# Patient Record
Sex: Female | Born: 1995 | Race: White | Hispanic: Yes | Marital: Married | Smoking: Former smoker
Health system: Southern US, Community
[De-identification: ages and names within clinical notes are randomized; demographics above are authoritative.]

## PROBLEM LIST (undated history)

## (undated) ENCOUNTER — Inpatient Hospital Stay (HOSPITAL_COMMUNITY): Payer: Self-pay

## (undated) ENCOUNTER — Ambulatory Visit (HOSPITAL_COMMUNITY): Disposition: A | Discharge status: Home / Self Care | Payer: Medicaid Other

## (undated) DIAGNOSIS — F913 Oppositional defiant disorder: Secondary | ICD-10-CM

## (undated) DIAGNOSIS — F4310 Post-traumatic stress disorder, unspecified: Secondary | ICD-10-CM

## (undated) DIAGNOSIS — Z8759 Personal history of other complications of pregnancy, childbirth and the puerperium: Secondary | ICD-10-CM

## (undated) DIAGNOSIS — O139 Gestational [pregnancy-induced] hypertension without significant proteinuria, unspecified trimester: Secondary | ICD-10-CM

## (undated) DIAGNOSIS — F419 Anxiety disorder, unspecified: Secondary | ICD-10-CM

## (undated) DIAGNOSIS — F509 Eating disorder, unspecified: Secondary | ICD-10-CM

## (undated) DIAGNOSIS — N200 Calculus of kidney: Secondary | ICD-10-CM

## (undated) DIAGNOSIS — T7840XA Allergy, unspecified, initial encounter: Secondary | ICD-10-CM

## (undated) DIAGNOSIS — R569 Unspecified convulsions: Secondary | ICD-10-CM

## (undated) DIAGNOSIS — J45909 Unspecified asthma, uncomplicated: Secondary | ICD-10-CM

## (undated) DIAGNOSIS — N730 Acute parametritis and pelvic cellulitis: Secondary | ICD-10-CM

## (undated) DIAGNOSIS — F909 Attention-deficit hyperactivity disorder, unspecified type: Secondary | ICD-10-CM

## (undated) DIAGNOSIS — G40909 Epilepsy, unspecified, not intractable, without status epilepticus: Secondary | ICD-10-CM

## (undated) DIAGNOSIS — F988 Other specified behavioral and emotional disorders with onset usually occurring in childhood and adolescence: Secondary | ICD-10-CM

## (undated) DIAGNOSIS — G43909 Migraine, unspecified, not intractable, without status migrainosus: Secondary | ICD-10-CM

## (undated) DIAGNOSIS — R51 Headache: Secondary | ICD-10-CM

## (undated) HISTORY — DX: Epilepsy, unspecified, not intractable, without status epilepticus: G40.909

## (undated) HISTORY — DX: Gestational (pregnancy-induced) hypertension without significant proteinuria, unspecified trimester: O13.9

## (undated) HISTORY — DX: Personal history of other complications of pregnancy, childbirth and the puerperium: Z87.59

## (undated) HISTORY — PX: DILATION AND CURETTAGE OF UTERUS: SHX78

## (undated) HISTORY — PX: OTHER SURGICAL HISTORY: SHX169

## (undated) HISTORY — PX: TONSILLECTOMY AND ADENOIDECTOMY: SUR1326

---
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
## 2016-01-05 ENCOUNTER — Emergency Department (HOSPITAL_COMMUNITY)
Admission: EM | Admit: 2016-01-05 | Discharge: 2016-01-06 | Disposition: A | Discharge status: Home / Self Care | Payer: Medicaid Other | Attending: Emergency Medicine | Admitting: Emergency Medicine

## 2016-01-05 ENCOUNTER — Encounter (HOSPITAL_COMMUNITY): Payer: Self-pay | Admitting: *Deleted

## 2016-01-05 DIAGNOSIS — Z3202 Encounter for pregnancy test, result negative: Secondary | ICD-10-CM | POA: Diagnosis not present

## 2016-01-05 DIAGNOSIS — Z87891 Personal history of nicotine dependence: Secondary | ICD-10-CM | POA: Insufficient documentation

## 2016-01-05 DIAGNOSIS — R42 Dizziness and giddiness: Secondary | ICD-10-CM | POA: Diagnosis not present

## 2016-01-05 DIAGNOSIS — F909 Attention-deficit hyperactivity disorder, unspecified type: Secondary | ICD-10-CM | POA: Insufficient documentation

## 2016-01-05 DIAGNOSIS — R200 Anesthesia of skin: Secondary | ICD-10-CM | POA: Insufficient documentation

## 2016-01-05 DIAGNOSIS — Z79899 Other long term (current) drug therapy: Secondary | ICD-10-CM | POA: Insufficient documentation

## 2016-01-05 DIAGNOSIS — R4182 Altered mental status, unspecified: Secondary | ICD-10-CM | POA: Diagnosis present

## 2016-01-05 DIAGNOSIS — F419 Anxiety disorder, unspecified: Secondary | ICD-10-CM | POA: Insufficient documentation

## 2016-01-05 DIAGNOSIS — Z91040 Latex allergy status: Secondary | ICD-10-CM | POA: Insufficient documentation

## 2016-01-05 DIAGNOSIS — R51 Headache: Secondary | ICD-10-CM | POA: Insufficient documentation

## 2016-01-05 DIAGNOSIS — Z7951 Long term (current) use of inhaled steroids: Secondary | ICD-10-CM | POA: Diagnosis not present

## 2016-01-05 DIAGNOSIS — R569 Unspecified convulsions: Secondary | ICD-10-CM | POA: Diagnosis not present

## 2016-01-05 DIAGNOSIS — J45909 Unspecified asthma, uncomplicated: Secondary | ICD-10-CM | POA: Diagnosis not present

## 2016-01-05 LAB — COMPREHENSIVE METABOLIC PANEL
ALT: 23 U/L (ref 14–54)
AST: 17 U/L (ref 15–41)
Albumin: 3.7 g/dL (ref 3.5–5.0)
Alkaline Phosphatase: 72 U/L (ref 38–126)
Anion gap: 8 (ref 5–15)
BUN: 11 mg/dL (ref 6–20)
CO2: 23 mmol/L (ref 22–32)
Calcium: 9.2 mg/dL (ref 8.9–10.3)
Chloride: 110 mmol/L (ref 101–111)
Creatinine, Ser: 0.78 mg/dL (ref 0.44–1.00)
GFR calc Af Amer: >60 mL/min (ref >60)
GFR calc non Af Amer: >60 mL/min (ref >60)
Glucose, Bld: 108 mg/dL — ABNORMAL HIGH (ref 65–99)
Potassium: 3.2 mmol/L — ABNORMAL LOW (ref 3.5–5.1)
Sodium: 141 mmol/L (ref 135–145)
Total Bilirubin: 0.3 mg/dL (ref 0.3–1.2)
Total Protein: 6.6 g/dL (ref 6.5–8.1)

## 2016-01-05 LAB — CBC WITH DIFFERENTIAL/PLATELET
Basophils Absolute: 0 10*3/uL (ref 0.0–0.1)
Basophils Relative: 0 %
Eosinophils Absolute: 0.2 10*3/uL (ref 0.0–0.7)
Eosinophils Relative: 2 %
HCT: 39.1 % (ref 36.0–46.0)
Hemoglobin: 13.1 g/dL (ref 12.0–15.0)
Lymphocytes Relative: 42 %
Lymphs Abs: 4.2 10*3/uL — ABNORMAL HIGH (ref 0.7–4.0)
MCH: 29.8 pg (ref 26.0–34.0)
MCHC: 33.5 g/dL (ref 30.0–36.0)
MCV: 89.1 fL (ref 78.0–100.0)
Monocytes Absolute: 0.8 10*3/uL (ref 0.1–1.0)
Monocytes Relative: 8 %
Neutro Abs: 4.7 10*3/uL (ref 1.7–7.7)
Neutrophils Relative %: 48 %
Platelets: 259 10*3/uL (ref 150–400)
RBC: 4.39 MIL/uL (ref 3.87–5.11)
RDW: 12.1 % (ref 11.5–15.5)
WBC: 10 10*3/uL (ref 4.0–10.5)

## 2016-01-05 MED ORDER — LEVETIRACETAM IN NACL 1000 MG/100ML IV SOLN
1000.0000 mg | Freq: Once | INTRAVENOUS | Status: AC
  Administered 2016-01-06: 1000 mg via INTRAVENOUS
  Filled 2016-01-05: qty 100

## 2016-01-05 MED ORDER — ACETAMINOPHEN 500 MG PO TABS
1000.0000 mg | ORAL_TABLET | Freq: Once | ORAL | Status: AC
  Administered 2016-01-06: 1000 mg via ORAL
  Filled 2016-01-05: qty 2

## 2016-01-05 MED ORDER — SODIUM CHLORIDE 0.9 % IV BOLUS (SEPSIS)
1000.0000 mL | Freq: Once | INTRAVENOUS | Status: AC
  Administered 2016-01-05: 1000 mL via INTRAVENOUS

## 2016-01-05 NOTE — ED Provider Notes (Signed)
By signing my name below, I, Erin Krueger, attest that this documentation has been prepared under the direction and in the presence of [employer], DO. Electronically Signed: Linus Krueger, ED Scribe. 01/05/2016. 11:06 PM.  TIME SEEN: 11:33 PM  CHIEF COMPLAINT:  Chief Complaint  Patient presents with  . Altered Mental Status   HPI: HPI Comments: Erin Krueger here via EMS is a 20 y.o. female with a PMHx of asthma and seizure who presents to the Emergency Department for an evaluation of an altered mental status. Pt c/o a headache and left facial and leg numbness. She reports her numbness is normal after having a seizure. No weakness.  C/o diffuse throbbing mild headache and lightheadedness which is also normal for her. Pt states she sees a neurologist in [place] for her seizures. She states she had a total of 3 seizures this year. Pt reports she was taking an unknown dose of Keppra until she ran out of it 2 months ago. Pt lives with her sister, Erin Krueger and her boyfriend. Pt has been off of Wellbutrin for "a long time." Pt denies any fever, vomiting, diarrhea, nausea, cough. Pt denies  pregnancy. She is sexually active and uses protection. Pt has a Nexplanon that "ran out" in January 2017. States she is not driving.   ROS: See HPI Constitutional: no fever  Eyes: no drainage  ENT: no runny nose   Cardiovascular:  no chest pain  Resp: no SOB  GI: no vomiting GU: no dysuria Integumentary: no rash  Allergy: no hives  Musculoskeletal: no leg swelling  Neurological: no slurred speech ROS otherwise negative  PAST MEDICAL HISTORY/PAST SURGICAL HISTORY:  Past Medical History  Diagnosis Date  . ADHD (attention deficit hyperactivity disorder)   . PTSD (post-traumatic stress disorder)   . ODD (oppositional defiant disorder)   . Asthma   . Anxiety   . Allergy   . Eating disorder   . Headache(784.0)     MEDICATIONS:  Prior to Admission medications   Medication Sig Start Date  End Date Taking? Authorizing Provider  acetaminophen (TYLENOL) 500 MG tablet Take 1,500 mg by mouth every 6 (six) hours as needed for mild pain.    Historical Provider, MD  albuterol (PROVENTIL HFA;VENTOLIN HFA) 108 (90 BASE) MCG/ACT inhaler Inhale 2 puffs into the lungs every 6 (six) hours as needed for wheezing. Patient may resume home supply. 10/11/14   Gwyneth Sprout, MD  beclomethasone (QVAR) 80 MCG/ACT inhaler Inhale 1 puff into the lungs 2 (two) times daily. 10/11/14   Gwyneth Sprout, MD  buPROPion (WELLBUTRIN XL) 300 MG 24 hr tablet Take 1 tablet (300 mg total) by mouth daily. Patient not taking: Reported on 03/30/2015 10/11/14   Gwyneth Sprout, MD  cetirizine (ZYRTEC) 10 MG tablet Take 1 tablet (10 mg total) by mouth at bedtime. Patient not taking: Reported on 03/30/2015 10/11/14   Gwyneth Sprout, MD  EPINEPHrine 0.3 mg/0.3 mL IJ SOAJ injection Inject 0.3 mg into the muscle as needed (Bee Stings).    Historical Provider, MD  etodolac (LODINE) 500 MG tablet Take 1 tablet (500 mg total) by mouth every evening. Patient not taking: Reported on 03/30/2015 10/11/14   Gwyneth Sprout, MD  famotidine (PEPCID) 20 MG tablet Take 1 tablet (20 mg total) by mouth 2 (two) times daily. 03/26/15   Devoria Albe, MD  hydrocortisone cream 1 % Apply 1 application topically 2 (two) times daily as needed for itching.    Historical Provider, MD  hydrOXYzine (ATARAX/VISTARIL) 50 MG  tablet Take 2 tablets (100 mg total) by mouth at bedtime. Patient taking differently: Take 50 mg by mouth at bedtime.  10/11/14   Gwyneth Sprout, MD  ibuprofen (ADVIL,MOTRIN) 600 MG tablet Take 1 tablet (600 mg total) by mouth every 8 (eight) hours as needed. 03/31/15   Azalia Bilis, MD  paliperidone (INVEGA) 6 MG 24 hr tablet Take 1 tablet (6 mg total) by mouth daily. 10/11/14   Gwyneth Sprout, MD  predniSONE (DELTASONE) 20 MG tablet Take 3 po QD x 3d , then 2 po QD x 3d then 1 po QD x 3d 03/26/15   Devoria Albe, MD  topiramate (TOPAMAX)  100 MG tablet Take 2 tablets (200 mg total) by mouth at bedtime. 10/11/14   Gwyneth Sprout, MD    ALLERGIES:  Allergies  Allergen Reactions  . Bee Venom Anaphylaxis  . Latex Rash    SOCIAL HISTORY:  Social History  Substance Use Topics  . Smoking status: Former Smoker    Quit date: 07/21/2013  . Smokeless tobacco: Not on file  . Alcohol Use: Yes     Comment: 3-4 glassed vodka/beer    FAMILY HISTORY: History reviewed. No pertinent family history.  EXAM: BP 123/84 mmHg  Pulse 85  Temp(Src) 99 F (37.2 C) (Oral)  Resp 14  Ht  (1.676 m)  Wt 205 lb (92.987 kg)  BMI 33.10 kg/m2  SpO2 98%   CONSTITUTIONAL: Alert and oriented to person but not place and time and responds appropriately to questions. Well-appearing; well-nourished, afebrile and nontoxic HEAD: Normocephalic, atraumatic EYES: Conjunctivae clear, PERRL ENT: normal nose; no rhinorrhea; moist mucous membranes; pharynx without lesions noted, no dental injury NECK: Supple, no meningismus, no LAD, ecchymosis to the right neck. Pt states is a "hickey"; no midline spinal tenderness or step-off or deformity CARD: RRR; S1 and S2 appreciated; no murmurs, no clicks, no rubs, no gallops RESP: Normal chest excursion without splinting or tachypnea; breath sounds clear and equal bilaterally; no wheezes, no rhonchi, no rales, no hypoxia or respiratory distress, no increased work of breathing ABD/GI: Normal bowel sounds; non-distended; soft, non-tender, no rebound, no guarding; no midline spinal tenderness or step-off or deformity BACK:  The back appears normal and is non-tender to palpation, there is no CVA tenderness EXT: Normal ROM in all joints; non-tender to palpation; no edema; normal capillary refill; no cyanosis    SKIN: Normal color for age and race; warm, superficial laceration to bottom lip NEURO: Moves all extremities equally, reports decreased sensation in her left lower cheek and left leg compared to the right. No  focal weakness on exam. No dysarthria or aphasia. Cranial nerves II through XII otherwise intact. PSYCH: The patient's mood and manner are appropriate. Grooming and personal hygiene are appropriate.  MEDICAL DECISION MAKING: Patient here with possible seizure. There is no family or witnesses present. She does appear post ictal. Reports a history of seizures and she thinks that she was on Keppra previously. States that she is no longer taking this medication because she ran out of it. States she is followed by neurologist in [place] but cannot remember their name. Complains of headache, dizziness, left lower face and left leg numbness. States this is typical for her after her seizures. States she is not driving. No obvious sign of trauma on exam. She does have some bruising to the right neck but states that this is a "hickey". We'll obtain labs, urine. Will give dose of IV Keppra in the emergency department. We'll try to  get in touch with patient's family. She is not currently on any medications that would lower her seizure threshold. Reports she's been off Wellbutrin for several months.  ED PROGRESS:  2:00 AM  Pt's boyfriend now at bedside. He states that tonight she complained of lip swelling and filling her throat was swelling. He thought she could've been having an allergic reaction. He did not see any seizure-like activity. He states that they called EMS for allergic reaction. Patient states that she thinks she had a seizure. States that the numbness in her left face and leg is improving. Only has a mild amount of tingling in the left foot. She is now oriented 3 without complaints. There is no wheezing or rash on exam. No urticaria. No angioedema. No stridor and normal phonation. Swallowing her own secretions without any difficulty. Labs are unremarkable other than mildly low potassium at 3.2 which we will replace. Urine shows no infection and she is not pregnant. I recommend placing patient on Keppra  500 mg twice a day and having her follow-up with her neurologist. Maryclare Labrador give her outpatient urology follow-up. Have discussed with her that she should not be driving and she understands. She has been monitored for 3 hours in the emergency department and again I see no sign of allergic reaction. She has no recent infectious symptoms. I suspect that this was a seizure likely triggered by the fact that she is noncompliant with her antiepileptics. Discussed return precautions. She verbalized understanding and is comfortable with this plan.    EKG Interpretation  Date/Time:  Monday January 06 2016 00:21:38 EST Ventricular Rate:  89 PR Interval:  204 QRS Duration: 105 QT Interval:  360 QTC Calculation: 438 R Axis:   75 Text Interpretation:  Sinus rhythm Borderline prolonged PR interval Borderline Q waves in inferior leads No significant change since last tracing Confirmed by Cora Brierley,  DO, Ariyan Brisendine ([id number]) on 01/06/2016 1:14:31 AM        I personally performed the services described in this documentation, which was scribed in my presence. The recorded information has been reviewed and is accurate.   Layla Maw Daishon Chui, DO 01/06/16 0205

## 2016-01-05 NOTE — ED Notes (Signed)
Pt brought in by [facility] for c/o altered loc; pt was found sitting on bed, pt has bite mark to lip and states she has seizures but does not take any medications

## 2016-01-06 LAB — POC URINE PREG, ED: Preg Test, Ur: NEGATIVE

## 2016-01-06 LAB — URINALYSIS, ROUTINE W REFLEX MICROSCOPIC
Bilirubin Urine: NEGATIVE
Glucose, UA: NEGATIVE mg/dL
Hgb urine dipstick: NEGATIVE
Ketones, ur: NEGATIVE mg/dL
Leukocytes, UA: NEGATIVE
Nitrite: NEGATIVE
Protein, ur: NEGATIVE mg/dL
Specific Gravity, Urine: 1.02 (ref 1.005–1.030)
pH: 6.5 (ref 5.0–8.0)

## 2016-01-06 MED ORDER — LEVETIRACETAM 500 MG PO TABS: 500.0000 mg | ORAL_TABLET | Freq: Two times a day (BID) | ORAL | Status: DC

## 2016-01-06 MED ORDER — POTASSIUM CHLORIDE CRYS ER 20 MEQ PO TBCR
40.0000 meq | EXTENDED_RELEASE_TABLET | Freq: Once | ORAL | Status: AC
  Administered 2016-01-06: 40 meq via ORAL
  Filled 2016-01-06: qty 2

## 2016-01-06 NOTE — Discharge Instructions (Signed)
° °  No Driving!  Follow up with your neurologist. Make an appointment this week.   Seizure, Adult A seizure is abnormal electrical activity in the brain. Seizures usually last from 30 seconds to 2 minutes. There are various types of seizures. Before a seizure, you may have a warning sensation (aura) that a seizure is about to occur. An aura may include the following symptoms:   Fear or anxiety.  Nausea.  Feeling like the room is spinning (vertigo).  Vision changes, such as seeing flashing lights or spots. Common symptoms during a seizure include:  A change in attention or behavior (altered mental status).  Convulsions with rhythmic jerking movements.  Drooling.  Rapid eye movements.  Grunting.  Loss of bladder and bowel control.  Bitter taste in the mouth.  Tongue biting. After a seizure, you may feel confused and sleepy. You may also have an injury resulting from convulsions during the seizure. HOME CARE INSTRUCTIONS   If you are given medicines, take them exactly as prescribed by your health care provider.  Keep all follow-up appointments as directed by your health care provider.  Do not swim or drive or engage in risky activity during which a seizure could cause further injury to you or others until your health care provider says it is OK.  Get adequate rest.  Teach friends and family what to do if you have a seizure. They should:  Lay you on the ground to prevent a fall.  Put a cushion under your head.  Loosen any tight clothing around your neck.  Turn you on your side. If vomiting occurs, this helps keep your airway clear.  Stay with you until you recover.  Know whether or not you need emergency care. SEEK IMMEDIATE MEDICAL CARE IF:  The seizure lasts longer than 5 minutes.  The seizure is severe or you do not wake up immediately after the seizure.  You have an altered mental status after the seizure.  You are having more frequent or worsening  seizures. Someone should drive you to the emergency department or call local emergency services (911 in [place] MAKE SURE YOU:  Understand these instructions.  Will watch your condition.  Will get help right away if you are not doing well or get worse.   This information is not intended to replace advice given to you by your health care provider. Make sure you discuss any questions you have with your health care provider.   Document Released: 11/06/2000 Document Revised: 11/30/2014 Document Reviewed: 06/21/2013 Elsevier Interactive Patient Education [employer]

## 2016-01-07 ENCOUNTER — Encounter (HOSPITAL_COMMUNITY): Payer: Self-pay | Admitting: Emergency Medicine

## 2016-01-07 ENCOUNTER — Emergency Department (HOSPITAL_COMMUNITY): Payer: Medicaid Other

## 2016-01-07 ENCOUNTER — Observation Stay (HOSPITAL_COMMUNITY)
Admission: EM | Admit: 2016-01-07 | Discharge: 2016-01-09 | Disposition: A | Discharge status: Home / Self Care | Payer: Medicaid Other | Attending: Family Medicine | Admitting: Family Medicine

## 2016-01-07 DIAGNOSIS — F902 Attention-deficit hyperactivity disorder, combined type: Secondary | ICD-10-CM | POA: Diagnosis not present

## 2016-01-07 DIAGNOSIS — R51 Headache: Secondary | ICD-10-CM | POA: Insufficient documentation

## 2016-01-07 DIAGNOSIS — Z79899 Other long term (current) drug therapy: Secondary | ICD-10-CM | POA: Diagnosis not present

## 2016-01-07 DIAGNOSIS — R569 Unspecified convulsions: Principal | ICD-10-CM | POA: Insufficient documentation

## 2016-01-07 DIAGNOSIS — F912 Conduct disorder, adolescent-onset type: Secondary | ICD-10-CM | POA: Diagnosis not present

## 2016-01-07 DIAGNOSIS — F509 Eating disorder, unspecified: Secondary | ICD-10-CM | POA: Insufficient documentation

## 2016-01-07 DIAGNOSIS — Z87891 Personal history of nicotine dependence: Secondary | ICD-10-CM | POA: Diagnosis not present

## 2016-01-07 DIAGNOSIS — Z91040 Latex allergy status: Secondary | ICD-10-CM | POA: Diagnosis not present

## 2016-01-07 DIAGNOSIS — J45909 Unspecified asthma, uncomplicated: Secondary | ICD-10-CM | POA: Diagnosis not present

## 2016-01-07 DIAGNOSIS — F913 Oppositional defiant disorder: Secondary | ICD-10-CM | POA: Insufficient documentation

## 2016-01-07 DIAGNOSIS — G40919 Epilepsy, unspecified, intractable, without status epilepticus: Secondary | ICD-10-CM

## 2016-01-07 DIAGNOSIS — F419 Anxiety disorder, unspecified: Secondary | ICD-10-CM | POA: Diagnosis not present

## 2016-01-07 DIAGNOSIS — F4310 Post-traumatic stress disorder, unspecified: Secondary | ICD-10-CM | POA: Diagnosis present

## 2016-01-07 DIAGNOSIS — F909 Attention-deficit hyperactivity disorder, unspecified type: Secondary | ICD-10-CM | POA: Diagnosis not present

## 2016-01-07 DIAGNOSIS — G40909 Epilepsy, unspecified, not intractable, without status epilepticus: Secondary | ICD-10-CM | POA: Diagnosis not present

## 2016-01-07 DIAGNOSIS — Z9119 Patient's noncompliance with other medical treatment and regimen: Secondary | ICD-10-CM | POA: Diagnosis not present

## 2016-01-07 LAB — CBC WITH DIFFERENTIAL/PLATELET
Basophils Absolute: 0 10*3/uL (ref 0.0–0.1)
Basophils Relative: 0 %
Eosinophils Absolute: 0.3 10*3/uL (ref 0.0–0.7)
Eosinophils Relative: 3 %
HCT: 43.7 % (ref 36.0–46.0)
Hemoglobin: 14.4 g/dL (ref 12.0–15.0)
Lymphocytes Relative: 29 %
Lymphs Abs: 2.6 10*3/uL (ref 0.7–4.0)
MCH: 29.8 pg (ref 26.0–34.0)
MCHC: 33 g/dL (ref 30.0–36.0)
MCV: 90.3 fL (ref 78.0–100.0)
Monocytes Absolute: 0.7 10*3/uL (ref 0.1–1.0)
Monocytes Relative: 8 %
Neutro Abs: 5.4 10*3/uL (ref 1.7–7.7)
Neutrophils Relative %: 60 %
Platelets: 277 10*3/uL (ref 150–400)
RBC: 4.84 MIL/uL (ref 3.87–5.11)
RDW: 12.2 % (ref 11.5–15.5)
WBC: 9 10*3/uL (ref 4.0–10.5)

## 2016-01-07 LAB — BASIC METABOLIC PANEL
Anion gap: 9 (ref 5–15)
BUN: 12 mg/dL (ref 6–20)
CO2: 26 mmol/L (ref 22–32)
Calcium: 9.5 mg/dL (ref 8.9–10.3)
Chloride: 106 mmol/L (ref 101–111)
Creatinine, Ser: 0.67 mg/dL (ref 0.44–1.00)
GFR calc Af Amer: >60 mL/min (ref >60)
GFR calc non Af Amer: >60 mL/min (ref >60)
Glucose, Bld: 89 mg/dL (ref 65–99)
Potassium: 3.8 mmol/L (ref 3.5–5.1)
Sodium: 141 mmol/L (ref 135–145)

## 2016-01-07 LAB — ETHANOL: Alcohol, Ethyl (B): <5 mg/dL (ref <5)

## 2016-01-07 LAB — RAPID URINE DRUG SCREEN, HOSP PERFORMED
Amphetamines: NOT DETECTED
Barbiturates: NOT DETECTED
Benzodiazepines: NOT DETECTED
Cocaine: NOT DETECTED
Opiates: NOT DETECTED
Tetrahydrocannabinol: NOT DETECTED

## 2016-01-07 LAB — TSH: TSH: 0.512 u[IU]/mL (ref 0.350–4.500)

## 2016-01-07 IMAGING — CT CT HEAD W/O CM
1 of 2 series · 16 of 30 positions shown, 20 images · non-contrast
Comparison: [DATE]

CLINICAL DATA: Three seizures earlier today

EXAM:
CT HEAD WITHOUT CONTRAST
TECHNIQUE: Contiguous axial images were obtained from the base of the skull
through the vertex without intravenous contrast.

[Series 2: headseq 4.8 h37s · axial · 0.43mm/px · z∈[+256,+406]mm · 16 of 35 slices shown, 20 images]
[im 2/35  brain]
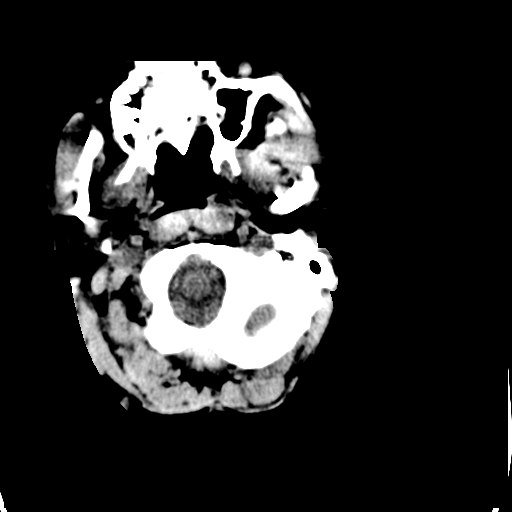
[im 2/35  bone]
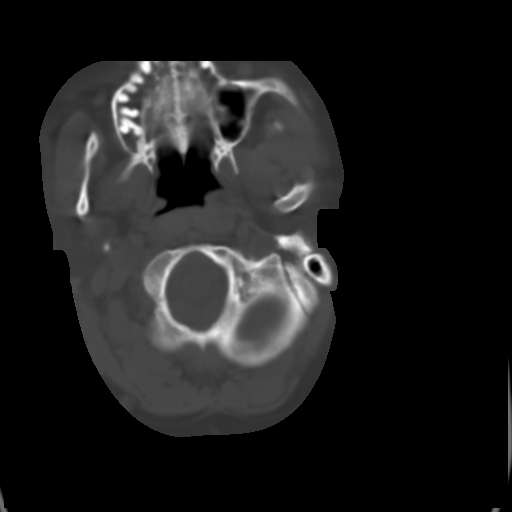
[im 4/35  brain]
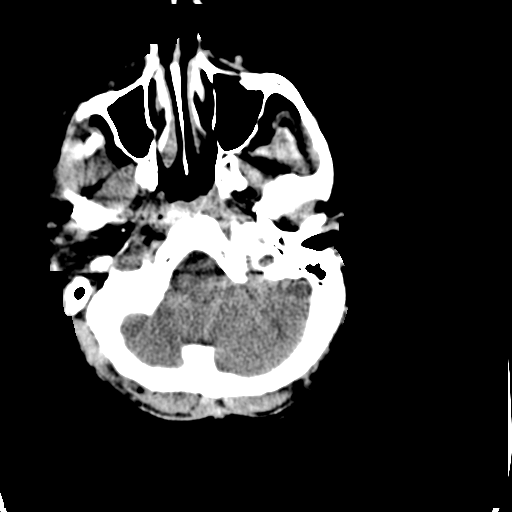
[im 6/35  brain]
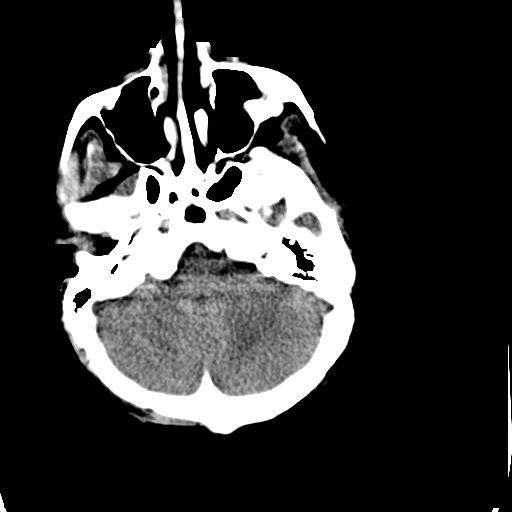
[im 8/35  brain]
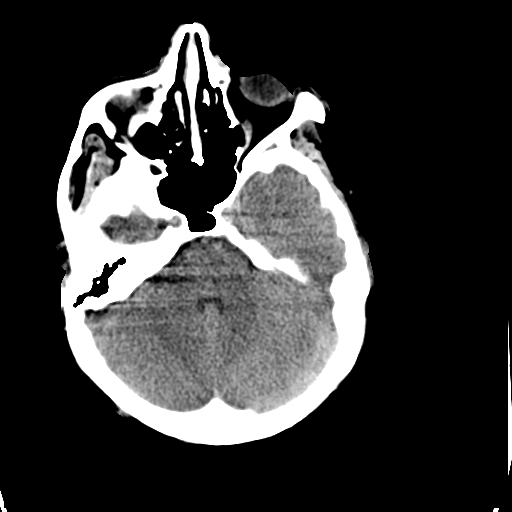
[im 11/35  brain]
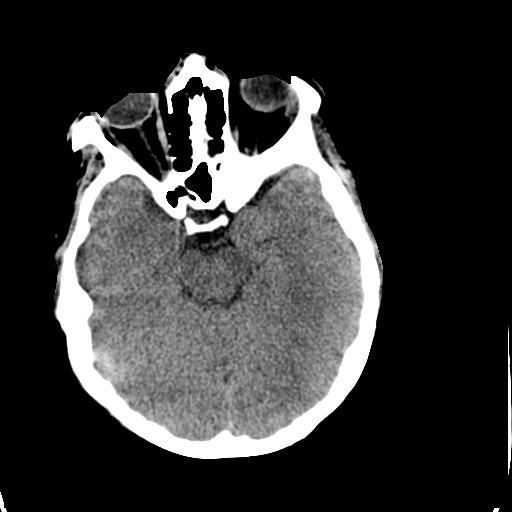
[im 11/35  bone]
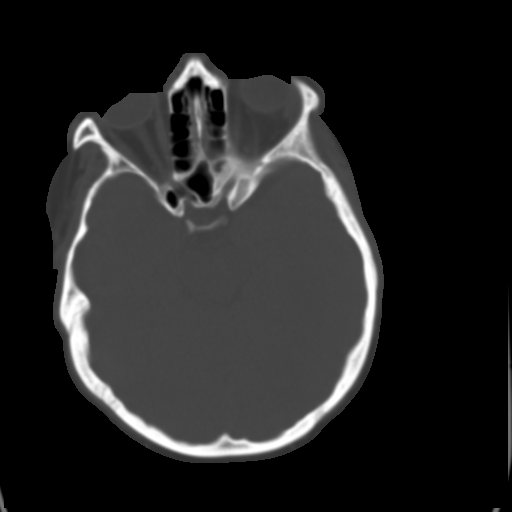
[im 13/35  brain]
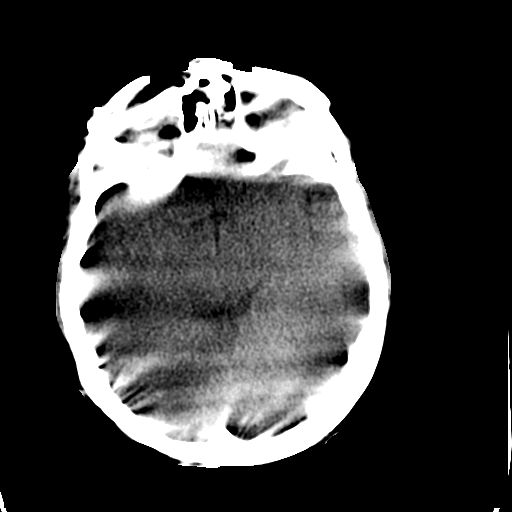
[im 15/35  brain]
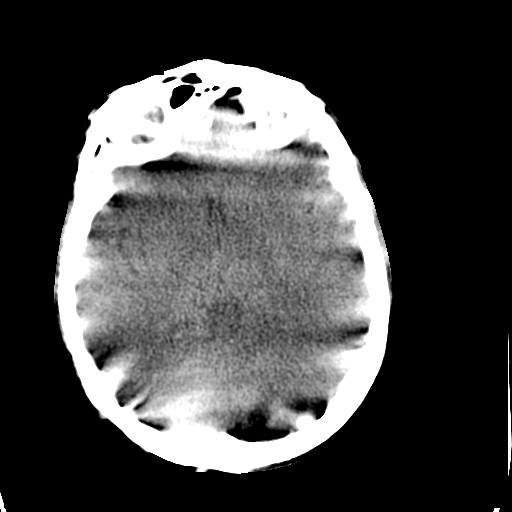
[im 17/35  brain]
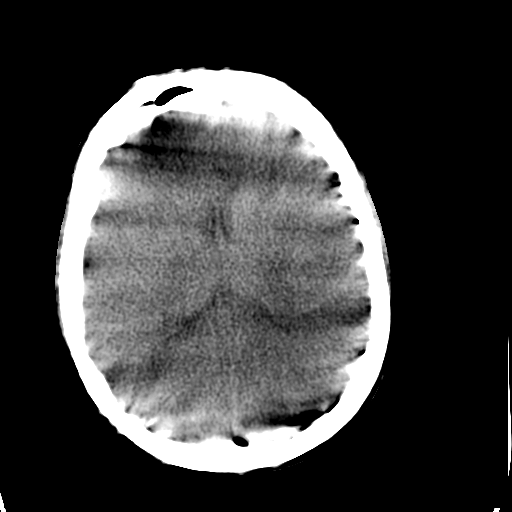
[im 18/35  brain]
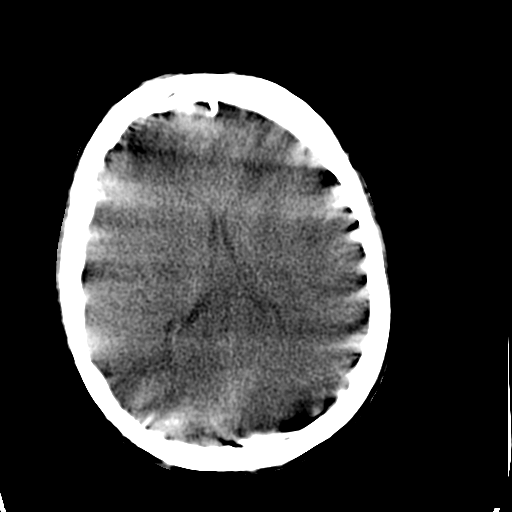
[im 18/35  bone]
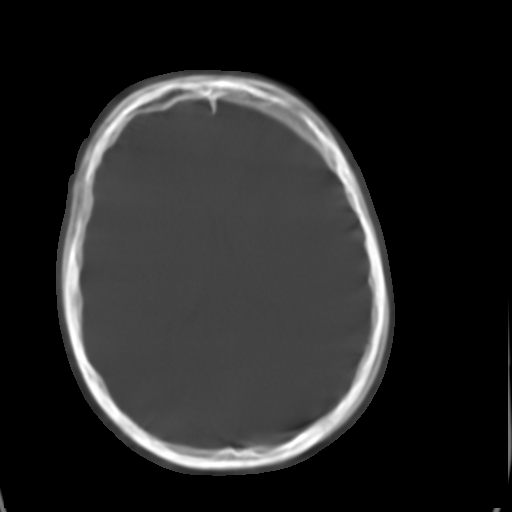
[im 20/35  brain]
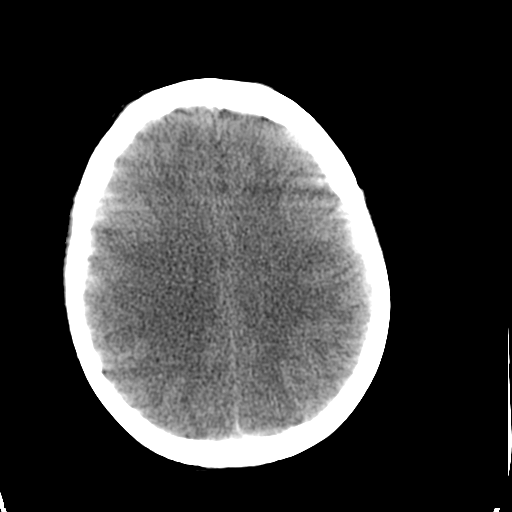
[im 22/35  brain]
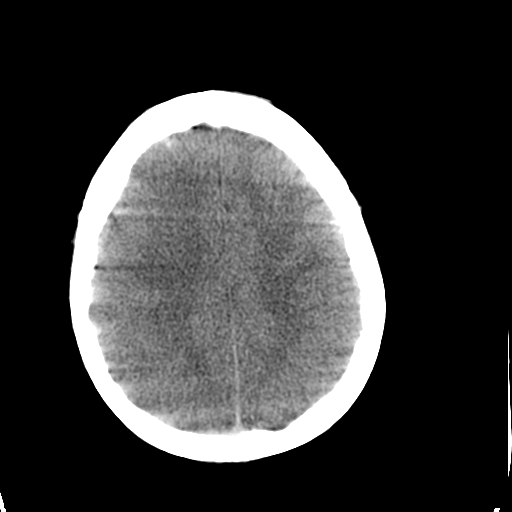
[im 24/35  brain]
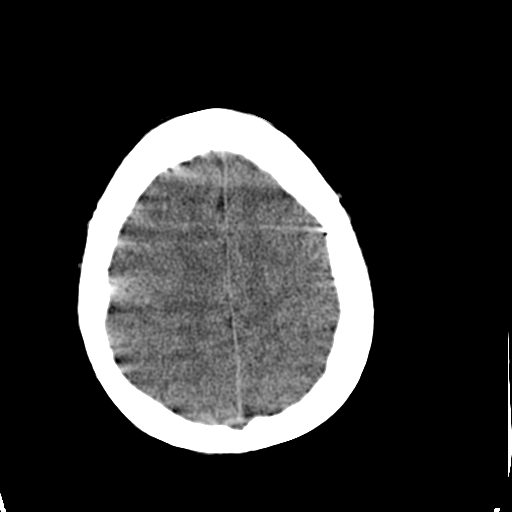
[im 27/35  brain]
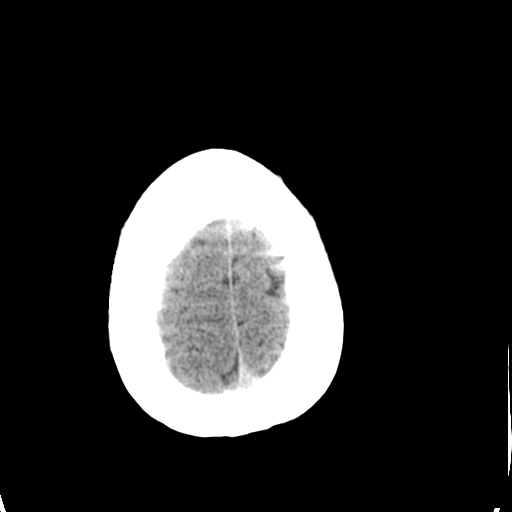
[im 27/35  bone]
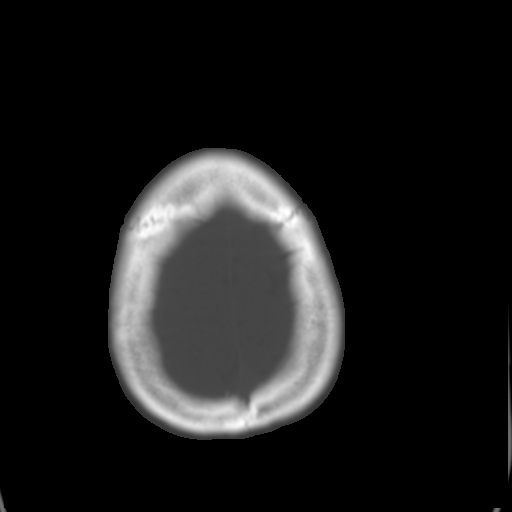
[im 29/35  brain]
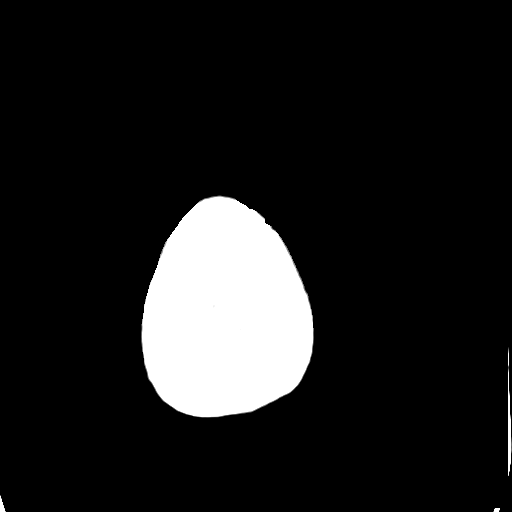
[im 31/35  brain]
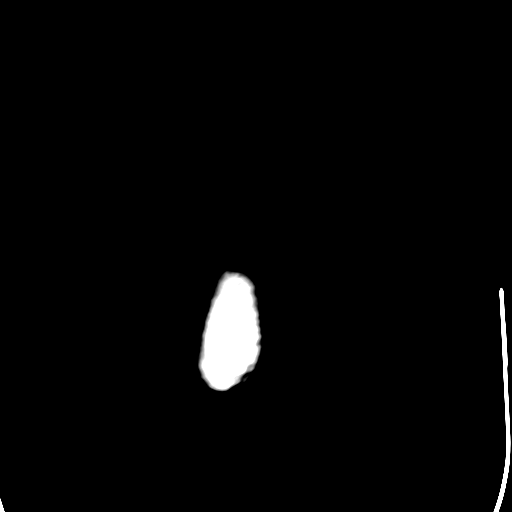
[im 33/35  brain]
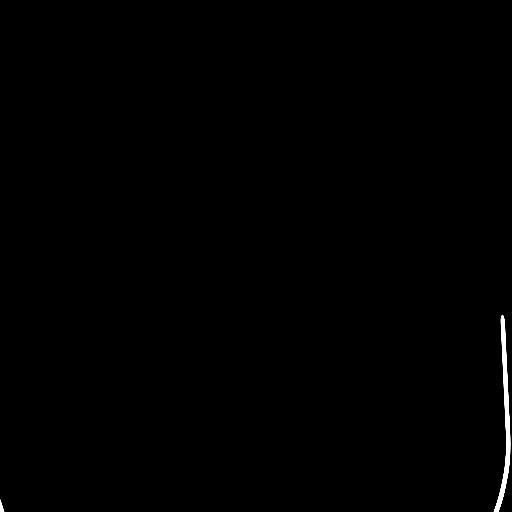

[16 of 30 positions shown; findings below may reference images not displayed]

FINDINGS: The ventricles are normal in size and configuration. There is no
intracranial mass, hemorrhage, extra-axial fluid collection, or
midline shift. Gray-white compartments appear normal. No acute
infarct evident. Bony calvarium appears intact. The mastoid air
cells are clear. There is a small retention cyst in each maxillary
antrum. No intraorbital lesions are apparent.
IMPRESSION: Mild maxillary sinus disease bilaterally. No intracranial mass,
hemorrhage, or extra-axial fluid collection. Gray-white compartments
appear normal. No evidence of acute infarct.

## 2016-01-07 MED ORDER — LORAZEPAM 2 MG/ML IJ SOLN
INTRAMUSCULAR | Status: AC
  Filled 2016-01-07: qty 1

## 2016-01-07 MED ORDER — BECLOMETHASONE DIPROPIONATE 80 MCG/ACT IN AERS: 1.0000 | INHALATION_SPRAY | Freq: Two times a day (BID) | RESPIRATORY_TRACT | Status: DC

## 2016-01-07 MED ORDER — SODIUM CHLORIDE 0.9 % IV BOLUS (SEPSIS)
1000.0000 mL | Freq: Once | INTRAVENOUS | Status: AC
  Administered 2016-01-07: 1000 mL via INTRAVENOUS

## 2016-01-07 MED ORDER — ENOXAPARIN SODIUM 40 MG/0.4ML ~~LOC~~ SOLN
40.0000 mg | SUBCUTANEOUS | Status: DC
  Filled 2016-01-07: qty 0.4

## 2016-01-07 MED ORDER — LEVETIRACETAM IN NACL 1000 MG/100ML IV SOLN
1000.0000 mg | Freq: Once | INTRAVENOUS | Status: AC
  Administered 2016-01-07: 1000 mg via INTRAVENOUS
  Filled 2016-01-07: qty 100

## 2016-01-07 MED ORDER — LEVETIRACETAM 500 MG PO TABS
500.0000 mg | ORAL_TABLET | Freq: Two times a day (BID) | ORAL | Status: DC
  Administered 2016-01-08 – 2016-01-09 (×3): 500 mg via ORAL
  Filled 2016-01-07 (×4): qty 1

## 2016-01-07 MED ORDER — SODIUM CHLORIDE 0.9 % IV SOLN
Freq: Once | INTRAVENOUS | Status: AC
  Administered 2016-01-07: 19:00:00 via INTRAVENOUS

## 2016-01-07 MED ORDER — ZIPRASIDONE MESYLATE 20 MG IM SOLR
10.0000 mg | Freq: Once | INTRAMUSCULAR | Status: AC
  Administered 2016-01-07: 10 mg via INTRAMUSCULAR
  Filled 2016-01-07: qty 20

## 2016-01-07 MED ORDER — LORAZEPAM 2 MG/ML IJ SOLN
2.0000 mg | Freq: Once | INTRAMUSCULAR | Status: AC
  Administered 2016-01-07: 2 mg via INTRAVENOUS

## 2016-01-07 MED ORDER — BUDESONIDE 0.25 MG/2ML IN SUSP
0.2500 mg | Freq: Two times a day (BID) | RESPIRATORY_TRACT | Status: DC
  Administered 2016-01-08 – 2016-01-09 (×3): 0.25 mg via RESPIRATORY_TRACT
  Filled 2016-01-07 (×4): qty 2

## 2016-01-07 MED ORDER — SODIUM CHLORIDE 0.9% FLUSH
3.0000 mL | Freq: Two times a day (BID) | INTRAVENOUS | Status: DC
  Administered 2016-01-08 (×2): 3 mL via INTRAVENOUS

## 2016-01-07 MED ORDER — STERILE WATER FOR INJECTION IJ SOLN
INTRAMUSCULAR | Status: AC
  Filled 2016-01-07: qty 10

## 2016-01-07 NOTE — ED Notes (Signed)
Asked patient for urine sample. Patient stated she did not have to urinate. Patient placed on bed pan. Patient pulled bed pan away and urinated in the bed.

## 2016-01-07 NOTE — ED Provider Notes (Addendum)
CSN: [id number]     Arrival date & time 01/07/16  1507 History   First MD Initiated Contact with Patient 01/07/16 1521     Chief Complaint  Patient presents with  . Seizures     (Consider location/radiation/quality/duration/timing/severity/associated sxs/prior Treatment) HPI..... Level V caveat for seizure disorder. Most of history obtained from sister. Second emergency department visit in the past 36 hours for "seizures". History is rather sketchy; sister reports a history of a seizure disorder and noncompliance with medications/doctor visits. She is presently on no anti-seizure meds. Patient had a tonic-clonic seizure just prior to transport via EMS. In the ED, she has had several other seizure-like movements with associated sleepiness after seizure. No reported prodromal illnesses. She has several mental health diagnoses.  Past Medical History  Diagnosis Date  . ADHD (attention deficit hyperactivity disorder)   . PTSD (post-traumatic stress disorder)   . ODD (oppositional defiant disorder)   . Asthma   . Anxiety   . Allergy   . Eating disorder   . [id number](478.2)    Past Surgical History  Procedure Laterality Date  . Tonsillectomy     History reviewed. No pertinent family history. Social History  Substance Use Topics  . Smoking status: Former Smoker    Quit date: 07/21/2013  . Smokeless tobacco: None  . Alcohol Use: Yes     Comment: 3-4 glassed vodka/beer   OB History    No data available     Review of Systems  All other systems reviewed and are negative.     Allergies  Bee venom and Latex  Home Medications   Prior to Admission medications   Medication Sig Start Date End Date Taking? Authorizing Provider  EPINEPHrine 0.3 mg/0.3 mL IJ SOAJ injection Inject 0.3 mg into the muscle as needed (Bee Stings).   Yes Historical Provider, MD  levETIRAcetam (KEPPRA) 500 MG tablet Take 1 tablet (500 mg total) by mouth 2 (two) times daily. 01/06/16  Yes Kristen N Ward, DO   acetaminophen (TYLENOL) 500 MG tablet Take 1 tablet (500 mg total) by mouth every 6 (six) hours as needed for mild pain. 01/09/16   Standley Brooking, MD  albuterol (PROVENTIL HFA;VENTOLIN HFA) 108 (90 Base) MCG/ACT inhaler Inhale 2 puffs into the lungs every 6 (six) hours as needed for wheezing. Patient may resume home supply. 01/09/16   Standley Brooking, MD  beclomethasone (QVAR) 80 MCG/ACT inhaler Inhale 1 puff into the lungs 2 (two) times daily. Patient not taking: Reported on 01/07/2016 10/11/14   Gwyneth Sprout, MD   BP 126/63 mmHg  Pulse 62  Temp(Src) 98.3 F (36.8 C) (Oral)  Resp 18  Ht  (1.676 m)  SpO2 98% Physical Exam  Constitutional:  Disheveled appearing  HENT:  Head: Normocephalic and atraumatic.  Eyes: Conjunctivae and EOM are normal. Pupils are equal, round, and reactive to light.  Neck: Normal range of motion. Neck supple.  Cardiovascular: Normal rate and regular rhythm.   Pulmonary/Chest: Effort normal and breath sounds normal.  Abdominal: Soft. Bowel sounds are normal.  Musculoskeletal: Normal range of motion.  Neurological:  No obvious seizures.  Patient is smacking her lips and talking nonsensically, but she immediately clears to normal mentation. No tonic-clonic activity noted.  Skin: Skin is warm and dry.  Psychiatric: She has a normal mood and affect. Her behavior is normal.  Nursing note and vitals reviewed.   ED Course  Procedures (including critical care time) Labs Review Labs Reviewed  BASIC METABOLIC PANEL  CBC WITH DIFFERENTIAL/PLATELET  ETHANOL  URINE RAPID DRUG SCREEN, HOSP PERFORMED  TSH    Imaging Review No results found. I have personally reviewed and evaluated these images and lab results as part of my medical decision-making.   EKG Interpretation   Date/Time:  Tuesday January 07 2016 15:23:18 EST Ventricular Rate:  86 PR Interval:  156 QRS Duration: 95 QT Interval:  352 QTC Calculation: 421 R Axis:   68 Text  Interpretation:  Sinus rhythm Confirmed by Kiyomi Pallo  MD, Damonica Chopra ([id number]) on  01/07/2016 8:06:14 PM     CRITICAL CARE Performed by: Donnetta Hutching Total critical care time: 30 minutes Critical care time was exclusive of separately billable procedures and treating other patients. Critical care was necessary to treat or prevent imminent or life-threatening deterioration. Critical care was time spent personally by me on the following activities: development of treatment plan with patient and/or surrogate as well as nursing, discussions with consultants, evaluation of patient's response to treatment, examination of patient, obtaining history from patient or surrogate, ordering and performing treatments and interventions, ordering and review of laboratory studies, ordering and reviSeizure versus pseudoseizure.ew of radiographic studies, pulse oximetry and re-evaluation of patient's condition. MDM   Final diagnoses:  Seizure (HCC)    Seizure versus pseudoseizure. Patient has several mental health diagnoses. Discussed with neurologist and hospitalist. Dr. Conley Rolls will evaluate.  2000:   Patient has attempted to leave the emergency department. I didn't think was safe for her to be discharged. I do not think she is thinking clearly. ? suicidal ideation. I filled out involuntary commitment papers. Patient will be admitted to general medicine. TSS consult pending.   Donnetta Hutching, MD 01/07/16 1844  Donnetta Hutching, MD 01/07/16 6045  Donnetta Hutching, MD 01/07/16 2009  Donnetta Hutching, MD 01/21/16 [phone]

## 2016-01-07 NOTE — ED Notes (Signed)
Patient arrives via EMS from home with c/o 3 witnessed seizures today. Patient arrives somnolent. EMS states constricted pupils and pushed Narcan. No seizure activity since narcan given.  Patient seen 01/05/16 for 1st seizure activity. Given Keppra to start. Unknown if she has been taking Keppra due to patient condition.

## 2016-01-07 NOTE — BH Assessment (Signed)
Writer spoke with Erin Krueger to request TTS cart placement for assessment. Writer was informed that pt is engaging in disruptive behaviors and unable to be assessed at this time. Writer requested consult be taken out and re-entered when pt is able to engage in assessment.

## 2016-01-07 NOTE — ED Notes (Signed)
Patient actively seizing at this time. Dr Adriana Simas at bedside. Verbal order for 2 mg Ativan IV.
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
## 2016-01-07 NOTE — ED Notes (Signed)
Patient present with child-like behavior after seizures, pulling off leads, pulling at IV saying "it hurts", I removed the IV for safety reasons.  Informed EDP Adriana Simas that patient was trying to leave, he was working on Agricultural consultant, Loss adjuster, chartered at bedside for consult.

## 2016-01-07 NOTE — ED Notes (Signed)
Pt with intermittent seizure like activity. Sister at bedside. MD aware. Patient guards her face. Opens eyes and gazes around after seizure like activity.

## 2016-01-07 NOTE — H&P (Signed)
[facility] History and Physical  Erin Krueger RUE:[id number] DOB: [birth year]    PCP:   No PCP Per Patient   Chief Complaint:  Break through seizure.   HPI: Erin Krueger is an 20 y.o. female with hx of seizure since childhood, ADHD, PTSD, ODD, asthma, anxiety, currently living with her pregnant step sister, seen on Feb 12/17 for several seizures similar to her previous seizures, given IV Keppra and discharged on oral keppra, but she was having more episodes of seizures.  Her seizures was witnessed in the ER, and those today did not result in having LOC or postictal, but her previous ones seemed to be more generalized TC seizures.  Work up in the ER included a head CT, which was negative, and negative serologies to include normal CBC, LFTs, renal Fx tests, and electrolytes.  She was again given IV Keppra load, and hospitalist was asked to admit her for further evaluation of her seizures.   She said she was taking another kind of seizure medication for a long time, but her neurologist had recently taken her off.  EDP also consulted Dr Gerilyn Pilgrim, who will see her tomorrow in consultation.  She was placed on IVC as well, given she is not capable of making decision, and she was attempting to leave.   Rewiew of Systems:  Constitutional: Negative for malaise, fever and chills. No significant weight loss or weight gain Eyes: Negative for eye pain, redness and discharge, diplopia, visual changes, or flashes of light. ENMT: Negative for ear pain, hoarseness, nasal congestion, sinus pressure and sore throat. No headaches; tinnitus, drooling, or problem swallowing. Cardiovascular: Negative for chest pain, palpitations, diaphoresis, dyspnea and peripheral edema. ; No orthopnea, PND Respiratory: Negative for cough, hemoptysis, wheezing and stridor. No pleuritic chestpain. Gastrointestinal: Negative for nausea, vomiting, diarrhea, constipation, abdominal pain, melena, blood in stool, hematemesis,  jaundice and rectal bleeding.    Genitourinary: Negative for frequency, dysuria, incontinence,flank pain and hematuria; Musculoskeletal: Negative for back pain and neck pain. Negative for swelling and trauma.;  Skin: . Negative for pruritus, rash, abrasions, bruising and skin lesion.; ulcerations Neuro: Negative for headache, lightheadedness and neck stiffness. Negative for weakness, altered level of consciousness , altered mental status, extremity weakness, burning feet, involuntary movement, seizure and syncope.  Psych: negative for anxiety, depression, insomnia, tearfulness, panic attacks, hallucinations, paranoia, suicidal or homicidal ideation    Past Medical History  Diagnosis Date  . ADHD (attention deficit hyperactivity disorder)   . PTSD (post-traumatic stress disorder)   . ODD (oppositional defiant disorder)   . Asthma   . Anxiety   . Allergy   . Eating disorder   . JYNWGNFA(213.0)     Past Surgical History  Procedure Laterality Date  . Tonsillectomy      Medications:  HOME MEDS: Prior to Admission medications   Medication Sig Start Date End Date Taking? Authorizing Provider  acetaminophen (TYLENOL) 500 MG tablet Take 1,000 mg by mouth every 6 (six) hours as needed for mild pain.    Yes Historical Provider, MD  albuterol (PROVENTIL HFA;VENTOLIN HFA) 108 (90 BASE) MCG/ACT inhaler Inhale 2 puffs into the lungs every 6 (six) hours as needed for wheezing. Patient may resume home supply. 10/11/14  Yes Gwyneth Sprout, MD  EPINEPHrine 0.3 mg/0.3 mL IJ SOAJ injection Inject 0.3 mg into the muscle as needed (Bee Stings).   Yes Historical Provider, MD  levETIRAcetam (KEPPRA) 500 MG tablet Take 1 tablet (500 mg total) by mouth 2 (two) times daily. 01/06/16  Yes Baxter Hire  N Ward, DO  beclomethasone (QVAR) 80 MCG/ACT inhaler Inhale 1 puff into the lungs 2 (two) times daily. Patient not taking: Reported on 01/07/2016 10/11/14   Gwyneth Sprout, MD  hydrOXYzine (ATARAX/VISTARIL) 50 MG  tablet Take 2 tablets (100 mg total) by mouth at bedtime. Patient not taking: Reported on 01/07/2016 10/11/14   Gwyneth Sprout, MD  paliperidone (INVEGA) 6 MG 24 hr tablet Take 1 tablet (6 mg total) by mouth daily. Patient not taking: Reported on 01/07/2016 10/11/14   Gwyneth Sprout, MD     Allergies:  Allergies  Allergen Reactions  . Bee Venom Anaphylaxis  . Latex Rash    Social History:   reports that she quit smoking about 2 years ago. She does not have any smokeless tobacco history on file. She reports that she drinks alcohol. She reports that she uses illicit drugs (Marijuana and Cocaine).  Family History: History reviewed. No pertinent family history.   Physical Exam: Filed Vitals:   01/07/16 1800 01/07/16 1830 01/07/16 1900 01/07/16 2003  BP:    112/72  Pulse:  94  94  Temp:      TempSrc:      Resp: SpO2:  99%  100%   Blood pressure 112/72, pulse 94, temperature 99.2 F (37.3 C), temperature source Oral, resp. rate 16, SpO2 100 %.  GEN:  Pleasant  patient lying in the stretcher in no acute distress; cooperative with exam. PSYCH:  alert and but confused.  does not appear anxious or depressed; affect is appropriate. HEENT: Mucous membranes pink and anicteric; PERRLA; EOM intact; no cervical lymphadenopathy nor thyromegaly or carotid bruit; no JVD; There were no stridor. Neck is very supple. Breasts:: Not examined CHEST WALL: No tenderness CHEST: Normal respiration, clear to auscultation bilaterally.  HEART: Regular rate and rhythm.  There are no murmur, rub, or gallops.   BACK: No kyphosis or scoliosis; no CVA tenderness ABDOMEN: soft and non-tender; no masses, no organomegaly, normal abdominal bowel sounds; no pannus; no intertriginous candida. There is no rebound and no distention. Rectal Exam: Not done EXTREMITIES: No bone or joint deformity; age-appropriate arthropathy of the hands and knees; no edema; no ulcerations.  There is no calf  tenderness. Genitalia: not examined PULSES: 2+ and symmetric SKIN: Normal hydration no rash or ulceration CNS: Cranial nerves 2-12 grossly intact no focal lateralizing neurologic deficit.  Speech is fluent; uvula elevated with phonation, facial symmetry and tongue midline. DTR are normal bilaterally, cerebella exam is intact, barbinski is negative and strengths are equaled bilaterally.  No sensory loss.  Speech is tangential, and behavior is childlike.    Labs on Admission:  Basic Metabolic Panel:  Recent Labs Lab 01/05/16 2300 01/07/16 1550  NA 141 141  K 3.2* 3.8  CL 110 106  CO2 23 26  GLUCOSE 108* 89  BUN 11 12  CREATININE 0.78 0.67  CALCIUM 9.2 9.5   Liver Function Tests:  Recent Labs Lab 01/05/16 2300  AST 17  ALT 23  ALKPHOS 72  BILITOT 0.3  PROT 6.6  ALBUMIN 3.7   CBC:  Recent Labs Lab 01/05/16 2300 01/07/16 1550  WBC 10.0 9.0  NEUTROABS 4.7 5.4  HGB 13.1 14.4  HCT 39.1 43.7  MCV 89.1 90.3  PLT 259 277    Radiological Exams on Admission: Ct Head Wo Contrast  01/07/2016  CLINICAL DATA:  Three seizures earlier today EXAM: CT HEAD WITHOUT CONTRAST TECHNIQUE: Contiguous axial images were obtained from the base of the skull  through the vertex without intravenous contrast. COMPARISON:  August 24, 2014 FINDINGS: The ventricles are normal in size and configuration. There is no intracranial mass, hemorrhage, extra-axial fluid collection, or midline shift. Gray-white compartments appear normal. No acute infarct evident. Bony calvarium appears intact. The mastoid air cells are clear. There is a small retention cyst in each maxillary antrum. No intraorbital lesions are apparent. IMPRESSION: Mild maxillary sinus disease bilaterally. No intracranial mass, hemorrhage, or extra-axial fluid collection. Gray-white compartments appear normal. No evidence of acute infarct. Electronically Signed   By: Bretta Bang III M.D.   On: 01/07/2016 18:13   Assessment/Plan Present  on Admission:  . PTSD (post-traumatic stress disorder) . Conduct disorder, adolescent-onset type . ADHD (attention deficit hyperactivity disorder), combined type  PLAN:  Break through seizure:  Her activity today is suspicious for pseudoseizure, however, she has had long standing history of known seizure disorder, so will resume her ACD.  Will give Keppra a trial, and if she doesn't tolerate it , or if it is ineffective, we will need to  resume her previous seizure medication.   I placed a call to [facility] pharmacy, where she said she was filling her ACD, but there is no record of them, except the the Keppra.  Will monitor her, and await telepsych consultation.  Will continue with neuro consultation as well.  She is otherwise stable, full code, and will be admitted to [facility] service under IVC.   Thank you so much and Good Day.  Other plans as per orders.  Code Status: FULL Unk Lightning, MD. FACP [facility] Pager [phone] 7pm to 7am.  01/07/2016, 8:06 PM
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
## 2016-01-08 DIAGNOSIS — G40909 Epilepsy, unspecified, not intractable, without status epilepticus: Secondary | ICD-10-CM | POA: Diagnosis not present

## 2016-01-08 MED ORDER — NICOTINE 14 MG/24HR TD PT24
14.0000 mg | MEDICATED_PATCH | Freq: Every day | TRANSDERMAL | Status: DC
  Administered 2016-01-08 – 2016-01-09 (×2): 14 mg via TRANSDERMAL
  Filled 2016-01-08 (×3): qty 1

## 2016-01-08 NOTE — Progress Notes (Addendum)
PROGRESS NOTE  Erin Krueger ZOX:[id number] DOB: 12/31/1995 DOA: 01/07/2016 PCP: No PCP Per Patient  Summary: 20 y.o. female with hx of seizure since childhood, ADHD, PTSD, ODD, asthma, anxiety presented with complaints of seizures. Patient was recently admitted 01/05/16 for multiple witness seizure, for which she was provided IV Keppra and discharged on oral Keppra. Workup in the ED included: negative CT Head, unremarkable CBC, LFTs, and CMP. She has been admitted for further evaluation and inpatient neurology evaluation. She has been placed on IVC.  Assessment/Plan: 1. Recurrent seizures. Long-standing seizure disorder, off medications for months per patient. Currently controlled on Keppra with normal mental status and no recurrent seizures. Belligerent, abusive behavior in the emergency department precipitated IVC when the patient attempted to leave and was not felt to have capacity. Currently she is calm and appears comfortable and agrees to treatment plan. 2. PMH PTSD, ADHD, ODD, anxiety, eating disorder   Currently doing well. No recurrent seizures. Continue Keppra. Discussed with mental health who has recommended continuing IVC and recommends overnight hospitalization with reevaluation in the morning.  Code Status: Full DVT prophylaxis: Lovenox Family Communication: Boyfriend at bedisde Disposition Plan: Discharge home today   Brendia Sacks, MD  [facility]  Pager [phone] If 7PM-7AM, please contact night-coverage at [email or web address], password [facility] 01/08/2016, 12:12 PM    Consultants:  none  Procedures:  none  Antibiotics:  none  HPI/Subjective: Patient feels a lot better than yesterday. Slightly tired. No pain but numbness in left leg but has since subsided. No Sz since admission. Has been on Sz meds for several years, and was recently taken off approximately 10 months ago, but Sz started back up. Had several Sz yesterday, but does not remember them. Denies any  intent to harm or kill herself.   Objective: Filed Vitals:   01/07/16 2003 01/08/16 0554 01/08/16 0600 01/08/16 0858  BP: 112/72 95/65    Pulse: 94 78    Temp:  98.2 F (36.8 C)    TempSrc:  Oral    Resp: 16 16    Height:    (1.676 m)   SpO2: 100% 99%  96%    Intake/Output Summary (Last 24 hours) at 01/08/16 1212 Last data filed at 01/08/16 0858  Gross per 24 hour  Intake      0 ml  Output      0 ml  Net      0 ml     There were no vitals filed for this visit.  Exam:  Afebrile, VSS, not hypoxic  General:  Appears calm and comfortable Eyes: PERRL, normal lids, irises & conjunctiva Cardiovascular: RRR, no m/r/g. No LE edema. Telemetry: SR, no arrhythmias  Respiratory: CTA bilaterally, no w/r/r. Normal respiratory effort. Abdomen: soft, ntnd Musculoskeletal: grossly normal tone BUE/BLE Psychiatric: grossly normal mood and affect, speech fluent and appropriate. Oriented to name, place, month, and year. Neurologic: grossly non-focal.  RN present during examination and review  New data reviewed:  No new data  Pertinent data since admission:  UDS negative  CT Head negative  Pending data:  None  Scheduled Meds: . budesonide (PULMICORT) nebulizer solution  0.25 mg Nebulization BID  . enoxaparin (LOVENOX) injection  40 mg Subcutaneous Q24H  . levETIRAcetam  500 mg Oral BID  . sodium chloride flush  3 mL Intravenous Q12H   Continuous Infusions:   Principal Problem:   Seizure disorder (HCC) Active Problems:   PTSD (post-traumatic stress disorder)   ADHD (attention deficit hyperactivity disorder), combined type  Conduct disorder, adolescent-onset type   Seizure (HCC)   Time spent 25 minutes    By signing my name below, I, Adron Bene, attest that this documentation has been prepared under the direction and in the presence of Erin Jarnigan P. Irene Limbo, MD. Electronically Signed: Adron Bene, Scribe. 01/08/2016  12:10 pm   I personally  performed the services described in this documentation. All medical record entries made by the scribe were at my direction. I have reviewed the chart and agree that the record reflects my personal performance and is accurate and complete. Brendia Sacks, MD
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
## 2016-01-08 NOTE — Progress Notes (Signed)
Pt is requesting to see her sister, [name]. I called and no pick. Pt would like to try again later. [phone]. Will continue to monitor.

## 2016-01-09 DIAGNOSIS — G40909 Epilepsy, unspecified, not intractable, without status epilepticus: Secondary | ICD-10-CM | POA: Diagnosis not present

## 2016-01-09 DIAGNOSIS — F902 Attention-deficit hyperactivity disorder, combined type: Secondary | ICD-10-CM | POA: Diagnosis not present

## 2016-01-09 DIAGNOSIS — F4310 Post-traumatic stress disorder, unspecified: Secondary | ICD-10-CM | POA: Diagnosis not present

## 2016-01-09 MED ORDER — ACETAMINOPHEN 500 MG PO TABS: 500.0000 mg | ORAL_TABLET | Freq: Four times a day (QID) | ORAL | Status: DC | PRN

## 2016-01-09 MED ORDER — ALBUTEROL SULFATE HFA 108 (90 BASE) MCG/ACT IN AERS: 2.0000 | INHALATION_SPRAY | Freq: Four times a day (QID) | RESPIRATORY_TRACT | Status: DC | PRN

## 2016-01-09 NOTE — Care Management Note (Signed)
Case Management Note  Patient Details  Name: Nastashia Gallo MRN: [id number] Date of Birth: 1996-07-21  Subjective/Objective:                  Pt admitted with seizures. Pt is from home, and is ind with ADL's. Pt has medicaid for drug coverage. Pt will f/u with [facility] and neurologist.   Action/Plan: No CM needs.   Expected Discharge Date:    01/09/2016              Expected Discharge Plan:  Home/Self Care  In-House Referral:  NA  Discharge planning Services  CM Consult  Post Acute Care Choice:  NA Choice offered to:  NA  DME Arranged:    DME Agency:     HH Arranged:    HH Agency:     Status of Service:  Completed, signed off  Medicare Important Message Given:    Date Medicare IM Given:    Medicare IM give by:    Date Additional Medicare IM Given:    Additional Medicare Important Message give by:     If discussed at Long Length of Stay Meetings, dates discussed:    Additional Comments:  Malcolm Metro, RN 01/09/2016, 11:22 AM

## 2016-01-09 NOTE — Progress Notes (Signed)
Spoke with pt's sister [name] [phone]. She states pt resides with her in [place], has Medicaid coverage, and has not seen a mental health provider x7 months (has not taken medications in that time per sister). States she has been to [facility] in the past and cannot recall pt's other providers. States pt was taking Invega oral and a "sleep medication," cannot recall name. States Hinda Glatter was effective in managing pt's symptoms and that she is open to trying again.   In pt d/c instructions, provided list of options for outpatient mental health providers in [place] area. Sister states she will assist pt in setting up and attending appointment once they choose provider.   Provided sister with contact information in case additional information is needed.  Ilean Skill, MSW, LCSW Clinical Social Work, Disposition  01/09/2016 [phone]
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
## 2016-01-09 NOTE — Progress Notes (Signed)
[facility] discharged Home per MD order.  Discharge instructions reviewed and discussed with the patient, all questions and concerns answered. Copy of instructions and scripts given to patient.    Medication List    STOP taking these medications        hydrOXYzine 50 MG tablet  Commonly known as:  ATARAX/VISTARIL     paliperidone 6 MG 24 hr tablet  Commonly known as:  INVEGA      TAKE these medications        acetaminophen 500 MG tablet  Commonly known as:  TYLENOL  Take 1 tablet (500 mg total) by mouth every 6 (six) hours as needed for mild pain.     albuterol 108 (90 Base) MCG/ACT inhaler  Commonly known as:  PROVENTIL HFA;VENTOLIN HFA  Inhale 2 puffs into the lungs every 6 (six) hours as needed for wheezing. Patient may resume home supply.     beclomethasone 80 MCG/ACT inhaler  Commonly known as:  QVAR  Inhale 1 puff into the lungs 2 (two) times daily.     EPINEPHrine 0.3 mg/0.3 mL Soaj injection  Commonly known as:  EPI-PEN  Inject 0.3 mg into the muscle as needed (Bee Stings).     levETIRAcetam 500 MG tablet  Commonly known as:  KEPPRA  Take 1 tablet (500 mg total) by mouth 2 (two) times daily.        Patients skin is clean, dry and intact, no evidence of skin break down. IV site discontinued and catheter remains intact. Site without signs and symptoms of complications. Dressing and pressure applied.  Patient escorted out in a wheelchair,  no distress noted upon discharge.  Jerilee Hoh

## 2016-01-10 ENCOUNTER — Encounter (HOSPITAL_COMMUNITY): Payer: Self-pay

## 2016-01-10 ENCOUNTER — Emergency Department (HOSPITAL_COMMUNITY)
Admission: EM | Admit: 2016-01-10 | Discharge: 2016-01-10 | Discharge status: Against Medical Advice | Payer: Medicaid Other | Attending: Emergency Medicine | Admitting: Emergency Medicine

## 2016-01-10 DIAGNOSIS — Z79899 Other long term (current) drug therapy: Secondary | ICD-10-CM | POA: Diagnosis not present

## 2016-01-10 DIAGNOSIS — Z87891 Personal history of nicotine dependence: Secondary | ICD-10-CM | POA: Insufficient documentation

## 2016-01-10 DIAGNOSIS — Z91040 Latex allergy status: Secondary | ICD-10-CM | POA: Insufficient documentation

## 2016-01-10 DIAGNOSIS — J45909 Unspecified asthma, uncomplicated: Secondary | ICD-10-CM | POA: Insufficient documentation

## 2016-01-10 DIAGNOSIS — G40909 Epilepsy, unspecified, not intractable, without status epilepticus: Secondary | ICD-10-CM | POA: Diagnosis not present

## 2016-01-10 DIAGNOSIS — Z8659 Personal history of other mental and behavioral disorders: Secondary | ICD-10-CM | POA: Diagnosis not present

## 2016-01-10 DIAGNOSIS — R569 Unspecified convulsions: Secondary | ICD-10-CM | POA: Diagnosis present

## 2016-01-10 LAB — BASIC METABOLIC PANEL
Anion gap: 7 (ref 5–15)
BUN: 12 mg/dL (ref 6–20)
CO2: 27 mmol/L (ref 22–32)
Calcium: 9.2 mg/dL (ref 8.9–10.3)
Chloride: 106 mmol/L (ref 101–111)
Creatinine, Ser: 0.68 mg/dL (ref 0.44–1.00)
GFR calc Af Amer: >60 mL/min (ref >60)
GFR calc non Af Amer: >60 mL/min (ref >60)
Glucose, Bld: 86 mg/dL (ref 65–99)
Potassium: 3.7 mmol/L (ref 3.5–5.1)
Sodium: 140 mmol/L (ref 135–145)

## 2016-01-10 LAB — CBC WITH DIFFERENTIAL/PLATELET
Basophils Absolute: 0 10*3/uL (ref 0.0–0.1)
Basophils Relative: 0 %
Eosinophils Absolute: 0.3 10*3/uL (ref 0.0–0.7)
Eosinophils Relative: 3 %
HCT: 41.6 % (ref 36.0–46.0)
Hemoglobin: 14.1 g/dL (ref 12.0–15.0)
Lymphocytes Relative: 30 %
Lymphs Abs: 3.6 10*3/uL (ref 0.7–4.0)
MCH: 30.5 pg (ref 26.0–34.0)
MCHC: 33.9 g/dL (ref 30.0–36.0)
MCV: 90 fL (ref 78.0–100.0)
Monocytes Absolute: 0.8 10*3/uL (ref 0.1–1.0)
Monocytes Relative: 6 %
Neutro Abs: 7.2 10*3/uL (ref 1.7–7.7)
Neutrophils Relative %: 61 %
Platelets: 255 10*3/uL (ref 150–400)
RBC: 4.62 MIL/uL (ref 3.87–5.11)
RDW: 12.4 % (ref 11.5–15.5)
WBC: 11.9 10*3/uL — ABNORMAL HIGH (ref 4.0–10.5)

## 2016-01-10 LAB — ETHANOL: Alcohol, Ethyl (B): <5 mg/dL (ref <5)

## 2016-01-10 LAB — RAPID URINE DRUG SCREEN, HOSP PERFORMED
Amphetamines: NOT DETECTED
Barbiturates: NOT DETECTED
Benzodiazepines: NOT DETECTED
Cocaine: NOT DETECTED
Opiates: NOT DETECTED
Tetrahydrocannabinol: NOT DETECTED

## 2016-01-10 MED ORDER — LORAZEPAM 2 MG/ML IJ SOLN
1.0000 mg | Freq: Once | INTRAMUSCULAR | Status: AC
  Administered 2016-01-10: 1 mg via INTRAVENOUS
  Filled 2016-01-10: qty 1

## 2016-01-10 MED ORDER — SODIUM CHLORIDE 0.9 % IV BOLUS (SEPSIS)
1000.0000 mL | Freq: Once | INTRAVENOUS | Status: AC
  Administered 2016-01-10: 1000 mL via INTRAVENOUS

## 2016-01-10 NOTE — ED Provider Notes (Addendum)
CSN: [id number]     Arrival date & time 01/10/16  1844 History   First MD Initiated Contact with Patient 01/10/16 1916     Chief Complaint  Patient presents with  . Seizures     (Consider location/radiation/quality/duration/timing/severity/associated sxs/prior Treatment) HPI..... Level V caveat for seizure. Patient with a suspected seizure disorder presents with a seizure earlier tonight. She was recently admitted to the hospital within the past few days and neurology was consulted. Patient was started on Keppra 500 mg twice a day. She has multiple mental health conditions which are marginally controlled. No prodromal illnesses. Sister reports that she has been taking her medicine.  Past Medical History  Diagnosis Date  . ADHD (attention deficit hyperactivity disorder)   . PTSD (post-traumatic stress disorder)   . ODD (oppositional defiant disorder)   . Asthma   . Anxiety   . Allergy   . Eating disorder   . [id number](478.2)    Past Surgical History  Procedure Laterality Date  . Tonsillectomy     History reviewed. No pertinent family history. Social History  Substance Use Topics  . Smoking status: Former Smoker    Quit date: 07/21/2013  . Smokeless tobacco: None  . Alcohol Use: Yes     Comment: 3-4 glassed vodka/beer   OB History    No data available     Review of Systems  All other systems reviewed and are negative.     Allergies  Bee venom and Latex  Home Medications   Prior to Admission medications   Medication Sig Start Date End Date Taking? Authorizing Provider  acetaminophen (TYLENOL) 500 MG tablet Take 1 tablet (500 mg total) by mouth every 6 (six) hours as needed for mild pain. 01/09/16  Yes Standley Brooking, MD  albuterol (PROVENTIL HFA;VENTOLIN HFA) 108 (90 Base) MCG/ACT inhaler Inhale 2 puffs into the lungs every 6 (six) hours as needed for wheezing. Patient may resume home supply. 01/09/16  Yes Standley Brooking, MD  EPINEPHrine 0.3 mg/0.3 mL IJ SOAJ  injection Inject 0.3 mg into the muscle as needed (Bee Stings).   Yes Historical Provider, MD  levETIRAcetam (KEPPRA) 500 MG tablet Take 1 tablet (500 mg total) by mouth 2 (two) times daily. 01/06/16  Yes Kristen N Ward, DO  beclomethasone (QVAR) 80 MCG/ACT inhaler Inhale 1 puff into the lungs 2 (two) times daily. Patient not taking: Reported on 01/07/2016 10/11/14   Gwyneth Sprout, MD   BP 108/84 mmHg  Pulse 75  Temp(Src) 98.5 F (36.9 C) (Oral)  Resp 21  Ht  (1.702 m)  Wt 205 lb (92.987 kg)  BMI 32.10 kg/m2  SpO2 100% Physical Exam  Constitutional: She is oriented to person, place, and time.  Alert, does not appear postictal.  HENT:  Head: Normocephalic and atraumatic.  Eyes: Conjunctivae and EOM are normal. Pupils are equal, round, and reactive to light.  Neck: Normal range of motion. Neck supple.  Cardiovascular: Normal rate and regular rhythm.   Pulmonary/Chest: Effort normal and breath sounds normal.  Abdominal: Soft. Bowel sounds are normal.  Musculoskeletal: Normal range of motion.  Neurological: She is alert and oriented to person, place, and time.  Skin: Skin is warm and dry.  Psychiatric: She has a normal mood and affect. Her behavior is normal.  Nursing note and vitals reviewed.   ED Course  Procedures (including critical care time) Labs Review Labs Reviewed  CBC WITH DIFFERENTIAL/PLATELET - Abnormal; Notable for the following:    WBC 11.9 (*)  All other components within normal limits  BASIC METABOLIC PANEL  ETHANOL  URINE RAPID DRUG SCREEN, HOSP PERFORMED    Imaging Review No results found. I have personally reviewed and evaluated these images and lab results as part of my medical decision-making.   EKG Interpretation   Date/Time:  Friday January 10 2016 18:54:46 EST Ventricular Rate:  72 PR Interval:  158 QRS Duration: 98 QT Interval:  362 QTC Calculation: 396 R Axis:   89 Text Interpretation:  Sinus rhythm Borderline Q waves in inferior  leads  Confirmed by Na Waldrip  MD, Saprina Chuong ([id number]) on 01/10/2016 7:52:40 PM      MDM   Final diagnoses:  Seizure disorder (HCC)    Difficult clinical scenario: Patient apparently has a seizure disorder which is complicated with multiple mental health conditions. She left AMA with her sister. At discharge she did not appear in any acute distress. She was not committable.    Donnetta Hutching, MD 01/10/16 2350  Donnetta Hutching, MD 01/21/16 [phone]

## 2016-01-10 NOTE — ED Notes (Signed)
Pt left AMA at this time, removed her own IV, refused to sign out AMA

## 2016-01-10 NOTE — ED Notes (Signed)
Per sister patient was at home and suffered three seizures within a one hour span, the first lasting 3 mins, the second lasting 2 mins and the third lasting 4 minutes. Reports patient did not fall or hit anything during seizures. Did not lose control of bladder or bowel. Patient alert when EMS arrived. Presently patient is alert and will follow commands but is disoriented times 4. At times patients speak is incomprehensive, and not appropriate to questions.

## 2016-01-29 ENCOUNTER — Inpatient Hospital Stay (HOSPITAL_COMMUNITY)
Admission: EM | Admit: 2016-01-29 | Discharge: 2016-01-31 | DRG: 759 | Disposition: A | Discharge status: Home / Self Care | Payer: Medicaid Other | Attending: Obstetrics and Gynecology | Admitting: Obstetrics and Gynecology

## 2016-01-29 ENCOUNTER — Emergency Department (HOSPITAL_COMMUNITY): Payer: Medicaid Other

## 2016-01-29 ENCOUNTER — Encounter (HOSPITAL_COMMUNITY): Payer: Self-pay | Admitting: Emergency Medicine

## 2016-01-29 DIAGNOSIS — N946 Dysmenorrhea, unspecified: Secondary | ICD-10-CM | POA: Diagnosis present

## 2016-01-29 DIAGNOSIS — F909 Attention-deficit hyperactivity disorder, unspecified type: Secondary | ICD-10-CM | POA: Diagnosis present

## 2016-01-29 DIAGNOSIS — G40909 Epilepsy, unspecified, not intractable, without status epilepticus: Secondary | ICD-10-CM | POA: Diagnosis present

## 2016-01-29 DIAGNOSIS — R1013 Epigastric pain: Secondary | ICD-10-CM

## 2016-01-29 DIAGNOSIS — R1030 Lower abdominal pain, unspecified: Secondary | ICD-10-CM | POA: Diagnosis not present

## 2016-01-29 DIAGNOSIS — F913 Oppositional defiant disorder: Secondary | ICD-10-CM | POA: Diagnosis present

## 2016-01-29 DIAGNOSIS — R102 Pelvic and perineal pain: Secondary | ICD-10-CM | POA: Diagnosis not present

## 2016-01-29 DIAGNOSIS — F4310 Post-traumatic stress disorder, unspecified: Secondary | ICD-10-CM | POA: Diagnosis present

## 2016-01-29 DIAGNOSIS — J45909 Unspecified asthma, uncomplicated: Secondary | ICD-10-CM | POA: Diagnosis present

## 2016-01-29 DIAGNOSIS — R52 Pain, unspecified: Secondary | ICD-10-CM

## 2016-01-29 DIAGNOSIS — N730 Acute parametritis and pelvic cellulitis: Secondary | ICD-10-CM | POA: Diagnosis not present

## 2016-01-29 DIAGNOSIS — N939 Abnormal uterine and vaginal bleeding, unspecified: Secondary | ICD-10-CM | POA: Diagnosis present

## 2016-01-29 DIAGNOSIS — R110 Nausea: Secondary | ICD-10-CM | POA: Diagnosis not present

## 2016-01-29 DIAGNOSIS — F17210 Nicotine dependence, cigarettes, uncomplicated: Secondary | ICD-10-CM | POA: Diagnosis present

## 2016-01-29 HISTORY — DX: Acute parametritis and pelvic cellulitis: N73.0

## 2016-01-29 LAB — CBC
HCT: 42.7 % (ref 36.0–46.0)
Hemoglobin: 14 g/dL (ref 12.0–15.0)
MCH: 29.7 pg (ref 26.0–34.0)
MCHC: 32.8 g/dL (ref 30.0–36.0)
MCV: 90.5 fL (ref 78.0–100.0)
Platelets: 247 10*3/uL (ref 150–400)
RBC: 4.72 MIL/uL (ref 3.87–5.11)
RDW: 12.8 % (ref 11.5–15.5)
WBC: 14.1 10*3/uL — ABNORMAL HIGH (ref 4.0–10.5)

## 2016-01-29 LAB — URINALYSIS, ROUTINE W REFLEX MICROSCOPIC
Glucose, UA: NEGATIVE mg/dL
Ketones, ur: 40 mg/dL — AB
Nitrite: NEGATIVE
Protein, ur: 30 mg/dL — AB
Specific Gravity, Urine: >1.03 — ABNORMAL HIGH (ref 1.005–1.030)
pH: 5.5 (ref 5.0–8.0)

## 2016-01-29 LAB — COMPREHENSIVE METABOLIC PANEL
ALT: 24 U/L (ref 14–54)
AST: 13 U/L — ABNORMAL LOW (ref 15–41)
Albumin: 4 g/dL (ref 3.5–5.0)
Alkaline Phosphatase: 75 U/L (ref 38–126)
Anion gap: 7 (ref 5–15)
BUN: 11 mg/dL (ref 6–20)
CO2: 24 mmol/L (ref 22–32)
Calcium: 9.2 mg/dL (ref 8.9–10.3)
Chloride: 110 mmol/L (ref 101–111)
Creatinine, Ser: 0.67 mg/dL (ref 0.44–1.00)
GFR calc Af Amer: >60 mL/min (ref >60)
GFR calc non Af Amer: >60 mL/min (ref >60)
Glucose, Bld: 94 mg/dL (ref 65–99)
Potassium: 3.9 mmol/L (ref 3.5–5.1)
Sodium: 141 mmol/L (ref 135–145)
Total Bilirubin: 0.7 mg/dL (ref 0.3–1.2)
Total Protein: 7 g/dL (ref 6.5–8.1)

## 2016-01-29 LAB — URINE MICROSCOPIC-ADD ON

## 2016-01-29 LAB — I-STAT BETA HCG BLOOD, ED (MC, WL, AP ONLY): I-stat hCG, quantitative: <5 m[IU]/mL (ref <5)

## 2016-01-29 LAB — WET PREP, GENITAL
Clue Cells Wet Prep HPF POC: NONE SEEN
Sperm: NONE SEEN
Trich, Wet Prep: NONE SEEN
Yeast Wet Prep HPF POC: NONE SEEN

## 2016-01-29 LAB — LIPASE, BLOOD: Lipase: 30 U/L (ref 11–51)

## 2016-01-29 LAB — PREGNANCY, URINE: Preg Test, Ur: NEGATIVE

## 2016-01-29 IMAGING — CT CT RENAL STONE PROTOCOL
2 of 5 series · 15 of 46 positions shown, 17 images · non-contrast
Comparison: [DATE]

CLINICAL DATA: Right flank pain and vomiting starting yesterday at
2 p.m.

EXAM:
CT ABDOMEN AND PELVIS WITHOUT CONTRAST
TECHNIQUE: Multidetector CT imaging of the abdomen and pelvis was performed
following the standard protocol without IV contrast.

[Series 2: standard/full over (age)lbs 5.0 · axial · 0.75mm/px · z∈[-594,-154]mm · 12 of 102 slices shown, 14 images]
[im 9/102  soft-tissue]
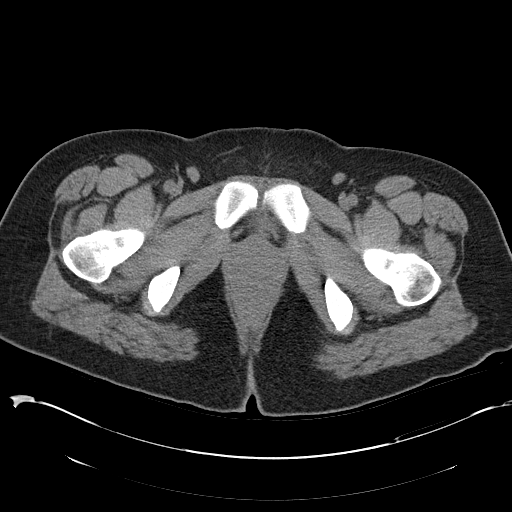
[im 9/102  bone]
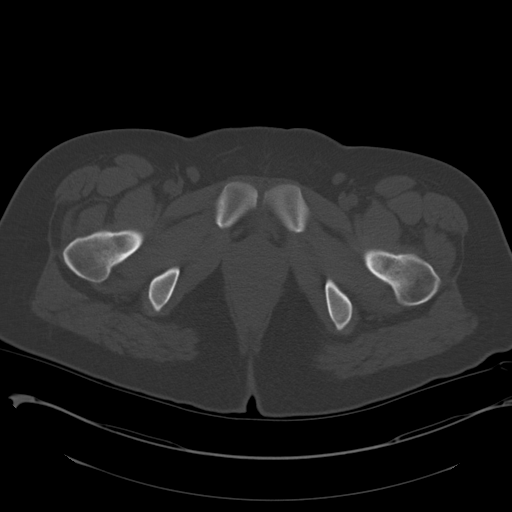
[im 17/102  soft-tissue]
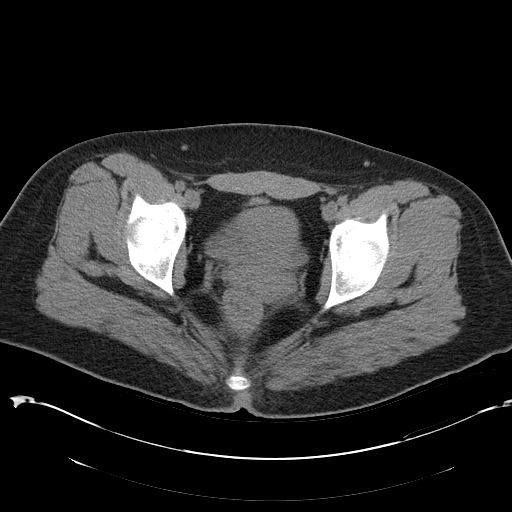
[im 25/102  soft-tissue]
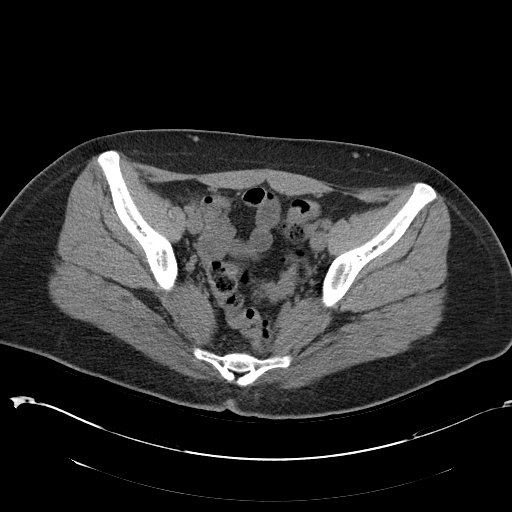
[im 33/102  soft-tissue]
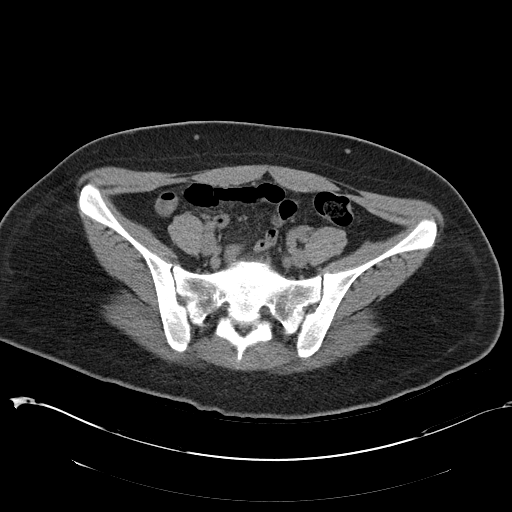
[im 41/102  soft-tissue]
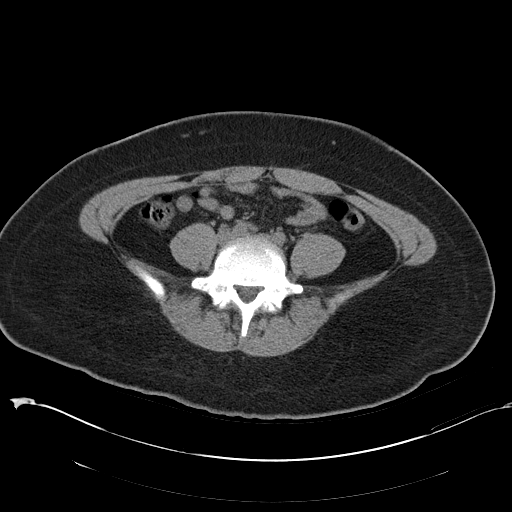
[im 49/102  soft-tissue]
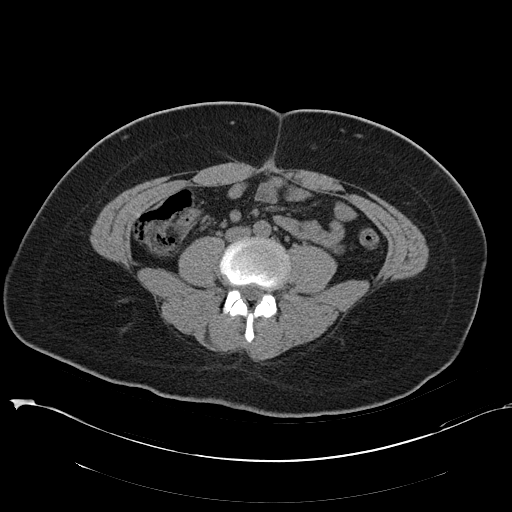
[im 57/102  soft-tissue]
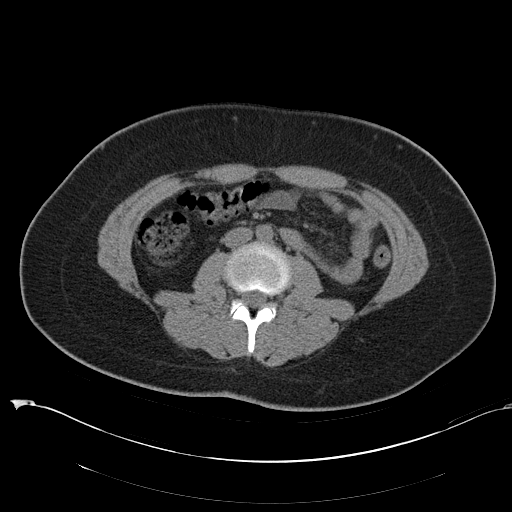
[im 65/102  soft-tissue]
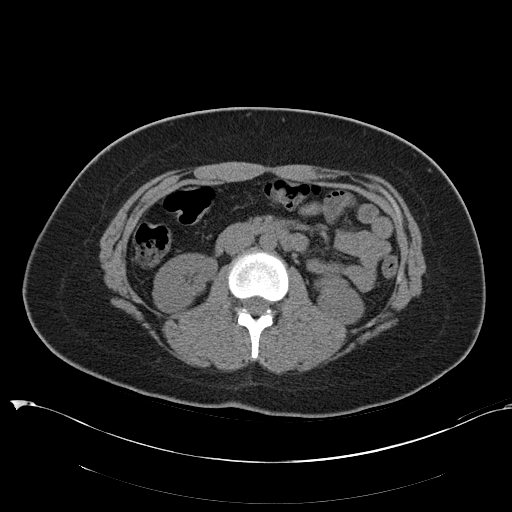
[im 73/102  soft-tissue]
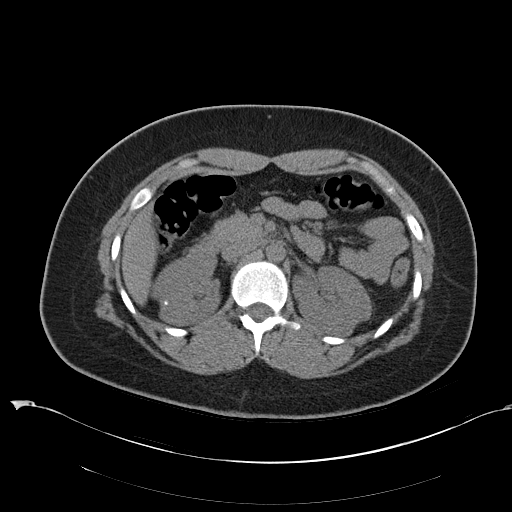
[im 73/102  bone]
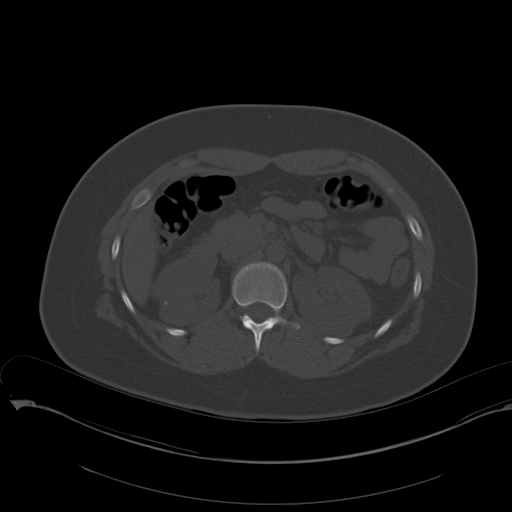
[im 81/102  soft-tissue]
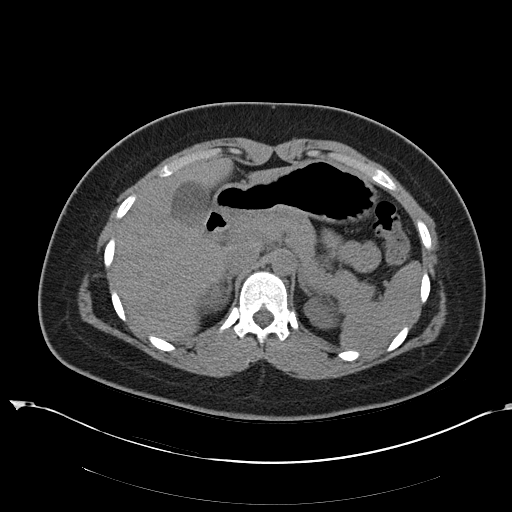
[im 89/102  soft-tissue]
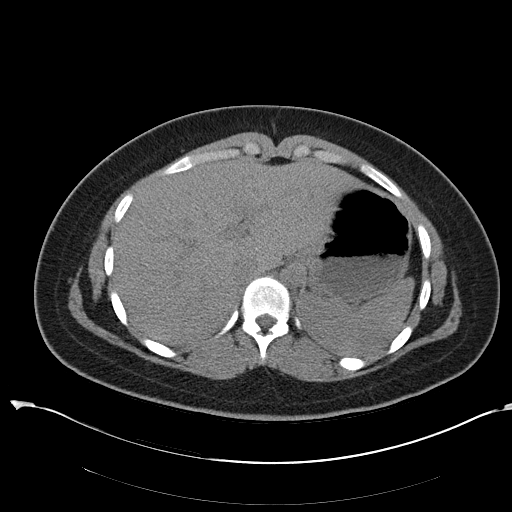
[im 97/102  soft-tissue]
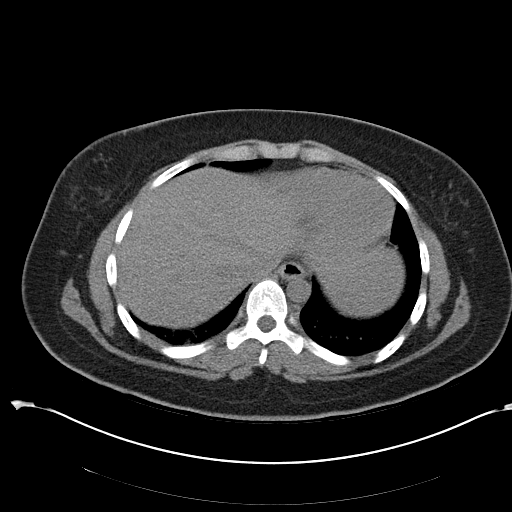

[Series 3: mpr coronal · coronal · 0.74mm/px · 3 of 84 slices shown]
[im 28/84  soft-tissue]
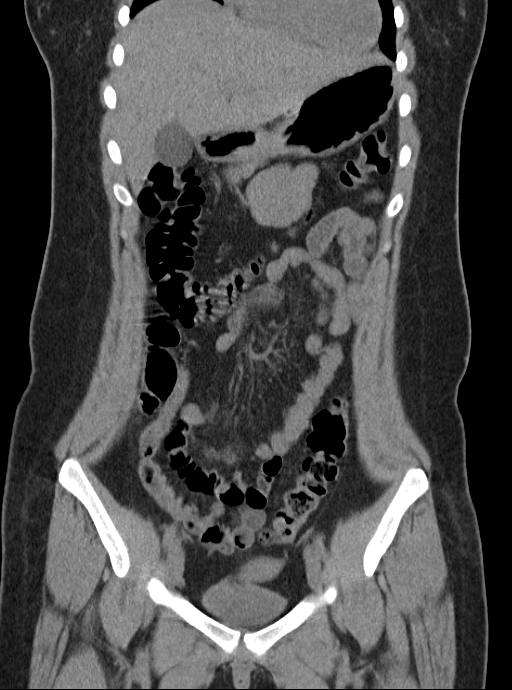
[im 37/84  soft-tissue]
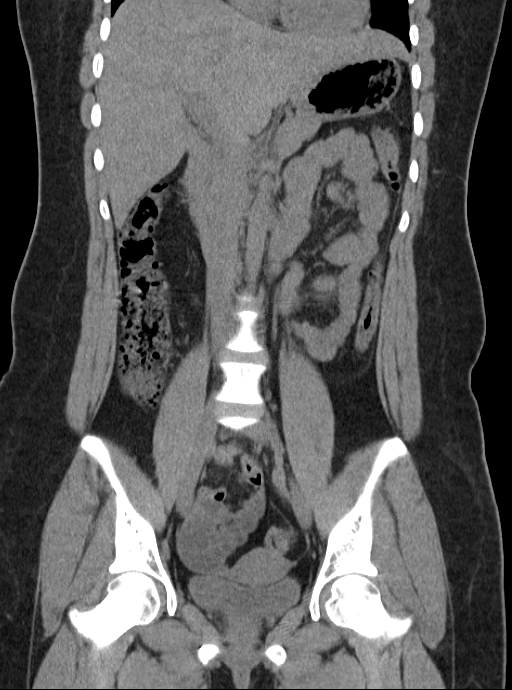
[im 47/84  soft-tissue]
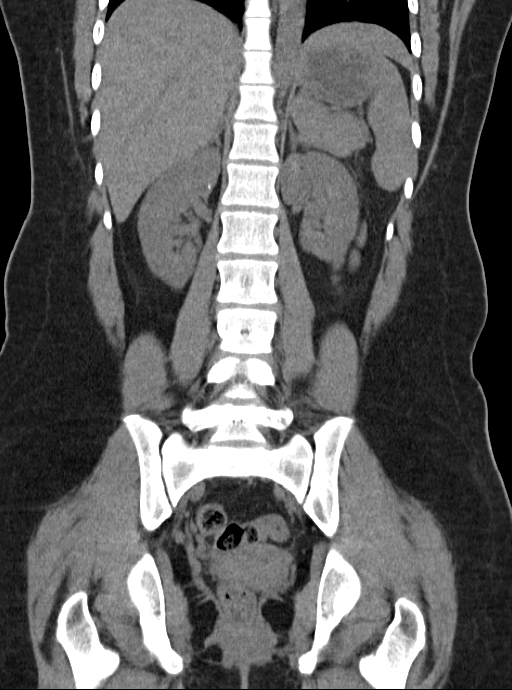

[15 of 46 positions shown; findings below may reference images not displayed]

FINDINGS: Lower chest:  Visualized lung bases are unremarkable.

Hepatobiliary: Unenhanced liver shows no biliary ductal dilatation.
No calcified gallstones are noted within gallbladder. No CBD
dilatation.

Pancreas: Unenhanced pancreas is unremarkable.

Spleen: Unenhanced spleen is unremarkable

Adrenals/Urinary Tract: No adrenal gland mass is noted.

Again noted bilateral mid pole renal cortical scarring. No
hydronephrosis or hydroureter. A cortical calcification in midpole
of the right kidney measures 4 mm stable from prior exam. Stable
punctate cortical calcification in midpole of the right kidney
medially measures about 1 mm.

No hydronephrosis or hydroureter. No calcified ureteral calculi are
noted bilaterally.

Bilateral distal ureter is unremarkable. No calcified calculi are
noted within urinary bladder.

Stomach/Bowel: No thickened or dilated small bowel loops are noted.
No small bowel obstruction. There is no evidence of gastric outlet
obstruction. Some colonic gas and stool noted in right colon. The
terminal ileum is unremarkable. Normal appendix is noted in axial
image 66. No pericecal inflammation.

There is no distal colonic obstruction.

Vascular/Lymphatic: No aortic aneurysm. No mesenteric of
retroperitoneal adenopathy. Few small nonspecific lymph nodes are
noted in right lower quadrant mesentery. Bilateral small nonspecific
inguinal lymph nodes are noted.

Reproductive: The uterus is anteflexed. There is a right ovarian
simple cyst measures 3.8 by 3.5 cm. No pelvic free fluid is noted.

Other: No ascites or free air.

Musculoskeletal: Sagittal images of the spine shows no destructive
bony lesions. Alignment, disc spaces and vertebral body heights are
preserved.
IMPRESSION: 1. Again noted bilateral renal cortical scarring. Stable cortical
calcifications in right kidney. No hydronephrosis or hydroureter.
2. No calcified ureteral calculi.
3. No small bowel obstruction. No pericecal inflammation. Normal
appendix.
4. There is a right ovarian cyst measures 3.8 x 3.5 cm. Anteflexed
uterus. No pelvic free fluid.
5. No calcified calculi are noted within urinary bladder.

## 2016-01-29 MED ORDER — LEVETIRACETAM 500 MG PO TABS
500.0000 mg | ORAL_TABLET | Freq: Two times a day (BID) | ORAL | Status: DC
  Administered 2016-01-29 – 2016-01-31 (×4): 500 mg via ORAL
  Filled 2016-01-29 (×4): qty 1

## 2016-01-29 MED ORDER — ONDANSETRON HCL 4 MG/2ML IJ SOLN: 4.0000 mg | Freq: Once | INTRAMUSCULAR | Status: DC

## 2016-01-29 MED ORDER — HYDROMORPHONE HCL 1 MG/ML IJ SOLN
1.0000 mg | Freq: Once | INTRAMUSCULAR | Status: AC
  Administered 2016-01-29: 1 mg via INTRAVENOUS
  Filled 2016-01-29: qty 1

## 2016-01-29 MED ORDER — DOXYCYCLINE HYCLATE 100 MG IV SOLR
100.0000 mg | Freq: Two times a day (BID) | INTRAVENOUS | Status: DC
  Administered 2016-01-30 (×2): 100 mg via INTRAVENOUS
  Filled 2016-01-29 (×4): qty 100

## 2016-01-29 MED ORDER — BUDESONIDE 0.25 MG/2ML IN SUSP
2.0000 mL | Freq: Two times a day (BID) | RESPIRATORY_TRACT | Status: DC
Start: 2016-01-29 — End: 2016-01-31
  Administered 2016-01-30 – 2016-01-31 (×3): 0.25 mg via RESPIRATORY_TRACT
  Filled 2016-01-29 (×9): qty 2

## 2016-01-29 MED ORDER — SODIUM CHLORIDE 0.9 % IV BOLUS (SEPSIS)
1000.0000 mL | Freq: Once | INTRAVENOUS | Status: AC
  Administered 2016-01-29: 1000 mL via INTRAVENOUS

## 2016-01-29 MED ORDER — LORAZEPAM 2 MG/ML IJ SOLN
0.5000 mg | Freq: Once | INTRAMUSCULAR | Status: AC
  Administered 2016-01-29: 0.5 mg via INTRAVENOUS
  Filled 2016-01-29: qty 1

## 2016-01-29 MED ORDER — PROMETHAZINE HCL 12.5 MG PO TABS: 25.0000 mg | ORAL_TABLET | Freq: Four times a day (QID) | ORAL | Status: DC | PRN

## 2016-01-29 MED ORDER — DEXTROSE 5 % IV SOLN
1.0000 g | Freq: Two times a day (BID) | INTRAVENOUS | Status: DC
  Administered 2016-01-30 – 2016-01-31 (×3): 1 g via INTRAVENOUS
  Filled 2016-01-29 (×4): qty 1

## 2016-01-29 MED ORDER — PROMETHAZINE HCL 25 MG/ML IJ SOLN
25.0000 mg | Freq: Four times a day (QID) | INTRAMUSCULAR | Status: DC | PRN
  Filled 2016-01-29: qty 1

## 2016-01-29 MED ORDER — ALBUTEROL SULFATE (2.5 MG/3ML) 0.083% IN NEBU
3.0000 mL | INHALATION_SOLUTION | Freq: Four times a day (QID) | RESPIRATORY_TRACT | Status: DC | PRN
  Administered 2016-01-30 – 2016-01-31 (×3): 3 mL via RESPIRATORY_TRACT
  Filled 2016-01-29 (×3): qty 3

## 2016-01-29 MED ORDER — DEXTROSE 5 % IV SOLN
1.0000 g | Freq: Once | INTRAVENOUS | Status: AC
  Administered 2016-01-29: 1 g via INTRAVENOUS
  Filled 2016-01-29: qty 10

## 2016-01-29 MED ORDER — IBUPROFEN 600 MG PO TABS: 600.0000 mg | ORAL_TABLET | Freq: Four times a day (QID) | ORAL | Status: DC | PRN

## 2016-01-29 MED ORDER — PRENATAL MULTIVITAMIN CH
1.0000 | ORAL_TABLET | Freq: Every day | ORAL | Status: DC
  Administered 2016-01-30 – 2016-01-31 (×2): 1 via ORAL
  Filled 2016-01-29 (×5): qty 1

## 2016-01-29 MED ORDER — MORPHINE SULFATE (PF) 4 MG/ML IV SOLN
6.0000 mg | Freq: Once | INTRAVENOUS | Status: AC
  Administered 2016-01-29: 4 mg via INTRAVENOUS
  Filled 2016-01-29: qty 2

## 2016-01-29 MED ORDER — DOCUSATE SODIUM 100 MG PO CAPS
100.0000 mg | ORAL_CAPSULE | Freq: Two times a day (BID) | ORAL | Status: DC
  Administered 2016-01-30 – 2016-01-31 (×3): 100 mg via ORAL
  Filled 2016-01-29 (×3): qty 1

## 2016-01-29 MED ORDER — ONDANSETRON HCL 4 MG/2ML IJ SOLN
4.0000 mg | Freq: Once | INTRAMUSCULAR | Status: AC
  Administered 2016-01-29: 4 mg via INTRAVENOUS
  Filled 2016-01-29: qty 2

## 2016-01-29 MED ORDER — HYDROMORPHONE HCL 1 MG/ML IJ SOLN
1.0000 mg | INTRAMUSCULAR | Status: DC | PRN
  Administered 2016-01-29 – 2016-01-30 (×4): 1 mg via INTRAVENOUS
  Filled 2016-01-29 (×4): qty 1

## 2016-01-29 MED ORDER — KETOROLAC TROMETHAMINE 30 MG/ML IJ SOLN
30.0000 mg | Freq: Once | INTRAMUSCULAR | Status: AC
  Administered 2016-01-29: 30 mg via INTRAVENOUS
  Filled 2016-01-29: qty 1

## 2016-01-29 MED ORDER — OXYCODONE-ACETAMINOPHEN 5-325 MG PO TABS
1.0000 | ORAL_TABLET | ORAL | Status: DC | PRN
  Administered 2016-01-30: 2 via ORAL
  Filled 2016-01-29: qty 2

## 2016-01-29 NOTE — ED Notes (Signed)
Pt states she has been having right flank pain with vomiting since yesterday at 2pm.  Reports vaginal bleeding and hematemesis as well.

## 2016-01-29 NOTE — ED Notes (Signed)
No answer in waiting area.

## 2016-01-29 NOTE — ED Notes (Signed)
MD at bedside. 

## 2016-01-29 NOTE — H&P (Signed)
Erin Krueger is an 20 y.o. female. Who is having her first period in 4 yrs use of Nexplanon, and she presents to [facility] complaining of such pelvic pain that PID is suspected. She is in a stable relationship at present, unknown length since STI testing, and has pelvic paini she rates a 8.5. Of 10. She has been nauseated but is thirsty, and CT abdomen shows a normal appendix. She began her first period in 4 yrs yesterday and is severely uncomfortable in the abdomen. She has no recorded hx of GC or CHL.  Pertinent Gynecological History: Menses: suppressed x 3 + yr with nexplanon, began her first menses yesterday. Bleeding:  Contraception: Nexplanon DES exposure: unknown Blood transfusions: none Sexually transmitted diseases: no past history Previous GYN Procedures: Vol interrupt of pregnancy x 1  Last mammogram:  Date:  Last pap:  Date:  OB History: G1 P0010 VIP, claims boyfriends mom insisted.   Menstrual History: Menarche age:  No LMP recorded.  no menses while on Nexplanon  x 3 yr  Past Medical History  Diagnosis Date  . ADHD (attention deficit hyperactivity disorder)   . PTSD (post-traumatic stress disorder)   . ODD (oppositional defiant disorder)   . Asthma   . Anxiety   . Allergy   . Eating disorder   . ZOXWRUEA(540.9)     Past Surgical History  Procedure Laterality Date  . Tonsillectomy      History reviewed. No pertinent family history.  Social History:  reports that she has been smoking Cigarettes.  She has been smoking about 1.00 pack per day. She does not have any smokeless tobacco history on file. She reports that she drinks alcohol. She reports that she uses illicit drugs (Marijuana and Cocaine).  Allergies:  Allergies  Allergen Reactions  . Bee Venom Anaphylaxis  . Latex Rash     (Not in a hospital admission)  ROS  Blood pressure 98/63, pulse 74, temperature 98.2 F (36.8 C), temperature source Oral, resp. rate 16, height  (1.676 m), weight 204 lb  (92.534 kg), SpO2 100 %. Physical Exam  Constitutional: She appears well-developed and well-nourished. She appears distressed.  see Dr Pati Gallo dx and workup  Results for orders placed or performed during the hospital encounter of 01/29/16 (from the past 24 hour(s))  Lipase, blood     Status: None   Collection Time: 01/29/16  1:11 PM  Result Value Ref Range   Lipase 30 11 - 51 U/L  Comprehensive metabolic panel     Status: Abnormal   Collection Time: 01/29/16  1:11 PM  Result Value Ref Range   Sodium 141 135 - 145 mmol/L   Potassium 3.9 3.5 - 5.1 mmol/L   Chloride 110 101 - 111 mmol/L   CO2 24 22 - 32 mmol/L   Glucose, Bld 94 65 - 99 mg/dL   BUN 11 6 - 20 mg/dL   Creatinine, Ser 8.11 0.44 - 1.00 mg/dL   Calcium 9.2 8.9 - 91.4 mg/dL   Total Protein 7.0 6.5 - 8.1 g/dL   Albumin 4.0 3.5 - 5.0 g/dL   AST 13 (L) 15 - 41 U/L   ALT 24 14 - 54 U/L   Alkaline Phosphatase 75 38 - 126 U/L   Total Bilirubin 0.7 0.3 - 1.2 mg/dL   GFR calc non Af Amer >60 >60 mL/min   GFR calc Af Amer >60 >60 mL/min   Anion gap 7 5 - 15  CBC     Status: Abnormal  Collection Time: 01/29/16  1:11 PM  Result Value Ref Range   WBC 14.1 (H) 4.0 - 10.5 K/uL   RBC 4.72 3.87 - 5.11 MIL/uL   Hemoglobin 14.0 12.0 - 15.0 g/dL   HCT 60.4 54.0 - 98.1 %   MCV 90.5 78.0 - 100.0 fL   MCH 29.7 26.0 - 34.0 pg   MCHC 32.8 30.0 - 36.0 g/dL   RDW 19.1 47.8 - 29.5 %   Platelets 247 150 - 400 K/uL  Urinalysis, Routine w reflex microscopic (not at [facility])     Status: Abnormal   Collection Time: 01/29/16  4:55 PM  Result Value Ref Range   Color, Urine YELLOW YELLOW   APPearance CLOUDY (A) CLEAR   Specific Gravity, Urine >1.030 (H) 1.005 - 1.030   pH 5.5 5.0 - 8.0   Glucose, UA NEGATIVE NEGATIVE mg/dL   Hgb urine dipstick LARGE (A) NEGATIVE   Bilirubin Urine MODERATE (A) NEGATIVE   Ketones, ur 40 (A) NEGATIVE mg/dL   Protein, ur 30 (A) NEGATIVE mg/dL   Nitrite NEGATIVE NEGATIVE   Leukocytes, UA TRACE (A) NEGATIVE   Pregnancy, urine     Status: None   Collection Time: 01/29/16  4:55 PM  Result Value Ref Range   Preg Test, Ur NEGATIVE NEGATIVE  Urine microscopic-add on     Status: Abnormal   Collection Time: 01/29/16  4:55 PM  Result Value Ref Range   Squamous Epithelial / LPF TOO NUMEROUS TO COUNT (A) NONE SEEN   WBC, UA 6-30 0 - 5 WBC/hpf   RBC / HPF TOO NUMEROUS TO COUNT 0 - 5 RBC/hpf   Bacteria, UA MANY (A) NONE SEEN  I-Stat Beta hCG blood, ED ([facility], [facility], [facility] only)     Status: None   Collection Time: 01/29/16  5:08 PM  Result Value Ref Range   I-stat hCG, quantitative <5.0 <5 mIU/mL   Comment 3          Wet prep, genital     Status: Abnormal   Collection Time: 01/29/16  8:46 PM  Result Value Ref Range   Yeast Wet Prep HPF POC NONE SEEN NONE SEEN   Trich, Wet Prep NONE SEEN NONE SEEN   Clue Cells Wet Prep HPF POC NONE SEEN NONE SEEN   WBC, Wet Prep HPF POC FEW (A) NONE SEEN   Sperm NONE SEEN    Gynecology exam  Ct Renal Stone Study  01/29/2016  CLINICAL DATA:  Right flank pain and vomiting starting yesterday at 2 p.m. EXAM: CT ABDOMEN AND PELVIS WITHOUT CONTRAST TECHNIQUE: Multidetector CT imaging of the abdomen and pelvis was performed following the standard protocol without IV contrast. COMPARISON:  03/30/2015 FINDINGS: Lower chest:  Visualized lung bases are unremarkable. Hepatobiliary: Unenhanced liver shows no biliary ductal dilatation. No calcified gallstones are noted within gallbladder. No CBD dilatation. Pancreas: Unenhanced pancreas is unremarkable. Spleen: Unenhanced spleen is unremarkable Adrenals/Urinary Tract: No adrenal gland mass is noted. Again noted bilateral mid pole renal cortical scarring. No hydronephrosis or hydroureter. A cortical calcification in midpole of the right kidney measures 4 mm stable from prior exam. Stable punctate cortical calcification in midpole of the right kidney medially measures about 1 mm. No hydronephrosis or hydroureter. No calcified ureteral calculi are  noted bilaterally. Bilateral distal ureter is unremarkable. No calcified calculi are noted within urinary bladder. Stomach/Bowel: No thickened or dilated small bowel loops are noted. No small bowel obstruction. There is no evidence of gastric outlet obstruction. Some colonic gas and  stool noted in right colon. The terminal ileum is unremarkable. Normal appendix is noted in axial image 66. No pericecal inflammation. There is no distal colonic obstruction. Vascular/Lymphatic: No aortic aneurysm. No mesenteric of retroperitoneal adenopathy. Few small nonspecific lymph nodes are noted in right lower quadrant mesentery. Bilateral small nonspecific inguinal lymph nodes are noted. Reproductive: The uterus is anteflexed. There is a right ovarian simple cyst measures 3.8 by 3.5 cm. No pelvic free fluid is noted. Other: No ascites or free air. Musculoskeletal: Sagittal images of the spine shows no destructive bony lesions. Alignment, disc spaces and vertebral body heights are preserved. IMPRESSION: 1. Again noted bilateral renal cortical scarring. Stable cortical calcifications in right kidney. No hydronephrosis or hydroureter. 2. No calcified ureteral calculi. 3. No small bowel obstruction. No pericecal inflammation. Normal appendix. 4. There is a right ovarian cyst measures 3.8 x 3.5 cm. Anteflexed uterus. No pelvic free fluid. 5. No calcified calculi are noted within urinary bladder. Electronically Signed   By: Natasha MeadLiviu  Pop M.D.   On: 01/29/2016 18:43    Assessment/Plan: Acute PID, for inpt care initially , til care as outpt is an option. Doxy + Mefoxin  Christohper Dube V 01/29/2016, 9:42 PM

## 2016-01-29 NOTE — ED Provider Notes (Signed)
CSN: [id number]     Arrival date & time 01/29/16  1235 History   First MD Initiated Contact with Patient 01/29/16 1637     Chief Complaint  Patient presents with  . Abdominal Pain  . Vaginal Bleeding     (Consider location/radiation/quality/duration/timing/severity/associated sxs/prior Treatment) Patient is a 20 y.o. female presenting with abdominal pain and vaginal bleeding. The history is provided by the patient (Patient complains of lower abdominal pain for 2 days with some nausea vomiting).  Abdominal Pain Pain location:  Periumbilical Pain quality: aching   Pain radiates to:  Does not radiate Onset quality:  Sudden Timing:  Constant Progression:  Worsening Chronicity:  New Associated symptoms: vaginal bleeding   Associated symptoms: no chest pain, no cough, no diarrhea, no fatigue and no hematuria   Vaginal Bleeding Associated symptoms: abdominal pain   Associated symptoms: no back pain and no fatigue     Past Medical History  Diagnosis Date  . ADHD (attention deficit hyperactivity disorder)   . PTSD (post-traumatic stress disorder)   . ODD (oppositional defiant disorder)   . Asthma   . Anxiety   . Allergy   . Eating disorder   . QIONGEXB(284.1)    Past Surgical History  Procedure Laterality Date  . Tonsillectomy     History reviewed. No pertinent family history. Social History  Substance Use Topics  . Smoking status: Current Every Day Smoker -- 1.00 packs/day    Types: Cigarettes    Last Attempt to Quit: 07/21/2013  . Smokeless tobacco: None  . Alcohol Use: Yes     Comment: 3-4 glassed vodka/beer   OB History    No data available     Review of Systems  Constitutional: Negative for appetite change and fatigue.  HENT: Negative for congestion, ear discharge and sinus pressure.   Eyes: Negative for discharge.  Respiratory: Negative for cough.   Cardiovascular: Negative for chest pain.  Gastrointestinal: Positive for abdominal pain. Negative for diarrhea.   Genitourinary: Positive for vaginal bleeding. Negative for frequency and hematuria.  Musculoskeletal: Negative for back pain.  Skin: Negative for rash.  Neurological: Negative for seizures and headaches.  Psychiatric/Behavioral: Negative for hallucinations.      Allergies  Bee venom and Latex  Home Medications   Prior to Admission medications   Medication Sig Start Date End Date Taking? Authorizing Provider  acetaminophen (TYLENOL) 500 MG tablet Take 1 tablet (500 mg total) by mouth every 6 (six) hours as needed for mild pain. 01/09/16  Yes Standley Brooking, MD  levETIRAcetam (KEPPRA) 500 MG tablet Take 1 tablet (500 mg total) by mouth 2 (two) times daily. 01/06/16  Yes Kristen N Ward, DO  albuterol (PROVENTIL HFA;VENTOLIN HFA) 108 (90 Base) MCG/ACT inhaler Inhale 2 puffs into the lungs every 6 (six) hours as needed for wheezing. Patient may resume home supply. 01/09/16   Standley Brooking, MD  beclomethasone (QVAR) 80 MCG/ACT inhaler Inhale 1 puff into the lungs 2 (two) times daily. Patient not taking: Reported on 01/07/2016 10/11/14   Gwyneth Sprout, MD  EPINEPHrine 0.3 mg/0.3 mL IJ SOAJ injection Inject 0.3 mg into the muscle as needed (Bee Stings).    Historical Provider, MD   BP 93/61 mmHg  Pulse 87  Temp(Src) 98.2 F (36.8 C) (Oral)  Resp 17  Ht  (1.676 m)  Wt 204 lb (92.534 kg)  BMI 32.94 kg/m2  SpO2 100%  LMP  Physical Exam  Constitutional: She is oriented to person, place, and time. She  appears well-developed.  HENT:  Head: Normocephalic.  Eyes: Conjunctivae and EOM are normal. No scleral icterus.  Neck: Neck supple. No thyromegaly present.  Cardiovascular: Normal rate and regular rhythm.  Exam reveals no gallop and no friction rub.   No murmur heard. Pulmonary/Chest: No stridor. She has no wheezes. She has no rales. She exhibits no tenderness.  Abdominal: She exhibits no distension. There is tenderness. There is no rebound.  Moderate suprapubic tenderness   Genitourinary:  Patient has a bloody discharge very tender cervix and bilateral adnexal tenderness  Musculoskeletal: Normal range of motion. She exhibits no edema.  Lymphadenopathy:    She has no cervical adenopathy.  Neurological: She is oriented to person, place, and time. She exhibits normal muscle tone. Coordination normal.  Skin: No rash noted. No erythema.  Psychiatric: She has a normal mood and affect. Her behavior is normal.    ED Course  Procedures (including critical care time) Labs Review Labs Reviewed  WET PREP, GENITAL - Abnormal; Notable for the following:    WBC, Wet Prep HPF POC FEW (*)    All other components within normal limits  COMPREHENSIVE METABOLIC PANEL - Abnormal; Notable for the following:    AST 13 (*)    All other components within normal limits  CBC - Abnormal; Notable for the following:    WBC 14.1 (*)    All other components within normal limits  URINALYSIS, ROUTINE W REFLEX MICROSCOPIC (NOT AT [facility]) - Abnormal; Notable for the following:    APPearance CLOUDY (*)    Specific Gravity, Urine >1.030 (*)    Hgb urine dipstick LARGE (*)    Bilirubin Urine MODERATE (*)    Ketones, ur 40 (*)    Protein, ur 30 (*)    Leukocytes, UA TRACE (*)    All other components within normal limits  URINE MICROSCOPIC-ADD ON - Abnormal; Notable for the following:    Squamous Epithelial / LPF TOO NUMEROUS TO COUNT (*)    Bacteria, UA MANY (*)    All other components within normal limits  URINE CULTURE  LIPASE, BLOOD  PREGNANCY, URINE  I-STAT BETA HCG BLOOD, ED ([facility], [facility], [facility] ONLY)  GC/CHLAMYDIA PROBE AMP ([place]) NOT AT [facility]    Imaging Review Ct Renal Stone Study  01/29/2016  CLINICAL DATA:  Right flank pain and vomiting starting yesterday at 2 p.m. EXAM: CT ABDOMEN AND PELVIS WITHOUT CONTRAST TECHNIQUE: Multidetector CT imaging of the abdomen and pelvis was performed following the standard protocol without IV contrast. COMPARISON:  03/30/2015 FINDINGS: Lower  chest:  Visualized lung bases are unremarkable. Hepatobiliary: Unenhanced liver shows no biliary ductal dilatation. No calcified gallstones are noted within gallbladder. No CBD dilatation. Pancreas: Unenhanced pancreas is unremarkable. Spleen: Unenhanced spleen is unremarkable Adrenals/Urinary Tract: No adrenal gland mass is noted. Again noted bilateral mid pole renal cortical scarring. No hydronephrosis or hydroureter. A cortical calcification in midpole of the right kidney measures 4 mm stable from prior exam. Stable punctate cortical calcification in midpole of the right kidney medially measures about 1 mm. No hydronephrosis or hydroureter. No calcified ureteral calculi are noted bilaterally. Bilateral distal ureter is unremarkable. No calcified calculi are noted within urinary bladder. Stomach/Bowel: No thickened or dilated small bowel loops are noted. No small bowel obstruction. There is no evidence of gastric outlet obstruction. Some colonic gas and stool noted in right colon. The terminal ileum is unremarkable. Normal appendix is noted in axial image 66. No pericecal inflammation. There is no distal colonic  obstruction. Vascular/Lymphatic: No aortic aneurysm. No mesenteric of retroperitoneal adenopathy. Few small nonspecific lymph nodes are noted in right lower quadrant mesentery. Bilateral small nonspecific inguinal lymph nodes are noted. Reproductive: The uterus is anteflexed. There is a right ovarian simple cyst measures 3.8 by 3.5 cm. No pelvic free fluid is noted. Other: No ascites or free air. Musculoskeletal: Sagittal images of the spine shows no destructive bony lesions. Alignment, disc spaces and vertebral body heights are preserved. IMPRESSION: 1. Again noted bilateral renal cortical scarring. Stable cortical calcifications in right kidney. No hydronephrosis or hydroureter. 2. No calcified ureteral calculi. 3. No small bowel obstruction. No pericecal inflammation. Normal appendix. 4. There is a  right ovarian cyst measures 3.8 x 3.5 cm. Anteflexed uterus. No pelvic free fluid. 5. No calcified calculi are noted within urinary bladder. Electronically Signed   By: Natasha MeadLiviu  Pop M.D.   On: 01/29/2016 18:43   I have personally reviewed and evaluated these images and lab results as part of my medical decision-making.   EKG Interpretation None      MDM   Final diagnoses:  Pain  PID (acute pelvic inflammatory disease)    Patient will be admitted to GYN for PID    Bethann BerkshireJoseph Dara Camargo, MD 01/29/16 2104

## 2016-01-30 DIAGNOSIS — N939 Abnormal uterine and vaginal bleeding, unspecified: Secondary | ICD-10-CM

## 2016-01-30 DIAGNOSIS — R102 Pelvic and perineal pain: Secondary | ICD-10-CM

## 2016-01-30 DIAGNOSIS — R110 Nausea: Secondary | ICD-10-CM

## 2016-01-30 LAB — CBC WITH DIFFERENTIAL/PLATELET
Basophils Absolute: 0 10*3/uL (ref 0.0–0.1)
Basophils Relative: 0 %
Eosinophils Absolute: 0.2 10*3/uL (ref 0.0–0.7)
Eosinophils Relative: 2 %
HCT: 36.5 % (ref 36.0–46.0)
Hemoglobin: 12 g/dL (ref 12.0–15.0)
Lymphocytes Relative: 29 %
Lymphs Abs: 3.1 10*3/uL (ref 0.7–4.0)
MCH: 29.9 pg (ref 26.0–34.0)
MCHC: 32.9 g/dL (ref 30.0–36.0)
MCV: 90.8 fL (ref 78.0–100.0)
Monocytes Absolute: 0.9 10*3/uL (ref 0.1–1.0)
Monocytes Relative: 8 %
Neutro Abs: 6.3 10*3/uL (ref 1.7–7.7)
Neutrophils Relative %: 61 %
Platelets: 202 10*3/uL (ref 150–400)
RBC: 4.02 MIL/uL (ref 3.87–5.11)
RDW: 12.6 % (ref 11.5–15.5)
WBC: 10.5 10*3/uL (ref 4.0–10.5)

## 2016-01-30 MED ORDER — OXYCODONE-ACETAMINOPHEN 5-325 MG PO TABS
2.0000 | ORAL_TABLET | Freq: Four times a day (QID) | ORAL | Status: DC | PRN
  Administered 2016-01-30: 2 via ORAL
  Filled 2016-01-30 (×2): qty 2

## 2016-01-30 MED ORDER — NICOTINE 21 MG/24HR TD PT24
21.0000 mg | MEDICATED_PATCH | Freq: Every day | TRANSDERMAL | Status: DC
  Administered 2016-01-30 – 2016-01-31 (×2): 21 mg via TRANSDERMAL
  Filled 2016-01-30 (×2): qty 1

## 2016-01-30 MED ORDER — KETOROLAC TROMETHAMINE 30 MG/ML IJ SOLN
30.0000 mg | Freq: Four times a day (QID) | INTRAMUSCULAR | Status: AC
  Administered 2016-01-30 – 2016-01-31 (×5): 30 mg via INTRAVENOUS
  Filled 2016-01-30 (×5): qty 1

## 2016-01-30 MED ORDER — DOXYCYCLINE HYCLATE 100 MG IV SOLR
INTRAVENOUS | Status: AC
  Filled 2016-01-30: qty 100

## 2016-01-30 MED ORDER — DOXYCYCLINE HYCLATE 100 MG PO TABS
100.0000 mg | ORAL_TABLET | Freq: Two times a day (BID) | ORAL | Status: DC
  Administered 2016-01-30 – 2016-01-31 (×3): 100 mg via ORAL
  Filled 2016-01-30 (×3): qty 1

## 2016-01-30 NOTE — Clinical Social Work Note (Signed)
Clinical Social Work Assessment  Patient Details  Name: Erin Krueger MRN: [id number] Date of Birth: 09-24-1996  Date of referral:  01/30/16               Reason for consult:   Erin Krueger(Community Resources)                Permission sought to share information with:    Permission granted to share information::     Name::        Agency::     Relationship::     Contact Information:     Housing/Transportation Living arrangements for the past 2 months:  Single Family Home Source of Information:  Patient (Boyfriend, [name] Patient Interpreter Needed:  None Criminal Activity/Legal Involvement Pertinent to Current Situation/Hospitalization:  No - Comment as needed Significant Relationships:  Significant Other Lives with:  Significant Other Do you feel safe going back to the place where you live?  Yes Need for family participation in patient care:  Yes (Comment)  Care giving concerns:  None identified.    Social Worker assessment / plan:  Patient indicated that she has housing, however she has no electricity nor water.  She stated that she and her boyfriend, Erin Krueger, who was at bedside live in the home together. Erin Krueger identified that he was unemployed but currently seeking employment. Patient stated that she receives $490.00 per month in disability and that their rent is $375.00.  She stated that she needs $150.00 deposit to have her electricity turned on and a $90.00 deposit to have her water turned on. Patient stated that she receives disability because "I have the mind of a 20 year old."  CSW provided patient with resources for housing, utilities assistance, transportation, substance abuse, behavioral health, and food.  Patient stated that she would contact these places to see if they could assist her with her utilities.  CSW signing off.   Employment status:  Disabled (Comment on whether or not currently receiving Disability) Insurance information[place]  [place]:  Not  assessed at this time Information / Referral to community resources:  Shelter, Outpatient Psychiatric Care (Comment Required), Other (Comment Required) (Utilities resources, food resources, transportation resources, SA resources, housing resources, behavioral health resources)  Patient/Family's Response to care:  Patient is accepting of resources given.   Patient/Family's Understanding of and Emotional Response to Diagnosis, Current Treatment, and Prognosis:  Patient is aware of diagnosis, treatment and prognosis.   Emotional Assessment Appearance:  Appears older than stated age Attitude/Demeanor/Rapport:   (Cooperative) Affect (typically observed):  Accepting Orientation:  Oriented to Self, Oriented to Place, Oriented to  Time, Oriented to Situation Alcohol / Substance use:  Not Applicable Psych involvement (Current and /or in the community):  No (Comment)  Discharge Needs  Concerns to be addressed:  Basic Needs Readmission within the last 30 days:  No Current discharge risk:  Inadequate Financial Supports Barriers to Discharge:  No Barriers Identified   Erin Krueger, Erin Pellegrin D, LCSW 01/30/2016, 1:53 PM
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
## 2016-01-31 ENCOUNTER — Inpatient Hospital Stay (HOSPITAL_COMMUNITY): Payer: Medicaid Other

## 2016-01-31 LAB — COMPREHENSIVE METABOLIC PANEL
ALT: 17 U/L (ref 14–54)
AST: 13 U/L — ABNORMAL LOW (ref 15–41)
Albumin: 3.5 g/dL (ref 3.5–5.0)
Alkaline Phosphatase: 62 U/L (ref 38–126)
Anion gap: 7 (ref 5–15)
BUN: 7 mg/dL (ref 6–20)
CO2: 26 mmol/L (ref 22–32)
Calcium: 9 mg/dL (ref 8.9–10.3)
Chloride: 107 mmol/L (ref 101–111)
Creatinine, Ser: 0.68 mg/dL (ref 0.44–1.00)
GFR calc Af Amer: >60 mL/min (ref >60)
GFR calc non Af Amer: >60 mL/min (ref >60)
Glucose, Bld: 89 mg/dL (ref 65–99)
Potassium: 3.8 mmol/L (ref 3.5–5.1)
Sodium: 140 mmol/L (ref 135–145)
Total Bilirubin: 0.4 mg/dL (ref 0.3–1.2)
Total Protein: 6.3 g/dL — ABNORMAL LOW (ref 6.5–8.1)

## 2016-01-31 LAB — URINE CULTURE

## 2016-01-31 LAB — AMYLASE: Amylase: 51 U/L (ref 28–100)

## 2016-01-31 LAB — GC/CHLAMYDIA PROBE AMP (~~LOC~~) NOT AT ARMC
Chlamydia: NEGATIVE
Neisseria Gonorrhea: NEGATIVE

## 2016-01-31 MED ORDER — DOXYCYCLINE HYCLATE 100 MG PO TABS: 100.0000 mg | ORAL_TABLET | Freq: Two times a day (BID) | ORAL | Status: DC

## 2016-01-31 MED ORDER — NORGESTIMATE-ETH ESTRADIOL 0.25-35 MG-MCG PO TABS: 1.0000 | ORAL_TABLET | Freq: Every day | ORAL | Status: DC

## 2016-01-31 MED ORDER — LEVETIRACETAM 500 MG PO TABS: 500.0000 mg | ORAL_TABLET | Freq: Two times a day (BID) | ORAL | Status: DC

## 2016-01-31 MED ORDER — TRAMADOL HCL 50 MG PO TABS: 50.0000 mg | ORAL_TABLET | Freq: Four times a day (QID) | ORAL | Status: DC | PRN
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
## 2016-01-31 NOTE — Discharge Summary (Signed)
Physician Discharge Summary  Patient ID: Shakeia Krus MRN: [id number] DOB/AGE: 1996-06-02 20 y.o.  Admit date: 01/29/2016 Discharge date: 01/31/2016  Admission Diagnoses: Acute PID, dysmenorrhea, seizure disorder, anxiety disorder, limited mental function  Discharge Diagnoses: Same, Active Problems:   PID (acute pelvic inflammatory disease)  PID resolved  Discharged Condition: good  Hospital Course: This 20 year old female, recently relocated to [place] from [facility], using Nexplanon contraception 4 years, 1 year then recommended length of time, began her first period in 4 years 2 days before presentation to the [facility] in acute lower abdominal pain interpreted as acute PID when admitted Wednesday night when admitted with hemoglobin 14, white count 14,100  mildly elevated, with evaluation notable for dramatic anxiety, and dysmenorrhea, patient was admitted for presumed PID treatment  Consults: gynecology hospital course was further notable for for challenging assessment as the patient would not wake up and cooperate with exam each morning. On second day she complained of upper abdominal discomfort and was worked up for biliary abnormalities with negative workup  Significant Diagnostic Studies: labs:  CBC Latest Ref Rng 01/30/2016 01/29/2016 01/10/2016  WBC 4.0 - 10.5 K/uL 10.5 14.1(H) 11.9(H)  Hemoglobin 12.0 - 15.0 g/dL 40.9 81.1 91.4  Hematocrit 36.0 - 46.0 % 36.5 42.7 41.6  Platelets 150 - 400 K/uL 202 247 255      Treatments: IV hydration and antibiotics: ceftriaxone and doxycycline the patient had vague abdominal complaints which are evaluated with her right upper quadrant ultrasound which was negative as was lipase and metabolic panel  Discharge Exam: Blood pressure 112/59, pulse 81, temperature 98.8 F (37.1 C), temperature source Oral, resp. rate 20, height  (1.676 m), weight 180 lb 1.9 oz (81.7 kg), SpO2 99 %. General appearance: alert,  appears stated age, slowed mentation and Somewhat immature personality, as expected this patient is on disability for seizure disorder with a self-described "20 year old mind" Head: Normocephalic, without obvious abnormality, atraumatic Resp: clear to auscultation bilaterally GI: soft, non-tender; bowel sounds normal; no masses,  no organomegaly Disposition:   Discharge Instructions    Call MD for:  severe uncontrolled pain    Complete by:  As directed      Call MD for:  temperature >100.4    Complete by:  As directed      Diet - low sodium heart healthy    Complete by:  As directed      Increase activity slowly    Complete by:  As directed             Medication List    TAKE these medications        acetaminophen 500 MG tablet  Commonly known as:  TYLENOL  Take 1 tablet (500 mg total) by mouth every 6 (six) hours as needed for mild pain.     albuterol 108 (90 Base) MCG/ACT inhaler  Commonly known as:  PROVENTIL HFA;VENTOLIN HFA  Inhale 2 puffs into the lungs every 6 (six) hours as needed for wheezing. Patient may resume home supply.     beclomethasone 80 MCG/ACT inhaler  Commonly known as:  QVAR  Inhale 1 puff into the lungs 2 (two) times daily.     doxycycline 100 MG tablet  Commonly known as:  VIBRA-TABS  Take 1 tablet (100 mg total) by mouth every 12 (twelve) hours.     EPINEPHrine 0.3 mg/0.3 mL Soaj injection  Commonly known as:  EPI-PEN  Inject 0.3 mg into the muscle as needed (Bee Stings).  levETIRAcetam 500 MG tablet  Commonly known as:  KEPPRA  Take 1 tablet (500 mg total) by mouth 2 (two) times daily.     levETIRAcetam 500 MG tablet  Commonly known as:  KEPPRA  Take 1 tablet (500 mg total) by mouth 2 (two) times daily.     norgestimate-ethinyl estradiol 0.25-35 MG-MCG tablet  Commonly known as:  ORTHO-CYCLEN,SPRINTEC,PREVIFEM  Take 1 tablet by mouth daily.     traMADol 50 MG tablet  Commonly known as:  ULTRAM  Take 1 tablet (50 mg total) by mouth  every 6 (six) hours as needed for moderate pain or severe pain.        Plans are for the patient to begin birth control pills at this time, presuming that the Nexplanon was no longer effective, with the patient follow-up in 2 weeks by office for consideration of removal Nexplanon. Additionally the patient will be continued on doxycycline for 10 days and was given tramadol for pain due to low pain tolerance. GC and Chlamydia cultures have returned negative so it is possible that this was dysmenorrhea and a dramatic personality. Follow-up [facility] 2 weeks Signed: Tilda BurrowFERGUSON,Brenleigh Collet V 01/31/2016, 4:19 PM

## 2016-01-31 NOTE — Progress Notes (Signed)
Subjective: Patient reports nausea, + flatus, + BM and no problems voiding.  She says the pelvic pain is improved, and she has more epigastric pain.  Objective: I have reviewed patient's vital signs, intake and output, medications and labs.  General: cooperative, distracted, slowed mentation and sleeping comfortably , snuggled with boyfriend, does not move willingly to aid in exam. Resp: clear to auscultation bilaterally GI: soft, non-tender; bowel sounds normal; no masses,  no organomegaly and abnormal findings:  mild epigastric tenderness to deep palpation, no rebound Vaginal Bleeding: minimal bleeding per pt.   Assessment/Plan: 1 Resolving dysmenorrhea, PID 2 Epigastric discomfort will check amylase LFT, abd u/s and f/u as outpt.   Anticipate d/c later today.   LOS: 2 days    Jerolene Kupfer V 01/31/2016, 8:15 AM

## 2016-02-06 ENCOUNTER — Emergency Department (HOSPITAL_COMMUNITY)
Admission: EM | Admit: 2016-02-06 | Discharge: 2016-02-07 | Disposition: A | Discharge status: Home / Self Care | Payer: Medicaid Other | Attending: Emergency Medicine | Admitting: Emergency Medicine

## 2016-02-06 ENCOUNTER — Encounter (HOSPITAL_COMMUNITY): Payer: Self-pay | Admitting: *Deleted

## 2016-02-06 DIAGNOSIS — F17210 Nicotine dependence, cigarettes, uncomplicated: Secondary | ICD-10-CM | POA: Insufficient documentation

## 2016-02-06 DIAGNOSIS — R0602 Shortness of breath: Secondary | ICD-10-CM | POA: Diagnosis present

## 2016-02-06 DIAGNOSIS — F4310 Post-traumatic stress disorder, unspecified: Secondary | ICD-10-CM | POA: Diagnosis not present

## 2016-02-06 DIAGNOSIS — Z5321 Procedure and treatment not carried out due to patient leaving prior to being seen by health care provider: Secondary | ICD-10-CM | POA: Diagnosis not present

## 2016-02-06 DIAGNOSIS — J441 Chronic obstructive pulmonary disease with (acute) exacerbation: Secondary | ICD-10-CM | POA: Insufficient documentation

## 2016-02-06 DIAGNOSIS — T59811A Toxic effect of smoke, accidental (unintentional), initial encounter: Secondary | ICD-10-CM

## 2016-02-06 DIAGNOSIS — J705 Respiratory conditions due to smoke inhalation: Secondary | ICD-10-CM

## 2016-02-06 DIAGNOSIS — J4521 Mild intermittent asthma with (acute) exacerbation: Secondary | ICD-10-CM
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
## 2016-02-07 ENCOUNTER — Emergency Department (HOSPITAL_COMMUNITY): Payer: Medicaid Other

## 2016-02-07 ENCOUNTER — Encounter (HOSPITAL_COMMUNITY): Payer: Self-pay

## 2016-02-07 DIAGNOSIS — F17210 Nicotine dependence, cigarettes, uncomplicated: Secondary | ICD-10-CM | POA: Insufficient documentation

## 2016-02-07 DIAGNOSIS — Z5321 Procedure and treatment not carried out due to patient leaving prior to being seen by health care provider: Secondary | ICD-10-CM | POA: Insufficient documentation

## 2016-02-07 DIAGNOSIS — J45909 Unspecified asthma, uncomplicated: Secondary | ICD-10-CM | POA: Diagnosis not present

## 2016-02-07 DIAGNOSIS — F4310 Post-traumatic stress disorder, unspecified: Secondary | ICD-10-CM | POA: Insufficient documentation

## 2016-02-07 DIAGNOSIS — R1110 Vomiting, unspecified: Secondary | ICD-10-CM | POA: Insufficient documentation

## 2016-02-07 DIAGNOSIS — R109 Unspecified abdominal pain: Secondary | ICD-10-CM | POA: Diagnosis present

## 2016-02-07 LAB — BASIC METABOLIC PANEL
Anion gap: 6 (ref 5–15)
BUN: 10 mg/dL (ref 6–20)
CO2: 27 mmol/L (ref 22–32)
Calcium: 9 mg/dL (ref 8.9–10.3)
Chloride: 107 mmol/L (ref 101–111)
Creatinine, Ser: 0.7 mg/dL (ref 0.44–1.00)
GFR calc Af Amer: >60 mL/min (ref >60)
GFR calc non Af Amer: >60 mL/min (ref >60)
Glucose, Bld: 144 mg/dL — ABNORMAL HIGH (ref 65–99)
Potassium: 3.1 mmol/L — ABNORMAL LOW (ref 3.5–5.1)
Sodium: 140 mmol/L (ref 135–145)

## 2016-02-07 LAB — CBC WITH DIFFERENTIAL/PLATELET
Basophils Absolute: 0 10*3/uL (ref 0.0–0.1)
Basophils Relative: 0 %
Eosinophils Absolute: 0.2 10*3/uL (ref 0.0–0.7)
Eosinophils Relative: 3 %
HCT: 41.3 % (ref 36.0–46.0)
Hemoglobin: 13.7 g/dL (ref 12.0–15.0)
Lymphocytes Relative: 43 %
Lymphs Abs: 3.6 10*3/uL (ref 0.7–4.0)
MCH: 30.1 pg (ref 26.0–34.0)
MCHC: 33.2 g/dL (ref 30.0–36.0)
MCV: 90.8 fL (ref 78.0–100.0)
Monocytes Absolute: 0.5 10*3/uL (ref 0.1–1.0)
Monocytes Relative: 7 %
Neutro Abs: 4 10*3/uL (ref 1.7–7.7)
Neutrophils Relative %: 47 %
Platelets: 244 10*3/uL (ref 150–400)
RBC: 4.55 MIL/uL (ref 3.87–5.11)
RDW: 12.7 % (ref 11.5–15.5)
WBC: 8.4 10*3/uL (ref 4.0–10.5)
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
## 2016-02-07 MED ORDER — ONDANSETRON HCL 4 MG/2ML IJ SOLN
INTRAMUSCULAR | Status: AC
  Filled 2016-02-07: qty 2

## 2016-02-07 MED ORDER — ONDANSETRON HCL 4 MG/2ML IJ SOLN: 4.0000 mg | Freq: Once | INTRAMUSCULAR | Status: DC

## 2016-02-07 MED ORDER — PREDNISONE 20 MG PO TABS: 60.0000 mg | ORAL_TABLET | Freq: Every day | ORAL | Status: DC

## 2016-02-07 MED ORDER — ETODOLAC 400 MG PO TABS: 400.0000 mg | ORAL_TABLET | Freq: Two times a day (BID) | ORAL | Status: DC | PRN

## 2016-02-07 MED ORDER — ALBUTEROL SULFATE HFA 108 (90 BASE) MCG/ACT IN AERS: 2.0000 | INHALATION_SPRAY | RESPIRATORY_TRACT | Status: DC | PRN

## 2016-02-07 MED ORDER — MORPHINE SULFATE (PF) 4 MG/ML IV SOLN
INTRAVENOUS | Status: AC
  Filled 2016-02-07: qty 1

## 2016-02-07 MED ORDER — MORPHINE SULFATE (PF) 4 MG/ML IV SOLN
4.0000 mg | Freq: Once | INTRAVENOUS | Status: AC
  Administered 2016-02-07: 4 mg via INTRAVENOUS

## 2016-02-07 MED ORDER — METHYLPREDNISOLONE SODIUM SUCC 125 MG IJ SOLR
125.0000 mg | Freq: Once | INTRAMUSCULAR | Status: AC
  Administered 2016-02-07: 125 mg via INTRAVENOUS
  Filled 2016-02-07: qty 2

## 2016-02-07 NOTE — Discharge Instructions (Signed)
Asthma, Adult Asthma is a recurring condition in which the airways tighten and narrow. Asthma can make it difficult to breathe. It can cause coughing, wheezing, and shortness of breath. Asthma episodes, also called asthma attacks, range from minor to life-threatening. Asthma cannot be cured, but medicines and lifestyle changes can help control it. CAUSES Asthma is believed to be caused by inherited (genetic) and environmental factors, but its exact cause is unknown. Asthma may be triggered by allergens, lung infections, or irritants in the air. Asthma triggers are different for each person. Common triggers include:   Animal dander.  Dust mites.  Cockroaches.  Pollen from trees or grass.  Mold.  Smoke.  Air pollutants such as dust, household cleaners, hair sprays, aerosol sprays, paint fumes, strong chemicals, or strong odors.  Cold air, weather changes, and winds (which increase molds and pollens in the air).  Strong emotional expressions such as crying or laughing hard.  Stress.  Certain medicines (such as aspirin) or types of drugs (such as beta-blockers).  Sulfites in foods and drinks. Foods and drinks that may contain sulfites include dried fruit, potato chips, and sparkling grape juice.  Infections or inflammatory conditions such as the flu, a cold, or an inflammation of the nasal membranes (rhinitis).  Gastroesophageal reflux disease (GERD).  Exercise or strenuous activity. SYMPTOMS Symptoms may occur immediately after asthma is triggered or many hours later. Symptoms include:  Wheezing.  Excessive nighttime or early morning coughing.  Frequent or severe coughing with a common cold.  Chest tightness.  Shortness of breath. DIAGNOSIS  The diagnosis of asthma is made by a review of your medical history and a physical exam. Tests may also be performed. These may include:  Lung function studies. These tests show how much air you breathe in and out.  Allergy  tests.  Imaging tests such as X-rays. TREATMENT  Asthma cannot be cured, but it can usually be controlled. Treatment involves identifying and avoiding your asthma triggers. It also involves medicines. There are 2 classes of medicine used for asthma treatment:   Controller medicines. These prevent asthma symptoms from occurring. They are usually taken every day.  Reliever or rescue medicines. These quickly relieve asthma symptoms. They are used as needed and provide short-term relief. Your health care provider will help you create an asthma action plan. An asthma action plan is a written plan for managing and treating your asthma attacks. It includes a list of your asthma triggers and how they may be avoided. It also includes information on when medicines should be taken and when their dosage should be changed. An action plan may also involve the use of a device called a peak flow meter. A peak flow meter measures how well the lungs are working. It helps you monitor your condition. HOME CARE INSTRUCTIONS   Take medicines only as directed by your health care provider. Speak with your health care provider if you have questions about how or when to take the medicines.  Use a peak flow meter as directed by your health care provider. Record and keep track of readings.  Understand and use the action plan to help minimize or stop an asthma attack without needing to seek medical care.  Control your home environment in the following ways to help prevent asthma attacks:  Do not smoke. Avoid being exposed to secondhand smoke.  Change your heating and air conditioning filter regularly.  Limit your use of fireplaces and wood stoves.  Get rid of pests (such as roaches   and mice) and their droppings.  Throw away plants if you see mold on them.  Clean your floors and dust regularly. Use unscented cleaning products.  Try to have someone else vacuum for you regularly. Stay out of rooms while they are  being vacuumed and for a short while afterward. If you vacuum, use a dust mask from a hardware store, a double-layered or microfilter vacuum cleaner bag, or a vacuum cleaner with a HEPA filter.  Replace carpet with wood, tile, or vinyl flooring. Carpet can trap dander and dust.  Use allergy-proof pillows, mattress covers, and box spring covers.  Wash bed sheets and blankets every week in hot water and dry them in a dryer.  Use blankets that are made of polyester or cotton.  Clean bathrooms and kitchens with bleach. If possible, have someone repaint the walls in these rooms with mold-resistant paint. Keep out of the rooms that are being cleaned and painted.  Wash hands frequently. SEEK MEDICAL CARE IF:   You have wheezing, shortness of breath, or a cough even if taking medicine to prevent attacks.  The colored mucus you cough up (sputum) is thicker than usual.  Your sputum changes from clear or white to yellow, green, gray, or bloody.  You have any problems that may be related to the medicines you are taking (such as a rash, itching, swelling, or trouble breathing).  You are using a reliever medicine more than 2-3 times per week.  Your peak flow is still at 50-79% of your personal best after following your action plan for 1 hour.  You have a fever. SEEK IMMEDIATE MEDICAL CARE IF:   You seem to be getting worse and are unresponsive to treatment during an asthma attack.  You are short of breath even at rest.  You get short of breath when doing very little physical activity.  You have difficulty eating, drinking, or talking due to asthma symptoms.  You develop chest pain.  You develop a fast heartbeat.  You have a bluish color to your lips or fingernails.  You are light-headed, dizzy, or faint.  Your peak flow is less than 50% of your personal best.   This information is not intended to replace advice given to you by your health care provider. Make sure you discuss any  questions you have with your health care provider.   Document Released: 11/09/2005 Document Revised: 07/31/2015 Document Reviewed: 06/08/2013 Elsevier Interactive Patient Education 2016 [employer] Inc[employer]  

## 2016-02-07 NOTE — ED Notes (Signed)
My stomach started hurting around 3 pm today. Patient states that she had diarrhea yesterday. States that she was vomiting today. States that she did not tell the doctor that she had diarrhea yesterday because that is not what she was here for yesterday.

## 2016-02-08 ENCOUNTER — Emergency Department (HOSPITAL_COMMUNITY)
Admission: EM | Admit: 2016-02-08 | Discharge: 2016-02-08 | Disposition: A | Discharge status: Home / Self Care | Payer: Medicaid Other | Attending: Emergency Medicine | Admitting: Emergency Medicine
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
## 2016-02-08 NOTE — ED Notes (Signed)
No answer in waiting areas.

## 2016-02-11 ENCOUNTER — Encounter (HOSPITAL_COMMUNITY): Payer: Self-pay | Admitting: *Deleted

## 2016-02-11 ENCOUNTER — Emergency Department (HOSPITAL_COMMUNITY)
Admission: EM | Admit: 2016-02-11 | Discharge: 2016-02-11 | Disposition: A | Discharge status: Home / Self Care | Payer: Medicaid Other | Attending: Emergency Medicine | Admitting: Emergency Medicine

## 2016-02-11 DIAGNOSIS — L299 Pruritus, unspecified: Secondary | ICD-10-CM | POA: Diagnosis present

## 2016-02-11 DIAGNOSIS — J45909 Unspecified asthma, uncomplicated: Secondary | ICD-10-CM | POA: Insufficient documentation

## 2016-02-11 DIAGNOSIS — T7840XA Allergy, unspecified, initial encounter: Secondary | ICD-10-CM

## 2016-02-11 DIAGNOSIS — F17210 Nicotine dependence, cigarettes, uncomplicated: Secondary | ICD-10-CM | POA: Insufficient documentation

## 2016-02-11 MED ORDER — FAMOTIDINE IN NACL 20-0.9 MG/50ML-% IV SOLN
20.0000 mg | Freq: Once | INTRAVENOUS | Status: AC
  Administered 2016-02-11: 20 mg via INTRAVENOUS
  Filled 2016-02-11: qty 50

## 2016-02-11 MED ORDER — HYDROXYZINE HCL 25 MG PO TABS
25.0000 mg | ORAL_TABLET | Freq: Once | ORAL | Status: AC
  Administered 2016-02-11: 25 mg via ORAL
  Filled 2016-02-11: qty 1

## 2016-02-11 MED ORDER — PREDNISONE 20 MG PO TABS: ORAL_TABLET | ORAL | Status: DC

## 2016-02-11 MED ORDER — FAMOTIDINE 20 MG PO TABS: 20.0000 mg | ORAL_TABLET | Freq: Two times a day (BID) | ORAL | Status: DC

## 2016-02-11 NOTE — Discharge Instructions (Signed)
Avoid getting hot, heat will make your rash get more prominent and make it itch more. Take the medications as prescribed until gone. You can take Benadryl 50 mg over-the-counter every 4-6 hours as needed for itching or rash. It will make you sleepy so do not drive a vehicle. Try to think of something you may been exposed to that could have caused a rash. It can take a week for the rash to totally go away. Return to the emergency department if he had trouble swallowing or breathing. Follow-up at [facility] to try to establish a local doctor. You can also follow-up with Dr. Gerilyn Pilgrimoonquah, [place] in [place]. Call his office at [phone], his [address] address is [address] [place].

## 2016-02-11 NOTE — ED Notes (Signed)
IV from left ac removed

## 2016-02-11 NOTE — ED Notes (Signed)
Pt brought in by [facility] for c/o rash and itching all over body; pt was given 50mg  benadryl and 125 mg solumedrol and albuterol en route to ED.
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
## 2016-02-11 NOTE — ED Notes (Signed)
Pt made aware to return if symptoms worsen or if any life threatening symptoms occur.   

## 2016-05-09 DIAGNOSIS — F319 Bipolar disorder, unspecified: Secondary | ICD-10-CM | POA: Insufficient documentation

## 2016-09-18 ENCOUNTER — Encounter (HOSPITAL_COMMUNITY): Payer: Self-pay | Admitting: *Deleted

## 2016-09-18 ENCOUNTER — Emergency Department (HOSPITAL_COMMUNITY): Payer: Medicaid Other

## 2016-09-18 ENCOUNTER — Emergency Department (HOSPITAL_COMMUNITY)
Admission: EM | Admit: 2016-09-18 | Discharge: 2016-09-18 | Disposition: A | Discharge status: Home / Self Care | Payer: Medicaid Other | Attending: Emergency Medicine | Admitting: Emergency Medicine

## 2016-09-18 DIAGNOSIS — J45909 Unspecified asthma, uncomplicated: Secondary | ICD-10-CM | POA: Diagnosis not present

## 2016-09-18 DIAGNOSIS — M549 Dorsalgia, unspecified: Secondary | ICD-10-CM | POA: Insufficient documentation

## 2016-09-18 DIAGNOSIS — F17210 Nicotine dependence, cigarettes, uncomplicated: Secondary | ICD-10-CM | POA: Diagnosis not present

## 2016-09-18 DIAGNOSIS — R300 Dysuria: Secondary | ICD-10-CM | POA: Insufficient documentation

## 2016-09-18 DIAGNOSIS — Z79899 Other long term (current) drug therapy: Secondary | ICD-10-CM | POA: Diagnosis not present

## 2016-09-18 DIAGNOSIS — R1032 Left lower quadrant pain: Secondary | ICD-10-CM | POA: Insufficient documentation

## 2016-09-18 DIAGNOSIS — F909 Attention-deficit hyperactivity disorder, unspecified type: Secondary | ICD-10-CM | POA: Diagnosis not present

## 2016-09-18 LAB — CBC WITH DIFFERENTIAL/PLATELET
Basophils Absolute: 0 10*3/uL (ref 0.0–0.1)
Basophils Relative: 0 %
Eosinophils Absolute: 0.2 10*3/uL (ref 0.0–0.7)
Eosinophils Relative: 2 %
HCT: 42.8 % (ref 36.0–46.0)
Hemoglobin: 14.4 g/dL (ref 12.0–15.0)
Lymphocytes Relative: 26 %
Lymphs Abs: 3 10*3/uL (ref 0.7–4.0)
MCH: 31.2 pg (ref 26.0–34.0)
MCHC: 33.6 g/dL (ref 30.0–36.0)
MCV: 92.8 fL (ref 78.0–100.0)
Monocytes Absolute: 0.9 10*3/uL (ref 0.1–1.0)
Monocytes Relative: 8 %
Neutro Abs: 7.4 10*3/uL (ref 1.7–7.7)
Neutrophils Relative %: 64 %
Platelets: 226 10*3/uL (ref 150–400)
RBC: 4.61 MIL/uL (ref 3.87–5.11)
RDW: 12.9 % (ref 11.5–15.5)
WBC: 11.6 10*3/uL — ABNORMAL HIGH (ref 4.0–10.5)

## 2016-09-18 LAB — URINALYSIS, ROUTINE W REFLEX MICROSCOPIC
Bilirubin Urine: NEGATIVE
Glucose, UA: NEGATIVE mg/dL
Hgb urine dipstick: NEGATIVE
Ketones, ur: NEGATIVE mg/dL
Leukocytes, UA: NEGATIVE
Nitrite: NEGATIVE
Protein, ur: NEGATIVE mg/dL
Specific Gravity, Urine: 1.015 (ref 1.005–1.030)
pH: 6.5 (ref 5.0–8.0)

## 2016-09-18 LAB — I-STAT BETA HCG BLOOD, ED (MC, WL, AP ONLY): I-stat hCG, quantitative: <5 m[IU]/mL (ref <5)

## 2016-09-18 LAB — BASIC METABOLIC PANEL
Anion gap: 4 — ABNORMAL LOW (ref 5–15)
BUN: 7 mg/dL (ref 6–20)
CO2: 26 mmol/L (ref 22–32)
Calcium: 9.2 mg/dL (ref 8.9–10.3)
Chloride: 107 mmol/L (ref 101–111)
Creatinine, Ser: 0.73 mg/dL (ref 0.44–1.00)
GFR calc Af Amer: >60 mL/min (ref >60)
GFR calc non Af Amer: >60 mL/min (ref >60)
Glucose, Bld: 82 mg/dL (ref 65–99)
Potassium: 3.4 mmol/L — ABNORMAL LOW (ref 3.5–5.1)
Sodium: 137 mmol/L (ref 135–145)

## 2016-09-18 LAB — POC URINE PREG, ED: Preg Test, Ur: NEGATIVE

## 2016-09-18 IMAGING — CT CT RENAL STONE PROTOCOL
2 of 4 series · 15 of 46 positions shown, 17 images · non-contrast
Comparison: One-view abdomen [DATE]. CT of the abdomen and
pelvis [DATE].

CLINICAL DATA: Left lower quadrant pain beginning today. Pain
extends into the low back as well. Personal history of kidney
stones.

EXAM:
CT ABDOMEN AND PELVIS WITHOUT CONTRAST
TECHNIQUE: Multidetector CT imaging of the abdomen and pelvis was performed
following the standard protocol without IV contrast.

[Series 2: axial st · axial · 0.75mm/px · z∈[-600,-160]mm · 12 of 106 slices shown, 14 images]
[im 9/106  soft-tissue]
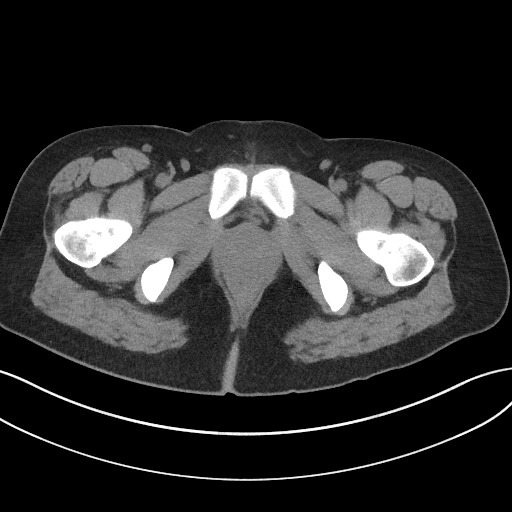
[im 9/106  bone]
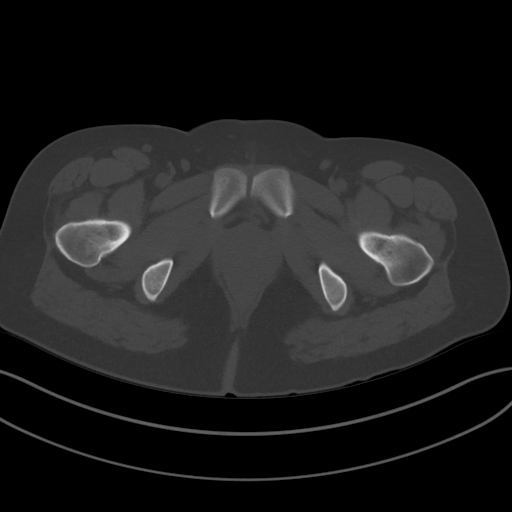
[im 17/106  soft-tissue]
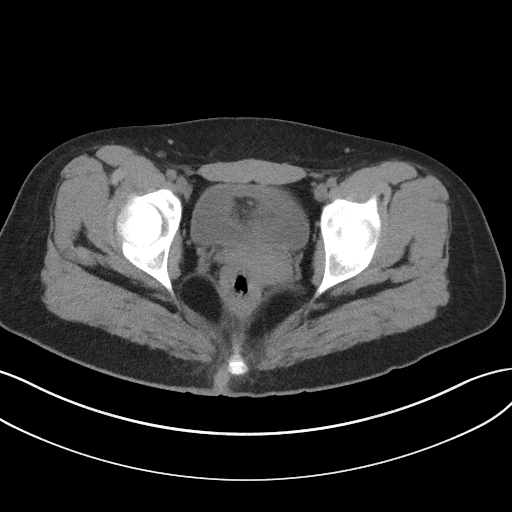
[im 25/106  soft-tissue]
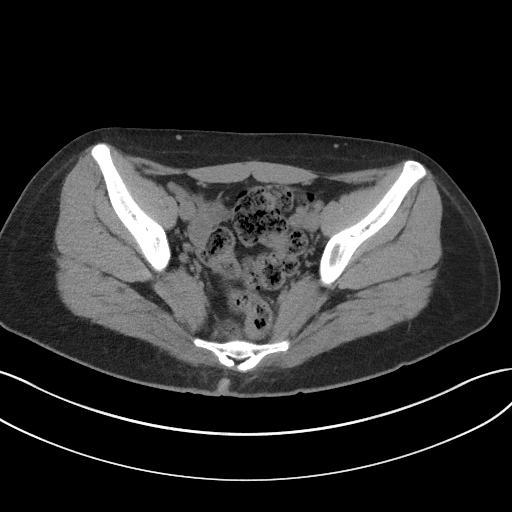
[im 33/106  soft-tissue]
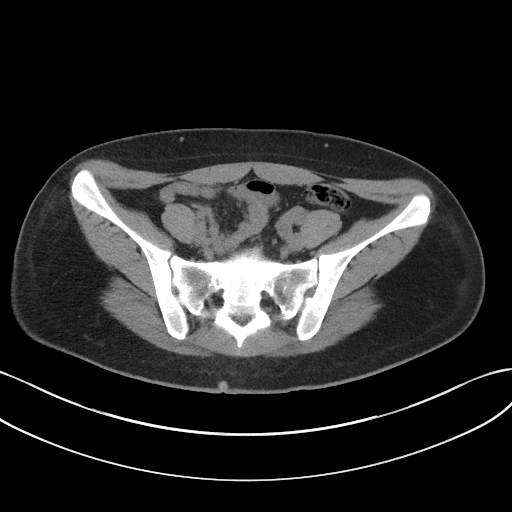
[im 41/106  soft-tissue]
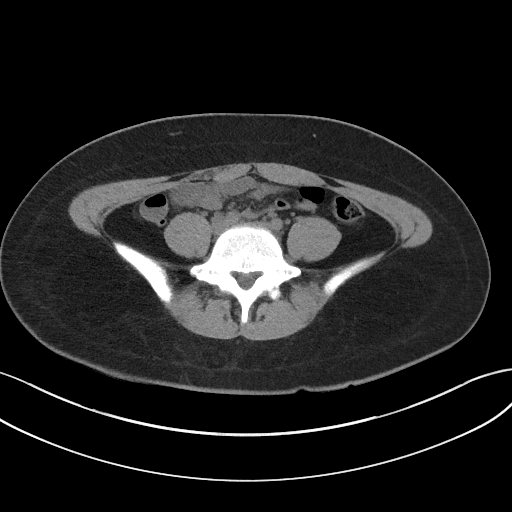
[im 49/106  soft-tissue]
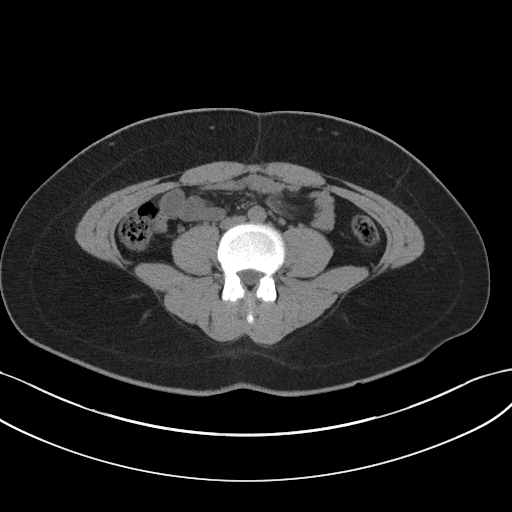
[im 57/106  soft-tissue]
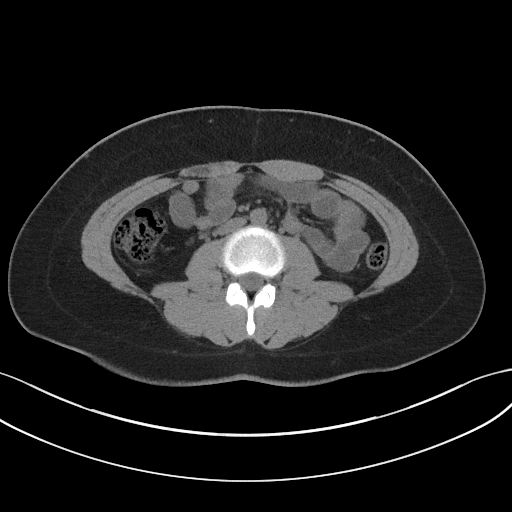
[im 65/106  soft-tissue]
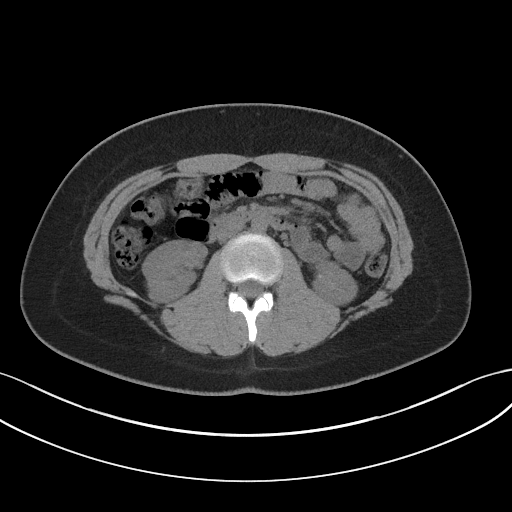
[im 73/106  soft-tissue]
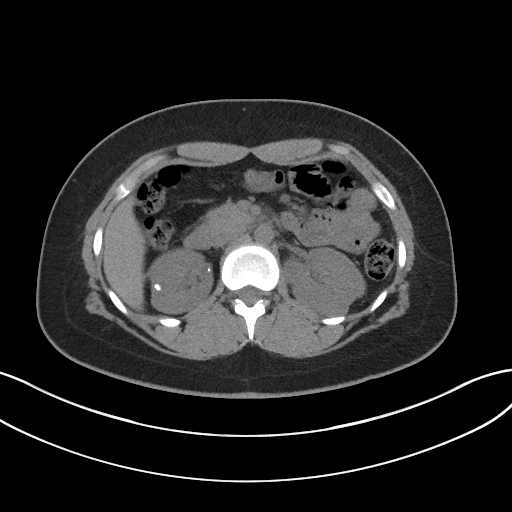
[im 73/106  bone]
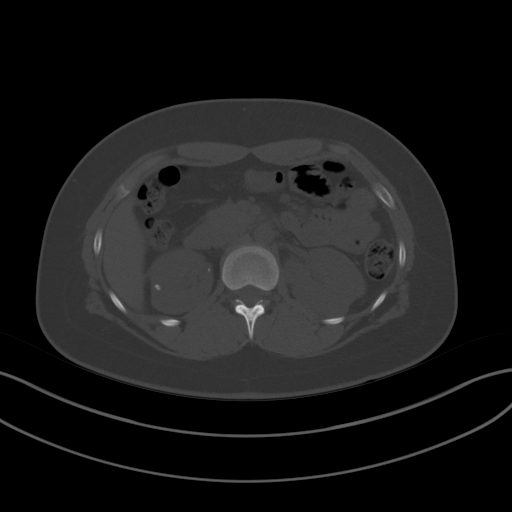
[im 81/106  soft-tissue]
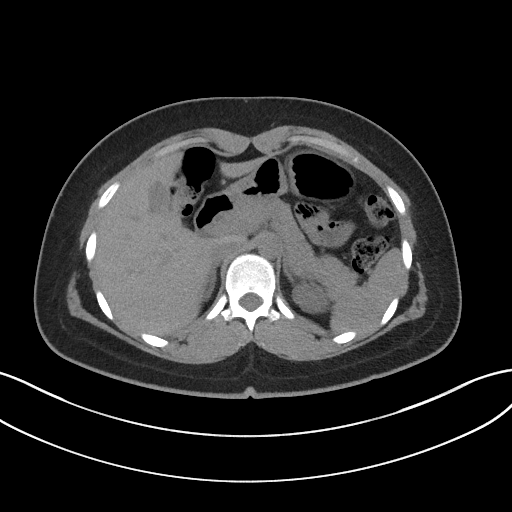
[im 89/106  soft-tissue]
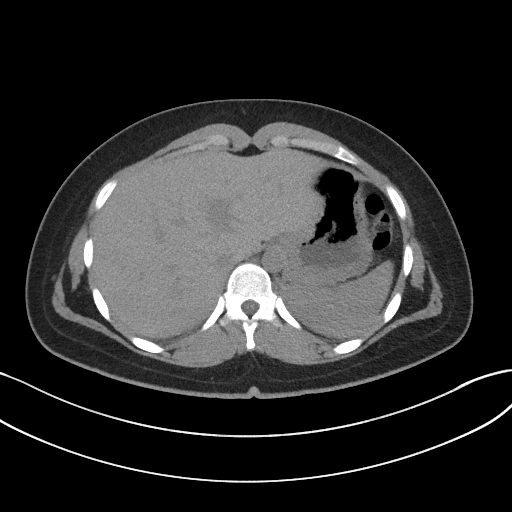
[im 97/106  soft-tissue]
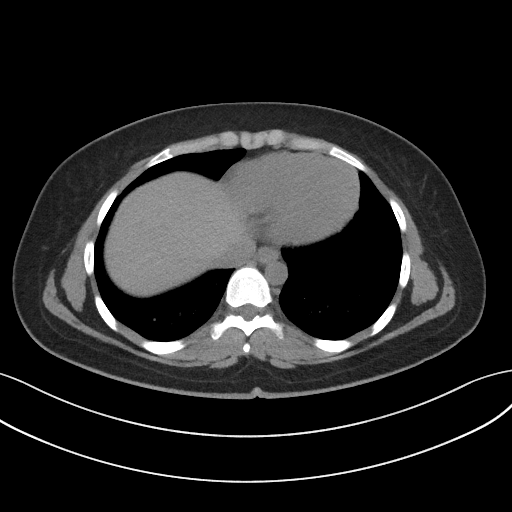

[Series 3: coronal st · coronal · 0.79mm/px · 3 of 95 slices shown]
[im 32/95  soft-tissue]
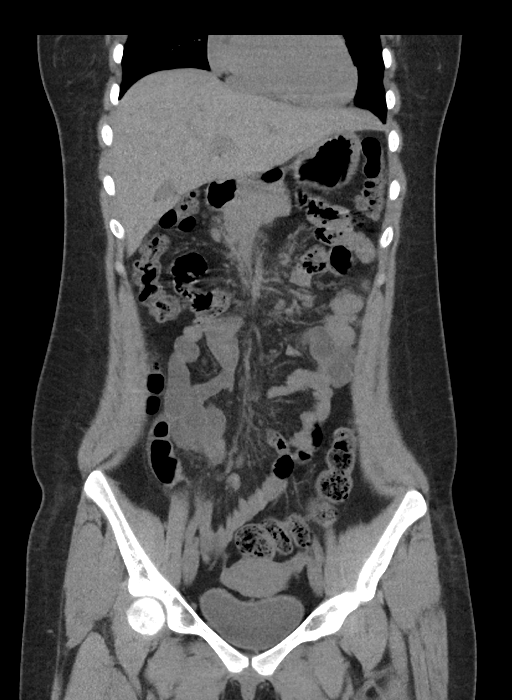
[im 42/95  soft-tissue]
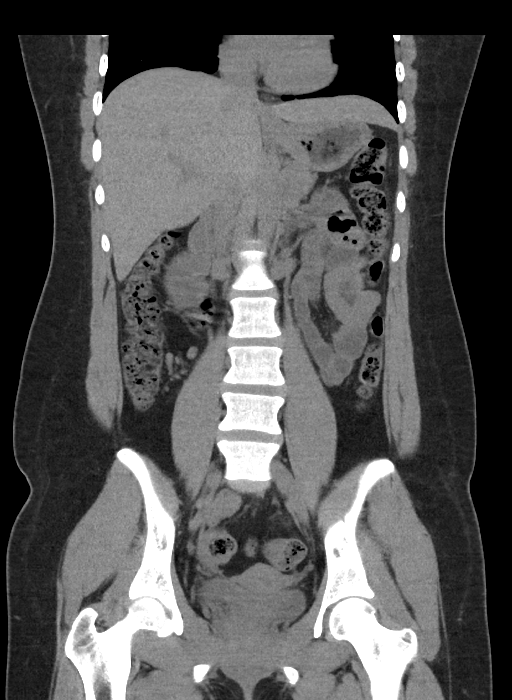
[im 53/95  soft-tissue]
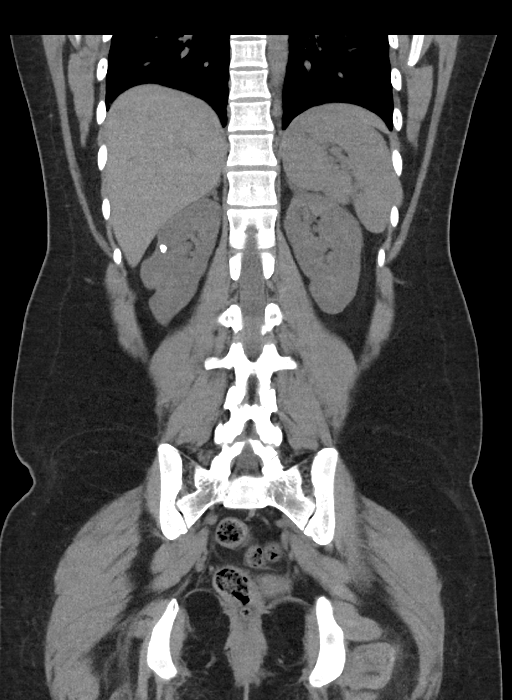

[15 of 46 positions shown; findings below may reference images not displayed]

FINDINGS: Lower chest: Minimal dependent atelectasis is present at the lung
bases bilaterally. There is no significant airspace consolidation.
No nodule or mass lesion is present. The heart size is normal. There
is no significant pleural or pericardial effusion.

Hepatobiliary: No focal liver abnormality is seen. No gallstones,
gallbladder wall thickening, or biliary dilatation.

Pancreas: No significant inflammatory changes are present. There is
no significant solid or cystic mass lesion. No significant ductal
dilation is present.

Spleen: Choose 1

Adrenals/Urinary Tract: The adrenal glands are normal bilaterally. A
punctate medial stone in the right kidney is stable. A posterior 6
mm stone with scarring has slightly increased in size in the right
kidney. A punctate nonobstructing stone is present at the lower pole
of the left kidney. There is some scarring posteriorly in the left
kidney without significant interval change. Ureters are within
normal limits. There is no hydronephrosis. The urinary bladder is
normal as well.

Stomach/Bowel: Stomach and duodenum are within normal limits. The
small bowel is unremarkable. The appendix is visualized and normal.
The ascending transverse colon are within normal limits. The
descending and sigmoid colon are unremarkable. The rectum is within
normal limits.

Vascular/Lymphatic: Choose 1

Reproductive: Choose 1

Other: No significant free fluid or free air is present.

Musculoskeletal: Bone windows are unremarkable.
IMPRESSION: 1. Slight increase in size of nonobstructing right-sided kidney
stones.
2. New punctate nonobstructing stone at the lower pole of the left
kidney.
3. No urinary tract obstruction.
4. No acute or focal lesion to explain the patient's pain.

## 2016-09-18 MED ORDER — SODIUM CHLORIDE 0.9 % IV SOLN: INTRAVENOUS | Status: DC

## 2016-09-18 MED ORDER — ONDANSETRON HCL 4 MG/2ML IJ SOLN
4.0000 mg | Freq: Once | INTRAMUSCULAR | Status: AC
  Administered 2016-09-18: 4 mg via INTRAVENOUS
  Filled 2016-09-18: qty 2

## 2016-09-18 MED ORDER — NAPROXEN 500 MG PO TABS: 500.0000 mg | ORAL_TABLET | Freq: Two times a day (BID) | ORAL | 1 refills | Status: DC

## 2016-09-18 MED ORDER — SODIUM CHLORIDE 0.9 % IV BOLUS (SEPSIS)
1000.0000 mL | Freq: Once | INTRAVENOUS | Status: AC
  Administered 2016-09-18: 1000 mL via INTRAVENOUS

## 2016-09-18 MED ORDER — ONDANSETRON HCL 4 MG/2ML IJ SOLN
INTRAMUSCULAR | Status: AC
  Filled 2016-09-18: qty 2

## 2016-09-18 MED ORDER — HYDROMORPHONE HCL 1 MG/ML IJ SOLN
1.0000 mg | Freq: Once | INTRAMUSCULAR | Status: AC
  Administered 2016-09-18: 1 mg via INTRAVENOUS
  Filled 2016-09-18: qty 1

## 2016-09-18 MED ORDER — HYDROCODONE-ACETAMINOPHEN 5-325 MG PO TABS: 1.0000 | ORAL_TABLET | Freq: Four times a day (QID) | ORAL | 0 refills | Status: DC | PRN

## 2016-09-18 NOTE — ED Triage Notes (Signed)
Pt comes in with LLQ pain starting this morning. Pain moves into lower back as well. Has hx of kidney stones. Pt has had n/v.

## 2016-09-18 NOTE — Discharge Instructions (Signed)
Take the Naprosyn on a regular basis. Supplement with the hydrocodone as needed for additional pain control. Return for any new or worse symptoms. Today's workup without specific findings. Symptoms could be musculoskeletal in nature.

## 2016-09-18 NOTE — ED Notes (Addendum)
Pt states that she is 2 months late for her period. States her last period was the middle of august. Pt states she was having sex without protection with her fiance because they were trying to get pregnant. She states "it just now occurred to me that I could be pregnant."

## 2016-09-18 NOTE — ED Provider Notes (Signed)
[facility]-EM[facility]ENCY DEPT Provider Note   CSN: [id number] Arrival date & time: 09/18/16  1357     History   Chief Complaint Chief Complaint  Patient presents with  . Abdominal Pain    HPI Erin Krueger is a 20 y.o. female.  Patient with onset of left lower quadrant abdominal pain at 9 this morning. Patient stated it feels like when she's had passed kidney stones. Associated with nausea but no vomiting no diarrhea. Some dysuria but no hematuria. Last kidney stone reported to be 3 months ago.    Patient states the pain is 8 out of 10 made worse by moving.  Past Medical History:  Diagnosis Date  . ADHD (attention deficit hyperactivity disorder)   . Allergy   . Anxiety   . Asthma   . Eating disorder   . Headache(784.0)   . ODD (oppositional defiant disorder)   . PTSD (post-traumatic stress disorder)     Patient Active Problem List   Diagnosis Date Noted  . PID (acute pelvic inflammatory disease) 01/29/2016  . Seizure disorder (HCC) 01/07/2016  . Seizure (HCC) 01/07/2016  . 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 PTSD (post-traumatic stress disorder) 05/12/2013  . 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 Polysubstance abuse 05/12/2013    Past Surgical History:  Procedure Laterality Date  . TONSILLECTOMY      OB History    No data available       Home Medications    Prior to Admission medications   Medication Sig Start Date End Date Taking? Authorizing Provider  acetaminophen (TYLENOL) 500 MG tablet Take 1 tablet (500 mg total) by mouth every 6 (six) hours as needed for mild pain. 01/09/16  Yes Standley Brooking, MD  beclomethasone (QVAR) 80 MCG/ACT inhaler Inhale 1 puff into the lungs 2 (two) times daily. Patient taking differently: Inhale 2 puffs into the lungs 2 (two) times daily.  10/11/14  Yes Gwyneth Sprout, MD  levETIRAcetam (KEPPRA) 500 MG tablet Take 1 tablet (500 mg total) by mouth 2 (two) times daily.  01/06/16  Yes Kristen N Ward, DO  albuterol (PROVENTIL HFA;VENTOLIN HFA) 108 (90 Base) MCG/ACT inhaler Inhale 2 puffs into the lungs every 6 (six) hours as needed for wheezing. Patient may resume home supply. Patient not taking: Reported on 09/18/2016 01/09/16   Standley Brooking, MD  EPINEPHrine 0.3 mg/0.3 mL IJ SOAJ injection Inject 0.3 mg into the muscle as needed (Bee Stings).    Historical Provider, MD  HYDROcodone-acetaminophen (NORCO/VICODIN) 5-325 MG tablet Take 1-2 tablets by mouth every 6 (six) hours as needed. 09/18/16   Vanetta Mulders, MD  naproxen (NAPROSYN) 500 MG tablet Take 1 tablet (500 mg total) by mouth 2 (two) times daily. 09/18/16   Vanetta Mulders, MD  norgestimate-ethinyl estradiol (ORTHO-CYCLEN,SPRINTEC,PREVIFEM) 0.25-35 MG-MCG tablet Take 1 tablet by mouth daily. Patient not taking: Reported on 09/18/2016 01/31/16   Tilda Burrow, MD    Family History No family history on file.  Social History Social History  Substance Use Topics  . Smoking status: Current Every Day Smoker    Packs/day: 1.00    Types: Cigarettes    Last attempt to quit: 07/21/2013  . Smokeless tobacco: Never Used  . Alcohol use Yes     Comment: 3-4 glassed vodka/beer     Allergies   Bee venom; Prednisone; and Latex   Review of Systems Review of Systems  Constitutional: Negative for fever.  HENT: Negative for congestion.   Eyes: Negative for  redness.  Respiratory: Negative for shortness of breath.   Cardiovascular: Negative for chest pain.  Gastrointestinal: Positive for abdominal pain.  Genitourinary: Positive for dysuria and flank pain.  Musculoskeletal: Positive for back pain.  Skin: Negative for rash.  Neurological: Negative for headaches.  Hematological: Does not bruise/bleed easily.  Psychiatric/Behavioral: Negative for confusion.     Physical Exam Updated Vital Signs BP 118/80 (BP Location: Left Arm)   Pulse 64   Temp 98.3 F (36.8 C) (Oral)   Resp 18   Ht 5\' 7"   (1.702 m)   Wt 81.6 kg   LMP 07/12/2016 Comment: neg u preg  SpO2 99%   BMI 28.19 kg/m   Physical Exam  Constitutional: She is oriented to person, place, and time. She appears well-developed and well-nourished. No distress.  HENT:  Head: Normocephalic and atraumatic.  Mouth/Throat: Oropharynx is clear and moist.  Eyes: Conjunctivae and EOM are normal. Pupils are equal, round, and reactive to light.  Neck: Normal range of motion. Neck supple.  Cardiovascular: Normal rate, regular rhythm and normal heart sounds.   Pulmonary/Chest: Effort normal and breath sounds normal.  Abdominal: Soft. Bowel sounds are normal. There is no tenderness.  Musculoskeletal: Normal range of motion.  Neurological: She is alert and oriented to person, place, and time. No cranial nerve deficit. She exhibits normal muscle tone. Coordination normal.  Skin: Skin is warm. No rash noted.  Nursing note and vitals reviewed.    ED Treatments / Results  Labs (all labs ordered are listed, but only abnormal results are displayed) Labs Reviewed  CBC WITH DIFFERENTIAL/PLATELET - Abnormal; Notable for the following:       Result Value   WBC 11.6 (*)    All other components within normal limits  BASIC METABOLIC PANEL - Abnormal; Notable for the following:    Potassium 3.4 (*)    Anion gap 4 (*)    All other components within normal limits  URINALYSIS, ROUTINE W REFLEX MICROSCOPIC (NOT AT [facility])  I-STAT BETA HCG BLOOD, ED ([facility], [facility], [facility] ONLY)  POC URINE PREG, ED   Results for orders placed or performed during the hospital encounter of 09/18/16  CBC with Differential/Platelet  Result Value Ref Range   WBC 11.6 (H) 4.0 - 10.5 K/uL   RBC 4.61 3.87 - 5.11 MIL/uL   Hemoglobin 14.4 12.0 - 15.0 g/dL   HCT 84.1 32.4 - 40.1 %   MCV 92.8 78.0 - 100.0 fL   MCH 31.2 26.0 - 34.0 pg   MCHC 33.6 30.0 - 36.0 g/dL   RDW 02.7 25.3 - 66.4 %   Platelets 226 150 - 400 K/uL   Neutrophils Relative % 64 %   Neutro Abs 7.4 1.7 - 7.7  K/uL   Lymphocytes Relative 26 %   Lymphs Abs 3.0 0.7 - 4.0 K/uL   Monocytes Relative 8 %   Monocytes Absolute 0.9 0.1 - 1.0 K/uL   Eosinophils Relative 2 %   Eosinophils Absolute 0.2 0.0 - 0.7 K/uL   Basophils Relative 0 %   Basophils Absolute 0.0 0.0 - 0.1 K/uL  Basic metabolic panel  Result Value Ref Range   Sodium 137 135 - 145 mmol/L   Potassium 3.4 (L) 3.5 - 5.1 mmol/L   Chloride 107 101 - 111 mmol/L   CO2 26 22 - 32 mmol/L   Glucose, Bld 82 65 - 99 mg/dL   BUN 7 6 - 20 mg/dL   Creatinine, Ser 4.03 0.44 - 1.00 mg/dL  Calcium 9.2 8.9 - 10.3 mg/dL   GFR calc non Af Amer >60 >60 mL/min   GFR calc Af Amer >60 >60 mL/min   Anion gap 4 (L) 5 - 15  Urinalysis, Routine w reflex microscopic (not at [facility])  Result Value Ref Range   Color, Urine YELLOW YELLOW   APPearance CLEAR CLEAR   Specific Gravity, Urine 1.015 1.005 - 1.030   pH 6.5 5.0 - 8.0   Glucose, UA NEGATIVE NEGATIVE mg/dL   Hgb urine dipstick NEGATIVE NEGATIVE   Bilirubin Urine NEGATIVE NEGATIVE   Ketones, ur NEGATIVE NEGATIVE mg/dL   Protein, ur NEGATIVE NEGATIVE mg/dL   Nitrite NEGATIVE NEGATIVE   Leukocytes, UA NEGATIVE NEGATIVE  I-Stat beta hCG blood, ED  Result Value Ref Range   I-stat hCG, quantitative <5.0 <5 mIU/mL   Comment 3          POC urine preg, ED (not at [facility])  Result Value Ref Range   Preg Test, Ur NEGATIVE NEGATIVE     EKG  EKG Interpretation None       Radiology Ct Renal Stone Study  Result Date: 09/18/2016 CLINICAL DATA:  Left lower quadrant pain beginning today. Pain extends into the low back as well. Personal history of kidney stones. EXAM: CT ABDOMEN AND PELVIS WITHOUT CONTRAST TECHNIQUE: Multidetector CT imaging of the abdomen and pelvis was performed following the standard protocol without IV contrast. COMPARISON:  One-view abdomen 07/02/2016. CT of the abdomen and pelvis 01/29/2016. FINDINGS: Lower chest: Minimal dependent atelectasis is present at the lung bases bilaterally.  There is no significant airspace consolidation. No nodule or mass lesion is present. The heart size is normal. There is no significant pleural or pericardial effusion. Hepatobiliary: No focal liver abnormality is seen. No gallstones, gallbladder wall thickening, or biliary dilatation. Pancreas: No significant inflammatory changes are present. There is no significant solid or cystic mass lesion. No significant ductal dilation is present. Spleen: Choose 1 Adrenals/Urinary Tract: The adrenal glands are normal bilaterally. A punctate medial stone in the right kidney is stable. A posterior 6 mm stone with scarring has slightly increased in size in the right kidney. A punctate nonobstructing stone is present at the lower pole of the left kidney. There is some scarring posteriorly in the left kidney without significant interval change. Ureters are within normal limits. There is no hydronephrosis. The urinary bladder is normal as well. Stomach/Bowel: Stomach and duodenum are within normal limits. The small bowel is unremarkable. The appendix is visualized and normal. The ascending transverse colon are within normal limits. The descending and sigmoid colon are unremarkable. The rectum is within normal limits. Vascular/Lymphatic: Choose 1 Reproductive: Choose 1 Other: No significant free fluid or free air is present. Musculoskeletal: Bone windows are unremarkable. IMPRESSION: 1. Slight increase in size of nonobstructing right-sided kidney stones. 2. New punctate nonobstructing stone at the lower pole of the left kidney. 3. No urinary tract obstruction. 4. No acute or focal lesion to explain the patient's pain. Electronically Signed   By: Marin Roberts M.D.   On: 09/18/2016 16:36    Procedures Procedures (including critical care time)  Medications Ordered in ED Medications  0.9 %  sodium chloride infusion (not administered)  sodium chloride 0.9 % bolus 1,000 mL (1,000 mLs Intravenous New Bag/Given 09/18/16  1612)  ondansetron (ZOFRAN) injection 4 mg (4 mg Intravenous Given 09/18/16 1615)  HYDROmorphone (DILAUDID) injection 1 mg (1 mg Intravenous Given 09/18/16 1613)  HYDROmorphone (DILAUDID) injection 1 mg (1 mg Intravenous Given  09/18/16 1715)     Initial Impression / Assessment and Plan / ED Course  I have reviewed the triage vital signs and the nursing notes.  Pertinent labs & imaging results that were available during my care of the patient were reviewed by me and considered in my medical decision making (see chart for details).  Clinical Course    Patient with pain left lower quadrant but was worse with movement suggested that it may be musculoskeletal. Patient thought it was related to a kidney stone. CT renal study without evidence of any ureteral stones. Also no evidence of any other abnormalities. Will treat symptomatically sickly musculoskeletal. Always still possible there could be an ovarian cyst but no comments made about any abnormalities there. Patient nontoxic no acute distress. Labs without significant abnormalities.  Final Clinical Impressions(s) / ED Diagnoses   Final diagnoses:  Left lower quadrant pain    New Prescriptions New Prescriptions   HYDROCODONE-ACETAMINOPHEN (NORCO/VICODIN) 5-325 MG TABLET    Take 1-2 tablets by mouth every 6 (six) hours as needed.   NAPROXEN (NAPROSYN) 500 MG TABLET    Take 1 tablet (500 mg total) by mouth 2 (two) times daily.     Vanetta MuldersScott Haleem Hanner, MD 09/18/16 [phone]

## 2016-11-17 ENCOUNTER — Encounter (HOSPITAL_COMMUNITY): Payer: Self-pay | Admitting: Emergency Medicine

## 2016-11-17 ENCOUNTER — Inpatient Hospital Stay (HOSPITAL_COMMUNITY)
Admission: EM | Admit: 2016-11-17 | Discharge: 2016-11-20 | DRG: 781 | Disposition: A | Discharge status: Home / Self Care | Payer: Medicaid Other | Attending: Internal Medicine | Admitting: Internal Medicine

## 2016-11-17 DIAGNOSIS — Z91040 Latex allergy status: Secondary | ICD-10-CM | POA: Diagnosis not present

## 2016-11-17 DIAGNOSIS — F912 Conduct disorder, adolescent-onset type: Secondary | ICD-10-CM | POA: Diagnosis present

## 2016-11-17 DIAGNOSIS — O99341 Other mental disorders complicating pregnancy, first trimester: Secondary | ICD-10-CM | POA: Diagnosis present

## 2016-11-17 DIAGNOSIS — F17210 Nicotine dependence, cigarettes, uncomplicated: Secondary | ICD-10-CM | POA: Diagnosis present

## 2016-11-17 DIAGNOSIS — F4310 Post-traumatic stress disorder, unspecified: Secondary | ICD-10-CM | POA: Diagnosis present

## 2016-11-17 DIAGNOSIS — Z3A01 Less than 8 weeks gestation of pregnancy: Secondary | ICD-10-CM

## 2016-11-17 DIAGNOSIS — Z91048 Other nonmedicinal substance allergy status: Secondary | ICD-10-CM

## 2016-11-17 DIAGNOSIS — Z79899 Other long term (current) drug therapy: Secondary | ICD-10-CM | POA: Diagnosis not present

## 2016-11-17 DIAGNOSIS — O99351 Diseases of the nervous system complicating pregnancy, first trimester: Principal | ICD-10-CM | POA: Diagnosis present

## 2016-11-17 DIAGNOSIS — Z91030 Bee allergy status: Secondary | ICD-10-CM | POA: Diagnosis not present

## 2016-11-17 DIAGNOSIS — R569 Unspecified convulsions: Secondary | ICD-10-CM | POA: Diagnosis present

## 2016-11-17 DIAGNOSIS — F1910 Other psychoactive substance abuse, uncomplicated: Secondary | ICD-10-CM | POA: Diagnosis present

## 2016-11-17 DIAGNOSIS — F902 Attention-deficit hyperactivity disorder, combined type: Secondary | ICD-10-CM | POA: Diagnosis present

## 2016-11-17 DIAGNOSIS — G40901 Epilepsy, unspecified, not intractable, with status epilepticus: Secondary | ICD-10-CM | POA: Diagnosis present

## 2016-11-17 DIAGNOSIS — Z888 Allergy status to other drugs, medicaments and biological substances status: Secondary | ICD-10-CM | POA: Diagnosis not present

## 2016-11-17 DIAGNOSIS — F419 Anxiety disorder, unspecified: Secondary | ICD-10-CM | POA: Diagnosis present

## 2016-11-17 DIAGNOSIS — F445 Conversion disorder with seizures or convulsions: Secondary | ICD-10-CM | POA: Diagnosis not present

## 2016-11-17 DIAGNOSIS — Z820 Family history of epilepsy and other diseases of the nervous system: Secondary | ICD-10-CM

## 2016-11-17 DIAGNOSIS — Z915 Personal history of self-harm: Secondary | ICD-10-CM

## 2016-11-17 DIAGNOSIS — O99331 Smoking (tobacco) complicating pregnancy, first trimester: Secondary | ICD-10-CM | POA: Diagnosis present

## 2016-11-17 DIAGNOSIS — Z3490 Encounter for supervision of normal pregnancy, unspecified, unspecified trimester: Secondary | ICD-10-CM

## 2016-11-17 HISTORY — DX: Unspecified convulsions: R56.9

## 2016-11-17 LAB — CBC WITH DIFFERENTIAL/PLATELET
Basophils Absolute: 0 10*3/uL (ref 0.0–0.1)
Basophils Relative: 0 %
Eosinophils Absolute: 0.2 10*3/uL (ref 0.0–0.7)
Eosinophils Relative: 1 %
HCT: 40.7 % (ref 36.0–46.0)
Hemoglobin: 13.9 g/dL (ref 12.0–15.0)
Lymphocytes Relative: 26 %
Lymphs Abs: 3.7 10*3/uL (ref 0.7–4.0)
MCH: 31.3 pg (ref 26.0–34.0)
MCHC: 34.2 g/dL (ref 30.0–36.0)
MCV: 91.7 fL (ref 78.0–100.0)
Monocytes Absolute: 1.2 10*3/uL — ABNORMAL HIGH (ref 0.1–1.0)
Monocytes Relative: 8 %
Neutro Abs: 9.1 10*3/uL — ABNORMAL HIGH (ref 1.7–7.7)
Neutrophils Relative %: 65 %
Platelets: 226 10*3/uL (ref 150–400)
RBC: 4.44 MIL/uL (ref 3.87–5.11)
RDW: 11.8 % (ref 11.5–15.5)
WBC: 14.2 10*3/uL — ABNORMAL HIGH (ref 4.0–10.5)

## 2016-11-17 LAB — BASIC METABOLIC PANEL
Anion gap: 7 (ref 5–15)
BUN: 10 mg/dL (ref 6–20)
CO2: 21 mmol/L — ABNORMAL LOW (ref 22–32)
Calcium: 9 mg/dL (ref 8.9–10.3)
Chloride: 107 mmol/L (ref 101–111)
Creatinine, Ser: 0.61 mg/dL (ref 0.44–1.00)
GFR calc Af Amer: >60 mL/min (ref >60)
GFR calc non Af Amer: >60 mL/min (ref >60)
Glucose, Bld: 92 mg/dL (ref 65–99)
Potassium: 3.5 mmol/L (ref 3.5–5.1)
Sodium: 135 mmol/L (ref 135–145)

## 2016-11-17 LAB — PHOSPHORUS: Phosphorus: 3.7 mg/dL (ref 2.5–4.6)

## 2016-11-17 LAB — MAGNESIUM: Magnesium: 1.9 mg/dL (ref 1.7–2.4)

## 2016-11-17 LAB — HEPATIC FUNCTION PANEL
ALT: 12 U/L — ABNORMAL LOW (ref 14–54)
AST: 13 U/L — ABNORMAL LOW (ref 15–41)
Albumin: 4 g/dL (ref 3.5–5.0)
Alkaline Phosphatase: 56 U/L (ref 38–126)
Bilirubin, Direct: <0.1 mg/dL — ABNORMAL LOW (ref 0.1–0.5)
Total Bilirubin: 0.4 mg/dL (ref 0.3–1.2)
Total Protein: 6.7 g/dL (ref 6.5–8.1)

## 2016-11-17 LAB — HCG, QUANTITATIVE, PREGNANCY: hCG, Beta Chain, Quant, S: 64157 m[IU]/mL — ABNORMAL HIGH (ref <5)

## 2016-11-17 MED ORDER — LORAZEPAM 2 MG/ML IJ SOLN
INTRAMUSCULAR | Status: AC
  Filled 2016-11-17: qty 1

## 2016-11-17 MED ORDER — POTASSIUM CHLORIDE IN NACL 20-0.9 MEQ/L-% IV SOLN
INTRAVENOUS | Status: DC
  Administered 2016-11-18 (×2): via INTRAVENOUS

## 2016-11-17 MED ORDER — LORAZEPAM 2 MG/ML IJ SOLN
2.0000 mg | INTRAMUSCULAR | Status: DC | PRN
  Administered 2016-11-18: 2 mg via INTRAVENOUS

## 2016-11-17 MED ORDER — LORAZEPAM 2 MG/ML IJ SOLN
1.0000 mg | Freq: Once | INTRAMUSCULAR | Status: AC
  Administered 2016-11-17: 1 mg via INTRAVENOUS
  Filled 2016-11-17: qty 1

## 2016-11-17 MED ORDER — LORAZEPAM 2 MG/ML IJ SOLN: 2.0000 mg | Freq: Once | INTRAMUSCULAR | Status: DC

## 2016-11-17 MED ORDER — SODIUM CHLORIDE 0.9 % IV BOLUS (SEPSIS)
1000.0000 mL | Freq: Once | INTRAVENOUS | Status: AC
  Administered 2016-11-17: 1000 mL via INTRAVENOUS

## 2016-11-17 MED ORDER — ENOXAPARIN SODIUM 40 MG/0.4ML ~~LOC~~ SOLN: 40.0000 mg | SUBCUTANEOUS | Status: DC

## 2016-11-17 MED ORDER — LORAZEPAM 2 MG/ML IJ SOLN
2.0000 mg | Freq: Once | INTRAMUSCULAR | Status: DC
  Filled 2016-11-17: qty 1

## 2016-11-17 MED ORDER — LORAZEPAM 2 MG/ML IJ SOLN
2.0000 mg | Freq: Once | INTRAMUSCULAR | Status: AC
  Administered 2016-11-17: 2 mg via INTRAVENOUS

## 2016-11-17 MED ORDER — LEVETIRACETAM IN NACL 1000 MG/100ML IV SOLN
1000.0000 mg | Freq: Once | INTRAVENOUS | Status: AC
  Administered 2016-11-17: 1000 mg via INTRAVENOUS
  Filled 2016-11-17: qty 100

## 2016-11-17 MED ORDER — MAGNESIUM SULFATE 2 GM/50ML IV SOLN
2.0000 g | Freq: Once | INTRAVENOUS | Status: AC
Start: 2016-11-17 — End: 2016-11-17
  Administered 2016-11-17: 2 g via INTRAVENOUS
  Filled 2016-11-17: qty 50

## 2016-11-17 NOTE — ED Notes (Signed)
Pt trying to stand up. RN instructed pt not to stand and explained the risks.

## 2016-11-17 NOTE — ED Notes (Signed)
Husband at bedside. Patient states that she wet herself when she had her seizure.Removed soiled clothing and linen. Patient stated that she needed to use bedpan, but was unable to void.

## 2016-11-17 NOTE — H&P (Signed)
History and Physical    Erin Krueger ZOX:[id number] DOB: 03/03/1996 DOA: 11/17/2016  PCP: No PCP Per Patient   Patient coming from: Home.  Chief Complaint: Seizures.  HPI: Erin Krueger is a 20 y.o. female with medical history significant of ADHD, allergy, anxiety, asthma, eating disorder, oppositional defined disorder, PTSD, headaches who was brought to the emergency department via EMS after having several seizures at home and during EMS transport.  Per patient, she recently became pregnant and was taken off Keppra by her OB doctor. Today at home, her fianc witnessed several seizures episodes, called EMS who gave her IV lorazepam and transported her to the emergency department. EMS reported to the nursing staff that they witnessed 7 seizures while the patient was in route to the hospital. Once in the ED, patient received more lorazepam for a total of 6 mg and she was loaded with 1000 mg of Keppra by Dr. Sela Hua after he discussed the patient with neurology.   However, the patient has apparently continued having seizures. Most recently, I witnessed the last few seizures, there is no desaturation, there is no postictal period and the patient has become very agitated after being informed, first that she was going to be transferred to [facility], because she did not want to go to [place]. Second, the patient was informed that her activity is more consistent with pseudoseizures, then generalized seizure episodes, which seems to have upset her more. I discussed the case with Dr. Noel Christmas and he feels there is no need for transfer at this time.  ED Course: As above mentioned.  Review of Systems: As per HPI otherwise 10 point review of systems negative.    Past Medical History:  Diagnosis Date  . ADHD (attention deficit hyperactivity disorder)   . Allergy   . Anxiety   . Asthma   . Eating disorder   . Headache(784.0)   . ODD (oppositional defiant disorder)   . PTSD  (post-traumatic stress disorder)     Past Surgical History:  Procedure Laterality Date  . TONSILLECTOMY       reports that she has been smoking Cigarettes.  She has been smoking about 1.00 pack per day. She has never used smokeless tobacco. She reports that she drinks alcohol. She reports that she uses drugs, including Marijuana and Cocaine.  Allergies  Allergen Reactions  . Bee Venom Anaphylaxis  . Cocoa Butter   . Prednisone Other (See Comments)    Bleeding nose bleeding and internal bleeding  . Latex Rash    Family History  Problem Relation Age of Onset  . Diabetes Mother   . HIV Mother   . Diabetes Father   . HIV Father   . Diabetes Sister   . Diabetes Brother   . Breast cancer Maternal Grandmother     Prior to Admission medications   Medication Sig Start Date End Date Taking? Authorizing Provider  EPINEPHrine 0.3 mg/0.3 mL IJ SOAJ injection Inject 0.3 mg into the muscle as needed (Bee Stings).   Yes Historical Provider, MD  albuterol (PROVENTIL HFA;VENTOLIN HFA) 108 (90 Base) MCG/ACT inhaler Inhale 2 puffs into the lungs every 6 (six) hours as needed for wheezing. Patient may resume home supply. Patient not taking: Reported on 09/18/2016 01/09/16   Standley Brooking, MD  beclomethasone (QVAR) 80 MCG/ACT inhaler Inhale 1 puff into the lungs 2 (two) times daily. Patient not taking: Reported on 11/17/2016 10/11/14   Gwyneth Sprout, MD  HYDROcodone-acetaminophen (NORCO/VICODIN) 5-325 MG tablet Take 1-2  tablets by mouth every 6 (six) hours as needed. Patient not taking: Reported on 11/17/2016 09/18/16   Vanetta MuldersScott Zackowski, MD  levETIRAcetam (KEPPRA) 500 MG tablet Take 1 tablet (500 mg total) by mouth 2 (two) times daily. Patient not taking: Reported on 11/17/2016 01/06/16   Kristen N Ward, DO  naproxen (NAPROSYN) 500 MG tablet Take 1 tablet (500 mg total) by mouth 2 (two) times daily. Patient not taking: Reported on 11/17/2016 09/18/16   Vanetta MuldersScott Zackowski, MD    norgestimate-ethinyl estradiol (ORTHO-CYCLEN,SPRINTEC,PREVIFEM) 0.25-35 MG-MCG tablet Take 1 tablet by mouth daily. Patient not taking: Reported on 09/18/2016 01/31/16   Tilda BurrowJohn V Ferguson, MD    Physical Exam:  Constitutional: NAD, calm, comfortable Vitals:   11/17/16 2009 11/17/16 2014 11/17/16 2100 11/17/16 2300  BP:  109/92 121/80 115/77  Pulse:  89 102 92  Resp:  21 17 14   SpO2:  100% 99% 100%  Weight: 81.6 kg (180 lb)      Eyes: PERRL, lids and conjunctivae normal ENMT: Mucous membranes are moist. Posterior pharynx clear of any exudate or lesions. Neck: normal, supple, no masses, no thyromegaly Respiratory: clear to auscultation bilaterally, no wheezing, no crackles. Normal respiratory effort. No accessory muscle use.  Cardiovascular: Regular rate and rhythm, no murmurs / rubs / gallops. No extremity edema. 2+ pedal pulses. No carotid bruits.  Abdomen: Soft, no tenderness, no masses palpated. No hepatosplenomegaly. Bowel sounds positive.  Musculoskeletal: no clubbing / cyanosis. No joint deformity upper and lower extremities. Good ROM, no contractures. Normal muscle tone.  Skin: no rashes, lesions, ulcers. No induration Neurologic: CN 2-12 grossly intact. Sensation intact, DTR normal. Strength 5/5 in all 4.  Psychiatric: Alert and oriented 4, anxious mood.   Labs on Admission: I have personally reviewed following labs and imaging studies  CBC:  Recent Labs Lab 11/17/16 2124  WBC 14.2*  NEUTROABS 9.1*  HGB 13.9  HCT 40.7  MCV 91.7  PLT 226   Basic Metabolic Panel:  Recent Labs Lab 11/17/16 2124 11/17/16 2155  NA 135  --   K 3.5  --   CL 107  --   CO2 21*  --   GLUCOSE 92  --   BUN 10  --   CREATININE 0.61  --   CALCIUM 9.0  --   MG  --  1.9  PHOS  --  3.7   GFR: Estimated Creatinine Clearance: 124.3 mL/min (by C-G formula based on SCr of 0.61 mg/dL). Liver Function Tests:  Recent Labs Lab 11/17/16 2155  AST 13*  ALT 12*  ALKPHOS 56  BILITOT 0.4   PROT 6.7  ALBUMIN 4.0   No results for input(s): LIPASE, AMYLASE in the last 168 hours. No results for input(s): AMMONIA in the last 168 hours. Coagulation Profile: No results for input(s): INR, PROTIME in the last 168 hours. Cardiac Enzymes: No results for input(s): CKTOTAL, CKMB, CKMBINDEX, TROPONINI in the last 168 hours. BNP (last 3 results) No results for input(s): PROBNP in the last 8760 hours. HbA1C: No results for input(s): HGBA1C in the last 72 hours. CBG: No results for input(s): GLUCAP in the last 168 hours. Lipid Profile: No results for input(s): CHOL, HDL, LDLCALC, TRIG, CHOLHDL, LDLDIRECT in the last 72 hours. Thyroid Function Tests: No results for input(s): TSH, T4TOTAL, FREET4, T3FREE, THYROIDAB in the last 72 hours. Anemia Panel: No results for input(s): VITAMINB12, FOLATE, FERRITIN, TIBC, IRON, RETICCTPCT in the last 72 hours. Urine analysis:    Component Value Date/Time   COLORURINE YELLOW  09/18/2016 1450   APPEARANCEUR CLEAR 09/18/2016 1450   LABSPEC 1.015 09/18/2016 1450   PHURINE 6.5 09/18/2016 1450   GLUCOSEU NEGATIVE 09/18/2016 1450   HGBUR NEGATIVE 09/18/2016 1450   BILIRUBINUR NEGATIVE 09/18/2016 1450   KETONESUR NEGATIVE 09/18/2016 1450   PROTEINUR NEGATIVE 09/18/2016 1450   UROBILINOGEN 0.2 03/30/2015 2201   NITRITE NEGATIVE 09/18/2016 1450   LEUKOCYTESUR NEGATIVE 09/18/2016 1450    Radiological Exams on Admission: No results found.   Assessment/Plan Principal Problem:   Pseudoseizures Initially admitted with status epilepticus, but obviously that is not the case. Admitted to telemetry earlier/inpatient. Discontinue Ativan. Per patient, she has had seizures since she was 20 years old. Needs to follow-up with neurology for treatment plan.  Active Problems:   PTSD (post-traumatic stress disorder)   ADHD (attention deficit hyperactivity disorder), combined type   Polysubstance abuse   Conduct disorder, adolescent onset type Needs to  follow up with behavioral health.      Pregnancy Will need to follow with OB/GYN.     DVT prophylaxis: Lovenox. Code Status: Full code. Family Communication: Her fianc, Charna ElizabethLuke Smith, was present in the room. Disposition Plan: Neurology and behavioral health evaluation. Consults called:  Admission status: Inpatient/telemetry.   Bobette Moavid Manuel Ortiz MD [facility] Pager [phone].  If 7PM-7AM, please contact night-coverage [email or web address] Password [email or web address]  11/17/2016, 11:15 PM

## 2016-11-17 NOTE — ED Provider Notes (Signed)
[facility] DEPT Provider Note   CSN: [id number] Arrival date & time: 11/17/16  2007     History   Chief Complaint Chief Complaint  Patient presents with  . Seizures    HPI Erin Krueger is a 20 y.o. female.  HPI  20 year old female who is [redacted] weeks pregnant. With a history of seizures on Keppra who was recently taken off her antiepileptic by her OB, presents with seizures. EMS reports that the patient had 7 seizures during their care, all lasting 30 seconds. Patient received 2 mg of Ativan in route. Per the patient's fiance prior to calling EMS the patient had 5 seizures also lasting 30 seconds.   Remainder of history, ROS, and physical exam limited due to patient's condition (post-ictal). Additional information was obtained from either EMS or family.   Level V Caveat.    Past Medical History:  Diagnosis Date  . ADHD (attention deficit hyperactivity disorder)   . Allergy   . Anxiety   . Asthma   . Eating disorder   . Headache(784.0)   . ODD (oppositional defiant disorder)   . PTSD (post-traumatic stress disorder)     Patient Active Problem List   Diagnosis Date Noted  . Status epilepticus (HCC) 11/17/2016  . PID (acute pelvic inflammatory disease) 01/29/2016  . Seizure disorder (HCC) 01/07/2016  . Seizure (HCC) 01/07/2016  . 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 PTSD (post-traumatic stress disorder) 05/12/2013  . 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 Polysubstance abuse 05/12/2013    Past Surgical History:  Procedure Laterality Date  . TONSILLECTOMY      OB History    Gravida Para Term Preterm AB Living   1             SAB TAB Ectopic Multiple Live Births                   Home Medications    Prior to Admission medications   Medication Sig Start Date End Date Taking? Authorizing Provider  EPINEPHrine 0.3 mg/0.3 mL IJ SOAJ injection Inject 0.3 mg into the muscle as needed (Bee Stings).    Yes Historical Provider, MD  albuterol (PROVENTIL HFA;VENTOLIN HFA) 108 (90 Base) MCG/ACT inhaler Inhale 2 puffs into the lungs every 6 (six) hours as needed for wheezing. Patient may resume home supply. Patient not taking: Reported on 09/18/2016 01/09/16   Standley Brookinganiel P Goodrich, MD  beclomethasone (QVAR) 80 MCG/ACT inhaler Inhale 1 puff into the lungs 2 (two) times daily. Patient not taking: Reported on 11/17/2016 10/11/14   Gwyneth SproutWhitney Plunkett, MD  HYDROcodone-acetaminophen (NORCO/VICODIN) 5-325 MG tablet Take 1-2 tablets by mouth every 6 (six) hours as needed. Patient not taking: Reported on 11/17/2016 09/18/16   Vanetta MuldersScott Zackowski, MD  levETIRAcetam (KEPPRA) 500 MG tablet Take 1 tablet (500 mg total) by mouth 2 (two) times daily. Patient not taking: Reported on 11/17/2016 01/06/16   Kristen N Ward, DO  naproxen (NAPROSYN) 500 MG tablet Take 1 tablet (500 mg total) by mouth 2 (two) times daily. Patient not taking: Reported on 11/17/2016 09/18/16   Vanetta MuldersScott Zackowski, MD  norgestimate-ethinyl estradiol (ORTHO-CYCLEN,SPRINTEC,PREVIFEM) 0.25-35 MG-MCG tablet Take 1 tablet by mouth daily. Patient not taking: Reported on 09/18/2016 01/31/16   Tilda BurrowJohn V Ferguson, MD    Family History History reviewed. No pertinent family history.  Social History Social History  Substance Use Topics  . Smoking status: Current Every Day Smoker    Packs/day: 1.00    Types: Cigarettes  Last attempt to quit: 07/21/2013  . Smokeless tobacco: Never Used  . Alcohol use Yes     Comment: 3-4 glassed vodka/beer     Allergies   Bee venom; Cocoa butter; Prednisone; and Latex   Review of Systems Review of Systems  Unable to perform ROS: Mental status change     Physical Exam Updated Vital Signs BP 121/80   Pulse 102   Resp 17   Wt 180 lb (81.6 kg)   LMP 07/12/2016 Comment: neg u preg  SpO2 99%   BMI 28.19 kg/m   Physical Exam  Constitutional: She appears well-developed and well-nourished. She appears lethargic. No  distress.  HENT:  Head: Normocephalic and atraumatic.  Nose: Nose normal.  Eyes: Conjunctivae and EOM are normal. Pupils are equal, round, and reactive to light. Right eye exhibits no discharge. Left eye exhibits no discharge. No scleral icterus.  Neck: Normal range of motion. Neck supple.  Cardiovascular: Normal rate and regular rhythm.  Exam reveals no gallop and no friction rub.   No murmur heard. Pulmonary/Chest: Effort normal and breath sounds normal. No stridor. No respiratory distress. She has no rales.  Abdominal: Soft. She exhibits no distension. There is no tenderness.  Musculoskeletal: She exhibits no edema or tenderness.  Neurological: She appears lethargic. She is disoriented.  Moves all extremities   Skin: Skin is warm and dry. No rash noted. She is not diaphoretic. No erythema.  Psychiatric: She has a normal mood and affect.  Vitals reviewed.    ED Treatments / Results  Labs (all labs ordered are listed, but only abnormal results are displayed) Labs Reviewed  CBC WITH DIFFERENTIAL/PLATELET - Abnormal; Notable for the following:       Result Value   WBC 14.2 (*)    Neutro Abs 9.1 (*)    Monocytes Absolute 1.2 (*)    All other components within normal limits  BASIC METABOLIC PANEL - Abnormal; Notable for the following:    CO2 21 (*)    All other components within normal limits  HCG, QUANTITATIVE, PREGNANCY  MAGNESIUM  PHOSPHORUS  HEPATIC FUNCTION PANEL    EKG  EKG Interpretation None       Radiology No results found.  Procedures Procedures (including critical care time)  Medications Ordered in ED Medications  levETIRAcetam (KEPPRA) IVPB 1000 mg/100 mL premix (1,000 mg Intravenous Given 11/17/16 2205)  LORazepam (ATIVAN) injection 2 mg (2 mg Intravenous Given 11/17/16 2042)  sodium chloride 0.9 % bolus 1,000 mL (1,000 mLs Intravenous New Bag/Given 11/17/16 2205)     Initial Impression / Assessment and Plan / ED Course  I have reviewed the  triage vital signs and the nursing notes.  Pertinent labs & imaging results that were available during my care of the patient were reviewed by me and considered in my medical decision making (see chart for details).  Clinical Course as of Nov 17 2209  Tue Nov 17, 2016  2100 Arrival patient is actively seizing. Additional 2 mg of Ativan were given.  [PC]  2150 Discussed case with Dr. Gerilyn Pilgrimoonquah, Neurolgy, who recommended loading patient on Keppra. He agreed with admission to the hospitalist team and contacting her obstetrician to coordinate appropriate antiepileptic treatment for the patient.  [PC]  2203 Patient initially had mentioned that she was taken off her Keppra by her OB doctor however now that she is back to baseline and more alert she reports that she was taken off her Keppra by her primary care provider [place]. [place]. She  saw Dr. Erskine Squibb [facility] of [facility] in [place] recently for follow up.  [PC]  2208 Screening labs obtained. We'll also obtain HCG to confirm pregnancy.  [PC]  2210 Patient admitted to stepdown unit under hospitalist service.  [PC]    Clinical Course User Index [PC] Nira Conn, MD      Final Clinical Impressions(s) / ED Diagnoses   Final diagnoses:  Seizure [facility])      Nira Conn, MD 11/17/16 2210

## 2016-11-17 NOTE — ED Notes (Signed)
Witnessed seizure that lasted about 30 seconds.

## 2016-11-17 NOTE — ED Triage Notes (Signed)
Pt had total of seven witnessed seizures by first responders and ems. Pt is pregnant and was taken off keppra. Pt was given 2mg  of ativan enroute.

## 2016-11-18 ENCOUNTER — Encounter (HOSPITAL_COMMUNITY): Payer: Self-pay | Admitting: Internal Medicine

## 2016-11-18 ENCOUNTER — Inpatient Hospital Stay (HOSPITAL_COMMUNITY)
Admit: 2016-11-18 | Discharge: 2016-11-18 | Disposition: A | Discharge status: Home / Self Care | Payer: Medicaid Other | Attending: Internal Medicine | Admitting: Internal Medicine

## 2016-11-18 DIAGNOSIS — Z3A01 Less than 8 weeks gestation of pregnancy: Secondary | ICD-10-CM

## 2016-11-18 DIAGNOSIS — F445 Conversion disorder with seizures or convulsions: Secondary | ICD-10-CM | POA: Diagnosis present

## 2016-11-18 DIAGNOSIS — F902 Attention-deficit hyperactivity disorder, combined type: Secondary | ICD-10-CM

## 2016-11-18 DIAGNOSIS — F912 Conduct disorder, adolescent-onset type: Secondary | ICD-10-CM

## 2016-11-18 DIAGNOSIS — R569 Unspecified convulsions: Secondary | ICD-10-CM | POA: Diagnosis present

## 2016-11-18 DIAGNOSIS — F1910 Other psychoactive substance abuse, uncomplicated: Secondary | ICD-10-CM

## 2016-11-18 LAB — COMPREHENSIVE METABOLIC PANEL
ALT: 12 U/L — ABNORMAL LOW (ref 14–54)
AST: 11 U/L — ABNORMAL LOW (ref 15–41)
Albumin: 3.5 g/dL (ref 3.5–5.0)
Alkaline Phosphatase: 50 U/L (ref 38–126)
Anion gap: 6 (ref 5–15)
BUN: 7 mg/dL (ref 6–20)
CO2: 22 mmol/L (ref 22–32)
Calcium: 8.6 mg/dL — ABNORMAL LOW (ref 8.9–10.3)
Chloride: 107 mmol/L (ref 101–111)
Creatinine, Ser: 0.5 mg/dL (ref 0.44–1.00)
GFR calc Af Amer: >60 mL/min (ref >60)
GFR calc non Af Amer: >60 mL/min (ref >60)
Glucose, Bld: 91 mg/dL (ref 65–99)
Potassium: 3.5 mmol/L (ref 3.5–5.1)
Sodium: 135 mmol/L (ref 135–145)
Total Bilirubin: 0.5 mg/dL (ref 0.3–1.2)
Total Protein: 6 g/dL — ABNORMAL LOW (ref 6.5–8.1)

## 2016-11-18 LAB — URINALYSIS, ROUTINE W REFLEX MICROSCOPIC
Bilirubin Urine: NEGATIVE
Glucose, UA: NEGATIVE mg/dL
Hgb urine dipstick: NEGATIVE
Ketones, ur: NEGATIVE mg/dL
Nitrite: NEGATIVE
Protein, ur: NEGATIVE mg/dL
Specific Gravity, Urine: 1.017 (ref 1.005–1.030)
pH: 6 (ref 5.0–8.0)

## 2016-11-18 LAB — CBC WITH DIFFERENTIAL/PLATELET
Basophils Absolute: 0 10*3/uL (ref 0.0–0.1)
Basophils Relative: 0 %
Eosinophils Absolute: 0.2 10*3/uL (ref 0.0–0.7)
Eosinophils Relative: 1 %
HCT: 37.5 % (ref 36.0–46.0)
Hemoglobin: 13.1 g/dL (ref 12.0–15.0)
Lymphocytes Relative: 27 %
Lymphs Abs: 3.4 10*3/uL (ref 0.7–4.0)
MCH: 32.3 pg (ref 26.0–34.0)
MCHC: 34.9 g/dL (ref 30.0–36.0)
MCV: 92.6 fL (ref 78.0–100.0)
Monocytes Absolute: 0.9 10*3/uL (ref 0.1–1.0)
Monocytes Relative: 7 %
Neutro Abs: 8 10*3/uL — ABNORMAL HIGH (ref 1.7–7.7)
Neutrophils Relative %: 65 %
Platelets: 215 10*3/uL (ref 150–400)
RBC: 4.05 MIL/uL (ref 3.87–5.11)
RDW: 11.9 % (ref 11.5–15.5)
WBC: 12.6 10*3/uL — ABNORMAL HIGH (ref 4.0–10.5)

## 2016-11-18 LAB — RAPID URINE DRUG SCREEN, HOSP PERFORMED
Amphetamines: NOT DETECTED
Barbiturates: NOT DETECTED
Benzodiazepines: POSITIVE — AB
Cocaine: NOT DETECTED
Opiates: NOT DETECTED
Tetrahydrocannabinol: POSITIVE — AB

## 2016-11-18 MED ORDER — PRENATAL MULTIVITAMIN CH: 1.0000 | ORAL_TABLET | Freq: Every day | ORAL | Status: DC

## 2016-11-18 MED ORDER — OXYCODONE-ACETAMINOPHEN 7.5-325 MG PO TABS
1.0000 | ORAL_TABLET | Freq: Three times a day (TID) | ORAL | Status: DC | PRN
  Administered 2016-11-19 – 2016-11-20 (×3): 1 via ORAL
  Filled 2016-11-18 (×3): qty 1

## 2016-11-18 MED ORDER — DIPHENHYDRAMINE HCL 50 MG/ML IJ SOLN
25.0000 mg | Freq: Once | INTRAMUSCULAR | Status: AC
  Administered 2016-11-18: 25 mg via INTRAVENOUS

## 2016-11-18 MED ORDER — PRENATAL MULTIVITAMIN CH
1.0000 | ORAL_TABLET | Freq: Every day | ORAL | Status: DC
  Administered 2016-11-19 – 2016-11-20 (×2): 1 via ORAL
  Filled 2016-11-18 (×6): qty 1

## 2016-11-18 MED ORDER — ACETAMINOPHEN 325 MG PO TABS
650.0000 mg | ORAL_TABLET | Freq: Four times a day (QID) | ORAL | Status: DC | PRN
  Administered 2016-11-18: 650 mg via ORAL
  Filled 2016-11-18: qty 2

## 2016-11-18 MED ORDER — DIPHENHYDRAMINE HCL 50 MG/ML IJ SOLN
INTRAMUSCULAR | Status: AC
  Administered 2016-11-18: 25 mg via INTRAVENOUS
  Filled 2016-11-18: qty 1

## 2016-11-18 MED ORDER — TRAMADOL HCL 50 MG PO TABS: 50.0000 mg | ORAL_TABLET | Freq: Three times a day (TID) | ORAL | Status: DC | PRN

## 2016-11-18 MED ORDER — LEVETIRACETAM 500 MG PO TABS
1000.0000 mg | ORAL_TABLET | Freq: Two times a day (BID) | ORAL | Status: DC
  Administered 2016-11-18 – 2016-11-20 (×4): 1000 mg via ORAL
  Filled 2016-11-18 (×4): qty 2

## 2016-11-18 MED ORDER — FOLIC ACID 1 MG PO TABS
2.0000 mg | ORAL_TABLET | Freq: Every day | ORAL | Status: DC
  Administered 2016-11-19 – 2016-11-20 (×2): 2 mg via ORAL
  Filled 2016-11-18 (×2): qty 2

## 2016-11-18 NOTE — [facility] Notes (Signed)
EEG completed, patient had seizure like episodes during EEG and tech aborted the seizure like episodes with an alcohol patch each time. Results pending

## 2016-11-18 NOTE — [facility] Notes (Signed)
Dr. Robb Matarrtiz in room with pt at this time.

## 2016-11-18 NOTE — [facility] Notes (Signed)
Patient ID: Erin Krueger, female   DOB: [birth year], 20 y.o.   MRN: [id number]  PROGRESS NOTE    Erin Krueger  [id number] DOB: [birth year] DOA: 11/17/2016  PCP: No PCP Per Patient   Brief Narrative:  20 y.o. female with medical history significant for ADHD, allergy, anxiety, asthma, eating disorder, oppositional defined disorder, PTSD, headaches. Patient presented to ED after having several seizures at home and during EMS transportation. Apparently, she is [redacted] weeks pregnant and was recently taken off of Keppra by her OB doctor. She was given IV Ativan by EMS and per EMS she has had several witnessed seizures in route to hospital. She was given total of 6 mg of Ativan and loaded with 1000 mg of Keppra TRH has discussed the case with Dr. Noel Christmasharles Stewart of neurology who did not feel there is a need [facility]. Order is placed for EEG while awaiting neurology consultation.   Assessment & Plan:   Principal Problem:   Pseudoseizures - Not entirely clear whether patient has true seizures versus pseudoseizures - She had one episode this morning where she was completely out of it but after few minutes she was completely fine, oriented to time, place and person - She was loaded with Keppra thousand milligrams on the admission but currently not on antiepileptics because she is pregnant - Appreciate neurology consultation and recommendation  Active Problems:   PTSD (post-traumatic stress disorder) / ADHD (attention deficit hyperactivity disorder), combined type / Polysubstance abuse / Conduct disorder, adolescent-onset type - Counseled on substance abuse - UDS positive for THC and benzos     Pregnancy - Beta hCG is in 64 K range    DVT prophylaxis:  use SCDs instead of Lovenox for DVT prophylaxis  Code Status: full code  Family Communication:  fianc at the bedside  Disposition Plan: Plan for EEG today   Consultants:   Neurology   Procedures:   None    Antimicrobials:   None    Subjective: Patient initially not responding to painful stimuli, her fianc at the bedside she had a seizure episode just a moment before me, into the room. There was no other witnesses. In about 5 minutes patient completely fine, alert, talking, asking for food.  Objective: Vitals:   11/17/16 2357 11/18/16 0000 11/18/16 0539 11/18/16 0600  BP: (!) 77/49 (!) 90/57  114/67  Pulse: 98 (!) 192  87  Resp: 18   18  Temp: 98.2 F (36.8 C)   98.5 F (36.9 C)  TempSrc: Oral   Oral  SpO2: 100%   99%  Weight: 75.8 kg (167 lb 1.6 oz)     Height:   5\' 8"  (1.727 m)     Intake/Output Summary (Last 24 hours) at 11/18/16 [id number] Last data filed at 11/18/16 0600  Gross per 24 hour  Intake           297.92 ml  Output              120 ml  Net           177.92 ml   Filed Weights   11/17/16 2009 11/17/16 2357  Weight: 81.6 kg (180 lb) 75.8 kg (167 lb 1.6 oz)    Examination:  General exam: Appears calm and comfortable  Respiratory system: Clear to auscultation. Respiratory effort normal. Cardiovascular system: S1 & S2 heard, RRR. No JVD, murmurs, rubs, gallops or clicks. No pedal edema. Gastrointestinal system: Abdomen is nondistended, soft and nontender.  No organomegaly or masses felt. Normal bowel sounds heard. Central nervous system: Alert and oriented. No focal neurological deficits. Extremities: Symmetric 5 x 5 power. Skin: No rashes, lesions or ulcers Psychiatry: Judgement and insight appear normal. Mood & affect appropriate.   Data Reviewed: I have personally reviewed following labs and imaging studies  CBC:  Recent Labs Lab 11/17/16 2124 11/18/16 0609  WBC 14.2* 12.6*  NEUTROABS 9.1* 8.0*  HGB 13.9 13.1  HCT 40.7 37.5  MCV 91.7 92.6  PLT 226 215   Basic Metabolic Panel:  Recent Labs Lab 11/17/16 2124 11/17/16 2155 11/18/16 0609  NA 135  --  135  K 3.5  --  3.5  CL 107  --  107  CO2 21*  --  22  GLUCOSE 92  --  91  BUN 10  --  7   CREATININE 0.61  --  0.50  CALCIUM 9.0  --  8.6*  MG  --  1.9  --   PHOS  --  3.7  --    GFR: Estimated Creatinine Clearance: 114.1 mL/min (by C-G formula based on SCr of 0.5 mg/dL). Liver Function Tests:  Recent Labs Lab 11/17/16 2155 11/18/16 0609  AST 13* 11*  ALT 12* 12*  ALKPHOS 56 50  BILITOT 0.4 0.5  PROT 6.7 6.0*  ALBUMIN 4.0 3.5   No results for input(s): LIPASE, AMYLASE in the last 168 hours. No results for input(s): AMMONIA in the last 168 hours. Coagulation Profile: No results for input(s): INR, PROTIME in the last 168 hours. Cardiac Enzymes: No results for input(s): CKTOTAL, CKMB, CKMBINDEX, TROPONINI in the last 168 hours. BNP (last 3 results) No results for input(s): PROBNP in the last 8760 hours. HbA1C: No results for input(s): HGBA1C in the last 72 hours. CBG: No results for input(s): GLUCAP in the last 168 hours. Lipid Profile: No results for input(s): CHOL, HDL, LDLCALC, TRIG, CHOLHDL, LDLDIRECT in the last 72 hours. Thyroid Function Tests: No results for input(s): TSH, T4TOTAL, FREET4, T3FREE, THYROIDAB in the last 72 hours. Anemia Panel: No results for input(s): VITAMINB12, FOLATE, FERRITIN, TIBC, IRON, RETICCTPCT in the last 72 hours. Urine analysis:    Component Value Date/Time   COLORURINE YELLOW 11/18/2016 0049   APPEARANCEUR CLEAR 11/18/2016 0049   LABSPEC 1.017 11/18/2016 0049   PHURINE 6.0 11/18/2016 0049   GLUCOSEU NEGATIVE 11/18/2016 0049   HGBUR NEGATIVE 11/18/2016 0049   BILIRUBINUR NEGATIVE 11/18/2016 0049   KETONESUR NEGATIVE 11/18/2016 0049   PROTEINUR NEGATIVE 11/18/2016 0049   UROBILINOGEN 0.2 03/30/2015 2201   NITRITE NEGATIVE 11/18/2016 0049   LEUKOCYTESUR TRACE (A) 11/18/2016 0049   Sepsis Labs: @LABRCNTIP (procalcitonin:4,lacticidven:4)   )No results found for this or any previous visit (from the past 240 hour(s)).    Radiology Studies: No results found.   Scheduled Meds: . enoxaparin (LOVENOX) injection  40  mg Subcutaneous Q24H   Continuous Infusions: . 0.9 % NaCl with KCl 20 mEq / L 125 mL/hr at 11/18/16 0037     LOS: 1 day    Time spent: 25 minutes  Greater than 50% of the time spent on counseling and coordinating the care.   Manson PasseyEVINE, [place], MD [facility] Pager [phone]  If 7PM-7AM, please contact night-coverage [email or web address] Password [email or web address] 11/18/2016, 9:22 AM

## 2016-11-18 NOTE — [facility] Notes (Signed)
During interview questions pt noted to stare off and was not answering questions.  Vitals were taken and pt's BP was hypotensive.  Within 5 second pt's eyes rolled back and pt began shaking.  This lasted for around 30-45 seconds.  Pt was still for about 20 seconds and began shaking again.  This happened about 5 times.  Pt was turned on her side.  Suction was set up.  Seizure precautions in place.

## 2016-11-18 NOTE — [facility] Notes (Signed)
Pt making comments such as "i'll just leave and have a seizure and die" "no one loves me and my baby" "the baby is already dead" "I don't care if I have a seizure and lose the baby"

## 2016-11-18 NOTE — Procedures (Signed)
  [facility] Atiya Yera A. Maral Lampe, MD     [email or web address]           HISTORY: 20 Y/O with frequent SZ  MEDICATIONS: SchedGerilyn Pilgrimuled Meds: Continuous Infusions: . 0.9 % NaCl with KCl 20 mEq / L 125 mL/hr at 11/18/16 1229   PRN Meds:.acetaminophen, traMADol  Prior to Admission medications   Medication Sig Start Date End Date Taking? Authorizing Provider  EPINEPHrine 0.3 mg/0.3 mL IJ SOAJ injection Inject 0.3 mg into the muscle as needed (Bee Stings).   Yes Historical Provider, MD  albuterol (PROVENTIL HFA;VENTOLIN HFA) 108 (90 Base) MCG/ACT inhaler Inhale 2 puffs into the lungs every 6 (six) hours as needed for wheezing. Patient may resume home supply. Patient not taking: Reported on 09/18/2016 01/09/16   Standley Brookinganiel P Goodrich, MD  beclomethasone (QVAR) 80 MCG/ACT inhaler Inhale 1 puff into the lungs 2 (two) times daily. Patient not taking: Reported on 11/17/2016 10/11/14   Gwyneth SproutWhitney Plunkett, MD  HYDROcodone-acetaminophen (NORCO/VICODIN) 5-325 MG tablet Take 1-2 tablets by mouth every 6 (six) hours as needed. Patient not taking: Reported on 11/17/2016 09/18/16   Vanetta MuldersScott Zackowski, MD  levETIRAcetam (KEPPRA) 500 MG tablet Take 1 tablet (500 mg total) by mouth 2 (two) times daily. Patient not taking: Reported on 11/17/2016 01/06/16   Kristen N Ward, DO  naproxen (NAPROSYN) 500 MG tablet Take 1 tablet (500 mg total) by mouth 2 (two) times daily. Patient not taking: Reported on 11/17/2016 09/18/16   Vanetta MuldersScott Zackowski, MD  norgestimate-ethinyl estradiol (ORTHO-CYCLEN,SPRINTEC,PREVIFEM) 0.25-35 MG-MCG tablet Take 1 tablet by mouth daily. Patient not taking: Reported on 09/18/2016 01/31/16   Tilda BurrowJohn V Ferguson, MD      ANALYSIS: A 16 channel recording using standard 10 20 measurements is conducted for 21 minutes. There is a dominant low voltage posterior rhythm of 16 Hz which attenuates with eye opening. There is beta activity seen over the frontal fields. Awake and drowsy activities are seen.  Photic stimulation did not elicit any abnormal responses. There is no focal slowing or lateralized slowing. Head movements during the recording was not associated with electrographic correlates. However, there is a single sharp wave activity which phase reverses at T5.     IMPRESSION: This recording is slightly abnormal showing a single sharp wave activity involving the left temporal region which can be associated clinically with partial onset seizures.        Jaiyana Canale A. Gerilyn Pilgrimoonquah, M.D.  Diplomate, Biomedical engineerAmerican Board of Psychiatry and Neurology ( Neurology).

## 2016-11-18 NOTE — Consult Note (Addendum)
[place] A. Merlene Laughter, MD     [email or web address]          Erin Krueger is an 20 y.o. female.   ASSESSMENT/PLAN: 1. 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 Consequently, the patient should be on antiepileptic medications. I will restart the patient on Keppra but at a higher -dose of thousand milligrams twice a day. She should also be on prenatal vitamins and high-dose folic acid 2 mg daily. Seizure precaution including no driving or operating machinery. Consultation with the obstetrician will be obtained. 2. Multiple psychiatric issues. 3. Low back pain related to injuries sustained from multiple seizures. I would discontinue the tramadol as this lowers the seizure threshold. She'll be given Percocet short-term. Acetaminophen is preferred but not effective and the patient insisting on additional medications. 4. High risk first trimester pregnancy     The patient is a 20 year old white female who presents with a history of seizures. The patient on presented to the floor was threatening to leave [place]. Her boyfriend previously this had related to the staff and myself that she may have been suicidal but the patient categorically and adamantly denies this. She however want to leave against medical advise until we had extensive discussions with the patient. Patient tells me that she has a long-standing history of seizures since she was 20 years old. The patient cannot give me a description of the semiology. Her mother apparently witnesses the episodes but she is not here. The boyfriend relates that she has been having these spells about every other day but since she has been off the [place] for the last 5 weeks her spells have become more frequent. She was told to stop the Keppra by an emergency room physician at [facility] and not by her obstetrician. She currently does not have an obstetrician for her current  pregnancy. The patient cannot provide a history as far as which neurologist she is seen in the past. From what I can ascertain from the significant other and from the patient, it appears that she becomes unresponsive with eye rolling activity and then she has convulsions. She has had oral trauma (biting her tongue )and urinary incontinence with some of these events. She apparently had multiple events on yesterday when she presented and hence she was admitted. She does have risk factors for seizures including being born 2 months premature, head injury in the past and possibly CNS infection although she is not quite certain about this. There is a family history of seizures initially. The patient has not completed high school. She has had some delay progression through high school. She probably has only a couple credits left complete high school however.  The patient complains of having numbness of the right buttock region today after she fell with her her initial seizure. She reports having quite a lot of back pain and pelvic pain since she fell and had her accidents. She has been given Tylenol. Did relay to her that this is a safest medication but she relates that it is not effective and thinks she needs additional treatment.     [[[[[[[[[[[[[[[[[ER NOTE 2014 17yf presenting after intentional overdose. Took 22 tabs of ibuprofen. Not sure of strength. Took around 1620 today. Unhappy with staff at group home and intention was to kill herself. Denies co-ingestion aside from prescribed meds which she reports taking appropriately. Denies HI. No hallucinations. Past hx of ADHD, PTSD, ODD. Reports previous suicide attempts by hanging and  cutting. No somatic complaints at this time.]]]]]]]]]]]]]]]]]]]]]]]]]]]]]       GENERAL: NAD  HEENT:  normal   ABDOMEN: soft  EXTREMITIES: No edema   BACK: No obvious injuries but significant pain on palpation is noted low back region.   SKIN: Normal by  inspection.    MENTAL STATUS: Alert and oriented. Affect is normal. Speech, language and cognition are generally intact. Judgment and insight normal.   CRANIAL NERVES: Pupils are equal, round and reactive to light and accomodation; extra ocular movements are full, there is no significant nystagmus; visual fields are full; upper and lower facial muscles are normal in strength and symmetric, there is no flattening of the nasolabial folds; tongue is midline; uvula is midline; shoulder elevation is normal.  MOTOR: Normal tone, bulk and strength; no pronator drift.  COORDINATION: Left finger to nose is normal, right finger to nose is normal, No rest tremor; no intention tremor; no postural tremor; no bradykinesia.  REFLEXES: Deep tendon reflexes are symmetrical and normal. .   SENSATION: Normal to light touch.  GAIT: Normal.         Blood pressure (!) 92/54, pulse 76, temperature 98.2 F (36.8 C), temperature source Oral, resp. rate 18, height '5\' 8"'$  (1.727 m), weight 167 lb 1.6 oz (75.8 kg), last menstrual period 07/12/2016, SpO2 100 %.  Past Medical History:  Diagnosis Date  . ADHD (attention deficit hyperactivity disorder)   . Allergy   . Anxiety   . Asthma   . Eating disorder   . Headache(784.0)   . ODD (oppositional defiant disorder)   . PTSD (post-traumatic stress disorder)   . Seizures ([place])     Past Surgical History:  Procedure Laterality Date  . TONSILLECTOMY      Family History  Problem Relation Age of Onset  . Diabetes Mother   . HIV Mother   . Diabetes Father   . HIV Father   . Diabetes Sister   . Diabetes Brother   . Breast cancer Maternal Grandmother     Social History:  reports that she has been smoking Cigarettes.  She has been smoking about 1.00 pack per day. She has never used smokeless tobacco. She reports that she drinks alcohol. She reports that she uses drugs, including Marijuana and Cocaine.  Allergies:  Allergies  Allergen Reactions  . Bee  Venom Anaphylaxis  . Cocoa Butter   . Prednisone Other (See Comments)    Bleeding nose bleeding and internal bleeding  . Latex Rash    Medications: Prior to Admission medications   Medication Sig Start Date End Date Taking? Authorizing Provider  EPINEPHrine 0.3 mg/0.3 mL IJ SOAJ injection Inject 0.3 mg into the muscle as needed (Bee Stings).   Yes Historical Provider, MD  albuterol (PROVENTIL HFA;VENTOLIN HFA) 108 (90 Base) MCG/ACT inhaler Inhale 2 puffs into the lungs every 6 (six) hours as needed for wheezing. Patient may resume home supply. Patient not taking: Reported on 09/18/2016 01/09/16   Samuella Cota, MD  beclomethasone (QVAR) 80 MCG/ACT inhaler Inhale 1 puff into the lungs 2 (two) times daily. Patient not taking: Reported on 11/17/2016 10/11/14   Blanchie Dessert, MD  HYDROcodone-acetaminophen (NORCO/VICODIN) 5-325 MG tablet Take 1-2 tablets by mouth every 6 (six) hours as needed. Patient not taking: Reported on 11/17/2016 09/18/16   Fredia Sorrow, MD  levETIRAcetam (KEPPRA) 500 MG tablet Take 1 tablet (500 mg total) by mouth 2 (two) times daily. Patient not taking: Reported on 11/17/2016 01/06/16   [place],  DO  naproxen (NAPROSYN) 500 MG tablet Take 1 tablet (500 mg total) by mouth 2 (two) times daily. Patient not taking: Reported on 11/17/2016 09/18/16   Fredia Sorrow, MD  norgestimate-ethinyl estradiol (ORTHO-CYCLEN,SPRINTEC,PREVIFEM) 0.25-35 MG-MCG tablet Take 1 tablet by mouth daily. Patient not taking: Reported on 09/18/2016 01/31/16   Jonnie Kind, MD    Scheduled Meds: Continuous Infusions: . 0.9 % NaCl with KCl 20 mEq / L 125 mL/hr at 11/18/16 1229   PRN Meds:.acetaminophen, traMADol     Results for orders placed or performed during the hospital encounter of 11/17/16 (from the past 48 hour(s))  CBC with Differential/Platelet     Status: Abnormal   Collection Time: 11/17/16  9:24 PM  Result Value Ref Range   WBC 14.2 (H) 4.0 - 10.5 K/uL   RBC  4.44 3.87 - 5.11 MIL/uL   Hemoglobin 13.9 12.0 - 15.0 g/dL   HCT 40.7 36.0 - 46.0 %   MCV 91.7 78.0 - 100.0 fL   MCH 31.3 26.0 - 34.0 pg   MCHC 34.2 30.0 - 36.0 g/dL   RDW 11.8 11.5 - 15.5 %   Platelets 226 150 - 400 K/uL   Neutrophils Relative % 65 %   Neutro Abs 9.1 (H) 1.7 - 7.7 K/uL   Lymphocytes Relative 26 %   Lymphs Abs 3.7 0.7 - 4.0 K/uL   Monocytes Relative 8 %   Monocytes Absolute 1.2 (H) 0.1 - 1.0 K/uL   Eosinophils Relative 1 %   Eosinophils Absolute 0.2 0.0 - 0.7 K/uL   Basophils Relative 0 %   Basophils Absolute 0.0 0.0 - 0.1 K/uL  Basic metabolic panel     Status: Abnormal   Collection Time: 11/17/16  9:24 PM  Result Value Ref Range   Sodium 135 135 - 145 mmol/L   Potassium 3.5 3.5 - 5.1 mmol/L   Chloride 107 101 - 111 mmol/L   CO2 21 (L) 22 - 32 mmol/L   Glucose, Bld 92 65 - 99 mg/dL   BUN 10 6 - 20 mg/dL   Creatinine, Ser 0.61 0.44 - 1.00 mg/dL   Calcium 9.0 8.9 - 10.3 mg/dL   GFR calc non Af Amer >60 >60 mL/min   GFR calc Af Amer >60 >60 mL/min    Comment: (NOTE) The eGFR has been calculated using the CKD EPI equation. This calculation has not been validated in all clinical situations. eGFR's persistently <60 mL/min signify possible Chronic Kidney Disease.    Anion gap 7 5 - 15  hCG, quantitative, pregnancy     Status: Abnormal   Collection Time: 11/17/16  9:24 PM  Result Value Ref Range   hCG, Beta Chain, Quant, S 64,157 (H) <5 mIU/mL    Comment:          GEST. AGE      CONC.  (mIU/mL)   <=1 WEEK        5 - 50     2 WEEKS       50 - 500     3 WEEKS       100 - 10,000     4 WEEKS     1,000 - 30,000     5 WEEKS     3,500 - 115,000   6-8 WEEKS     12,000 - 270,000    12 WEEKS     15,000 - 220,000        FEMALE AND NON-PREGNANT FEMALE:     LESS THAN 5  mIU/mL   Magnesium     Status: None   Collection Time: 11/17/16  9:55 PM  Result Value Ref Range   Magnesium 1.9 1.7 - 2.4 mg/dL  Phosphorus     Status: None   Collection Time: 11/17/16  9:55 PM   Result Value Ref Range   Phosphorus 3.7 2.5 - 4.6 mg/dL  Hepatic function panel     Status: Abnormal   Collection Time: 11/17/16  9:55 PM  Result Value Ref Range   Total Protein 6.7 6.5 - 8.1 g/dL   Albumin 4.0 3.5 - 5.0 g/dL   AST 13 (L) 15 - 41 U/L   ALT 12 (L) 14 - 54 U/L   Alkaline Phosphatase 56 38 - 126 U/L   Total Bilirubin 0.4 0.3 - 1.2 mg/dL   Bilirubin, Direct <0.1 (L) 0.1 - 0.5 mg/dL   Indirect Bilirubin NOT CALCULATED 0.3 - 0.9 mg/dL  Urine rapid drug screen (hosp performed)     Status: Abnormal   Collection Time: 11/18/16 12:49 AM  Result Value Ref Range   Opiates NONE DETECTED NONE DETECTED   Cocaine NONE DETECTED NONE DETECTED   Benzodiazepines POSITIVE (A) NONE DETECTED   Amphetamines NONE DETECTED NONE DETECTED   Tetrahydrocannabinol POSITIVE (A) NONE DETECTED   Barbiturates NONE DETECTED NONE DETECTED    Comment:        DRUG SCREEN FOR MEDICAL PURPOSES ONLY.  IF CONFIRMATION IS NEEDED FOR ANY PURPOSE, NOTIFY LAB WITHIN 5 DAYS.        LOWEST DETECTABLE LIMITS FOR URINE DRUG SCREEN Drug Class       Cutoff (ng/mL) Amphetamine      1000 Barbiturate      200 Benzodiazepine   161 Tricyclics       096 Opiates          300 Cocaine          300 THC              50   Urinalysis, Routine w reflex microscopic     Status: Abnormal   Collection Time: 11/18/16 12:49 AM  Result Value Ref Range   Color, Urine YELLOW YELLOW   APPearance CLEAR CLEAR   Specific Gravity, Urine 1.017 1.005 - 1.030   pH 6.0 5.0 - 8.0   Glucose, UA NEGATIVE NEGATIVE mg/dL   Hgb urine dipstick NEGATIVE NEGATIVE   Bilirubin Urine NEGATIVE NEGATIVE   Ketones, ur NEGATIVE NEGATIVE mg/dL   Protein, ur NEGATIVE NEGATIVE mg/dL   Nitrite NEGATIVE NEGATIVE   Leukocytes, UA TRACE (A) NEGATIVE   RBC / HPF 0-5 0 - 5 RBC/hpf   WBC, UA 6-30 0 - 5 WBC/hpf   Bacteria, UA MANY (A) NONE SEEN   Mucous PRESENT   CBC WITH DIFFERENTIAL     Status: Abnormal   Collection Time: 11/18/16  6:09 AM  Result  Value Ref Range   WBC 12.6 (H) 4.0 - 10.5 K/uL   RBC 4.05 3.87 - 5.11 MIL/uL   Hemoglobin 13.1 12.0 - 15.0 g/dL   HCT 37.5 36.0 - 46.0 %   MCV 92.6 78.0 - 100.0 fL   MCH 32.3 26.0 - 34.0 pg   MCHC 34.9 30.0 - 36.0 g/dL   RDW 11.9 11.5 - 15.5 %   Platelets 215 150 - 400 K/uL   Neutrophils Relative % 65 %   Neutro Abs 8.0 (H) 1.7 - 7.7 K/uL   Lymphocytes Relative 27 %   Lymphs Abs 3.4 0.7 - 4.0 K/uL  Monocytes Relative 7 %   Monocytes Absolute 0.9 0.1 - 1.0 K/uL   Eosinophils Relative 1 %   Eosinophils Absolute 0.2 0.0 - 0.7 K/uL   Basophils Relative 0 %   Basophils Absolute 0.0 0.0 - 0.1 K/uL  Comprehensive metabolic panel     Status: Abnormal   Collection Time: 11/18/16  6:09 AM  Result Value Ref Range   Sodium 135 135 - 145 mmol/L   Potassium 3.5 3.5 - 5.1 mmol/L   Chloride 107 101 - 111 mmol/L   CO2 22 22 - 32 mmol/L   Glucose, Bld 91 65 - 99 mg/dL   BUN 7 6 - 20 mg/dL   Creatinine, Ser 0.50 0.44 - 1.00 mg/dL   Calcium 8.6 (L) 8.9 - 10.3 mg/dL   Total Protein 6.0 (L) 6.5 - 8.1 g/dL   Albumin 3.5 3.5 - 5.0 g/dL   AST 11 (L) 15 - 41 U/L   ALT 12 (L) 14 - 54 U/L   Alkaline Phosphatase 50 38 - 126 U/L   Total Bilirubin 0.5 0.3 - 1.2 mg/dL   GFR calc non Af Amer >60 >60 mL/min   GFR calc Af Amer >60 >60 mL/min    Comment: (NOTE) The eGFR has been calculated using the CKD EPI equation. This calculation has not been validated in all clinical situations. eGFR's persistently <60 mL/min signify possible Chronic Kidney Disease.    Anion gap 6 5 - 15    Studies/Results:  HEAD CT FINDINGS: The ventricles are normal in size and configuration. There is no intracranial mass, hemorrhage, extra-axial fluid collection, or midline shift. Gray-white compartments appear normal. No acute infarct evident. Bony calvarium appears intact. The mastoid air cells are clear. There is a small retention cyst in each maxillary antrum. No intraorbital lesions are  apparent.  IMPRESSION: Mild maxillary sinus disease bilaterally. No intracranial mass, hemorrhage, or extra-axial fluid collection. Gray-white compartments appear normal. No evidence of acute infarct.   EEG This recording is slightly abnormal showing a single sharp wave activity involving the left temporal region which can be associated clinically with partial onset seizures.   Erez Mccallum A. Merlene Laughter, M.D.  Diplomate, Tax adviser of Psychiatry and Neurology ( Neurology). 11/18/2016, 4:43 PM

## 2016-11-18 NOTE — ACP (Advance Care Planning) (Signed)
I gave and discussed the Advance Directives with Erin Krueger. Plan to complete tomorrow.

## 2016-11-19 LAB — GLUCOSE, CAPILLARY: Glucose-Capillary: 89 mg/dL (ref 65–99)

## 2016-11-19 MED ORDER — LORAZEPAM 2 MG/ML IJ SOLN
INTRAMUSCULAR | Status: AC
  Administered 2016-11-19: 2 mg via INTRAVENOUS
  Filled 2016-11-19: qty 1

## 2016-11-19 MED ORDER — LORAZEPAM 2 MG/ML IJ SOLN
2.0000 mg | Freq: Once | INTRAMUSCULAR | Status: AC
  Administered 2016-11-19: 2 mg via INTRAVENOUS

## 2016-11-19 NOTE — Progress Notes (Signed)
Called to room, patient on the toilet and it was reported to me that she had a seizure on the toilet.  Other staff RN and NT was in the room with the patient.  She is on the toilet at this time and small jerking was observed that lasted 5-10 seconds, wheelchair brought to assist the patient back to the bed, when the patient's name was called to help stand, she stood, pivoted and sat down in the wheelchair.  She was assisted back to bed and she got out of the wheelchair and laid across the bed.  She was then instructed to straighten herself up in the bed and the patient did all with her eyes closed, seizure precautions in place-pads on bed, suction set up, oxygen set up.

## 2016-11-19 NOTE — Progress Notes (Signed)
Patient ID: Erin BertholdWillow Krueger, female   DOB: 1996/06/17, 20 y.o.   MRN: [id number]  PROGRESS NOTE    Erin BertholdWillow Krueger  YQM:[id number] DOB: 1996/06/17 DOA: 11/17/2016  PCP: No PCP Per Patient   Brief Narrative:  20 y.o. female with medical history significant for ADHD, allergy, anxiety, asthma, eating disorder, oppositional defined disorder, PTSD, headaches. Patient presented to ED after having several seizures at home and during EMS transportation. Apparently, she is [redacted] weeks pregnant and was recently taken off of Keppra by her OB doctor. She was given IV Ativan by EMS and per EMS she has had several witnessed seizures in route to hospital. She was given total of 6 mg of Ativan and loaded with 1000 mg of Keppra TRH has discussed the case with Dr. Noel Christmasharles Stewart of neurology who did not feel there is a need for transfer to [facility] at this time. She has been seen by neurology consultation who recommended resuming Keppra along with high-dose folic acid considering patient's pregnancy. EEG was done and showed possibly partial seizures.   Assessment & Plan:   Principal Problem:   Seizure disorder - Patient seen by neurology, recommended resuming Keppra along with high-dose folic acid considering patient's pregnancy - EEG concerning for partial seizures - Patient is currently on Keppra 1000 milligrams twice daily - We'll follow-up on neurology recommendations  Active Problems:   PTSD (post-traumatic stress disorder) / ADHD (attention deficit hyperactivity disorder), combined type / Polysubstance abuse / Conduct disorder, adolescent-onset type - Counseled on substance abuse - UDS positive for THC and benzos     Pregnancy - Beta hCG is in 64 K range  - Folic acid 2 mg daily along with prenatal vitamin   DVT prophylaxis:  SCDs bilaterally Code Status: full code  Family Communication:  fianc at the bedside  Disposition Plan: Home once cleared by neurology   Consultants:   Neurology    Procedures:   EEG 11/18/2016 - partial onset seizures   Antimicrobials:   None    Subjective: Patient has recurrent seizures as reported by her fianc at bedside.  Objective: Vitals:   11/18/16 0600 11/18/16 1315 11/18/16 2056 11/19/16 0500  BP: 114/67 (!) 92/54 117/68 129/68  Pulse: 87 76 79 78  Resp: 18 18 18 19   Temp: 98.5 F (36.9 C) 98.2 F (36.8 C) 98.7 F (37.1 C) 97.4 F (36.3 C)  TempSrc: Oral Oral Oral Oral  SpO2: 99% 100% 98% 100%  Weight:      Height:        Intake/Output Summary (Last 24 hours) at 11/19/16 1006 Last data filed at 11/18/16 1800  Gross per 24 hour  Intake             1615 ml  Output                0 ml  Net             1615 ml   Filed Weights   11/17/16 2009 11/17/16 2357  Weight: 81.6 kg (180 lb) 75.8 kg (167 lb 1.6 oz)    Examination:  General exam: Appears calm and comfortable, No distress Respiratory system: No wheezing, no rhonchi Cardiovascular system: S1 & S2 heard, rate controlled Gastrointestinal system: Appreciate bowel sounds, nontender abdomen Central nervous system: No focal deficits Extremities: Symmetric 5 x 5 power. No edema Skin: No rashes, lesions or ulcers, skin is warm and dry Psychiatry: Normal mood and behavior  Data Reviewed: I have personally reviewed following labs  and imaging studies  CBC:  Recent Labs Lab 11/17/16 2124 11/18/16 0609  WBC 14.2* 12.6*  NEUTROABS 9.1* 8.0*  HGB 13.9 13.1  HCT 40.7 37.5  MCV 91.7 92.6  PLT 226 215   Basic Metabolic Panel:  Recent Labs Lab 11/17/16 2124 11/17/16 2155 11/18/16 0609  NA 135  --  135  K 3.5  --  3.5  CL 107  --  107  CO2 21*  --  22  GLUCOSE 92  --  91  BUN 10  --  7  CREATININE 0.61  --  0.50  CALCIUM 9.0  --  8.6*  MG  --  1.9  --   PHOS  --  3.7  --    GFR: Estimated Creatinine Clearance: 114.1 mL/min (by C-G formula based on SCr of 0.5 mg/dL). Liver Function Tests:  Recent Labs Lab 11/17/16 2155 11/18/16 0609  AST 13*  11*  ALT 12* 12*  ALKPHOS 56 50  BILITOT 0.4 0.5  PROT 6.7 6.0*  ALBUMIN 4.0 3.5   No results for input(s): LIPASE, AMYLASE in the last 168 hours. No results for input(s): AMMONIA in the last 168 hours. Coagulation Profile: No results for input(s): INR, PROTIME in the last 168 hours. Cardiac Enzymes: No results for input(s): CKTOTAL, CKMB, CKMBINDEX, TROPONINI in the last 168 hours. BNP (last 3 results) No results for input(s): PROBNP in the last 8760 hours. HbA1C: No results for input(s): HGBA1C in the last 72 hours. CBG: No results for input(s): GLUCAP in the last 168 hours. Lipid Profile: No results for input(s): CHOL, HDL, LDLCALC, TRIG, CHOLHDL, LDLDIRECT in the last 72 hours. Thyroid Function Tests: No results for input(s): TSH, T4TOTAL, FREET4, T3FREE, THYROIDAB in the last 72 hours. Anemia Panel: No results for input(s): VITAMINB12, FOLATE, FERRITIN, TIBC, IRON, RETICCTPCT in the last 72 hours. Urine analysis:    Component Value Date/Time   COLORURINE YELLOW 11/18/2016 0049   APPEARANCEUR CLEAR 11/18/2016 0049   LABSPEC 1.017 11/18/2016 0049   PHURINE 6.0 11/18/2016 0049   GLUCOSEU NEGATIVE 11/18/2016 0049   HGBUR NEGATIVE 11/18/2016 0049   BILIRUBINUR NEGATIVE 11/18/2016 0049   KETONESUR NEGATIVE 11/18/2016 0049   PROTEINUR NEGATIVE 11/18/2016 0049   UROBILINOGEN 0.2 03/30/2015 2201   NITRITE NEGATIVE 11/18/2016 0049   LEUKOCYTESUR TRACE (A) 11/18/2016 0049   Sepsis Labs: @LABRCNTIP (procalcitonin:4,lacticidven:4)   )No results found for this or any previous visit (from the past 240 hour(s)).    Radiology Studies: No results found.   Scheduled Meds: . folic acid  2 mg Oral Daily  . levETIRAcetam  1,000 mg Oral BID  . prenatal multivitamin  1 tablet Oral Q1200   Continuous Infusions: . 0.9 % NaCl with KCl 20 mEq / L 125 mL/hr at 11/18/16 1229     LOS: 2 days    Time spent: 15 minutes  Greater than 50% of the time spent on counseling and  coordinating the care.   Manson PasseyEVINE, Sandford Diop, MD [facility] Pager [phone]  If 7PM-7AM, please contact night-coverage [email or web address] Password [email or web address] 11/19/2016, 10:06 AM

## 2016-11-19 NOTE — Progress Notes (Signed)
Patient's boyfriend came to the desk and said "she is saying she's going to kill herself."  I entered the room and the patient was in the bathroom with the door closed.  I opened the door and she was sitting on the floor against the wall.  I asked her what was she doing, she stated "Just sitting here, doing nothing." I asked did she say she was going to kill herself, she stated " No, I did not.  My boyfriend has mental issues and just says things."  She got up off the floor and sat on her bed, called her boyfriend to come back to the room, Dr. Gerilyn Pilgrimoonquah in the room the entire time and is now talking to the patient.

## 2016-11-19 NOTE — Progress Notes (Signed)
Called to room, patient is requesting to be discharged.  I told her the doctor hasn't discharged her and that the neurologist needs to see her.  I noted her IV out, she stated "I took it out; my mom is a Engineer, civil (consulting)nurse."  I told her she would have to sign out against medical advice if she wanted to leave, she said "that's what I will do then."  I told her I would page the doctor to make her aware.

## 2016-11-19 NOTE — Progress Notes (Signed)
Patient ID: [name], female   DOB: May 31, 1996, 20 y.o.   MRN: [id number] The staff is reporting recurrent seizures. She has had about 3 episodes in the last 15 minutes. Ativan 2 mg IVPx1 dose ordered.   Sanda Kleinavid Ortiz, MD

## 2016-11-19 NOTE — Progress Notes (Signed)
Called to room by boyfriend who says patient is having a seizure.  Patient noted to have generalized small jerking that lasted less then 30 seconds, Dr. Elisabeth Pigeonevine in room.

## 2016-11-19 NOTE — Progress Notes (Signed)
Patient still wants to leave AMA.  [facility] and explained the risks of leaving.  She stated "I don't want to have a seizure and possibly die, so I guess I will stay.  Boyfriend at the bedside.  Dr. Selena BattenKim was notified that patient wanted to be discharged and he informed me that she would have to sign the [facility] papers.

## 2016-11-19 NOTE — Progress Notes (Signed)
Called to room by boyfriend due to patient having a seizure.  Patient noted to be having small amount of generalized jerking that lasted about 30 seconds.  This occurred 2 more times in the span of 15 minutes.  Dr. Robb Matarrtiz notified via phone and new orders were given and carried out.  Patient in bed with boyfriend at this time and is alert and oriented x4, seizure pads remain on bed and oxygen and suction remain set up at bedside for seizure precautions.  Nursing staff to continue to monitor.

## 2016-11-19 NOTE — Progress Notes (Signed)
Pt complained of an allergic reaction to nutmeg in donuts her boyfriend got from the vending machine.  No obvious rash or hives noted and patient denies itching.  She reports having trouble breathing.  Dr. Selena BattenKim notified via phone and new orders were given and carried out.  Pt in bed now and reports symptoms are resolved.

## 2016-11-20 LAB — BASIC METABOLIC PANEL
Anion gap: 6 (ref 5–15)
BUN: 8 mg/dL (ref 6–20)
CO2: 25 mmol/L (ref 22–32)
Calcium: 9.1 mg/dL (ref 8.9–10.3)
Chloride: 104 mmol/L (ref 101–111)
Creatinine, Ser: 0.61 mg/dL (ref 0.44–1.00)
GFR calc Af Amer: >60 mL/min (ref >60)
GFR calc non Af Amer: >60 mL/min (ref >60)
Glucose, Bld: 90 mg/dL (ref 65–99)
Potassium: 3.6 mmol/L (ref 3.5–5.1)
Sodium: 135 mmol/L (ref 135–145)

## 2016-11-20 LAB — CBC
HCT: 41.4 % (ref 36.0–46.0)
Hemoglobin: 13.5 g/dL (ref 12.0–15.0)
MCH: 30.3 pg (ref 26.0–34.0)
MCHC: 32.6 g/dL (ref 30.0–36.0)
MCV: 93 fL (ref 78.0–100.0)
Platelets: 238 10*3/uL (ref 150–400)
RBC: 4.45 MIL/uL (ref 3.87–5.11)
RDW: 11.8 % (ref 11.5–15.5)
WBC: 13.1 10*3/uL — ABNORMAL HIGH (ref 4.0–10.5)

## 2016-11-20 MED ORDER — PRENATAL MULTIVITAMIN CH: 1.0000 | ORAL_TABLET | Freq: Every day | ORAL | 3 refills | Status: DC

## 2016-11-20 MED ORDER — DIPHENHYDRAMINE HCL 25 MG PO CAPS
25.0000 mg | ORAL_CAPSULE | Freq: Four times a day (QID) | ORAL | Status: DC | PRN
  Filled 2016-11-20: qty 1

## 2016-11-20 MED ORDER — FOLIC ACID 1 MG PO TABS: 2.0000 mg | ORAL_TABLET | Freq: Every day | ORAL | 3 refills | Status: DC

## 2016-11-20 MED ORDER — LEVETIRACETAM 1000 MG PO TABS: 1000.0000 mg | ORAL_TABLET | Freq: Two times a day (BID) | ORAL | 0 refills | Status: DC

## 2016-11-20 NOTE — Progress Notes (Signed)
Pt rested well throughout the night with no seizure activity. Pt has allergy to latex and her IV site had tape containing latex, which caused an allergic reaction. The area on her arm where the tape was turned red and became very itchy.Soon she was saying that her whole body was itching. Administered Benadryl PO to pt which helped relieve the itching. I also removed the tape and cleansed the site and re-taped the site with paper tape. She is now resting comfortably in her room with her fiance at the bedside. Report given to day shift nurse.  Erin Krueger, BSN, RN 11/20/2016 8:00 AM

## 2016-11-20 NOTE — Discharge Summary (Signed)
Physician Discharge Summary  BremondWillow Johnson ZOX:[id number] DOB: [birth year] DOA: 11/17/2016  PCP: No PCP Per Patient  Admit date: 11/17/2016 Discharge date: 11/20/2016  Recommendations for Outpatient Follow-up:  Continue Keppra 1000 milligrams twice daily, folic acid 2 mg once a day and prenatal vitamins. Patient instructed by neurology about no driving.   Discharge Diagnoses:  Principal Problem:   Pseudoseizures Active Problems:   PTSD (post-traumatic stress disorder)   ADHD (attention deficit hyperactivity disorder), combined type   Polysubstance abuse   Conduct disorder, adolescent-onset type   Pregnancy    Discharge Condition: stable   Diet recommendation: as tolerated   History of present illness:  20 y.o.femalewith medical history significant for ADHD, allergy, anxiety, asthma, eating disorder, oppositional defined disorder, PTSD, headaches. Patient presented to ED after having several seizures at home and during EMS transportation. Apparently, she is [redacted] weeks pregnant and was recently taken off of Keppra by her OB doctor. She was given IV Ativan by EMS and per EMS she has had several witnessed seizures in route to hospital. She was given total of 6 mg of Ativan and loaded with 1000 mg of Keppra TRH has discussed the case with Dr. Noel Christmasharles Stewart of neurology who did not feel there is a need for [facility] at this time. She has been seen by neurology consultation who recommended resuming Keppra along with high-dose folic acid considering patient's pregnancy. EEG was done and showed possibly partial seizures.  Hospital Course:    Assessment & Plan:   Principal Problem:   Seizure disorder - Patient seen by neurology, recommended resuming Keppra along with high-dose folic acid considering patient's pregnancy - EEG concerning for partial seizures - Patient is currently on Keppra 1000 milligrams twice daily  Active Problems:   PTSD (post-traumatic stress  disorder) / ADHD (attention deficit hyperactivity disorder), combined type / Polysubstance abuse / Conduct disorder, adolescent-onset type - Counseled on substance abuse - UDS positive for THC and benzos     Pregnancy - Beta hCG is in 64 K range  - Folic acid 2 mg daily along with prenatal vitamin   DVT prophylaxis:  SCDs bilaterally Code Status: full code  Family Communication:  fianc at the bedside     Consultants:   Neurology   Procedures:   EEG 11/18/2016 - partial onset seizures   Antimicrobials:   None    Signed:  Manson PasseyEVINE, Petrea Fredenburg, MD  [facility] 11/20/2016, 11:24 AM  Pager #: [phone]  Time spent in minutes: less than 30 minutes    Discharge Exam: Vitals:   11/19/16 2117 11/20/16 0550  BP: (!) 105/51 (!) 90/53  Pulse: 72 65  Resp: 18 20  Temp: 98.9 F (37.2 C) 97.4 F (36.3 C)   Vitals:   11/19/16 0500 11/19/16 1323 11/19/16 2117 11/20/16 0550  BP: 129/68 (!) 88/42 (!) 105/51 (!) 90/53  Pulse: 78 74 72 65  Resp: 19 20 18 20   Temp: 97.4 F (36.3 C) 98.4 F (36.9 C) 98.9 F (37.2 C) 97.4 F (36.3 C)  TempSrc: Oral Oral Oral Oral  SpO2: 100% 100% 100% 99%  Weight:      Height:        General: Pt is alert, follows commands appropriately, not in acute distress Cardiovascular: Regular rate and rhythm, S1/S2 + Respiratory: Clear to auscultation bilaterally, no wheezing, no crackles, no rhonchi Abdominal: Soft, non tender, non distended, bowel sounds +, no guarding Extremities: no edema, no cyanosis, pulses palpable bilaterally DP and PT Neuro: Grossly  nonfocal  Discharge Instructions  Discharge Instructions    Call MD for:  persistant nausea and vomiting    Complete by:  As directed    Call MD for:  redness, tenderness, or signs of infection (pain, swelling, redness, odor or green/yellow discharge around incision site)    Complete by:  As directed    Call MD for:  severe uncontrolled pain    Complete by:  As directed     Diet - low sodium heart healthy    Complete by:  As directed    Discharge instructions    Complete by:  As directed    Continue Keppra 1000 milligrams twice daily, folic acid 2 mg once a day and prenatal vitamins.   Increase activity slowly    Complete by:  As directed      Allergies as of 11/20/2016      Reactions   Bee Venom Anaphylaxis   Cocoa Butter    Prednisone Other (See Comments)   Bleeding nose bleeding and internal bleeding   Tramadol Other (See Comments)   Latex Rash      Medication List    STOP taking these medications   albuterol 108 (90 Base) MCG/ACT inhaler Commonly known as:  PROVENTIL HFA;VENTOLIN HFA   beclomethasone 80 MCG/ACT inhaler Commonly known as:  QVAR   HYDROcodone-acetaminophen 5-325 MG tablet Commonly known as:  NORCO/VICODIN   naproxen 500 MG tablet Commonly known as:  NAPROSYN   norgestimate-ethinyl estradiol 0.25-35 MG-MCG tablet Commonly known as:  ORTHO-CYCLEN,SPRINTEC,PREVIFEM     TAKE these medications   EPINEPHrine 0.3 mg/0.3 mL Soaj injection Commonly known as:  EPI-PEN Inject 0.3 mg into the muscle as needed (Bee Stings).   folic acid 1 MG tablet Commonly known as:  FOLVITE Take 2 tablets (2 mg total) by mouth daily. Start taking on:  11/21/2016   levETIRAcetam 1000 MG tablet Commonly known as:  KEPPRA Take 1 tablet (1,000 mg total) by mouth 2 (two) times daily. What changed:  medication strength  how much to take   prenatal multivitamin Tabs tablet Take 1 tablet by mouth daily at 12 noon.         The results of significant diagnostics from this hospitalization (including imaging, microbiology, ancillary and laboratory) are listed below for reference.    Significant Diagnostic Studies: No results found.  Microbiology: No results found for this or any previous visit (from the past 240 hour(s)).   Labs: Basic Metabolic Panel:  Recent Labs Lab 11/17/16 2124 11/17/16 2155 11/18/16 0609 11/20/16 0543   NA 135  --  135 135  K 3.5  --  3.5 3.6  CL 107  --  107 104  CO2 21*  --  22 25  GLUCOSE 92  --  91 90  BUN 10  --  7 8  CREATININE 0.61  --  0.50 0.61  CALCIUM 9.0  --  8.6* 9.1  MG  --  1.9  --   --   PHOS  --  3.7  --   --    Liver Function Tests:  Recent Labs Lab 11/17/16 2155 11/18/16 0609  AST 13* 11*  ALT 12* 12*  ALKPHOS 56 50  BILITOT 0.4 0.5  PROT 6.7 6.0*  ALBUMIN 4.0 3.5   No results for input(s): LIPASE, AMYLASE in the last 168 hours. No results for input(s): AMMONIA in the last 168 hours. CBC:  Recent Labs Lab 11/17/16 2124 11/18/16 0609 11/20/16 0543  WBC 14.2* 12.6* 13.1*  NEUTROABS 9.1* 8.0*  --   HGB 13.9 13.1 13.5  HCT 40.7 37.5 41.4  MCV 91.7 92.6 93.0  PLT 226 215 238   Cardiac Enzymes: No results for input(s): CKTOTAL, CKMB, CKMBINDEX, TROPONINI in the last 168 hours. BNP: BNP (last 3 results) No results for input(s): BNP in the last 8760 hours.  ProBNP (last 3 results) No results for input(s): PROBNP in the last 8760 hours.  CBG:  Recent Labs Lab 11/19/16 1409  GLUCAP 89

## 2016-11-20 NOTE — Progress Notes (Signed)
Pt is concerned about the well-being of her baby and would like the doctor to check on the baby this morning during rounds.

## 2016-11-20 NOTE — Discharge Instructions (Signed)
    Epilepsy Epilepsy is when a person keeps having seizures. A seizure is unusual activity in the brain. A seizure can change how you think or behave, and it can make it hard to be aware of what is happening. This condition can cause problems, such as:  Falls, accidents, and injury.  Depression.  Poor memory.  Sudden unexplained death in epilepsy (SUDEP). This is rare. Its cause is not known. Most people with epilepsy lead normal lives. Follow these instructions at home: Medicines  Take medicines only as told by your doctor.  Avoid anything that may keep your medicine from working, such as alcohol. Activity  Get enough rest. Lack of sleep can make seizures more likely to occur.  Follow your doctor's advice about driving, swimming, and doing anything else that would be dangerous if you had a seizure. Teaching others  Teach friends and family what to do if you have a seizure. They should:  Lay you on the ground to prevent a fall.  Cushion your head and body.  Loosen any tight clothing around your neck.  Turn you on your side.  Stay with you until you are better.  Not hold you down.  Not put anything in your mouth.  Know whether or not you need emergency care. General instructions  Avoid anything that causes you to have seizures.  Keep a seizure diary. Write down what you remember about each seizure, and especially what might have caused it.  Keep all follow-up visits as told by your doctor. This is important. Contact a doctor if:  You have a change in your seizure pattern.  You get an infection or start to feel sick. You may have more seizures when you are sick. Get help right away if:  A seizure does not stop after 5 minutes.  You have more than one seizure in a row, and you do not have enough time between the seizures to feel better.  A seizure makes it harder to breathe.  A seizure is different from other seizures you have had.  A seizure makes you  unable to speak or use a part of your body.  You did not wake up right after a seizure. This information is not intended to replace advice given to you by your health care provider. Make sure you discuss any questions you have with your health care provider. Document Released: 09/06/2009 Document Revised: 06/15/2016 Document Reviewed: 05/19/2016 Elsevier Interactive Patient Education  2017 [employer]vier Inc.  

## 2016-11-20 NOTE — ACP (Advance Care Planning) (Signed)
Checked with Ms [name] for the second day. She has been asleep both days. Her fiance stated she had decided she did not want to complete the forms at this time. Shared contact information if that changed.

## 2016-11-20 NOTE — Progress Notes (Signed)
Patient with orders to be discharge home. Discharge instructions given, patient verbalized understanding. Patient stable. Prescriptions given. Patient left in private vehicle with boyfriend and mom.

## 2017-01-26 DIAGNOSIS — F603 Borderline personality disorder: Secondary | ICD-10-CM | POA: Insufficient documentation

## 2017-05-22 ENCOUNTER — Emergency Department (HOSPITAL_COMMUNITY)
Admission: EM | Admit: 2017-05-22 | Discharge: 2017-05-22 | Disposition: A | Discharge status: Home / Self Care | Payer: Medicaid Other | Attending: Emergency Medicine | Admitting: Emergency Medicine

## 2017-05-22 ENCOUNTER — Encounter (HOSPITAL_COMMUNITY): Payer: Self-pay

## 2017-05-22 DIAGNOSIS — F17210 Nicotine dependence, cigarettes, uncomplicated: Secondary | ICD-10-CM | POA: Insufficient documentation

## 2017-05-22 DIAGNOSIS — R569 Unspecified convulsions: Secondary | ICD-10-CM

## 2017-05-22 DIAGNOSIS — J45909 Unspecified asthma, uncomplicated: Secondary | ICD-10-CM | POA: Diagnosis not present

## 2017-05-22 DIAGNOSIS — F909 Attention-deficit hyperactivity disorder, unspecified type: Secondary | ICD-10-CM | POA: Insufficient documentation

## 2017-05-22 DIAGNOSIS — F419 Anxiety disorder, unspecified: Secondary | ICD-10-CM | POA: Diagnosis not present

## 2017-05-22 DIAGNOSIS — Z79899 Other long term (current) drug therapy: Secondary | ICD-10-CM | POA: Insufficient documentation

## 2017-05-22 DIAGNOSIS — R300 Dysuria: Secondary | ICD-10-CM | POA: Diagnosis not present

## 2017-05-22 LAB — COMPREHENSIVE METABOLIC PANEL
ALT: 11 U/L — ABNORMAL LOW (ref 14–54)
AST: 17 U/L (ref 15–41)
Albumin: 2.9 g/dL — ABNORMAL LOW (ref 3.5–5.0)
Alkaline Phosphatase: 97 U/L (ref 38–126)
Anion gap: 9 (ref 5–15)
BUN: 6 mg/dL (ref 6–20)
CO2: 19 mmol/L — ABNORMAL LOW (ref 22–32)
Calcium: 8.5 mg/dL — ABNORMAL LOW (ref 8.9–10.3)
Chloride: 108 mmol/L (ref 101–111)
Creatinine, Ser: 0.69 mg/dL (ref 0.44–1.00)
GFR calc Af Amer: >60 mL/min (ref >60)
GFR calc non Af Amer: >60 mL/min (ref >60)
Glucose, Bld: 82 mg/dL (ref 65–99)
Potassium: 3.6 mmol/L (ref 3.5–5.1)
Sodium: 136 mmol/L (ref 135–145)
Total Bilirubin: 0.2 mg/dL — ABNORMAL LOW (ref 0.3–1.2)
Total Protein: 5.7 g/dL — ABNORMAL LOW (ref 6.5–8.1)

## 2017-05-22 LAB — CBC WITH DIFFERENTIAL/PLATELET
Basophils Absolute: 0 10*3/uL (ref 0.0–0.1)
Basophils Relative: 0 %
Eosinophils Absolute: 0.2 10*3/uL (ref 0.0–0.7)
Eosinophils Relative: 1 %
HCT: 33.9 % — ABNORMAL LOW (ref 36.0–46.0)
Hemoglobin: 11.4 g/dL — ABNORMAL LOW (ref 12.0–15.0)
Lymphocytes Relative: 17 %
Lymphs Abs: 2.8 10*3/uL (ref 0.7–4.0)
MCH: 31.1 pg (ref 26.0–34.0)
MCHC: 33.6 g/dL (ref 30.0–36.0)
MCV: 92.6 fL (ref 78.0–100.0)
Monocytes Absolute: 1 10*3/uL (ref 0.1–1.0)
Monocytes Relative: 6 %
Neutro Abs: 12.5 10*3/uL — ABNORMAL HIGH (ref 1.7–7.7)
Neutrophils Relative %: 76 %
Platelets: 185 10*3/uL (ref 150–400)
RBC: 3.66 MIL/uL — ABNORMAL LOW (ref 3.87–5.11)
RDW: 12.1 % (ref 11.5–15.5)
WBC: 16.5 10*3/uL — ABNORMAL HIGH (ref 4.0–10.5)

## 2017-05-22 LAB — MAGNESIUM: Magnesium: 1.6 mg/dL — ABNORMAL LOW (ref 1.7–2.4)

## 2017-05-22 LAB — CBG MONITORING, ED: Glucose-Capillary: 106 mg/dL — ABNORMAL HIGH (ref 65–99)

## 2017-05-22 LAB — LIPASE, BLOOD: Lipase: 29 U/L (ref 11–51)

## 2017-05-22 NOTE — Progress Notes (Signed)
Spoke with Dr. Shawnie PonsPratt. Reveiwed  Pt's hx with her. FHR tracing is a category 1 tracing with 1 uc. Cervix is closed, thk, posterior. Presenting part high. No vaginal bleeding or leaking of fluid. Blood pressures are nl. ED MD can consult neurology and the pt is OB cleared.

## 2017-05-22 NOTE — ED Notes (Signed)
Rapid OB nurse at bedside 

## 2017-05-22 NOTE — ED Notes (Signed)
Pt states that she wants to leave. EDP aware.

## 2017-05-22 NOTE — Progress Notes (Signed)
Pt is a G1P0 at [redacted] weeks gestation here because she had a seizure in the pool today. Family was with pt and said the lifeguard pulled the pt out of the pool. Her head did not go under water. Family member says that pt had 2 more seizures after she was removed from the pool. Pt was in and out of consiousness. Pt says she was diagnosed with epilepsy when she was 44six years old. Pt says she has been getting [place] in [place]. [place] [facility] [place] [facility] [place] [facility] [place] because she says her baby has a heart condition and may have to have surgery after delivery. Pt says that she was taking 1000mg  of kepra x2 daily that was decreased to 750mg  x2 daily. Says seizures have increased  Since the decrease in her Kepra. Says on Tues 05/18/2017 she was [facility] because she thought she felt a gush of fluid. Says they did not give her any information and sent her home.No vaginal bleeding or leaking of fluid noted at this time. Family member says pt has had a lot of UTI's this pregnancy. Pt is complaining of abd pain now.

## 2017-05-22 NOTE — ED Triage Notes (Signed)
Per EMS, Pt from [place] where she had 3 seizures back to back. Postical on scene but responsive to voice. Pt is [redacted] wks pregnant. Never been told she has pre-eclampsia. Pt states that she has not been receiving prenatal care recently due to a mix up in her paper work. Pt takes keppra and has been compliant. Pt states that her seizures have doubled since becoming pregnant. Pt also states that she has 8/10 stabbing abd pain and hasn't felt the baby move all day. Pt given 500 bolus of NS by EMS in route. Pt has no urge to push and no vaginal d/c. VSS. ALert and oriented x 4 upon arrival.

## 2017-05-22 NOTE — ED Provider Notes (Signed)
[facility]-EMERGENCY DEPT Provider Note   CSN: [id number] Arrival date & time: 05/22/17  1756     History   Chief Complaint Chief Complaint  Patient presents with  . Seizures    HPI Erin Krueger is a 21 y.o. female.  HPI  Patient is a 21 year old female, G2P0 at [redacted] weeks GA, with past medical history significant for seizure disorder, pseudoseizures, who presents to the emergency Department following a seizure. Patient was going down a water slide at a pool, then in the pool patient was had seizure-like activity. Per her fianc her head and never went underwater. Patient was pulled to the shore and then had another 2 seizure-like activities with eye deviation, generalized shaking. No urinary incontinence or tongue biting. Postictal following episodes. States it was similar to prior seizures. Recently decreased her Keppra dose from 1000 mg twice a day to 750 twice a day due to pregnancy. Endorses compliance with seizure medications.  Last seizure 2 weeks ago, significant increase in number of seizures since becoming pregnant.    Pt also states she felt feverish yesterday. Currently on treatment for a urinary tract infection with Mobic and Keflex. Continues to endorse dysuria with urinary urgency and frequency. Ongoing diffuse abdominal pain since Tuesday, crampy, intermittent. Denies nausea, vomiting, hematuria, back pain. No vaginal discharge, leakage of fluids, vaginal bleeding.  Past Medical History:  Diagnosis Date  . ADHD (attention deficit hyperactivity disorder)   . Allergy   . Anxiety   . Asthma   . Eating disorder   . Headache(784.0)   . ODD (oppositional defiant disorder)   . PTSD (post-traumatic stress disorder)   . Seizures [facility])     Patient Active Problem List   Diagnosis Date Noted  . 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 Pregnancy 11/17/2016  . PID (acute pelvic inflammatory disease) 01/29/2016  . Seizure disorder (HCC) 01/07/2016  . Seizure (HCC) 01/07/2016  . Conduct  disorder, adolescent-onset type 08/09/2013  . PTSD (post-traumatic stress disorder) 05/12/2013  . 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 Polysubstance abuse 05/12/2013    Past Surgical History:  Procedure Laterality Date  . TONSILLECTOMY      OB History    Gravida Para Term Preterm AB Living   1             SAB TAB Ectopic Multiple Live Births                   Home Medications    Prior to Admission medications   Medication Sig Start Date End Date Taking? Authorizing Provider  Albuterol Sulfate 108 (90 Base) MCG/ACT AEPB Inhale 2 puffs into the lungs every 6 (six) hours as needed. Or before activity   Yes [provider]  anti-nausea (EMETROL) solution Take 5 mLs by mouth every 6 (six) hours as needed for nausea or vomiting.   Yes [provider]  beclomethasone (QVAR) 40 MCG/ACT inhaler Inhale 2 puffs into the lungs daily.   Yes [provider]  folic acid (FOLVITE) 1 MG tablet Take 2 tablets (2 mg total) by mouth daily. 11/21/16  Yes Alison Murray, MD  levETIRAcetam (KEPPRA) 1000 MG tablet Take 1 tablet (1,000 mg total) by mouth 2 (two) times daily. Patient taking differently: Take 750 mg by mouth 2 (two) times daily.  11/20/16  Yes Alison Murray, MD  Prenatal Vit-Fe Fumarate-FA (PRENATAL MULTIVITAMIN) TABS tablet Take 1 tablet by mouth daily at 12 noon. 11/20/16  Yes Alison Murray, MD  EPINEPHrine 0.3 mg/0.3 mL IJ SOAJ injection Inject 0.3 mg into the muscle as needed (Bee Stings).    [provider]    Family History Family History  Problem Relation Age of Onset  . Diabetes Mother   . HIV Mother   . Diabetes Father   . HIV Father   . Diabetes Sister   . Diabetes Brother   . Breast cancer Maternal Grandmother     Social History Social History  Substance Use Topics  . Smoking status: Current Every Day Smoker    Packs/day: 0.50    Types: Cigarettes  . Smokeless tobacco: Never Used  .  Alcohol use Yes     Comment: 3-4 glassed vodka/beer(not while pregnant)     Allergies   Bee venom; Cocoa butter; Prednisone; Tramadol; and Latex   Review of Systems Review of Systems  Constitutional: Positive for chills. Negative for appetite change and fever.  HENT: Negative for congestion and trouble swallowing.   Eyes: Negative for visual disturbance.  Respiratory: Negative for cough, chest tightness and shortness of breath.   Cardiovascular: Negative for chest pain.  Gastrointestinal: Positive for abdominal pain. Negative for diarrhea, nausea and vomiting.  Genitourinary: Positive for dysuria, frequency and urgency. Negative for flank pain, hematuria, vaginal bleeding and vaginal discharge.  Musculoskeletal: Negative for back pain.  Skin: Negative for rash.  Neurological: Positive for seizures. Negative for dizziness, weakness and light-headedness.  Psychiatric/Behavioral: Negative for behavioral problems.     Physical Exam Updated Vital Signs BP 111/73   Pulse 91   Temp 97.6 F (36.4 C) (Oral)   Resp (!) 25   Ht 5\' 6"  (1.676 m)   Wt 81.6 kg (180 lb)   LMP 07/12/2016 (Exact Date) Comment: neg u preg  SpO2 100%   BMI 29.05 kg/m   Physical Exam  Constitutional: She is oriented to person, place, and time. She appears well-developed and well-nourished.  HENT:  Head: Atraumatic.  Mouth/Throat: Oropharynx is clear and moist.  Eyes: Conjunctivae and EOM are normal. Pupils are equal, round, and reactive to light.  Neck: Normal range of motion. Neck supple.  Cardiovascular: Normal rate and intact distal pulses.   Pulmonary/Chest: Effort normal.  Abdominal: She exhibits no distension and no mass. There is tenderness (mild diffuse TTP). There is no rebound and no guarding.  Negative McBurney's, negative Murphy's sign, no CVA tenderness.  Musculoskeletal: Normal range of motion. She exhibits no edema.  Neurological: She is alert and oriented to person, place, and time.    Skin: Skin is warm. Capillary refill takes less than 2 seconds. No pallor.  Psychiatric: She has a normal mood and affect.     ED Treatments / Results  Labs (all labs ordered are listed, but only abnormal results are displayed) Labs Reviewed  CBC WITH DIFFERENTIAL/PLATELET - Abnormal; Notable for the following:       Result Value   WBC 16.5 (*)    RBC 3.66 (*)    Hemoglobin 11.4 (*)    HCT 33.9 (*)    Neutro Abs 12.5 (*)    All other components within normal limits  COMPREHENSIVE METABOLIC PANEL - Abnormal; Notable for the following:    CO2 19 (*)    Calcium 8.5 (*)    Total Protein 5.7 (*)    Albumin 2.9 (*)    ALT 11 (*)    Total Bilirubin 0.2 (*)    All other components within normal limits  MAGNESIUM - Abnormal; Notable for the following:  Magnesium 1.6 (*)    All other components within normal limits  CBG MONITORING, ED - Abnormal; Notable for the following:    Glucose-Capillary 106 (*)    All other components within normal limits  LIPASE, BLOOD    EKG  EKG Interpretation None       Radiology No results found.  Procedures Procedures (including critical care time)  Medications Ordered in ED Medications - No data to display   Initial Impression / Assessment and Plan / ED Course  I have reviewed the triage vital signs and the nursing notes.  Pertinent labs & imaging results that were available during my care of the patient were reviewed by me and considered in my medical decision making (see chart for details).     Patient is a 21 year old female, G2P0 at [redacted] weeks GA, with past medical history significant for seizure disorder Who presents to the emergency Department following a witnessed seizure. Resolved with no treatment prior to arrival. Patient is back to her mental status baseline. GCS 15. Normal neurologic exam. Afebrile hemodynamically stable. Hypertension.  Patient most likely had a breakthrough seizure given that she has had a decreased dose  of her Keppra for being pregnant. Possibly breakthrough seizure from urinary tract infection. Patient is currently on antibiotics for UTI. Patient continues to have symptoms but refuses to wait in the emergency department to obtain a urine.  Lab work remarkable for leukocytosis of 16. Mag 1.6. K 3.6. Lipase 29. Leukocytosis could be secondary to having a seizure. Patient was monitored on TOCO, F RT with category 1 tracing. Cervix closed. No vaginal bleeding or leakage of fluid. Blood pressures have been normal in the emergency department, does not appear to be secondary to eclampsia. Obstetrics cleared the patient.  Had a prolonged discussion with the patient about the importance of staying in the emergency department for monitoring and to obtain a urine to make sure that she did not have ongoing urinary tract infection and treatment failure. Patient could have pyelonephritis in the setting of abdominal pain and dysuria. Discussed that an untreated infection could lead to injury to her baby, preterm labor, possibly death. Discussed untreated infection could also lead to worsening seizures, sepsis.  Pt refused to stay in the ED for a UA.  Discussed that she should follow up with her primary care physician on Monday. Discussed increasing keppra to normal dose of 1000 mg BID and following up with Neurology. Pt expressed understanding. Repeated the risk of leaving AMA.  Pt had full capacity.     Final Clinical Impressions(s) / ED Diagnoses   Final diagnoses:  Seizure [facility])  Dysuria  Hypomagnesemia    New Prescriptions Discharge Medication List as of 05/22/2017  7:56 PM       Corena Herter, MD 05/22/17 2356    Blane Ohara, MD 05/25/17 [id number]

## 2017-05-22 NOTE — ED Notes (Signed)
ED Provider at bedside. 

## 2017-09-12 DIAGNOSIS — J45909 Unspecified asthma, uncomplicated: Secondary | ICD-10-CM | POA: Diagnosis present

## 2017-10-25 IMAGING — US US OB COMP LESS 14 WK
1 series · 14 of 28 positions shown · non-contrast
Comparison: None.

CLINICAL DATA: Pregnant patient in first-trimester pregnancy with
left lower quadrant pain.

EXAM:
OBSTETRIC <14 WK US AND TRANSVAGINAL OB US
TECHNIQUE: Both transabdominal and transvaginal ultrasound examinations were
performed for complete evaluation of the gestation as well as the
maternal uterus, adnexal regions, and pelvic cul-de-sac.
Transvaginal technique was performed to assess early pregnancy.

[Series 1: us ob comp less 14 wk · 0.22mm/px · 14 of 74 slices shown]
[im 3/74]
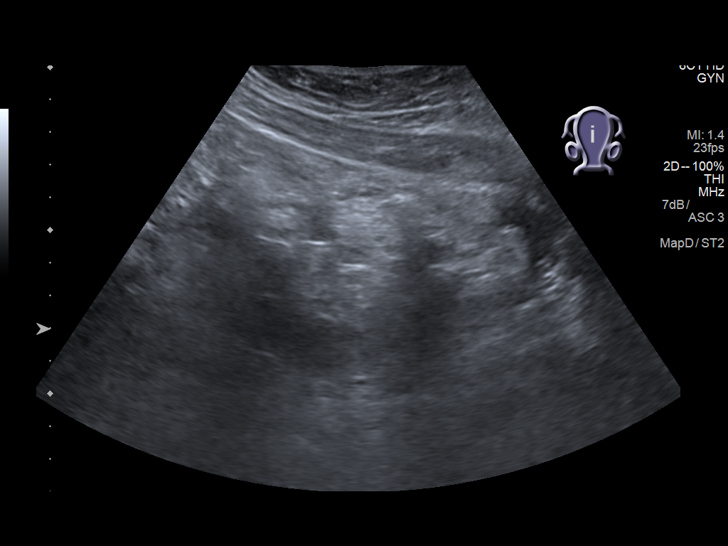
[im 9/74]
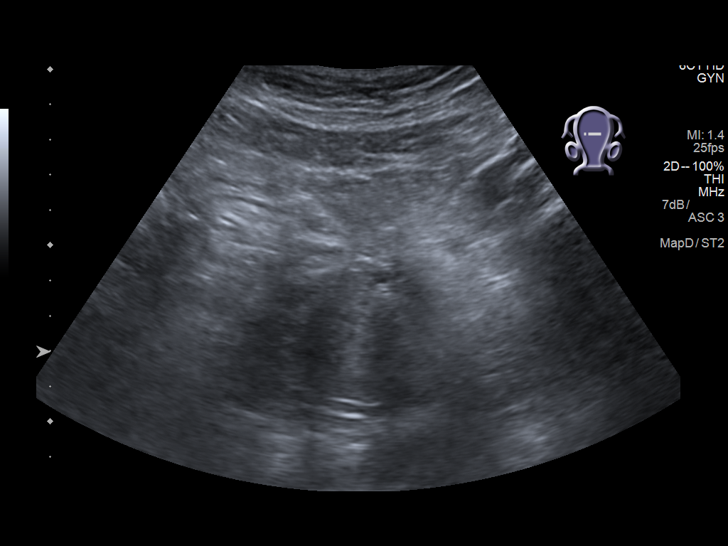
[im 14/74]
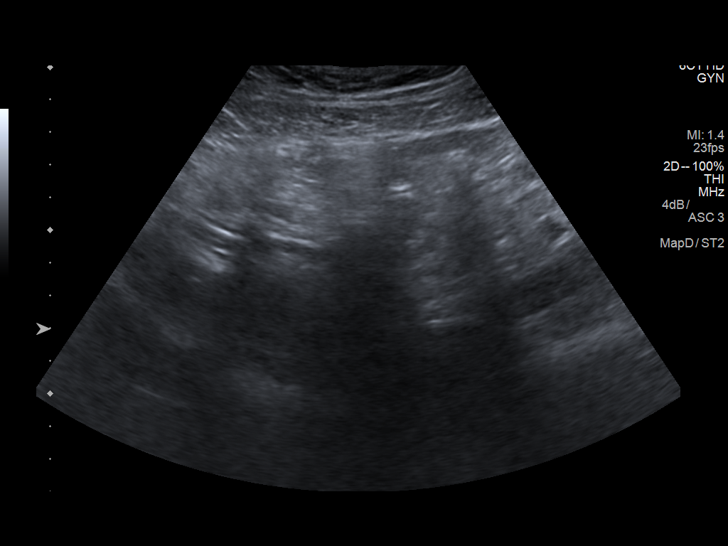
[im 19/74]
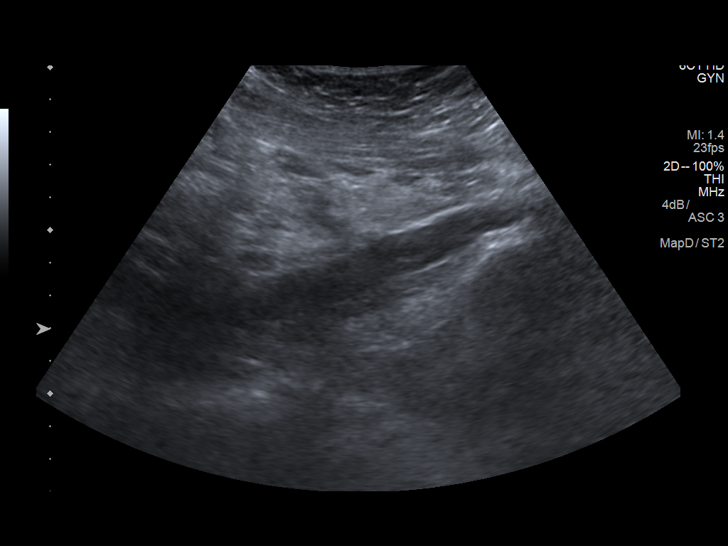
[im 25/74]
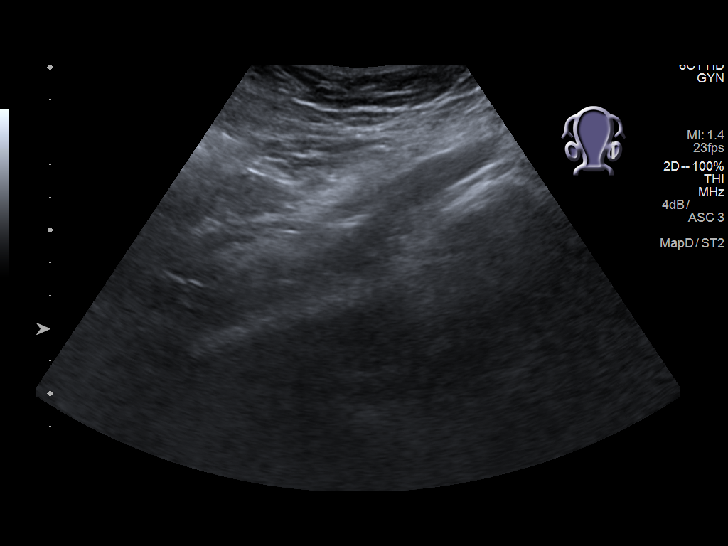
[im 30/74]
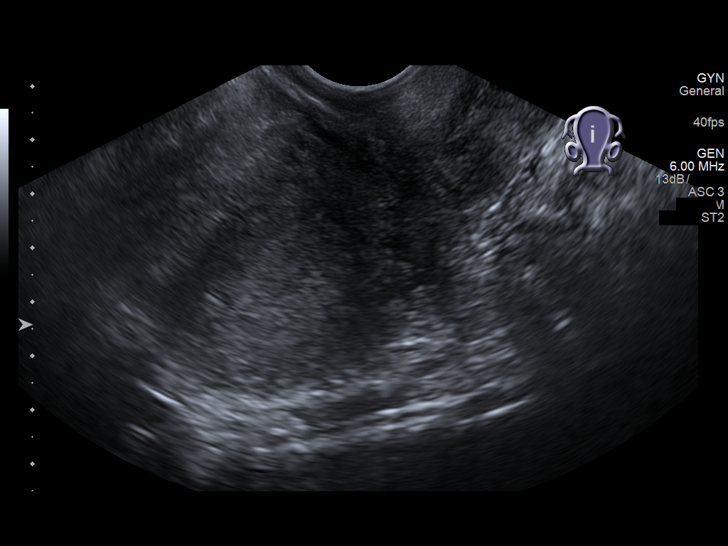
[im 36/74]
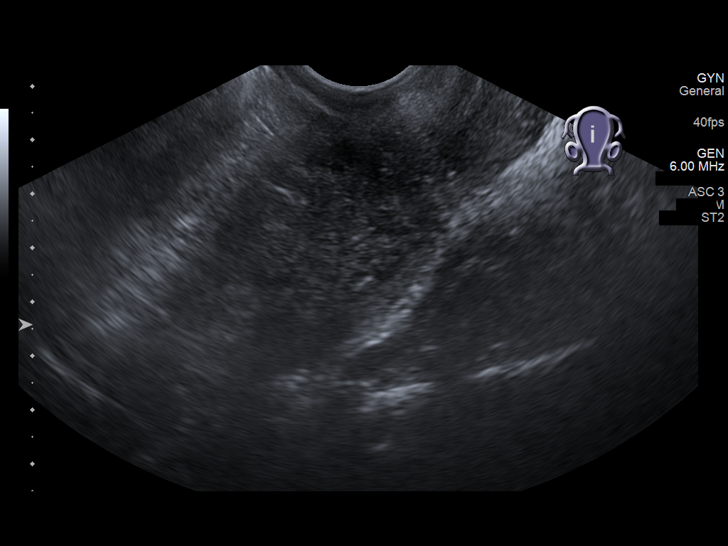
[im 41/74]
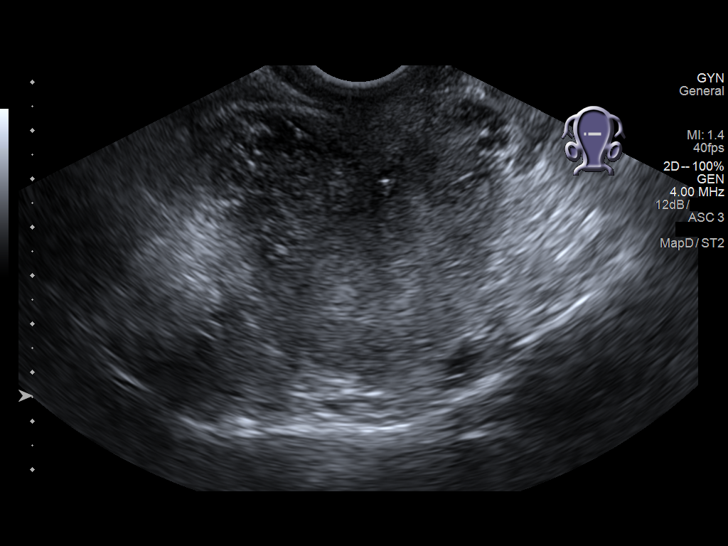
[im 46/74]
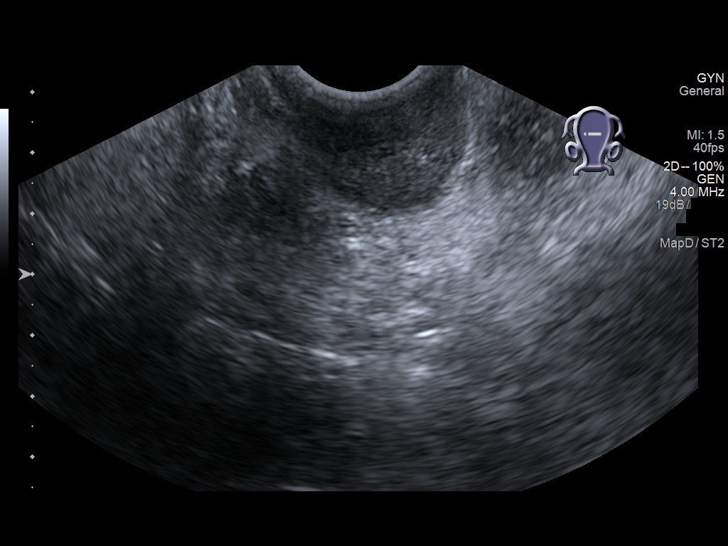
[im 52/74]
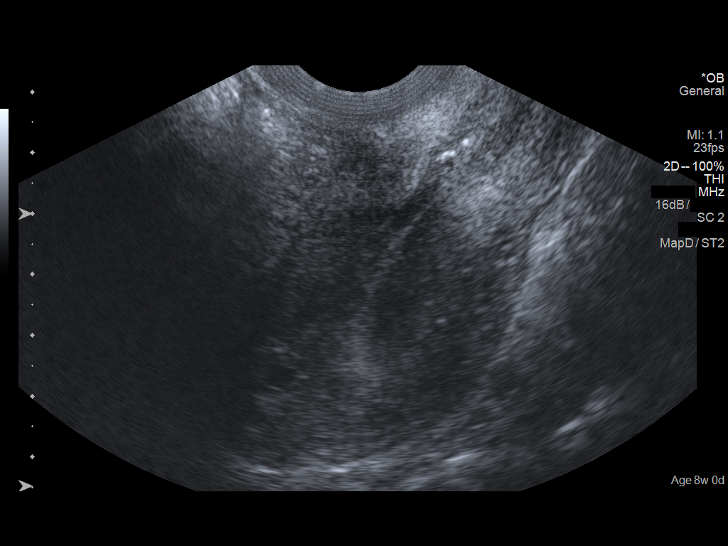
[im 57/74]
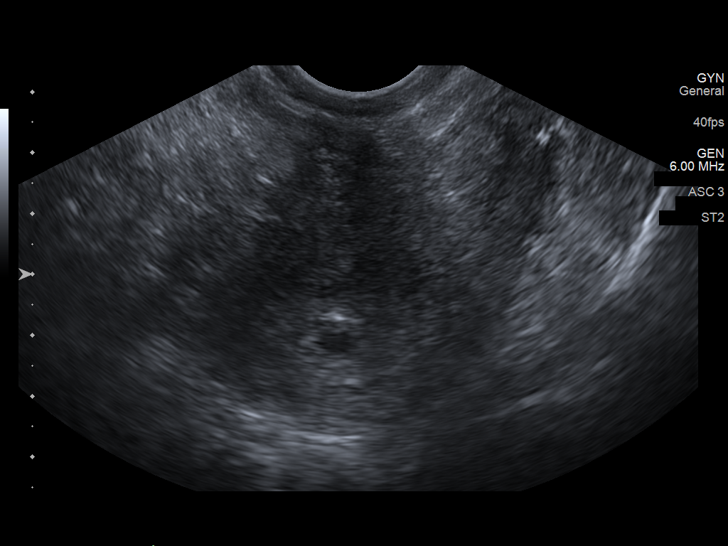
[im 63/74]
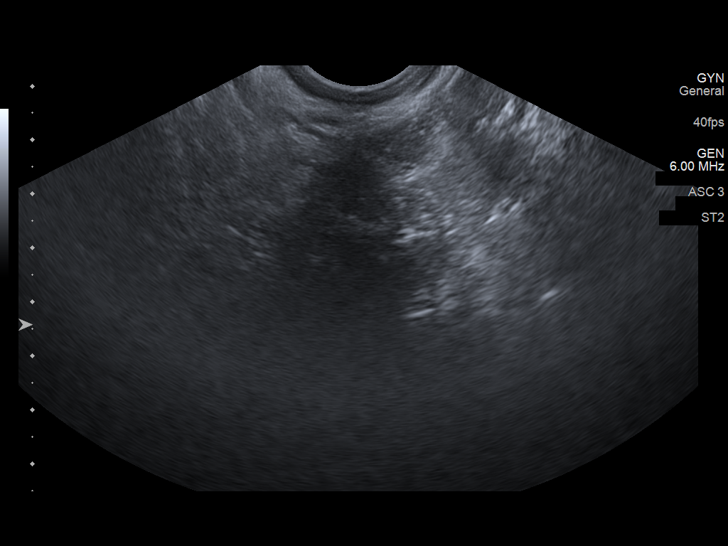
[im 68/74]
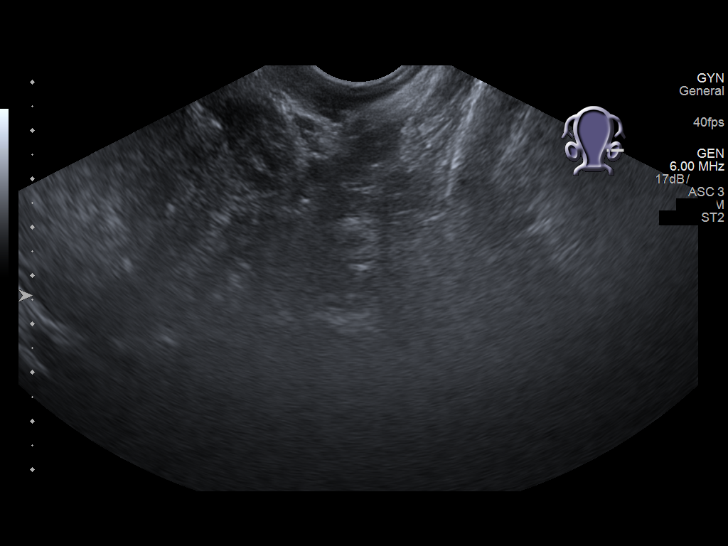
[im 74/74]
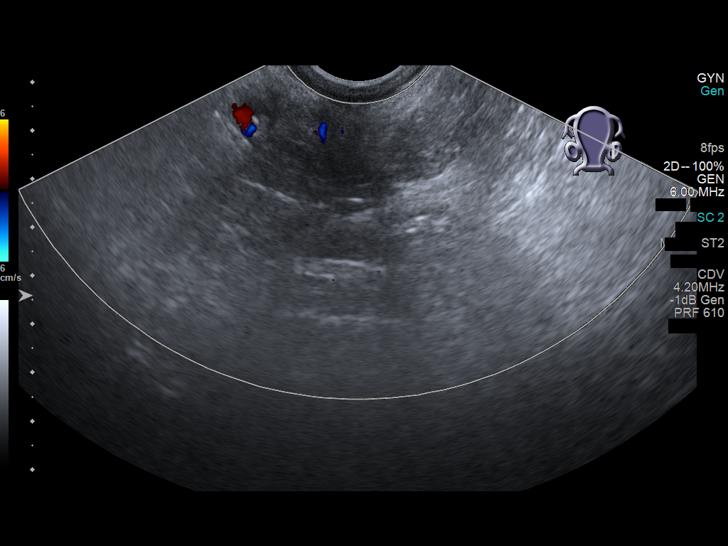

[14 of 28 positions shown; findings below may reference images not displayed]

FINDINGS: Intrauterine gestational sac: Single

Yolk sac:  Not Visualized.

Embryo:  Not Visualized.

Cardiac Activity: Not Visualized.

MSD: 5.4 mm   5 w   2 d

Subchorionic hemorrhage:  None visualized.

Maternal uterus/adnexae: Neither ovary is visualized due to bowel
gas. No adnexal mass. No pelvic free fluid.
IMPRESSION: Probable early intrauterine gestational sac, but no yolk sac, fetal
pole, or cardiac activity yet visualized. Recommend follow-up
quantitative B-HCG levels and follow-up US in 14 days to assess
viability. This recommendation follows SRU consensus guidelines:
Diagnostic Criteria for Nonviable Pregnancy Early in the First
Trimester. N Engl J Med [XO]; [DATE].

## 2017-11-23 NOTE — L&D Delivery Note (Signed)
Patient: Erin BertholdWillow Johnson MRN: [id number]  GBS status: Negative, IAP given: None   Patient is a 22 y.o. now G3P2 s/p NSVD at 2146w6d, who was admitted for IOL for seizure disorder. Found to have severe Pre-E antepartum and placed on Mg. Cervix dilated quickly from 4.5 to complete. BBOW noted on bed upon entrance to delivery room.  AROM 0h 2440m prior to delivery with clear fluid.    Delivery Note At 6:58 AM a viable female was delivered via Vaginal, Spontaneous (Presentation: LOA).  APGAR: 8, 9; weight pending.   Placenta status: spontaneous, intact.  Cord: 3 vessel with the following complications: none.    Anesthesia:  Epidural  Episiotomy: None Lacerations: None Suture Repair: None Est. Blood Loss (mL): 195  Head delivered LOA. No nuchal cord present. Shoulder and body delivered in usual fashion. Infant with spontaneous cry, placed on mother's abdomen, dried and bulb suctioned. Cord clamped x 2 after 1-minute delay, and cut by family member. Cord blood drawn. Placenta delivered spontaneously with gentle cord traction. Fundus firm with massage and Pitocin. Perineum inspected and found to have no lacerations.   Mom to postpartum.  Baby to Couplet care / Skin to Skin.  Delivery was performed by Trenda MootsElizabeth Barefoot, MS-3. I was present and gloved for the entire delivery.   De HollingsheadCatherine L Tanijah Morais 11/08/2018, 7:23 AM

## 2018-02-04 ENCOUNTER — Encounter (HOSPITAL_COMMUNITY): Payer: Self-pay | Admitting: Emergency Medicine

## 2018-02-04 ENCOUNTER — Other Ambulatory Visit: Payer: Self-pay

## 2018-02-04 ENCOUNTER — Emergency Department (HOSPITAL_COMMUNITY)
Admission: EM | Admit: 2018-02-04 | Discharge: 2018-02-05 | Disposition: A | Discharge status: Home / Self Care | Payer: Medicaid Other | Attending: Emergency Medicine | Admitting: Emergency Medicine

## 2018-02-04 DIAGNOSIS — F17210 Nicotine dependence, cigarettes, uncomplicated: Secondary | ICD-10-CM | POA: Diagnosis not present

## 2018-02-04 DIAGNOSIS — Z79899 Other long term (current) drug therapy: Secondary | ICD-10-CM | POA: Insufficient documentation

## 2018-02-04 DIAGNOSIS — J45909 Unspecified asthma, uncomplicated: Secondary | ICD-10-CM | POA: Diagnosis not present

## 2018-02-04 DIAGNOSIS — R569 Unspecified convulsions: Secondary | ICD-10-CM

## 2018-02-04 NOTE — ED Notes (Signed)
Pt given 2.5mg  Versed given IV in route by EMS

## 2018-02-04 NOTE — ED Triage Notes (Signed)
Pt has history of seizures. Has been off of seizure medications per PCP order. 3 witnessed seizures today lasting approximately 30-60 seconds each. Pt incontinent of urine and post ictal on arrival.

## 2018-02-05 LAB — CBG MONITORING, ED: Glucose-Capillary: 97 mg/dL (ref 65–99)

## 2018-02-05 MED ORDER — LEVETIRACETAM IN NACL 1000 MG/100ML IV SOLN
1000.0000 mg | Freq: Once | INTRAVENOUS | Status: AC
  Administered 2018-02-05: 1000 mg via INTRAVENOUS
  Filled 2018-02-05: qty 100

## 2018-02-05 MED ORDER — LEVETIRACETAM 750 MG PO TABS: 750.0000 mg | ORAL_TABLET | Freq: Two times a day (BID) | ORAL | 0 refills | Status: DC

## 2018-02-05 NOTE — ED Notes (Signed)
Patient verbalized understanding of discharge orders and follow up care instructions.

## 2018-02-05 NOTE — Discharge Instructions (Signed)
Restart your Keppra. DO NOT DRIVE UNTIL YOUR NEUROLOGIST STATES YOU CAN AGAIN!!!  Please call your neurologists office on Monday, March 18 to let them know you had another seizure and we restarted your Keppra.
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
## 2018-02-05 NOTE — ED Notes (Signed)
Pt complaining of a chest tightness & head hurting. EDP notified.

## 2018-03-04 ENCOUNTER — Emergency Department (HOSPITAL_COMMUNITY): Payer: Medicaid Other

## 2018-03-04 ENCOUNTER — Encounter (HOSPITAL_COMMUNITY): Payer: Self-pay | Admitting: *Deleted

## 2018-03-04 ENCOUNTER — Other Ambulatory Visit: Payer: Self-pay

## 2018-03-04 ENCOUNTER — Emergency Department (HOSPITAL_COMMUNITY)
Admission: EM | Admit: 2018-03-04 | Discharge: 2018-03-04 | Disposition: A | Discharge status: Home / Self Care | Payer: Medicaid Other | Attending: Emergency Medicine | Admitting: Emergency Medicine

## 2018-03-04 DIAGNOSIS — O99331 Smoking (tobacco) complicating pregnancy, first trimester: Secondary | ICD-10-CM | POA: Diagnosis not present

## 2018-03-04 DIAGNOSIS — Z3A01 Less than 8 weeks gestation of pregnancy: Secondary | ICD-10-CM | POA: Diagnosis not present

## 2018-03-04 DIAGNOSIS — Z91040 Latex allergy status: Secondary | ICD-10-CM | POA: Insufficient documentation

## 2018-03-04 DIAGNOSIS — Z79899 Other long term (current) drug therapy: Secondary | ICD-10-CM | POA: Insufficient documentation

## 2018-03-04 DIAGNOSIS — F17210 Nicotine dependence, cigarettes, uncomplicated: Secondary | ICD-10-CM | POA: Insufficient documentation

## 2018-03-04 DIAGNOSIS — O99519 Diseases of the respiratory system complicating pregnancy, unspecified trimester: Secondary | ICD-10-CM | POA: Insufficient documentation

## 2018-03-04 DIAGNOSIS — R102 Pelvic and perineal pain: Secondary | ICD-10-CM | POA: Diagnosis not present

## 2018-03-04 DIAGNOSIS — O2391 Unspecified genitourinary tract infection in pregnancy, first trimester: Secondary | ICD-10-CM | POA: Diagnosis not present

## 2018-03-04 DIAGNOSIS — O9989 Other specified diseases and conditions complicating pregnancy, childbirth and the puerperium: Secondary | ICD-10-CM | POA: Diagnosis present

## 2018-03-04 DIAGNOSIS — N3000 Acute cystitis without hematuria: Secondary | ICD-10-CM

## 2018-03-04 LAB — COMPREHENSIVE METABOLIC PANEL
ALT: 11 U/L — ABNORMAL LOW (ref 14–54)
AST: 12 U/L — ABNORMAL LOW (ref 15–41)
Albumin: 4 g/dL (ref 3.5–5.0)
Alkaline Phosphatase: 62 U/L (ref 38–126)
Anion gap: 11 (ref 5–15)
BUN: 11 mg/dL (ref 6–20)
CO2: 21 mmol/L — ABNORMAL LOW (ref 22–32)
Calcium: 9.1 mg/dL (ref 8.9–10.3)
Chloride: 105 mmol/L (ref 101–111)
Creatinine, Ser: 0.63 mg/dL (ref 0.44–1.00)
GFR calc Af Amer: >60 mL/min (ref >60)
GFR calc non Af Amer: >60 mL/min (ref >60)
Glucose, Bld: 91 mg/dL (ref 65–99)
Potassium: 3.5 mmol/L (ref 3.5–5.1)
Sodium: 137 mmol/L (ref 135–145)
Total Bilirubin: 0.3 mg/dL (ref 0.3–1.2)
Total Protein: 7.1 g/dL (ref 6.5–8.1)

## 2018-03-04 LAB — CBC WITH DIFFERENTIAL/PLATELET
Basophils Absolute: 0 10*3/uL (ref 0.0–0.1)
Basophils Relative: 0 %
Eosinophils Absolute: 0.2 10*3/uL (ref 0.0–0.7)
Eosinophils Relative: 2 %
HCT: 43 % (ref 36.0–46.0)
Hemoglobin: 14 g/dL (ref 12.0–15.0)
Lymphocytes Relative: 29 %
Lymphs Abs: 2.7 10*3/uL (ref 0.7–4.0)
MCH: 29.4 pg (ref 26.0–34.0)
MCHC: 32.6 g/dL (ref 30.0–36.0)
MCV: 90.3 fL (ref 78.0–100.0)
Monocytes Absolute: 0.6 10*3/uL (ref 0.1–1.0)
Monocytes Relative: 6 %
Neutro Abs: 5.8 10*3/uL (ref 1.7–7.7)
Neutrophils Relative %: 63 %
Platelets: 260 10*3/uL (ref 150–400)
RBC: 4.76 MIL/uL (ref 3.87–5.11)
RDW: 12.7 % (ref 11.5–15.5)
WBC: 9.3 10*3/uL (ref 4.0–10.5)

## 2018-03-04 LAB — URINALYSIS, ROUTINE W REFLEX MICROSCOPIC
Bilirubin Urine: NEGATIVE
Glucose, UA: NEGATIVE mg/dL
Hgb urine dipstick: NEGATIVE
Ketones, ur: 5 mg/dL — AB
Nitrite: NEGATIVE
Protein, ur: NEGATIVE mg/dL
Specific Gravity, Urine: 1.029 (ref 1.005–1.030)
pH: 5 (ref 5.0–8.0)

## 2018-03-04 LAB — HCG, QUANTITATIVE, PREGNANCY: hCG, Beta Chain, Quant, S: 4073 m[IU]/mL — ABNORMAL HIGH (ref <5)

## 2018-03-04 LAB — WET PREP, GENITAL
Clue Cells Wet Prep HPF POC: NONE SEEN
Sperm: NONE SEEN
Trich, Wet Prep: NONE SEEN
Yeast Wet Prep HPF POC: NONE SEEN

## 2018-03-04 LAB — LIPASE, BLOOD: Lipase: 34 U/L (ref 11–51)

## 2018-03-04 MED ORDER — PRENATAL MULTIVITAMIN CH: 1.0000 | ORAL_TABLET | Freq: Every day | ORAL | 3 refills | Status: DC

## 2018-03-04 MED ORDER — ONDANSETRON HCL 4 MG/2ML IJ SOLN
4.0000 mg | Freq: Once | INTRAMUSCULAR | Status: AC
  Administered 2018-03-04: 4 mg via INTRAVENOUS
  Filled 2018-03-04: qty 2

## 2018-03-04 MED ORDER — CEPHALEXIN 500 MG PO CAPS
500.0000 mg | ORAL_CAPSULE | Freq: Once | ORAL | Status: AC
Start: 2018-03-04 — End: 2018-03-04
  Administered 2018-03-04: 500 mg via ORAL
  Filled 2018-03-04: qty 1

## 2018-03-04 MED ORDER — CEPHALEXIN 500 MG PO CAPS: 500.0000 mg | ORAL_CAPSULE | Freq: Four times a day (QID) | ORAL | 0 refills | Status: DC

## 2018-03-04 MED ORDER — SODIUM CHLORIDE 0.9 % IV BOLUS
1000.0000 mL | Freq: Once | INTRAVENOUS | Status: AC
  Administered 2018-03-04: 1000 mL via INTRAVENOUS

## 2018-03-04 NOTE — ED Notes (Signed)
Pt to request to walk in hallway, thinks her pain in her abdomen is gas.  After 2 laps around ED nurses station she states she's feeling better.  Significant other walking with pt.

## 2018-03-04 NOTE — ED Notes (Signed)
Radiology notified at 4657 of order for transvaginal ultrasound, staff state they are in the process of calling in staff for exam.  Pt informed of order.

## 2018-03-04 NOTE — ED Triage Notes (Signed)
Pt reports lower abdominal pain, nausea, vomiting and low grade fever x 2 weeks. Pt reports she needs a pregnancy test. Pt took a home pregnancy test yesterday and it was positive.

## 2018-03-04 NOTE — ED Provider Notes (Signed)
[facility] EMERGENCY DEPARTMENT Provider Note   CSN: [id number] Arrival date & time: 03/04/18  1820     History   Chief Complaint Chief Complaint  Patient presents with  . Abdominal Pain    HPI Erin Krueger is a 22 y.o. female.  Pt presents to the ED today with llq abd pain.  Pt has had n/v and fever also.  Her last period was in February.  She took a home pregnancy test yesterday which was positive.  She is on birth control pills, but misses doses occasionally.  The pt denies bleeding.     Past Medical History:  Diagnosis Date  . ADHD (attention deficit hyperactivity disorder)   . Allergy   . Anxiety   . Asthma   . Eating disorder   . Headache(784.0)   . ODD (oppositional defiant disorder)   . PTSD (post-traumatic stress disorder)   . Seizures [facility])     Patient Active Problem List   Diagnosis Date Noted  . 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 Pregnancy 11/17/2016  . PID (acute pelvic inflammatory disease) 01/29/2016  . Seizure disorder (HCC) 01/07/2016  . Seizure (HCC) 01/07/2016  . 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 PTSD (post-traumatic stress disorder) 05/12/2013  . 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 Polysubstance abuse (HCC) 05/12/2013    Past Surgical History:  Procedure Laterality Date  . TONSILLECTOMY       OB History    Gravida  2   Para      Term      Preterm      AB      Living        SAB      TAB      Ectopic      Multiple      Live Births               Home Medications    Prior to Admission medications   Medication Sig Start Date End Date Taking? Authorizing Provider  beclomethasone (QVAR) 40 MCG/ACT inhaler Inhale 2 puffs into the lungs daily.   Yes [provider]  EPINEPHrine 0.3 mg/0.3 mL IJ SOAJ injection Inject 0.3 mg into the muscle as needed (Bee Stings).   Yes [provider]  folic acid (FOLVITE) 1 MG tablet Take 2 tablets (2 mg  total) by mouth daily. 11/21/16  Yes Alison Murray, MD  ibuprofen (ADVIL,MOTRIN) 800 MG tablet Take 800 mg by mouth every 6 (six) hours.   Yes [provider]  levETIRAcetam (KEPPRA) 750 MG tablet Take 1 tablet (750 mg total) by mouth 2 (two) times daily. 02/05/18  Yes Devoria Albe, MD  cephALEXin (KEFLEX) 500 MG capsule Take 1 capsule (500 mg total) by mouth 4 (four) times daily. 03/04/18   Jacalyn Lefevre, MD  Prenatal Vit-Fe Fumarate-FA (PRENATAL MULTIVITAMIN) TABS tablet Take 1 tablet by mouth daily at 12 noon. 03/04/18   Jacalyn Lefevre, MD    Family History Family History  Problem Relation Age of Onset  . Diabetes Mother   . HIV Mother   . Diabetes Father   . HIV Father   . Diabetes Sister   . Diabetes Brother   . Breast cancer Maternal Grandmother     Social History Social History   Tobacco Use  . Smoking status: Current Every Day Smoker    Packs/day: 0.50    Types: Cigarettes  . Smokeless tobacco: Never Used  Substance Use Topics  . Alcohol use:  Yes    Comment: 3-4 glassed vodka/beer(not while pregnant)  . Drug use: Yes    Types: Marijuana    Comment: K2(none while pregnant) cocaine last use 2018     Allergies   Bee venom; Cashew nut oil; Cocoa butter; Prednisone; Shellfish allergy; and Latex   Review of Systems Review of Systems  Gastrointestinal: Positive for abdominal pain.  All other systems reviewed and are negative.    Physical Exam Updated Vital Signs BP 135/84 (BP Location: Right Arm)   Pulse 78   Temp 97.8 F (36.6 C) (Oral)   Resp 16   Ht 5\' 7"  (1.702 m)   Wt 84.1 kg (185 lb 4.8 oz)   LMP 12/30/2017 (Within Days)   SpO2 95%   BMI 29.02 kg/m   Physical Exam  Constitutional: She is oriented to person, place, and time. She appears well-developed and well-nourished.  HENT:  Head: Normocephalic and atraumatic.  Mouth/Throat: Oropharynx is clear and moist.  Eyes: Pupils are equal, round, and reactive to light. EOM are normal.    Cardiovascular: Normal rate, regular rhythm, normal heart sounds and intact distal pulses.  Pulmonary/Chest: Effort normal and breath sounds normal.  Abdominal: Soft. Normal appearance and bowel sounds are normal. There is tenderness in the left lower quadrant.  Genitourinary: Vagina normal and uterus normal. Cervix exhibits discharge. Left adnexum displays tenderness.  Neurological: She is alert and oriented to person, place, and time.  Skin: Skin is warm and dry. Capillary refill takes less than 2 seconds.  Psychiatric: She has a normal mood and affect. Her behavior is normal.  Nursing note and vitals reviewed.    ED Treatments / Results  Labs (all labs ordered are listed, but only abnormal results are displayed) Labs Reviewed  WET PREP, GENITAL - Abnormal; Notable for the following components:      Result Value   WBC, Wet Prep HPF POC MANY (*)    All other components within normal limits  COMPREHENSIVE METABOLIC PANEL - Abnormal; Notable for the following components:   CO2 21 (*)    AST 12 (*)    ALT 11 (*)    All other components within normal limits  URINALYSIS, ROUTINE W REFLEX MICROSCOPIC - Abnormal; Notable for the following components:   APPearance HAZY (*)    Ketones, ur 5 (*)    Leukocytes, UA LARGE (*)    Bacteria, UA MANY (*)    Squamous Epithelial / LPF 6-30 (*)    All other components within normal limits  HCG, QUANTITATIVE, PREGNANCY - Abnormal; Notable for the following components:   hCG, Beta Chain, Quant, S 4,073 (*)    All other components within normal limits  URINE CULTURE  CBC WITH DIFFERENTIAL/PLATELET  LIPASE, BLOOD  GC/CHLAMYDIA PROBE AMP ([place]) NOT AT [facility]    EKG None  Radiology [place] Ob Comp < 14 Wks  Result Date: 03/04/2018 CLINICAL DATA:  Pregnant patient in first-trimester pregnancy with left lower quadrant pain. EXAM: OBSTETRIC <14 WK US AND TRANSVAGINAL OB US TECHNIQUE: Both transabdominal and transvaginal ultrasound examinations  were performed for complete evaluation of the gestation as well as the maternal uterus, adnexal regions, and pelvic cul-de-sac. Transvaginal technique was performed to assess early pregnancy. COMPARISON:  None. FINDINGS: Intrauterine gestational sac: Single Yolk sac:  Not Visualized. Embryo:  Not Visualized. Cardiac Activity: Not Visualized. MSD: 5.4 mm   5 w   2 d Subchorionic hemorrhage:  None visualized. Maternal uterus/adnexae: Neither ovary is visualized due to bowel gas.  No adnexal mass. No pelvic free fluid. IMPRESSION: Probable early intrauterine gestational sac, but no yolk sac, fetal pole, or cardiac activity yet visualized. Recommend follow-up quantitative B-HCG levels and follow-up US in 14 days to assess viability. This recommendation follows SRU consensus guidelines: Diagnostic Criteria for Nonviable Pregnancy Early in the First Trimester. Malva Limes Med 2013; 045:4098-11. Electronically Signed   By: Rubye Oaks M.D.   On: 03/04/2018 23:00   US Ob Transvaginal  Result Date: 03/04/2018 CLINICAL DATA:  Pregnant patient in first-trimester pregnancy with left lower quadrant pain. EXAM: OBSTETRIC <14 WK [place] AND TRANSVAGINAL OB US TECHNIQUE: Both transabdominal and transvaginal ultrasound examinations were performed for complete evaluation of the gestation as well as the maternal uterus, adnexal regions, and pelvic cul-de-sac. Transvaginal technique was performed to assess early pregnancy. COMPARISON:  None. FINDINGS: Intrauterine gestational sac: Single Yolk sac:  Not Visualized. Embryo:  Not Visualized. Cardiac Activity: Not Visualized. MSD: 5.4 mm   5 w   2 d Subchorionic hemorrhage:  None visualized. Maternal uterus/adnexae: Neither ovary is visualized due to bowel gas. No adnexal mass. No pelvic free fluid. IMPRESSION: Probable early intrauterine gestational sac, but no yolk sac, fetal pole, or cardiac activity yet visualized. Recommend follow-up quantitative B-HCG levels and follow-up US in 14  days to assess viability. This recommendation follows SRU consensus guidelines: Diagnostic Criteria for Nonviable Pregnancy Early in the First Trimester. Malva Limes Med 2013; 914:7829-56. Electronically Signed   By: Rubye Oaks M.D.   On: 03/04/2018 23:00    Procedures Procedures (including critical care time)  Medications Ordered in ED Medications  ondansetron (ZOFRAN) injection 4 mg (4 mg Intravenous Given 03/04/18 1936)  sodium chloride 0.9 % bolus 1,000 mL (0 mLs Intravenous Stopped 03/04/18 2041)  cephALEXin (KEFLEX) capsule 500 mg (500 mg Oral Given 03/04/18 2134)     Initial Impression / Assessment and Plan / ED Course  I have reviewed the triage vital signs and the nursing notes.  Pertinent labs & imaging results that were available during my care of the patient were reviewed by me and considered in my medical decision making (see chart for details).     US showed early gestational sac, no yolk sac or HR.  The pt is encouraged to get repeat quant and [place] in 2 weeks.  Pt instructed to return for increased pain or for vaginal bleeding.  F/u with ob.  Final Clinical Impressions(s) / ED Diagnoses   Final diagnoses:  Acute cystitis without hematuria  Less than [redacted] weeks gestation of pregnancy    ED Discharge Orders        Ordered    Prenatal Vit-Fe Fumarate-FA (PRENATAL MULTIVITAMIN) TABS tablet  Daily     03/04/18 2311    cephALEXin (KEFLEX) 500 MG capsule  4 times daily     03/04/18 2311       Jacalyn Lefevre, MD 03/04/18 2323

## 2018-03-04 NOTE — Discharge Instructions (Signed)
You will need to get hormone level and repeat ultrasound in 2 weeks to reassess pregnancy.  Return for increased pain or vaginal bleeding.

## 2018-03-04 NOTE — ED Notes (Signed)
Pt with complaints of nausea.  IV Zofran given, per order.

## 2018-03-07 LAB — GC/CHLAMYDIA PROBE AMP (~~LOC~~) NOT AT ARMC
Chlamydia: NEGATIVE
Neisseria Gonorrhea: NEGATIVE

## 2018-03-07 LAB — URINE CULTURE: Culture: >=100000 — AB

## 2018-03-08 ENCOUNTER — Telehealth: Payer: Self-pay | Admitting: *Deleted

## 2018-03-08 NOTE — Telephone Encounter (Signed)
Post ED Visit - Positive Culture Follow-up  Culture report reviewed by antimicrobial stewardship pharmacist:  []  Erin Krueger, Pharm.D. []  Erin Krueger, Pharm.D., BCPS AQ-ID []  Garvin FilaMike Maccia, Pharm.D., BCPS []  Georgina PillionElizabeth Martin, Pharm.D., BCPS []  [place] Prairie[place]h Pham, VermontPharm.D., BCPS, AAHIVP [x]  Estella HuskMichelle Turner, Pharm.D., BCPS, AAHIVP []  Lysle Pearlachel Rumbarger, PharmD, BCPS []  Blake DivineShannon Parkey, PharmD []  Pollyann SamplesAndy Johnston, PharmD, BCPS  Positive urine culture Treated with Cephalexin, organism sensitive to the same and no further patient follow-up is required at this time.  Virl AxeRobertson, Kellen [facility] 03/08/2018, 10:03 AM

## 2018-03-15 ENCOUNTER — Other Ambulatory Visit: Payer: Self-pay

## 2018-03-15 ENCOUNTER — Encounter (HOSPITAL_COMMUNITY): Payer: Self-pay | Admitting: Emergency Medicine

## 2018-03-15 ENCOUNTER — Emergency Department (HOSPITAL_COMMUNITY)
Admission: EM | Admit: 2018-03-15 | Discharge: 2018-03-15 | Disposition: A | Discharge status: Home / Self Care | Payer: Medicaid Other | Attending: Emergency Medicine | Admitting: Emergency Medicine

## 2018-03-15 DIAGNOSIS — Z91040 Latex allergy status: Secondary | ICD-10-CM | POA: Insufficient documentation

## 2018-03-15 DIAGNOSIS — Z3A01 Less than 8 weeks gestation of pregnancy: Secondary | ICD-10-CM | POA: Insufficient documentation

## 2018-03-15 DIAGNOSIS — G40909 Epilepsy, unspecified, not intractable, without status epilepticus: Secondary | ICD-10-CM | POA: Insufficient documentation

## 2018-03-15 DIAGNOSIS — O9989 Other specified diseases and conditions complicating pregnancy, childbirth and the puerperium: Secondary | ICD-10-CM | POA: Insufficient documentation

## 2018-03-15 DIAGNOSIS — Z79899 Other long term (current) drug therapy: Secondary | ICD-10-CM | POA: Diagnosis not present

## 2018-03-15 DIAGNOSIS — F17210 Nicotine dependence, cigarettes, uncomplicated: Secondary | ICD-10-CM | POA: Diagnosis not present

## 2018-03-15 DIAGNOSIS — R51 Headache: Secondary | ICD-10-CM | POA: Diagnosis not present

## 2018-03-15 DIAGNOSIS — J45909 Unspecified asthma, uncomplicated: Secondary | ICD-10-CM | POA: Diagnosis not present

## 2018-03-15 DIAGNOSIS — R569 Unspecified convulsions: Secondary | ICD-10-CM | POA: Diagnosis not present

## 2018-03-15 LAB — COMPREHENSIVE METABOLIC PANEL
ALT: 13 U/L — ABNORMAL LOW (ref 14–54)
AST: 12 U/L — ABNORMAL LOW (ref 15–41)
Albumin: 3.9 g/dL (ref 3.5–5.0)
Alkaline Phosphatase: 62 U/L (ref 38–126)
Anion gap: 8 (ref 5–15)
BUN: 8 mg/dL (ref 6–20)
CO2: 22 mmol/L (ref 22–32)
Calcium: 8.8 mg/dL — ABNORMAL LOW (ref 8.9–10.3)
Chloride: 105 mmol/L (ref 101–111)
Creatinine, Ser: 0.56 mg/dL (ref 0.44–1.00)
GFR calc Af Amer: >60 mL/min (ref >60)
GFR calc non Af Amer: >60 mL/min (ref >60)
Glucose, Bld: 97 mg/dL (ref 65–99)
Potassium: 3.5 mmol/L (ref 3.5–5.1)
Sodium: 135 mmol/L (ref 135–145)
Total Bilirubin: 0.3 mg/dL (ref 0.3–1.2)
Total Protein: 6.9 g/dL (ref 6.5–8.1)

## 2018-03-15 LAB — CBC WITH DIFFERENTIAL/PLATELET
Basophils Absolute: 0 10*3/uL (ref 0.0–0.1)
Basophils Relative: 0 %
Eosinophils Absolute: 0.2 10*3/uL (ref 0.0–0.7)
Eosinophils Relative: 1 %
HCT: 40.6 % (ref 36.0–46.0)
Hemoglobin: 13.4 g/dL (ref 12.0–15.0)
Lymphocytes Relative: 26 %
Lymphs Abs: 3 10*3/uL (ref 0.7–4.0)
MCH: 29.6 pg (ref 26.0–34.0)
MCHC: 33 g/dL (ref 30.0–36.0)
MCV: 89.8 fL (ref 78.0–100.0)
Monocytes Absolute: 1 10*3/uL (ref 0.1–1.0)
Monocytes Relative: 8 %
Neutro Abs: 7.4 10*3/uL (ref 1.7–7.7)
Neutrophils Relative %: 65 %
Platelets: 233 10*3/uL (ref 150–400)
RBC: 4.52 MIL/uL (ref 3.87–5.11)
RDW: 12.5 % (ref 11.5–15.5)
WBC: 11.6 10*3/uL — ABNORMAL HIGH (ref 4.0–10.5)

## 2018-03-15 LAB — CBG MONITORING, ED: Glucose-Capillary: 92 mg/dL (ref 65–99)

## 2018-03-15 MED ORDER — LEVETIRACETAM 500 MG PO TABS: 1000.0000 mg | ORAL_TABLET | Freq: Two times a day (BID) | ORAL | 0 refills | Status: DC

## 2018-03-15 MED ORDER — LEVETIRACETAM IN NACL 1000 MG/100ML IV SOLN
1000.0000 mg | Freq: Once | INTRAVENOUS | Status: AC
  Administered 2018-03-15: 1000 mg via INTRAVENOUS
  Filled 2018-03-15: qty 100

## 2018-03-15 MED ORDER — EPINEPHRINE 0.3 MG/0.3ML IJ SOAJ: 0.3000 mg | Freq: Once | INTRAMUSCULAR | 0 refills | Status: AC

## 2018-03-15 NOTE — Discharge Instructions (Addendum)
Call Dr.Doonqauh's office tomorrow manage your seizures.  Keep your scheduled appointment with Dr. Emelda FearFerguson on 03/21/2018.  Ask Dr. Emelda FearFerguson to help you to stop smoking

## 2018-03-15 NOTE — Progress Notes (Signed)
Pt placed on 2L [place]

## 2018-03-15 NOTE — ED Triage Notes (Signed)
Pt had witnessed seizure at home lasting x one minute. Per ems pt had additional seizure lasting around 45 seconds, no meds were given. Pt was taken off keppra due to pregnancy.

## 2018-03-15 NOTE — ED Notes (Signed)
Pt pants wet of urine. Shorts removed and placed in belonging's bag

## 2018-03-15 NOTE — ED Provider Notes (Addendum)
[facility] EMERGENCY DEPARTMENT Provider Note   CSN: [id number] Arrival date & time: 03/15/18  2041     History   Chief Complaint Chief Complaint  Patient presents with  . Seizures    HPI Erin Krueger is a 22 y.o. female.  Patient had 2 seizures tonight.  Second seizure witnessed by EMS which lasted 45 seconds, was generalized.  She presently complains of diffuse headache which she gets typically after a seizure.  No treatment prior to coming here.  She was taken off of Keppra recently by her gynecologist.  She is presently [redacted] weeks pregnant.  HPI  Past Medical History:  Diagnosis Date  . ADHD (attention deficit hyperactivity disorder)   . Allergy   . Anxiety   . Asthma   . Eating disorder   . Headache(784.0)   . ODD (oppositional defiant disorder)   . PTSD (post-traumatic stress disorder)   . Seizures [facility])     Patient Active Problem List   Diagnosis Date Noted  . 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 Pregnancy 11/17/2016  . PID (acute pelvic inflammatory disease) 01/29/2016  . Seizure disorder (HCC) 01/07/2016  . Seizure (HCC) 01/07/2016  . 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 PTSD (post-traumatic stress disorder) 05/12/2013  . 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 Polysubstance abuse (HCC) 05/12/2013    Past Surgical History:  Procedure Laterality Date  . TONSILLECTOMY       OB History    Gravida  2   Para      Term      Preterm      AB      Living        SAB      TAB      Ectopic      Multiple      Live Births               Home Medications    Prior to Admission medications   Medication Sig Start Date End Date Taking? Authorizing Provider  beclomethasone (QVAR) 40 MCG/ACT inhaler Inhale 2 puffs into the lungs daily.    [provider]  cephALEXin (KEFLEX) 500 MG capsule Take 1 capsule (500 mg total) by mouth 4 (four) times daily. 03/04/18   Jacalyn Lefevre, MD    EPINEPHrine 0.3 mg/0.3 mL IJ SOAJ injection Inject 0.3 mg into the muscle as needed (Bee Stings).    [provider]  folic acid (FOLVITE) 1 MG tablet Take 2 tablets (2 mg total) by mouth daily. 11/21/16   Alison Murray, MD  ibuprofen (ADVIL,MOTRIN) 800 MG tablet Take 800 mg by mouth every 6 (six) hours.    [provider]  levETIRAcetam (KEPPRA) 750 MG tablet Take 1 tablet (750 mg total) by mouth 2 (two) times daily. 02/05/18   Devoria Albe, MD  Prenatal Vit-Fe Fumarate-FA (PRENATAL MULTIVITAMIN) TABS tablet Take 1 tablet by mouth daily at 12 noon. 03/04/18   Jacalyn Lefevre, MD    Family History Family History  Problem Relation Age of Onset  . Diabetes Mother   . HIV Mother   . Diabetes Father   . HIV Father   . Diabetes Sister   . Diabetes Brother   . Breast cancer Maternal Grandmother     Social History Social History   Tobacco Use  . Smoking status: Current Every Day Smoker    Packs/day: 0.50    Types: Cigarettes  . Smokeless tobacco: Never Used  Substance Use Topics  .  Alcohol use: Not Currently    Comment: 3-4 glassed vodka/beer(not while pregnant)  . Drug use: Not Currently    Types: Marijuana    Comment: K2(none while pregnant) cocaine last use 2018     Allergies   Bee venom; Cashew nut oil; Cocoa butter; Prednisone; Shellfish allergy; and Latex   Review of Systems Review of Systems  Constitutional: Negative.   HENT: Negative.   Respiratory: Negative.   Cardiovascular: Negative.   Gastrointestinal: Negative.   Genitourinary:       Pregnant  Musculoskeletal: Negative.   Skin: Negative.   Neurological: Positive for seizures and headaches.  Psychiatric/Behavioral: Negative.   All other systems reviewed and are negative.    Physical Exam Updated Vital Signs BP 119/84 (BP Location: Right Arm)   Pulse 88   Temp 99.4 F (37.4 C) (Oral)   Resp 15   Ht 5\' 7"  (1.702 m)   Wt 83.9 kg (185 lb)   LMP 12/30/2017 (Within Days)   SpO2 100%    BMI 28.98 kg/m   Physical Exam  Constitutional: She is oriented to person, place, and time. She appears well-developed and well-nourished. She appears distressed.  Peers mildly uncomfortable coma score 15  HENT:  Head: Normocephalic and atraumatic.  Eyes: Pupils are equal, round, and reactive to light. Conjunctivae are normal.  Neck: Neck supple. No tracheal deviation present. No thyromegaly present.  Cardiovascular: Normal rate and regular rhythm.  No murmur heard. Pulmonary/Chest: Effort normal and breath sounds normal.  Abdominal: Soft. Bowel sounds are normal. She exhibits no distension. There is no tenderness.  Musculoskeletal: Normal range of motion. She exhibits no edema or tenderness.  Neurological: She is alert and oriented to person, place, and time. She displays normal reflexes. No cranial nerve deficit. She exhibits normal muscle tone. Coordination normal.  Skin: Skin is warm and dry. Capillary refill takes less than 2 seconds. No rash noted.  Psychiatric: She has a normal mood and affect.  Nursing note and vitals reviewed.    ED Treatments / Results  Labs (all labs ordered are listed, but only abnormal results are displayed) Labs Reviewed  CBC WITH DIFFERENTIAL/PLATELET - Abnormal; Notable for the following components:      Result Value   WBC 11.6 (*)    All other components within normal limits  COMPREHENSIVE METABOLIC PANEL  CBG MONITORING, ED   Results for orders placed or performed during the hospital encounter of 03/15/18  Comprehensive metabolic panel  Result Value Ref Range   Sodium 135 135 - 145 mmol/L   Potassium 3.5 3.5 - 5.1 mmol/L   Chloride 105 101 - 111 mmol/L   CO2 22 22 - 32 mmol/L   Glucose, Bld 97 65 - 99 mg/dL   BUN 8 6 - 20 mg/dL   Creatinine, Ser 9.140.56 0.44 - 1.00 mg/dL   Calcium 8.8 (L) 8.9 - 10.3 mg/dL   Total Protein 6.9 6.5 - 8.1 g/dL   Albumin 3.9 3.5 - 5.0 g/dL   AST 12 (L) 15 - 41 U/L   ALT 13 (L) 14 - 54 U/L   Alkaline  Phosphatase 62 38 - 126 U/L   Total Bilirubin 0.3 0.3 - 1.2 mg/dL   GFR calc non Af Amer >60 >60 mL/min   GFR calc Af Amer >60 >60 mL/min   Anion gap 8 5 - 15  CBC with Differential/Platelet  Result Value Ref Range   WBC 11.6 (H) 4.0 - 10.5 K/uL   RBC 4.52 3.87 - 5.11  MIL/uL   Hemoglobin 13.4 12.0 - 15.0 g/dL   HCT 16.1 09.6 - 04.5 %   MCV 89.8 78.0 - 100.0 fL   MCH 29.6 26.0 - 34.0 pg   MCHC 33.0 30.0 - 36.0 g/dL   RDW 40.9 81.1 - 91.4 %   Platelets 233 150 - 400 K/uL   Neutrophils Relative % 65 %   Neutro Abs 7.4 1.7 - 7.7 K/uL   Lymphocytes Relative 26 %   Lymphs Abs 3.0 0.7 - 4.0 K/uL   Monocytes Relative 8 %   Monocytes Absolute 1.0 0.1 - 1.0 K/uL   Eosinophils Relative 1 %   Eosinophils Absolute 0.2 0.0 - 0.7 K/uL   Basophils Relative 0 %   Basophils Absolute 0.0 0.0 - 0.1 K/uL  CBG monitoring, ED  Result Value Ref Range   Glucose-Capillary 92 65 - 99 mg/dL   US Ob Comp < 14 Wks  Result Date: 03/04/2018 CLINICAL DATA:  Pregnant patient in first-trimester pregnancy with left lower quadrant pain. EXAM: OBSTETRIC <14 WK [place] AND TRANSVAGINAL OB US TECHNIQUE: Both transabdominal and transvaginal ultrasound examinations were performed for complete evaluation of the gestation as well as the maternal uterus, adnexal regions, and pelvic cul-de-sac. Transvaginal technique was performed to assess early pregnancy. COMPARISON:  None. FINDINGS: Intrauterine gestational sac: Single Yolk sac:  Not Visualized. Embryo:  Not Visualized. Cardiac Activity: Not Visualized. MSD: 5.4 mm   5 w   2 d Subchorionic hemorrhage:  None visualized. Maternal uterus/adnexae: Neither ovary is visualized due to bowel gas. No adnexal mass. No pelvic free fluid. IMPRESSION: Probable early intrauterine gestational sac, but no yolk sac, fetal pole, or cardiac activity yet visualized. Recommend follow-up quantitative B-HCG levels and follow-up US in 14 days to assess viability. This recommendation follows SRU consensus  guidelines: Diagnostic Criteria for Nonviable Pregnancy Early in the First Trimester. Malva Limes Med 2013; 782:9562-13. Electronically Signed   By: Rubye Oaks M.D.   On: 03/04/2018 23:00   US Ob Transvaginal  Result Date: 03/04/2018 CLINICAL DATA:  Pregnant patient in first-trimester pregnancy with left lower quadrant pain. EXAM: OBSTETRIC <14 WK [place] AND TRANSVAGINAL OB US TECHNIQUE: Both transabdominal and transvaginal ultrasound examinations were performed for complete evaluation of the gestation as well as the maternal uterus, adnexal regions, and pelvic cul-de-sac. Transvaginal technique was performed to assess early pregnancy. COMPARISON:  None. FINDINGS: Intrauterine gestational sac: Single Yolk sac:  Not Visualized. Embryo:  Not Visualized. Cardiac Activity: Not Visualized. MSD: 5.4 mm   5 w   2 d Subchorionic hemorrhage:  None visualized. Maternal uterus/adnexae: Neither ovary is visualized due to bowel gas. No adnexal mass. No pelvic free fluid. IMPRESSION: Probable early intrauterine gestational sac, but no yolk sac, fetal pole, or cardiac activity yet visualized. Recommend follow-up quantitative B-HCG levels and follow-up US in 14 days to assess viability. This recommendation follows SRU consensus guidelines: Diagnostic Criteria for Nonviable Pregnancy Early in the First Trimester. Malva Limes Med 2013; 086:5784-69. Electronically Signed   By: Rubye Oaks M.D.   On: 03/04/2018 23:00    EKG None  Radiology No results found.  Procedures Procedures (including critical care time)  Medications Ordered in ED Medications  levETIRAcetam (KEPPRA) IVPB 1000 mg/100 mL premix (0 mg Intravenous Stopped 03/15/18 2133)     Initial Impression / Assessment and Plan / ED Course  I have reviewed the triage vital signs and the nursing notes.  Pertinent labs & imaging results that were available during my care  of the patient were reviewed by me and considered in my medical decision making (see  chart for details).     I consulted hospital pharmacist who suggested restarting Keppra as Keppra is likely the safest of the anticonvulsants to take during pregnancy.  IV Keppra 1 g ordered as loading dose per recommendation of hospital pharmacist 10:40 PM patient is alert ambulates without difficulty feels very for discharge.  She reports to me that she was on Keppra 1000 mg twice daily which I will write prescription for.  I counseled patient for 5 minutes on smoking cessation.  I will also refer patient Dr.[place] neurologist.  She is instructed to keep her scheduled appoint with her gynecologist Dr. Emelda Fear on 03/21/2018.  I will also write prescription for EpiPen's patient states she is allergic to bees and wasps and does not currently have a primary care physician I explained to her that Dr. Emelda Fear can act as her with her primary care physician Final Clinical Impressions(s) / ED Diagnoses  Dx #1 seizure disorder Final diagnoses:  None  #2 tobacco abuse  ED Discharge Orders    None       Doug Sou, MD 03/15/18 2249 CRITICAL CARE Performed by: Doug Sou Total critical care time: 30 minutes Critical care time was exclusive of separately billable procedures and treating other patients. Critical care was necessary to treat or prevent imminent or life-threatening deterioration. Critical care was time spent personally by me on the following activities: development of treatment plan with patient and/or surrogate as well as nursing, discussions with consultants, evaluation of patient's response to treatment, examination of patient, obtaining history from patient or surrogate, ordering and performing treatments and interventions, ordering and review of laboratory studies, ordering and review of radiographic studies, pulse oximetry and re-evaluation of patient's condition.   Doug Sou, MD 05/24/18 1436

## 2018-03-16 ENCOUNTER — Telehealth: Payer: Self-pay | Admitting: Obstetrics and Gynecology

## 2018-03-16 NOTE — Telephone Encounter (Signed)
Patient has restarted her Keppra after ED visit last night for seizures. I explained that we support use of Keppra during pregnancy. Pt reports she was told to stop Keppra "by another doctor". That was not our office's advice.

## 2018-03-18 ENCOUNTER — Other Ambulatory Visit: Payer: Self-pay | Admitting: Obstetrics & Gynecology

## 2018-03-18 DIAGNOSIS — O3680X0 Pregnancy with inconclusive fetal viability, not applicable or unspecified: Secondary | ICD-10-CM

## 2018-03-21 ENCOUNTER — Ambulatory Visit (INDEPENDENT_AMBULATORY_CARE_PROVIDER_SITE_OTHER): Payer: Medicaid Other

## 2018-03-21 DIAGNOSIS — Z3A01 Less than 8 weeks gestation of pregnancy: Secondary | ICD-10-CM

## 2018-03-21 DIAGNOSIS — O3680X0 Pregnancy with inconclusive fetal viability, not applicable or unspecified: Secondary | ICD-10-CM

## 2018-03-21 NOTE — Progress Notes (Signed)
[place] 7+5 wks,single IUP w/ys,positive fht 150 bpm,crl 12.32 mm,normal ovaries bilat

## 2018-04-05 ENCOUNTER — Ambulatory Visit: Payer: Medicaid Other | Admitting: *Deleted

## 2018-04-05 ENCOUNTER — Encounter: Payer: Self-pay | Admitting: Women's Health

## 2018-04-05 ENCOUNTER — Other Ambulatory Visit (HOSPITAL_COMMUNITY)
Admission: RE | Admit: 2018-04-05 | Discharge: 2018-04-05 | Disposition: A | Discharge status: Home / Self Care | Payer: Medicaid Other | Source: Ambulatory Visit | Attending: Obstetrics & Gynecology | Admitting: Obstetrics & Gynecology

## 2018-04-05 ENCOUNTER — Ambulatory Visit (INDEPENDENT_AMBULATORY_CARE_PROVIDER_SITE_OTHER): Payer: Medicaid Other | Admitting: Women's Health

## 2018-04-05 VITALS — BP 110/70 | HR 70 | Wt 187.0 lb

## 2018-04-05 DIAGNOSIS — O99341 Other mental disorders complicating pregnancy, first trimester: Secondary | ICD-10-CM

## 2018-04-05 DIAGNOSIS — O09291 Supervision of pregnancy with other poor reproductive or obstetric history, first trimester: Secondary | ICD-10-CM | POA: Diagnosis not present

## 2018-04-05 DIAGNOSIS — Z8279 Family history of other congenital malformations, deformations and chromosomal abnormalities: Secondary | ICD-10-CM | POA: Insufficient documentation

## 2018-04-05 DIAGNOSIS — Z124 Encounter for screening for malignant neoplasm of cervix: Secondary | ICD-10-CM

## 2018-04-05 DIAGNOSIS — F17200 Nicotine dependence, unspecified, uncomplicated: Secondary | ICD-10-CM | POA: Insufficient documentation

## 2018-04-05 DIAGNOSIS — Z3A09 9 weeks gestation of pregnancy: Secondary | ICD-10-CM | POA: Insufficient documentation

## 2018-04-05 DIAGNOSIS — Z3481 Encounter for supervision of other normal pregnancy, first trimester: Secondary | ICD-10-CM

## 2018-04-05 DIAGNOSIS — O99321 Drug use complicating pregnancy, first trimester: Secondary | ICD-10-CM

## 2018-04-05 DIAGNOSIS — Z3490 Encounter for supervision of normal pregnancy, unspecified, unspecified trimester: Secondary | ICD-10-CM | POA: Insufficient documentation

## 2018-04-05 DIAGNOSIS — Z1389 Encounter for screening for other disorder: Secondary | ICD-10-CM

## 2018-04-05 DIAGNOSIS — Z634 Disappearance and death of family member: Secondary | ICD-10-CM

## 2018-04-05 DIAGNOSIS — F17210 Nicotine dependence, cigarettes, uncomplicated: Secondary | ICD-10-CM

## 2018-04-05 DIAGNOSIS — Z3682 Encounter for antenatal screening for nuchal translucency: Secondary | ICD-10-CM

## 2018-04-05 DIAGNOSIS — Z331 Pregnant state, incidental: Secondary | ICD-10-CM

## 2018-04-05 LAB — POCT URINALYSIS DIPSTICK
Blood, UA: NEGATIVE
Glucose, UA: NEGATIVE
Ketones, UA: NEGATIVE
Nitrite, UA: NEGATIVE

## 2018-04-05 MED ORDER — ALBUTEROL SULFATE HFA 108 (90 BASE) MCG/ACT IN AERS: 2.0000 | INHALATION_SPRAY | Freq: Four times a day (QID) | RESPIRATORY_TRACT | 2 refills | Status: DC | PRN

## 2018-04-05 NOTE — Progress Notes (Signed)
INITIAL OBSTETRICAL VISIT Patient name: Erin Krueger MRN [id number]  Date of birth: 07/24/1996 Chief Complaint:   Initial Prenatal Visit (cramping, spotting last week)  History of Present Illness:   Erin Krueger is a 22 y.o. G61P1000 Caucasian female at [redacted]w[redacted]d by LMP c/w 7wk u/s, with an Estimated Date of Delivery: 11/02/18 being seen today for her initial obstetrical visit.   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  H/O seizures, last one about 3wks ago b/c she was told by someone to come off of Keppra d/t pregnancy. She has since restarted Keppra, getting in w/ Dr. Gerilyn Pilgrim here, recently moved to the area.  Dep/anx> no meds currently, doing well Smoker: 1ppd down to 1pack q 3d, wants to quit Today she reports some intermittent cramping/spotting. Denies abnormal discharge, itching/odor/irritation.   Patient's last menstrual period was 01/26/2018. Last pap never. Results were: n/a Review of Systems:   Pertinent items are noted in HPI Denies cramping/contractions, leakage of fluid, vaginal bleeding, abnormal vaginal discharge w/ itching/odor/irritation, headaches, visual changes, shortness of breath, chest pain, abdominal pain, severe nausea/vomiting, or problems with urination or bowel movements unless otherwise stated above.  Pertinent History Reviewed:  Reviewed past medical,surgical, social, obstetrical and family history.  Reviewed problem list, medications and allergies. OB History  Gravida Para Term Preterm AB Living  0  SAB TAB Ectopic Multiple Live Births          1    # Outcome Date GA Lbr Len/2nd Weight Sex Delivery Anes PTL Lv  2 Current           1 Term 06/23/17 [redacted]w[redacted]d  6 lb 8 oz (2.948 kg) F Vag-Spont EPI N DEC     Birth Comments: cardiac defect   Physical Assessment:   Vitals:   04/05/18 1407  BP: 110/70  Pulse:  70  Weight: 187 lb (84.8 kg)  Body mass index is 29.29 kg/m.       Physical Examination:  General appearance - well appearing, and in no distress  Mental status - alert, oriented to person, place, and time  Psych:  She has a normal mood and affect  Skin - warm and dry, normal color, no suspicious lesions noted  Chest - effort normal, all lung fields clear to auscultation bilaterally  Heart - normal rate and regular rhythm  Abdomen - soft, nontender  Extremities:  No swelling or varicosities noted  Pelvic - VULVA: normal appearing vulva with no masses, tenderness or lesions  VAGINA: normal appearing vagina with normal color and discharge, no lesions  CERVIX: normal appearing cervix without discharge or lesions, no CMT  Thin prep pap is done w/ reflex HR HPV cotesting  Fetal Heart Rate (bpm): +u/s via informal transabdominal u/s  Results for orders placed or performed in visit on 04/05/18 (from the past 24 hour(s))  POCT urinalysis dipstick   Collection Time: 04/05/18  2:38 PM  Result Value Ref Range   Color, UA     Clarity, UA     Glucose, UA neg    Bilirubin, UA     Ketones, UA neg    Spec Grav, UA  1.010 - 1.025   Blood, UA neg    pH, UA  5.0 - 8.0   Protein, UA trace    Urobilinogen, UA  0.2 or 1.0 E.U./dL  Nitrite, UA neg    Leukocytes, UA Small (1+) (A) Negative   Appearance     Odor      Assessment & Plan:  1) Low-Risk Pregnancy G2P1000 at [redacted]w[redacted]d with an Estimated Date of Delivery: 11/02/18   2) Initial OB visit  3) H/O infant death @ old d/t cardiac anomaly> will get fetal echo @ 24wks  4) Seizures> continue Elizabeth 1,000mg  BID, make appt w/ Dr. Gerilyn Pilgrim to establish care here  5) Dep/anx> no meds currently, doing well  6) Smoker> Smokes 1pk q 3/day, counseled x 3-59mins, advised cessation, discussed risks to fetus while pregnant, to infant pp, and to herself. Offered [employer]uit[employer], accepted, referral sent.     Meds:  Meds ordered this encounter  Medications   . albuterol (PROVENTIL HFA;VENTOLIN HFA) 108 (90 Base) MCG/ACT inhaler    Sig: Inhale 2 puffs into the lungs every 6 (six) hours as needed for wheezing or shortness of breath.    Dispense:  1 Inhaler    Refill:  2    Order Specific Question:   Supervising Provider    Answer:   Lazaro Arms [[id number]]    Initial labs obtained Continue prenatal vitamins Reviewed n/v relief measures and warning s/s to report Reviewed recommended weight gain based on pre-gravid BMI Encouraged well-balanced diet Genetic Screening discussed Integrated Screen: requested Cystic fibrosis screening discussed requested Ultrasound discussed; fetal survey: requested CCNC completed>spoke w/ Jasmine  Follow-up: Return in about 3 weeks (around 04/27/2018) for LROB, US:NT+1stIT.   Orders Placed This Encounter  Procedures  . Urine Culture  . US Fetal Nuchal Translucency Measurement  . Obstetric Panel, Including HIV  . Urinalysis, Routine w reflex microscopic  . Pain Management Screening Profile (10S)  . Cystic Fibrosis Mutation 97  . POCT urinalysis dipstick    Cheral Marker CNM, [facility] 04/05/2018 5:04 PM

## 2018-04-05 NOTE — Patient Instructions (Signed)
[facility], I greatly value your feedback.  If you receive a survey following your visit with [place] today, we appreciate you taking the time to fill it out.  Thanks, Joellyn Haff, CNM, WHNP-BC   Nausea & Vomiting  Have saltine crackers or pretzels by your bed and eat a few bites before you raise your head out of bed in the morning  Eat small frequent meals throughout the day instead of large meals  Drink plenty of fluids throughout the day to stay hydrated, just don't drink a lot of fluids with your meals.  This can make your stomach fill up faster making you feel sick  Do not brush your teeth right after you eat  Products with real ginger are good for nausea, like ginger ale and ginger hard candy Make sure it says made with real ginger!  Sucking on sour candy like lemon heads is also good for nausea  If your prenatal vitamins make you nauseated, take them at night so you will sleep through the nausea  Sea Bands  If you feel like you need medicine for the nausea & vomiting please let us know  If you are unable to keep any fluids or food down please let us know   Constipation  Drink plenty of fluid, preferably water, throughout the day  Eat foods high in fiber such as fruits, vegetables, and grains  Exercise, such as walking, is a good way to keep your bowels regular  Drink warm fluids, especially warm prune juice, or decaf coffee  Eat a 1/2 cup of real oatmeal (not instant), 1/2 cup applesauce, and 1/2-1 cup warm prune juice every day  If needed, you may take Colace (docusate sodium) stool softener once or twice a day to help keep the stool soft. If you are pregnant, wait until you are out of your first trimester (12-14 weeks of pregnancy)  If you still are having problems with constipation, you may take Miralax once daily as needed to help keep your bowels regular.  If you are pregnant, wait until you are out of your first trimester (12-14 weeks of pregnancy)   First  Trimester of Pregnancy The first trimester of pregnancy is from week 1 until the end of week 12 (months 1 through 3). A week after a sperm fertilizes an egg, the egg will implant on the wall of the uterus. This embryo will begin to develop into a baby. Genes from you and your partner are forming the baby. The female genes determine whether the baby is a boy or a girl. At 6-8 weeks, the eyes and face are formed, and the heartbeat can be seen on ultrasound. At the end of 12 weeks, all the baby's organs are formed.  Now that you are pregnant, you will want to do everything you can to have a healthy baby. Two of the most important things are to get good prenatal care and to follow your health care provider's instructions. Prenatal care is all the medical care you receive before the baby's birth. This care will help prevent, find, and treat any problems during the pregnancy and childbirth. BODY CHANGES Your body goes through many changes during pregnancy. The changes vary from woman to woman.   You may gain or lose a couple of pounds at first.  You may feel sick to your stomach (nauseous) and throw up (vomit). If the vomiting is uncontrollable, call your health care provider.  You may tire easily.  You may develop headaches that  can be relieved by medicines approved by your health care provider.  You may urinate more often. Painful urination may mean you have a bladder infection.  You may develop heartburn as a result of your pregnancy.  You may develop constipation because certain hormones are causing the muscles that push waste through your intestines to slow down.  You may develop hemorrhoids or swollen, bulging veins (varicose veins).  Your breasts may begin to grow larger and become tender. Your nipples may stick out more, and the tissue that surrounds them (areola) may become darker.  Your gums may bleed and may be sensitive to brushing and flossing.  Dark spots or blotches (chloasma, mask  of pregnancy) may develop on your face. This will likely fade after the baby is born.  Your menstrual periods will stop.  You may have a loss of appetite.  You may develop cravings for certain kinds of food.  You may have changes in your emotions from day to day, such as being excited to be pregnant or being concerned that something may go wrong with the pregnancy and baby.  You may have more vivid and strange dreams.  You may have changes in your hair. These can include thickening of your hair, rapid growth, and changes in texture. Some women also have hair loss during or after pregnancy, or hair that feels dry or thin. Your hair will most likely return to normal after your baby is born. WHAT TO EXPECT AT YOUR PRENATAL VISITS During a routine prenatal visit:  You will be weighed to make sure you and the baby are growing normally.  Your blood pressure will be taken.  Your abdomen will be measured to track your baby's growth.  The fetal heartbeat will be listened to starting around week 10 or 12 of your pregnancy.  Test results from any previous visits will be discussed. Your health care provider may ask you:  How you are feeling.  If you are feeling the baby move.  If you have had any abnormal symptoms, such as leaking fluid, bleeding, severe headaches, or abdominal cramping.  If you have any questions. Other tests that may be performed during your first trimester include:  Blood tests to find your blood type and to check for the presence of any previous infections. They will also be used to check for low iron levels (anemia) and Rh antibodies. Later in the pregnancy, blood tests for diabetes will be done along with other tests if problems develop.  Urine tests to check for infections, diabetes, or protein in the urine.  An ultrasound to confirm the proper growth and development of the baby.  An amniocentesis to check for possible genetic problems.  Fetal screens for spina  bifida and Down syndrome.  You may need other tests to make sure you and the baby are doing well. HOME CARE INSTRUCTIONS  Medicines  Follow your health care provider's instructions regarding medicine use. Specific medicines may be either safe or unsafe to take during pregnancy.  Take your prenatal vitamins as directed.  If you develop constipation, try taking a stool softener if your health care provider approves. Diet  Eat regular, well-balanced meals. Choose a variety of foods, such as meat or vegetable-based protein, fish, milk and low-fat dairy products, vegetables, fruits, and whole grain breads and cereals. Your health care provider will help you determine the amount of weight gain that is right for you.  Avoid raw meat and uncooked cheese. These carry germs that can cause  birth defects in the baby.  Eating four or five small meals rather than three large meals a day may help relieve nausea and vomiting. If you start to feel nauseous, eating a few soda crackers can be helpful. Drinking liquids between meals instead of during meals also seems to help nausea and vomiting.  If you develop constipation, eat more high-fiber foods, such as fresh vegetables or fruit and whole grains. Drink enough fluids to keep your urine clear or pale yellow. Activity and Exercise  Exercise only as directed by your health care provider. Exercising will help you:  Control your weight.  Stay in shape.  Be prepared for labor and delivery.  Experiencing pain or cramping in the lower abdomen or low back is a good sign that you should stop exercising. Check with your health care provider before continuing normal exercises.  Try to avoid standing for long periods of time. Move your legs often if you must stand in one place for a long time.  Avoid heavy lifting.  Wear low-heeled shoes, and practice good posture.  You may continue to have sex unless your health care provider directs you  otherwise. Relief of Pain or Discomfort  Wear a good support bra for breast tenderness.   Take warm sitz baths to soothe any pain or discomfort caused by hemorrhoids. Use hemorrhoid cream if your health care provider approves.   Rest with your legs elevated if you have leg cramps or low back pain.  If you develop varicose veins in your legs, wear support hose. Elevate your feet for 15 minutes, 3-4 times a day. Limit salt in your diet. Prenatal Care  Schedule your prenatal visits by the twelfth week of pregnancy. They are usually scheduled monthly at first, then more often in the last 2 months before delivery.  Write down your questions. Take them to your prenatal visits.  Keep all your prenatal visits as directed by your health care provider. Safety  Wear your seat belt at all times when driving.  Make a list of emergency phone numbers, including numbers for family, friends, the hospital, and police and fire departments. General Tips  Ask your health care provider for a referral to a local prenatal education class. Begin classes no later than at the beginning of month 6 of your pregnancy.  Ask for help if you have counseling or nutritional needs during pregnancy. Your health care provider can offer advice or refer you to specialists for help with various needs.  Do not use hot tubs, steam rooms, or saunas.  Do not douche or use tampons or scented sanitary pads.  Do not cross your legs for long periods of time.  Avoid cat litter boxes and soil used by cats. These carry germs that can cause birth defects in the baby and possibly loss of the fetus by miscarriage or stillbirth.  Avoid all smoking, herbs, alcohol, and medicines not prescribed by your health care provider. Chemicals in these affect the formation and growth of the baby.  Schedule a dentist appointment. At home, brush your teeth with a soft toothbrush and be gentle when you floss. SEEK MEDICAL CARE IF:   You have  dizziness.  You have mild pelvic cramps, pelvic pressure, or nagging pain in the abdominal area.  You have persistent nausea, vomiting, or diarrhea.  You have a bad smelling vaginal discharge.  You have pain with urination.  You notice increased swelling in your face, hands, legs, or ankles. SEEK IMMEDIATE MEDICAL CARE IF:  You have a fever.  You are leaking fluid from your vagina.  You have spotting or bleeding from your vagina.  You have severe abdominal cramping or pain.  You have rapid weight gain or loss.  You vomit blood or material that looks like coffee grounds.  You are exposed to [place] measles and have never had them.  You are exposed to fifth disease or chickenpox.  You develop a severe headache.  You have shortness of breath.  You have any kind of trauma, such as from a fall or a car accident. Document Released: 11/03/2001 Document Revised: 03/26/2014 Document Reviewed: 09/19/2013 [facility] Patient Information 2015 [place], [place]. This information is not intended to replace advice given to you by your health care provider. Make sure you discuss any questions you have with your health care provider.

## 2018-04-06 LAB — OBSTETRIC PANEL, INCLUDING HIV
Antibody Screen: NEGATIVE
Basophils Absolute: 0 10*3/uL (ref 0.0–0.2)
Basos: 0 %
EOS (ABSOLUTE): 0.2 10*3/uL (ref 0.0–0.4)
Eos: 2 %
HIV Screen 4th Generation wRfx: NONREACTIVE
Hematocrit: 40.2 % (ref 34.0–46.6)
Hemoglobin: 13.5 g/dL (ref 11.1–15.9)
Hepatitis B Surface Ag: NEGATIVE
Immature Grans (Abs): 0 10*3/uL (ref 0.0–0.1)
Immature Granulocytes: 0 %
Lymphocytes Absolute: 2.7 10*3/uL (ref 0.7–3.1)
Lymphs: 24 %
MCH: 30.6 pg (ref 26.6–33.0)
MCHC: 33.6 g/dL (ref 31.5–35.7)
MCV: 91 fL (ref 79–97)
Monocytes Absolute: 0.7 10*3/uL (ref 0.1–0.9)
Monocytes: 7 %
Neutrophils Absolute: 7.5 10*3/uL — ABNORMAL HIGH (ref 1.4–7.0)
Neutrophils: 67 %
Platelets: 241 10*3/uL (ref 150–379)
RBC: 4.41 x10E6/uL (ref 3.77–5.28)
RDW: 13.4 % (ref 12.3–15.4)
RPR Ser Ql: NONREACTIVE
Rh Factor: POSITIVE
Rubella Antibodies, IGG: 8.93 index (ref >0.99)
WBC: 11.1 10*3/uL — ABNORMAL HIGH (ref 3.4–10.8)

## 2018-04-06 LAB — URINALYSIS, ROUTINE W REFLEX MICROSCOPIC
Bilirubin, UA: NEGATIVE
Glucose, UA: NEGATIVE
Ketones, UA: NEGATIVE
Nitrite, UA: POSITIVE — AB
Protein, UA: NEGATIVE
RBC, UA: NEGATIVE
Specific Gravity, UA: 1.021 (ref 1.005–1.030)
Urobilinogen, Ur: 0.2 mg/dL (ref 0.2–1.0)
pH, UA: 7.5 (ref 5.0–7.5)

## 2018-04-06 LAB — MICROSCOPIC EXAMINATION
Casts: NONE SEEN /lpf
WBC, UA: >30 /hpf — AB (ref 0–5)

## 2018-04-06 LAB — PMP SCREEN PROFILE (10S), URINE
Amphetamine Scrn, Ur: NEGATIVE ng/mL
BARBITURATE SCREEN URINE: NEGATIVE ng/mL
BENZODIAZEPINE SCREEN, URINE: NEGATIVE ng/mL
CANNABINOIDS UR QL SCN: POSITIVE ng/mL — AB
Cocaine (Metab) Scrn, Ur: NEGATIVE ng/mL
Creatinine(Crt), U: 212.7 mg/dL (ref 20.0–300.0)
Methadone Screen, Urine: NEGATIVE ng/mL
OXYCODONE+OXYMORPHONE UR QL SCN: NEGATIVE ng/mL
Opiate Scrn, Ur: NEGATIVE ng/mL
Ph of Urine: 8 (ref 4.5–8.9)
Phencyclidine Qn, Ur: NEGATIVE ng/mL
Propoxyphene Scrn, Ur: NEGATIVE ng/mL

## 2018-04-07 ENCOUNTER — Encounter: Payer: Self-pay | Admitting: Women's Health

## 2018-04-07 ENCOUNTER — Other Ambulatory Visit: Payer: Self-pay | Admitting: Women's Health

## 2018-04-07 DIAGNOSIS — F1290 Cannabis use, unspecified, uncomplicated: Secondary | ICD-10-CM | POA: Insufficient documentation

## 2018-04-07 LAB — URINE CULTURE

## 2018-04-08 ENCOUNTER — Telehealth: Payer: Self-pay | Admitting: *Deleted

## 2018-04-08 ENCOUNTER — Emergency Department (HOSPITAL_COMMUNITY)
Admission: EM | Admit: 2018-04-08 | Discharge: 2018-04-09 | Disposition: A | Discharge status: Home / Self Care | Payer: Medicaid Other | Attending: Emergency Medicine | Admitting: Emergency Medicine

## 2018-04-08 ENCOUNTER — Other Ambulatory Visit: Payer: Self-pay

## 2018-04-08 ENCOUNTER — Other Ambulatory Visit: Payer: Self-pay | Admitting: Women's Health

## 2018-04-08 ENCOUNTER — Encounter: Payer: Self-pay | Admitting: Women's Health

## 2018-04-08 ENCOUNTER — Encounter (HOSPITAL_COMMUNITY): Payer: Self-pay

## 2018-04-08 DIAGNOSIS — O2340 Unspecified infection of urinary tract in pregnancy, unspecified trimester: Secondary | ICD-10-CM | POA: Insufficient documentation

## 2018-04-08 DIAGNOSIS — Z87891 Personal history of nicotine dependence: Secondary | ICD-10-CM | POA: Diagnosis not present

## 2018-04-08 DIAGNOSIS — J45909 Unspecified asthma, uncomplicated: Secondary | ICD-10-CM | POA: Insufficient documentation

## 2018-04-08 DIAGNOSIS — Z79899 Other long term (current) drug therapy: Secondary | ICD-10-CM | POA: Diagnosis not present

## 2018-04-08 DIAGNOSIS — R569 Unspecified convulsions: Secondary | ICD-10-CM

## 2018-04-08 DIAGNOSIS — Z91040 Latex allergy status: Secondary | ICD-10-CM | POA: Insufficient documentation

## 2018-04-08 DIAGNOSIS — Z3A10 10 weeks gestation of pregnancy: Secondary | ICD-10-CM | POA: Insufficient documentation

## 2018-04-08 DIAGNOSIS — O99351 Diseases of the nervous system complicating pregnancy, first trimester: Secondary | ICD-10-CM | POA: Diagnosis present

## 2018-04-08 LAB — CYTOLOGY - PAP
Chlamydia: NEGATIVE
Diagnosis: NEGATIVE
Diagnosis: REACTIVE
Neisseria Gonorrhea: NEGATIVE

## 2018-04-08 MED ORDER — SODIUM CHLORIDE 0.9 % IV BOLUS
500.0000 mL | Freq: Once | INTRAVENOUS | Status: AC
  Administered 2018-04-08: 500 mL via INTRAVENOUS

## 2018-04-08 MED ORDER — SULFAMETHOXAZOLE-TRIMETHOPRIM 800-160 MG PO TABS: 1.0000 | ORAL_TABLET | Freq: Two times a day (BID) | ORAL | 0 refills | Status: DC

## 2018-04-08 MED ORDER — LEVETIRACETAM IN NACL 1000 MG/100ML IV SOLN
1000.0000 mg | Freq: Once | INTRAVENOUS | Status: AC
  Administered 2018-04-08: 1000 mg via INTRAVENOUS
  Filled 2018-04-08: qty 100

## 2018-04-08 NOTE — ED Provider Notes (Signed)
[facility] EMERGENCY DEPARTMENT Provider Note   CSN: [id number] Arrival date & time: 04/08/18  2301     History   Chief Complaint Chief Complaint  Patient presents with  . Seizures    HPI Erin Krueger is a 22 y.o. female.  Patient presents to the ER for evaluation of seizure.  She has a history of seizure disorder since she was a child.  Patient reports that she is [redacted] weeks pregnant.  When she found out she was pregnant, her Keppra was stopped.  She started having increased seizures, Keppra was restarted 1 week ago.  She reports that she has been taking her Keppra twice daily without missing any doses for the last week.  Tonight she had back to back seizures.  Both were self-limited, did have a period of responsiveness between.  Patient is currently being treated for urinary tract infection.  She was initially placed on Keflex for abnormal urinalysis, culture ultimately showed that infection was E. coli that was resistant to Keflex.  Prescription was switched to Bactrim according to culture and sensitivities.  Patient has not had any fever.  She denies nausea, vomiting, diarrhea.  She has not been having any abdominal pain, flank pain.     Past Medical History:  Diagnosis Date  . ADHD (attention deficit hyperactivity disorder)   . Allergy   . Anxiety   . Asthma   . Eating disorder   . Headache(784.0)   . ODD (oppositional defiant disorder)   . PTSD (post-traumatic stress disorder)   . Seizures [facility])    diagnosed age 22    Patient Active Problem List   Diagnosis Date Noted  . Asymptomatic bacteriuria during pregnancy in first trimester 04/08/2018  . Marijuana use 04/07/2018  . Supervision of normal pregnancy 2018-04-11  . Smoker Apr 11, 2018  . Death of child 11-Apr-2018  . 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 PID (acute pelvic inflammatory disease) 01/29/2016  . Seizure disorder (HCC) 01/07/2016  . Seizure (HCC) 01/07/2016  . Conduct disorder, adolescent-onset type  08/09/2013  . PTSD (post-traumatic stress disorder) 05/12/2013  . 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 Polysubstance abuse (HCC) 05/12/2013    Past Surgical History:  Procedure Laterality Date  . TONSILLECTOMY       OB History    Gravida  2   Para  1   Term  1   Preterm      AB      Living  0     SAB      TAB      Ectopic      Multiple      Live Births  1            Home Medications    Prior to Admission medications   Medication Sig Start Date End Date Taking? Authorizing Provider  albuterol (PROVENTIL HFA;VENTOLIN HFA) 108 (90 Base) MCG/ACT inhaler Inhale 2 puffs into the lungs every 6 (six) hours as needed for wheezing or shortness of breath. 04-11-2018   Cheral Marker, CNM  anti-nausea (EMETROL) solution Take 10 mLs by mouth every 15 (fifteen) minutes as needed for nausea or vomiting.    [provider]  EPINEPHrine 0.3 mg/0.3 mL IJ SOAJ injection as needed.  03/16/18   [provider]  levETIRAcetam (KEPPRA) 500 MG tablet Take 2 tablets (1,000 mg total) by mouth 2 (two) times daily. 03/15/18   Doug Sou, MD  Prenatal Vit-Fe Fumarate-FA (PRENATAL MULTIVITAMIN) TABS tablet Take 1 tablet by  mouth daily at 12 noon. 03/04/18   Jacalyn Lefevre, MD  sulfamethoxazole-trimethoprim (BACTRIM DS,SEPTRA DS) 800-160 MG tablet Take 1 tablet by mouth 2 (two) times daily. X 7 days 04/08/18   Cheral Marker, CNM    Family History Family History  Problem Relation Age of Onset  . Diabetes Mother   . Diabetes Sister   . Breast cancer Maternal Grandmother   . Diabetes Maternal Grandmother   . Glaucoma Paternal Grandmother   . Cancer Paternal Grandmother        breast    Social History Social History   Tobacco Use  . Smoking status: Former Smoker    Types: Cigarettes  . Smokeless tobacco: Never Used  Substance Use Topics  . Alcohol use: Not Currently    Comment: 3-4 glassed vodka/beer(not while  pregnant)  . Drug use: Not Currently    Types: Marijuana    Comment: no drug use since pregnant- cocaine last use 2018     Allergies   Bee venom; Penicillins; Cashew nut oil; Cocoa butter; Prednisone; Shellfish allergy; and Latex   Review of Systems Review of Systems  Neurological: Positive for seizures.  All other systems reviewed and are negative.    Physical Exam Updated Vital Signs BP 99/64   Pulse 81   Resp (!) 21   Ht  (1.702 m)   Wt 84.8 kg (187 lb)   LMP 01/26/2018   SpO2 97%   BMI 29.29 kg/m   Physical Exam  Constitutional: She is oriented to person, place, and time. She appears well-developed and well-nourished. No distress.  HENT:  Head: Normocephalic and atraumatic.  Right Ear: Hearing normal.  Left Ear: Hearing normal.  Nose: Nose normal.  Mouth/Throat: Oropharynx is clear and moist and mucous membranes are normal.  Eyes: Pupils are equal, round, and reactive to light. Conjunctivae and EOM are normal.  Neck: Normal range of motion. Neck supple.  Cardiovascular: Regular rhythm, S1 normal and S2 normal. Exam reveals no gallop and no friction rub.  No murmur heard. Pulmonary/Chest: Effort normal and breath sounds normal. No respiratory distress. She exhibits no tenderness.  Abdominal: Soft. Normal appearance and bowel sounds are normal. There is no hepatosplenomegaly. There is no tenderness. There is no rebound, no guarding, no tenderness at McBurney's point and negative Murphy's sign. No hernia.  Musculoskeletal: Normal range of motion.  Neurological: She is alert and oriented to person, place, and time. She has normal strength. No cranial nerve deficit or sensory deficit. Coordination normal. GCS eye subscore is 4. GCS verbal subscore is 5. GCS motor subscore is 6.  Skin: Skin is warm, dry and intact. No rash noted. No cyanosis.  Psychiatric: She has a normal mood and affect. Her speech is normal and behavior is normal. Thought content normal.  Nursing  note and vitals reviewed.    ED Treatments / Results  Labs (all labs ordered are listed, but only abnormal results are displayed) Labs Reviewed  CBC WITH DIFFERENTIAL/PLATELET - Abnormal; Notable for the following components:      Result Value   WBC 13.0 (*)    Neutro Abs 9.1 (*)    All other components within normal limits  BASIC METABOLIC PANEL - Abnormal; Notable for the following components:   Potassium 3.4 (*)    All other components within normal limits  URINALYSIS, ROUTINE W REFLEX MICROSCOPIC  CBG MONITORING, ED    EKG None  Radiology No results found.  Procedures Procedures (including critical care time)  Medications  Ordered in ED Medications  sodium chloride 0.9 % bolus 500 mL (0 mLs Intravenous Stopped 04/09/18 0132)  levETIRAcetam (KEPPRA) IVPB 1000 mg/100 mL premix (0 mg Intravenous Stopped 04/09/18 0026)     Initial Impression / Assessment and Plan / ED Course  I have reviewed the triage vital signs and the nursing notes.  Pertinent labs & imaging results that were available during my care of the patient were reviewed by me and considered in my medical decision making (see chart for details).     Patient presents to the emergency department for evaluation of 2 seizures that occurred tonight.  She has a history of seizure disorder.  Patient reports that she is [redacted] weeks pregnant, had her Keppra stopped when she was diagnosed with pregnancy.  She has had multiple seizures, therefore Keppra was restarted 1 week ago.  She is currently being treated for urinary tract infection.  She was initiated on Keflex, but culture and sensitivity showed resistance, was switched to Bactrim.  Patient has multiple reasons for breakthrough seizure.  She is at her normal neurologic baseline at arrival.  Was given IV Keppra.  Basic labs unremarkable.  I was attempting to recheck her urinalysis to ensure that she is not having worsening infection, but she was unable to give a urine  sample.  At some point her significant other became angry about the way and she decided to leave before giving a urinalysis.  Based on the culture and sensitivity, I suspect that her treatment will be adequate and will not require any further treatment at this time.  Final Clinical Impressions(s) / ED Diagnoses   Final diagnoses:  Seizure [facility])    ED Discharge Orders    None       Lorell Thibodaux, Canary Brim, MD 04/09/18 [id number]

## 2018-04-08 NOTE — Telephone Encounter (Signed)
Pt notified but FOB stated patient was sleeping.

## 2018-04-08 NOTE — Telephone Encounter (Signed)
Informed patient urine cx showed UTI resistant to several antibiotics so septra was sent.  Advised to take all of medications.

## 2018-04-08 NOTE — ED Triage Notes (Signed)
EMS called out for seizures. Pt has history of epilepsy, pt is [redacted] weeks pregnant and was taken off Keppra by Dr in ED here, pt started having seizures back to back, so she came back to ED and Dr told her to start taking Keppra again. Pt has been taking Keppra now for about one week. EMS reports pt boyfriend reported pt had 2-3 seizures back to back with conciseness in between. Pt alert and oriented x 4 upon arrival. Pt has also been taking Bactrim this am for a UTI.

## 2018-04-09 LAB — BASIC METABOLIC PANEL
Anion gap: 7 (ref 5–15)
BUN: 10 mg/dL (ref 6–20)
CO2: 24 mmol/L (ref 22–32)
Calcium: 9 mg/dL (ref 8.9–10.3)
Chloride: 107 mmol/L (ref 101–111)
Creatinine, Ser: 0.62 mg/dL (ref 0.44–1.00)
GFR calc Af Amer: >60 mL/min (ref >60)
GFR calc non Af Amer: >60 mL/min (ref >60)
Glucose, Bld: 92 mg/dL (ref 65–99)
Potassium: 3.4 mmol/L — ABNORMAL LOW (ref 3.5–5.1)
Sodium: 138 mmol/L (ref 135–145)

## 2018-04-09 LAB — CBC WITH DIFFERENTIAL/PLATELET
Basophils Absolute: 0 10*3/uL (ref 0.0–0.1)
Basophils Relative: 0 %
Eosinophils Absolute: 0.1 10*3/uL (ref 0.0–0.7)
Eosinophils Relative: 1 %
HCT: 37.4 % (ref 36.0–46.0)
Hemoglobin: 12.6 g/dL (ref 12.0–15.0)
Lymphocytes Relative: 23 %
Lymphs Abs: 2.9 10*3/uL (ref 0.7–4.0)
MCH: 30.2 pg (ref 26.0–34.0)
MCHC: 33.7 g/dL (ref 30.0–36.0)
MCV: 89.7 fL (ref 78.0–100.0)
Monocytes Absolute: 0.9 10*3/uL (ref 0.1–1.0)
Monocytes Relative: 7 %
Neutro Abs: 9.1 10*3/uL — ABNORMAL HIGH (ref 1.7–7.7)
Neutrophils Relative %: 69 %
Platelets: 227 10*3/uL (ref 150–400)
RBC: 4.17 MIL/uL (ref 3.87–5.11)
RDW: 12.7 % (ref 11.5–15.5)
WBC: 13 10*3/uL — ABNORMAL HIGH (ref 4.0–10.5)

## 2018-04-09 LAB — CBG MONITORING, ED: Glucose-Capillary: 88 mg/dL (ref 65–99)

## 2018-04-09 NOTE — ED Notes (Signed)
Woke pt to request urine sample again, pt states she does not need to urinate at this time

## 2018-04-09 NOTE — ED Notes (Signed)
Pt states she wants to leave AMA- she is "ready to go home and go to bed"

## 2018-04-09 NOTE — ED Notes (Signed)
Notified MD that CBG was 88

## 2018-04-12 LAB — CYSTIC FIBROSIS MUTATION 97: Interpretation: NOT DETECTED

## 2018-04-13 ENCOUNTER — Encounter (HOSPITAL_COMMUNITY): Payer: Self-pay | Admitting: *Deleted

## 2018-04-13 ENCOUNTER — Other Ambulatory Visit: Payer: Self-pay

## 2018-04-13 ENCOUNTER — Emergency Department (HOSPITAL_COMMUNITY)
Admission: EM | Admit: 2018-04-13 | Discharge: 2018-04-13 | Disposition: A | Discharge status: Home / Self Care | Payer: Medicaid Other | Attending: Emergency Medicine | Admitting: Emergency Medicine

## 2018-04-13 DIAGNOSIS — Z87891 Personal history of nicotine dependence: Secondary | ICD-10-CM | POA: Insufficient documentation

## 2018-04-13 DIAGNOSIS — J45909 Unspecified asthma, uncomplicated: Secondary | ICD-10-CM | POA: Diagnosis not present

## 2018-04-13 DIAGNOSIS — O200 Threatened abortion: Secondary | ICD-10-CM | POA: Insufficient documentation

## 2018-04-13 DIAGNOSIS — O99511 Diseases of the respiratory system complicating pregnancy, first trimester: Secondary | ICD-10-CM | POA: Insufficient documentation

## 2018-04-13 DIAGNOSIS — R0789 Other chest pain: Secondary | ICD-10-CM | POA: Diagnosis not present

## 2018-04-13 DIAGNOSIS — Z79899 Other long term (current) drug therapy: Secondary | ICD-10-CM | POA: Diagnosis not present

## 2018-04-13 DIAGNOSIS — O9989 Other specified diseases and conditions complicating pregnancy, childbirth and the puerperium: Secondary | ICD-10-CM | POA: Insufficient documentation

## 2018-04-13 DIAGNOSIS — Z3A11 11 weeks gestation of pregnancy: Secondary | ICD-10-CM | POA: Diagnosis not present

## 2018-04-13 DIAGNOSIS — Z91040 Latex allergy status: Secondary | ICD-10-CM | POA: Diagnosis not present

## 2018-04-13 DIAGNOSIS — O209 Hemorrhage in early pregnancy, unspecified: Secondary | ICD-10-CM | POA: Diagnosis present

## 2018-04-13 MED ORDER — RANITIDINE HCL 150 MG PO TABS: 150.0000 mg | ORAL_TABLET | Freq: Two times a day (BID) | ORAL | 0 refills | Status: DC

## 2018-04-13 NOTE — ED Provider Notes (Signed)
[facility] EMERGENCY DEPARTMENT Provider Note   CSN: [id number] Arrival date & time: 04/13/18  2040     History   Chief Complaint Chief Complaint  Patient presents with  . Chest Pain    HPI Erin Krueger is a 22 y.o. female.  HPI  22 year old female, she presents at approximately [redacted] weeks gestation, presents with a c/o chest pain - she has been having chest discomfort for the last several weeks.  She describes this as a burning sensation in her chest, it was initially much similar to her prior acid reflux disease but over the recent week she has had some associated sharp chest pain with it as well.  She states that it comes and goes, it is not associated with anything specific except sometimes when she takes of breath, sometimes when she lays on her right side but it is better when she lays on her left side.  At this time she is chest pain-free, she also complains of having some abdominal injury today when she was in the river, there was some [place] and she was trying to get out of the [place] slipped on a rock and struck the right side of her abdomen.  She noted that she had some bright red vaginal bleeding immediately afterwards which has slowed down and is now just a small amount of spotting.  Her abdomen is feeling much better.  She has had her prenatal care with Dr. Christin Bach, she is aware that she has an intrauterine pregnancy, she is on a prenatal vitamin.  She is also taking Keppra for seizure disorder and is well controlled.  This is her second pregnancy, the first was complicated by postdelivery seizures which was thought to either be related to her seizure disorder or eclampsia but either way she has been well controlled since that time.  Past Medical History:  Diagnosis Date  . ADHD (attention deficit hyperactivity disorder)   . Allergy   . Anxiety   . Asthma   . Eating disorder   . Headache(784.0)   . ODD (oppositional defiant disorder)   . PTSD (post-traumatic  stress disorder)   . Seizures [facility])    diagnosed age 19    Patient Active Problem List   Diagnosis Date Noted  . Asymptomatic bacteriuria during pregnancy in first trimester 04/08/2018  . Marijuana use 04/07/2018  . Supervision of normal pregnancy 04/23/2018  . Smoker 04/23/18  . Death of child 04-23-2018  . 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 PID (acute pelvic inflammatory disease) 01/29/2016  . Seizure disorder (HCC) 01/07/2016  . Seizure (HCC) 01/07/2016  . 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 PTSD (post-traumatic stress disorder) 05/12/2013  . 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 Polysubstance abuse (HCC) 05/12/2013    Past Surgical History:  Procedure Laterality Date  . TONSILLECTOMY       OB History    Gravida  2   Para  1   Term  1   Preterm      AB      Living  0     SAB      TAB      Ectopic      Multiple      Live Births  1            Home Medications    Prior to Admission medications   Medication Sig Start Date End Date Taking? Authorizing Provider  albuterol (PROVENTIL HFA;VENTOLIN HFA) 108 (90 Base) MCG/ACT inhaler  Inhale 2 puffs into the lungs every 6 (six) hours as needed for wheezing or shortness of breath. 04/05/18   Cheral Marker, CNM  anti-nausea (EMETROL) solution Take 10 mLs by mouth every 15 (fifteen) minutes as needed for nausea or vomiting.    [provider]  EPINEPHrine 0.3 mg/0.3 mL IJ SOAJ injection as needed.  03/16/18   [provider]  levETIRAcetam (KEPPRA) 500 MG tablet Take 2 tablets (1,000 mg total) by mouth 2 (two) times daily. 03/15/18   Doug Sou, MD  Prenatal Vit-Fe Fumarate-FA (PRENATAL MULTIVITAMIN) TABS tablet Take 1 tablet by mouth daily at 12 noon. 03/04/18   Jacalyn Lefevre, MD  ranitidine (ZANTAC) 150 MG tablet Take 1 tablet (150 mg total) by mouth 2 (two) times daily. 04/13/18   Eber Hong, MD  sulfamethoxazole-trimethoprim  (BACTRIM DS,SEPTRA DS) 800-160 MG tablet Take 1 tablet by mouth 2 (two) times daily. X 7 days 04/08/18   Cheral Marker, CNM    Family History Family History  Problem Relation Age of Onset  . Diabetes Mother   . Diabetes Sister   . Breast cancer Maternal Grandmother   . Diabetes Maternal Grandmother   . Glaucoma Paternal Grandmother   . Cancer Paternal Grandmother        breast    Social History Social History   Tobacco Use  . Smoking status: Former Smoker    Types: Cigarettes  . Smokeless tobacco: Never Used  Substance Use Topics  . Alcohol use: Not Currently    Comment: 3-4 glassed vodka/beer(not while pregnant)  . Drug use: Not Currently    Types: Marijuana    Comment: no drug use since pregnant- cocaine last use 2018     Allergies   Bee venom; Penicillins; Cashew nut oil; Cocoa butter; Prednisone; Shellfish allergy; and Latex   Review of Systems Review of Systems  All other systems reviewed and are negative.    Physical Exam Updated Vital Signs BP 110/77 (BP Location: Right Arm)   Pulse 100   Temp 98.2 F (36.8 C) (Oral)   Resp 20   Ht  (1.702 m)   Wt 84.8 kg (187 lb)   LMP 01/26/2018   SpO2 99%   BMI 29.29 kg/m   Physical Exam  Constitutional: She appears well-developed and well-nourished. No distress.  HENT:  Head: Normocephalic and atraumatic.  Mouth/Throat: Oropharynx is clear and moist. No oropharyngeal exudate.  Eyes: Pupils are equal, round, and reactive to light. Conjunctivae and EOM are normal. Right eye exhibits no discharge. Left eye exhibits no discharge. No scleral icterus.  Neck: Normal range of motion. Neck supple. No JVD present. No thyromegaly present.  Cardiovascular: Normal rate, regular rhythm, normal heart sounds and intact distal pulses. Exam reveals no gallop and no friction rub.  No murmur heard. Pulmonary/Chest: Effort normal and breath sounds normal. No respiratory distress. She has no wheezes. She has no rales.    Abdominal: Soft. Bowel sounds are normal. She exhibits no distension and no mass. There is no tenderness.  Musculoskeletal: Normal range of motion. She exhibits no edema or tenderness.  Lymphadenopathy:    She has no cervical adenopathy.  Neurological: She is alert. Coordination normal.  Skin: Skin is warm and dry. No rash noted. No erythema.  Psychiatric: She has a normal mood and affect. Her behavior is normal.  Nursing note and vitals reviewed.    ED Treatments / Results  Labs (all labs ordered are listed, but only abnormal results are displayed)  Labs Reviewed - No data to display  EKG EKG Interpretation  Date/Time:  Wednesday Apr 13 2018 21:26:22 EDT Ventricular Rate:  84 PR Interval:    QRS Duration: 94 QT Interval:  355 QTC Calculation: 420 R Axis:   84 Text Interpretation:  Sinus rhythm Borderline Q waves in inferior leads Borderline T abnormalities, anterior leads since last tracing no significant change Confirmed by Eber Hong ([id number]) on 04/13/2018 9:53:00 PM   Radiology No results found.  Procedures Procedures (including critical care time)  Medications Ordered in ED Medications - No data to display   Initial Impression / Assessment and Plan / ED Course  I have reviewed the triage vital signs and the nursing notes.  Pertinent labs & imaging results that were available during my care of the patient were reviewed by me and considered in my medical decision making (see chart for details).     Patient's abdomen is rather benign, she has no reducible tenderness, her chest and back exams are normal, normal lungs, normal heart.  EKG to rule out any cardiac causes of pain though her exam being normal I suggest this is probably just acid reflux.  She is low risk for pulmonary embolism.  Her vaginal bleeding is all but stopped and at this point I done a bedside ultrasound showing a normal intrauterine fetus with normal heart tones, this appears consistent with her  gestational age that she reports.  I do not think that doing a pelvic exam would add any value to the exam as I do not think she has a vaginal injury but rather probably had some threatened miscarriage.  She is previable.  She will follow-up with your family doctor, I think this is appropriate at this time.  EKG unremarkable and unchanged Pt give reassurance, ] Ranitidine is preg category B, Pt agreeable.  Final Clinical Impressions(s) / ED Diagnoses   Final diagnoses:  Burning chest pain  Threatened miscarriage    ED Discharge Orders        Ordered    ranitidine (ZANTAC) 150 MG tablet  2 times daily     04/13/18 2152       Eber Hong, MD 04/13/18 2153

## 2018-04-13 NOTE — Discharge Instructions (Signed)
Testing shows no signs of heart attack.  You should start taking ranitidine twice a day for the next 2 weeks then once a day after that  Please follow-up with your family doctor or your OB/GYN within 2 weeks for recheck.  If you have increased bleeding or pain in your abdomen you may have a miscarriage.  Please call your OB/GYN for close follow-up regarding this however your baby looks good on the ultrasound here in the emergency department.  Continue taking Tylenol for pain as needed and your prenatal vitamin

## 2018-04-13 NOTE — ED Triage Notes (Addendum)
Pt reports chest pain intermittently x 1 week. Pt states she is [redacted] weeks pregnant and went swimming in the river today and hit her stomach. Pt states when she got out of the water, she noticed bright red blood running down her leg. Pt states the bleeding has stopped but is reporting abdominal pain as well.

## 2018-04-18 ENCOUNTER — Encounter (HOSPITAL_COMMUNITY): Payer: Self-pay

## 2018-04-18 ENCOUNTER — Other Ambulatory Visit: Payer: Self-pay

## 2018-04-18 ENCOUNTER — Emergency Department (HOSPITAL_COMMUNITY)
Admission: EM | Admit: 2018-04-18 | Discharge: 2018-04-18 | Disposition: A | Discharge status: Home / Self Care | Payer: Medicaid Other | Attending: Emergency Medicine | Admitting: Emergency Medicine

## 2018-04-18 DIAGNOSIS — Z91040 Latex allergy status: Secondary | ICD-10-CM | POA: Diagnosis not present

## 2018-04-18 DIAGNOSIS — O9A211 Injury, poisoning and certain other consequences of external causes complicating pregnancy, first trimester: Secondary | ICD-10-CM | POA: Insufficient documentation

## 2018-04-18 DIAGNOSIS — W57XXXA Bitten or stung by nonvenomous insect and other nonvenomous arthropods, initial encounter: Secondary | ICD-10-CM | POA: Insufficient documentation

## 2018-04-18 DIAGNOSIS — O9952 Diseases of the respiratory system complicating childbirth: Secondary | ICD-10-CM | POA: Insufficient documentation

## 2018-04-18 DIAGNOSIS — O99712 Diseases of the skin and subcutaneous tissue complicating pregnancy, second trimester: Secondary | ICD-10-CM | POA: Insufficient documentation

## 2018-04-18 DIAGNOSIS — Y998 Other external cause status: Secondary | ICD-10-CM | POA: Diagnosis not present

## 2018-04-18 DIAGNOSIS — F902 Attention-deficit hyperactivity disorder, combined type: Secondary | ICD-10-CM | POA: Insufficient documentation

## 2018-04-18 DIAGNOSIS — Z79899 Other long term (current) drug therapy: Secondary | ICD-10-CM | POA: Insufficient documentation

## 2018-04-18 DIAGNOSIS — S30860A Insect bite (nonvenomous) of lower back and pelvis, initial encounter: Secondary | ICD-10-CM | POA: Insufficient documentation

## 2018-04-18 DIAGNOSIS — Y9389 Activity, other specified: Secondary | ICD-10-CM | POA: Diagnosis not present

## 2018-04-18 DIAGNOSIS — F419 Anxiety disorder, unspecified: Secondary | ICD-10-CM | POA: Diagnosis not present

## 2018-04-18 DIAGNOSIS — F1590 Other stimulant use, unspecified, uncomplicated: Secondary | ICD-10-CM | POA: Insufficient documentation

## 2018-04-18 DIAGNOSIS — O99321 Drug use complicating pregnancy, first trimester: Secondary | ICD-10-CM | POA: Insufficient documentation

## 2018-04-18 DIAGNOSIS — Y929 Unspecified place or not applicable: Secondary | ICD-10-CM | POA: Insufficient documentation

## 2018-04-18 DIAGNOSIS — T7840XA Allergy, unspecified, initial encounter: Secondary | ICD-10-CM | POA: Insufficient documentation

## 2018-04-18 DIAGNOSIS — Z3A12 12 weeks gestation of pregnancy: Secondary | ICD-10-CM | POA: Insufficient documentation

## 2018-04-18 DIAGNOSIS — Z87891 Personal history of nicotine dependence: Secondary | ICD-10-CM | POA: Insufficient documentation

## 2018-04-18 DIAGNOSIS — T63441A Toxic effect of venom of bees, accidental (unintentional), initial encounter: Secondary | ICD-10-CM

## 2018-04-18 DIAGNOSIS — J45909 Unspecified asthma, uncomplicated: Secondary | ICD-10-CM | POA: Diagnosis not present

## 2018-04-18 DIAGNOSIS — O99341 Other mental disorders complicating pregnancy, first trimester: Secondary | ICD-10-CM | POA: Diagnosis not present

## 2018-04-18 DIAGNOSIS — F913 Oppositional defiant disorder: Secondary | ICD-10-CM | POA: Insufficient documentation

## 2018-04-18 LAB — I-STAT BETA HCG BLOOD, ED (MC, WL, AP ONLY): I-stat hCG, quantitative: >2000 m[IU]/mL — ABNORMAL HIGH (ref <5)

## 2018-04-18 MED ORDER — PREDNISONE 10 MG PO TABS: 40.0000 mg | ORAL_TABLET | Freq: Every day | ORAL | 0 refills | Status: DC

## 2018-04-18 MED ORDER — EPINEPHRINE 0.3 MG/0.3ML IJ SOAJ: 0.3000 mg | Freq: Once | INTRAMUSCULAR | 2 refills | Status: AC

## 2018-04-18 NOTE — ED Notes (Signed)
FHR is 161

## 2018-04-18 NOTE — ED Provider Notes (Signed)
[facility] EMERGENCY DEPARTMENT Provider Note   CSN: [id number] Arrival date & time: 04/18/18  1608     History   Chief Complaint Chief Complaint  Patient presents with  . Insect Bite    HPI Erin Krueger is a 22 y.o. female.  Patient [redacted] weeks pregnant followed by [facility].  With stung by a bee left thigh buttocks area patient has a known bee allergy.  Start to get tightness in the throat was getting lightheaded patient friend gave her EpiPen.  Patient denies any abdominal pain or vaginal bleeding.  Was seen recently after a potential injury to the abdomen from [place].  That has resolved.  Patient fetal heart tones here in the 160s.  Patient claims to get Benadryl at home and that she is on prednisone already started by OB/GYN but not clear why that would be the case.  Patient's has no symptoms now.  Just sore where the be stung her.     Past Medical History:  Diagnosis Date  . ADHD (attention deficit hyperactivity disorder)   . Allergy   . Anxiety   . Asthma   . Eating disorder   . Headache(784.0)   . ODD (oppositional defiant disorder)   . PTSD (post-traumatic stress disorder)   . Seizures [facility])    diagnosed age 45    Patient Active Problem List   Diagnosis Date Noted  . Asymptomatic bacteriuria during pregnancy in first trimester 04/08/2018  . Marijuana use 04/07/2018  . Supervision of normal pregnancy 05-02-2018  . Smoker 2018/05/02  . Death of child May 02, 2018  . 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 PID (acute pelvic inflammatory disease) 01/29/2016  . Seizure disorder (HCC) 01/07/2016  . Seizure (HCC) 01/07/2016  . 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 PTSD (post-traumatic stress disorder) 05/12/2013  . 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 Polysubstance abuse (HCC) 05/12/2013    Past Surgical History:  Procedure Laterality Date  . TONSILLECTOMY       OB History    Gravida  2   Para  1   Term  1   Preterm      AB      Living  0     SAB      TAB      Ectopic      Multiple      Live Births  1            Home Medications    Prior to Admission medications   Medication Sig Start Date End Date Taking? Authorizing Provider  albuterol (PROVENTIL HFA;VENTOLIN HFA) 108 (90 Base) MCG/ACT inhaler Inhale 2 puffs into the lungs every 6 (six) hours as needed for wheezing or shortness of breath. 05/02/2018  Yes Cheral Marker, CNM  EPINEPHrine 0.3 mg/0.3 mL IJ SOAJ injection 0.3 mg as needed.  03/16/18  Yes [provider]  levETIRAcetam (KEPPRA) 500 MG tablet Take 2 tablets (1,000 mg total) by mouth 2 (two) times daily. 03/15/18  Yes Doug Sou, MD  Prenatal Vit-Fe Fumarate-FA (PRENATAL MULTIVITAMIN) TABS tablet Take 1 tablet by mouth daily at 12 noon. 03/04/18  Yes Jacalyn Lefevre, MD  ranitidine (ZANTAC) 150 MG tablet Take 1 tablet (150 mg total) by mouth 2 (two) times daily. Patient taking differently: Take 150 mg by mouth 2 (two) times daily as needed for heartburn.  04/13/18  Yes Eber Hong, MD  sulfamethoxazole-trimethoprim (BACTRIM DS,SEPTRA DS) 800-160 MG tablet Take 1 tablet  by mouth 2 (two) times daily. X 7 days 04/08/18  Yes Cheral Marker, CNM  EPINEPHrine (EPIPEN 2-PAK) 0.3 mg/0.3 mL IJ SOAJ injection Inject 0.3 mLs (0.3 mg total) into the muscle once for 1 dose. 04/18/18 04/18/18  Vanetta Mulders, MD  predniSONE (DELTASONE) 10 MG tablet Take 4 tablets (40 mg total) by mouth daily. 04/18/18   Vanetta Mulders, MD    Family History Family History  Problem Relation Age of Onset  . Diabetes Mother   . Diabetes Sister   . Breast cancer Maternal Grandmother   . Diabetes Maternal Grandmother   . Glaucoma Paternal Grandmother   . Cancer Paternal Grandmother        breast    Social History Social History   Tobacco Use  . Smoking status: Former Smoker    Types: Cigarettes  . Smokeless tobacco: Never Used    Substance Use Topics  . Alcohol use: Not Currently    Comment: 3-4 glassed vodka/beer(not while pregnant)  . Drug use: Not Currently    Types: Marijuana    Comment: no drug use since pregnant- cocaine last use 2018     Allergies   Bee venom; Penicillins; Cashew nut oil; Prednisone; Cocoa butter; Latex; and Shellfish allergy   Review of Systems Review of Systems  Constitutional: Negative for fever.  HENT: Positive for trouble swallowing. Negative for congestion and voice change.   Eyes: Negative for visual disturbance.  Respiratory: Negative for shortness of breath and wheezing.   Cardiovascular: Negative for chest pain.  Gastrointestinal: Negative for abdominal pain, nausea and vomiting.  Genitourinary: Negative for vaginal bleeding.  Musculoskeletal: Negative for neck stiffness.  Skin: Negative for rash.  Neurological: Positive for light-headedness.  Hematological: Does not bruise/bleed easily.  Psychiatric/Behavioral: Negative for confusion.     Physical Exam Updated Vital Signs BP (!) 108/92   Pulse 77   Temp 98.3 F (36.8 C) (Oral)   Resp 16   Wt 84.8 kg (187 lb)   LMP 01/26/2018   SpO2 99%   BMI 29.29 kg/m   Physical Exam  Constitutional: She is oriented to person, place, and time. She appears well-developed and well-nourished. No distress.  HENT:  Head: Normocephalic and atraumatic.  Mouth/Throat: Oropharynx is clear and moist.  Eyes: Pupils are equal, round, and reactive to light. Conjunctivae and EOM are normal.  Neck: Normal range of motion. Neck supple.  Cardiovascular: Normal rate, regular rhythm and normal heart sounds.  Pulmonary/Chest: Effort normal and breath sounds normal. No respiratory distress. She has no wheezes.  Abdominal: Soft. Bowel sounds are normal. There is no tenderness.  Genitourinary: Vagina normal and uterus normal.  Musculoskeletal: Normal range of motion.  Left buttocks area with a 3 mm area of redness no retained stinger no  significant swelling.  Neurological: She is alert and oriented to person, place, and time. No cranial nerve deficit or sensory deficit. She exhibits normal muscle tone. Coordination normal.  Skin: Skin is warm. Capillary refill takes less than 2 seconds. No rash noted. No erythema.  Nursing note and vitals reviewed.    ED Treatments / Results  Labs (all labs ordered are listed, but only abnormal results are displayed) Labs Reviewed  I-STAT BETA HCG BLOOD, ED ([facility], [facility], [facility] ONLY) - Abnormal; Notable for the following components:      Result Value   I-stat hCG, quantitative >2,000.0 (*)    All other components within normal limits    EKG None  Radiology No results found.  Procedures  Procedures (including critical care time)  Medications Ordered in ED Medications - No data to display   Initial Impression / Assessment and Plan / ED Course  I have reviewed the triage vital signs and the nursing notes.  Pertinent labs & imaging results that were available during my care of the patient were reviewed by me and considered in my medical decision making (see chart for details).     Fetal heart tones about 160.  Patient approximately [redacted] weeks pregnant.  Has had altered IUP done by [facility].  Following the bee sting patient did get tight in the throat.  She used her EpiPen.  Patient has Benadryl at home.   Patient has prednisone listed as an allergy claims it caused a nosebleed.  The patient pretty sure she is taking it for allergic reactions in the past.  Patient given 5-day course of prednisone she thinks she has prednisone that she is already taking prescribed by OB/GYN but I am not clear why.  Either way she will continue prednisone for the next 5 days.  And she will take Benadryl for the next 24 hours.  Patient nontoxic no acute distress.  Asymptomatic currently other than soreness where the bee stung her.  Her EpiPen was also renewed.  Final Clinical Impressions(s) / ED  Diagnoses   Final diagnoses:  Bee sting, accidental or unintentional, initial encounter  Allergic reaction, initial encounter  [redacted] weeks gestation of pregnancy    ED Discharge Orders        Ordered    predniSONE (DELTASONE) 10 MG tablet  Daily     04/18/18 1721    EPINEPHrine (EPIPEN 2-PAK) 0.3 mg/0.3 mL IJ SOAJ injection   Once     04/18/18 1721       Vanetta Mulders, MD 04/18/18 1730

## 2018-04-18 NOTE — ED Triage Notes (Signed)
Pt was stung by a bee on left buttocks today. Is allergic and used her Epi Pen in left thigh. Upon EMS arrival pt was hyperventilating due to nervousness. All VSS. NAD. Pt is [redacted] weeks pregnant.

## 2018-04-18 NOTE — Discharge Instructions (Signed)
Your EpiPen is been renewed.  Recommend taking Benadryl for the next 24 hours.  And continuing prednisone for 5 days.  Make an appointment to follow-up with your OB/GYN.  Return for any new or worse symptoms.

## 2018-04-21 ENCOUNTER — Telehealth: Payer: Self-pay | Admitting: *Deleted

## 2018-04-21 NOTE — Telephone Encounter (Signed)
LMOVM f/u with patient from after hours call to nurse last night.

## 2018-04-26 ENCOUNTER — Ambulatory Visit (INDEPENDENT_AMBULATORY_CARE_PROVIDER_SITE_OTHER): Payer: Medicaid Other

## 2018-04-26 ENCOUNTER — Ambulatory Visit (INDEPENDENT_AMBULATORY_CARE_PROVIDER_SITE_OTHER): Payer: Medicaid Other | Admitting: Advanced Practice Midwife

## 2018-04-26 ENCOUNTER — Other Ambulatory Visit: Payer: Self-pay

## 2018-04-26 ENCOUNTER — Encounter: Payer: Self-pay | Admitting: Advanced Practice Midwife

## 2018-04-26 VITALS — BP 112/71 | HR 87 | Wt 181.0 lb

## 2018-04-26 DIAGNOSIS — O99331 Smoking (tobacco) complicating pregnancy, first trimester: Secondary | ICD-10-CM

## 2018-04-26 DIAGNOSIS — Z3481 Encounter for supervision of other normal pregnancy, first trimester: Secondary | ICD-10-CM

## 2018-04-26 DIAGNOSIS — O99321 Drug use complicating pregnancy, first trimester: Secondary | ICD-10-CM | POA: Diagnosis not present

## 2018-04-26 DIAGNOSIS — F17210 Nicotine dependence, cigarettes, uncomplicated: Secondary | ICD-10-CM

## 2018-04-26 DIAGNOSIS — O99341 Other mental disorders complicating pregnancy, first trimester: Secondary | ICD-10-CM | POA: Diagnosis not present

## 2018-04-26 DIAGNOSIS — Z3A12 12 weeks gestation of pregnancy: Secondary | ICD-10-CM | POA: Diagnosis not present

## 2018-04-26 DIAGNOSIS — O9989 Other specified diseases and conditions complicating pregnancy, childbirth and the puerperium: Secondary | ICD-10-CM

## 2018-04-26 DIAGNOSIS — Z1389 Encounter for screening for other disorder: Secondary | ICD-10-CM

## 2018-04-26 DIAGNOSIS — F4310 Post-traumatic stress disorder, unspecified: Secondary | ICD-10-CM | POA: Diagnosis not present

## 2018-04-26 DIAGNOSIS — R110 Nausea: Secondary | ICD-10-CM

## 2018-04-26 DIAGNOSIS — F1210 Cannabis abuse, uncomplicated: Secondary | ICD-10-CM

## 2018-04-26 DIAGNOSIS — Z87440 Personal history of urinary (tract) infections: Secondary | ICD-10-CM

## 2018-04-26 DIAGNOSIS — Z3682 Encounter for antenatal screening for nuchal translucency: Secondary | ICD-10-CM | POA: Diagnosis not present

## 2018-04-26 DIAGNOSIS — Z331 Pregnant state, incidental: Secondary | ICD-10-CM

## 2018-04-26 DIAGNOSIS — F1190 Opioid use, unspecified, uncomplicated: Secondary | ICD-10-CM | POA: Diagnosis not present

## 2018-04-26 DIAGNOSIS — F909 Attention-deficit hyperactivity disorder, unspecified type: Secondary | ICD-10-CM | POA: Diagnosis not present

## 2018-04-26 LAB — POCT URINALYSIS DIPSTICK
Blood, UA: NEGATIVE
Glucose, UA: NEGATIVE
Ketones, UA: NEGATIVE
Nitrite, UA: NEGATIVE
Protein, UA: POSITIVE — AB

## 2018-04-26 MED ORDER — PRENATAL MULTIVITAMIN CH: 1.0000 | ORAL_TABLET | Freq: Every day | ORAL | 11 refills | Status: DC

## 2018-04-26 MED ORDER — PNV PRENATAL PLUS MULTIVITAMIN 27-1 MG PO TABS: 1.0000 | ORAL_TABLET | Freq: Every day | ORAL | 11 refills | Status: DC

## 2018-04-26 NOTE — Progress Notes (Signed)
US 12+6 wks,measurements c/w dates,crl 66.16 mm,normal ovaries bilat,NB present,NT 1.5 mm,anterior pl gr 0

## 2018-04-26 NOTE — Progress Notes (Signed)
  G2P1000 [id number] Estimated Date of Delivery: 11/02/18  Blood pressure 112/71, pulse 87, weight 181 lb (82.1 kg), last menstrual period 01/26/2018, unknown if currently breastfeeding.   BP weight and urine results all reviewed and noted.  Please refer to the obstetrical flow sheet for the fundal height and fetal heart rate documentation:  Patient denies any bleeding and no rupture of membranes symptoms or regular contractions. Patient still having nausea, OK for zofran.  UTI sx are gone.  All questions were answered.   Physical Assessment:   Vitals:   04/26/18 1434  BP: 112/71  Pulse: 87  Weight: 181 lb (82.1 kg)  Body mass index is 28.35 kg/m.        Physical Examination:   General appearance: Well appearing, and in no distress  Mental status: Alert, oriented to person, place, and time  Skin: Warm & dry  Cardiovascular: Normal heart rate noted  Respiratory: Normal respiratory effort, no distress  Abdomen: Soft, gravid, nontender  Pelvic:          Extremities: Edema: Trace  Fetal Status: Fetal Heart Rate (bpm): +us       US 12+6 wks,measurements c/w dates,crl 66.16 mm,normal ovaries bilat,NB present,NT 1.5 mm,anterior pl gr 0    Results for orders placed or performed in visit on 04/26/18 (from the past 24 hour(s))  POCT urinalysis dipstick   Collection Time: 04/26/18  2:36 PM  Result Value Ref Range   Color, UA     Clarity, UA     Glucose, UA Negative Negative   Bilirubin, UA     Ketones, UA neg    Spec Grav, UA  1.010 - 1.025   Blood, UA neg    pH, UA  5.0 - 8.0   Protein, UA Positive (A) Negative   Urobilinogen, UA  0.2 or 1.0 E.U./dL   Nitrite, UA neg    Leukocytes, UA Small (1+) (A) Negative   Appearance     Odor       Orders Placed This Encounter  Procedures  . Urine Culture  . US OB Comp + 14 Wk  . Integrated 1  . POCT urinalysis dipstick    Plan:  Continued routine obstetrical care,   Return for 4 weeks for LROB;/2nd IT; 5 weeks for anatomy scan  only.

## 2018-04-28 ENCOUNTER — Other Ambulatory Visit: Payer: Self-pay | Admitting: Advanced Practice Midwife

## 2018-04-28 ENCOUNTER — Encounter (INDEPENDENT_AMBULATORY_CARE_PROVIDER_SITE_OTHER): Payer: Self-pay

## 2018-04-28 LAB — INTEGRATED 1
Crown Rump Length: 66.2 mm
Gest. Age on Collection Date: 12.7 weeks
Maternal Age at EDD: 21.6 yr
Nuchal Translucency (NT): 1.5 mm
Number of Fetuses: 1
PAPP-A Value: 687.8 ng/mL
Weight: 181 [lb_av]

## 2018-04-28 LAB — URINE CULTURE

## 2018-04-28 MED ORDER — NITROFURANTOIN MONOHYD MACRO 100 MG PO CAPS: 100.0000 mg | ORAL_CAPSULE | Freq: Two times a day (BID) | ORAL | 0 refills | Status: DC

## 2018-04-28 NOTE — Progress Notes (Signed)
macrobid for UTI (different organism than before)

## 2018-05-05 ENCOUNTER — Other Ambulatory Visit: Payer: Self-pay

## 2018-05-05 ENCOUNTER — Encounter (HOSPITAL_COMMUNITY): Payer: Self-pay | Admitting: Emergency Medicine

## 2018-05-05 ENCOUNTER — Emergency Department (HOSPITAL_COMMUNITY)
Admission: EM | Admit: 2018-05-05 | Discharge: 2018-05-05 | Disposition: A | Discharge status: Home / Self Care | Payer: Medicaid Other | Attending: Emergency Medicine | Admitting: Emergency Medicine

## 2018-05-05 DIAGNOSIS — R51 Headache: Secondary | ICD-10-CM | POA: Diagnosis present

## 2018-05-05 DIAGNOSIS — Z79899 Other long term (current) drug therapy: Secondary | ICD-10-CM | POA: Diagnosis not present

## 2018-05-05 DIAGNOSIS — J45909 Unspecified asthma, uncomplicated: Secondary | ICD-10-CM | POA: Insufficient documentation

## 2018-05-05 DIAGNOSIS — G43009 Migraine without aura, not intractable, without status migrainosus: Secondary | ICD-10-CM | POA: Insufficient documentation

## 2018-05-05 DIAGNOSIS — Z91040 Latex allergy status: Secondary | ICD-10-CM | POA: Diagnosis not present

## 2018-05-05 DIAGNOSIS — Z87891 Personal history of nicotine dependence: Secondary | ICD-10-CM | POA: Diagnosis not present

## 2018-05-05 HISTORY — DX: Migraine, unspecified, not intractable, without status migrainosus: G43.909

## 2018-05-05 LAB — URINALYSIS, ROUTINE W REFLEX MICROSCOPIC
Bilirubin Urine: NEGATIVE
Glucose, UA: NEGATIVE mg/dL
Hgb urine dipstick: NEGATIVE
Ketones, ur: 5 mg/dL — AB
Nitrite: NEGATIVE
Protein, ur: NEGATIVE mg/dL
Specific Gravity, Urine: 1.03 (ref 1.005–1.030)
pH: 6 (ref 5.0–8.0)

## 2018-05-05 MED ORDER — CEPHALEXIN 500 MG PO CAPS
500.0000 mg | ORAL_CAPSULE | Freq: Once | ORAL | Status: AC
  Administered 2018-05-05: 500 mg via ORAL
  Filled 2018-05-05: qty 1

## 2018-05-05 MED ORDER — CEPHALEXIN 500 MG PO CAPS: 500.0000 mg | ORAL_CAPSULE | Freq: Four times a day (QID) | ORAL | 0 refills | Status: AC

## 2018-05-05 MED ORDER — SODIUM CHLORIDE 0.9 % IV BOLUS
1000.0000 mL | Freq: Once | INTRAVENOUS | Status: AC
  Administered 2018-05-05: 1000 mL via INTRAVENOUS

## 2018-05-05 MED ORDER — METOCLOPRAMIDE HCL 5 MG/ML IJ SOLN
10.0000 mg | Freq: Once | INTRAMUSCULAR | Status: AC
  Administered 2018-05-05: 10 mg via INTRAVENOUS
  Filled 2018-05-05: qty 2

## 2018-05-05 NOTE — ED Provider Notes (Signed)
[facility] EMERGENCY DEPARTMENT Provider Note   CSN: [id number] Arrival date & time: 05/05/18  [id number]     History   Chief Complaint Chief Complaint  Patient presents with  . Headache    HPI Erin BertholdWillow Krueger is a 22 y.o. female with a past medical history of migraines, asthma, anxiety, who presents to ED for evaluation of headache that began yesterday.  She states that it feels similar to her prior migraines but is not alleviated with Tylenol.  She took 3 doses yesterday.  She also reports intermittent nausea and one episode of nonbloody, nonbilious emesis.  She also reports lower abdominal cramping feeling like "my UTIs back."  She recently finished her course of antibiotics for a UTI.  She is unsure of the name of the antibiotic.  However, she states that "my infection was immune so I had to use a different antibiotic."  She denies any injuries or falls, vision changes, swelling of hands or feet, prior current history of high blood pressure associated with pregnancy, numbness in arms or legs, fever, neck pain.  HPI  Past Medical History:  Diagnosis Date  . ADHD (attention deficit hyperactivity disorder)   . Allergy   . Anxiety   . Asthma   . Eating disorder   . Headache(784.0)   . Migraines   . ODD (oppositional defiant disorder)   . PTSD (post-traumatic stress disorder)   . Seizures [facility](HCC)    diagnosed age 89    Patient Active Problem List   Diagnosis Date Noted  . Asymptomatic bacteriuria during pregnancy in first trimester 04/08/2018  . Marijuana use 04/07/2018  . Supervision of normal pregnancy 04/05/2018  . Smoker 04/05/2018  . Death of child 04/05/2018  . 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 PID (acute pelvic inflammatory disease) 01/29/2016  . Seizure disorder (HCC) 01/07/2016  . Seizure (HCC) 01/07/2016  . 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 PTSD (post-traumatic stress disorder) 05/12/2013  . ADHD (attention deficit hyperactivity disorder), combined type  05/12/2013  . Polysubstance abuse (HCC) 05/12/2013    Past Surgical History:  Procedure Laterality Date  . TONSILLECTOMY       OB History    Gravida  2   Para  1   Term  1   Preterm      AB      Living  0     SAB      TAB      Ectopic      Multiple      Live Births  1            Home Medications    Prior to Admission medications   Medication Sig Start Date End Date Taking? Authorizing Provider  albuterol (PROVENTIL HFA;VENTOLIN HFA) 108 (90 Base) MCG/ACT inhaler Inhale 2 puffs into the lungs every 6 (six) hours as needed for wheezing or shortness of breath. 04/05/18  Yes Cheral MarkerBooker, Kimberly R, CNM  EPINEPHrine 0.3 mg/0.3 mL IJ SOAJ injection 0.3 mg as needed.  03/16/18  Yes [provider]  levETIRAcetam (KEPPRA) 500 MG tablet Take 2 tablets (1,000 mg total) by mouth 2 (two) times daily. 03/15/18  Yes Doug SouJacubowitz, Sam, MD  Prenatal Vit-Fe Fumarate-FA (PRENATAL MULTIVITAMIN) TABS tablet Take 1 tablet by mouth daily at 12 noon. 04/26/18  Yes Cresenzo-Dishmon, Scarlette CalicoFrances, CNM  cephALEXin (KEFLEX) 500 MG capsule Take 1 capsule (500 mg total) by mouth 4 (four) times daily for 7 days. 05/05/18 05/12/18  Alper Guilmette, PA-C  nitrofurantoin, macrocrystal-monohydrate, (MACROBID) 100 MG capsule  Take 1 capsule (100 mg total) by mouth 2 (two) times daily. Patient not taking: Reported on 05/05/2018 04/28/18   Cresenzo-Dishmon, Scarlette Calico, CNM  ranitidine (ZANTAC) 150 MG tablet Take 1 tablet (150 mg total) by mouth 2 (two) times daily. Patient not taking: Reported on 05/05/2018 04/13/18   Eber Hong, MD    Family History Family History  Problem Relation Age of Onset  . Diabetes Mother   . Diabetes Sister   . Breast cancer Maternal Grandmother   . Diabetes Maternal Grandmother   . Glaucoma Paternal Grandmother   . Cancer Paternal Grandmother        breast    Social History Social History   Tobacco Use  . Smoking status: Former Smoker    Types: Cigarettes  . Smokeless  tobacco: Never Used  Substance Use Topics  . Alcohol use: Not Currently    Comment: 3-4 glassed vodka/beer(not while pregnant)  . Drug use: Not Currently    Types: Marijuana    Comment: no drug use since pregnant- cocaine last use 2018     Allergies   Bee venom; Penicillins; Cashew nut oil; Prednisone; Cocoa butter; Latex; and Shellfish allergy   Review of Systems Review of Systems  Constitutional: Negative for appetite change, chills and fever.  HENT: Negative for ear pain, rhinorrhea, sneezing and sore throat.   Eyes: Positive for photophobia. Negative for visual disturbance.  Respiratory: Negative for cough, chest tightness, shortness of breath and wheezing.   Cardiovascular: Negative for chest pain and palpitations.  Gastrointestinal: Positive for nausea and vomiting. Negative for abdominal pain, blood in stool, constipation and diarrhea.  Genitourinary: Positive for dysuria. Negative for hematuria and urgency.  Musculoskeletal: Negative for myalgias.  Skin: Negative for rash.  Neurological: Positive for headaches. Negative for dizziness, weakness and light-headedness.     Physical Exam Updated Vital Signs BP 102/61   Pulse 75   Temp 98 F (36.7 C) (Oral)   Resp 17   LMP 01/26/2018   SpO2 100%   Physical Exam  Constitutional: She is oriented to person, place, and time. She appears well-developed and well-nourished. No distress.  HENT:  Head: Normocephalic and atraumatic.  Nose: Nose normal.  Eyes: Pupils are equal, round, and reactive to light. Conjunctivae and EOM are normal. Right eye exhibits no discharge. Left eye exhibits no discharge. No scleral icterus.  Neck: Normal range of motion. Neck supple.  No meningismus.  Cardiovascular: Normal rate, regular rhythm, normal heart sounds and intact distal pulses. Exam reveals no gallop and no friction rub.  No murmur heard. Pulmonary/Chest: Effort normal and breath sounds normal. No respiratory distress.  Abdominal:  Soft. Bowel sounds are normal. She exhibits no distension. There is no tenderness. There is no guarding.  Musculoskeletal: Normal range of motion. She exhibits no edema.  Neurological: She is alert and oriented to person, place, and time. No cranial nerve deficit or sensory deficit. She exhibits normal muscle tone. Coordination normal.  Pupils reactive. No facial asymmetry noted. Cranial nerves appear grossly intact. Sensation intact to light touch on face, BUE and BLE. Strength 5/5 in BUE and BLE.  Skin: Skin is warm and dry. No rash noted.  Psychiatric: She has a normal mood and affect.  Nursing note and vitals reviewed.    ED Treatments / Results  Labs (all labs ordered are listed, but only abnormal results are displayed) Labs Reviewed  URINALYSIS, ROUTINE W REFLEX MICROSCOPIC - Abnormal; Notable for the following components:      Result Value  Color, Urine AMBER (*)    APPearance HAZY (*)    Ketones, ur 5 (*)    Leukocytes, UA MODERATE (*)    Bacteria, UA MANY (*)    All other components within normal limits  URINE CULTURE    EKG None  Radiology No results found.  Procedures Procedures (including critical care time)  Medications Ordered in ED Medications  metoCLOPramide (REGLAN) injection 10 mg (10 mg Intravenous Given 05/05/18 1055)  sodium chloride 0.9 % bolus 1,000 mL (0 mLs Intravenous Stopped 05/05/18 1140)  cephALEXin (KEFLEX) capsule 500 mg (500 mg Oral Given 05/05/18 1048)     Initial Impression / Assessment and Plan / ED Course  I have reviewed the triage vital signs and the nursing notes.  Pertinent labs & imaging results that were available during my care of the patient were reviewed by me and considered in my medical decision making (see chart for details).     Patient presents to ED for evaluation of migraine headaches since yesterday.  She had a history of migraines and states that this feels similar but is not improved with Tylenol.  She is currently  [redacted] weeks pregnant.  She last had an OB appointment last week.  She recently finished a course of Macrobid for UTI but feels like her UTIs back.  On physical exam she is overall well-appearing.  No deficits on neurological exam noted.  No abdominal tenderness to palpation.  Urinalysis with evidence of UTI.  Will send for culture.  Will begin patient on Keflex.  Headache improved with Reglan and fluids.  Patient states that she needs to leave because her boyfriend is her only ride home and he needs to leave for an appointment.  Will give rx for remainder of Keflex course.  Portions of this note were generated with Scientist, clinical (histocompatibility and immunogenetics). Dictation errors may occur despite best attempts at proofreading.   Final Clinical Impressions(s) / ED Diagnoses   Final diagnoses:  Migraine without aura and without status migrainosus, not intractable    ED Discharge Orders        Ordered    cephALEXin (KEFLEX) 500 MG capsule  4 times daily     05/05/18 1145       Dietrich Pates, PA-C 05/05/18 1147    Raeford Razor, MD 05/06/18 1356

## 2018-05-05 NOTE — ED Notes (Signed)
Pt has called nurse bell x 2 wanted note to be sent to court house. Note sent to Tally Joearin Allen at [phone]

## 2018-05-05 NOTE — ED Notes (Signed)
Pt's boyfriend coming out of room saying pt wants to leave AMA. RN went into room and pt agreed that yes she wanted to leave AMA due to her boyfriend having a doctor's appt that he needed to get to. As RN was getting pt ready to leave AMA, PA walked in and is aware that pt wants to leave [facility]

## 2018-05-05 NOTE — ED Notes (Signed)
Pt was given dc paperwork and rx as walking out. nad

## 2018-05-05 NOTE — ED Notes (Addendum)
IV removed from right hand. IV catheter tip in place. DD&I.

## 2018-05-05 NOTE — ED Notes (Signed)
Patient requesting something for headache. EDPa made aware, going to room to assess patient.

## 2018-05-05 NOTE — ED Triage Notes (Signed)
Pt c/o "migraine" yesterday to left side of head. Woke up this am with pain to right side. Had some dried blood in underwear this am and states she is [redacted] weeks pregnant. Pt missed court date today

## 2018-05-07 LAB — URINE CULTURE: Culture: >=100000 — AB

## 2018-05-08 ENCOUNTER — Telehealth: Payer: Self-pay

## 2018-05-08 NOTE — Progress Notes (Signed)
ED Antimicrobial Stewardship Positive Culture Follow Up   Erin BertholdWillow Krueger is an 22 y.o. female who [facility]05[facility]2018 with a chief complaint of  Chief Complaint  Patient presents with  . Headache    Recent Results (from the past 720 hour(s))  Urine Culture     Status: Abnormal   Collection Time: 04/26/18  4:00 PM  Result Value Ref Range Status   Urine Culture, Routine Final report (A)  Final   Organism ID, Bacteria Enterococcus faecalis (A)  Final    Comment: Greater than 100,000 colony forming units per mL Note: this isolate is vancomycin-susceptible. This information is provided for epidemiologic purposes only: vancomycin is not among the antibiotics recommended for therapy of urinary tract infections caused by Enterococcus.    Antimicrobial Susceptibility Comment  Final    Comment:       ** S = Susceptible; I = Intermediate; R = Resistant **                    P = Positive; N = Negative             MICS are expressed in micrograms per mL    Antibiotic                 RSLT#1    RSLT#2    RSLT#3    RSLT#4 Ciprofloxacin                  S Levofloxacin                   S Nitrofurantoin                 S Penicillin                     S Tetracycline                   S Vancomycin                     S   Urine culture     Status: Abnormal   Collection Time: 05/05/18  9:50 AM  Result Value Ref Range Status   Specimen Description   Final    URINE, CLEAN CATCH Performed at [facility], [address] [place], [place] [address]    Special Requests   Final    NONE Performed at [facility], [address] [place], [place] [address]    Culture (A)  Final    >=100,000 COLONIES/mL ESCHERICHIA COLI Confirmed Extended Spectrum Beta-Lactamase Producer (ESBL).  In bloodstream infections from ESBL organisms, carbapenems are preferred over piperacillin/tazobactam. They are shown to have a lower risk of mortality. Performed at [facility], [address]  [place], [place] [address]    Report Status 05/07/2018 FINAL  Final   Organism ID, Bacteria ESCHERICHIA COLI (A)  Final      Susceptibility   Escherichia coli - MIC*    AMPICILLIN >=32 RESISTANT Resistant     CEFAZOLIN >=64 RESISTANT Resistant     CEFTRIAXONE RESISTANT Resistant     CIPROFLOXACIN >=4 RESISTANT Resistant     GENTAMICIN <=1 SENSITIVE Sensitive     IMIPENEM <=0.25 SENSITIVE Sensitive     NITROFURANTOIN <=16 SENSITIVE Sensitive     TRIMETH/SULFA <=20 SENSITIVE Sensitive     AMPICILLIN/SULBACTAM >=32 RESISTANT Resistant     PIP/TAZO <=4 SENSITIVE Sensitive     Extended  ESBL POSITIVE Resistant     * >=100,000 COLONIES/mL ESCHERICHIA COLI    [x]  Treated with cephalexin, organism resistant to prescribed antimicrobial []  Patient discharged originally without antimicrobial agent and treatment is now indicated  New antibiotic prescription: DC cephalexin, start bactrim DS 1 tablet PO BID x 7 days  ED Provider: Sharen Heck, PA   [name], Drake Leach 05/08/2018, 10:40 AM Clinical Pharmacist Phone# [phone]

## 2018-05-08 NOTE — Telephone Encounter (Signed)
Post ED Visit - Positive Culture Follow-up: Unsuccessful Patient Follow-up  Culture assessed and recommendations reviewed by:  []  Enzo BiNathan Batchelder, Pharm.D. []  Celedonio MiyamotoJeremy Frens, [address] BCPS AQ-ID []  Garvin FilaMike Maccia, Pharm.D., BCPS []  Georgina PillionElizabeth Martin, [address]Ph[address] BCPS []  [place] Pham, [address]Ph[address]D., BCPS, AAHIVP []  Estella HuskMichelle Turner, Pharm.D., BCPS, AAHIVP []  Sherlynn CarbonAustin Lucas, PharmD []  Pollyann SamplesAndy Johnston, PharmD, BCPS Fleet Contrasachel Rumbarger Pharm D Positive urine culture  []  Patient discharged without antimicrobial prescription and treatment is now indicated [x]  Organism is resistant to prescribed ED discharge antimicrobial []  Patient with positive blood cultures   Unable to contact patient after 3 attempts, letter will be sent to address on file  Jerry CarasCullom, Tandra Rosado Burnett 05/08/2018, 11:25 AM

## 2018-05-13 NOTE — Telephone Encounter (Signed)
LMOVM to make sure she picked up and took antibiotic for UTI.

## 2018-05-19 ENCOUNTER — Emergency Department (HOSPITAL_COMMUNITY)
Admission: EM | Admit: 2018-05-19 | Discharge: 2018-05-19 | Disposition: A | Discharge status: Home / Self Care | Payer: Medicaid Other | Attending: Emergency Medicine | Admitting: Emergency Medicine

## 2018-05-19 ENCOUNTER — Other Ambulatory Visit: Payer: Self-pay

## 2018-05-19 ENCOUNTER — Encounter (HOSPITAL_COMMUNITY): Payer: Self-pay | Admitting: *Deleted

## 2018-05-19 DIAGNOSIS — N3000 Acute cystitis without hematuria: Secondary | ICD-10-CM | POA: Diagnosis not present

## 2018-05-19 DIAGNOSIS — Z79899 Other long term (current) drug therapy: Secondary | ICD-10-CM | POA: Diagnosis not present

## 2018-05-19 DIAGNOSIS — R21 Rash and other nonspecific skin eruption: Secondary | ICD-10-CM | POA: Insufficient documentation

## 2018-05-19 MED ORDER — NITROFURANTOIN MONOHYD MACRO 100 MG PO CAPS: 100.0000 mg | ORAL_CAPSULE | Freq: Two times a day (BID) | ORAL | 0 refills | Status: DC

## 2018-05-19 NOTE — ED Triage Notes (Signed)
Pt c/o rash to her lower back and right elbow x 2 days; pt states she is still having abdominal pain and cramping after finishing her antibiotic today for a uti

## 2018-05-19 NOTE — Discharge Instructions (Addendum)
Use hydrocortisone 1% as needed on rash. You can get this over the counter.

## 2018-05-23 NOTE — ED Provider Notes (Signed)
[facility] EMERGENCY DEPARTMENT Provider Note   CSN: [id number] Arrival date & time: 05/19/18  2033     History   Chief Complaint Chief Complaint  Patient presents with  . Rash    HPI Erin Krueger is a 22 y.o. female.  HPI   22 year old female with rash.  Noticed on bilateral forearms and small area on her leg as well.  Very itchy.  No pain.  No fevers.  No new contacts that she is aware of.  She is still complaining of some intermittent crampy lower abdominal pain.  Recent diagnoses of urinary tract infections.  She reports that she did not receive recent phone calls or letter to notify her of need for new antibiotic.  Past Medical History:  Diagnosis Date  . ADHD (attention deficit hyperactivity disorder)   . Allergy   . Anxiety   . Asthma   . Eating disorder   . Headache(784.0)   . Migraines   . ODD (oppositional defiant disorder)   . PTSD (post-traumatic stress disorder)   . Seizures [facility])    diagnosed age 59    Patient Active Problem List   Diagnosis Date Noted  . Asymptomatic bacteriuria during pregnancy in first trimester 04/08/2018  . Marijuana use 04/07/2018  . Supervision of normal pregnancy 04/24/18  . Smoker 2018/04/24  . Death of child April 24, 2018  . 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 PID (acute pelvic inflammatory disease) 01/29/2016  . Seizure disorder (HCC) 01/07/2016  . Seizure (HCC) 01/07/2016  . 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 PTSD (post-traumatic stress disorder) 05/12/2013  . 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 Polysubstance abuse (HCC) 05/12/2013    Past Surgical History:  Procedure Laterality Date  . TONSILLECTOMY       OB History    Gravida  2   Para  1   Term  1   Preterm      AB      Living  0     SAB      TAB      Ectopic      Multiple      Live Births  1            Home Medications    Prior to Admission medications   Medication Sig Start  Date End Date Taking? Authorizing Provider  albuterol (PROVENTIL HFA;VENTOLIN HFA) 108 (90 Base) MCG/ACT inhaler Inhale 2 puffs into the lungs every 6 (six) hours as needed for wheezing or shortness of breath. 04-24-2018  Yes Cheral Marker, CNM  Aspirin-Salicylamide-Caffeine (BC HEADACHE) 325-95-16 MG TABS Take 0.25 packets by mouth once as needed (for migraine relief).   Yes [provider]  EPINEPHrine 0.3 mg/0.3 mL IJ SOAJ injection Inject 0.3 mg into the muscle as needed.  03/16/18  Yes [provider]  ibuprofen (ADVIL,MOTRIN) 200 MG tablet Take 400 mg by mouth once as needed.   Yes [provider]  levETIRAcetam (KEPPRA) 500 MG tablet Take 2 tablets (1,000 mg total) by mouth 2 (two) times daily. 03/15/18  Yes Doug Sou, MD  Prenatal Vit-Fe Fumarate-FA (PRENATAL MULTIVITAMIN) TABS tablet Take 1 tablet by mouth daily at 12 noon. 04/26/18  Yes Cresenzo-Dishmon, Scarlette Calico, CNM  ranitidine (ZANTAC) 150 MG tablet Take 1 tablet (150 mg total) by mouth 2 (two) times daily. Patient taking differently: Take 150 mg by mouth daily as needed for heartburn.  04/13/18  Yes Eber Hong, MD  nitrofurantoin, macrocrystal-monohydrate, (MACROBID) 100 MG capsule Take 1  capsule (100 mg total) by mouth 2 (two) times daily. Patient not taking: Reported on 05/05/2018 04/28/18   Cresenzo-Dishmon, Scarlette Calico, CNM  nitrofurantoin, macrocrystal-monohydrate, (MACROBID) 100 MG capsule Take 1 capsule (100 mg total) by mouth 2 (two) times daily. 05/19/18   Raeford Razor, MD    Family History Family History  Problem Relation Age of Onset  . Diabetes Mother   . Diabetes Sister   . Breast cancer Maternal Grandmother   . Diabetes Maternal Grandmother   . Glaucoma Paternal Grandmother   . Cancer Paternal Grandmother        breast    Social History Social History   Tobacco Use  . Smoking status: Former Smoker    Types: Cigarettes  . Smokeless tobacco: Never Used  Substance Use Topics  .  Alcohol use: Not Currently    Comment: 3-4 glassed vodka/beer(not while pregnant)  . Drug use: Not Currently    Types: Marijuana    Comment: no drug use since pregnant- cocaine last use 2018     Allergies   Bee venom; Cashew nut oil; Penicillins; Prednisone; Cocoa butter; Latex; and Shellfish allergy   Review of Systems Review of Systems All systems reviewed and negative, other than as noted in HPI.   Physical Exam Updated Vital Signs BP 130/85 (BP Location: Left Arm)   Pulse 70   Temp 98.7 F (37.1 C) (Oral)   Resp 18   Ht 5\' 7"  (1.702 m)   Wt 86.2 kg (190 lb)   LMP 01/26/2018   SpO2 95%   BMI 29.76 kg/m   Physical Exam  Constitutional: She appears well-developed and well-nourished. No distress.  HENT:  Head: Normocephalic and atraumatic.  Eyes: Conjunctivae are normal. Right eye exhibits no discharge. Left eye exhibits no discharge.  Neck: Neck supple.  Cardiovascular: Normal rate, regular rhythm and normal heart sounds. Exam reveals no gallop and no friction rub.  No murmur heard. Pulmonary/Chest: Effort normal and breath sounds normal. No respiratory distress.  Abdominal: Soft. She exhibits no distension. There is no tenderness.  Musculoskeletal: She exhibits no edema or tenderness.  Neurological: She is alert.  Skin: Skin is warm and dry. Rash noted.  Sparse erythematous rash.  Tiny papules.  Seem to be associated with follicles.  No pustules.  No cellulitis.  Slightly raised.  Nontender.  Psychiatric: She has a normal mood and affect. Her behavior is normal. Thought content normal.  Nursing note and vitals reviewed.    ED Treatments / Results  Labs (all labs ordered are listed, but only abnormal results are displayed) Labs Reviewed  URINE CULTURE    EKG None  Radiology No results found.  Procedures Procedures (including critical care time)  Medications Ordered in ED Medications - No data to display   Initial Impression / Assessment and Plan  / ED Course  I have reviewed the triage vital signs and the nursing notes.  Pertinent labs & imaging results that were available during my care of the patient were reviewed by me and considered in my medical decision making (see chart for details).    22 year old female with rash.  Nonspecific.  Possibly mild folliculitis.  No cellulitic component.  She is also continued suprapubic pain.  Recent urinary tract infections.  She reports that she did not receive the recent phone calls for letter concerning the need for new antibiotic.  She was prescribed macrobid today.   Final Clinical Impressions(s) / ED Diagnoses   Final diagnoses:  Acute cystitis without hematuria  Rash    ED Discharge Orders        Ordered    nitrofurantoin, macrocrystal-monohydrate, (MACROBID) 100 MG capsule  2 times daily     05/19/18 2210       Raeford RazorKohut, Acen Craun, MD 05/23/18 1745

## 2018-05-24 ENCOUNTER — Ambulatory Visit (INDEPENDENT_AMBULATORY_CARE_PROVIDER_SITE_OTHER): Payer: Medicaid Other | Admitting: Advanced Practice Midwife

## 2018-05-24 ENCOUNTER — Encounter: Payer: Self-pay | Admitting: Advanced Practice Midwife

## 2018-05-24 VITALS — BP 117/82 | Temp 98.4°F | Wt 182.0 lb

## 2018-05-24 DIAGNOSIS — Z3482 Encounter for supervision of other normal pregnancy, second trimester: Secondary | ICD-10-CM

## 2018-05-24 DIAGNOSIS — Z331 Pregnant state, incidental: Secondary | ICD-10-CM

## 2018-05-24 DIAGNOSIS — Z3A16 16 weeks gestation of pregnancy: Secondary | ICD-10-CM

## 2018-05-24 DIAGNOSIS — Z1389 Encounter for screening for other disorder: Secondary | ICD-10-CM

## 2018-05-24 DIAGNOSIS — Z1379 Encounter for other screening for genetic and chromosomal anomalies: Secondary | ICD-10-CM

## 2018-05-24 LAB — POCT URINALYSIS DIPSTICK
Blood, UA: NEGATIVE
Glucose, UA: NEGATIVE
Ketones, UA: NEGATIVE
Leukocytes, UA: NEGATIVE
Protein, UA: POSITIVE — AB

## 2018-05-24 NOTE — Progress Notes (Signed)
LOW-RISK PREGNANCY VISIT Patient name: Erin BertholdWillow Krueger MRN [id number]  Date of birth: December 28, 1995 Chief Complaint:   low risk ob (2nd IT)  History of Present Illness:   Erin MerlWillow Laural Krueger is a 22 y.o. G2P1000 female at 4248w6d with an Estimated Date of Delivery: 11/02/18 being seen today for ongoing management of a low-risk pregnancy. She has had 2 urine cx + ESBL, last treated on 6/13 w/septra, but pt didn't get that med and was rx'd macrobid on 6/27 Today she reports no complaints.  .  .  Movement: Absent. denies leaking of fluid. Review of Systems:   Pertinent items are noted in HPI Denies abnormal vaginal discharge w/ itching/odor/irritation, headaches, visual changes, shortness of breath, chest pain, abdominal pain, severe nausea/vomiting, or problems with urination or bowel movements unless otherwise stated above.  Pertinent History Reviewed:  Medical & Surgical Hx:   Past Medical History:  Diagnosis Date  . ADHD (attention deficit hyperactivity disorder)   . Allergy   . Anxiety   . Asthma   . Eating disorder   . Headache(784.0)   . Migraines   . ODD (oppositional defiant disorder)   . PTSD (post-traumatic stress disorder)   . Seizures (HCC)    diagnosed age 17   Past Surgical History:  Procedure Laterality Date  . TONSILLECTOMY    . TONSILLECTOMY AND ADENOIDECTOMY     Family History  Problem Relation Age of Onset  . Diabetes Mother   . Diabetes Sister   . Breast cancer Maternal Grandmother   . Diabetes Maternal Grandmother   . Glaucoma Maternal Grandmother     Current Outpatient Medications:  .  albuterol (PROVENTIL HFA;VENTOLIN HFA) 108 (90 Base) MCG/ACT inhaler, Inhale 2 puffs into the lungs every 6 (six) hours as needed for wheezing or shortness of breath., Disp: 1 Inhaler, Rfl: 2 .  levETIRAcetam (KEPPRA) 500 MG tablet, Take 2 tablets (1,000 mg total) by mouth 2 (two) times daily., Disp: 120 tablet, Rfl: 0 .  nitrofurantoin, macrocrystal-monohydrate, (MACROBID) 100 MG  capsule, Take 1 capsule (100 mg total) by mouth 2 (two) times daily., Disp: 14 capsule, Rfl: 0 .  Prenatal Vit-Fe Fumarate-FA (PRENATAL MULTIVITAMIN) TABS tablet, Take 1 tablet by mouth daily at 12 noon., Disp: 30 tablet, Rfl: 11 .  Aspirin-Salicylamide-Caffeine (BC HEADACHE) 325-95-16 MG TABS, Take 0.25 packets by mouth once as needed (for migraine relief)., Disp: , Rfl:  .  EPINEPHrine 0.3 mg/0.3 mL IJ SOAJ injection, Inject 0.3 mg into the muscle as needed. , Disp: , Rfl: 0 .  ibuprofen (ADVIL,MOTRIN) 200 MG tablet, Take 400 mg by mouth once as needed., Disp: , Rfl:  .  nitrofurantoin, macrocrystal-monohydrate, (MACROBID) 100 MG capsule, Take 1 capsule (100 mg total) by mouth 2 (two) times daily. (Patient not taking: Reported on 05/24/2018), Disp: 14 capsule, Rfl: 0 .  ranitidine (ZANTAC) 150 MG tablet, Take 1 tablet (150 mg total) by mouth 2 (two) times daily. (Patient not taking: Reported on 05/24/2018), Disp: 60 tablet, Rfl: 0 Social History: Reviewed -  reports that she has quit smoking. Her smoking use included cigarettes. She has never used smokeless tobacco.  Physical Assessment:   Vitals:   05/24/18 1451  BP: 117/82  Temp: 98.4 F (36.9 C)  Weight: 182 lb (82.6 kg)  Body mass index is 28.51 kg/m.        Physical Examination:   General appearance: Well appearing, and in no distress  Mental status: Alert, oriented to person, place, and time  Skin: Warm & dry  Cardiovascular: Normal heart rate noted  Respiratory: Normal respiratory effort, no distress  Abdomen: Soft, gravid, nontender  Pelvic: Cervical exam deferred         Extremities: Edema: None  Fetal Status: Fetal Heart Rate (bpm): 156   Movement: Absent    Results for orders placed or performed in visit on 05/24/18 (from the past 24 hour(s))  POCT urinalysis dipstick   Collection Time: 05/24/18  2:55 PM  Result Value Ref Range   Color, UA     Clarity, UA     Glucose, UA Negative Negative   Bilirubin, UA     Ketones,  UA neg    Spec Grav, UA  1.010 - 1.025   Blood, UA neg    pH, UA  5.0 - 8.0   Protein, UA Positive (A) Negative   Urobilinogen, UA  0.2 or 1.0 E.U./dL   Nitrite, UA postive    Leukocytes, UA Negative Negative   Appearance     Odor      Assessment & Plan:  1) Low-risk pregnancy G2P1000 at [redacted]w[redacted]d with an Estimated Date of Delivery: 11/02/18   2) UTI, ESBL, just got macrobid yesterday; and TOC next visit   Labs/procedures/US today: 2nd IT  Plan:  Continue routine obstetrical care    Follow-up: Return for 7/9 as scheduled for anatomy scan; 4 weeks LROB.  Orders Placed This Encounter  Procedures  . INTEGRATED 2  . POCT urinalysis dipstick   Jacklyn Shell CNM 05/24/2018 3:31 PM

## 2018-05-26 LAB — INTEGRATED 2
AFP MoM: 1.35
Alpha-Fetoprotein: 39.6 ng/mL
Crown Rump Length: 66.2 mm
DIA MoM: 1.98
DIA Value: 301.9 pg/mL
Estriol, Unconjugated: 0.77 ng/mL
Gest. Age on Collection Date: 12.7 weeks
Gestational Age: 16.7 weeks
Maternal Age at EDD: 21.6 yr
Nuchal Translucency (NT): 1.5 mm
Nuchal Translucency MoM: 1.01
Number of Fetuses: 1
PAPP-A MoM: 0.86
PAPP-A Value: 687.8 ng/mL
Test Results:: NEGATIVE
Weight: 181 [lb_av]
Weight: 181 [lb_av]
hCG MoM: 0.81
hCG Value: 22.9 IU/mL
uE3 MoM: 0.85

## 2018-05-30 ENCOUNTER — Other Ambulatory Visit: Payer: Self-pay | Admitting: Advanced Practice Midwife

## 2018-05-30 DIAGNOSIS — Z363 Encounter for antenatal screening for malformations: Secondary | ICD-10-CM

## 2018-05-30 DIAGNOSIS — Z3481 Encounter for supervision of other normal pregnancy, first trimester: Secondary | ICD-10-CM

## 2018-05-31 ENCOUNTER — Ambulatory Visit (INDEPENDENT_AMBULATORY_CARE_PROVIDER_SITE_OTHER): Payer: Medicaid Other

## 2018-05-31 ENCOUNTER — Other Ambulatory Visit: Payer: Self-pay | Admitting: Women's Health

## 2018-05-31 DIAGNOSIS — Z3402 Encounter for supervision of normal first pregnancy, second trimester: Secondary | ICD-10-CM

## 2018-05-31 DIAGNOSIS — Z363 Encounter for antenatal screening for malformations: Secondary | ICD-10-CM | POA: Diagnosis not present

## 2018-05-31 DIAGNOSIS — Z3481 Encounter for supervision of other normal pregnancy, first trimester: Secondary | ICD-10-CM

## 2018-05-31 MED ORDER — EPINEPHRINE 0.3 MG/0.3ML IJ SOAJ: 0.3000 mg | INTRAMUSCULAR | 0 refills | Status: DC | PRN

## 2018-05-31 MED ORDER — LEVETIRACETAM 500 MG PO TABS: 1000.0000 mg | ORAL_TABLET | Freq: Two times a day (BID) | ORAL | 2 refills | Status: DC

## 2018-05-31 NOTE — Progress Notes (Signed)
US 17+6 wks,cephalic,anterior pl gr 0,normal ovaries bilat,svp of fluid 5.5 cm,fhr 166 bpm,limited view of heart,LVEICF 2.9 mm,cx 4.6 cm,EFW 222 g 56%,pt will come back for additional images w/next doctors visit,per Selena BattenKim

## 2018-06-14 ENCOUNTER — Other Ambulatory Visit: Payer: Self-pay | Admitting: Women's Health

## 2018-06-20 ENCOUNTER — Telehealth: Payer: Self-pay | Admitting: Obstetrics & Gynecology

## 2018-06-20 ENCOUNTER — Other Ambulatory Visit (HOSPITAL_COMMUNITY): Payer: Self-pay | Admitting: Advanced Practice Midwife

## 2018-06-20 DIAGNOSIS — IMO0002 Reserved for concepts with insufficient information to code with codable children: Secondary | ICD-10-CM

## 2018-06-20 DIAGNOSIS — Z0489 Encounter for examination and observation for other specified reasons: Secondary | ICD-10-CM

## 2018-06-20 NOTE — Telephone Encounter (Signed)
Patient states she experienced a seizure las night.  She has been taking Keppra daily but took at later than normal last night.  Informed patient to keep f/u appt tomorrow.

## 2018-06-20 NOTE — Telephone Encounter (Signed)
Patient called stating that she is Preg and she has an appointment with us tomorrow but pt had a seizure last night and EMS told her to give us a call letting us know. Please contact pt

## 2018-06-21 ENCOUNTER — Encounter: Payer: Self-pay | Admitting: *Deleted

## 2018-06-21 ENCOUNTER — Ambulatory Visit (INDEPENDENT_AMBULATORY_CARE_PROVIDER_SITE_OTHER): Payer: Medicaid Other | Admitting: Obstetrics & Gynecology

## 2018-06-21 ENCOUNTER — Other Ambulatory Visit: Payer: Self-pay

## 2018-06-21 ENCOUNTER — Ambulatory Visit (INDEPENDENT_AMBULATORY_CARE_PROVIDER_SITE_OTHER): Payer: Medicaid Other

## 2018-06-21 ENCOUNTER — Encounter: Payer: Self-pay | Admitting: Obstetrics & Gynecology

## 2018-06-21 VITALS — BP 115/72 | HR 74 | Wt 176.0 lb

## 2018-06-21 DIAGNOSIS — Z3402 Encounter for supervision of normal first pregnancy, second trimester: Secondary | ICD-10-CM

## 2018-06-21 DIAGNOSIS — Z3482 Encounter for supervision of other normal pregnancy, second trimester: Secondary | ICD-10-CM

## 2018-06-21 DIAGNOSIS — Z1389 Encounter for screening for other disorder: Secondary | ICD-10-CM

## 2018-06-21 DIAGNOSIS — IMO0002 Reserved for concepts with insufficient information to code with codable children: Secondary | ICD-10-CM

## 2018-06-21 DIAGNOSIS — Z0489 Encounter for examination and observation for other specified reasons: Secondary | ICD-10-CM | POA: Diagnosis not present

## 2018-06-21 DIAGNOSIS — Z331 Pregnant state, incidental: Secondary | ICD-10-CM

## 2018-06-21 DIAGNOSIS — Z3A20 20 weeks gestation of pregnancy: Secondary | ICD-10-CM

## 2018-06-21 NOTE — Progress Notes (Signed)
US 20+6 wks,cephalic,cx 3.6 cm,anterior pl gr 0,normal ovaries bilat,LVEICF 2.6 mm,fhr 159 bpm,svp of fluid 5.4 cm,EFW 380 g 43%,anatomy complete

## 2018-06-21 NOTE — Progress Notes (Signed)
G2P1000 4858w6d Estimated Date of Delivery: 11/02/18  Blood pressure 115/72, pulse 74, weight 176 lb (79.8 kg), last menstrual period 01/26/2018, unknown if currently breastfeeding.   BP weight and urine results all reviewed and noted.  Please refer to the obstetrical flow sheet for the fundal height and fetal heart rate documentation:  Patient reports good fetal movement, denies any bleeding and no rupture of membranes symptoms or regular contractions. Patient is without complaints. All questions were answered.  No orders of the defined types were placed in this encounter.   Plan:  Continued routine obstetrical care, sonogram is reviewed report is done, isolated EICF is noted, no other anomalies, with history of heart anomaly, ECHO at 26-28 weeks  Return in about 1 month (around 07/19/2018) for LROB, with Dr Despina HiddenEure.

## 2018-06-27 ENCOUNTER — Other Ambulatory Visit: Payer: Self-pay | Admitting: *Deleted

## 2018-06-27 MED ORDER — EPINEPHRINE 0.3 MG/0.3ML IJ SOAJ: 0.3000 mg | INTRAMUSCULAR | 0 refills | Status: DC | PRN

## 2018-07-14 ENCOUNTER — Encounter: Payer: Medicaid Other | Admitting: Advanced Practice Midwife

## 2018-07-15 ENCOUNTER — Encounter: Payer: Medicaid Other | Admitting: Obstetrics & Gynecology

## 2018-07-19 ENCOUNTER — Encounter: Payer: Medicaid Other | Admitting: Obstetrics and Gynecology

## 2018-07-28 ENCOUNTER — Ambulatory Visit (INDEPENDENT_AMBULATORY_CARE_PROVIDER_SITE_OTHER): Payer: Medicaid Other | Admitting: Obstetrics and Gynecology

## 2018-07-28 ENCOUNTER — Encounter: Payer: Self-pay | Admitting: Obstetrics and Gynecology

## 2018-07-28 VITALS — BP 126/84 | HR 78 | Wt 175.4 lb

## 2018-07-28 DIAGNOSIS — Z3A26 26 weeks gestation of pregnancy: Secondary | ICD-10-CM

## 2018-07-28 DIAGNOSIS — Z331 Pregnant state, incidental: Secondary | ICD-10-CM

## 2018-07-28 DIAGNOSIS — R569 Unspecified convulsions: Secondary | ICD-10-CM

## 2018-07-28 DIAGNOSIS — Z3482 Encounter for supervision of other normal pregnancy, second trimester: Secondary | ICD-10-CM

## 2018-07-28 DIAGNOSIS — Z1389 Encounter for screening for other disorder: Secondary | ICD-10-CM

## 2018-07-28 DIAGNOSIS — F445 Conversion disorder with seizures or convulsions: Secondary | ICD-10-CM

## 2018-07-28 LAB — POCT URINALYSIS DIPSTICK OB
Blood, UA: NEGATIVE
Glucose, UA: NEGATIVE
Ketones, UA: NEGATIVE
Nitrite, UA: POSITIVE

## 2018-07-28 NOTE — Progress Notes (Signed)
Patient ID: Erin Krueger, female   DOB: 06/30/1996, 22 y.o.   MRN: [id number]   LOW-RISK PREGNANCY VISIT Patient name: Erin Krueger MRN [id number]  Date of birth: 11/08/1996 Chief Complaint:   Routine Prenatal Visit  History of Present Illness:   Erin Krueger is a 22 y.o. 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 [facility] is here to see her today. Her last child had a heart defect and passed away at ~51mo. She does not know a lot about her family hx. She is concerned because she has been losing weight throughout this pregnancy.    . Vag. Bleeding: None.  Movement: Present. denies leaking of fluid. Review of Systems:   Pertinent items are noted in HPI Denies abnormal vaginal discharge w/ itching/odor/irritation, headaches, visual changes, shortness of breath, chest pain, abdominal pain, severe nausea/vomiting, or problems with urination or bowel movements unless otherwise stated above. Pertinent History Reviewed:  Reviewed past medical,surgical, social, obstetrical and family history.  Reviewed problem list, medications and allergies. Physical Assessment:   Vitals:   07/28/18 1338  BP: 126/84  Pulse: 78  Weight: 175 lb 6.4 oz (79.6 kg)  Body mass index is 27.47 kg/m.        Physical Examination:   General appearance: Well appearing, and in no distress  Mental status: Alert, oriented to person, place, and time  Skin: Warm & dry  Cardiovascular: Normal heart rate noted  Respiratory: Normal respiratory effort, no distress  Abdomen: Soft, gravid, nontender  Pelvic: Cervical exam deferred         Extremities: Edema: Trace  Fetal Status:     Movement: Present    Results for orders placed or performed in visit on 07/28/18 (from the  past 24 hour(s))  POC Urinalysis Dipstick OB   Collection Time: 07/28/18  2:10 PM  Result Value Ref Range   Color, UA     Clarity, UA     Glucose, UA Negative Negative   Bilirubin, UA     Ketones, UA neg    Spec Grav, UA     Blood, UA neg    pH, UA     POC Protein UA Small (1+) (A) Negative, Trace   Urobilinogen, UA     Nitrite, UA pos    Leukocytes, UA Small (1+) (A) Negative   Appearance     Odor      Assessment & Plan:  1) Low-risk pregnancy G2P1000 at [redacted]w[redacted]d with an Estimated Date of Delivery: 11/02/18  2.  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  Labs/procedures today: Doppler  Follow-up: Return in about 2 weeks (around 08/11/2018) for PN-2 Labs.   Orders Placed This Encounter  Procedures  . Urine Culture  . POC Urinalysis Dipstick OB   By signing my name below, I, Pietro Cassis, attest that this documentation has been prepared under the direction and in the presence of Tilda Burrow, MD Electronically Signed: Pietro Cassis, Medical Scribe. 07/28/18. 2:32 PM.  I personally performed the services  described in this documentation, which was SCRIBED in my presence. The recorded information has been reviewed and considered accurate. It has been edited as necessary during review. Tilda Burrow, MD

## 2018-07-30 LAB — URINE CULTURE

## 2018-07-31 ENCOUNTER — Other Ambulatory Visit: Payer: Self-pay | Admitting: Obstetrics and Gynecology

## 2018-07-31 MED ORDER — NITROFURANTOIN MONOHYD MACRO 100 MG PO CAPS: 100.0000 mg | ORAL_CAPSULE | Freq: Two times a day (BID) | ORAL | 0 refills | Status: DC

## 2018-07-31 NOTE — Progress Notes (Signed)
K

## 2018-08-01 ENCOUNTER — Telehealth: Payer: Self-pay | Admitting: *Deleted

## 2018-08-01 NOTE — Telephone Encounter (Signed)
LMOVM antibiotic was sent to pharmacy for UTI

## 2018-08-06 ENCOUNTER — Encounter (HOSPITAL_COMMUNITY): Payer: Self-pay | Admitting: *Deleted

## 2018-08-06 ENCOUNTER — Inpatient Hospital Stay (HOSPITAL_COMMUNITY)
Admission: AD | Admit: 2018-08-06 | Discharge: 2018-08-07 | Disposition: A | Discharge status: Home / Self Care | Payer: Medicaid Other | Source: Ambulatory Visit | Attending: Obstetrics and Gynecology | Admitting: Obstetrics and Gynecology

## 2018-08-06 DIAGNOSIS — Z87891 Personal history of nicotine dependence: Secondary | ICD-10-CM | POA: Insufficient documentation

## 2018-08-06 DIAGNOSIS — Z3A27 27 weeks gestation of pregnancy: Secondary | ICD-10-CM

## 2018-08-06 DIAGNOSIS — R109 Unspecified abdominal pain: Secondary | ICD-10-CM | POA: Insufficient documentation

## 2018-08-06 DIAGNOSIS — O26892 Other specified pregnancy related conditions, second trimester: Secondary | ICD-10-CM | POA: Diagnosis not present

## 2018-08-06 DIAGNOSIS — F419 Anxiety disorder, unspecified: Secondary | ICD-10-CM | POA: Insufficient documentation

## 2018-08-06 DIAGNOSIS — F4310 Post-traumatic stress disorder, unspecified: Secondary | ICD-10-CM | POA: Insufficient documentation

## 2018-08-06 DIAGNOSIS — O99342 Other mental disorders complicating pregnancy, second trimester: Secondary | ICD-10-CM | POA: Insufficient documentation

## 2018-08-06 DIAGNOSIS — F913 Oppositional defiant disorder: Secondary | ICD-10-CM | POA: Diagnosis not present

## 2018-08-06 DIAGNOSIS — Z888 Allergy status to other drugs, medicaments and biological substances status: Secondary | ICD-10-CM | POA: Diagnosis not present

## 2018-08-06 DIAGNOSIS — Z79899 Other long term (current) drug therapy: Secondary | ICD-10-CM | POA: Insufficient documentation

## 2018-08-06 DIAGNOSIS — Z91018 Allergy to other foods: Secondary | ICD-10-CM | POA: Insufficient documentation

## 2018-08-06 DIAGNOSIS — Z833 Family history of diabetes mellitus: Secondary | ICD-10-CM | POA: Insufficient documentation

## 2018-08-06 DIAGNOSIS — Z880 Allergy status to penicillin: Secondary | ICD-10-CM | POA: Diagnosis not present

## 2018-08-06 DIAGNOSIS — F909 Attention-deficit hyperactivity disorder, unspecified type: Secondary | ICD-10-CM | POA: Diagnosis not present

## 2018-08-06 DIAGNOSIS — Z91040 Latex allergy status: Secondary | ICD-10-CM | POA: Diagnosis not present

## 2018-08-06 DIAGNOSIS — Z91013 Allergy to seafood: Secondary | ICD-10-CM | POA: Insufficient documentation

## 2018-08-06 DIAGNOSIS — O26899 Other specified pregnancy related conditions, unspecified trimester: Secondary | ICD-10-CM

## 2018-08-06 DIAGNOSIS — O2342 Unspecified infection of urinary tract in pregnancy, second trimester: Secondary | ICD-10-CM

## 2018-08-06 DIAGNOSIS — Z91030 Bee allergy status: Secondary | ICD-10-CM | POA: Insufficient documentation

## 2018-08-06 DIAGNOSIS — Z87442 Personal history of urinary calculi: Secondary | ICD-10-CM | POA: Diagnosis not present

## 2018-08-06 HISTORY — DX: Calculus of kidney: N20.0

## 2018-08-06 LAB — URINALYSIS, ROUTINE W REFLEX MICROSCOPIC
Bilirubin Urine: NEGATIVE
Glucose, UA: NEGATIVE mg/dL
Hgb urine dipstick: NEGATIVE
Ketones, ur: NEGATIVE mg/dL
Nitrite: POSITIVE — AB
Protein, ur: 30 mg/dL — AB
Specific Gravity, Urine: 1.019 (ref 1.005–1.030)
WBC, UA: >50 WBC/hpf — ABNORMAL HIGH (ref 0–5)
pH: 6 (ref 5.0–8.0)

## 2018-08-06 MED ORDER — CYCLOBENZAPRINE HCL 10 MG PO TABS
10.0000 mg | ORAL_TABLET | Freq: Once | ORAL | Status: AC
  Administered 2018-08-07: 10 mg via ORAL
  Filled 2018-08-06: qty 1

## 2018-08-06 MED ORDER — IBUPROFEN 600 MG PO TABS
600.0000 mg | ORAL_TABLET | Freq: Once | ORAL | Status: AC
  Administered 2018-08-07: 600 mg via ORAL
  Filled 2018-08-06: qty 1

## 2018-08-06 NOTE — MAU Provider Note (Signed)
History     CSN: [id number]  Arrival date and time: 08/06/18 2200   First Provider Initiated Contact with Patient 08/06/18 2334      Chief Complaint  Patient presents with  . Abdominal Pain   HPI   [name] is a 22 y.o. female 283P1010 @ 6719w3d here in MAU with contractions that started 1 week ago. Says over the last week the contractions have become stronger. Today she has felt the contractions every 15-20 minutes. She does not have a history of preterm labor. No bleeding. No recent sex.  Currently rates her pain 4/10.  Frequent UTI's this pregnancy. Just finished antibiotics.   OB History    Gravida  3   Para  1   Term  1   Preterm      AB  1   Living  0     SAB  1   TAB      Ectopic      Multiple      Live Births  1           Past Medical History:  Diagnosis Date  . ADHD (attention deficit hyperactivity disorder)   . Allergy   . Anxiety   . Asthma   . Complication of anesthesia   . Eating disorder   . Headache(784.0)   . Kidney stone   . Migraines   . ODD (oppositional defiant disorder)   . PTSD (post-traumatic stress disorder)   . Seizures (HCC)    diagnosed age 89    Past Surgical History:  Procedure Laterality Date  . right knee surgery    . TONSILLECTOMY    . TONSILLECTOMY AND ADENOIDECTOMY      Family History  Problem Relation Age of Onset  . Diabetes Mother   . Diabetes Sister   . Breast cancer Maternal Grandmother   . Diabetes Maternal Grandmother   . Glaucoma Maternal Grandmother     Social History   Tobacco Use  . Smoking status: Former Smoker    Types: Cigarettes  . Smokeless tobacco: Never Used  Substance Use Topics  . Alcohol use: Not Currently    Comment: 3-4 glassed vodka/beer(not while pregnant)  . 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 Bee Venom Anaphylaxis  . Cashew Nut Oil  Anaphylaxis  . Penicillins Rash    Has patient had a PCN reaction causing immediate rash, facial/tongue/throat swelling, SOB or lightheadedness with hypotension: Unknown Has patient had a PCN reaction causing severe rash involving mucus membranes or skin necrosis: Unknown Has patient had a PCN reaction that required hospitalization: Unknown Has patient had a PCN reaction occurring within the last 10 years: Unknown If all of the above answers are "NO", then may proceed with Cephalosporin use.   . Prednisone Other (See Comments)    Bleeding nose bleeding and internal bleeding  . Cocoa Butter Rash  . Latex Rash  . Shellfish Allergy Hives and Rash    Medications Prior to Admission  Medication Sig Dispense Refill Last Dose  . acetaminophen (TYLENOL) 325 MG tablet Take 200 mg by mouth every 6 (six) hours as needed.     Marland Kitchen. albuterol (PROVENTIL HFA;VENTOLIN HFA) 108 (90 Base) MCG/ACT inhaler Inhale 2 puffs into the lungs every 6 (six) hours as needed for wheezing or shortness of breath. 1 Inhaler 2 Past Month at  Unknown time  . levETIRAcetam (KEPPRA) 500 MG tablet Take 2 tablets (1,000 mg total) by mouth 2 (two) times daily. 120 tablet 2 08/06/2018 at Unknown time  . Prenatal Vit-Fe Fumarate-FA (PRENATAL MULTIVITAMIN) TABS tablet Take 1 tablet by mouth daily at 12 noon. 30 tablet 11 08/06/2018 at Unknown time  . EPINEPHrine 0.3 mg/0.3 mL IJ SOAJ injection Inject 0.3 mLs (0.3 mg total) into the muscle as needed. 1 Device 0 More than a month at Unknown time  . nitrofurantoin, macrocrystal-monohydrate, (MACROBID) 100 MG capsule Take 1 capsule (100 mg total) by mouth 2 (two) times daily. For uti 14 capsule 0 More than a month at Unknown time  . ranitidine (ZANTAC) 150 MG tablet Take 1 tablet (150 mg total) by mouth 2 (two) times daily. (Patient not taking: Reported on 05/24/2018) 60 tablet 0 Not Taking   Results for orders placed or performed during the hospital encounter of 08/06/18 (from the past 48  hour(s))  Urinalysis, Routine w reflex microscopic     Status: Abnormal   Collection Time: 08/06/18 10:43 PM  Result Value Ref Range   Color, Urine AMBER (A) YELLOW    Comment: BIOCHEMICALS MAY BE AFFECTED BY COLOR   APPearance CLOUDY (A) CLEAR   Specific Gravity, Urine 1.019 1.005 - 1.030   pH 6.0 5.0 - 8.0   Glucose, UA NEGATIVE NEGATIVE mg/dL   Hgb urine dipstick NEGATIVE NEGATIVE   Bilirubin Urine NEGATIVE NEGATIVE   Ketones, ur NEGATIVE NEGATIVE mg/dL   Protein, ur 30 (A) NEGATIVE mg/dL   Nitrite POSITIVE (A) NEGATIVE   Leukocytes, UA LARGE (A) NEGATIVE   RBC / HPF 0-5 0 - 5 RBC/hpf   WBC, UA >50 (H) 0 - 5 WBC/hpf   Bacteria, UA MANY (A) NONE SEEN   Squamous Epithelial / LPF 0-5 0 - 5   Mucus PRESENT     Comment: Performed at [facility], [address] [place], [place] [address]   Review of Systems  Constitutional: Negative for fever.  Gastrointestinal: Positive for abdominal pain.  Genitourinary: Positive for urgency. Negative for dysuria and flank pain.  Musculoskeletal: Positive for back pain.   Physical Exam   Blood pressure (!) 111/55, pulse 90, temperature 97.9 F (36.6 C), resp. rate 18, height 5\' 7"  (1.702 m), weight 80.3 kg, last menstrual period 01/26/2018, unknown if currently breastfeeding.  Physical Exam  Constitutional: She is oriented to person, place, and time. She appears well-developed and well-nourished. No distress.  GI: Soft. Normal appearance. There is no CVA tenderness.  Genitourinary:  Genitourinary Comments: Dilation: Closed Exam by:: Venia Carbon NP   Musculoskeletal: Normal range of motion.  Neurological: She is alert and oriented to person, place, and time.  Skin: Skin is warm. She is not diaphoretic.  Psychiatric: Her behavior is normal.    Fetal Tracing: Baseline: 140 bpm  Variability: Moderate  Accelerations: 10x10 Decelerations: None Toco: None  MAU Course  Procedures  None  MDM  Ibuprofen given 600 mg  PO Flexeril 10 mg PO Bactrim given in MAU Discussed antibiotic use with Dr. Emelda Fear. Will start Bactrim DS X 7 days then start Macrobid for suppression daily.  Urine culture sent   Assessment and Plan   A:  1. Abdominal pain during pregnancy, antepartum   2. [redacted] weeks gestation of pregnancy   3. UTI in pregnancy, antepartum, second trimester     P:  Discharge home in stable condition Rx: Bactrim, Macrobid for suppression F/U with OB next week Return to MAU  if symptoms worsen  Rasch, Harolyn Rutherford, NP 08/07/2018 2:04 AM

## 2018-08-06 NOTE — MAU Note (Addendum)
For last wk have had ctxs. Coming more frequent and intense. Now coming more frequent. Denies LOF or bleeding. Baby feels lower than normal

## 2018-08-07 MED ORDER — NITROFURANTOIN MONOHYD MACRO 100 MG PO CAPS: 100.0000 mg | ORAL_CAPSULE | Freq: Every day | ORAL | 2 refills | Status: DC

## 2018-08-07 MED ORDER — SULFAMETHOXAZOLE-TRIMETHOPRIM 800-160 MG PO TABS: 1.0000 | ORAL_TABLET | Freq: Two times a day (BID) | ORAL | Status: DC

## 2018-08-07 MED ORDER — SULFAMETHOXAZOLE-TRIMETHOPRIM 800-160 MG PO TABS
1.0000 | ORAL_TABLET | Freq: Once | ORAL | Status: AC
  Administered 2018-08-07: 1 via ORAL
  Filled 2018-08-07: qty 1

## 2018-08-07 MED ORDER — SULFAMETHOXAZOLE-TRIMETHOPRIM 800-160 MG PO TABS: 1.0000 | ORAL_TABLET | Freq: Two times a day (BID) | ORAL | 0 refills | Status: AC

## 2018-08-09 LAB — CULTURE, OB URINE
Culture: >=100000 — AB
Special Requests: NORMAL

## 2018-08-15 ENCOUNTER — Encounter: Payer: Self-pay | Admitting: Women's Health

## 2018-08-15 ENCOUNTER — Other Ambulatory Visit: Payer: Medicaid Other

## 2018-08-15 ENCOUNTER — Ambulatory Visit (INDEPENDENT_AMBULATORY_CARE_PROVIDER_SITE_OTHER): Payer: Medicaid Other | Admitting: Women's Health

## 2018-08-15 VITALS — BP 117/75 | HR 72 | Wt 178.5 lb

## 2018-08-15 DIAGNOSIS — O2343 Unspecified infection of urinary tract in pregnancy, third trimester: Secondary | ICD-10-CM

## 2018-08-15 DIAGNOSIS — Z634 Disappearance and death of family member: Secondary | ICD-10-CM

## 2018-08-15 DIAGNOSIS — F1290 Cannabis use, unspecified, uncomplicated: Secondary | ICD-10-CM

## 2018-08-15 DIAGNOSIS — O26843 Uterine size-date discrepancy, third trimester: Secondary | ICD-10-CM

## 2018-08-15 DIAGNOSIS — Z3A28 28 weeks gestation of pregnancy: Secondary | ICD-10-CM

## 2018-08-15 DIAGNOSIS — Z23 Encounter for immunization: Secondary | ICD-10-CM | POA: Diagnosis not present

## 2018-08-15 DIAGNOSIS — Z131 Encounter for screening for diabetes mellitus: Secondary | ICD-10-CM

## 2018-08-15 DIAGNOSIS — Z331 Pregnant state, incidental: Secondary | ICD-10-CM

## 2018-08-15 DIAGNOSIS — Z8279 Family history of other congenital malformations, deformations and chromosomal abnormalities: Secondary | ICD-10-CM

## 2018-08-15 DIAGNOSIS — Z3483 Encounter for supervision of other normal pregnancy, third trimester: Secondary | ICD-10-CM

## 2018-08-15 DIAGNOSIS — Z1389 Encounter for screening for other disorder: Secondary | ICD-10-CM

## 2018-08-15 LAB — POCT URINALYSIS DIPSTICK OB
Glucose, UA: NEGATIVE
Ketones, UA: NEGATIVE
Nitrite, UA: NEGATIVE
POC,PROTEIN,UA: NEGATIVE

## 2018-08-15 NOTE — Patient Instructions (Addendum)
[facility], I greatly value your feedback.  If you receive a survey following your visit with [place] today, we appreciate you taking the time to fill it out.  Thanks, Knute Neu, CNM, WHNP-BC   Call the office [phone]) or go to [facility] if:  You begin to have strong, frequent contractions  Your water breaks.  Sometimes it is a big gush of fluid, sometimes it is just a trickle that keeps getting your panties wet or running down your legs  You have vaginal bleeding.  It is normal to have a small amount of spotting if your cervix was checked.   You don't feel your baby moving like normal.  If you don't, get you something to eat and drink and lay down and focus on feeling your baby move.  You should feel at least 10 movements in 2 hours.  If you don't, you should call the office or go to [facility].    Tdap Vaccine  It is recommended that you get the Tdap vaccine during the third trimester of EACH pregnancy to help protect your baby from getting pertussis (whooping cough)  27-36 weeks is the BEST time to do this so that you can pass the protection on to your baby. During pregnancy is better than after pregnancy, but if you are unable to get it during pregnancy it will be offered at the hospital.   You can get this vaccine with [place], at the health department, your family doctor, or some local pharmacies  Everyone who will be around your baby should also be up-to-date on their vaccines before the baby comes. Adults (who are not pregnant) only need 1 dose of Tdap during adulthood.   Third Trimester of Pregnancy The third trimester is from week 29 through week 42, months 7 through 9. The third trimester is a time when the fetus is growing rapidly. At the end of the ninth month, the fetus is about 20 inches in length and weighs 6-10 pounds.  BODY CHANGES Your body goes through many changes during pregnancy. The changes vary from woman to woman.   Your weight will continue to  increase. You can expect to gain 25-35 pounds (11-16 kg) by the end of the pregnancy.  You may begin to get stretch marks on your hips, abdomen, and breasts.  You may urinate more often because the fetus is moving lower into your pelvis and pressing on your bladder.  You may develop or continue to have heartburn as a result of your pregnancy.  You may develop constipation because certain hormones are causing the muscles that push waste through your intestines to slow down.  You may develop hemorrhoids or swollen, bulging veins (varicose veins).  You may have pelvic pain because of the weight gain and pregnancy hormones relaxing your joints between the bones in your pelvis. Backaches may result from overexertion of the muscles supporting your posture.  You may have changes in your hair. These can include thickening of your hair, rapid growth, and changes in texture. Some women also have hair loss during or after pregnancy, or hair that feels dry or thin. Your hair will most likely return to normal after your baby is born.  Your breasts will continue to grow and be tender. A yellow discharge may leak from your breasts called colostrum.  Your belly button may stick out.  You may feel short of breath because of your expanding uterus.  You may notice the fetus "dropping," or moving lower in  your abdomen.  You may have a bloody mucus discharge. This usually occurs a few days to a week before labor begins.  Your cervix becomes thin and soft (effaced) near your due date. WHAT TO EXPECT AT YOUR PRENATAL EXAMS  You will have prenatal exams every 2 weeks until week 36. Then, you will have weekly prenatal exams. During a routine prenatal visit:  You will be weighed to make sure you and the fetus are growing normally.  Your blood pressure is taken.  Your abdomen will be measured to track your baby's growth.  The fetal heartbeat will be listened to.  Any test results from the previous visit  will be discussed.  You may have a cervical check near your due date to see if you have effaced. At around 36 weeks, your caregiver will check your cervix. At the same time, your caregiver will also perform a test on the secretions of the vaginal tissue. This test is to determine if a type of bacteria, Group B streptococcus, is present. Your caregiver will explain this further. Your caregiver may ask you:  What your birth plan is.  How you are feeling.  If you are feeling the baby move.  If you have had any abnormal symptoms, such as leaking fluid, bleeding, severe headaches, or abdominal cramping.  If you have any questions. Other tests or screenings that may be performed during your third trimester include:  Blood tests that check for low iron levels (anemia).  Fetal testing to check the health, activity level, and growth of the fetus. Testing is done if you have certain medical conditions or if there are problems during the pregnancy. FALSE LABOR You may feel small, irregular contractions that eventually go away. These are called Braxton Hicks contractions, or false labor. Contractions may last for hours, days, or even weeks before true labor sets in. If contractions come at regular intervals, intensify, or become painful, it is best to be seen by your caregiver.  SIGNS OF LABOR   Menstrual-like cramps.  Contractions that are 5 minutes apart or less.  Contractions that start on the top of the uterus and spread down to the lower abdomen and back.  A sense of increased pelvic pressure or back pain.  A watery or bloody mucus discharge that comes from the vagina. If you have any of these signs before the 37th week of pregnancy, call your caregiver right away. You need to go to the hospital to get checked immediately. HOME CARE INSTRUCTIONS   Avoid all smoking, herbs, alcohol, and unprescribed drugs. These chemicals affect the formation and growth of the baby.  Follow your  caregiver's instructions regarding medicine use. There are medicines that are either safe or unsafe to take during pregnancy.  Exercise only as directed by your caregiver. Experiencing uterine cramps is a good sign to stop exercising.  Continue to eat regular, healthy meals.  Wear a good support bra for breast tenderness.  Do not use hot tubs, steam rooms, or saunas.  Wear your seat belt at all times when driving.  Avoid raw meat, uncooked cheese, cat litter boxes, and soil used by cats. These carry germs that can cause birth defects in the baby.  Take your prenatal vitamins.  Try taking a stool softener (if your caregiver approves) if you develop constipation. Eat more high-fiber foods, such as fresh vegetables or fruit and whole grains. Drink plenty of fluids to keep your urine clear or pale yellow.  Take warm sitz baths to  soothe any pain or discomfort caused by hemorrhoids. Use hemorrhoid cream if your caregiver approves.  If you develop varicose veins, wear support hose. Elevate your feet for 15 minutes, 3-4 times a day. Limit salt in your diet.  Avoid heavy lifting, wear low heal shoes, and practice good posture.  Rest a lot with your legs elevated if you have leg cramps or low back pain.  Visit your dentist if you have not gone during your pregnancy. Use a soft toothbrush to brush your teeth and be gentle when you floss.  A sexual relationship may be continued unless your caregiver directs you otherwise.  Do not travel far distances unless it is absolutely necessary and only with the approval of your caregiver.  Take prenatal classes to understand, practice, and ask questions about the labor and delivery.  Make a trial run to the hospital.  Pack your hospital bag.  Prepare the baby's nursery.  Continue to go to all your prenatal visits as directed by your caregiver. SEEK MEDICAL CARE IF:  You are unsure if you are in labor or if your water has broken.  You have  dizziness.  You have mild pelvic cramps, pelvic pressure, or nagging pain in your abdominal area.  You have persistent nausea, vomiting, or diarrhea.  You have a bad smelling vaginal discharge.  You have pain with urination. SEEK IMMEDIATE MEDICAL CARE IF:   You have a fever.  You are leaking fluid from your vagina.  You have spotting or bleeding from your vagina.  You have severe abdominal cramping or pain.  You have rapid weight loss or gain.  You have shortness of breath with chest pain.  You notice sudden or extreme swelling of your face, hands, ankles, feet, or legs.  You have not felt your baby move in over an hour.  You have severe headaches that do not go away with medicine.  You have vision changes. Document Released: 11/03/2001 Document Revised: 11/14/2013 Document Reviewed: 01/10/2013 [facility] Patient Information 2015 [place], [place]. This information is not intended to replace advice given to you by your health care provider. Make sure you discuss any questions you have with your health care provider.   Thinking About Doren Custard???  Why consider waterbirth? . Gentle birth for babies . Less pain medicine used in labor . May allow for passive descent/less pushing . May reduce perineal tears  . More mobility and instinctive maternal position changes . Increased maternal relaxation . Reduced blood pressure in labor  Is waterbirth safe? What are the risks of infection, drowning or other complications? . Infection o Very low risk (3.7 % for tub vs 4.8% for bed) o 7 in 8000 waterbirths with documented infection o Poorly cleaned equipment most common cause o Slightly lower group B strep transmission rate  . Drowning o Maternal:  - Very low risk   - Related to seizures or fainting o Newborn:  - Very low risk. No evidence of increased risk of respiratory problems in multiple large studies - Physiological protection from breathing under water - Avoid  underwater birth if there are any fetal complications - Once baby's head is out of the water, keep it out.  . Birth complication o Some reports of cord trauma, but risk decreased by bringing baby to surface gradually o No evidence of increased risk of shoulder dystocia. Mothers can usually change positions faster in water than in a bed, possibly aiding the maneuvers to free the shoulder.  You must attend a Waterbirth class  at [facility]  3rd Wednesday of every month from 7-9pm  Free  Register by calling [phone] or online at [email or web address]  Bring [place] the certificate from the class  Waterbirth supplies needed for [facility] patients:  Our practice has a [employer] manager in a Box tub (Regular size) at the hospital that you can borrow  You will need to purchase an accessory kit that has all needed supplies through [employer] [phone] for kit, $65 for liner=$179+tax) or online through [employer]is  Or you can purchase the supplies separately: o Single-use disposable tub liner for [employer] in a Box (REGULAR size) o New garden hose labeled "lead-free", "suitable for drinking water", "non-toxic" OR "water potable" o Garden hose to remove the dirty water o Electric drain pump to remove water (We recommend 792 gallon per hour or greater pump.)  o Fish net o Bathing suit top (optional) o Long-handled mirror (optional)  [employer]- sells EVERYTHING waterbirth related, accessory kits, tubs, etc  The [employer] (www.thelaborladies.com) this is a great service if you don't want to be responsible for the set up/take down of tub! Just call the Labor Ladies and they will come do it for you for $200! This includes the rental fee for their tub, the accessory kit, set-up and take down   Things that would prevent you from having a waterbirth:  Premature, <37wks  Previous cesarean birth  Presence of thick meconium-stained fluid  Multiple gestation  (Twins, triplets, etc.)  Uncontrolled diabetes  Hypertension  Heavy vaginal bleeding  Non-reassuring fetal heart rate  Active infection (MRSA, etc.)  If your labor has to be induced  Other risk issues identified by your obstetrical provider

## 2018-08-15 NOTE — Progress Notes (Signed)
LOW-RISK PREGNANCY VISIT Patient name: Erin Krueger MRN [id number]  Date of birth: 12/26/1995 Chief Complaint:   Routine Prenatal Visit (PN2 today)  History of Present Illness:   Erin Krueger is a 22 y.o. G69P1010 female at [redacted]w[redacted]d with an Estimated Date of Delivery: 11/02/18 being seen today for ongoing management of a low-risk pregnancy.  Today she reports went to [facility] 9/[facility], rx'd bactrim x7d then macrobid qhs for UTI, has not picked up meds yet d/t transportation issues, just got car back yesterday. Denies fever/chills, flank pain. Has had persitent ecoli/ESBL urine cx entire pregnancy despite tx. 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 On keppra for seizures. May be interested in waterbirth.  Contractions: Not present. Vag. Bleeding: None.  Movement: Present. denies leaking of fluid. Review of Systems:   Pertinent items are noted in HPI Denies abnormal vaginal discharge w/ itching/odor/irritation, headaches, visual changes, shortness of breath, chest pain, abdominal pain, severe nausea/vomiting, or problems with urination or bowel movements unless otherwise stated above. Pertinent History Reviewed:  Reviewed past medical,surgical, social, obstetrical and family history.  Reviewed problem list, medications and allergies. Physical Assessment:   Vitals:   08/15/18 0940  BP: 117/75  Pulse: 72  Weight: 178 lb 8 oz (81 kg)  Body mass index is 27.96 kg/m.        Physical Examination:   General appearance: Well appearing, and in no distress  Mental status: Alert, oriented to person, place, and time  Skin: Warm & dry  Cardiovascular: Normal heart rate noted  Respiratory: Normal respiratory effort, no distress  Abdomen: Soft, gravid, nontender  Pelvic: Cervical exam deferred         Extremities: Edema: Trace  Fetal Status: Fetal Heart Rate (bpm): 140 Fundal Height: 25 cm Movement: Present    Results for orders placed or performed in visit on  08/15/18 (from the past 24 hour(s))  POC Urinalysis Dipstick OB   Collection Time: 08/15/18  9:41 AM  Result Value Ref Range   Color, UA     Clarity, UA     Glucose, UA Negative Negative   Bilirubin, UA     Ketones, UA neg    Spec Grav, UA     Blood, UA trace    pH, UA     POC Protein UA Negative Negative, Trace   Urobilinogen, UA     Nitrite, UA neg    Leukocytes, UA Moderate (2+) (A) Negative   Appearance     Odor      Assessment & Plan:  1) Low-risk pregnancy G3P1010 at [redacted]w[redacted]d with an Estimated Date of Delivery: 11/02/18   2) Persistent ecoli/ESBL uti, hasn't picked up most recent meds yet. To pick up today, take bactrim bid x7d as directed, then macrobid qhs for suppression thereafter. Reviewed s/s pyelo, reasons to seek care  3) H/O old passing d/t cardiac defects> referral for fetal echo sen today, pt to let us know if she doesn't hear from them w/in 1wk  4) Interested in waterbirth> gave printed info, to take class  5) Uterine size <dates- will get efw/afi u/s   Meds: No orders of the defined types were placed in this encounter.  Labs/procedures today: pn2, flu & tdap shots  Plan:  Continue routine obstetrical care   Reviewed: Preterm labor symptoms and general obstetric precautions including but not limited to vaginal bleeding, contractions, leaking of fluid and fetal movement were reviewed in detail with the patient.  All questions were answered  Follow-up: Return for asap efw/afi u/s (no visit), then 4wks for LROB and efw/afi u/s.  Orders Placed This Encounter  Procedures  . US OB Follow Up  . Flu Vaccine QUAD 36+ mos IM (Fluarix, Quad PF)  . Tdap vaccine greater than or equal to 21yo IM  . POC Urinalysis Dipstick OB   Cheral MarkerKimberly R Booker CNM, [facility] MayWHNP-BC 08/15/2018 12:28 PM

## 2018-08-16 LAB — CBC
Hematocrit: 36 % (ref 34.0–46.6)
Hemoglobin: 12 g/dL (ref 11.1–15.9)
MCH: 29.4 pg (ref 26.6–33.0)
MCHC: 33.3 g/dL (ref 31.5–35.7)
MCV: 88 fL (ref 79–97)
Platelets: 227 10*3/uL (ref 150–450)
RBC: 4.08 x10E6/uL (ref 3.77–5.28)
RDW: 12.1 % — ABNORMAL LOW (ref 12.3–15.4)
WBC: 11.6 10*3/uL — ABNORMAL HIGH (ref 3.4–10.8)

## 2018-08-16 LAB — ANTIBODY SCREEN: Antibody Screen: NEGATIVE

## 2018-08-16 LAB — GLUCOSE TOLERANCE, 2 HOURS W/ 1HR
Glucose, 1 hour: 119 mg/dL (ref 65–179)
Glucose, 2 hour: 74 mg/dL (ref 65–152)
Glucose, Fasting: 73 mg/dL (ref 65–91)

## 2018-08-16 LAB — RPR: RPR Ser Ql: NONREACTIVE

## 2018-08-16 LAB — HIV ANTIBODY (ROUTINE TESTING W REFLEX): HIV Screen 4th Generation wRfx: NONREACTIVE

## 2018-08-23 ENCOUNTER — Encounter (HOSPITAL_COMMUNITY): Payer: Self-pay

## 2018-08-23 ENCOUNTER — Other Ambulatory Visit: Payer: Self-pay

## 2018-08-23 ENCOUNTER — Inpatient Hospital Stay (HOSPITAL_COMMUNITY)
Admission: EM | Admit: 2018-08-23 | Discharge: 2018-08-24 | Disposition: A | Discharge status: Home / Self Care | Attending: Family Medicine | Admitting: Family Medicine

## 2018-08-23 DIAGNOSIS — O98313 Other infections with a predominantly sexual mode of transmission complicating pregnancy, third trimester: Secondary | ICD-10-CM | POA: Insufficient documentation

## 2018-08-23 DIAGNOSIS — Z880 Allergy status to penicillin: Secondary | ICD-10-CM | POA: Diagnosis not present

## 2018-08-23 DIAGNOSIS — A5901 Trichomonal vulvovaginitis: Secondary | ICD-10-CM | POA: Diagnosis not present

## 2018-08-23 DIAGNOSIS — O4693 Antepartum hemorrhage, unspecified, third trimester: Secondary | ICD-10-CM | POA: Insufficient documentation

## 2018-08-23 DIAGNOSIS — O23593 Infection of other part of genital tract in pregnancy, third trimester: Secondary | ICD-10-CM | POA: Diagnosis not present

## 2018-08-23 DIAGNOSIS — O4690 Antepartum hemorrhage, unspecified, unspecified trimester: Secondary | ICD-10-CM

## 2018-08-23 DIAGNOSIS — Z3A30 30 weeks gestation of pregnancy: Secondary | ICD-10-CM | POA: Diagnosis not present

## 2018-08-23 LAB — CBC WITH DIFFERENTIAL/PLATELET
Basophils Absolute: 0 10*3/uL (ref 0.0–0.1)
Basophils Relative: 0 %
Eosinophils Absolute: 0.1 10*3/uL (ref 0.0–0.7)
Eosinophils Relative: 1 %
HCT: 34 % — ABNORMAL LOW (ref 36.0–46.0)
Hemoglobin: 11.4 g/dL — ABNORMAL LOW (ref 12.0–15.0)
Lymphocytes Relative: 26 %
Lymphs Abs: 3.4 10*3/uL (ref 0.7–4.0)
MCH: 31 pg (ref 26.0–34.0)
MCHC: 33.5 g/dL (ref 30.0–36.0)
MCV: 92.4 fL (ref 78.0–100.0)
Monocytes Absolute: 1.1 10*3/uL — ABNORMAL HIGH (ref 0.1–1.0)
Monocytes Relative: 8 %
Neutro Abs: 8.6 10*3/uL — ABNORMAL HIGH (ref 1.7–7.7)
Neutrophils Relative %: 65 %
Platelets: 188 10*3/uL (ref 150–400)
RBC: 3.68 MIL/uL — ABNORMAL LOW (ref 3.87–5.11)
RDW: 12.2 % (ref 11.5–15.5)
WBC: 13.1 10*3/uL — ABNORMAL HIGH (ref 4.0–10.5)

## 2018-08-23 LAB — BASIC METABOLIC PANEL
Anion gap: 8 (ref 5–15)
BUN: 9 mg/dL (ref 6–20)
CO2: 22 mmol/L (ref 22–32)
Calcium: 9 mg/dL (ref 8.9–10.3)
Chloride: 107 mmol/L (ref 98–111)
Creatinine, Ser: 0.44 mg/dL (ref 0.44–1.00)
GFR calc Af Amer: >60 mL/min (ref >60)
GFR calc non Af Amer: >60 mL/min (ref >60)
Glucose, Bld: 90 mg/dL (ref 70–99)
Potassium: 3.7 mmol/L (ref 3.5–5.1)
Sodium: 137 mmol/L (ref 135–145)

## 2018-08-23 LAB — ABO/RH: ABO/RH(D): O POS

## 2018-08-23 MED ORDER — SODIUM CHLORIDE 0.9 % IV BOLUS
1000.0000 mL | Freq: Once | INTRAVENOUS | Status: AC
  Administered 2018-08-24: 1000 mL via INTRAVENOUS

## 2018-08-23 NOTE — ED Triage Notes (Signed)
Pt brought to ED via [facility] for bright red vaginal bleeding. Pt is currently [redacted] weeks pregnant. Pt states she has soaked one pad in the last hour. Pt denies any problems with this pregnancy. Pt denies leaking fluid, or contractions. Pt states decreased in fetal movement and the last time she felt the baby move was a couple hours ago.

## 2018-08-23 NOTE — ED Provider Notes (Signed)
[facility] EMERGENCY DEPARTMENT Provider Note   CSN: [id number] Arrival date & time: 08/23/18  2154     History   Chief Complaint Chief Complaint  Patient presents with  . Vaginal Bleeding    HPI Erin Krueger is a 22 y.o. female.  Patient brought in by [facility] department.  She is currently incarcerated.  She is [redacted] weeks pregnant G3, P1 at [redacted] weeks gestation.  States she noticed vaginal bleeding onset around 11:30 AM today.  She is had about 3-4 pads worth of bright red bleeding since then.  She is had crampy abdominal pain for the past 3 days that feels like "severe menstrual cramps" but she denies any contractions or leaking of fluid.  She is had decreased fetal movement for the past several hours.  States she is had nausea and vomiting but this is been constant throughout this pregnancy.  Denies any diarrhea.  Denies any pain with urination.  She is supposed to be on antibiotics for UTI was not had her medications since she has been incarcerated for the past 6 days. Denies any trauma or recent sexual activity.  The history is provided by the patient and the EMS personnel.  Vaginal Bleeding  Primary symptoms include vaginal bleeding.  Primary symptoms include no dysuria. Associated symptoms include nausea and vomiting. Pertinent negatives include no abdominal pain.    Past Medical History:  Diagnosis Date  . ADHD (attention deficit hyperactivity disorder)   . Allergy   . Anxiety   . Asthma   . Complication of anesthesia   . Eating disorder   . Headache(784.0)   . Kidney stone   . Migraines   . ODD (oppositional defiant disorder)   . PTSD (post-traumatic stress disorder)   . Seizures [facility])    diagnosed age 61    Patient Active Problem List   Diagnosis Date Noted  . Persistent UTI (urinary tract infection) during pregnancy 04/08/2018  . Marijuana use 04/07/2018  . Supervision of normal pregnancy May 02, 2018  . Smoker 02-May-2018  . Death of child 2018-05-02  .  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 PID (acute pelvic inflammatory disease) 01/29/2016  . Seizure disorder (HCC) 01/07/2016  . Seizure (HCC) 01/07/2016  . 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 PTSD (post-traumatic stress disorder) 05/12/2013  . 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 Polysubstance abuse (HCC) 05/12/2013    Past Surgical History:  Procedure Laterality Date  . right knee surgery    . TONSILLECTOMY    . TONSILLECTOMY AND ADENOIDECTOMY       OB History    Gravida  3   Para  1   Term  1   Preterm      AB  1   Living  0     SAB  1   TAB      Ectopic      Multiple      Live Births  1            Home Medications    Prior to Admission medications   Medication Sig Start Date End Date Taking? Authorizing Provider  acetaminophen (TYLENOL) 500 MG tablet Take 500 mg by mouth as needed.    [provider]  albuterol (PROVENTIL HFA;VENTOLIN HFA) 108 (90 Base) MCG/ACT inhaler Inhale 2 puffs into the lungs every 6 (six) hours as needed for wheezing or shortness of breath. 2018-05-02   Cheral Marker, CNM  EPINEPHrine 0.3 mg/0.3 mL IJ SOAJ injection Inject 0.3  mLs (0.3 mg total) into the muscle as needed. 06/27/18   Cheral Marker, CNM  Fructose-Dextrose-Phosphor Acd (EMETROL PO) as needed.     [provider]  levETIRAcetam (KEPPRA) 500 MG tablet Take 2 tablets (1,000 mg total) by mouth 2 (two) times daily. 05/31/18   Cheral Marker, CNM  Prenatal Vit-Fe Fumarate-FA (PRENATAL MULTIVITAMIN) TABS tablet Take 1 tablet by mouth daily at 12 noon. 04/26/18   Cresenzo-Dishmon, Scarlette Calico, CNM  ranitidine (ZANTAC) 150 MG tablet Take 1 tablet (150 mg total) by mouth 2 (two) times daily. Patient taking differently: Take 150 mg by mouth as needed.  04/13/18   Eber Hong, MD    Family History Family History  Problem Relation Age of Onset  . Diabetes Mother   . Diabetes Sister   . Breast cancer  Maternal Grandmother   . Diabetes Maternal Grandmother   . Glaucoma Maternal Grandmother     Social History Social History   Tobacco Use  . Smoking status: Former Smoker    Types: Cigarettes  . Smokeless tobacco: Never Used  Substance Use Topics  . Alcohol use: Not Currently    Comment: 3-4 glassed vodka/beer(not while pregnant)  . Drug use: Not Currently    Types: Marijuana    Comment: no drug use since pregnant- cocaine last use 2018     Allergies   Bee venom; Cashew nut oil; Penicillins; Prednisone; Cocoa butter; Latex; and Shellfish allergy   Review of Systems Review of Systems  Constitutional: Negative for activity change, appetite change and fever.  Respiratory: Negative for cough, chest tightness and shortness of breath.   Cardiovascular: Negative for chest pain.  Gastrointestinal: Positive for nausea and vomiting. Negative for abdominal pain.  Genitourinary: Positive for vaginal bleeding. Negative for dysuria, hematuria and vaginal discharge.  Musculoskeletal: Negative for arthralgias and myalgias.   all other systems are negative except as noted in the HPI and PMH.     Physical Exam Updated Vital Signs BP 119/84 (BP Location: Right Arm)   Pulse 93   Temp 97.9 F (36.6 C) (Oral)   Resp 16   Ht 5\' 7"  (1.702 m)   Wt 80.7 kg   LMP 01/26/2018   SpO2 94%   BMI 27.88 kg/m   Physical Exam  Constitutional: She is oriented to person, place, and time. She appears well-developed and well-nourished. No distress.  HENT:  Head: Normocephalic and atraumatic.  Mouth/Throat: Oropharynx is clear and moist. No oropharyngeal exudate.  Eyes: Pupils are equal, round, and reactive to light. Conjunctivae and EOM are normal.  Neck: Normal range of motion. Neck supple.  No meningismus.  Cardiovascular: Normal rate, regular rhythm, normal heart sounds and intact distal pulses.  No murmur heard. Pulmonary/Chest: Effort normal and breath sounds normal. No respiratory distress.   Abdominal: Soft. There is tenderness. There is no rebound and no guarding.  Gravid. Suprapubic tenderness. No guarding or rebound.  Musculoskeletal: Normal range of motion. She exhibits no edema or tenderness.  No CVAT  Neurological: She is alert and oriented to person, place, and time. No cranial nerve deficit. She exhibits normal muscle tone. Coordination normal.  No ataxia on finger to nose bilaterally. No pronator drift. 5/5 strength throughout. CN 2-12 intact.Equal grip strength. Sensation intact.   Skin: Skin is warm. Capillary refill takes less than 2 seconds. No rash noted.  Psychiatric: She has a normal mood and affect. Her behavior is normal.  Nursing note and vitals reviewed.    ED Treatments / Results  Labs (all labs ordered are listed, but only abnormal results are displayed) Labs Reviewed  CBC WITH DIFFERENTIAL/PLATELET - Abnormal; Notable for the following components:      Result Value   WBC 13.1 (*)    RBC 3.68 (*)    Hemoglobin 11.4 (*)    HCT 34.0 (*)    Neutro Abs 8.6 (*)    Monocytes Absolute 1.1 (*)    All other components within normal limits  URINALYSIS, ROUTINE W REFLEX MICROSCOPIC - Abnormal; Notable for the following components:   APPearance HAZY (*)    Hgb urine dipstick LARGE (*)    All other components within normal limits  WET PREP, GENITAL  URINE CULTURE  BASIC METABOLIC PANEL  ABO/RH    EKG None  Radiology No results found.  Procedures Procedures (including critical care time)  Medications Ordered in ED Medications - No data to display   Initial Impression / Assessment and Plan / ED Course  I have reviewed the triage vital signs and the nursing notes.  Pertinent labs & imaging results that were available during my care of the patient were reviewed by me and considered in my medical decision making (see chart for details).    Patient [redacted] weeks pregnant with vaginal bleeding.  Decreased fetal movements.   Ultrasound shows fetal  movement present with heart rate 158.  Vitals remain stable. Hemoglobin stable at 11.4 Rh positive. Not rhogam candidate.  Urinalysis shows both red and white cells.  She denies UTI symptoms.  We will send culture.  Previous culture grew ESBL E coli and patient has been noncompliant with antibiotics. Will restart macrobid.  Transfer to [facility] discussed with Dr. Adrian Blackwater who accepts to the MAU.  EMERGENCY DEPARTMENT [place] PREGNANCY "Study: Limited Ultrasound of the Pelvis for Pregnancy"  INDICATIONS:Pregnancy(required), Vaginal bleeding and Abdominal or pelvic pain Multiple views of the uterus and pelvic cavity were obtained in real-time with a multi-frequency probe.  APPROACH:Transabdominal  PERFORMED BY: Myself IMAGES ARCHIVED?: Yes LIMITATIONS: Body habitus and Emergent procedure PREGNANCY FREE FLUID: Present ADNEXAL FINDINGS:Left ovary not seen and Right ovary not seen GESTATIONAL AGE, ESTIMATE: 29 weeks   FETAL HEART RATE: 158 INTERPRETATION: Yolk sac noted, Fetal pole present and Fetal heart activity seen     Final Clinical Impressions(s) / ED Diagnoses   Final diagnoses:  Vaginal bleeding in pregnancy    ED Discharge Orders    None       Akash Winski, Jeannett Senior, MD 08/24/18 0120

## 2018-08-23 NOTE — Progress Notes (Signed)
[facility] called about patient who presents to [facility] ED with complaints of vaginal bleeding that has soaked through 3-4 pads since noon; decreased fetal movement and cramping; patient is a G3P1 who is 30 and 6/[redacted] weeks along in her pregnancy at this time; EFM applied by ED staff but due to equipment problem unable to display via obix; audible heart tones noted in the 150s; Dr Adrian Blackwater notified of patient and c/o; orders given to transfer to MAU at this time

## 2018-08-23 NOTE — ED Notes (Signed)
[facility] Christy stated pt will need to go to [facility] hospital for observation. MD [facility] accepting provider

## 2018-08-24 ENCOUNTER — Inpatient Hospital Stay (HOSPITAL_BASED_OUTPATIENT_CLINIC_OR_DEPARTMENT_OTHER)

## 2018-08-24 ENCOUNTER — Encounter (HOSPITAL_COMMUNITY): Payer: Self-pay | Admitting: Student

## 2018-08-24 DIAGNOSIS — Z3A30 30 weeks gestation of pregnancy: Secondary | ICD-10-CM

## 2018-08-24 DIAGNOSIS — O4693 Antepartum hemorrhage, unspecified, third trimester: Secondary | ICD-10-CM | POA: Diagnosis present

## 2018-08-24 DIAGNOSIS — O99333 Smoking (tobacco) complicating pregnancy, third trimester: Secondary | ICD-10-CM | POA: Diagnosis not present

## 2018-08-24 DIAGNOSIS — A5901 Trichomonal vulvovaginitis: Secondary | ICD-10-CM | POA: Diagnosis not present

## 2018-08-24 DIAGNOSIS — O98313 Other infections with a predominantly sexual mode of transmission complicating pregnancy, third trimester: Secondary | ICD-10-CM | POA: Diagnosis not present

## 2018-08-24 DIAGNOSIS — Z880 Allergy status to penicillin: Secondary | ICD-10-CM | POA: Diagnosis not present

## 2018-08-24 DIAGNOSIS — O23593 Infection of other part of genital tract in pregnancy, third trimester: Secondary | ICD-10-CM

## 2018-08-24 LAB — GC/CHLAMYDIA PROBE AMP (~~LOC~~) NOT AT ARMC
Chlamydia: NEGATIVE
Neisseria Gonorrhea: NEGATIVE

## 2018-08-24 LAB — URINALYSIS, ROUTINE W REFLEX MICROSCOPIC
Bacteria, UA: NONE SEEN
Bilirubin Urine: NEGATIVE
Glucose, UA: NEGATIVE mg/dL
Ketones, ur: NEGATIVE mg/dL
Leukocytes, UA: NEGATIVE
Nitrite: NEGATIVE
Protein, ur: NEGATIVE mg/dL
Specific Gravity, Urine: 1.017 (ref 1.005–1.030)
pH: 5 (ref 5.0–8.0)

## 2018-08-24 LAB — WET PREP, GENITAL
Sperm: NONE SEEN
Yeast Wet Prep HPF POC: NONE SEEN

## 2018-08-24 LAB — OB RESULTS CONSOLE GC/CHLAMYDIA: Gonorrhea: NEGATIVE

## 2018-08-24 IMAGING — US US MFM OB LIMITED
1 series · 15 of 25 positions shown · non-contrast
Comparison: none

[Series 1: us mfm ob limited · 15 of 25 slices shown]
[im 1/25]
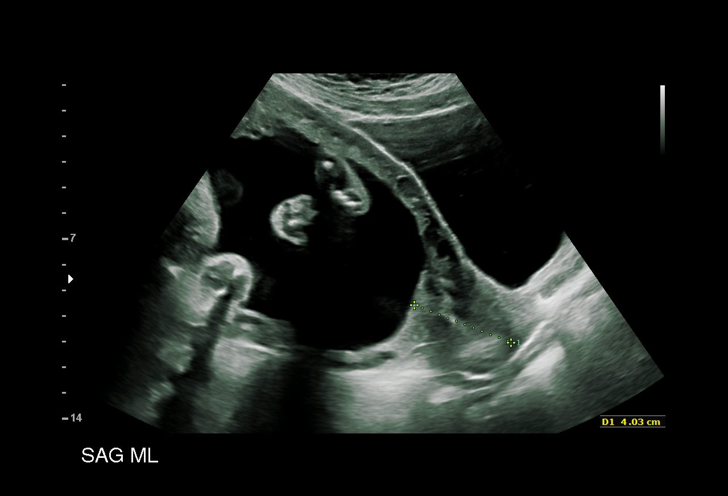
[im 3/25]
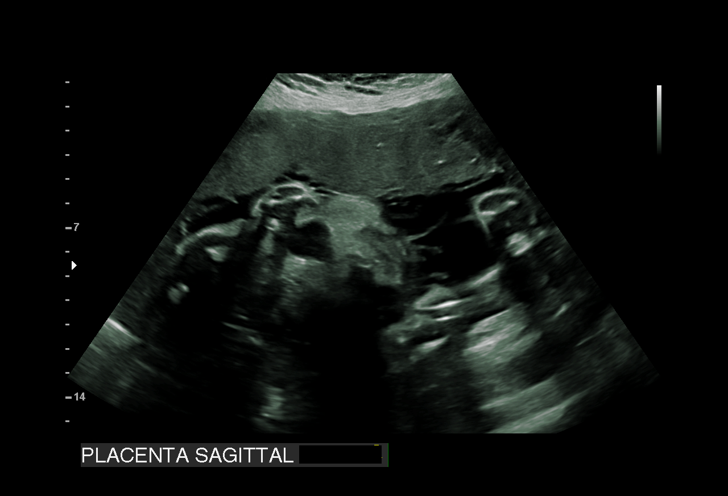
[im 5/25]
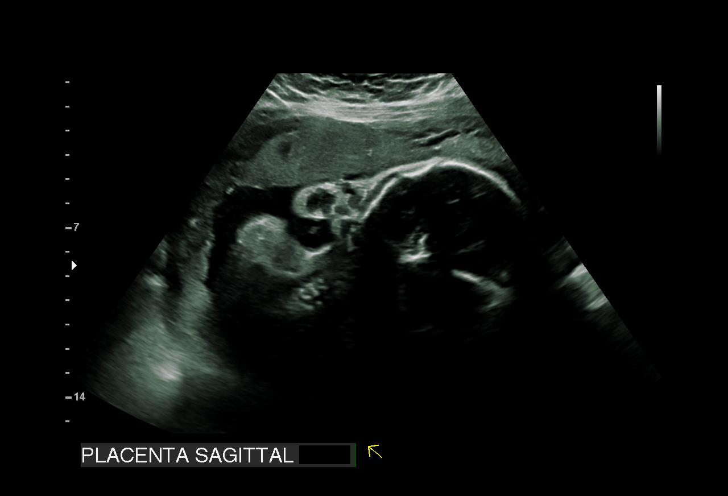
[im 6/25]
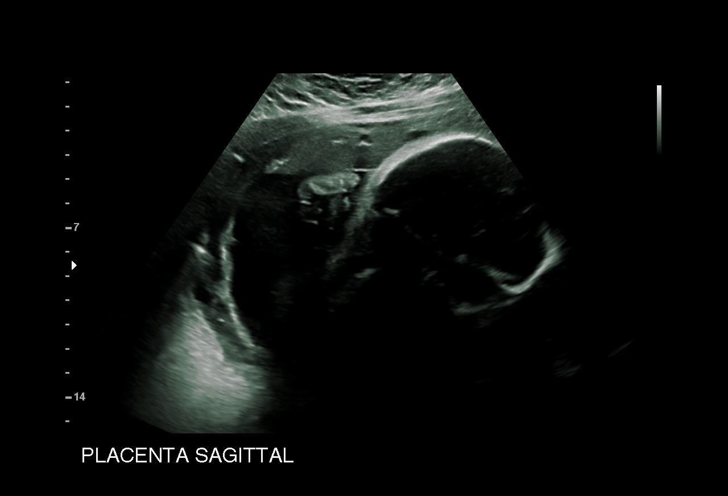
[im 8/25]
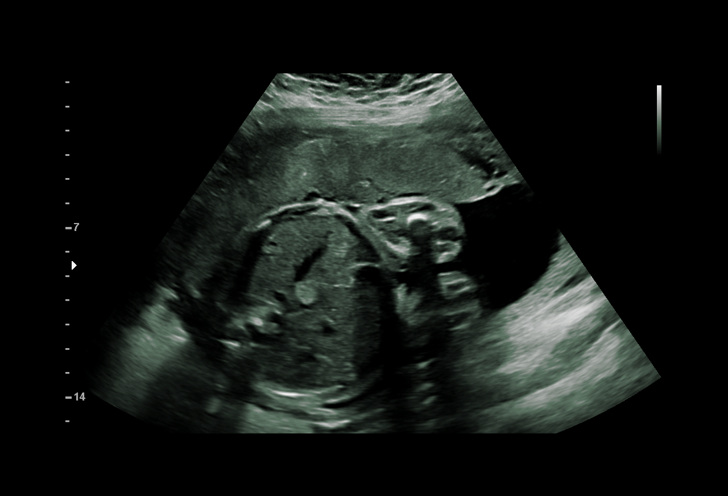
[im 10/25]
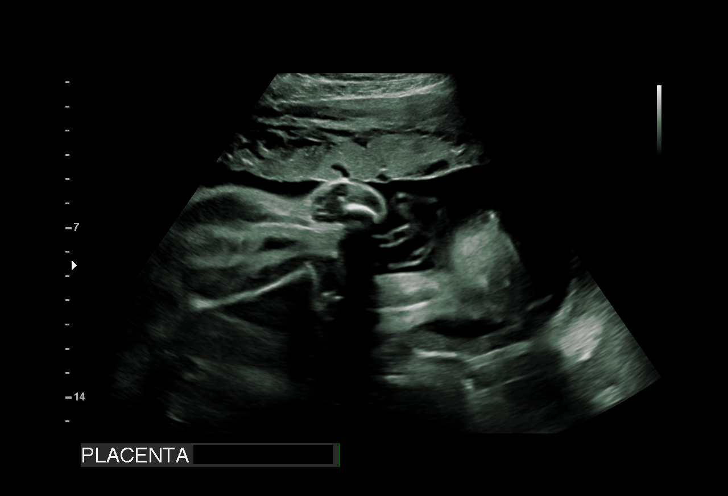
[im 11/25]
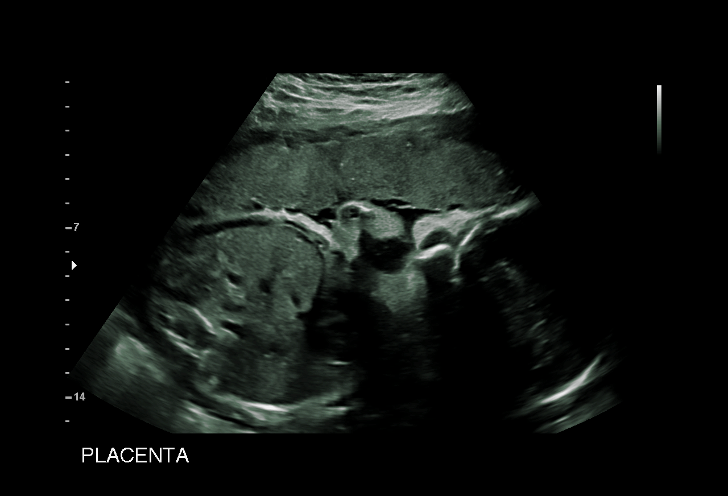
[im 13/25]
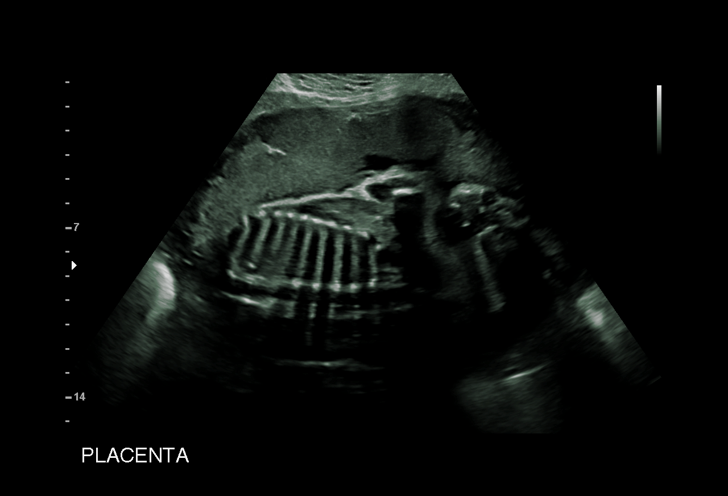
[im 15/25]
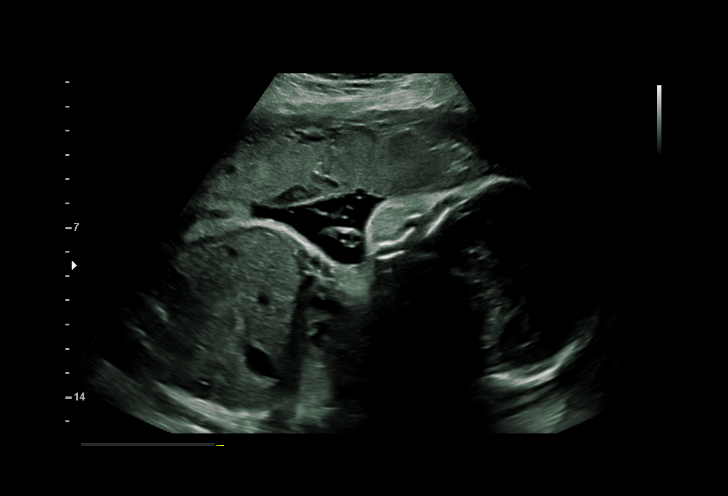
[im 16/25]
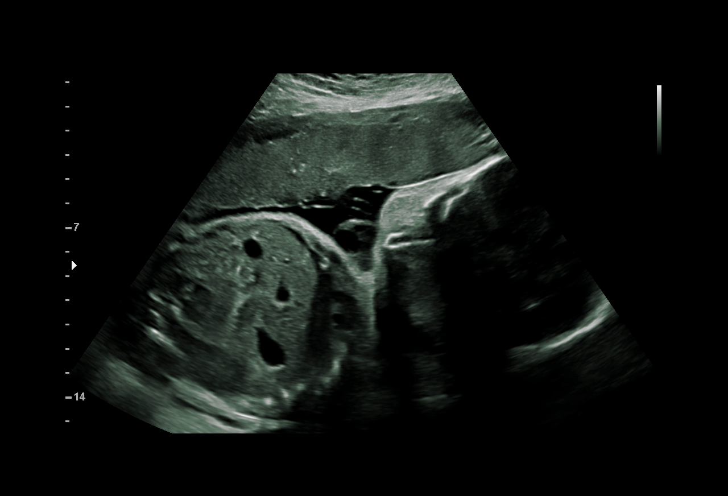
[im 18/25]
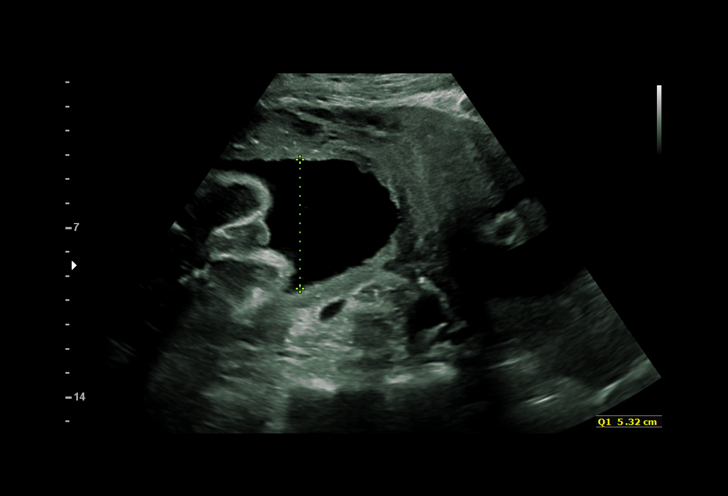
[im 20/25]
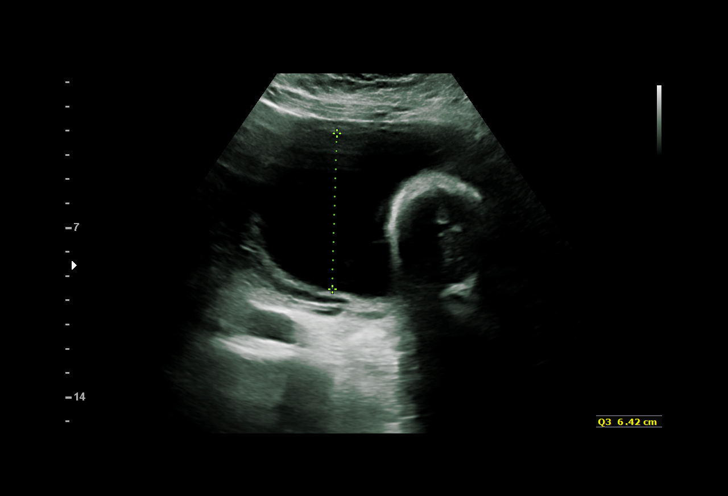
[im 21/25]
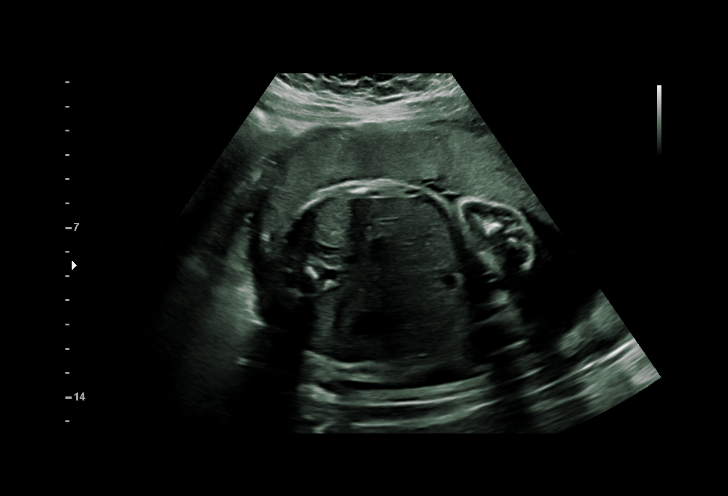
[im 23/25]
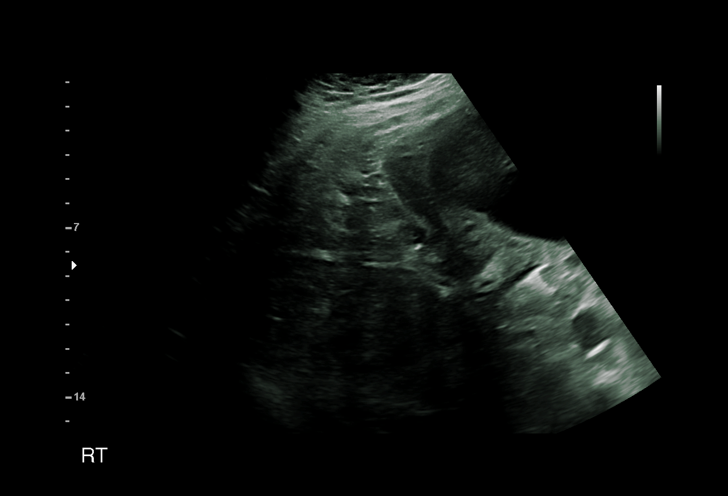
[im 25/25]
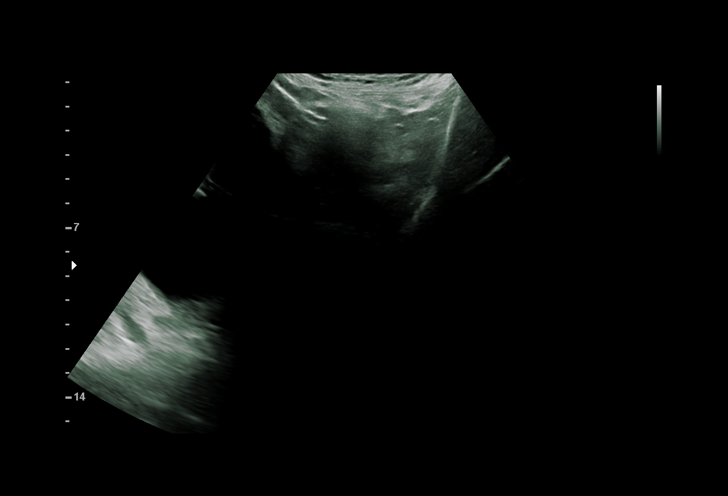

[15 of 25 positions shown; findings below may reference images not displayed]

Indications

Vaginal bleeding in pregnancy, third trimester [K0]
30 weeks gestation of pregnancy
Seizure disorder - Keppra                      [K0] [K0]
Marijuana Abuse
Other mental disorder complicating             [K0]
pregnancy, third trimester PTSD, ADHD,
Anxiety, Depression, ODD
History of fetal abnormality in previous       [K0]
pregnancy, currently pregnant - CHD w/
demise at 7 mo
Asthma                                         [K0] [K0]
Smoking complicating pregnancy, third          [K0]
trimester
30 weeks gestation of pregnancy
Fetal Evaluation

Num Of Fetuses:         1
Fetal Heart Rate(bpm):  146
Cardiac Activity:       Observed
Presentation:           Transverse, head to maternal left
Placenta:               Anterior Fundal

Amniotic Fluid
AFI FV:      Within normal limits

AFI Sum(cm)     %Tile       Largest Pocket(cm)
18.23           69

RUQ(cm)                     LUQ(cm)        LLQ(cm)
5.32

Comment:    No placental abruption or previa identified.
OB History
Gravidity:    3         Term:   1         SAB:   1
Living:       1
Gestational Age

LMP:           30w 0d        Date:  [DATE]                 EDD:   [DATE]
Best:          30w 0d     Det. By:  LMP  ([DATE])          EDD:   [DATE]
Cervix Uterus Adnexa

Cervix
Length:              4  cm.
Normal appearance by transabdominal scan.

Left Ovary
Not visualized.

Right Ovary
Not visualized.
Impression

Patient was evaluated for c/o vaginal bleeding and abdominal
pain.
A limited ultrasound study was performed. Amniotic fluid is
normal and good fetal activity is seen.
Placenta looks normal with no evidence of abruption.
Ultrasound has limitations in diagnosing placental abruption.

## 2018-08-24 MED ORDER — NITROFURANTOIN MONOHYD MACRO 100 MG PO CAPS
100.0000 mg | ORAL_CAPSULE | Freq: Once | ORAL | Status: AC
  Administered 2018-08-24: 100 mg via ORAL
  Filled 2018-08-24: qty 1

## 2018-08-24 MED ORDER — METRONIDAZOLE 500 MG PO TABS
2000.0000 mg | ORAL_TABLET | Freq: Once | ORAL | Status: AC
  Administered 2018-08-24: 2000 mg via ORAL
  Filled 2018-08-24: qty 4

## 2018-08-24 NOTE — MAU Note (Signed)
Pt here with c/o abdominal pain and bleeding. Reports some fetal movement. Denies any leaking of fluid. Some vaginal discharge.

## 2018-08-24 NOTE — MAU Provider Note (Signed)
Chief Complaint:  Vaginal Bleeding   First Provider Initiated Contact with Patient 08/24/18 0227     HPI: Erin Krueger is a 22 y.o. G3P1010 at [redacted]w[redacted]d who presents to maternity admissions reporting vaginal bleeding and abdominal cramping. She is currently incarcerated at the [facility] Department. She initially presented to [facility] ED & was transferred here for further evaluation.  Reports bright red bleeding that started at 11 am. States she has nearly filled up 3-4 pads today. Bleeding improved once she got to the hospital initially. Also reports lower abdominal cramping and low back pain.  Had heavy yellow discharge prior to onset of bleeding.  No recent fall or abdominal trauma.  Positive fetal movement.  Location: lower abdomen Quality: cramping Severity: 3/10 in pain scale Duration: 1 day Timing: intermittent Modifying factors: none Associated signs and symptoms: vaginal discharge and bleeding  Past Medical History:  Diagnosis Date  . ADHD (attention deficit hyperactivity disorder)   . Allergy   . Anxiety   . Asthma   . Complication of anesthesia   . Eating disorder   . Headache(784.0)   . Kidney stone   . Migraines   . ODD (oppositional defiant disorder)   . PTSD (post-traumatic stress disorder)   . Seizures (HCC)    diagnosed age 75   OB History  Gravida Para Term Preterm AB Living  3 1 1   1  0  SAB TAB Ectopic Multiple Live Births  1       1    # Outcome Date GA Lbr Len/2nd Weight Sex Delivery Anes PTL Lv  3 Current           2 Term 06/23/17 [redacted]w[redacted]d  2948 g F Vag-Spont EPI N DEC     Birth Comments: cardiac defect  1 SAB            Past Surgical History:  Procedure Laterality Date  . right knee surgery    . TONSILLECTOMY AND ADENOIDECTOMY     Family History  Problem Relation Age of Onset  . Diabetes Mother   . Diabetes Sister   . Breast cancer Maternal Grandmother   . Diabetes Maternal Grandmother   . Glaucoma Maternal Grandmother     Social History   Tobacco Use  . Smoking status: Former Smoker    Types: Cigarettes  . Smokeless tobacco: Never Used  Substance Use Topics  . Alcohol use: Not Currently    Comment: 3-4 glassed vodka/beer(not while pregnant)  . Drug use: Not Currently    Types: Marijuana    Comment: no drug use since pregnant- cocaine last use 2018   Allergies  Allergen Reactions  . Bee Venom Anaphylaxis  . Cashew Nut Oil Anaphylaxis  . Penicillins Anaphylaxis    Has patient had a PCN reaction causing immediate rash, facial/tongue/throat swelling, SOB or lightheadedness with hypotension: Unknown Has patient had a PCN reaction causing severe rash involving mucus membranes or skin necrosis: Unknown Has patient had a PCN reaction that required hospitalization: Unknown Has patient had a PCN reaction occurring within the last 10 years: Unknown If all of the above answers are "NO", then may proceed with Cephalosporin use.   . Prednisone Other (See Comments)    Bleeding nose bleeding and internal bleeding  . Cocoa Butter Rash  . Latex Rash  . Shellfish Allergy Hives and Rash   Medications Prior to Admission  Medication Sig Dispense Refill Last Dose  . albuterol (PROVENTIL HFA;VENTOLIN HFA) 108 (90 Base) MCG/ACT inhaler  Inhale 2 puffs into the lungs every 6 (six) hours as needed for wheezing or shortness of breath. 1 Inhaler 2 Past Month at Unknown time  . levETIRAcetam (KEPPRA) 500 MG tablet Take 2 tablets (1,000 mg total) by mouth 2 (two) times daily. 120 tablet 2 Past Week at Unknown time  . Prenatal Vit-Fe Fumarate-FA (PRENATAL MULTIVITAMIN) TABS tablet Take 1 tablet by mouth daily at 12 noon. 30 tablet 11 08/23/2018 at Unknown time  . ranitidine (ZANTAC) 150 MG tablet Take 1 tablet (150 mg total) by mouth 2 (two) times daily. (Patient taking differently: Take 150 mg by mouth as needed. ) 60 tablet 0 Past Week at Unknown time  . acetaminophen (TYLENOL) 500 MG tablet Take 500 mg by mouth as needed.    Taking  . EPINEPHrine 0.3 mg/0.3 mL IJ SOAJ injection Inject 0.3 mLs (0.3 mg total) into the muscle as needed. 1 Device 0 Taking  . Fructose-Dextrose-Phosphor Acd (EMETROL PO) as needed.    Taking    I have reviewed patient's Past Medical Hx, Surgical Hx, Family Hx, Social Hx, medications and allergies.   ROS:  Review of Systems  Constitutional: Negative.   Gastrointestinal: Positive for abdominal pain.  Genitourinary: Positive for vaginal bleeding and vaginal discharge. Negative for dysuria and hematuria.  Musculoskeletal: Positive for back pain.    Physical Exam   Patient Vitals for the past 24 hrs:  BP Temp Temp src Pulse Resp SpO2 Height Weight  08/24/18 0219 113/64 98.2 F (36.8 C) Oral 76 18 - - -  08/24/18 0030 111/74 - - 65 - 100 % - -  08/23/18 2202 - - - - - - 5\' 7"  (1.702 m) 80.7 kg  08/23/18 2201 119/84 97.9 F (36.6 C) Oral 93 16 94 % - -    Constitutional: Well-developed, well-nourished female in no acute distress.  Cardiovascular: normal rate & rhythm, no murmur Respiratory: normal effort, lung sounds clear throughout GI: Abd soft, non-tender, gravid appropriate for gestational age. Pos BS x 4 MS: Extremities nontender, no edema, normal ROM Neurologic: Alert and oriented x 4.  GU:   Vulva erythematous. Moderate amount of thin frothy yellow discharge. No blood. Cervix closed/thick.   NST:  Baseline: 130 bpm, Variability: Good {> 6 bpm), Accelerations: Reactive and Decelerations: Absent   Labs: Results for orders placed or performed during the hospital encounter of 08/23/18 (from the past 24 hour(s))  Urinalysis, Routine w reflex microscopic     Status: Abnormal   Collection Time: 08/23/18 10:13 PM  Result Value Ref Range   Color, Urine YELLOW YELLOW   APPearance HAZY (A) CLEAR   Specific Gravity, Urine 1.017 1.005 - 1.030   pH 5.0 5.0 - 8.0   Glucose, UA NEGATIVE NEGATIVE mg/dL   Hgb urine dipstick LARGE (A) NEGATIVE   Bilirubin Urine NEGATIVE NEGATIVE    Ketones, ur NEGATIVE NEGATIVE mg/dL   Protein, ur NEGATIVE NEGATIVE mg/dL   Nitrite NEGATIVE NEGATIVE   Leukocytes, UA NEGATIVE NEGATIVE   RBC / HPF 11-20 0 - 5 RBC/hpf   WBC, UA 6-10 0 - 5 WBC/hpf   Bacteria, UA NONE SEEN NONE SEEN   Squamous Epithelial / LPF 0-5 0 - 5   Mucus PRESENT    Hyaline Casts, UA PRESENT   CBC with Differential/Platelet     Status: Abnormal   Collection Time: 08/23/18 11:04 PM  Result Value Ref Range   WBC 13.1 (H) 4.0 - 10.5 K/uL   RBC 3.68 (L) 3.87 - 5.11 MIL/uL  Hemoglobin 11.4 (L) 12.0 - 15.0 g/dL   HCT 52.8 (L) 41.3 - 24.4 %   MCV 92.4 78.0 - 100.0 fL   MCH 31.0 26.0 - 34.0 pg   MCHC 33.5 30.0 - 36.0 g/dL   RDW 01.0 27.2 - 53.6 %   Platelets 188 150 - 400 K/uL   Neutrophils Relative % 65 %   Neutro Abs 8.6 (H) 1.7 - 7.7 K/uL   Lymphocytes Relative 26 %   Lymphs Abs 3.4 0.7 - 4.0 K/uL   Monocytes Relative 8 %   Monocytes Absolute 1.1 (H) 0.1 - 1.0 K/uL   Eosinophils Relative 1 %   Eosinophils Absolute 0.1 0.0 - 0.7 K/uL   Basophils Relative 0 %   Basophils Absolute 0.0 0.0 - 0.1 K/uL  Basic metabolic panel     Status: None   Collection Time: 08/23/18 11:04 PM  Result Value Ref Range   Sodium 137 135 - 145 mmol/L   Potassium 3.7 3.5 - 5.1 mmol/L   Chloride 107 98 - 111 mmol/L   CO2 22 22 - 32 mmol/L   Glucose, Bld 90 70 - 99 mg/dL   BUN 9 6 - 20 mg/dL   Creatinine, Ser 6.44 0.44 - 1.00 mg/dL   Calcium 9.0 8.9 - 03.4 mg/dL   GFR calc non Af Amer >60 >60 mL/min   GFR calc Af Amer >60 >60 mL/min   Anion gap 8 5 - 15  ABO/Rh     Status: None   Collection Time: 08/23/18 11:04 PM  Result Value Ref Range   ABO/RH(D)      O POS Performed at [facility], [address] [place], [place] [address]   Wet prep, genital     Status: Abnormal   Collection Time: 08/24/18  2:46 AM  Result Value Ref Range   Yeast Wet Prep HPF POC NONE SEEN NONE SEEN   Trich, Wet Prep PRESENT (A) NONE SEEN   Clue Cells Wet Prep HPF POC PRESENT (A) NONE SEEN    WBC, Wet Prep HPF POC MANY (A) NONE SEEN   Sperm NONE SEEN     Imaging:  No results found.  MAU Course: Orders Placed This Encounter  Procedures  . Urine Culture  . Wet prep, genital  . [place] MFM OB LIMITED  . CBC with Differential/Platelet  . Basic metabolic panel  . Urinalysis, Routine w reflex microscopic  . Pelvic cart  . Fetal monitoring  . ABO/Rh  . Insert peripheral IV  . Discharge patient   Meds ordered this encounter  Medications  . sodium chloride 0.9 % bolus 1,000 mL  . nitrofurantoin (macrocrystal-monohydrate) (MACROBID) capsule 100 mg  . metroNIDAZOLE (FLAGYL) tablet 2,000 mg    MDM: Reactive NST No blood on exam Cervix closed/thick. Abdomen soft & non tender.  Treated for trich while in MAU C/w Dr. Adrian Blackwater regarding exam & pt's complaint of vaginal bleeding. Will get ultrasound; if normal can discharge patient.  Ultrasound shows no evidence of abruption. Normal CL. Normal AFI.   Assessment: 1. Vaginal bleeding in pregnancy   2. [redacted] weeks gestation of pregnancy   3. Vaginal bleeding in pregnancy, third trimester   4. Trichomonal vaginitis during pregnancy in third trimester     Plan: Discharge home in stable condition.  No intercourse x 2 weeks Partner needs tx Pt discharged in sheriff's custody   Allergies as of 08/24/2018      Reactions   Bee Venom Anaphylaxis   Cashew Nut Oil  Anaphylaxis   Penicillins Anaphylaxis   Has patient had a PCN reaction causing immediate rash, facial/tongue/throat swelling, SOB or lightheadedness with hypotension: Unknown Has patient had a PCN reaction causing severe rash involving mucus membranes or skin necrosis: Unknown Has patient had a PCN reaction that required hospitalization: Unknown Has patient had a PCN reaction occurring within the last 10 years: Unknown If all of the above answers are "NO", then may proceed with Cephalosporin use.   Prednisone Other (See Comments)   Bleeding nose bleeding and internal  bleeding   Cocoa Butter Rash   Latex Rash   Shellfish Allergy Hives, Rash      Medication List    TAKE these medications   acetaminophen 500 MG tablet Commonly known as:  TYLENOL Take 500 mg by mouth as needed.   albuterol 108 (90 Base) MCG/ACT inhaler Commonly known as:  PROVENTIL HFA;VENTOLIN HFA Inhale 2 puffs into the lungs every 6 (six) hours as needed for wheezing or shortness of breath.   EMETROL PO as needed.   EPINEPHrine 0.3 mg/0.3 mL Soaj injection Commonly known as:  EPI-PEN Inject 0.3 mLs (0.3 mg total) into the muscle as needed.   levETIRAcetam 500 MG tablet Commonly known as:  KEPPRA Take 2 tablets (1,000 mg total) by mouth 2 (two) times daily.   prenatal multivitamin Tabs tablet Take 1 tablet by mouth daily at 12 noon.   ranitidine 150 MG tablet Commonly known as:  ZANTAC Take 1 tablet (150 mg total) by mouth 2 (two) times daily. What changed:    when to take this  reasons to take this       Judeth Horn, NP 08/24/2018 3:55 AM

## 2018-08-24 NOTE — Discharge Instructions (Signed)

## 2018-08-27 ENCOUNTER — Inpatient Hospital Stay (HOSPITAL_COMMUNITY)
Admission: EM | Admit: 2018-08-27 | Discharge: 2018-08-29 | DRG: 833 | Discharge status: Against Medical Advice | Payer: Medicaid Other | Attending: Family Medicine | Admitting: Family Medicine

## 2018-08-27 ENCOUNTER — Encounter (HOSPITAL_COMMUNITY): Payer: Self-pay | Admitting: Emergency Medicine

## 2018-08-27 DIAGNOSIS — Z3493 Encounter for supervision of normal pregnancy, unspecified, third trimester: Secondary | ICD-10-CM

## 2018-08-27 DIAGNOSIS — R32 Unspecified urinary incontinence: Secondary | ICD-10-CM | POA: Diagnosis present

## 2018-08-27 DIAGNOSIS — O2513 Malnutrition in pregnancy, third trimester: Secondary | ICD-10-CM | POA: Diagnosis present

## 2018-08-27 DIAGNOSIS — J45909 Unspecified asthma, uncomplicated: Secondary | ICD-10-CM | POA: Diagnosis present

## 2018-08-27 DIAGNOSIS — F4310 Post-traumatic stress disorder, unspecified: Secondary | ICD-10-CM | POA: Diagnosis present

## 2018-08-27 DIAGNOSIS — O2343 Unspecified infection of urinary tract in pregnancy, third trimester: Secondary | ICD-10-CM | POA: Diagnosis present

## 2018-08-27 DIAGNOSIS — E876 Hypokalemia: Secondary | ICD-10-CM | POA: Diagnosis present

## 2018-08-27 DIAGNOSIS — Z3A30 30 weeks gestation of pregnancy: Secondary | ICD-10-CM

## 2018-08-27 DIAGNOSIS — O99343 Other mental disorders complicating pregnancy, third trimester: Secondary | ICD-10-CM | POA: Diagnosis present

## 2018-08-27 DIAGNOSIS — O99513 Diseases of the respiratory system complicating pregnancy, third trimester: Secondary | ICD-10-CM | POA: Diagnosis present

## 2018-08-27 DIAGNOSIS — O99283 Endocrine, nutritional and metabolic diseases complicating pregnancy, third trimester: Secondary | ICD-10-CM | POA: Diagnosis present

## 2018-08-27 DIAGNOSIS — R569 Unspecified convulsions: Secondary | ICD-10-CM

## 2018-08-27 DIAGNOSIS — G40409 Other generalized epilepsy and epileptic syndromes, not intractable, without status epilepticus: Secondary | ICD-10-CM | POA: Diagnosis present

## 2018-08-27 DIAGNOSIS — Z833 Family history of diabetes mellitus: Secondary | ICD-10-CM

## 2018-08-27 DIAGNOSIS — O99353 Diseases of the nervous system complicating pregnancy, third trimester: Principal | ICD-10-CM | POA: Diagnosis present

## 2018-08-27 DIAGNOSIS — Z87891 Personal history of nicotine dependence: Secondary | ICD-10-CM

## 2018-08-27 DIAGNOSIS — Z9114 Patient's other noncompliance with medication regimen: Secondary | ICD-10-CM

## 2018-08-27 LAB — CBC WITH DIFFERENTIAL/PLATELET
Abs Immature Granulocytes: 0.1 10*3/uL (ref 0.0–0.1)
Basophils Absolute: 0 10*3/uL (ref 0.0–0.1)
Basophils Relative: 0 %
Eosinophils Absolute: 0.1 10*3/uL (ref 0.0–0.7)
Eosinophils Relative: 1 %
HCT: 33.5 % — ABNORMAL LOW (ref 36.0–46.0)
Hemoglobin: 10.9 g/dL — ABNORMAL LOW (ref 12.0–15.0)
Immature Granulocytes: 1 %
Lymphocytes Relative: 22 %
Lymphs Abs: 3.1 10*3/uL (ref 0.7–4.0)
MCH: 30.6 pg (ref 26.0–34.0)
MCHC: 32.5 g/dL (ref 30.0–36.0)
MCV: 94.1 fL (ref 78.0–100.0)
Monocytes Absolute: 1.1 10*3/uL — ABNORMAL HIGH (ref 0.1–1.0)
Monocytes Relative: 8 %
Neutro Abs: 9.6 10*3/uL — ABNORMAL HIGH (ref 1.7–7.7)
Neutrophils Relative %: 68 %
Platelets: 181 10*3/uL (ref 150–400)
RBC: 3.56 MIL/uL — ABNORMAL LOW (ref 3.87–5.11)
RDW: 12 % (ref 11.5–15.5)
WBC: 14.1 10*3/uL — ABNORMAL HIGH (ref 4.0–10.5)

## 2018-08-27 LAB — URINALYSIS, ROUTINE W REFLEX MICROSCOPIC
Bilirubin Urine: NEGATIVE
Glucose, UA: NEGATIVE mg/dL
Ketones, ur: NEGATIVE mg/dL
Nitrite: NEGATIVE
Protein, ur: NEGATIVE mg/dL
Specific Gravity, Urine: 1.018 (ref 1.005–1.030)
WBC, UA: >50 WBC/hpf — ABNORMAL HIGH (ref 0–5)
pH: 6 (ref 5.0–8.0)

## 2018-08-27 LAB — I-STAT BETA HCG BLOOD, ED (MC, WL, AP ONLY): I-stat hCG, quantitative: >2000 m[IU]/mL — ABNORMAL HIGH (ref <5)

## 2018-08-27 LAB — COMPREHENSIVE METABOLIC PANEL
ALT: 17 U/L (ref 0–44)
AST: 14 U/L — ABNORMAL LOW (ref 15–41)
Albumin: 2.7 g/dL — ABNORMAL LOW (ref 3.5–5.0)
Alkaline Phosphatase: 56 U/L (ref 38–126)
Anion gap: 8 (ref 5–15)
BUN: 5 mg/dL — ABNORMAL LOW (ref 6–20)
CO2: 20 mmol/L — ABNORMAL LOW (ref 22–32)
Calcium: 8.8 mg/dL — ABNORMAL LOW (ref 8.9–10.3)
Chloride: 107 mmol/L (ref 98–111)
Creatinine, Ser: 0.49 mg/dL (ref 0.44–1.00)
GFR calc Af Amer: >60 mL/min (ref >60)
GFR calc non Af Amer: >60 mL/min (ref >60)
Glucose, Bld: 80 mg/dL (ref 70–99)
Potassium: 3.5 mmol/L (ref 3.5–5.1)
Sodium: 135 mmol/L (ref 135–145)
Total Bilirubin: 0.4 mg/dL (ref 0.3–1.2)
Total Protein: 5.5 g/dL — ABNORMAL LOW (ref 6.5–8.1)

## 2018-08-27 LAB — URINE CULTURE: Culture: >=100000 — AB

## 2018-08-27 LAB — PROTEIN / CREATININE RATIO, URINE
Creatinine, Urine: 133.39 mg/dL
Protein Creatinine Ratio: 0.1 mg/mg{Cre} (ref 0.00–0.15)
Total Protein, Urine: 13 mg/dL

## 2018-08-27 LAB — CBG MONITORING, ED: Glucose-Capillary: 81 mg/dL (ref 70–99)

## 2018-08-27 MED ORDER — LEVETIRACETAM IN NACL 1500 MG/100ML IV SOLN
1500.0000 mg | Freq: Once | INTRAVENOUS | Status: AC
  Administered 2018-08-27: 1500 mg via INTRAVENOUS
  Filled 2018-08-27: qty 100

## 2018-08-27 NOTE — ED Notes (Signed)
Changed pt gown / pads under pt.

## 2018-08-27 NOTE — Progress Notes (Signed)
Call to Dr Debroah Loop and updated on patient admission and status. Given EFM tracing and that patient denies contractions. No vaginal bleeding noted. Patient OB cleared.

## 2018-08-27 NOTE — Progress Notes (Signed)
Spoke with ED provider , patient OB cleared. Plan to give patient Keppra tonight, urine obtained for possible UTI.

## 2018-08-27 NOTE — ED Notes (Signed)
Pt c/o blurred vision

## 2018-08-27 NOTE — ED Triage Notes (Signed)
Patient from [facility], having seizures.  Patient has a known seizure disorder.  Patient has not had her Keppra in 10 days.  Patient had 2 seizures with EMS and was given 2.5mg  Midazolam IV.  CBG 105.  Patient states she has been soaking two pads an hour and has not felt baby move since yesterday.

## 2018-08-27 NOTE — ED Notes (Signed)
Pt urinated after having a seizure  Panties  Pants and sheet wet. removed

## 2018-08-27 NOTE — ED Notes (Signed)
Ob rapid response here

## 2018-08-27 NOTE — Progress Notes (Signed)
Called from [facility] ED tosee patient G3P0, [redacted]w[redacted]d, s/p seizure. 2 seizures noted in EMS. Patient stes she hasn't felt baby move in 24 hours and noted bloody vaginal discarge soaking 2 pads per hour. EFM applied, FHR 158, reactive. Patient denies contractions. Patient has history of seizure disorder and takes Keppra.  Patient currently incarcerated and hasn't had Keppra in 10 days. Labs drawn. Will continue to monitor EFM.

## 2018-08-27 NOTE — ED Provider Notes (Signed)
[facility] EMERGENCY DEPARTMENT Provider Note   CSN: [id number] Arrival date & time: 08/27/18  2132     History   Chief Complaint Chief Complaint  Patient presents with  . Seizures    HPI Haeli Gerlich is a 22 y.o. female.  HPI   Patient is a 22 year old G81P0 female with PMHx of DHD, asthma, seizure on Keppra, and currently pregnant at [redacted] weeks who presents with seizure.  Patient is currently incarcerated and has not received Keppra in 10 days.  She was noted to have a total of 5 seizures today described as generalized tonic-clonic.  Of those 5 seizures 2 were witnessed by EMS and she was given IV 2.5 mg Versed with resolution.  Noted to be incontinent of urine.  No tongue lacerations.  No falls as patient was assisted to the ground.  VS and poc glc WNL.  Patient denies any ongoing pregnancy complications specifically no preeclampsia or eclampsia.  She states she has not the baby move in over 24 hours and reports bloody vaginal discharge for the last couple of days soaking 2 pads per hour.  No contractions or loss of fluid reported.  Patient currently complains of blurred vision and mild gradual onset HA.  Of note patient seen for vaginal bleeding and abdominal cramping on 08/24/2018 and was evaluated at [facility].  At that time had reactive NST, no blood on pelvic exam, cervix closed/thick/high, abdomen benign, and had a normal ultrasound (no evidence of abruption, normal CL, normal AFI).  She was previously treated for trichomoniasis denies and [facility] back to jail.  Past Medical History:  Diagnosis Date  . ADHD (attention deficit hyperactivity disorder)   . Allergy   . Anxiety   . Asthma   . Complication of anesthesia   . Eating disorder   . Headache(784.0)   . Kidney stone   . Migraines   . ODD (oppositional defiant disorder)   . PTSD (post-traumatic stress disorder)   . Seizures [facility])    diagnosed age 89    Patient Active Problem  List   Diagnosis Date Noted  . Persistent UTI (urinary tract infection) during pregnancy 04/08/2018  . Marijuana use 04/07/2018  . Supervision of normal pregnancy 04/10/18  . Smoker 04-10-18  . Death of child 04-10-18  . 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 PID (acute pelvic inflammatory disease) 01/29/2016  . Seizure disorder (HCC) 01/07/2016  . Seizure (HCC) 01/07/2016  . 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 PTSD (post-traumatic stress disorder) 05/12/2013  . 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 Polysubstance abuse (HCC) 05/12/2013    Past Surgical History:  Procedure Laterality Date  . right knee surgery    . TONSILLECTOMY AND ADENOIDECTOMY       OB History    Gravida  3   Para  1   Term  1   Preterm      AB  1   Living  0     SAB  1   TAB      Ectopic      Multiple      Live Births  1            Home Medications    Prior to Admission medications   Medication Sig Start Date End Date Taking? Authorizing Provider  acetaminophen (TYLENOL) 500 MG tablet Take 500 mg by mouth as needed.    [provider]  albuterol (PROVENTIL HFA;VENTOLIN HFA) 108 (90 Base) MCG/ACT  inhaler Inhale 2 puffs into the lungs every 6 (six) hours as needed for wheezing or shortness of breath. 04/05/18   Cheral Marker, CNM  EPINEPHrine 0.3 mg/0.3 mL IJ SOAJ injection Inject 0.3 mLs (0.3 mg total) into the muscle as needed. 06/27/18   Cheral Marker, CNM  Fructose-Dextrose-Phosphor Acd (EMETROL PO) as needed.     [provider]  levETIRAcetam (KEPPRA) 500 MG tablet Take 2 tablets (1,000 mg total) by mouth 2 (two) times daily. 05/31/18   Cheral Marker, CNM  Prenatal Vit-Fe Fumarate-FA (PRENATAL MULTIVITAMIN) TABS tablet Take 1 tablet by mouth daily at 12 noon. 04/26/18   Cresenzo-Dishmon, Scarlette Calico, CNM  ranitidine (ZANTAC) 150 MG tablet Take 1 tablet (150 mg total) by mouth 2 (two) times daily. Patient  taking differently: Take 150 mg by mouth as needed.  04/13/18   Eber Hong, MD    Family History Family History  Problem Relation Age of Onset  . Diabetes Mother   . Diabetes Sister   . Breast cancer Maternal Grandmother   . Diabetes Maternal Grandmother   . Glaucoma Maternal Grandmother     Social History Social History   Tobacco Use  . Smoking status: Former Smoker    Types: Cigarettes  . Smokeless tobacco: Never Used  Substance Use Topics  . Alcohol use: Not Currently    Comment: 3-4 glassed vodka/beer(not while pregnant)  . Drug use: Not Currently    Types: Marijuana    Comment: no drug use since pregnant- cocaine last use 2018     Allergies   Bee venom; Cashew nut oil; Penicillins; Prednisone; Cocoa butter; Latex; and Shellfish allergy   Review of Systems Review of Systems  Constitutional: Negative for chills and fever.  HENT: Negative for rhinorrhea and sore throat.   Eyes: Positive for visual disturbance (blurred). Negative for pain.  Respiratory: Negative for cough and shortness of breath.   Cardiovascular: Negative for chest pain and palpitations.  Gastrointestinal: Negative for abdominal pain, diarrhea and vomiting.  Genitourinary: Positive for vaginal discharge. Negative for dysuria and hematuria.  Musculoskeletal: Negative for arthralgias and back pain.  Skin: Negative for color change and rash.  Neurological: Positive for seizures.  All other systems reviewed and are negative.    Physical Exam Updated Vital Signs BP 105/64   Pulse 75   Temp 98.3 F (36.8 C) (Oral)   Resp 14   LMP 01/26/2018   SpO2 98%   Physical Exam  Constitutional: She is oriented to person, place, and time. She appears well-developed and well-nourished. No distress.  HENT:  Head: Normocephalic and atraumatic.  Mouth/Throat: Oropharynx is clear and moist.  Eyes: Pupils are equal, round, and reactive to light. Conjunctivae and EOM are normal.  Neck: Normal range of  motion. Neck supple.  Cardiovascular: Normal rate, regular rhythm and intact distal pulses.  Pulmonary/Chest: Effort normal and breath sounds normal. No respiratory distress.  Abdominal: Soft. She exhibits no distension. There is no tenderness. There is no guarding.  Gravid abdomen.  Genitourinary:  Genitourinary Comments: Pelvic performed by OB and noted to have no vaginal bleeding.  Musculoskeletal: Normal range of motion. She exhibits no edema.  Neurological: She is alert and oriented to person, place, and time. She has normal strength. No cranial nerve deficit or sensory deficit. Coordination normal. GCS eye subscore is 4. GCS verbal subscore is 5. GCS motor subscore is 6.  Mildly postictal.  Skin: Skin is warm and dry. Capillary refill takes less than 2 seconds. No  rash noted.  Psychiatric: She has a normal mood and affect.  Nursing note and vitals reviewed.    ED Treatments / Results  Labs (all labs ordered are listed, but only abnormal results are displayed) Labs Reviewed  CBC WITH DIFFERENTIAL/PLATELET - Abnormal; Notable for the following components:      Result Value   WBC 14.1 (*)    RBC 3.56 (*)    Hemoglobin 10.9 (*)    HCT 33.5 (*)    Neutro Abs 9.6 (*)    Monocytes Absolute 1.1 (*)    All other components within normal limits  COMPREHENSIVE METABOLIC PANEL - Abnormal; Notable for the following components:   CO2 20 (*)    BUN 5 (*)    Calcium 8.8 (*)    Total Protein 5.5 (*)    Albumin 2.7 (*)    AST 14 (*)    All other components within normal limits  URINALYSIS, ROUTINE W REFLEX MICROSCOPIC - Abnormal; Notable for the following components:   APPearance CLOUDY (*)    Hgb urine dipstick MODERATE (*)    Leukocytes, UA LARGE (*)    WBC, UA >50 (*)    Bacteria, UA RARE (*)    All other components within normal limits  I-STAT BETA HCG BLOOD, ED ([facility], [facility], [facility] ONLY) - Abnormal; Notable for the following components:   I-stat hCG, quantitative >2,000.0 (*)    All  other components within normal limits  URINE CULTURE  PROTEIN / CREATININE RATIO, URINE  CBG MONITORING, ED  CBG MONITORING, ED    EKG EKG Interpretation  Date/Time:  Saturday August 27 2018 21:42:13 EDT Ventricular Rate:  87 PR Interval:    QRS Duration: 95 QT Interval:  332 QTC Calculation: 400 R Axis:   71 Text Interpretation:  Sinus rhythm Borderline T wave abnormalities Baseline wander in lead(s) II III aVR aVL aVF Confirmed by Blane Ohara [phone]) on 08/27/2018 9:56:52 PM   Radiology No results found.  Procedures Procedures (including critical care time)  Medications Ordered in ED Medications  levETIRAcetam (KEPPRA) IVPB 1500 mg/ 100 mL premix (0 mg Intravenous Stopped 08/27/18 2341)     Initial Impression / Assessment and Plan / ED Course  I have reviewed the triage vital signs and the nursing notes.  Pertinent labs & imaging results that were available during my care of the patient were reviewed by me and considered in my medical decision making (see chart for details).      Patient is a 22 year old G77P0 female with PMHx of DHD, asthma, seizure on Keppra, and currently pregnant at [redacted] weeks who presents with seizures x 5, typical of prior and occurring in the setting of medication nonadherence as she is incarcerated.  No keppra in 10 days.  On arrival she is HDS and normotensive.  Exam unremarkable as above. No focal deficits appreciated.  Patient covered in urine.    Will load with keppra in ED and observe for 3-4 hours.  Basic labs obtained and grossly unremarkable.  No obvious electrolyte disturbances.  No evidence of HELLP or preclampsia/eclampsia.  Patient BP stable and UA without protein.  UA with dirty specimen given epithelial cells.  No evidence for infection as nitrite negative.  Hgb, RBC, and leukocytes present.  UC pending.  OB evaluated patient in ED.  Fetal heart tracing obtained and wnl.  Exam unremarkable without vaginal bleeding.  Cleared by OB.  Of  note patient recently evaluated by [facility] on 08/24/18 for vaginal bleeding.  Unremarkable work-up at that time.  2300 - Patient had another witnessed generalized tonic clonic seizure lasting < 1 minute and resolved spontaneously.  Will admit to hospitalist with neurology consulting given continued breakthrough seizure after keppra load.  Awaiting inpatient bed.  Final Clinical Impressions(s) / ED Diagnoses   Final diagnoses:  Seizure [facility])  Third trimester pregnancy    ED Discharge Orders    None       Abelardo Diesel, MD 08/28/18 1610    Blane Ohara, MD 08/29/18 Glena [place]    Blane Ohara, MD 09/05/18 [phone]

## 2018-08-27 NOTE — ED Notes (Signed)
Notified OB Rapid Response 

## 2018-08-28 ENCOUNTER — Encounter (HOSPITAL_COMMUNITY): Payer: Self-pay | Admitting: *Deleted

## 2018-08-28 ENCOUNTER — Other Ambulatory Visit: Payer: Self-pay

## 2018-08-28 DIAGNOSIS — R569 Unspecified convulsions: Secondary | ICD-10-CM

## 2018-08-28 DIAGNOSIS — Z87891 Personal history of nicotine dependence: Secondary | ICD-10-CM | POA: Diagnosis not present

## 2018-08-28 DIAGNOSIS — J45909 Unspecified asthma, uncomplicated: Secondary | ICD-10-CM | POA: Diagnosis present

## 2018-08-28 DIAGNOSIS — F4310 Post-traumatic stress disorder, unspecified: Secondary | ICD-10-CM | POA: Diagnosis present

## 2018-08-28 DIAGNOSIS — O2513 Malnutrition in pregnancy, third trimester: Secondary | ICD-10-CM | POA: Diagnosis present

## 2018-08-28 DIAGNOSIS — R32 Unspecified urinary incontinence: Secondary | ICD-10-CM | POA: Diagnosis present

## 2018-08-28 DIAGNOSIS — N3000 Acute cystitis without hematuria: Secondary | ICD-10-CM | POA: Diagnosis not present

## 2018-08-28 DIAGNOSIS — Z833 Family history of diabetes mellitus: Secondary | ICD-10-CM | POA: Diagnosis not present

## 2018-08-28 DIAGNOSIS — O2343 Unspecified infection of urinary tract in pregnancy, third trimester: Secondary | ICD-10-CM | POA: Diagnosis present

## 2018-08-28 DIAGNOSIS — Z3493 Encounter for supervision of normal pregnancy, unspecified, third trimester: Secondary | ICD-10-CM | POA: Diagnosis not present

## 2018-08-28 DIAGNOSIS — Z9114 Patient's other noncompliance with medication regimen: Secondary | ICD-10-CM | POA: Diagnosis not present

## 2018-08-28 DIAGNOSIS — O99283 Endocrine, nutritional and metabolic diseases complicating pregnancy, third trimester: Secondary | ICD-10-CM | POA: Diagnosis present

## 2018-08-28 DIAGNOSIS — Z3A30 30 weeks gestation of pregnancy: Secondary | ICD-10-CM | POA: Diagnosis not present

## 2018-08-28 DIAGNOSIS — O99353 Diseases of the nervous system complicating pregnancy, third trimester: Secondary | ICD-10-CM | POA: Diagnosis not present

## 2018-08-28 DIAGNOSIS — O99513 Diseases of the respiratory system complicating pregnancy, third trimester: Secondary | ICD-10-CM | POA: Diagnosis present

## 2018-08-28 DIAGNOSIS — G40409 Other generalized epilepsy and epileptic syndromes, not intractable, without status epilepticus: Secondary | ICD-10-CM | POA: Diagnosis present

## 2018-08-28 DIAGNOSIS — E876 Hypokalemia: Secondary | ICD-10-CM | POA: Diagnosis present

## 2018-08-28 DIAGNOSIS — O99343 Other mental disorders complicating pregnancy, third trimester: Secondary | ICD-10-CM | POA: Diagnosis present

## 2018-08-28 LAB — CBC
HCT: 33.7 % — ABNORMAL LOW (ref 36.0–46.0)
Hemoglobin: 11 g/dL — ABNORMAL LOW (ref 12.0–15.0)
MCH: 30.9 pg (ref 26.0–34.0)
MCHC: 32.6 g/dL (ref 30.0–36.0)
MCV: 94.7 fL (ref 78.0–100.0)
Platelets: 169 10*3/uL (ref 150–400)
RBC: 3.56 MIL/uL — ABNORMAL LOW (ref 3.87–5.11)
RDW: 11.9 % (ref 11.5–15.5)
WBC: 13.5 10*3/uL — ABNORMAL HIGH (ref 4.0–10.5)

## 2018-08-28 LAB — BASIC METABOLIC PANEL
Anion gap: 9 (ref 5–15)
BUN: <5 mg/dL — ABNORMAL LOW (ref 6–20)
CO2: 20 mmol/L — ABNORMAL LOW (ref 22–32)
Calcium: 8.6 mg/dL — ABNORMAL LOW (ref 8.9–10.3)
Chloride: 107 mmol/L (ref 98–111)
Creatinine, Ser: 0.46 mg/dL (ref 0.44–1.00)
GFR calc Af Amer: >60 mL/min (ref >60)
GFR calc non Af Amer: >60 mL/min (ref >60)
Glucose, Bld: 86 mg/dL (ref 70–99)
Potassium: 3.4 mmol/L — ABNORMAL LOW (ref 3.5–5.1)
Sodium: 136 mmol/L (ref 135–145)

## 2018-08-28 LAB — MRSA PCR SCREENING: MRSA by PCR: POSITIVE — AB

## 2018-08-28 MED ORDER — CHLORHEXIDINE GLUCONATE CLOTH 2 % EX PADS
6.0000 | MEDICATED_PAD | Freq: Every day | CUTANEOUS | Status: DC
  Administered 2018-08-28 – 2018-08-29 (×2): 6 via TOPICAL

## 2018-08-28 MED ORDER — SODIUM CHLORIDE 0.9 % IV SOLN
INTRAVENOUS | Status: DC
  Administered 2018-08-28 – 2018-08-29 (×3): via INTRAVENOUS

## 2018-08-28 MED ORDER — LEVETIRACETAM 500 MG PO TABS
1000.0000 mg | ORAL_TABLET | Freq: Two times a day (BID) | ORAL | Status: DC
  Filled 2018-08-28: qty 2

## 2018-08-28 MED ORDER — METOCLOPRAMIDE HCL 5 MG/ML IJ SOLN
10.0000 mg | Freq: Once | INTRAMUSCULAR | Status: AC
  Administered 2018-08-28: 10 mg via INTRAVENOUS
  Filled 2018-08-28: qty 2

## 2018-08-28 MED ORDER — POTASSIUM CHLORIDE CRYS ER 20 MEQ PO TBCR
20.0000 meq | EXTENDED_RELEASE_TABLET | Freq: Two times a day (BID) | ORAL | Status: DC
  Administered 2018-08-28 (×2): 20 meq via ORAL
  Filled 2018-08-28 (×3): qty 1

## 2018-08-28 MED ORDER — PRENATAL MULTIVITAMIN CH
1.0000 | ORAL_TABLET | Freq: Every day | ORAL | Status: DC
  Administered 2018-08-28 – 2018-08-29 (×2): 1 via ORAL
  Filled 2018-08-28 (×2): qty 1

## 2018-08-28 MED ORDER — LEVETIRACETAM 500 MG PO TABS: 1000.0000 mg | ORAL_TABLET | Freq: Two times a day (BID) | ORAL | Status: DC

## 2018-08-28 MED ORDER — NITROFURANTOIN MONOHYD MACRO 100 MG PO CAPS: 100.0000 mg | ORAL_CAPSULE | Freq: Two times a day (BID) | ORAL | Status: DC

## 2018-08-28 MED ORDER — ACETAMINOPHEN 325 MG PO TABS
650.0000 mg | ORAL_TABLET | Freq: Four times a day (QID) | ORAL | Status: DC | PRN
  Administered 2018-08-28 – 2018-08-29 (×3): 650 mg via ORAL
  Filled 2018-08-28 (×3): qty 2

## 2018-08-28 MED ORDER — LEVETIRACETAM 500 MG PO TABS
1000.0000 mg | ORAL_TABLET | Freq: Two times a day (BID) | ORAL | Status: DC
  Administered 2018-08-28 – 2018-08-29 (×4): 1000 mg via ORAL
  Filled 2018-08-28 (×3): qty 2

## 2018-08-28 MED ORDER — MUPIROCIN 2 % EX OINT
1.0000 "application " | TOPICAL_OINTMENT | Freq: Two times a day (BID) | CUTANEOUS | Status: DC
  Administered 2018-08-28 – 2018-08-29 (×3): 1 via NASAL
  Filled 2018-08-28 (×2): qty 22

## 2018-08-28 MED ORDER — CLINDAMYCIN HCL 150 MG PO CAPS
150.0000 mg | ORAL_CAPSULE | Freq: Four times a day (QID) | ORAL | Status: DC
  Administered 2018-08-28 – 2018-08-29 (×6): 150 mg via ORAL
  Filled 2018-08-28 (×9): qty 1

## 2018-08-28 NOTE — ED Notes (Signed)
Report called  To rn on  5w 

## 2018-08-28 NOTE — Consult Note (Signed)
Requesting Physician: Dr. Deirdre Priest    Chief Complaint:  Seizure  History obtained from: Patient and Chart     HPI:                                                                                                                                       Erin Krueger is an 22 y.o. 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  Patient is incarcerated and has been receiving Keppra since last week.  Per ED note she has had multiple seizures approximately 5 today.  2 of those seizures were witnessed by EMS and she received 2.5 mg of IV Versed.  On arrival to emergency room patient was back to her baseline and received 1.5 g of Keppra IV load.  Approximately 30 minutes after receiving Keppra she had another spell -  characterized by generalized shaking, not rhythmic tonic-clonic jerking per prison guard who witnessed the event.  Neurology was consulted.  Seizure history According to the patient she was diagnosed with seizures at 22 years of age and previously on Dilantin.  Switched to Keppra after becoming pregnant.  She has never been well controlled and has seizure-like spells every 1 to 2 months.  She was told at [facility] that some of these spells were nonepileptic.  She denies ever having a long-term EEG monitoring.  She was seen by Dr. Gerilyn Pilgrim in 2017.  Routine EEG at that time showed single sharp wave in the left temporal region.  She has been seen in multiple hospitals including [facility] and to [facility].  States her current neurologist is in [place] and was seen about a month ago however I cannot see find any records on Epic "Care  Everywhere".    Past Medical History:  Diagnosis Date  . ADHD (attention deficit hyperactivity disorder)   . Allergy   . Anxiety   . Asthma   . Complication of anesthesia   . Eating  disorder   . Headache(784.0)   . Kidney stone   . Migraines   . ODD (oppositional defiant disorder)   . PTSD (post-traumatic stress disorder)   . Seizures (HCC)    diagnosed age 33    Past Surgical History:  Procedure Laterality Date  . right knee surgery    . TONSILLECTOMY AND ADENOIDECTOMY      Family History  Problem Relation Age of Onset  . Diabetes Mother   . Diabetes Sister   . Breast cancer Maternal Grandmother   . Diabetes Maternal Grandmother   . Glaucoma Maternal Grandmother    Social History:  reports that she has quit smoking. Her smoking use included cigarettes. She has never used smokeless tobacco. She reports that she drank alcohol. She reports that she has current or past drug history.  Drug: Marijuana.  Allergies:  Allergies  Allergen Reactions  . Bee Venom Anaphylaxis  . Cashew Nut Oil Anaphylaxis  . Penicillins Anaphylaxis    Has patient had a PCN reaction causing immediate rash, facial/tongue/throat swelling, SOB or lightheadedness with hypotension: Unknown Has patient had a PCN reaction causing severe rash involving mucus membranes or skin necrosis: Unknown Has patient had a PCN reaction that required hospitalization: Unknown Has patient had a PCN reaction occurring within the last 10 years: Unknown If all of the above answers are "NO", then may proceed with Cephalosporin use.   . Prednisone Other (See Comments)    Bleeding nose bleeding and internal bleeding  . Cocoa Butter Rash  . Latex Rash  . Shellfish Allergy Hives and Rash    Medications:                                                                                                                        I reviewed home medications   ROS:                                                                                                                                     14 systems reviewed and negative except above   Examination:                                                                                                       General: Appears well-developed and well-nourished.  Psych: Affect appropriate to situation Eyes: No scleral injection HENT: No OP obstrucion Head: Normocephalic.  Cardiovascular: Normal rate and regular rhythm.  Respiratory: Effort normal and breath sounds normal to anterior ascultation GI: Soft.  Distended due to pregnancy.   Skin: WDI    Neurological Examination Mental Status: Alert, oriented, thought content appropriate.  Speech fluent without evidence of aphasia. Able to follow 3 step commands without difficulty. Cranial Nerves: II: Visual fields grossly normal,  III,IV, VI: ptosis not present, extra-ocular motions intact bilaterally, pupils equal, round,  reactive to light and accommodation V,VII: smile symmetric, facial light touch sensation normal bilaterally VIII: hearing normal bilaterally IX,X: uvula rises symmetrically XI: bilateral shoulder shrug XII: midline tongue extension Motor: Right : Upper extremity   5/5    Left:     Upper extremity   5/5  Lower extremity   5/5     Lower extremity   5/5 Tone and bulk:normal tone throughout; no atrophy noted Sensory:  Deep Tendon Reflexes: 2+ and symmetric throughout Plantars: Right: downgoing   Left: downgoing Cerebellar: normal finger-to-nose, normal rapid alternating movements and normal heel-to-shin test Gait: normal gait and station     Lab Results: Basic Metabolic Panel: Recent Labs  Lab 08/23/18 2304 08/27/18 2223  NA 137 135  K 3.7 3.5  CL 107 107  CO2 22 20*  GLUCOSE 90 80  BUN 9 5*  CREATININE 0.44 0.49  CALCIUM 9.0 8.8*    CBC: Recent Labs  Lab 08/23/18 2304 08/27/18 2223  WBC 13.1* 14.1*  NEUTROABS 8.6* 9.6*  HGB 11.4* 10.9*  HCT 34.0* 33.5*  MCV 92.4 94.1  PLT 188 181    Coagulation Studies: No results for input(s): LABPROT, INR in the last 72 hours.  Imaging: No results found.   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

## 2018-08-28 NOTE — Progress Notes (Signed)
FPTS Interim Progress Note  S:Paged by nurse for patient having seizures.  Patient seen and examined at bedside.  Nurse notes three episodes of patient shaking, but not full body, that lasted about 90 seconds each with one minute between. Patient notes that she wet herself.  O: BP 106/68 (BP Location: Left Arm)   Pulse 84   Temp 98.3 F (36.8 C) (Oral)   Resp 15   Ht 5\' 7"  (1.702 m)   Wt 80 kg   LMP 01/26/2018   SpO2 96%   BMI 27.62 kg/m   Physical Exam: General: 22 y.o. female in NAD, lynig in bed Cardio: RRR no m/r/g Skin: warm and dry Extremities: No edema, was moving legs under blanket at beginning of exam, but when prompted to move legs she stated "I can't", 4/5 strength BUE Neuro: A&Ox2, states it is September 2018, able to converse and answer questions appropriately      A/P: Breakthrough seizures vs non-epileptic event Per neuro recs, order long term EEG Will give PRN ativan if status epilepticus Continue to monitor    Unknown Jim, DO 08/28/2018, 9:44 PM PGY-1, [facility] pager [phone]

## 2018-08-28 NOTE — H&P (Signed)
Family Medicine Teaching [facility] Admission History and Physical Service Pager: [phone]  Patient name: Erin Krueger Medical record number: [id number] Date of birth: December 10, 1995 Age: 22 y.o. Gender: female  Primary Care Provider: Tilda Burrow, MD Consultants: Neurology, OBGYN Code Status: Full   Chief Complaint: Seizures  Assessment and Plan: Erin Krueger is a 22 y.o. female G3P1010 at [redacted]w[redacted]d with as past medical history significant seizures, PTSD, ODD, ADHD, asthma who presented to [facility] from [facility] jail with multiple seizure episodes in the setting of medication non adherence.   #Seizures, chronic, uncontrolled Patient presented with 5 generalized tonic clonic seizures episodes two of them while in route to the ED. Patient has had urinary incontinence with these episodes.  But has no recollection of these events.  Patient received versed 2.5 mg with resolution.  Patient had one episode of generalized tonic clonic seizures while in the ED lasting less than 1 minute with complete resolution without any medical management.  Patient reports since her incarceration about 10 days ago she has not been unable to take her Keppra (1000 mg bid).  Patient was seen by neurology who recommended continuation of home regimen.  Patient received Keppra load in the ED.  Upon chart review patient has had multiple EEG in the past which have shown seizures and pseudoseizures.  Patient was previously on Dilantin but was switched to Keppra due to her pregnancy.  Patient reports that she is followed by a Neurologist here in [place] but unable to recall his name.  Neurologist is not affiliated with the [facility] --Admit to FMTS, admitting physician Dr. Deirdre Priest --Admit to stepdown unit --Start patient on Keppra 1000 mg twice daily first dose 5 AM --Follow-up on neurology consult appreciate recommendations --If patient continue to have seizure episodes will likely require 24-hour EEG  monitoring per neurology --Continue seizure precautions --Normal saline 75 cc/h --Monitor vitals  #Third trimester pregnancy Patient was evaluated by OB in the ED.  Fetal heart tracing obtained and within normal limits.  Vaginal exam with any bleeding.  Patient was cleared.  Basic labs have been unremarkable with her normal protein creatinine ratio.  Is within normal limits and does not have signs of proteinuria concerning for eclampsia/preeclampsia.  Patient was recently seen in the MAU for vaginal bleeding.  She had an STD which was reactive and pelvic exam was normal.  Ultrasound did not show any evidence of abruption.  Patient was treated was treated for trichomonas on 10/2.  Patient has evidence of UTI with large leuks on UA.  Patient was supposed to be on Macrobid for suppressive therapy. --Continue prenatal vitamins --Continue Macrobid as below --Zantac 150 mg twice daily as needed  #Asthma, chronic, well controlled Patient reports that she has a history of asthma for which she takes Qvar on a daily basis.  Patient has been unable to use Qvar since her incarceration.  On my exam, she has no wheezing or increased work of breathing.  Oxygen saturation 98%. --We will resume Qvar 2 puff twice daily --Follow-up for respiratory status --Oxygen therapy as needed  #Asymptomatic bacteriuria UA on admission showed large leukocytes with negative nitrites.  Patient was recently treated with Macrobid given a history of ESBL bacteria.  Patient was supposed to be on Macrobid suppressive therapy has been nonadherent due to her incarceration.  Patient has leukocytosis WBC 14.1.  Vitals are within normal limits. --Start patient on Macrobid 100 mg twice daily --Follow-up on urine cultures --Patient will likely need suppressive therapy going  forward  #Protein calorie malnutrition Patient Albumin on admission is 2.7.  Suspect poor calorie intake given current incarcerated status.  Consider nutrition  consult prior to discharge.   FEN/GI: Regular diet Prophylaxis: SCDs, no anticoagulation given recent episodes of GI bleeding  Disposition: Admit to Stepdown  History of Present Illness:  Erin Krueger is a 22 y.o. female G3P1010 at [redacted]w[redacted]d with as past medical history significant seizures, PTSD, ODD, ADHD, asthma who presented to [facility] from [facility] jail with multiple generalized tonic-clonic seizure episodes.  Patient has had five episodes this evening of generalized tonic-clonic seizure episodes with two of them occurring during transport with EMS.  Patient received Versed 2.5 mg with complete resolution.  On arrival to the ED patient was mildly confused was unable to remember having any seizures.  Seizures were accompanied with urinary incontinence.  In the ED patient experience another episodes lasting less than a minute which did not require any abortive measures.  Patient reports that she has been nonadherent to her Keppra since she has been incarcerated for the past 10 days.  Patient has had seizures since age of 45 and has been on Dilantin prior to being switched to Keppra when she became pregnant.  Patient has had EEGs in the past which showed epileptic and nonepileptic activities.  She is currently followed by her neurologist in [place].  She has not has any recent EEGs. In the ED, patient was loaded with Keppra.  Neurology was consulted for recommendation to start patient on Keppra 1000 mg 2 times a day.  UA showed large leukocytes.  Patient was recently treated for ESBL bacteria with Macrobid.  She has been adherent to her medication.  She was also recently treated for trichomonas and BV.  Patient was also evaluated by OB/GYN with normal fetal heart tracing, normal pelvic exam without any signs of leakage or vaginal bleeding.  Patient was cleared from an OB standpoint.  Family medicine teaching service was consulted for admission.  Review Of Systems: Per HPI with the following  additions:   Review of Systems  Constitutional: Negative for chills and fever.  Eyes: Positive for blurred vision.  Respiratory: Negative.   Gastrointestinal: Negative for abdominal pain.  Genitourinary: Negative for dysuria, frequency, hematuria and urgency.  Skin: Negative.   Neurological: Positive for headaches.    Patient Active Problem List   Diagnosis Date Noted  . Seizures (HCC) 08/28/2018  . Persistent UTI (urinary tract infection) during pregnancy 04/08/2018  . Marijuana use 04/07/2018  . Supervision of normal pregnancy 2018/05/04  . Smoker May 04, 2018  . Death of child 05-04-18  . 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 PID (acute pelvic inflammatory disease) 01/29/2016  . Seizure disorder (HCC) 01/07/2016  . Seizure (HCC) 01/07/2016  . 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 PTSD (post-traumatic stress disorder) 05/12/2013  . 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 Polysubstance abuse (HCC) 05/12/2013    Past Medical History: Past Medical History:  Diagnosis Date  . ADHD (attention deficit hyperactivity disorder)   . Allergy   . Anxiety   . Asthma   . Complication of anesthesia   . Eating disorder   . Headache(784.0)   . Kidney stone   . Migraines   . ODD (oppositional defiant disorder)   . PTSD (post-traumatic stress disorder)   . Seizures (HCC)    diagnosed age 84    Past Surgical History: Past Surgical History:  Procedure Laterality Date  . right knee surgery    .  TONSILLECTOMY AND ADENOIDECTOMY      Social History: Social History   Tobacco Use  . Smoking status: Former Smoker    Types: Cigarettes  . Smokeless tobacco: Never Used  Substance Use Topics  . Alcohol use: Not Currently    Comment: 3-4 glassed vodka/beer(not while pregnant)  . Drug use: Not Currently    Types: Marijuana    Comment: no drug use since pregnant- cocaine last use 2018   Additional social history:   Please also refer to  relevant sections of EMR.  Family History: Family History  Problem Relation Age of Onset  . Diabetes Mother   . Diabetes Sister   . Breast cancer Maternal Grandmother   . Diabetes Maternal Grandmother   . Glaucoma Maternal Grandmother    (If not completed, MUST add something in)  Allergies and Medications: Allergies  Allergen Reactions  . Bee Venom Anaphylaxis  . Cashew Nut Oil Anaphylaxis  . Penicillins Anaphylaxis    Has patient had a PCN reaction causing immediate rash, facial/tongue/throat swelling, SOB or lightheadedness with hypotension: Unknown Has patient had a PCN reaction causing severe rash involving mucus membranes or skin necrosis: Unknown Has patient had a PCN reaction that required hospitalization: Unknown Has patient had a PCN reaction occurring within the last 10 years: Unknown If all of the above answers are "NO", then may proceed with Cephalosporin use.   . Prednisone Other (See Comments)    Bleeding nose bleeding and internal bleeding  . Cocoa Butter Rash  . Latex Rash  . Shellfish Allergy Hives and Rash   No current facility-administered medications on file prior to encounter.    Current Outpatient Medications on File Prior to Encounter  Medication Sig Dispense Refill  . acetaminophen (TYLENOL) 500 MG tablet Take 500 mg by mouth as needed.    Marland Kitchen albuterol (PROVENTIL HFA;VENTOLIN HFA) 108 (90 Base) MCG/ACT inhaler Inhale 2 puffs into the lungs every 6 (six) hours as needed for wheezing or shortness of breath. 1 Inhaler 2  . EPINEPHrine 0.3 mg/0.3 mL IJ SOAJ injection Inject 0.3 mLs (0.3 mg total) into the muscle as needed. 1 Device 0  . Fructose-Dextrose-Phosphor Acd (EMETROL PO) as needed.     . levETIRAcetam (KEPPRA) 500 MG tablet Take 2 tablets (1,000 mg total) by mouth 2 (two) times daily. 120 tablet 2  . Prenatal Vit-Fe Fumarate-FA (PRENATAL MULTIVITAMIN) TABS tablet Take 1 tablet by mouth daily at 12 noon. 30 tablet 11  . ranitidine (ZANTAC) 150 MG  tablet Take 1 tablet (150 mg total) by mouth 2 (two) times daily. (Patient taking differently: Take 150 mg by mouth as needed. ) 60 tablet 0    Objective: BP 115/74   Pulse 75   Temp 98.3 F (36.8 C) (Oral)   Resp (!) 24   LMP 01/26/2018   SpO2 98%  General: NAD, pleasant, able to participate in exam Cardiac: RRR, normal heart sounds, no murmurs. 2+ radial and PT pulses bilaterally Respiratory: CTAB, normal effort, mild wheezing, rales or rhonchi Abdomen: Gravid abdomen, soft, nontender, nondistended, no hepatic or splenomegaly, +BS Extremities: no edema or cyanosis. WWP. Skin: warm and dry, no rashes noted Neuro: alert and oriented x4, no focal deficits.  Strength and sensation intact upper and lower extremity.  Reflexes 2+. Psych: Normal affect and mood   Labs and Imaging: CBC BMET  Recent Labs  Lab 08/27/18 2223  WBC 14.1*  HGB 10.9*  HCT 33.5*  PLT 181   Recent Labs  Lab 08/27/18 2223  NA 135  K 3.5  CL 107  CO2 20*  BUN 5*  CREATININE 0.49  GLUCOSE 80  CALCIUM 8.8*       Erin Krueger, Lilia Argue, MD 08/28/2018, 1:43 AM PGY-3, [facility] FPTS Intern pager: [phone], text pages welcome

## 2018-08-28 NOTE — ED Notes (Signed)
I was called  Telling me that the pt was having a seizure.  Minutes later I was in the room pt had both eyes closed no jerlking noted  And when I asked her if she had a  Headache and she sl opened her eyes

## 2018-08-28 NOTE — Progress Notes (Signed)
RN paged MD to make them aware that patient had 3 seizures lasting about 90 seconds each with approximately 1 minute break in between. These started around 2110 and finished around 2118.  VS remained stable during this event.  MD states she will come see patient and has ordered an EEG.  RN will continue to monitor patient and make MD aware of any changes.  P.J. Henderson Newcomer, RN

## 2018-08-28 NOTE — Progress Notes (Addendum)
                      NEURO HOSPITALIST PROGRESS NOTE   Subjective: Patient awake, alert, NAD. Guard at bedside. No seizures overnight. Patient complaining of HA that feels like pressure behind her eyes and the front part of her head. Endorses some photophobia and blurred vision of both eyes. Does have a history of chronic migraines.  Exam: Vitals:   08/28/18 0704 08/28/18 0735  BP:  105/67  Pulse:  75  Resp: 16 16  Temp:  98.1 F (36.7 C)  SpO2:  100%    Physical Exam   HEENT-  Normocephalic, no lesions, without obvious abnormality.  Normal external eye and conjunctiva.   Cardiovascular- S1-S2 audible, pulses palpable throughout   Lungs-no rhonchi or wheezing noted, no excessive working breathing.  Saturations within normal limits on RA Abdomen- All 4 quadrants palpated and nontender Extremities- Warm, dry and intact Musculoskeletal-no joint tenderness, deformity or swelling Skin-warm and dry, no hyperpigmentation, vitiligo, or suspicious lesions   Neuro:  Mental Status: Alert, oriented,to person/place/month/year/city/state. Naming intact. thought content appropriate.  Speech fluent without evidence of aphasia.  Able to follow  commands without difficulty. Cranial Nerves: II:  Visual fields grossly normal,  III,IV, VI: ptosis not present, no nystagmus, extra-ocular motions intact bilaterally pupils equal, round, reactive to light and accommodation V,VII: smile symmetric, facial light touch sensation normal bilaterally VIII: hearing normal bilaterally IX,X: uvula rises symmetrically XI: bilateral shoulder shrug XII: midline tongue extension Motor: Right : Upper extremity   5/5  Left:     Upper extremity   5/5  Lower extremity   5/5   Lower extremity   5/5 Tone and bulk:normal tone throughout; no atrophy noted Sensory: light touch intact throughout, bilaterally Deep Tendon Reflexes: 2+ and symmetric biceps, patella Plantars: Right: downgoing   Left:  downgoing Cerebellar: normal finger-to-nose,HKS not tested d/t shackles Gait: deferred    Medications:  Scheduled: . clindamycin  150 mg Oral Q6H  . levETIRAcetam  1,000 mg Oral BID  . potassium chloride  20 mEq Oral BID  . prenatal multivitamin  1 tablet Oral Q1200   Continuous: . sodium chloride 75 mL/hr at 08/28/18 0656   [id number]  Pertinent Labs/Diagnostics:   Assessment:  22 year-old pregnant female with history of seizures and possible nonepileptic events presents to the emergency room in the setting of multiple seizures after being off Keppra for about 1 week.  Impression: Seizures, breakthrough in the setting of missed medication   Recommendations:  --Continue keppra 1g BID --Seizure precautions -- consider LTM EEG if patient has more events while in the hospital   Valentina Lucks, MSN, NP-C [facility] [phone] 08/28/2018, 8:47 AM  Attending neurologist's note to follow  I have seen the patient and reviewed the above note.  She is back to baseline following her episodes.  She has a headache which is typical of her migraines.  It is not relieved with acetaminophen and therefore I will try Reglan.  Metoclopramide 10 mg IV x1  No further recommendations at this time, please call with further questions or concerns.  Ritta Slot, MD [facility] [phone]  If 7pm- 7am, please page neurology on call as listed in AMION.

## 2018-08-28 NOTE — ED Notes (Signed)
Deputy sheriff still at the bedside

## 2018-08-28 NOTE — Progress Notes (Signed)
Patient receive from ed via streacher accompany by sheriff. Patient report being in jail for 10 days and is scheduled to do 30. Patient Alert and oriented and 7 months pregnant. She follow [facility] as outpatient OB GYN. Skin intact and patient placed on SD monitor which has been verified. seizure precaution initiated.

## 2018-08-29 ENCOUNTER — Inpatient Hospital Stay (HOSPITAL_COMMUNITY): Payer: Medicaid Other

## 2018-08-29 DIAGNOSIS — R569 Unspecified convulsions: Secondary | ICD-10-CM

## 2018-08-29 DIAGNOSIS — Z3493 Encounter for supervision of normal pregnancy, unspecified, third trimester: Secondary | ICD-10-CM

## 2018-08-29 DIAGNOSIS — N3000 Acute cystitis without hematuria: Secondary | ICD-10-CM

## 2018-08-29 LAB — URINE CULTURE

## 2018-08-29 LAB — POTASSIUM: Potassium: 3.8 mmol/L (ref 3.5–5.1)

## 2018-08-29 MED ORDER — LEVETIRACETAM 500 MG PO TABS: 1000.0000 mg | ORAL_TABLET | Freq: Two times a day (BID) | ORAL | 0 refills | Status: DC

## 2018-08-29 MED ORDER — CLINDAMYCIN HCL 150 MG PO CAPS: 150.0000 mg | ORAL_CAPSULE | Freq: Four times a day (QID) | ORAL | 0 refills | Status: AC

## 2018-08-29 MED ORDER — MUPIROCIN 2 % EX OINT: 1.0000 "application " | TOPICAL_OINTMENT | Freq: Two times a day (BID) | CUTANEOUS | 0 refills | Status: AC

## 2018-08-29 MED ORDER — METOCLOPRAMIDE HCL 10 MG PO TABS: 10.0000 mg | ORAL_TABLET | ORAL | Status: DC | PRN

## 2018-08-29 NOTE — Discharge Summary (Signed)
Family Medicine Teaching [facility] AMA Summary  Patient name: Erin Krueger Medical record number: [id number] Date of birth: 11-29-1995 Age: 22 y.o. Gender: female Date of Admission: 08/27/2018  Date of AMA Leave: 08/29/2018 Admitting Physician: Carney Living, MD  Primary Care Provider: Tilda Burrow, MD Consultants: Neurology  Indication for Hospitalization: Seizures  AMA Diagnoses/Problem List:  Seizure disorder Third Trimester pregnancy Asymptomatic bacteriuria   Disposition: home, AMA  AMA Condition: Stable  AMA Exam:  Physical Exam:  Gen: NAD, alert, non-toxic, well-appearing Skin: Warm and dry. No obvious rashes, lesions, or trauma. HEENT: NCAT, PERRLA, EOMI, No conjunctival pallor or injection. No scleral icterus or injection.  MMM.  CV: RRR. <2s capillary refill bilaterally.  RP & DPs 2+ bilaterally. No bilateral LE edema Resp: CTAB.  No wheezing, rales, abnormal lung sounds.  No increased WOB Abd: NTND on palpation to all 4 quadrants.  Positive bowel sounds. Psych: Cooperative with exam. Pleasant. Makes eye contact. Speech normal. Extremities: Moves all extremities spontaneously  Neuro: CN II-XII grossly intact. No FNDs.    Brief Hospital Course:  Erin Krueger is a 22 yo female G3P1010 at [redacted]w[redacted]d with a past medical history for seizures, PTSD, ODD, ADHD, asthma who presented from [facility] jail with multiple seizure episodes in the setting of medication non adherence.   Initial work up was unremarkable. Patient was loaded with Keppra and observed in the ED. Patient had another witnessed generalized tonic clonic seizure that self resolved and patient was admitted. Neurology was consulted and followed the patient throughout. Patient was started on her home Keppra regimen and monitored closely. She was evaluated by OB/GYN from [facility] and was medically cleared by them. She remained stable until the early morning of 10/7 when she experienced  several episodes of seizure like activity that self resolved. Due to this, patient had a 24-hour EEG performed which was normal with no epileptiform discharged or lateralizing signs.  During her admission, patient was also noted to have asymptomatic bacteriuria. Patient was started on Clindamycin 150mg  and was instructed to complete a full 5 day course.   On 10/7 patient was released from [facility] jail while inpatient and requested to leave AMA and stated that her husband was only able to pick her up that night.  At the time she was deemed to have mental capacity to make this decision.  She was made aware of the risks of leaving AMA, including continued seizures and death, and still accepted those risks and wanted to leave.  Prior to discharge, fetal heart tones were measured by doppler and noted to be 132 bpm.  She was given return precautions for her pregnancy that were listed in her AMA discharge instructions.  Issues for Follow Up:  1. Recommend hospital follow-up with neurology or PCP concerning seizure treatment and medication adherence  Significant Procedures: 24-hour EEG  Significant Labs and Imaging:  Recent Labs  Lab 08/23/18 2304 08/27/18 2223 08/28/18 0601  WBC 13.1* 14.1* 13.5*  HGB 11.4* 10.9* 11.0*  HCT 34.0* 33.5* 33.7*  PLT 188 181 169   Recent Labs  Lab 08/23/18 2304 08/27/18 2223 08/28/18 0601 08/29/18 0548  NA 137 135 136  --   K 3.7 3.5 3.4* 3.8  CL 107 107 107  --   CO2 22 20* 20*  --   GLUCOSE 90 80 86  --   BUN 9 5* <5*  --   CREATININE 0.44 0.49 0.46  --   CALCIUM 9.0 8.8* 8.6*  --  ALKPHOS  --  56  --   --   AST  --  14*  --   --   ALT  --  17  --   --   ALBUMIN  --  2.7*  --   --    Results/Tests Pending at Time of Discharge: None  AMA Medications:  Allergies as of 08/29/2018      Reactions   Bee Venom Anaphylaxis   Cashew Nut Oil Anaphylaxis   Penicillins Anaphylaxis   Has patient had a PCN reaction causing immediate rash,  facial/tongue/throat swelling, SOB or lightheadedness with hypotension: Unknown Has patient had a PCN reaction causing severe rash involving mucus membranes or skin necrosis: Unknown Has patient had a PCN reaction that required hospitalization: Unknown Has patient had a PCN reaction occurring within the last 10 years: Unknown If all of the above answers are "NO", then may proceed with Cephalosporin use.   Prednisone Other (See Comments)   Bleeding nose bleeding and internal bleeding   Cocoa Butter Rash   Latex Rash   Shellfish Allergy Hives, Rash      Medication List    TAKE these medications   acetaminophen 500 MG tablet Commonly known as:  TYLENOL Take 500 mg by mouth every 6 (six) hours as needed for mild pain, moderate pain, fever or headache.   albuterol 108 (90 Base) MCG/ACT inhaler Commonly known as:  PROVENTIL HFA;VENTOLIN HFA Inhale 2 puffs into the lungs every 6 (six) hours as needed for wheezing or shortness of breath.   clindamycin 150 MG capsule Commonly known as:  CLEOCIN Take 1 capsule (150 mg total) by mouth every 6 (six) hours for 3 days.   EPINEPHrine 0.3 mg/0.3 mL Soaj injection Commonly known as:  EPI-PEN Inject 0.3 mLs (0.3 mg total) into the muscle as needed.   levETIRAcetam 500 MG tablet Commonly known as:  KEPPRA Take 2 tablets (1,000 mg total) by mouth 2 (two) times daily.   mupirocin ointment 2 % Commonly known as:  BACTROBAN Place 1 application into the nose 2 (two) times daily for 4 days.   prenatal multivitamin Tabs tablet Take 1 tablet by mouth daily at 12 noon.   ranitidine 150 MG tablet Commonly known as:  ZANTAC Take 1 tablet (150 mg total) by mouth 2 (two) times daily.       AMA Instructions: Please refer to Patient Instructions section of EMR for full details.  Patient was counseled important signs and symptoms that should prompt return to medical care, changes in medications, dietary instructions, activity restrictions, and follow  up appointments.   Follow-Up Appointments: Follow-up Information    Tilda Burrow, MD Follow up.   Specialties:  Obstetrics and Gynecology, Radiology Contact information: [address] [place] [address] [phone]          Joana Reamer, DO 08/30/2018, 3:11 PM PGY-1, [facility]

## 2018-08-29 NOTE — Progress Notes (Addendum)
Subjective: Recall for possible multiple seizure activity overnight. No clear description of the seizure activity available. Patient is just received Ativan and is extremely lethargic and difficult to wake up at this point in time.  She is unable to provide clear history.  Exam: Vitals:   08/29/18 0020 08/29/18 0417  BP: 105/67   Pulse: 75   Resp: (!) 22   Temp: 98.3 F (36.8 C) 98.6 F (37 C)  SpO2: 99%    Physical Exam  HEENT-  Normocephalic, no lesions, without obvious abnormality.  Normal external eye and conjunctiva.   Cardiovascular- S1-S2 audible, pulses palpable throughout   Lungs-no rhonchi or wheezing noted, no excessive working breathing.  Saturations within normal limits Abdomen- All 4 quadrants palpated and nontender Extremities- Warm, dry and intact Musculoskeletal-no joint tenderness, deformity or swelling Skin-warm and dry, no hyperpigmentation, vitiligo, or suspicious lesions Neuro:  Mental Status: Patient is lethargic will not wake up long enough to do an exam and keeps falling asleep. Speech is mildly dysarthric.  Poor attention concentration. Cranial Nerves: II: Blinks to threat VII: Face symmetric VIII: hearing normal bilaterally XI: bilateral shoulder shrug normal XII: midline tongue extension Motor: Moving all extremities antigravity Sensory: Pinprick and light touch intact throughout, bilaterally Plantars: Right: downgoing   Left: downgoing  Medications:  Scheduled: . Chlorhexidine Gluconate Cloth  6 each Topical Q0600  . clindamycin  150 mg Oral Q6H  . levETIRAcetam  1,000 mg Oral BID  . mupirocin ointment  1 application Nasal BID  . prenatal multivitamin  1 tablet Oral Q1200   Continuous: . sodium chloride 75 mL/hr at 08/28/18 2329   ZOX:[id number]  Pertinent Labs/Diagnostics: -Urinalysis with large leukocyte esterase, negative nitrite, greater than 50 WBCs and rare bacteria. 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 She has a history of epileptic and nonepileptic seizures.  Prior records are scanty at this time.  She is also an inmate and has considerable history of migraines, headaches, anxiety, PTSD which complicates the clinical picture.  At this time, long-term EEG might help characterize her episodes better.   Recommendations: -Continue Keppra at 1 g twice daily - Seizure precautions - LTM EEG to be hooked up today-we will continue to follow with you  -Per [facility] statutes, patients with seizures are not allowed to drive until  they have been seizure-free for six months. Use caution when using heavy equipment or power tools. Avoid working on ladders or at heights. Take showers instead of baths. Ensure the water temperature is not too high on the home water heater. Do not go swimming alone. When caring for infants or small children, sit down when holding, feeding, or changing them to minimize risk of injury to the child in the event you have a seizure.   Also, Maintain good sleep hygiene. Avoid alcohol.  08/29/2018, 10:21 AM   Attending Neurohospitalist Addendum Patient seen and examined with APP/Resident. Agree with the history and physical as documented above. Agree with the plan as documented, which I helped formulate. I have independently reviewed the chart, obtained history, review of systems and examined the patient.I have personally reviewed pertinent head/neck/spine imaging (CT/MRI). Please feel free to call with any questions. --- Milon Dikes, MD [facility] Pager: [phone]  If 7pm to 7am, please call on  call as listed on AMION.

## 2018-08-29 NOTE — Discharge Instructions (Signed)
You are leaving against medical advice.  We do not recommend that you leave at this time, as your workup has not been completed.  The risks of leaving have been discussed with you and you have been shown to have the mental capacity to make the decision to leave.  Please continue to take your keppra twice a day and follow up with neurology.  Go to ED if not feeling baby move, big gush of fluid, lots of bleeding, severe belly pain, contractions 7-8 minutes apart.

## 2018-08-29 NOTE — Progress Notes (Signed)
LTM EEG set up, running, push button tested, no initial skin breakdown.

## 2018-08-29 NOTE — Progress Notes (Signed)
Family Medicine Teaching Service Daily Progress Note Intern Pager: [phone]  Patient name: Erin Krueger Medical record number: [id number] Date of birth: September 06, 1996 Age: 22 y.o. Gender: female  Primary Care Provider: Tilda Burrow, MD Consultants: Neurology Code Status: Full code  Pt Overview and Major Events to Date:  08/27/2018 Admitted 08/29/2018 EEG Hospital Day: 3   Assessment and Plan: Erin Krueger is a 22 y.o. female G3P1010 at [redacted]w[redacted]d with as past medical history significant seizures, PTSD, ODD, ADHD, asthma who presented to [facility] jail with multiple seizure episodes in the setting of medication non adherence.   #Seizures, chronic, uncontrolled Migraine like headache yesterday, relieved with 1 dose of Reglan. Patient complains of similar headache today. She notes a history of migraines that were relieved with Excedrin prior to pregnancy. Overnight patient had three episodes of seizure like activity involving partial body shaking that lasted about 90 seconds each with one minute between. Patient noted urinary incontinence during episode, but no tongue lacerations. Exam after the episodes was significant for impaired orientation to time and some reported LE weakness. During episode and thereafter, patient's vitals have been stable and she has been afebrile. Due to repeat seizure events, 24-hour EEG ordered per neurology recs.  - Continue Keppra 1g BID - PRN ativan if status epilepticus - Follow-up 24-hour EEG  - Continue Seizure precautions - Monitor vitals - Reglan 10mg  PO PRN headache  #Third trimester pregnancy Patient was evaluated by OB in the ED, workup and fetal monitoring returned normal, patient was cleared. This morning patient reports fetal movement and denies any contraction or leaking of fluid or blood. Will consult OB for evaluation if patient voices any concerns. - Continue prenatal vitamins - Zantac 150mg  BID PRN   Hypokalemia:  Patient  noted to have mild hypokalemia of K+ 3.4. Repleted 40 mEq of K+ on 10/6. Repeat K+ this morning 3.8.  - Will continue to monitor - Replace as needed  #Asymptomatic bacteriuria  Patient currently has UTI with large leuks on UA, culture pending. WBC 14.1 > 13.5 (10/6). Patient has history of ESBL positive UTI treated with Macrobid. She was supposed to be on macrobid suppressive therapy, however nonadherent due to incarceration. Macrobid initiated but transitioned by pharmacy to Clindamycin due to pregnancy.  - Continue Clindamycin 150 mg q6hr x 5 days (10/6-) - Follow-up urine cultures - Multiple species present/suggest recollection - Consider suppressive therapy going forward   #Asthma, chronic, well controlled Patient reports that she has a history of asthma for which she takes Qvar on a daily basis.  Patient has been unable to use Qvar since her incarceration.  Patient has remained stable on RA without signs of wheezing or increased WOB on exam. - Continue Qvar 2 puff BID - Monitor respiratory status - Oxygen therapy as needed  #Protein calorie malnutrition Patient Albumin on admission is 2.7.  Suspect poor calorie intake given current incarcerated status.  Consider nutrition consult prior to discharge.  Fluids:  . sodium chloride 75 mL/hr at 08/29/18 1328  Electrolytes: Replace as needed Nutrition: Regular diet GI ppx: None DVT ppx: SCDs - no anticoagulation given recent episodes of GI bleeding  Future labs: None Disposition: [facility]   Medications: Scheduled Meds: . Chlorhexidine Gluconate Cloth  6 each Topical Q0600  . clindamycin  150 mg Oral Q6H  . levETIRAcetam  1,000 mg Oral BID  . mupirocin ointment  1 application Nasal BID  . prenatal multivitamin  1 tablet Oral Q1200   Continuous  Infusions: . sodium chloride 75 mL/hr at 08/29/18 1328   PRN Meds: acetaminophen,  metoCLOPramide  ================================================= ================================================= Subjective:  Patient reports a rough night. She feels very tired today. Complains of a left sided headache located behind her left eye that is worse with light. She notes the Reglan yesterday really helped. She notes she has a history of migraines and usually takes Excedrin. Pt has had good appetite and normal voids and bowel movements. She notes good fetal movement, no leaking of blood or fluid, and no contractions.   Objective: Vital Signs Temp:  [98.1 F (36.7 C)-98.7 F (37.1 C)] 98.6 F (37 C) (10/07 1717) Pulse Rate:  [75-84] 76 (10/07 1101) Resp:  [14-22] 16 (10/07 1717) BP: (96-113)/(62-70) 108/65 (10/07 1717) SpO2:  [96 %-99 %] 98 % (10/07 1101)  Intake/Output 10/06 0701 - 10/07 0700 In: 677.6 [I.V.:677.6] Out: -   Physical Exam:  Gen: NAD, alert, non-toxic, well-appearing, sleepy, lying comfortably in bed Skin: Warm and dry. No obvious rashes, lesions, or trauma. HEENT: NCAT, PERRLA, EOMI, No conjunctival pallor or injection. No scleral icterus or injection.  MMM.  CV: RRR. <2s capillary refill bilaterally.  RP & DPs 2+ bilaterally. No bilateral LE edema Resp: CTAB.  No wheezing, rales, abnormal lung sounds.  No increased WOB Abd: NTND on palpation to all 4 quadrants.  Positive bowel sounds. Psych: Cooperative with exam. Pleasant. Makes eye contact. Speech normal. Extremities: Moves all extremities spontaneously  Neuro: CN II-XII grossly intact. No FNDs.    Laboratory: Recent Labs  Lab 08/23/18 2304 08/27/18 2223 08/28/18 0601  WBC 13.1* 14.1* 13.5*  HGB 11.4* 10.9* 11.0*  HCT 34.0* 33.5* 33.7*  PLT 188 181 169   Recent Labs  Lab 08/23/18 2304 08/27/18 2223 08/28/18 0601 08/29/18 0548  NA 137 135 136  --   K 3.7 3.5 3.4* 3.8  CL 107 107 107  --   CO2 22 20* 20*  --   BUN 9 5* <5*  --   CREATININE 0.44 0.49 0.46  --   CALCIUM 9.0 8.8*  8.6*  --   PROT  --  5.5*  --   --   BILITOT  --  0.4  --   --   ALKPHOS  --  56  --   --   ALT  --  17  --   --   AST  --  14*  --   --   GLUCOSE 90 80 86  --    Imaging/Diagnostic Tests: No results found.  Joana Reamer, DO 08/29/2018, 5:37 PM PGY-1, [facility] Family Medicine FPTS Intern pager: [phone], text pages welcome

## 2018-08-29 NOTE — Progress Notes (Signed)
Patient left AMA, MD aware and to the bedside. Patient educated on the risk of signing out AMA. Form provided for patient to sign. Pt left  with husband.

## 2018-08-30 NOTE — Procedures (Signed)
LTM-EEG Report  HISTORY: Continuous video-EEG monitoring performed for 22 year old with possible seizures.  ACQUISITION: International 10-20 system for electrode placement; 18 channels with additional eyes linked to ipsilateral ears and EKG. Additional T1-T2 electrodes were used. Continuous video recording obtained.   EEG NUMBER:  MEDICATIONS:  Day 1: see EMR  DAY #1: from 1056 08/29/18 to 2207 08/29/18 BACKGROUND: An overall medium voltage continuous recording with good spontaneous variability and reactivity. Waking background consisted of a medium voltage 9 Hz posterior dominant rhythm bilaterally with low voltage beta activity in the bilateral frontocentral regions and some medium voltage theta activity diffusely. Sleep was captured with normal stage II sleep architecture.   EPILEPTIFORM/PERIODIC ACTIVITY: none  SEIZURES: none EVENTS: none  EKG: no significant arrhythmia  SUMMARY: This was a normal continuous video EEG with no epileptiform discharges or lateralizing signs.

## 2018-09-12 ENCOUNTER — Encounter: Payer: Medicaid Other | Admitting: Women's Health

## 2018-09-12 ENCOUNTER — Other Ambulatory Visit: Payer: Medicaid Other

## 2018-09-26 ENCOUNTER — Telehealth: Payer: Self-pay | Admitting: Emergency Medicine

## 2018-09-26 NOTE — Telephone Encounter (Signed)
Lost to followup 

## 2018-09-28 ENCOUNTER — Ambulatory Visit (INDEPENDENT_AMBULATORY_CARE_PROVIDER_SITE_OTHER): Payer: Medicaid Other

## 2018-09-28 ENCOUNTER — Ambulatory Visit (INDEPENDENT_AMBULATORY_CARE_PROVIDER_SITE_OTHER): Payer: Medicaid Other | Admitting: Advanced Practice Midwife

## 2018-09-28 VITALS — BP 119/78 | HR 72 | Wt 180.0 lb

## 2018-09-28 DIAGNOSIS — Z8279 Family history of other congenital malformations, deformations and chromosomal abnormalities: Secondary | ICD-10-CM | POA: Diagnosis not present

## 2018-09-28 DIAGNOSIS — Z3483 Encounter for supervision of other normal pregnancy, third trimester: Secondary | ICD-10-CM | POA: Diagnosis not present

## 2018-09-28 DIAGNOSIS — R569 Unspecified convulsions: Secondary | ICD-10-CM

## 2018-09-28 DIAGNOSIS — Z3A35 35 weeks gestation of pregnancy: Secondary | ICD-10-CM | POA: Diagnosis not present

## 2018-09-28 DIAGNOSIS — O26843 Uterine size-date discrepancy, third trimester: Secondary | ICD-10-CM

## 2018-09-28 DIAGNOSIS — Z331 Pregnant state, incidental: Secondary | ICD-10-CM

## 2018-09-28 DIAGNOSIS — Z1389 Encounter for screening for other disorder: Secondary | ICD-10-CM

## 2018-09-28 LAB — POCT URINALYSIS DIPSTICK OB
Blood, UA: NEGATIVE
Glucose, UA: NEGATIVE
Ketones, UA: NEGATIVE
Leukocytes, UA: NEGATIVE
Nitrite, UA: NEGATIVE
POC,PROTEIN,UA: NEGATIVE

## 2018-09-28 NOTE — Progress Notes (Signed)
  G3P1010 [redacted]w[redacted]d Estimated Date of Delivery: 11/02/18  Blood pressure 119/78, pulse 72, weight 180 lb (81.6 kg), last menstrual period 01/26/2018, unknown if currently breastfeeding.   BP weight and urine results all reviewed and noted.  Please refer to the obstetrical flow sheet for the fundal height and fetal heart rate documentation:  Patient reports good fetal movement, denies any bleeding and no rupture of membranes symptoms or regular contractions.  Since last visit, pt went to jail and was off Keppra for 10 days. She had several witnessed tonic/clonic seizures and was admitted.  Now back on Keppra, out of jail, and hasnt had any more seizures.   Also went to [facility] twice while in jail for c/o "soaking 2 pads/hour" of blood with no blood in vagina upon exam, [place] normal   Never got fetal echo  All questions were answered.   Physical Assessment:   Vitals:   09/28/18 1552  BP: 119/78  Pulse: 72  Weight: 180 lb (81.6 kg)  Body mass index is 28.19 kg/m.        Physical Examination:   General appearance: Well appearing, and in no distress  Mental status: Alert, oriented to person, place, and time  Skin: Warm & dry  Cardiovascular: Normal heart rate noted  Respiratory: Normal respiratory effort, no distress  Abdomen: Soft, gravid, nontender  Pelvic: Cervical exam deferred         Extremities: Edema: Traceneg [place] 35 wks,cephalic,anterior pl gr 3,normal ovaries bilat,afi 14 cm,LVEICF,EFW 2456 g 34%,fhr 137 bpm Fetal Status:     Movement: Present    Results for orders placed or performed in visit on 09/28/18 (from the past 24 hour(s))  POC Urinalysis Dipstick OB   Collection Time: 09/28/18  3:55 PM  Result Value Ref Range   Color, UA     Clarity, UA     Glucose, UA Negative Negative   Bilirubin, UA     Ketones, UA neg    Spec Grav, UA     Blood, UA neg    pH, UA     POC,PROTEIN,UA Negative Negative, Trace   Urobilinogen, UA     Nitrite, UA neg    Leukocytes, UA Negative  Negative   Appearance     Odor       Orders Placed This Encounter  Procedures  . POC Urinalysis Dipstick OB    Plan:  Continued routine obstetrical care,   Return in about 1 week (around 10/05/2018) for LROB.

## 2018-09-28 NOTE — Patient Instructions (Addendum)
[phone] for fetal echo    AM I IN LABOR? What is labor? Labor is the work that your body does to birth your baby. Your uterus (the womb) contracts. Your cervix (the mouth of the uterus) opens. You will push your baby out into the world.  What do contractions (labor pains) feel like? When they first start, contractions usually feel like cramps during your period. Sometimes you feel pain in your back. Most often, contractions feel like muscles pulling painfully in your lower belly. At first, the contractions will probably be 15 to 20 minutes apart. They will not feel too painful. As labor goes on, the contractions get stronger, closer together, and more painful.  How do I time the contractions? Time your contractions by counting the number of minutes from the start of one contraction to the start of the next contraction.  What should I do when the contractions start? If it is night and you can sleep, sleep. If it happens during the day, here are some things you can do to take care of yourself at home: ? Walk. If the pains you are having are real labor, walking will make the contractions come faster and harder. If the contractions are not going to continue and be real labor, walking will make the contractions slow down. ? Take a shower or bath. This will help you relax. ? Eat. Labor is a big event. It takes a lot of energy. ? Drink water. Not drinking enough water can cause false labor (contractions that hurt but do not open your cervix). If this is true labor, drinking water will help you have strength to get through your labor. ? Take a nap. Get all the rest you can. ? Get a massage. If your labor is in your back, a strong massage on your lower back may feel very good. Getting a foot massage is always good. ? Don't panic. You can do this. Your body was made for this. You are strong!  When should I go to the hospital or call my health care provider? ? Your contractions have been 5 minutes  apart or less for at least 1 hour. ? If several contractions are so painful you cannot walk or talk during one. ? Your bag of waters breaks. (You may have a big gush of water or just water that runs down your legs when you walk.)  Are there other reasons to call my health care provider? Yes, you should call your health care provider or go to the hospital if you start to bleed like you are having a period- blood that soaks your underwear or runs down your legs, if you have sudden severe pain, if your baby has not moved for several hours, or if you are leaking green fluid. The rule is as follows: If you are very concerned about something, call.

## 2018-09-28 NOTE — Progress Notes (Signed)
[place] 35 wks,cephalic,anterior pl gr 3,normal ovaries bilat,afi 14 cm,LVEICF,EFW 2456 g 34%,fhr 137 bpm

## 2018-10-05 ENCOUNTER — Encounter: Payer: Medicaid Other | Admitting: Obstetrics and Gynecology

## 2018-10-05 ENCOUNTER — Telehealth: Payer: Self-pay | Admitting: *Deleted

## 2018-10-05 NOTE — Telephone Encounter (Signed)
Patient states she is having sharp shooting pain in her back.  Can only get relief with a warm bath.  No other problems.  Missed her appt today as she thought it was tomorrow. Moved patient's appt up to tomorrow but [facility] became worse[facility]  Verbalized understanding.

## 2018-10-06 ENCOUNTER — Encounter: Payer: Medicaid Other | Admitting: Family Medicine

## 2018-10-12 ENCOUNTER — Encounter: Payer: Medicaid Other | Admitting: Obstetrics and Gynecology

## 2018-10-13 ENCOUNTER — Encounter: Payer: Medicaid Other | Admitting: Obstetrics and Gynecology

## 2018-11-01 ENCOUNTER — Encounter (HOSPITAL_COMMUNITY): Payer: Self-pay | Admitting: Emergency Medicine

## 2018-11-01 ENCOUNTER — Emergency Department (HOSPITAL_COMMUNITY)
Admission: EM | Admit: 2018-11-01 | Discharge: 2018-11-01 | Disposition: A | Discharge status: Home / Self Care | Payer: Medicaid Other | Attending: Emergency Medicine | Admitting: Emergency Medicine

## 2018-11-01 DIAGNOSIS — F1290 Cannabis use, unspecified, uncomplicated: Secondary | ICD-10-CM | POA: Diagnosis not present

## 2018-11-01 DIAGNOSIS — R21 Rash and other nonspecific skin eruption: Secondary | ICD-10-CM | POA: Diagnosis present

## 2018-11-01 DIAGNOSIS — F419 Anxiety disorder, unspecified: Secondary | ICD-10-CM | POA: Diagnosis not present

## 2018-11-01 DIAGNOSIS — J45909 Unspecified asthma, uncomplicated: Secondary | ICD-10-CM | POA: Diagnosis not present

## 2018-11-01 DIAGNOSIS — Z79899 Other long term (current) drug therapy: Secondary | ICD-10-CM | POA: Diagnosis not present

## 2018-11-01 DIAGNOSIS — Z91040 Latex allergy status: Secondary | ICD-10-CM | POA: Insufficient documentation

## 2018-11-01 DIAGNOSIS — Z87891 Personal history of nicotine dependence: Secondary | ICD-10-CM | POA: Insufficient documentation

## 2018-11-01 DIAGNOSIS — F902 Attention-deficit hyperactivity disorder, combined type: Secondary | ICD-10-CM | POA: Diagnosis not present

## 2018-11-01 DIAGNOSIS — F913 Oppositional defiant disorder: Secondary | ICD-10-CM | POA: Diagnosis not present

## 2018-11-01 MED ORDER — HYDROCORTISONE 1 % EX CREA: TOPICAL_CREAM | CUTANEOUS | 0 refills | Status: DC

## 2018-11-01 NOTE — ED Triage Notes (Signed)
Pt c/o rash to bilateral arms, legs that started a few days ago, denies any new exposures,

## 2018-11-01 NOTE — Discharge Instructions (Addendum)
You may continue the Benadryl as directed for itching.  Apply a small amount of hydrocortisone cream to the affected areas only if needed.  Follow-up with your OB on Thursday.

## 2018-11-02 ENCOUNTER — Inpatient Hospital Stay (HOSPITAL_COMMUNITY)
Admission: EM | Admit: 2018-11-02 | Discharge: 2018-11-03 | Disposition: A | Discharge status: Home / Self Care | Payer: Medicaid Other | Attending: Obstetrics and Gynecology | Admitting: Obstetrics and Gynecology

## 2018-11-02 ENCOUNTER — Other Ambulatory Visit: Payer: Self-pay

## 2018-11-02 ENCOUNTER — Encounter (HOSPITAL_COMMUNITY): Payer: Self-pay | Admitting: Emergency Medicine

## 2018-11-02 DIAGNOSIS — R109 Unspecified abdominal pain: Secondary | ICD-10-CM | POA: Insufficient documentation

## 2018-11-02 DIAGNOSIS — N390 Urinary tract infection, site not specified: Secondary | ICD-10-CM

## 2018-11-02 DIAGNOSIS — O26893 Other specified pregnancy related conditions, third trimester: Secondary | ICD-10-CM | POA: Diagnosis not present

## 2018-11-02 DIAGNOSIS — W010XXA Fall on same level from slipping, tripping and stumbling without subsequent striking against object, initial encounter: Secondary | ICD-10-CM | POA: Insufficient documentation

## 2018-11-02 DIAGNOSIS — Z880 Allergy status to penicillin: Secondary | ICD-10-CM | POA: Diagnosis not present

## 2018-11-02 DIAGNOSIS — Y92009 Unspecified place in unspecified non-institutional (private) residence as the place of occurrence of the external cause: Secondary | ICD-10-CM | POA: Insufficient documentation

## 2018-11-02 DIAGNOSIS — G40909 Epilepsy, unspecified, not intractable, without status epilepticus: Secondary | ICD-10-CM

## 2018-11-02 DIAGNOSIS — O133 Gestational [pregnancy-induced] hypertension without significant proteinuria, third trimester: Secondary | ICD-10-CM

## 2018-11-02 DIAGNOSIS — W19XXXA Unspecified fall, initial encounter: Secondary | ICD-10-CM

## 2018-11-02 DIAGNOSIS — Z87891 Personal history of nicotine dependence: Secondary | ICD-10-CM | POA: Insufficient documentation

## 2018-11-02 DIAGNOSIS — O99353 Diseases of the nervous system complicating pregnancy, third trimester: Secondary | ICD-10-CM | POA: Diagnosis not present

## 2018-11-02 DIAGNOSIS — Z3A40 40 weeks gestation of pregnancy: Secondary | ICD-10-CM | POA: Diagnosis not present

## 2018-11-02 DIAGNOSIS — Z3483 Encounter for supervision of other normal pregnancy, third trimester: Secondary | ICD-10-CM

## 2018-11-02 MED ORDER — ACETAMINOPHEN 325 MG PO TABS
650.0000 mg | ORAL_TABLET | Freq: Once | ORAL | Status: AC
  Administered 2018-11-02: 650 mg via ORAL

## 2018-11-02 MED ORDER — ACETAMINOPHEN 325 MG PO TABS
ORAL_TABLET | ORAL | Status: AC
  Filled 2018-11-02: qty 2

## 2018-11-02 MED ORDER — SODIUM CHLORIDE 0.9 % IV BOLUS
1000.0000 mL | Freq: Once | INTRAVENOUS | Status: AC
  Administered 2018-11-02: 1000 mL via INTRAVENOUS

## 2018-11-02 NOTE — ED Provider Notes (Signed)
[facility] EMERGENCY DEPARTMENT Provider Note   CSN: [id number] Arrival date & time: 11/01/18  1153     History   Chief Complaint Chief Complaint  Patient presents with  . Rash    HPI Erin Krueger is a 22 y.o. female.  HPI   Erin Krueger is a 22 y.o. female [id number] who is [redacted] weeks pregnant with EDD of 11/02/18, presents to the Emergency Department complaining of itching and rash to bilateral arms, back and both legs.  Symptoms present for several days.  Has been taking benadryl with improvement of the itching, but rash not improving.  She denies fever, chills, pain, swelling, new medications, soaps or detergents.  Has an appt with her OB on Thursday.  Also denies complications with the pregnancy, abdominal pain, vaginal bleeding.  Endorses regular fetal movement.    Past Medical History:  Diagnosis Date  . ADHD (attention deficit hyperactivity disorder)   . Allergy   . Anxiety   . Asthma   . Complication of anesthesia   . Eating disorder   . Headache(784.0)   . Kidney stone   . Migraines   . ODD (oppositional defiant disorder)   . PTSD (post-traumatic stress disorder)   . Seizures [facility](HCC)    diagnosed age 89    Patient Active Problem List   Diagnosis Date Noted  . Seizures (HCC) 08/28/2018  . Third trimester pregnancy   . Persistent UTI (urinary tract infection) during pregnancy 04/08/2018  . Marijuana use 04/07/2018  . Supervision of normal pregnancy 04/05/2018  . Smoker 04/05/2018  . Death of child 04/05/2018  . 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 PID (acute pelvic inflammatory disease) 01/29/2016  . Seizure disorder (HCC) 01/07/2016  . Seizure (HCC) 01/07/2016  . 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 PTSD (post-traumatic stress disorder) 05/12/2013  . 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 Polysubstance abuse (HCC) 05/12/2013    Past Surgical History:  Procedure Laterality Date  . right knee surgery      . TONSILLECTOMY AND ADENOIDECTOMY       OB History    Gravida  3   Para  1   Term  1   Preterm      AB  1   Living  0     SAB  1   TAB      Ectopic      Multiple      Live Births  1            Home Medications    Prior to Admission medications   Medication Sig Start Date End Date Taking? Authorizing Provider  acetaminophen (TYLENOL) 500 MG tablet Take 500 mg by mouth every 6 (six) hours as needed for mild pain, moderate pain, fever or headache.     [provider]  albuterol (PROVENTIL HFA;VENTOLIN HFA) 108 (90 Base) MCG/ACT inhaler Inhale 2 puffs into the lungs every 6 (six) hours as needed for wheezing or shortness of breath. 04/05/18   Cheral MarkerBooker, Kimberly R, CNM  EPINEPHrine 0.3 mg/0.3 mL IJ SOAJ injection Inject 0.3 mLs (0.3 mg total) into the muscle as needed. 06/27/18   Cheral MarkerBooker, Kimberly R, CNM  folic acid (FOLVITE) 1 MG tablet Take 1 mg by mouth daily.    [provider]  hydrocortisone cream 1 % Apply to affected area 2 times daily 11/01/18   Prathik Aman, PA-C  levETIRAcetam (KEPPRA) 500 MG tablet Take 2 tablets (1,000 mg total) by mouth 2 (two) times daily.  08/29/18   Meccariello, Solmon Ice, DO  Prenatal Vit-Fe Fumarate-FA (PRENATAL MULTIVITAMIN) TABS tablet Take 1 tablet by mouth daily at 12 noon. 04/26/18   Cresenzo-Dishmon, Scarlette Calico, CNM  ranitidine (ZANTAC) 150 MG tablet Take 1 tablet (150 mg total) by mouth 2 (two) times daily. 04/13/18   Eber Hong, MD    Family History Family History  Problem Relation Age of Onset  . Diabetes Mother   . Diabetes Sister   . Breast cancer Maternal Grandmother   . Diabetes Maternal Grandmother   . Glaucoma Maternal Grandmother     Social History Social History   Tobacco Use  . Smoking status: Former Smoker    Types: Cigarettes  . Smokeless tobacco: Never Used  Substance Use Topics  . Alcohol use: Not Currently    Comment: 3-4 glassed vodka/beer(not while pregnant)  . Drug use: Not Currently     Types: Marijuana    Comment: no drug use since pregnant- cocaine last use 2018     Allergies   Bee venom; Cashew nut oil; Penicillins; Prednisone; Cocoa butter; Latex; and Shellfish allergy   Review of Systems Review of Systems  Constitutional: Negative for activity change, appetite change, chills and fever.  HENT: Negative for facial swelling, sore throat and trouble swallowing.   Respiratory: Negative for chest tightness, shortness of breath and wheezing.   Gastrointestinal: Negative for abdominal pain, nausea and vomiting.  Musculoskeletal: Negative for neck pain and neck stiffness.  Skin: Positive for rash. Negative for wound.  Neurological: Negative for dizziness, weakness, numbness and headaches.     Physical Exam Updated Vital Signs BP 112/78 (BP Location: Right Arm)   Pulse 90   Temp 98.2 F (36.8 C)   Resp 18   Ht 5\' 7"  (1.702 m)   Wt 83.9 kg   LMP 01/26/2018   SpO2 99%   BMI 28.98 kg/m   Physical Exam  Constitutional: She is oriented to person, place, and time. She appears well-developed and well-nourished. No distress.  HENT:  Head: Normocephalic and atraumatic.  Mouth/Throat: Oropharynx is clear and moist.  Neck: Normal range of motion. Neck supple.  Cardiovascular: Normal rate, regular rhythm and intact distal pulses.  No murmur heard. Pulmonary/Chest: Effort normal and breath sounds normal. No respiratory distress.  Abdominal: There is no tenderness.  Abdomen is gravid, non-tender  Musculoskeletal: She exhibits no edema or tenderness.  Lymphadenopathy:    She has no cervical adenopathy.  Neurological: She is alert and oriented to person, place, and time. She exhibits normal muscle tone. Coordination normal.  Skin: Skin is warm. Capillary refill takes less than 2 seconds. Rash noted. There is erythema.  Few scattered erythematous papules to the upper back. No other rash seen,  No edema or vesicles  Nursing note and vitals reviewed.    ED  Treatments / Results  Labs (all labs ordered are listed, but only abnormal results are displayed) Labs Reviewed - No data to display  EKG None  Radiology No results found.  Procedures Procedures (including critical care time)  Medications Ordered in ED Medications - No data to display   Initial Impression / Assessment and Plan / ED Course  I have reviewed the triage vital signs and the nursing notes.  Pertinent labs & imaging results that were available during my care of the patient were reviewed by me and considered in my medical decision making (see chart for details).      FHT's 143 bpm  Pt well appearing.  Non-specific rash.  Vitals reassuring.  Appears appropriate for d/c home.  Will continue benadryl, agrees to 1% hydrocortisone cream if needed, has appt with OB on Thursday.    Final Clinical Impressions(s) / ED Diagnoses   Final diagnoses:  Rash and nonspecific skin eruption    ED Discharge Orders         Ordered    hydrocortisone cream 1 %     11/01/18 1321           Pauline Aus, PA-C 11/02/18 2147    Benjiman Core, MD 11/08/18 0004

## 2018-11-02 NOTE — ED Provider Notes (Signed)
[facility] EMERGENCY DEPARTMENT Provider Note   CSN: [id number] Arrival date & time: 11/02/18  2209     History   Chief Complaint Chief Complaint  Patient presents with  . Abdominal Pain    HPI Erin Krueger is a 22 y.o. female.  Pt presents to the ED today with abdominal pain.  She is [redacted] weeks pregnant and fell down a flight of stairs.  She is feeling the baby move.  She denies any vaginal bleeding.  Pt said her upper abdomen feels tight.  She denies any other injuries.     Past Medical History:  Diagnosis Date  . ADHD (attention deficit hyperactivity disorder)   . Allergy   . Anxiety   . Asthma   . Complication of anesthesia   . Eating disorder   . Headache(784.0)   . Kidney stone   . Migraines   . ODD (oppositional defiant disorder)   . PTSD (post-traumatic stress disorder)   . Seizures [facility])    diagnosed age 71    Patient Active Problem List   Diagnosis Date Noted  . Seizures (HCC) 08/28/2018  . Third trimester pregnancy   . Persistent UTI (urinary tract infection) during pregnancy 04/08/2018  . Marijuana use 04/07/2018  . Supervision of normal pregnancy Apr 08, 2018  . Smoker 04-08-2018  . Death of child 04-08-18  . 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 PID (acute pelvic inflammatory disease) 01/29/2016  . Seizure disorder (HCC) 01/07/2016  . Seizure (HCC) 01/07/2016  . 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 PTSD (post-traumatic stress disorder) 05/12/2013  . 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 Polysubstance abuse (HCC) 05/12/2013    Past Surgical History:  Procedure Laterality Date  . right knee surgery    . TONSILLECTOMY AND ADENOIDECTOMY       OB History    Gravida  3   Para  1   Term  1   Preterm      AB  1   Living  0     SAB  1   TAB      Ectopic      Multiple      Live Births  1            Home Medications    Prior to Admission medications   Medication Sig  Start Date End Date Taking? Authorizing Provider  acetaminophen (TYLENOL) 500 MG tablet Take 500 mg by mouth every 6 (six) hours as needed for mild pain, moderate pain, fever or headache.     [provider]  albuterol (PROVENTIL HFA;VENTOLIN HFA) 108 (90 Base) MCG/ACT inhaler Inhale 2 puffs into the lungs every 6 (six) hours as needed for wheezing or shortness of breath. 2018/04/08   Cheral Marker, CNM  EPINEPHrine 0.3 mg/0.3 mL IJ SOAJ injection Inject 0.3 mLs (0.3 mg total) into the muscle as needed. 06/27/18   Cheral Marker, CNM  folic acid (FOLVITE) 1 MG tablet Take 1 mg by mouth daily.    [provider]  hydrocortisone cream 1 % Apply to affected area 2 times daily 11/01/18   Triplett, Tammy, PA-C  levETIRAcetam (KEPPRA) 500 MG tablet Take 2 tablets (1,000 mg total) by mouth 2 (two) times daily. 08/29/18   Meccariello, Solmon Ice, DO  Prenatal Vit-Fe Fumarate-FA (PRENATAL MULTIVITAMIN) TABS tablet Take 1 tablet by mouth daily at 12 noon. 04/26/18   Cresenzo-Dishmon, Scarlette Calico, CNM  ranitidine (ZANTAC) 150 MG tablet Take 1 tablet (150 mg total) by mouth  2 (two) times daily. 04/13/18   Eber HongMiller, Brian, MD    Family History Family History  Problem Relation Age of Onset  . Diabetes Mother   . Diabetes Sister   . Breast cancer Maternal Grandmother   . Diabetes Maternal Grandmother   . Glaucoma Maternal Grandmother     Social History Social History   Tobacco Use  . Smoking status: Former Smoker    Types: Cigarettes  . Smokeless tobacco: Never Used  Substance Use Topics  . Alcohol use: Not Currently    Comment: 3-4 glassed vodka/beer(not while pregnant)  . Drug use: Not Currently    Types: Marijuana    Comment: no drug use since pregnant- cocaine last use 2018     Allergies   Bee venom; Cashew nut oil; Penicillins; Prednisone; Cocoa butter; Latex; and Shellfish allergy   Review of Systems Review of Systems  Gastrointestinal: Positive for abdominal pain.  All  other systems reviewed and are negative.    Physical Exam Updated Vital Signs BP (!) 130/98   Pulse 70   Temp 98.2 F (36.8 C)   Resp 18   Ht 5\' 7"  (1.702 m)   Wt 84 kg   LMP 01/26/2018   SpO2 100%   BMI 29.00 kg/m   Physical Exam  Constitutional: She is oriented to person, place, and time. She appears well-developed and well-nourished.  HENT:  Head: Normocephalic and atraumatic.  Mouth/Throat: Oropharynx is clear and moist.  Eyes: Pupils are equal, round, and reactive to light. EOM are normal.  Cardiovascular: Normal rate, regular rhythm, normal heart sounds and intact distal pulses.  Pulmonary/Chest: Effort normal and breath sounds normal.  Abdominal: Bowel sounds are normal. There is tenderness in the epigastric area.  Gravid abdomen  Genitourinary:  Genitourinary Comments: Dilated 2 cm, soft cervix  Neurological: She is alert and oriented to person, place, and time.  Skin: Skin is warm. Capillary refill takes less than 2 seconds.  Psychiatric: She has a normal mood and affect. Her behavior is normal.  Nursing note and vitals reviewed.    ED Treatments / Results  Labs (all labs ordered are listed, but only abnormal results are displayed) Labs Reviewed - No data to display  EKG None  Radiology No results found.  Procedures Procedures (including critical care time)  Medications Ordered in ED Medications  sodium chloride 0.9 % bolus 1,000 mL (1,000 mLs Intravenous New Bag/Given 11/02/18 2234)  acetaminophen (TYLENOL) tablet 650 mg (650 mg Oral Given 11/02/18 2233)     Initial Impression / Assessment and Plan / ED Course  I have reviewed the triage vital signs and the nursing notes.  Pertinent labs & imaging results that were available during my care of the patient were reviewed by me and considered in my medical decision making (see chart for details).    Rapid OB response called.  Pt d/w Dr. Jolayne Pantheronstant (OBGyn).  FHTs are good.  She said pt is  contracting about every 4-5 times per hour.  Good accelerations/no decels.  [facility]  Pt remains stable for transfer.  Final Clinical Impressions(s) / ED Diagnoses   Final diagnoses:  [redacted] weeks gestation of pregnancy  Fall, initial encounter    ED Discharge Orders    None       Jacalyn LefevreHaviland, Takyra Cantrall, MD 11/02/18 2315

## 2018-11-02 NOTE — ED Notes (Signed)
OB rapid response  

## 2018-11-02 NOTE — ED Triage Notes (Signed)
Pt is [redacted] weeks pregnant. Due date today. Pt c/o upper abdominal pain since falling head first down a flight of stairs x 10 minutes ago. Pt denies any vaginal bleeding or discharge.

## 2018-11-03 ENCOUNTER — Encounter: Payer: Medicaid Other | Admitting: Women's Health

## 2018-11-03 ENCOUNTER — Encounter (HOSPITAL_COMMUNITY): Payer: Self-pay

## 2018-11-03 DIAGNOSIS — W19XXXA Unspecified fall, initial encounter: Secondary | ICD-10-CM

## 2018-11-03 DIAGNOSIS — O99353 Diseases of the nervous system complicating pregnancy, third trimester: Secondary | ICD-10-CM

## 2018-11-03 DIAGNOSIS — O133 Gestational [pregnancy-induced] hypertension without significant proteinuria, third trimester: Secondary | ICD-10-CM

## 2018-11-03 DIAGNOSIS — N390 Urinary tract infection, site not specified: Secondary | ICD-10-CM

## 2018-11-03 DIAGNOSIS — G40909 Epilepsy, unspecified, not intractable, without status epilepticus: Secondary | ICD-10-CM

## 2018-11-03 DIAGNOSIS — O26893 Other specified pregnancy related conditions, third trimester: Secondary | ICD-10-CM

## 2018-11-03 LAB — COMPREHENSIVE METABOLIC PANEL
ALT: 8 U/L (ref 0–44)
AST: 12 U/L — ABNORMAL LOW (ref 15–41)
Albumin: 2.6 g/dL — ABNORMAL LOW (ref 3.5–5.0)
Alkaline Phosphatase: 107 U/L (ref 38–126)
Anion gap: 8 (ref 5–15)
BUN: 9 mg/dL (ref 6–20)
CO2: 22 mmol/L (ref 22–32)
Calcium: 8.6 mg/dL — ABNORMAL LOW (ref 8.9–10.3)
Chloride: 108 mmol/L (ref 98–111)
Creatinine, Ser: 0.56 mg/dL (ref 0.44–1.00)
GFR calc Af Amer: >60 mL/min (ref >60)
GFR calc non Af Amer: >60 mL/min (ref >60)
Glucose, Bld: 85 mg/dL (ref 70–99)
Potassium: 4 mmol/L (ref 3.5–5.1)
Sodium: 138 mmol/L (ref 135–145)
Total Bilirubin: 0.5 mg/dL (ref 0.3–1.2)
Total Protein: 5.9 g/dL — ABNORMAL LOW (ref 6.5–8.1)

## 2018-11-03 LAB — PROTEIN / CREATININE RATIO, URINE
Creatinine, Urine: 51 mg/dL
Total Protein, Urine: <6 mg/dL

## 2018-11-03 LAB — URINALYSIS, ROUTINE W REFLEX MICROSCOPIC
Bilirubin Urine: NEGATIVE
Glucose, UA: NEGATIVE mg/dL
Hgb urine dipstick: NEGATIVE
Ketones, ur: NEGATIVE mg/dL
Nitrite: POSITIVE — AB
Protein, ur: NEGATIVE mg/dL
Specific Gravity, Urine: 1.01 (ref 1.005–1.030)
pH: 6 (ref 5.0–8.0)

## 2018-11-03 LAB — CBC
HCT: 34.6 % — ABNORMAL LOW (ref 36.0–46.0)
Hemoglobin: 11.2 g/dL — ABNORMAL LOW (ref 12.0–15.0)
MCH: 29.2 pg (ref 26.0–34.0)
MCHC: 32.4 g/dL (ref 30.0–36.0)
MCV: 90.3 fL (ref 80.0–100.0)
Platelets: 212 10*3/uL (ref 150–400)
RBC: 3.83 MIL/uL — ABNORMAL LOW (ref 3.87–5.11)
RDW: 12.9 % (ref 11.5–15.5)
WBC: 13.8 10*3/uL — ABNORMAL HIGH (ref 4.0–10.5)
nRBC: 0 % (ref 0.0–0.2)

## 2018-11-03 NOTE — MAU Provider Note (Signed)
Chief Complaint:  Abdominal Pain   None     HPI: Erin Krueger is a 22 y.o. G3P1010 at 58w1dwho presents to maternity admissions as a transfer from [facility] ED after a fall occurred at home. She reports she tripped on her stairs at home, fell backwards, and hit her head. She did not hit her abdomen.  She denies any pain in her abdomen or her head. She denies contractions. There are no symptoms. She has not tried any treatments.  She has appt at [facility] today, 11/03/18 at noon. She reports good fetal movement.  HPI  Past Medical History: Past Medical History:  Diagnosis Date  . ADHD (attention deficit hyperactivity disorder)   . Allergy   . Anxiety   . Asthma   . Complication of anesthesia   . Eating disorder   . Headache(784.0)   . Kidney stone   . Migraines   . ODD (oppositional defiant disorder)   . PTSD (post-traumatic stress disorder)   . Seizures (HCC)    diagnosed age 11    Past obstetric history: OB History  Gravida Para Term Preterm AB Living  3 1 1   1  0  SAB TAB Ectopic Multiple Live Births  1       1    # Outcome Date GA Lbr Len/2nd Weight Sex Delivery Anes PTL Lv  3 Current           2 Term 06/23/17 [redacted]w[redacted]d  2948 g F Vag-Spont EPI N DEC     Birth Comments: cardiac defect  1 SAB             Past Surgical History: Past Surgical History:  Procedure Laterality Date  . right knee surgery    . TONSILLECTOMY AND ADENOIDECTOMY      Family History: Family History  Problem Relation Age of Onset  . Diabetes Mother   . Diabetes Sister   . Breast cancer Maternal Grandmother   . Diabetes Maternal Grandmother   . Glaucoma Maternal Grandmother     Social History: Social History   Tobacco Use  . Smoking status: Former Smoker    Types: Cigarettes  . Smokeless tobacco: Never Used  Substance Use Topics  . Alcohol use: Not Currently    Comment: 3-4 glassed vodka/beer(not while pregnant)  . Drug use: Not Currently    Types: Marijuana    Comment: no drug  use since pregnant- cocaine last use 2018    Allergies:  Allergies  Allergen Reactions  . Bee Venom Anaphylaxis  . Cashew Nut Oil Anaphylaxis  . Penicillins Anaphylaxis    Has patient had a PCN reaction causing immediate rash, facial/tongue/throat swelling, SOB or lightheadedness with hypotension: Unknown Has patient had a PCN reaction causing severe rash involving mucus membranes or skin necrosis: Unknown Has patient had a PCN reaction that required hospitalization: Unknown Has patient had a PCN reaction occurring within the last 10 years: Unknown If all of the above answers are "NO", then may proceed with Cephalosporin use.   . Prednisone Other (See Comments)    Bleeding nose bleeding and internal bleeding  . Cocoa Butter Rash  . Latex Rash  . Shellfish Allergy Hives and Rash    Meds:  Medications Prior to Admission  Medication Sig Dispense Refill Last Dose  . levETIRAcetam (KEPPRA) 500 MG tablet Take 2 tablets (1,000 mg total) by mouth 2 (two) times daily. 120 tablet 0 11/02/2018 at Unknown time  . Prenatal Vit-Fe Fumarate-FA (PRENATAL MULTIVITAMIN) TABS tablet  Take 1 tablet by mouth daily at 12 noon. 30 tablet 11 11/02/2018 at Unknown time  . acetaminophen (TYLENOL) 500 MG tablet Take 500 mg by mouth every 6 (six) hours as needed for mild pain, moderate pain, fever or headache.    Taking  . albuterol (PROVENTIL HFA;VENTOLIN HFA) 108 (90 Base) MCG/ACT inhaler Inhale 2 puffs into the lungs every 6 (six) hours as needed for wheezing or shortness of breath. 1 Inhaler 2 Unknown at Unknown time  . EPINEPHrine 0.3 mg/0.3 mL IJ SOAJ injection Inject 0.3 mLs (0.3 mg total) into the muscle as needed. 1 Device 0 Unknown at Unknown time  . folic acid (FOLVITE) 1 MG tablet Take 1 mg by mouth daily.   Taking  . hydrocortisone cream 1 % Apply to affected area 2 times daily 30 g 0 Unknown at Unknown time  . ranitidine (ZANTAC) 150 MG tablet Take 1 tablet (150 mg total) by mouth 2 (two) times  daily. 60 tablet 0 Taking    ROS:  Review of Systems  Constitutional: Negative for chills, fatigue and fever.  HENT: Negative for sinus pressure.   Eyes: Negative for photophobia and visual disturbance.  Respiratory: Negative for shortness of breath.   Cardiovascular: Negative for chest pain.  Gastrointestinal: Negative for abdominal pain, constipation, diarrhea, nausea and vomiting.  Genitourinary: Negative for difficulty urinating, dysuria, flank pain, frequency, pelvic pain, vaginal bleeding, vaginal discharge and vaginal pain.  Musculoskeletal: Negative for neck pain.  Neurological: Negative for dizziness, weakness and headaches.  Psychiatric/Behavioral: Negative.      I have reviewed patient's Past Medical Hx, Surgical Hx, Family Hx, Social Hx, medications and allergies.   Physical Exam   Patient Vitals for the past 24 hrs:  BP Temp Temp src Pulse Resp SpO2 Height Weight  11/03/18 0527 129/87 - - - - - - -  11/03/18 0508 129/87 - - 84 - - - -  11/03/18 0445 - - - - - 100 % - -  11/03/18 0440 - - - - - 98 % - -  11/03/18 0150 - - - - - 99 % - -  11/03/18 0146 111/66 - - 72 - - - -  11/03/18 0145 - - - - - 99 % - -  11/03/18 0140 - - - - - 99 % - -  11/03/18 0135 - - - - - 99 % - -  11/03/18 0131 105/65 - - 85 - - - -  11/03/18 0130 - - - - - 98 % - -  11/03/18 0128 131/80 - - 79 - - - -  11/03/18 0125 - - - - - 100 % - -  11/03/18 0120 - - - - - 98 % - -  11/03/18 0115 - - - - - 99 % - -  11/03/18 0110 - - - - - 98 % - -  11/03/18 0105 - 97.7 F (36.5 C) Oral - 18 97 % - -  11/03/18 0017 - - - - - 99 % - -  11/03/18 0016 - - - - - 99 % - -  11/03/18 0015 - - - - - 100 % - -  11/03/18 0014 - - - - - 100 % - -  11/03/18 0013 - - - - - 99 % - -  11/03/18 0012 - - - - - 100 % - -  11/03/18 0011 - - - - - 98 % - -  11/03/18 0010 - - - - -  100 % - -  11/03/18 0009 - - - - - 99 % - -  11/03/18 0008 - - - - - 99 % - -  11/03/18 0007 - - - - - 100 % - -  11/02/18 2359  - - - - - 99 % - -  11/02/18 2358 - - - - - 96 % - -  11/02/18 2357 - - - - - 94 % - -  11/02/18 2356 - - - - - 97 % - -  11/02/18 2355 - - - - - 98 % - -  11/02/18 2354 - - - - - 98 % - -  11/02/18 2345 125/85 - - 77 18 99 % - -  11/02/18 2330 (!) 132/91 - - 72 19 100 % - -  11/02/18 2315 (!) 129/96 - - 67 18 100 % - -  11/02/18 2307 (!) 132/96 - - 78 19 100 % - -  11/02/18 2245 (!) 130/98 - - 70 18 100 % - -  11/02/18 2230 (!) 137/109 - - 78 18 99 % - -  11/02/18 2220 (!) 145/105 98.2 F (36.8 C) - 81 18 100 % - -  11/02/18 2218 - - - - - - 5\' 7"  (1.702 m) 84 kg   Constitutional: Well-developed, well-nourished female in no acute distress.  Cardiovascular: normal rate Respiratory: normal effort GI: Abd soft, non-tender, gravid appropriate for gestational age.  MS: Extremities nontender, no edema, normal ROM Neurologic: Alert and oriented x 4.  GU: Neg CVAT.  PELVIC EXAM: Cervix pink, visually closed, without lesion, scant white creamy discharge, vaginal walls and external genitalia normal Bimanual exam: Cervix 0/long/high, firm, anterior, neg CMT, uterus nontender, nonenlarged, adnexa without tenderness, enlargement, or mass  Dilation: Fingertip Exam by:: Fanny Bien, MD  FHT:  Baseline 125, moderate variability, accelerations present, no decelerations Contractions: None on toco or to palpation   Labs: Results for orders placed or performed during the hospital encounter of 11/02/18 (from the past 24 hour(s))  Urinalysis, Routine w reflex microscopic     Status: Abnormal   Collection Time: 11/03/18  2:45 AM  Result Value Ref Range   Color, Urine YELLOW YELLOW   APPearance CLEAR CLEAR   Specific Gravity, Urine 1.010 1.005 - 1.030   pH 6.0 5.0 - 8.0   Glucose, UA NEGATIVE NEGATIVE mg/dL   Hgb urine dipstick NEGATIVE NEGATIVE   Bilirubin Urine NEGATIVE NEGATIVE   Ketones, ur NEGATIVE NEGATIVE mg/dL   Protein, ur NEGATIVE NEGATIVE mg/dL   Nitrite POSITIVE (A) NEGATIVE    Leukocytes, UA SMALL (A) NEGATIVE   RBC / HPF 0-5 0 - 5 RBC/hpf   WBC, UA 11-20 0 - 5 WBC/hpf   Bacteria, UA MANY (A) NONE SEEN   Squamous Epithelial / LPF 0-5 0 - 5   Mucus PRESENT   Protein / creatinine ratio, urine     Status: None   Collection Time: 11/03/18  2:45 AM  Result Value Ref Range   Creatinine, Urine 51.00 mg/dL   Total Protein, Urine <6.00 mg/dL   Protein Creatinine Ratio        0.00 - 0.15 mg/mg[Cre]  CBC     Status: Abnormal   Collection Time: 11/03/18  3:48 AM  Result Value Ref Range   WBC 13.8 (H) 4.0 - 10.5 K/uL   RBC 3.83 (L) 3.87 - 5.11 MIL/uL   Hemoglobin 11.2 (L) 12.0 - 15.0 g/dL   HCT 16.1 (L) 09.6 - 04.5 %  MCV 90.3 80.0 - 100.0 fL   MCH 29.2 26.0 - 34.0 pg   MCHC 32.4 30.0 - 36.0 g/dL   RDW 86.5 78.4 - 69.6 %   Platelets 212 150 - 400 K/uL   nRBC 0.0 0.0 - 0.2 %  Comprehensive metabolic panel     Status: Abnormal   Collection Time: 11/03/18  3:48 AM  Result Value Ref Range   Sodium 138 135 - 145 mmol/L   Potassium 4.0 3.5 - 5.1 mmol/L   Chloride 108 98 - 111 mmol/L   CO2 22 22 - 32 mmol/L   Glucose, Bld 85 70 - 99 mg/dL   BUN 9 6 - 20 mg/dL   Creatinine, Ser 2.95 0.44 - 1.00 mg/dL   Calcium 8.6 (L) 8.9 - 10.3 mg/dL   Total Protein 5.9 (L) 6.5 - 8.1 g/dL   Albumin 2.6 (L) 3.5 - 5.0 g/dL   AST 12 (L) 15 - 41 U/L   ALT 8 0 - 44 U/L   Alkaline Phosphatase 107 38 - 126 U/L   Total Bilirubin 0.5 0.3 - 1.2 mg/dL   GFR calc non Af Amer >60 >60 mL/min   GFR calc Af Amer >60 >60 mL/min   Anion gap 8 5 - 15   --/--/O POS Performed at [facility], [address], [place], [place] [address]  (10/01 2304)  Imaging:  No results found.  MAU Course/MDM: I have ordered labs and reviewed results.  NST reviewed and reactive x 5 hours Pt with HTN at [facility], but no HTN entire time in MAU Preeclampsia labs wnl Without HTN 4 hours apart, does not meet criteria for gestational HTN diagnosis No s/sx of preeclampsia Pt to keep appt in office today for  BP check, schedule IOL at 41 weeks Continue Macrobid daily for UTI suppression Preeclampsia precautions/reasons to return reviewed Pt discharge with strict preeclampsia and labor precautions.    Assessment: 1. Fall, initial encounter   2. [redacted] weeks gestation of pregnancy   3. Encounter for supervision of other normal pregnancy in third trimester   4. Frequent UTI   5. Seizure disorder during pregnancy in third trimester (HCC)   6. Transient hypertension of pregnancy in third trimester     Plan: Discharge home Labor precautions and fetal kick counts  Follow-up Information    [facility] Follow up.   Why:  Keep scheduled appt on Thursday, 11/03/18, at noon or call for blood pressure check with RN Thursday or Friday.  Return to MAU as needed for emergencies. Contact information: [address] [place] [address] [phone]         Allergies as of 11/03/2018      Reactions   Bee Venom Anaphylaxis   Cashew Nut Oil Anaphylaxis   Penicillins Anaphylaxis   Has patient had a PCN reaction causing immediate rash, facial/tongue/throat swelling, SOB or lightheadedness with hypotension: Unknown Has patient had a PCN reaction causing severe rash involving mucus membranes or skin necrosis: Unknown Has patient had a PCN reaction that required hospitalization: Unknown Has patient had a PCN reaction occurring within the last 10 years: Unknown If all of the above answers are "NO", then may proceed with Cephalosporin use.   Prednisone Other (See Comments)   Bleeding nose bleeding and internal bleeding   Cocoa Butter Rash   Latex Rash   Shellfish Allergy Hives, Rash      Medication List    TAKE these medications   acetaminophen 500 MG  tablet Commonly known as:  TYLENOL Take 500 mg by mouth every 6 (six) hours as needed for mild pain, moderate pain, fever or headache.   albuterol 108 (90 Base) MCG/ACT inhaler Commonly known as:  PROVENTIL HFA;VENTOLIN  HFA Inhale 2 puffs into the lungs every 6 (six) hours as needed for wheezing or shortness of breath.   EPINEPHrine 0.3 mg/0.3 mL Soaj injection Commonly known as:  EPI-PEN Inject 0.3 mLs (0.3 mg total) into the muscle as needed.   folic acid 1 MG tablet Commonly known as:  FOLVITE Take 1 mg by mouth daily.   hydrocortisone cream 1 % Apply to affected area 2 times daily   levETIRAcetam 500 MG tablet Commonly known as:  KEPPRA Take 2 tablets (1,000 mg total) by mouth 2 (two) times daily.   prenatal multivitamin Tabs tablet Take 1 tablet by mouth daily at 12 noon.   ranitidine 150 MG tablet Commonly known as:  ZANTAC Take 1 tablet (150 mg total) by mouth 2 (two) times daily.       Sharen CounterLisa Leftwich-Kirby Certified Nurse-Midwife 11/03/2018 5:29 AM

## 2018-11-03 NOTE — Discharge Instructions (Signed)

## 2018-11-07 ENCOUNTER — Encounter (HOSPITAL_COMMUNITY): Payer: Self-pay | Admitting: *Deleted

## 2018-11-07 ENCOUNTER — Inpatient Hospital Stay (HOSPITAL_COMMUNITY)
Admission: AD | Admit: 2018-11-07 | Discharge: 2018-11-10 | DRG: 806 | Disposition: A | Discharge status: Home / Self Care | Payer: Medicaid Other | Attending: Obstetrics and Gynecology | Admitting: Obstetrics and Gynecology

## 2018-11-07 DIAGNOSIS — O99354 Diseases of the nervous system complicating childbirth: Principal | ICD-10-CM | POA: Diagnosis present

## 2018-11-07 DIAGNOSIS — O99324 Drug use complicating childbirth: Secondary | ICD-10-CM | POA: Diagnosis present

## 2018-11-07 DIAGNOSIS — J45909 Unspecified asthma, uncomplicated: Secondary | ICD-10-CM | POA: Diagnosis present

## 2018-11-07 DIAGNOSIS — G40919 Epilepsy, unspecified, intractable, without status epilepticus: Secondary | ICD-10-CM

## 2018-11-07 DIAGNOSIS — O9952 Diseases of the respiratory system complicating childbirth: Secondary | ICD-10-CM | POA: Diagnosis present

## 2018-11-07 DIAGNOSIS — F17210 Nicotine dependence, cigarettes, uncomplicated: Secondary | ICD-10-CM | POA: Diagnosis present

## 2018-11-07 DIAGNOSIS — O1414 Severe pre-eclampsia complicating childbirth: Secondary | ICD-10-CM | POA: Diagnosis present

## 2018-11-07 DIAGNOSIS — Z3A40 40 weeks gestation of pregnancy: Secondary | ICD-10-CM

## 2018-11-07 DIAGNOSIS — Z880 Allergy status to penicillin: Secondary | ICD-10-CM

## 2018-11-07 DIAGNOSIS — O99334 Smoking (tobacco) complicating childbirth: Secondary | ICD-10-CM | POA: Diagnosis present

## 2018-11-07 DIAGNOSIS — F1210 Cannabis abuse, uncomplicated: Secondary | ICD-10-CM | POA: Diagnosis present

## 2018-11-07 DIAGNOSIS — R569 Unspecified convulsions: Secondary | ICD-10-CM

## 2018-11-07 DIAGNOSIS — Z3493 Encounter for supervision of normal pregnancy, unspecified, third trimester: Secondary | ICD-10-CM

## 2018-11-07 DIAGNOSIS — O1413 Severe pre-eclampsia, third trimester: Secondary | ICD-10-CM | POA: Diagnosis not present

## 2018-11-07 HISTORY — DX: Acute parametritis and pelvic cellulitis: N73.0

## 2018-11-07 LAB — COMPREHENSIVE METABOLIC PANEL WITH GFR
ALT: 9 U/L (ref 0–44)
AST: 11 U/L — ABNORMAL LOW (ref 15–41)
Albumin: 2.5 g/dL — ABNORMAL LOW (ref 3.5–5.0)
Alkaline Phosphatase: 118 U/L (ref 38–126)
Anion gap: 7 (ref 5–15)
BUN: 7 mg/dL (ref 6–20)
CO2: 19 mmol/L — ABNORMAL LOW (ref 22–32)
Calcium: 8.3 mg/dL — ABNORMAL LOW (ref 8.9–10.3)
Chloride: 110 mmol/L (ref 98–111)
Creatinine, Ser: 0.46 mg/dL (ref 0.44–1.00)
GFR calc Af Amer: >60 mL/min (ref >60)
GFR calc non Af Amer: >60 mL/min (ref >60)
Glucose, Bld: 88 mg/dL (ref 70–99)
Potassium: 3.8 mmol/L (ref 3.5–5.1)
Sodium: 136 mmol/L (ref 135–145)
Total Bilirubin: 0.5 mg/dL (ref 0.3–1.2)
Total Protein: 5.7 g/dL — ABNORMAL LOW (ref 6.5–8.1)

## 2018-11-07 LAB — CBC
HCT: 36.3 % (ref 36.0–46.0)
Hemoglobin: 11.8 g/dL — ABNORMAL LOW (ref 12.0–15.0)
MCH: 29.3 pg (ref 26.0–34.0)
MCHC: 32.5 g/dL (ref 30.0–36.0)
MCV: 90.1 fL (ref 80.0–100.0)
Platelets: 250 10*3/uL (ref 150–400)
RBC: 4.03 MIL/uL (ref 3.87–5.11)
RDW: 12.9 % (ref 11.5–15.5)
WBC: 13.9 10*3/uL — ABNORMAL HIGH (ref 4.0–10.5)
nRBC: 0 % (ref 0.0–0.2)

## 2018-11-07 LAB — RAPID URINE DRUG SCREEN, HOSP PERFORMED
Amphetamines: NOT DETECTED
Barbiturates: NOT DETECTED
Benzodiazepines: NOT DETECTED
Cocaine: NOT DETECTED
Opiates: NOT DETECTED
Tetrahydrocannabinol: NOT DETECTED

## 2018-11-07 LAB — PROTEIN / CREATININE RATIO, URINE
Creatinine, Urine: 34 mg/dL
Total Protein, Urine: <6 mg/dL

## 2018-11-07 LAB — GROUP B STREP BY PCR: Group B strep by PCR: NEGATIVE

## 2018-11-07 LAB — TYPE AND SCREEN
ABO/RH(D): O POS
Antibody Screen: NEGATIVE

## 2018-11-07 LAB — MRSA PCR SCREENING: MRSA by PCR: POSITIVE — AB

## 2018-11-07 MED ORDER — SOD CITRATE-CITRIC ACID 500-334 MG/5ML PO SOLN: 30.0000 mL | ORAL | Status: DC | PRN

## 2018-11-07 MED ORDER — BUDESONIDE 0.25 MG/2ML IN SUSP
0.2500 mg | Freq: Two times a day (BID) | RESPIRATORY_TRACT | Status: DC
  Administered 2018-11-08 – 2018-11-09 (×3): 0.25 mg via RESPIRATORY_TRACT
  Filled 2018-11-07 (×8): qty 2

## 2018-11-07 MED ORDER — NITROFURANTOIN MONOHYD MACRO 100 MG PO CAPS
100.0000 mg | ORAL_CAPSULE | Freq: Every day | ORAL | Status: DC
  Administered 2018-11-07: 100 mg via ORAL
  Filled 2018-11-07: qty 1

## 2018-11-07 MED ORDER — ACETAMINOPHEN 325 MG PO TABS
650.0000 mg | ORAL_TABLET | ORAL | Status: DC | PRN
  Administered 2018-11-07 – 2018-11-08 (×2): 650 mg via ORAL
  Filled 2018-11-07 (×2): qty 2

## 2018-11-07 MED ORDER — OXYTOCIN 40 UNITS IN LACTATED RINGERS INFUSION - SIMPLE MED
1.0000 m[IU]/min | INTRAVENOUS | Status: DC
  Administered 2018-11-07: 2 m[IU]/min via INTRAVENOUS
  Filled 2018-11-07: qty 1000

## 2018-11-07 MED ORDER — OXYTOCIN 40 UNITS IN LACTATED RINGERS INFUSION - SIMPLE MED: 2.5000 [IU]/h | INTRAVENOUS | Status: DC

## 2018-11-07 MED ORDER — LACTATED RINGERS IV SOLN: 500.0000 mL | INTRAVENOUS | Status: DC | PRN

## 2018-11-07 MED ORDER — LACTATED RINGERS IV SOLN
INTRAVENOUS | Status: DC
  Administered 2018-11-07 – 2018-11-08 (×2): via INTRAVENOUS

## 2018-11-07 MED ORDER — TERBUTALINE SULFATE 1 MG/ML IJ SOLN: 0.2500 mg | Freq: Once | INTRAMUSCULAR | Status: DC | PRN

## 2018-11-07 MED ORDER — ONDANSETRON HCL 4 MG/2ML IJ SOLN
4.0000 mg | Freq: Four times a day (QID) | INTRAMUSCULAR | Status: DC | PRN
  Administered 2018-11-08: 4 mg via INTRAVENOUS
  Filled 2018-11-07: qty 2

## 2018-11-07 MED ORDER — OXYCODONE-ACETAMINOPHEN 5-325 MG PO TABS: 2.0000 | ORAL_TABLET | ORAL | Status: DC | PRN

## 2018-11-07 MED ORDER — LEVETIRACETAM 500 MG PO TABS
1000.0000 mg | ORAL_TABLET | Freq: Two times a day (BID) | ORAL | Status: DC
  Administered 2018-11-07 – 2018-11-08 (×2): 1000 mg via ORAL
  Filled 2018-11-07 (×2): qty 2

## 2018-11-07 MED ORDER — OXYTOCIN BOLUS FROM INFUSION
500.0000 mL | Freq: Once | INTRAVENOUS | Status: AC
  Administered 2018-11-08: 500 mL/h via INTRAVENOUS

## 2018-11-07 MED ORDER — LIDOCAINE HCL (PF) 1 % IJ SOLN
30.0000 mL | INTRAMUSCULAR | Status: DC | PRN
  Filled 2018-11-07: qty 30

## 2018-11-07 MED ORDER — OXYCODONE-ACETAMINOPHEN 5-325 MG PO TABS: 1.0000 | ORAL_TABLET | ORAL | Status: DC | PRN

## 2018-11-07 NOTE — Progress Notes (Signed)
Side rails paded for seizure precaution.

## 2018-11-07 NOTE — H&P (Addendum)
LABOR AND DELIVERY ADMISSION HISTORY AND PHYSICAL NOTE  Erin Krueger is a 22 y.o. female G3P1010 with IUP at 3460w5d by presenting for IOL for seizures. She reports a seizure 1 week ago (Wednesday), Friday, and this afternoon (lasted 3 minutes). Reports HA for 3 days that is now improved, denies dizziness or blurry vision.  She reports positive fetal movement. She denies leakage of fluid or vaginal bleeding.  Prenatal History/Complications: [facility], limited prenatal care Pregnancy complications:  - Hx of baby with cardiac defect, living. Previous records indicated intraoperative death at 7 months but she says that was her sister's baby and her child is living. 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 ADHD (attention deficit hyperactivity disorder)   . Allergy   . Anxiety   . Asthma   . Eating disorder   . Headache(784.0)   . Kidney stone    kidney stones  . Migraines   . ODD (oppositional defiant disorder)   . PTSD (post-traumatic stress disorder)   . Seizures (HCC)    diagnosed age 89    Past Surgical History: Past Surgical History:  Procedure Laterality Date  . right knee surgery    . TONSILLECTOMY AND ADENOIDECTOMY      Obstetrical History: OB History    Gravida  3   Para  1   Term  1   Preterm      AB  1   Living  0     SAB  1   TAB      Ectopic      Multiple      Live Births  1           Social History: Social History   Socioeconomic History  . Marital status: Single    Spouse name: Not on file  . Number of children: Not on file  . Years of education: Not on file  .  Highest education level: Not on file  Occupational History  . Not on file  Social Needs  . Financial resource strain: Not on file  . Food insecurity:    Worry: Not on file    Inability: Not on file  . Transportation needs:    Medical: Not on file    Non-medical: Not on file  Tobacco Use  . Smoking status: Current Every Day Smoker    Types: Cigarettes, E-cigarettes  . Smokeless tobacco: Never Used  . Tobacco comment: uses e-cig daily   Substance and Sexual Activity  . Alcohol use: Not Currently    Comment: 3-4 glassed vodka/beer(not while pregnant)  . Drug use: Not Currently    Types: Marijuana    Comment: no drug use since pregnant- cocaine last use 2018  . Sexual activity: Yes    Birth control/protection: None  Lifestyle  . Physical activity:    Days per week: Not on file    Minutes per session: Not on file  . Stress: Not on file  Relationships  . Social connections:    Talks on phone: Not on file    Gets together: Not on file    Attends religious service:  Not on file    Active member of club or organization: Not on file    Attends meetings of clubs or organizations: Not on file    Relationship status: Not on file  Other Topics Concern  . Not on file  Social History Narrative  . Not on file    Family History: Family History  Problem Relation Age of Onset  . Diabetes Mother   . Diabetes Sister   . Breast cancer Maternal Grandmother   . Diabetes Maternal Grandmother   . Glaucoma Maternal Grandmother     Allergies: Allergies  Allergen Reactions  . Bee Venom Anaphylaxis  . Cashew Nut Oil Anaphylaxis  . Penicillins Anaphylaxis    Has patient had a PCN reaction causing immediate rash, facial/tongue/throat swelling, SOB or lightheadedness with hypotension: Unknown Has patient had a PCN reaction causing severe rash involving mucus membranes or skin necrosis: Unknown Has patient had a PCN reaction that required hospitalization: Unknown Has patient had a PCN  reaction occurring within the last 10 years: Unknown If all of the above answers are "NO", then may proceed with Cephalosporin use.   . Prednisone Other (See Comments)    Bleeding nose bleeding and internal bleeding  . Cocoa Butter Rash  . Latex Rash  . Shellfish Allergy Hives and Rash    Medications Prior to Admission  Medication Sig Dispense Refill Last Dose  . acetaminophen (TYLENOL) 500 MG tablet Take 500 mg by mouth every 6 (six) hours as needed for mild pain, moderate pain, fever or headache.    Taking  . albuterol (PROVENTIL HFA;VENTOLIN HFA) 108 (90 Base) MCG/ACT inhaler Inhale 2 puffs into the lungs every 6 (six) hours as needed for wheezing or shortness of breath. 1 Inhaler 2 Unknown at Unknown time  . EPINEPHrine 0.3 mg/0.3 mL IJ SOAJ injection Inject 0.3 mLs (0.3 mg total) into the muscle as needed. 1 Device 0 Unknown at Unknown time  . folic acid (FOLVITE) 1 MG tablet Take 1 mg by mouth daily.   Taking  . hydrocortisone cream 1 % Apply to affected area 2 times daily 30 g 0 Unknown at Unknown time  . levETIRAcetam (KEPPRA) 500 MG tablet Take 2 tablets (1,000 mg total) by mouth 2 (two) times daily. 120 tablet 0 11/02/2018 at Unknown time  . Prenatal Vit-Fe Fumarate-FA (PRENATAL MULTIVITAMIN) TABS tablet Take 1 tablet by mouth daily at 12 noon. 30 tablet 11 11/02/2018 at Unknown time  . ranitidine (ZANTAC) 150 MG tablet Take 1 tablet (150 mg total) by mouth 2 (two) times daily. 60 tablet 0 Taking     Review of Systems  All systems reviewed and negative except as stated in HPI  Physical Exam Blood pressure 127/84, pulse 75, temperature 97.9 F (36.6 C), temperature source Oral, resp. rate 18, height 5\' 6"  (1.676 m), weight 83.9 kg, last menstrual period 01/26/2018, unknown if currently breastfeeding. General appearance: alert, oriented, NAD Lungs: normal respiratory effort Heart: regular rate Abdomen: soft, non-tender; gravid, FH appropriate for GA Extremities: No calf  swelling or tenderness Presentation: Unk Fetal monitoring: Baseline FHR 130, 15x15 Uterine activity: Membranes intact    Prenatal labs: ABO, Rh: --/--/O POS (12/16 2030) Antibody: NEG (12/16 2030) Rubella: 8.93 (05/14 1533) RPR: Non Reactive (09/23 0912)  HBsAg: Negative (05/14 1533)  HIV: Non Reactive (09/23 0912)  GC/Chlamydia: Neg 08/24/09 GBS:  Unk, PCR pending 2-hr GTT: 74 Genetic screening: None Anatomy US: 09/29/18: fetal growth at 34th percentile, 2456g  Prenatal Transfer Tool  Maternal Diabetes:  No Genetic Screening: Declined Maternal Ultrasounds/Referrals: Normal Fetal Ultrasounds or other Referrals:  None (echo recommended) Maternal Substance Abuse:  No Significant Maternal Medications:  Meds include: Other:  Levetiracetam 500 mg BID, albuterol, folic acid, multivitamin. Significant Maternal Lab Results: None  Results for orders placed or performed during the hospital encounter of 11/07/18 (from the past 24 hour(s))  Protein / creatinine ratio, urine   Collection Time: 11/07/18  8:05 PM  Result Value Ref Range   Creatinine, Urine 34.00 mg/dL   Total Protein, Urine <6.00 mg/dL   Protein Creatinine Ratio        0.00 - 0.15 mg/mg[Cre]  Comprehensive metabolic panel   Collection Time: 11/07/18  8:30 PM  Result Value Ref Range   Sodium 136 135 - 145 mmol/L   Potassium 3.8 3.5 - 5.1 mmol/L   Chloride 110 98 - 111 mmol/L   CO2 19 (L) 22 - 32 mmol/L   Glucose, Bld 88 70 - 99 mg/dL   BUN 7 6 - 20 mg/dL   Creatinine, Ser 1.61 0.44 - 1.00 mg/dL   Calcium 8.3 (L) 8.9 - 10.3 mg/dL   Total Protein 5.7 (L) 6.5 - 8.1 g/dL   Albumin 2.5 (L) 3.5 - 5.0 g/dL   AST 11 (L) 15 - 41 U/L   ALT 9 0 - 44 U/L   Alkaline Phosphatase 118 38 - 126 U/L   Total Bilirubin 0.5 0.3 - 1.2 mg/dL   GFR calc non Af Amer >60 >60 mL/min   GFR calc Af Amer >60 >60 mL/min   Anion gap 7 5 - 15  CBC   Collection Time: 11/07/18  8:30 PM  Result Value Ref Range   WBC 13.9 (H) 4.0 - 10.5 K/uL    RBC 4.03 3.87 - 5.11 MIL/uL   Hemoglobin 11.8 (L) 12.0 - 15.0 g/dL   HCT 09.6 04.5 - 40.9 %   MCV 90.1 80.0 - 100.0 fL   MCH 29.3 26.0 - 34.0 pg   MCHC 32.5 30.0 - 36.0 g/dL   RDW 81.1 91.4 - 78.2 %   Platelets 250 150 - 400 K/uL   nRBC 0.0 0.0 - 0.2 %  Type and screen [facility]   Collection Time: 11/07/18  8:30 PM  Result Value Ref Range   ABO/RH(D) O POS    Antibody Screen NEG    Sample Expiration      11/10/2018 Performed at [facility], [address], [place], [place] [address]     Patient Active Problem List   Diagnosis Date Noted  . Seizures (HCC) 08/28/2018  . Third trimester pregnancy   . Persistent UTI (urinary tract infection) during pregnancy 04/08/2018  . Marijuana use 04/07/2018  . Supervision of normal pregnancy 24-Apr-2018  . Smoker 04-24-2018  . Death of child 24-Apr-2018  . 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 PID (acute pelvic inflammatory disease) 01/29/2016  . Seizure disorder (HCC) 01/07/2016  . Seizure (HCC) 01/07/2016  . 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 PTSD (post-traumatic stress disorder) 05/12/2013  . 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 Polysubstance abuse (HCC) 05/12/2013    Assessment: Erin Krueger is a 22 y.o. G3P1010 at [redacted]w[redacted]d here for IOL due to seizures. She is on Keppra 1000mg  BID. Pre-eclampsia labs pending. Mild improved HA, no visual changes or dizziness. UDS pending given positive cannabanoids 5/19.   #Labor: IOL, membranes intact. Plan for Pitocin. #Pain: Epidural #FWB: Category 1 #ID: GBS unk, PCR pending. Trich +, treated 10/2.  Numerous UTI's, started Macrobid for suppression (1 month). #MOF: Breast and bottle #MOC:Depo or oral contraception? #Circ: Yes, outpatient  Trenda Moots 11/07/2018, 9:39 PM   OB FELLOW HISTORY AND PHYSICAL ATTESTATION  I have seen and examined this patient; I agree with above documentation in the medical student's  note.   Marcy Siren, D.O. OB Fellow  11/07/2018, 11:00 PM

## 2018-11-07 NOTE — MAU Provider Note (Addendum)
History     CSN: [id number]  Arrival date and time: 11/07/18 8295   First Provider Initiated Contact with Patient 11/07/18 1930      Chief Complaint  Patient presents with  . Seizures   HPI  Ms.  Erin Krueger is a 22 y.o. year old G34P1010 female at [redacted]w[redacted]d weeks gestation who brought to MAU by EMS from [facility] after having a seizure at 1815 this evening. She reports she and her boyfriend were play fighting on the bed when she began seizing. Her boyfriend reports that he thought she was asleep at first, but then knew she was seizing when she started shaking. He turned on her LT side, called her OB office and EMS. He reports that her seizure lasted 3 mins. She denies any trauma to head or abdomen. She reports a "bad H/A at the back of her head." She takes Keppra 1000 mg BID. She had her last dose at 0900 this morning. She has an OB visit at [facility] on 11/11/18. She reports good (+) FM.  Past Medical History:  Diagnosis Date  . ADHD (attention deficit hyperactivity disorder)   . Allergy   . Anxiety   . Asthma   . Complication of anesthesia   . Eating disorder   . Headache(784.0)   . Kidney stone   . Migraines   . ODD (oppositional defiant disorder)   . PTSD (post-traumatic stress disorder)   . Seizures (HCC)    diagnosed age 21    Past Surgical History:  Procedure Laterality Date  . right knee surgery    . TONSILLECTOMY AND ADENOIDECTOMY      Family History  Problem Relation Age of Onset  . Diabetes Mother   . Diabetes Sister   . Breast cancer Maternal Grandmother   . Diabetes Maternal Grandmother   . Glaucoma Maternal Grandmother     Social History   Tobacco Use  . Smoking status: Former Smoker    Types: Cigarettes  . Smokeless tobacco: Never Used  Substance Use Topics  . Alcohol use: Not Currently    Comment: 3-4 glassed vodka/beer(not while pregnant)  . Drug use: Not Currently    Types: Marijuana    Comment: no drug use since pregnant-  cocaine last use 2018    Allergies:  Allergies  Allergen Reactions  . Bee Venom Anaphylaxis  . Cashew Nut Oil Anaphylaxis  . Penicillins Anaphylaxis    Has patient had a PCN reaction causing immediate rash, facial/tongue/throat swelling, SOB or lightheadedness with hypotension: Unknown Has patient had a PCN reaction causing severe rash involving mucus membranes or skin necrosis: Unknown Has patient had a PCN reaction that required hospitalization: Unknown Has patient had a PCN reaction occurring within the last 10 years: Unknown If all of the above answers are "NO", then may proceed with Cephalosporin use.   . Prednisone Other (See Comments)    Bleeding nose bleeding and internal bleeding  . Cocoa Butter Rash  . Latex Rash  . Shellfish Allergy Hives and Rash    Medications Prior to Admission  Medication Sig Dispense Refill Last Dose  . acetaminophen (TYLENOL) 500 MG tablet Take 500 mg by mouth every 6 (six) hours as needed for mild pain, moderate pain, fever or headache.    Taking  . albuterol (PROVENTIL HFA;VENTOLIN HFA) 108 (90 Base) MCG/ACT inhaler Inhale 2 puffs into the lungs every 6 (six) hours as needed for wheezing or shortness of breath. 1 Inhaler 2 Unknown at Unknown time  .  EPINEPHrine 0.3 mg/0.3 mL IJ SOAJ injection Inject 0.3 mLs (0.3 mg total) into the muscle as needed. 1 Device 0 Unknown at Unknown time  . folic acid (FOLVITE) 1 MG tablet Take 1 mg by mouth daily.   Taking  . hydrocortisone cream 1 % Apply to affected area 2 times daily 30 g 0 Unknown at Unknown time  . levETIRAcetam (KEPPRA) 500 MG tablet Take 2 tablets (1,000 mg total) by mouth 2 (two) times daily. 120 tablet 0 11/02/2018 at Unknown time  . Prenatal Vit-Fe Fumarate-FA (PRENATAL MULTIVITAMIN) TABS tablet Take 1 tablet by mouth daily at 12 noon. 30 tablet 11 11/02/2018 at Unknown time  . ranitidine (ZANTAC) 150 MG tablet Take 1 tablet (150 mg total) by mouth 2 (two) times daily. 60 tablet 0 Taking     Review of Systems  Constitutional: Negative.   HENT: Negative.   Eyes: Positive for photophobia.  Respiratory: Negative.   Cardiovascular: Negative.   Gastrointestinal: Negative.   Endocrine: Negative.   Genitourinary: Negative.   Musculoskeletal: Negative.   Skin: Negative.   Allergic/Immunologic: Negative.   Neurological: Positive for headaches.  Hematological: Negative.   Psychiatric/Behavioral: Negative.    Physical Exam   Last menstrual period 01/26/2018, unknown if currently breastfeeding.  Physical Exam  Nursing note and vitals reviewed. Constitutional: She is oriented to person, place, and time. She appears well-developed and well-nourished.  HENT:  Head: Normocephalic and atraumatic.  Eyes: Pupils are equal, round, and reactive to light.  Neck: Normal range of motion.  Cardiovascular: Normal rate, regular rhythm and normal heart sounds.  Respiratory: Effort normal and breath sounds normal.  GI: Soft. Bowel sounds are normal.  Musculoskeletal: Normal range of motion.  Neurological: She is alert and oriented to person, place, and time.  Skin: Skin is warm and dry.  Psychiatric:  Patient still appears mildly postictal with some confusion and slow to answer questions    MAU Course  Procedures  MDM NST - FHR: 135 bpm / moderate variability / accels present / decels absent / TOCO: none   *Consult with Dr. Alysia PennaErvin @ 1945 - notified of patient's arrival by EMS and requests for him to come evaluate patient. Orders received to admit for IOL. -- Dr. Elita Quickat Wallace notified - will do H&P and enter orders. Assessment and Plan  Seizures (HCC) - Admit to L&D for IOL d/t seizure d/o and post-term pregnancy  - Drs. Ervin & Cat Earlene PlaterWallace assumes care of pt upon admission   Erin Moraolitta Dontavis Tschantz, MSN, CNM 11/07/2018, 7:55 PM

## 2018-11-07 NOTE — MAU Note (Signed)
Arrived via EMS, had seizure around 1815. Hx of seizure. Pt states she has a couple of seizures a month, on keppra  C/o of HA X 3days, denies dizziness or blurry vision. States Positive FM, denies bleeding or leaking of fluid.

## 2018-11-08 ENCOUNTER — Telehealth: Payer: Self-pay | Admitting: *Deleted

## 2018-11-08 ENCOUNTER — Inpatient Hospital Stay (HOSPITAL_COMMUNITY): Payer: Medicaid Other | Admitting: Anesthesiology

## 2018-11-08 ENCOUNTER — Encounter (HOSPITAL_COMMUNITY): Payer: Self-pay | Admitting: *Deleted

## 2018-11-08 DIAGNOSIS — Z3A40 40 weeks gestation of pregnancy: Secondary | ICD-10-CM

## 2018-11-08 DIAGNOSIS — O1413 Severe pre-eclampsia, third trimester: Secondary | ICD-10-CM

## 2018-11-08 DIAGNOSIS — O99354 Diseases of the nervous system complicating childbirth: Secondary | ICD-10-CM

## 2018-11-08 LAB — CBC
HCT: 35.3 % — ABNORMAL LOW (ref 36.0–46.0)
Hemoglobin: 11.5 g/dL — ABNORMAL LOW (ref 12.0–15.0)
MCH: 29.1 pg (ref 26.0–34.0)
MCHC: 32.6 g/dL (ref 30.0–36.0)
MCV: 89.4 fL (ref 80.0–100.0)
Platelets: 240 10*3/uL (ref 150–400)
RBC: 3.95 MIL/uL (ref 3.87–5.11)
RDW: 12.8 % (ref 11.5–15.5)
WBC: 14.5 10*3/uL — ABNORMAL HIGH (ref 4.0–10.5)
nRBC: 0 % (ref 0.0–0.2)

## 2018-11-08 LAB — RPR: RPR Ser Ql: NONREACTIVE

## 2018-11-08 LAB — ABO/RH: ABO/RH(D): O POS

## 2018-11-08 MED ORDER — LACTATED RINGERS IV SOLN: 500.0000 mL | Freq: Once | INTRAVENOUS | Status: DC

## 2018-11-08 MED ORDER — CHLORHEXIDINE GLUCONATE CLOTH 2 % EX PADS
6.0000 | MEDICATED_PAD | Freq: Every day | CUTANEOUS | Status: DC
  Administered 2018-11-08 – 2018-11-10 (×3): 6 via TOPICAL

## 2018-11-08 MED ORDER — PRENATAL MULTIVITAMIN CH
1.0000 | ORAL_TABLET | Freq: Every day | ORAL | Status: DC
  Administered 2018-11-08 – 2018-11-10 (×3): 1 via ORAL
  Filled 2018-11-08 (×3): qty 1

## 2018-11-08 MED ORDER — ONDANSETRON HCL 4 MG/2ML IJ SOLN: 4.0000 mg | INTRAMUSCULAR | Status: DC | PRN

## 2018-11-08 MED ORDER — MEASLES, MUMPS & RUBELLA VAC IJ SOLR
0.5000 mL | Freq: Once | INTRAMUSCULAR | Status: DC
  Filled 2018-11-08: qty 0.5

## 2018-11-08 MED ORDER — DIPHENHYDRAMINE HCL 25 MG PO CAPS: 25.0000 mg | ORAL_CAPSULE | Freq: Four times a day (QID) | ORAL | Status: DC | PRN

## 2018-11-08 MED ORDER — SIMETHICONE 80 MG PO CHEW: 80.0000 mg | CHEWABLE_TABLET | ORAL | Status: DC | PRN

## 2018-11-08 MED ORDER — LIDOCAINE HCL (PF) 1 % IJ SOLN
INTRAMUSCULAR | Status: DC | PRN
  Administered 2018-11-08: 5 mL via EPIDURAL

## 2018-11-08 MED ORDER — PHENYLEPHRINE 40 MCG/ML (10ML) SYRINGE FOR IV PUSH (FOR BLOOD PRESSURE SUPPORT)
80.0000 ug | PREFILLED_SYRINGE | INTRAVENOUS | Status: DC | PRN
  Filled 2018-11-08 (×2): qty 10

## 2018-11-08 MED ORDER — COCONUT OIL OIL: 1.0000 "application " | TOPICAL_OIL | Status: DC | PRN

## 2018-11-08 MED ORDER — MAGNESIUM SULFATE 40 G IN LACTATED RINGERS - SIMPLE
2.0000 g/h | INTRAVENOUS | Status: AC
  Administered 2018-11-09: 2 g/h via INTRAVENOUS
  Filled 2018-11-08: qty 500

## 2018-11-08 MED ORDER — PHENYLEPHRINE 40 MCG/ML (10ML) SYRINGE FOR IV PUSH (FOR BLOOD PRESSURE SUPPORT): 80.0000 ug | PREFILLED_SYRINGE | INTRAVENOUS | Status: DC | PRN

## 2018-11-08 MED ORDER — TETANUS-DIPHTH-ACELL PERTUSSIS 5-2.5-18.5 LF-MCG/0.5 IM SUSP: 0.5000 mL | Freq: Once | INTRAMUSCULAR | Status: DC

## 2018-11-08 MED ORDER — ONDANSETRON HCL 4 MG PO TABS: 4.0000 mg | ORAL_TABLET | ORAL | Status: DC | PRN

## 2018-11-08 MED ORDER — FENTANYL CITRATE (PF) 100 MCG/2ML IJ SOLN
100.0000 ug | INTRAMUSCULAR | Status: DC | PRN
  Administered 2018-11-08 (×2): 100 ug via INTRAVENOUS
  Filled 2018-11-08 (×2): qty 2

## 2018-11-08 MED ORDER — IBUPROFEN 600 MG PO TABS
600.0000 mg | ORAL_TABLET | Freq: Four times a day (QID) | ORAL | Status: DC
  Administered 2018-11-08 – 2018-11-10 (×9): 600 mg via ORAL
  Filled 2018-11-08 (×9): qty 1

## 2018-11-08 MED ORDER — EPHEDRINE 5 MG/ML INJ: 10.0000 mg | INTRAVENOUS | Status: DC | PRN

## 2018-11-08 MED ORDER — EPHEDRINE 5 MG/ML INJ
10.0000 mg | INTRAVENOUS | Status: DC | PRN
  Filled 2018-11-08: qty 2

## 2018-11-08 MED ORDER — MAGNESIUM SULFATE 40 G IN LACTATED RINGERS - SIMPLE
2.0000 g/h | INTRAVENOUS | Status: DC
  Filled 2018-11-08: qty 500

## 2018-11-08 MED ORDER — DIBUCAINE 1 % RE OINT: 1.0000 "application " | TOPICAL_OINTMENT | RECTAL | Status: DC | PRN

## 2018-11-08 MED ORDER — HYDRALAZINE HCL 20 MG/ML IJ SOLN: 5.0000 mg | INTRAMUSCULAR | Status: DC | PRN

## 2018-11-08 MED ORDER — HYDRALAZINE HCL 20 MG/ML IJ SOLN: 10.0000 mg | INTRAMUSCULAR | Status: DC | PRN

## 2018-11-08 MED ORDER — BENZOCAINE-MENTHOL 20-0.5 % EX AERO: 1.0000 "application " | INHALATION_SPRAY | CUTANEOUS | Status: DC | PRN

## 2018-11-08 MED ORDER — MAGNESIUM SULFATE 40 G IN LACTATED RINGERS - SIMPLE: 2.0000 g/h | INTRAVENOUS | Status: DC

## 2018-11-08 MED ORDER — NITROFURANTOIN MONOHYD MACRO 100 MG PO CAPS
100.0000 mg | ORAL_CAPSULE | Freq: Every day | ORAL | Status: DC
  Administered 2018-11-08 – 2018-11-09 (×2): 100 mg via ORAL
  Filled 2018-11-08 (×3): qty 1

## 2018-11-08 MED ORDER — FENTANYL 2.5 MCG/ML BUPIVACAINE 1/10 % EPIDURAL INFUSION (WH - ANES)
14.0000 mL/h | INTRAMUSCULAR | Status: DC | PRN
  Administered 2018-11-08: 14 mL/h via EPIDURAL
  Filled 2018-11-08: qty 100

## 2018-11-08 MED ORDER — SENNOSIDES-DOCUSATE SODIUM 8.6-50 MG PO TABS
2.0000 | ORAL_TABLET | ORAL | Status: DC
  Administered 2018-11-08 – 2018-11-10 (×2): 2 via ORAL
  Filled 2018-11-08 (×2): qty 2

## 2018-11-08 MED ORDER — LABETALOL HCL 5 MG/ML IV SOLN: 20.0000 mg | INTRAVENOUS | Status: DC | PRN

## 2018-11-08 MED ORDER — ACETAMINOPHEN 325 MG PO TABS
650.0000 mg | ORAL_TABLET | ORAL | Status: DC | PRN
  Administered 2018-11-08: 650 mg via ORAL
  Filled 2018-11-08: qty 2

## 2018-11-08 MED ORDER — PHENYLEPHRINE 40 MCG/ML (10ML) SYRINGE FOR IV PUSH (FOR BLOOD PRESSURE SUPPORT)
80.0000 ug | PREFILLED_SYRINGE | INTRAVENOUS | Status: DC | PRN
  Filled 2018-11-08: qty 10

## 2018-11-08 MED ORDER — WITCH HAZEL-GLYCERIN EX PADS: 1.0000 "application " | MEDICATED_PAD | CUTANEOUS | Status: DC | PRN

## 2018-11-08 MED ORDER — DIPHENHYDRAMINE HCL 50 MG/ML IJ SOLN: 12.5000 mg | INTRAMUSCULAR | Status: DC | PRN

## 2018-11-08 MED ORDER — ZOLPIDEM TARTRATE 5 MG PO TABS: 5.0000 mg | ORAL_TABLET | Freq: Every evening | ORAL | Status: DC | PRN

## 2018-11-08 MED ORDER — LACTATED RINGERS IV SOLN
500.0000 mL | Freq: Once | INTRAVENOUS | Status: AC
  Administered 2018-11-08: 500 mL via INTRAVENOUS

## 2018-11-08 MED ORDER — LABETALOL HCL 5 MG/ML IV SOLN: 40.0000 mg | INTRAVENOUS | Status: DC | PRN

## 2018-11-08 MED ORDER — MUPIROCIN 2 % EX OINT
1.0000 "application " | TOPICAL_OINTMENT | Freq: Two times a day (BID) | CUTANEOUS | Status: DC
  Administered 2018-11-08 – 2018-11-10 (×5): 1 via NASAL
  Filled 2018-11-08 (×2): qty 22

## 2018-11-08 MED ORDER — LACTATED RINGERS IV SOLN
INTRAVENOUS | Status: DC
  Administered 2018-11-08 (×2): via INTRAVENOUS

## 2018-11-08 MED ORDER — LEVETIRACETAM 500 MG PO TABS
1000.0000 mg | ORAL_TABLET | Freq: Two times a day (BID) | ORAL | Status: DC
  Administered 2018-11-08 – 2018-11-10 (×4): 1000 mg via ORAL
  Filled 2018-11-08 (×6): qty 2

## 2018-11-08 MED ORDER — MAGNESIUM SULFATE BOLUS VIA INFUSION
4.0000 g | Freq: Once | INTRAVENOUS | Status: AC
  Administered 2018-11-08: 4 g via INTRAVENOUS
  Filled 2018-11-08: qty 500

## 2018-11-08 NOTE — Anesthesia Procedure Notes (Signed)
Epidural Patient location during procedure: OB Start time: 11/08/2018 5:41 AM End time: 11/08/2018 5:47 AM  Staffing Anesthesiologist: Shelton SilvasHollis, Jafar Poffenberger D, MD Performed: anesthesiologist   Preanesthetic Checklist Completed: patient identified, site marked, surgical consent, pre-op evaluation, timeout performed, IV checked, risks and benefits discussed and monitors and equipment checked  Epidural Patient position: sitting Prep: ChloraPrep Patient monitoring: heart rate, continuous pulse ox and blood pressure Approach: midline Location: L3-L4 Injection technique: LOR saline  Needle:  Needle type: Tuohy  Needle gauge: 17 G Needle length: 9 cm Catheter type: closed end flexible Catheter size: 20 Guage Test dose: negative and 1.5% lidocaine  Assessment Events: blood not aspirated, injection not painful, no injection resistance and no paresthesia  Additional Notes LOR @ 5  Patient identified. Risks/Benefits/Options discussed with patient including but not limited to bleeding, infection, nerve damage, paralysis, failed block, incomplete pain control, headache, blood pressure changes, nausea, vomiting, reactions to medications, itching and postpartum back pain. Confirmed with bedside nurse the patient's most recent platelet count. Confirmed with patient that they are not currently taking any anticoagulation, have any bleeding history or any family history of bleeding disorders. Patient expressed understanding and wished to proceed. All questions were answered. Sterile technique was used throughout the entire procedure. Please see nursing notes for vital signs. Test dose was given through epidural catheter and negative prior to continuing to dose epidural or start infusion. Warning signs of high block given to the patient including shortness of breath, tingling/numbness in hands, complete motor block, or any concerning symptoms with instructions to call for help. Patient was given instructions  on fall risk and not to get out of bed. All questions and concerns addressed with instructions to call with any issues or inadequate analgesia.    Reason for block:procedure for pain

## 2018-11-08 NOTE — Telephone Encounter (Signed)
Left message on voice mail for pt to call us back to set up bp check and pp appointment.  11-08-18  AS

## 2018-11-08 NOTE — Lactation Note (Signed)
This note was copied from a baby's chart. Lactation Consultation Note  Patient Name: Erin [name] [id number]'s Date: 11/08/2018   Mom hx drug use, depression, smoker, vape user, first baby passed away, PTSD.  Grandparents holding infant.  FOB holding moms hand , and mom sleeping. Gave and reviewed handouts with FOB.  Urged him to call if when mom woke up she wanted assistance with breastfeeding.   Maternal Data    Feeding    LATCH Score                   Interventions    Lactation Tools Discussed/Used     Consult Status      Zonie Crutcher Michaelle CopasS Anamari Galeas 11/08/2018, 4:02 PM

## 2018-11-08 NOTE — Anesthesia Preprocedure Evaluation (Addendum)
Anesthesia Evaluation  Patient identified by MRN, date of birth, ID band Patient awake    Reviewed: Allergy & Precautions, Patient's Chart, lab work & pertinent test results  Airway Mallampati: II       Dental no notable dental hx.    Pulmonary asthma , Current Smoker,    Pulmonary exam normal        Cardiovascular negative cardio ROS Normal cardiovascular exam     Neuro/Psych  Headaches, Seizures -, Poorly Controlled,  Anxiety    GI/Hepatic negative GI ROS, Neg liver ROS,   Endo/Other  negative endocrine ROS  Renal/GU      Musculoskeletal negative musculoskeletal ROS (+)   Abdominal   Peds  Hematology negative hematology ROS (+)   Anesthesia Other Findings   Reproductive/Obstetrics negative OB ROS                           Anesthesia Physical Anesthesia Plan  ASA: II  Anesthesia Plan: Epidural   Post-op Pain Management:    Induction:   PONV Risk Score and Plan:   Airway Management Planned: Natural Airway  Additional Equipment: None  Intra-op Plan:   Post-operative Plan:   Informed Consent: I have reviewed the patients History and Physical, chart, labs and discussed the procedure including the risks, benefits and alternatives for the proposed anesthesia with the patient or authorized representative who has indicated his/her understanding and acceptance.     Plan Discussed with:   Anesthesia Plan Comments: (Lab Results      Component                Value               Date                      WBC                      14.5 (H)            11/08/2018                HGB                      11.5 (L)            11/08/2018                HCT                      35.3 (L)            11/08/2018                MCV                      89.4                11/08/2018                PLT                      240                 11/08/2018           )        Anesthesia Quick  Evaluation

## 2018-11-08 NOTE — Progress Notes (Signed)
LABOR PROGRESS NOTE  Erin BertholdWillow Krueger is a 22 y.o. G3P1010 at 390w6d  admitted for IOL for seizure disorder.   Subjective: Strip note. Reviewed plan of care with RN. Patient currently sleeping comfortably in room per RN.   Objective: BP 116/81   Pulse 78   Temp 98.2 F (36.8 C) (Oral)   Resp 16   Ht 5\' 6"  (1.676 m)   Wt 83.9 kg   LMP 01/26/2018   BMI 29.86 kg/m  or  Vitals:   11/08/18 0101 11/08/18 0130 11/08/18 0201 11/08/18 0230  BP: 136/82 121/79 128/79 116/81  Pulse: 74 78 86 78  Resp: 18 18 16 16   Temp:      TempSrc:      Weight:      Height:        Dilation: 2 Effacement (%): 50 Presentation: Vertex Exam by:: Dr. Aggie HackerWallace  FHT: baseline rate 120, moderate varibility, acel, no decel Toco: q2-4 min   Labs: Lab Results  Component Value Date   WBC 13.9 (H) 11/07/2018   HGB 11.8 (L) 11/07/2018   HCT 36.3 11/07/2018   MCV 90.1 11/07/2018   PLT 250 11/07/2018    Patient Active Problem List   Diagnosis Date Noted  . Seizures (HCC) 08/28/2018  . Third trimester pregnancy   . Persistent UTI (urinary tract infection) during pregnancy 04/08/2018  . Marijuana use 04/07/2018  . Supervision of normal pregnancy 04/05/2018  . Smoker 04/05/2018  . Death of child 04/05/2018  . 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 PID (acute pelvic inflammatory disease) 01/29/2016  . Seizure disorder (HCC) 01/07/2016  . Seizure (HCC) 01/07/2016  . 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 PTSD (post-traumatic stress disorder) 05/12/2013  . 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 Polysubstance abuse (HCC) 05/12/2013    Assessment / Plan: 22 y.o. G3P1010 at 10490w6d here for IOL for seizure disorder.   Labor: Induction. Latent labor. On pitocin 12 mu/min, continue to titrate as appropriate.  Fetal Wellbeing:  Cat I  Pain Control:  Plans for epidural  Anticipated MOD:  NSVD   Marcy Sirenatherine Wallace, D.O. OB Fellow  11/08/2018, 2:57 AM

## 2018-11-09 DIAGNOSIS — Z3A40 40 weeks gestation of pregnancy: Secondary | ICD-10-CM

## 2018-11-09 DIAGNOSIS — O99354 Diseases of the nervous system complicating childbirth: Secondary | ICD-10-CM

## 2018-11-09 DIAGNOSIS — O1413 Severe pre-eclampsia, third trimester: Secondary | ICD-10-CM

## 2018-11-09 MED ORDER — NICOTINE 7 MG/24HR TD PT24
7.0000 mg | MEDICATED_PATCH | Freq: Every day | TRANSDERMAL | Status: DC
  Administered 2018-11-09: 7 mg via TRANSDERMAL
  Filled 2018-11-09 (×3): qty 1

## 2018-11-09 NOTE — Anesthesia Postprocedure Evaluation (Signed)
Anesthesia Post Note  Patient: Erin BertholdWillow Johnson  Procedure(s) Performed: AN AD HOC LABOR EPIDURAL     Patient location during evaluation: Mother Baby Anesthesia Type: Epidural Level of consciousness: awake Pain management: satisfactory to patient Vital Signs Assessment: post-procedure vital signs reviewed and stable Respiratory status: spontaneous breathing Cardiovascular status: stable Anesthetic complications: no    Last Vitals:  Vitals:   11/09/18 0600 11/09/18 0743  BP:  104/70  Pulse:  69  Resp: 18 16  Temp:  36.9 C  SpO2:  98%    Last Pain:  Vitals:   11/09/18 0745  TempSrc:   PainSc: 0-No pain   Pain Goal: Patients Stated Pain Goal: 2 (11/09/18 0745)               Cephus ShellingBURGER,Sarah Zerby

## 2018-11-09 NOTE — Progress Notes (Signed)
Daily Postpartum Note  Admission Date: 11/07/2018 Current Date: 11/09/2018 12:00 PM  Erin Krueger is a 22 y.o. [id number] PPD#1 SVD/intact perineum, admitted for seizure disorder. Pt dx with severe pre-eclampsia on admission  Pregnancy complicated by: Patient Active Problem List   Diagnosis Date Noted  . Seizures ([place]) 08/28/2018  . Third trimester pregnancy   . Persistent UTI (urinary tract infection) during pregnancy 04/08/2018  . Marijuana use 04/07/2018  . Supervision of normal pregnancy 04/30/18  . Smoker 04/30/2018  . Death of child 2018-04-30  . 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 PID (acute pelvic inflammatory disease) 01/29/2016  . Seizure disorder ([place]) 01/07/2016  . Seizure ([place]) 01/07/2016  . 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 PTSD (post-traumatic stress disorder) 05/12/2013  . 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 Polysubstance abuse ([place]) 05/12/2013    Overnight/24hr events:  Patient came off PP Mg earlier this morning  Subjective:  No s/s of pre-eclampsia. Meeting all pp goals. Pt with some upper back discomfort above the epidural site.   Objective:    Current Vital Signs 24h Vital Sign Ranges  T 98.6 F (37 C) Temp  Avg: 98.5 F (36.9 C)  Min: 98 F (36.7 C)  Max: 98.8 F (37.1 C)  BP (!) 137/98 BP  Min: 103/67  Max: 138/90  HR 90 Pulse  Avg: 78.1  Min: 67  Max: 91  RR 18 Resp  Avg: 18.2  Min: 16  Max: 21  SaO2 97 % Room Air SpO2  Avg: 98.3 %  Min: 97 %  Max: 100 %       24 Hour I/O Current Shift I/O  Time Ins Outs 12/17 0701 - 12/18 0700 In: 3046.9 [P.O.:2362; I.V.:684.9] Out: 4342 [Urine:4200] 12/18 0701 - 12/18 1900 In: -  Out: 1000 [Urine:1000]   Patient Vitals for the past 24 hrs:  BP Temp Temp src Pulse Resp SpO2  11/09/18 1141 (!) 137/98 98.6 F (37 C) Oral 90 18 97 %  11/09/18 0743 104/70 98.5 F (36.9 C) Oral 69 16 98 %  11/09/18 0600 - - - - 18 -  11/09/18 0500 - - - - 16 -   11/09/18 0344 138/90 98.6 F (37 C) Oral 74 19 100 %  11/09/18 0300 - - - - 18 -  11/09/18 0157 - - - - 18 -  11/09/18 0100 - - - - 18 -  11/08/18 2307 132/83 98.7 F (37.1 C) Oral 78 19 98 %  11/08/18 2200 - - - - 18 -  11/08/18 2100 - - - - 20 -  11/08/18 2047 (!) 123/92 98.4 F (36.9 C) Oral 78 (!) 21 99 %  11/08/18 1619 115/74 98.8 F (37.1 C) Oral 91 18 99 %  11/08/18 1237 103/67 98 F (36.7 C) Oral 67 18 97 %    Physical exam: General: Well nourished, well developed female in no acute distress. Abdomen: firm fundus below the umbilicus Back: normal, nttp, epidural site w/o e/o infection.  Cardiovascular: S1, S2 normal, no murmur, rub or gallop, regular rate and rhythm Respiratory: CTAB Extremities: no clubbing, cyanosis or edema Skin: Warm and dry.   Medications: Current Facility-Administered Medications  Medication Dose Route Frequency Provider Last Rate Last Dose  . acetaminophen (TYLENOL) tablet 650 mg  650 mg Oral Q4H PRN Nicolette Bang, DO   650 mg at 11/08/18 1319  . benzocaine-Menthol (DERMOPLAST) 20-0.5 % topical spray 1 application  1 application Topical PRN Nicolette Bang, DO      .  budesonide (PULMICORT) nebulizer solution 0.25 mg  0.25 mg Nebulization BID Nicolette Bang, DO   0.25 mg at 11/09/18 3734  . Chlorhexidine Gluconate Cloth 2 % PADS 6 each  6 each Topical Q0600 Nicolette Bang, DO   6 each at 11/09/18 940 028 3851  . coconut oil  1 application Topical PRN Nicolette Bang, DO      . witch hazel-glycerin (TUCKS) pad 1 application  1 application Topical PRN Nicolette Bang, DO       And  . dibucaine (NUPERCAINAL) 1 % rectal ointment 1 application  1 application Rectal PRN Nicolette Bang, DO      . diphenhydrAMINE (BENADRYL) capsule 25 mg  25 mg Oral Q6H PRN Nicolette Bang, DO      . ibuprofen (ADVIL,MOTRIN) tablet 600 mg  600 mg Oral Q6H Nicolette Bang, DO   600 mg at  11/09/18 0610  . lactated ringers infusion   Intravenous Continuous Aletha Halim, MD 100 mL/hr at 11/09/18 0400    . levETIRAcetam (KEPPRA) tablet 1,000 mg  1,000 mg Oral BID Juleen China, Laurel S, DO   1,000 mg at 11/09/18 1038  . measles, mumps & rubella vaccine (MMR) injection 0.5 mL  0.5 mL Subcutaneous Once Nicolette Bang, DO      . mupirocin ointment (BACTROBAN) 2 % 1 application  1 application Nasal BID Nicolette Bang, DO   1 application at 81/15/72 1039  . nitrofurantoin (macrocrystal-monohydrate) (MACROBID) capsule 100 mg  100 mg Oral QHS Aletha Halim, MD   100 mg at 11/08/18 2220  . ondansetron (ZOFRAN) tablet 4 mg  4 mg Oral Q4H PRN Nicolette Bang, DO       Or  . ondansetron [facility]) injection 4 mg  4 mg Intravenous Q4H PRN Nicolette Bang, DO      . prenatal multivitamin tablet 1 tablet  1 tablet Oral Q1200 Nicolette Bang, DO   1 tablet at 11/08/18 1220  . senna-docusate (Senokot-S) tablet 2 tablet  2 tablet Oral Q24H Nicolette Bang, DO   2 tablet at 11/08/18 2325  . simethicone (MYLICON) chewable tablet 80 mg  80 mg Oral PRN Nicolette Bang, DO      . Tdap (BOOSTRIX) injection 0.5 mL  0.5 mL Intramuscular Once Nicolette Bang, DO        Labs:  Recent Labs  Lab 11/03/18 0348 11/07/18 2030 11/08/18 0508  WBC 13.8* 13.9* 14.5*  HGB 11.2* 11.8* 11.5*  HCT 34.6* 36.3 35.3*  PLT 212 250 240    Recent Labs  Lab 11/03/18 0348 11/07/18 2030  NA 138 136  K 4.0 3.8  CL 108 110  CO2 22 19*  BUN 9 7  CREATININE 0.56 0.46  CALCIUM 8.6* 8.3*  PROT 5.9* 5.7*  BILITOT 0.5 0.5  ALKPHOS 107 118  ALT 8 9  AST 12* 11*  GLUCOSE 85 88     Radiology: no new imaging  Assessment & Plan:  Pt doing well *PP: routine care. Depo provera *Neuro: pt has f/u with her neurologist in [place] on Friday. Pt told to keep that appointment. She states she'd like to transfer to closer neurologist. I told  her to keep her current one and appt and d/w [place] at her post partum visit *Severe pre-eclampsia: doing well on no meds *social: SW consulted.  *Dispo: likely tomorrow  Durene Romans. MD Attending Center for [facility])

## 2018-11-10 ENCOUNTER — Other Ambulatory Visit: Payer: Self-pay

## 2018-11-10 DIAGNOSIS — O1413 Severe pre-eclampsia, third trimester: Secondary | ICD-10-CM

## 2018-11-10 DIAGNOSIS — O99354 Diseases of the nervous system complicating childbirth: Secondary | ICD-10-CM

## 2018-11-10 DIAGNOSIS — Z3A40 40 weeks gestation of pregnancy: Secondary | ICD-10-CM

## 2018-11-10 MED ORDER — MUPIROCIN 2 % EX OINT: 1.0000 "application " | TOPICAL_OINTMENT | Freq: Two times a day (BID) | CUTANEOUS | 0 refills | Status: AC

## 2018-11-10 MED ORDER — NITROFURANTOIN MONOHYD MACRO 100 MG PO CAPS: 100.0000 mg | ORAL_CAPSULE | Freq: Every day | ORAL | 0 refills | Status: DC

## 2018-11-10 MED ORDER — IBUPROFEN 600 MG PO TABS: 600.0000 mg | ORAL_TABLET | Freq: Four times a day (QID) | ORAL | 0 refills | Status: DC | PRN

## 2018-11-10 NOTE — Progress Notes (Signed)
Patient stepping off unit.

## 2018-11-10 NOTE — Discharge Summary (Signed)
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 Intact: yes. To pathology: no.  Laceration: none Episiotomy: none EBL: 195mL Baby: Liveborn female, APGARs 8/9, weight 3275 g.    Discharge Diagnosis: Delivered.   Postpartum course: uncomplicated. Patient had 24 hours of PP Mg and had no seizures PP or BP issues.   Discharge Vital Signs:  Current Vital Signs 24h Vital Sign Ranges  T 98.4 F (36.9 C) Temp  Avg: 98.6 F (37 C)  Min: 98.4 F (36.9 C)  Max: 98.8 F (37.1 C)  BP 109/66 BP  Min: 109/66  Max: 139/91  HR (!) 58 Pulse  Avg: 70.3  Min: 58  Max: 90  RR 16 Resp  Avg: 18.2  Min: 16  Max: 20  SaO2 98 % Room Air SpO2  Avg: 98 %  Min: 97 %  Max: 99 %       24 Hour I/O Current Shift I/O  Time Ins Outs 12/18 0701 - 12/19 0700 In: 360 [P.O.:360] Out: 1700 [Urine:1700] No intake/output data recorded.    Discharge Exam:  NAD Perineum: defered Abdomen: firm fundus below the umbilicus, NTTP, non distended, +bowel sounds.  RRR no MRGs CTAB Ext: no c/c/e  Recent Labs  Lab 11/07/18 2030 11/08/18 0508  WBC 13.9* 14.5*  HGB 11.8* 11.5*  HCT 36.3 35.3*  PLT 250 240    Disposition: Home  Rh Immune globulin given: not applicable Rubella vaccine given: not applicable Tdap vaccine given in AP or PP setting: yes Flu vaccine given in AP or PP setting: yes  Contraception: Depo-Provera  Prenatal/Postnatal Panel: O pos//Rubella Immune//Varicella Unknown//RPR  negative//HIV negative/HepB Surface Ag negative//pap no abnormalities (date: 2019)//plans to bottle feed  Plan:  Erin Krueger was discharged to home in good condition.  Follow-up appointment with FT in 1 week for a BP check visit. Will continue on qhs macrobid for a month given her history of recurrent UTIs.  Patient still MRSA so Rx for bactroban nasally given  Keppra level pending. Patient has appointment with her neurologist in [place] on 12/20 and pt told to keep that appointment. She desires one closer to home and I told her to d/w her neurologist and FT providers about referring her to a closer one  Future Appointments  Date Time Provider Department Center  11/14/2018  4:00 PM [facility] NURSE TECH [facility]  12/13/2018  4:00 PM Cheral MarkerBooker, Kimberly R, CNM [facility]    Discharge Medications: Allergies as of 11/10/2018      Reactions   Bee Venom Anaphylaxis   Cashew Nut Oil Anaphylaxis   Penicillins Anaphylaxis   Has patient had a PCN reaction causing immediate rash, facial/tongue/throat swelling, SOB or lightheadedness with hypotension: Unknown Has patient had a PCN reaction causing severe rash involving mucus membranes or skin necrosis: Unknown Has patient had a PCN reaction that required hospitalization: Unknown Has patient had a PCN reaction occurring within the last 10 years: Unknown If all of the above answers are "NO", then may proceed with Cephalosporin use.   Prednisone Other (See Comments)   Bleeding nose bleeding and internal bleeding   Cocoa  Butter Rash   Latex Rash   Shellfish Allergy Hives, Rash      Medication List    TAKE these medications   acetaminophen 500 MG tablet Commonly known as:  TYLENOL Take 500 mg by mouth every 6 (six) hours as needed for mild pain, moderate pain, fever or headache.   albuterol 108 (90 Base) MCG/ACT inhaler Commonly known as:  PROVENTIL HFA;VENTOLIN HFA Inhale 2 puffs into the lungs every 6 (six) hours as  needed for wheezing or shortness of breath.   EPINEPHrine 0.3 mg/0.3 mL Soaj injection Commonly known as:  EPI-PEN Inject 0.3 mLs (0.3 mg total) into the muscle as needed.   folic acid 1 MG tablet Commonly known as:  FOLVITE Take 1 mg by mouth daily.   ibuprofen 600 MG tablet Commonly known as:  ADVIL,MOTRIN Take 1 tablet (600 mg total) by mouth every 6 (six) hours as needed.   levETIRAcetam 500 MG tablet Commonly known as:  KEPPRA Take 2 tablets (1,000 mg total) by mouth 2 (two) times daily.   mupirocin ointment 2 % Commonly known as:  BACTROBAN Place 1 application into the nose 2 (two) times daily for 3 days.   nitrofurantoin (macrocrystal-monohydrate) 100 MG capsule Commonly known as:  MACROBID Take 1 capsule (100 mg total) by mouth at bedtime.   prenatal multivitamin Tabs tablet Take 1 tablet by mouth daily at 12 noon.       [name], [name] MD Attending Center for [facility](Faculty Practice)

## 2018-11-10 NOTE — Progress Notes (Signed)
CSW received a telephone call from [facility] C[facility] worker, Y. Kathreen Cosierinsley [phone]. [phone]).  CPS informed CSW that CPS is in the process of filing a petition for custody of infant. CPS agreed to fax over the non secure custody order and a letter on letterhead with foster parent's demographic. CSW made CPS aware the foster parents will need to bring a car seat and a government issued ID. CPS agreed to provide CSW an estimated time when foster parents will arrive.  CSW updated medical team and infant was transferred to the nursery.   A full clinical assessment was completed and documentation will enter at a later time.   Blaine HamperAngel Boyd-Gilyard, MSW, LCSW Clinical Social Work [phone]

## 2018-11-10 NOTE — Clinical Social Work Maternal (Addendum)
CLINICAL SOCIAL WORK MATERNAL/CHILD NOTE  Patient Details  Name: Erin Krueger MRN: [id number] Date of Birth: 12/13/1995  Date:  2018-01-02  Clinical Social Worker Initiating Note:  Laurey Arrow Date/Time: Initiated:  11/10/18/0915     Child's Name:  Erin Krueger   Biological Parents:  Mother(FOB is is Pincus Badder 01/10/1999)   Need for Interpreter:  None   Reason for Referral:  Behavioral Health Concerns, Current Substance Use/Substance Use During Pregnancy , Current CPS Involvement   Address:  [address] [place] [address]    Phone number:  [phone] (home)     Additional phone number:   Household Members/Support Persons (HM/SP):   (MOB reported that MOB lives with 2 roommates )   HM/SP Name Relationship DOB or Age  HM/SP -1        HM/SP -2        HM/SP -3        HM/SP -4        HM/SP -5        HM/SP -6        HM/SP -7        HM/SP -8          Natural Supports (not living in the home):  Parent, Friends, Immediate Family, Chief Executive Officer Supports: Therapist(MOB reported that MOB is an established patient with [facility])   Employment: Unemployed   Type of Work:     Education:  Programmer, systems   Homebound arranged:    Museum/gallery curator Resources:  Medicaid   Other Resources:  Theatre stage manager Considerations Which May Impact Care:    Strengths:  Ability to meet basic needs , Understanding of illness   Psychotropic Medications:         Pediatrician:       Pediatrician List:   [facility]      Pediatrician Fax Number:    Risk Factors/Current Problems:  Mental Health Concerns , Substance Use , Transportation , DHHS Involvement    Cognitive State:  Able to Concentrate , Alert , Linear Thinking    Mood/Affect:  Calm , Comfortable , Interested , Relaxed    CSW Assessment: CSW met with MOB in  room 320 to complete an assessment for MH hx, SA hx, CPS hx. When CSW arrived MOB and MOB's boyfriend Erin Krueger 02/09/2000) was laying in be bed and infant was asleep in bassinet.  CSW introduced herself and explained CSW's role. MOB gave CSW permission to complete the assessment while MOB's boyfriend was present.   CSW informed MOB that CSW was advised by [facility] CPS to take infant to nursery.  CSW explained to MOB that CPS will contact MOB to provide an explanation.   MOB was polite, forthcoming, and was receptive to meeting to [place].  MOB reported having all essential items for infant and feeling prepared to parent.  MOB also shared that MOB has a great support team that consists of MOB's boyfriend, boyfriends family, MOB's dad, and MOB's roommates.   CSW asked about MOB's CPS hx and MOB shared that MOB's oldest child is currently if foster cared.  MOB admitted to not working her plan with CPS and attributed her inconsistencies to transportation barriers and her intense seizures. CSW encouraged MOB to connect with Medicaid Transportation and also to apply for SCAT services; application  information was provided.   CSW asked about MOB's MH and MOB denied all dx with the exception of anxiety/depression.  Per MOB, MOB is ian established patient with [facility] and has been without symtoms for about 2 years.  MOB did not present with any acute MH symptoms and denied SI, HI, and DV when CSW assessed for safety.   CSW provided education regarding the baby blues period vs. perinatal mood disorders, discussed treatment and gave resources for mental health follow up if concerns arise.  CSW recommends self-evaluation during the postpartum time period using the New Mom Checklist from Postpartum Progress and encouraged MOB to contact a medical professional if symptoms are noted at any time.    CSW also inquired about MOB's SA hx.  MOB acknowledged the use of marijuana prior to MOB's positive UPT.   MOB stated, " Once I found out I was pregnant I immediately stopped smoking weed because I didn't want my baby to be born addicted like I was."  MOB shared her story of MOB's mother's addictions and how MOB's mother used crack/cocaine while pregnant with.  CSW offered MOB resources for substance abuse counseling and MOB declined. CSW made MOB aware of hospitals SA policy and informed MOB of infant's negative UDS.  CSW will continue to monitor infant's CDS and will provide CPS the results.    CSW Plan/Description:  Perinatal Mood and Anxiety Disorder (PMADs) Education, Other Patient/Family Education, [place], Other Information/Referral to [employer], Child Protective Service Report , CSW Will Continue to Monitor Umbilical Cord Tissue Drug Screen Results and Make Report if Warranted   Laurey Arrow, MSW, LCSW Clinical Social Work [phone]  Dimple Nanas, LCSW 11/10/2018, 10:14 AM

## 2018-11-10 NOTE — Progress Notes (Signed)
Patient left the unit and after several hours I called her on her cell phone.  She stated she left.  I discussed her discharge instructions with her.  I told her to refer to my-chart for her appointments.  Her speech seemed to be slurred, and she was unable to repeat back to me her appointment time. I instructed her to refer to her my-chart.

## 2018-11-10 NOTE — Lactation Note (Signed)
This note was copied from a baby's chart. Lactation Consultation Note  Patient Name: Erin [name] ZOXWR'UToday's Date: 11/10/2018 Reason for consult: Follow-up assessment;Term  P2 mother whose infant is now 40+6 weeks.  Mother is breast/bottle feeding.  Mother with a significant medical/social  history (see chart for details)  Mother was holding baby when I arrived.  She stated that the baby will not latch and she had to give formula.  While speaking with her she was not able to maintain a conversation without being distracted.  She could not elaborate on details and had a hard time focusing.  She did not answer appropriately at times.  Mother stated that she had to pump and bottle feed her first baby who is one year old.  Per mom, she did not get any lactation help and she stated [facility]  (This is interesting because [facility])  She stated she asked for help last night but "no one came."  I encouraged her to call me for latch assistance prior to discharge if she would like help.  She has a DEBP for home use and has no questions/concerns related to breast feeding.  Mother did not seem very interested in continuing to breast feed after discharge.  Baby also had a pacifier and I informed her about pacifier use and breast feeding.  She has our OP phone number for questions after discharge. Father present.  After speaking with her I came out to the desk to chart my visit (as written above) and to talk with her RN.  As I was doing this, her baby was being removed from the room and I discovered that she will not be taking her baby home with her.     Maternal Data Formula Feeding for Exclusion: No Has patient been taught Hand Expression?: Yes Does the patient have breastfeeding experience prior to this delivery?: No  Feeding Feeding Type: Formula  LATCH Score                   Interventions    Lactation Tools Discussed/Used      Consult Status Consult Status: Complete Date: 11/10/18 Follow-up type: Call as needed    Jerret Mcbane R Catilyn Boggus 11/10/2018, 9:12 AM

## 2018-11-10 NOTE — Discharge Instructions (Signed)
Preeclampsia and Eclampsia  Preeclampsia is a serious condition that may develop during pregnancy. It is also called toxemia of pregnancy. This condition causes high blood pressure along with other symptoms, such as swelling and headaches. These symptoms may develop as the condition gets worse. Preeclampsia may occur at 20 weeks of pregnancy or later. Diagnosing and treating preeclampsia early is very important. If not treated early, it can cause serious problems for you and your baby. One problem it can lead to is eclampsia. Eclampsia is a condition that causes muscle jerking or shaking (convulsions or seizures) and other serious problems for the mother. During pregnancy, delivering your baby may be the best treatment for preeclampsia or eclampsia. For most women, preeclampsia and eclampsia symptoms go away after giving birth. In rare cases, a woman may develop preeclampsia after giving birth (postpartum preeclampsia). This usually occurs within 48 hours after childbirth but may occur up to 6 weeks after giving birth. What are the causes? The cause of preeclampsia is not known. What are the signs or symptoms? The earliest signs of preeclampsia are:  High blood pressure.  Increased protein in your urine. Your health care provider will check for this at every visit before you give birth (prenatal visit). Other symptoms that may develop as the condition gets worse include:  Severe headaches.  Sudden weight gain.  Swelling of the hands, face, legs, and feet.  Nausea and vomiting.  Vision problems, such as blurred or double vision.  Numbness in the face, arms, legs, and feet.  Urinating less than usual.  Dizziness.  Slurred speech.  Abdominal pain, especially upper abdominal pain.  Convulsions or seizures. How is this diagnosed? There are no screening tests for preeclampsia. Your health care provider will ask you about symptoms and check for signs of preeclampsia during your prenatal  visits. You may also have tests that include:  Urine tests.  Blood tests.  Checking your blood pressure.  Monitoring your babys heart rate.  Ultrasound. How is this treated? You and your health care provider will determine the treatment approach that is best for you. Treatment may include:  Having more frequent prenatal exams to check for signs of preeclampsia, if you have an increased risk for preeclampsia.  Medicine to lower your blood pressure.  Staying in the hospital, if your condition is severe. There, treatment will focus on controlling your blood pressure and the amount of fluids in your body (fluid retention).  Taking medicine (magnesium sulfate) to prevent seizures. This may be given as an injection or through an IV.  Taking a low-dose aspirin during your pregnancy.  Delivering your baby early, if your condition gets worse. You may have your labor started with medicine (induced), or you may have a cesarean delivery. Follow these instructions at home: Eating and drinking   Drink enough fluid to keep your urine pale yellow.  Avoid caffeine. Lifestyle  Do not use any products that contain nicotine or tobacco, such as cigarettes and e-cigarettes. If you need help quitting, ask your health care provider.  Do not use alcohol or drugs.  Avoid stress as much as possible. Rest and get plenty of sleep. General instructions  Take over-the-counter and prescription medicines only as told by your health care provider.  When lying down, lie on your left side. This keeps pressure off your major blood vessels.  When sitting or lying down, raise (elevate) your feet. Try putting some pillows underneath your lower legs.  Exercise regularly. Ask your health care provider what  kinds of exercise are best for you.  Keep all follow-up and prenatal visits as told by your health care provider. This is important. How is this prevented? There is no known way of preventing  preeclampsia or eclampsia from developing. However, to lower your risk of complications and detect problems early:  Get regular prenatal care. Your health care provider may be able to diagnose and treat the condition early.  Maintain a healthy weight. Ask your health care provider for help managing weight gain during pregnancy.  Work with your health care provider to manage any long-term (chronic) health conditions you have, such as diabetes or kidney problems.  You may have tests of your blood pressure and kidney function after giving birth.  Your health care provider may have you take low-dose aspirin during your next pregnancy. Contact a health care provider if:  You have symptoms that your health care provider told you may require more treatment or monitoring, such as: ? Headaches. ? Nausea or vomiting. ? Abdominal pain. ? Dizziness. ? Light-headedness. Get help right away if:  You have severe: ? Abdominal pain. ? Headaches that do not get better. ? Dizziness. ? Vision problems. ? Confusion. ? Nausea or vomiting.  You have any of the following: ? A seizure. ? Sudden, rapid weight gain. ? Sudden swelling in your hands, ankles, or face. ? Trouble moving any part of your body. ? Numbness in any part of your body. ? Trouble speaking. ? Abnormal bleeding.  You faint. Summary  Preeclampsia is a serious condition that may develop during pregnancy. It is also called toxemia of pregnancy.  This condition causes high blood pressure along with other symptoms, such as swelling and headaches.  Diagnosing and treating preeclampsia early is very important. If not treated early, it can cause serious problems for you and your baby.  Get help right away if you have symptoms that your health care provider told you to watch for. This information is not intended to replace advice given to you by your health care provider. Make sure you discuss any questions you have with your health  care provider. Document Released: 11/06/2000 Document Revised: 10/26/2017 Document Reviewed: 06/15/2016 Elsevier Interactive Patient Education  2019 [employer]

## 2018-11-11 ENCOUNTER — Telehealth: Payer: Self-pay | Admitting: *Deleted

## 2018-11-11 ENCOUNTER — Encounter: Payer: Medicaid Other | Admitting: Obstetrics & Gynecology

## 2018-11-11 ENCOUNTER — Encounter (HOSPITAL_COMMUNITY): Payer: Self-pay | Admitting: Obstetrics and Gynecology

## 2018-11-11 LAB — LEVETIRACETAM LEVEL: Levetiracetam Lvl: <1 ug/mL — ABNORMAL LOW (ref 10.0–40.0)

## 2018-11-11 NOTE — Telephone Encounter (Signed)
Left message for pt to return call @ 11:54 am. [facility]

## 2018-11-11 NOTE — Telephone Encounter (Signed)
-----   Message from Cheral MarkerKimberly R Booker, [place] sent at 11/11/2018 11:25 AM EST ----- Vivianne SpenceHey Lexus Shampine, please call pt and find out who her neurologist is in [place] so results can be faxed. Thanks ----- Message ----- From: Colen DarlingYoung, Digby Groeneveld S, LPN Sent: [id number]/20/2019  11:06 AM EST To: Cheral MarkerKimberly R Booker, CNM   ----- Message ----- From: [place] BingPickens, Charlie, MD Sent: 11/11/2018  10:38 AM EST To: [facility]  Can you let her know and find out who her neurologist is in [place] so the results can be faxed? thanks

## 2018-11-18 NOTE — Telephone Encounter (Signed)
Left message @ 11:01 am. Pt has also had BP check appts that she is missing. JSY

## 2018-11-21 NOTE — Telephone Encounter (Signed)
Left message @ 10:29 am. Multiple attempts to reach pt. JSY

## 2018-11-29 ENCOUNTER — Encounter (HOSPITAL_COMMUNITY): Payer: Self-pay | Admitting: Emergency Medicine

## 2018-11-29 ENCOUNTER — Other Ambulatory Visit: Payer: Self-pay

## 2018-11-29 ENCOUNTER — Observation Stay (HOSPITAL_COMMUNITY)
Admission: EM | Admit: 2018-11-29 | Discharge: 2018-12-01 | Disposition: A | Discharge status: Home / Self Care | Payer: Medicaid Other | Attending: Family Medicine | Admitting: Family Medicine

## 2018-11-29 ENCOUNTER — Emergency Department (HOSPITAL_COMMUNITY): Payer: Medicaid Other

## 2018-11-29 DIAGNOSIS — I959 Hypotension, unspecified: Secondary | ICD-10-CM | POA: Diagnosis not present

## 2018-11-29 DIAGNOSIS — Z91148 Patient's other noncompliance with medication regimen for other reason: Secondary | ICD-10-CM

## 2018-11-29 DIAGNOSIS — G40901 Epilepsy, unspecified, not intractable, with status epilepticus: Principal | ICD-10-CM | POA: Diagnosis present

## 2018-11-29 DIAGNOSIS — Z9119 Patient's noncompliance with other medical treatment and regimen: Secondary | ICD-10-CM | POA: Insufficient documentation

## 2018-11-29 DIAGNOSIS — R109 Unspecified abdominal pain: Secondary | ICD-10-CM

## 2018-11-29 DIAGNOSIS — R569 Unspecified convulsions: Secondary | ICD-10-CM | POA: Diagnosis present

## 2018-11-29 DIAGNOSIS — Z9114 Patient's other noncompliance with medication regimen: Secondary | ICD-10-CM

## 2018-11-29 DIAGNOSIS — F17290 Nicotine dependence, other tobacco product, uncomplicated: Secondary | ICD-10-CM | POA: Insufficient documentation

## 2018-11-29 DIAGNOSIS — J45909 Unspecified asthma, uncomplicated: Secondary | ICD-10-CM | POA: Diagnosis not present

## 2018-11-29 DIAGNOSIS — G40919 Epilepsy, unspecified, intractable, without status epilepticus: Secondary | ICD-10-CM

## 2018-11-29 LAB — URINALYSIS, ROUTINE W REFLEX MICROSCOPIC
Bilirubin Urine: NEGATIVE
Glucose, UA: NEGATIVE mg/dL
Ketones, ur: 5 mg/dL — AB
Nitrite: NEGATIVE
Protein, ur: NEGATIVE mg/dL
Specific Gravity, Urine: 1.015 (ref 1.005–1.030)
pH: 6 (ref 5.0–8.0)

## 2018-11-29 LAB — MAGNESIUM: Magnesium: 2.4 mg/dL (ref 1.7–2.4)

## 2018-11-29 LAB — CBC
HCT: 40.6 % (ref 36.0–46.0)
Hemoglobin: 12.8 g/dL (ref 12.0–15.0)
MCH: 28.9 pg (ref 26.0–34.0)
MCHC: 31.5 g/dL (ref 30.0–36.0)
MCV: 91.6 fL (ref 80.0–100.0)
Platelets: 223 10*3/uL (ref 150–400)
RBC: 4.43 MIL/uL (ref 3.87–5.11)
RDW: 12.5 % (ref 11.5–15.5)
WBC: 6.3 10*3/uL (ref 4.0–10.5)
nRBC: 0 % (ref 0.0–0.2)

## 2018-11-29 LAB — COMPREHENSIVE METABOLIC PANEL
ALT: 15 U/L (ref 0–44)
AST: 13 U/L — ABNORMAL LOW (ref 15–41)
Albumin: 3.3 g/dL — ABNORMAL LOW (ref 3.5–5.0)
Alkaline Phosphatase: 75 U/L (ref 38–126)
Anion gap: 10 (ref 5–15)
BUN: 9 mg/dL (ref 6–20)
CO2: 20 mmol/L — ABNORMAL LOW (ref 22–32)
Calcium: 8.4 mg/dL — ABNORMAL LOW (ref 8.9–10.3)
Chloride: 111 mmol/L (ref 98–111)
Creatinine, Ser: 0.6 mg/dL (ref 0.44–1.00)
GFR calc Af Amer: >60 mL/min (ref >60)
GFR calc non Af Amer: >60 mL/min (ref >60)
Glucose, Bld: 87 mg/dL (ref 70–99)
Potassium: 3.6 mmol/L (ref 3.5–5.1)
Sodium: 141 mmol/L (ref 135–145)
Total Bilirubin: 0.4 mg/dL (ref 0.3–1.2)
Total Protein: 6.2 g/dL — ABNORMAL LOW (ref 6.5–8.1)

## 2018-11-29 LAB — I-STAT TROPONIN, ED: Troponin i, poc: 0 ng/mL (ref 0.00–0.08)

## 2018-11-29 LAB — ACETAMINOPHEN LEVEL: Acetaminophen (Tylenol), Serum: <10 ug/mL — ABNORMAL LOW (ref 10–30)

## 2018-11-29 LAB — ETHANOL: Alcohol, Ethyl (B): <10 mg/dL (ref <10)

## 2018-11-29 LAB — I-STAT BETA HCG BLOOD, ED (MC, WL, AP ONLY)
I-stat hCG, quantitative: <5 m[IU]/mL (ref <5)
I-stat hCG, quantitative: <5 m[IU]/mL (ref <5)

## 2018-11-29 LAB — LIPASE, BLOOD: Lipase: 31 U/L (ref 11–51)

## 2018-11-29 LAB — PHOSPHORUS: Phosphorus: 2.7 mg/dL (ref 2.5–4.6)

## 2018-11-29 LAB — SALICYLATE LEVEL: Salicylate Lvl: <7 mg/dL (ref 2.8–30.0)

## 2018-11-29 LAB — I-STAT CG4 LACTIC ACID, ED: Lactic Acid, Venous: 1.59 mmol/L (ref 0.5–1.9)

## 2018-11-29 IMAGING — CR DG CHEST 1V PORT
1 series · 1 of 1 positions shown · non-contrast
Comparison: [DATE]

CLINICAL DATA: Seizure and hypotension

EXAM:
PORTABLE CHEST 1 VIEW

[portable]
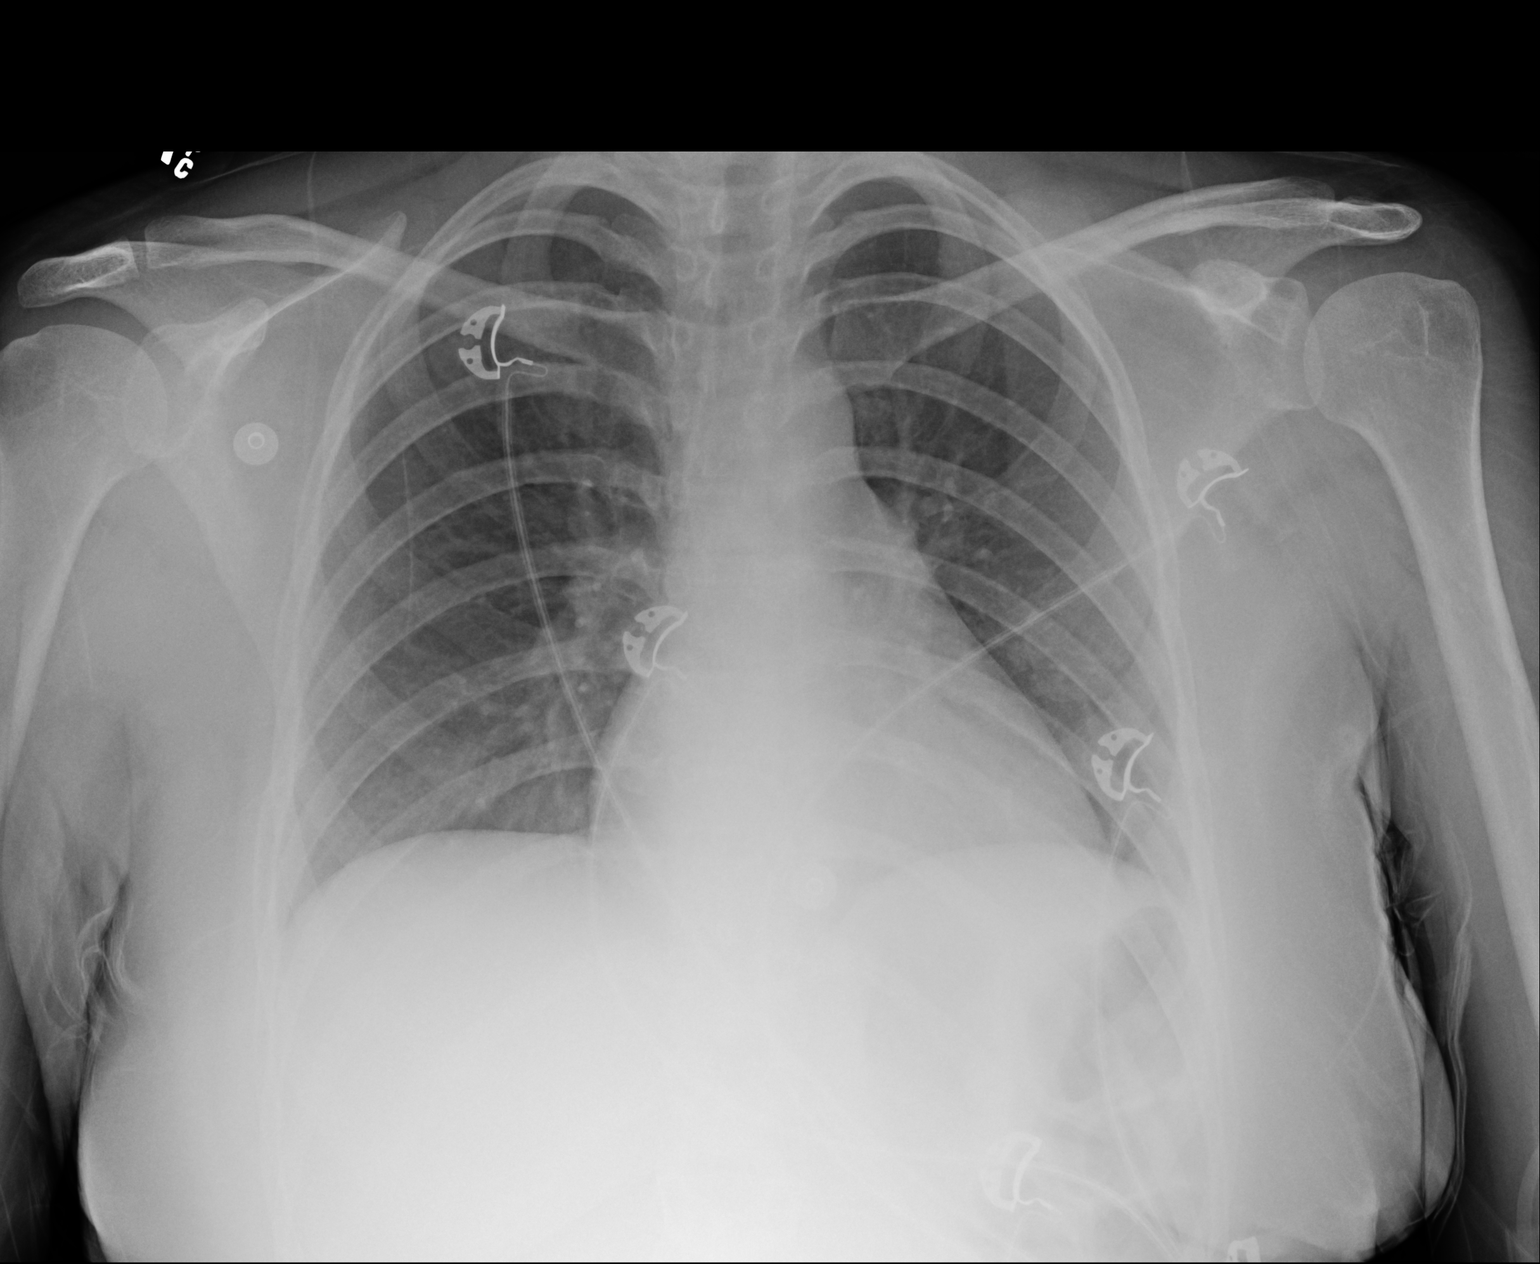

[1 of 1 positions shown; findings below may reference images not displayed]

FINDINGS: The heart size and mediastinal contours are within normal limits.
Both lungs are clear. The visualized skeletal structures are
unremarkable.
IMPRESSION: No active disease.

## 2018-11-29 MED ORDER — ENOXAPARIN SODIUM 40 MG/0.4ML ~~LOC~~ SOLN
40.0000 mg | Freq: Every day | SUBCUTANEOUS | Status: DC
  Administered 2018-11-30 – 2018-12-01 (×2): 40 mg via SUBCUTANEOUS
  Filled 2018-11-29 (×2): qty 0.4

## 2018-11-29 MED ORDER — MAGNESIUM SULFATE 2 GM/50ML IV SOLN: 2.0000 g | INTRAVENOUS | Status: DC

## 2018-11-29 MED ORDER — LEVETIRACETAM IN NACL 500 MG/100ML IV SOLN
500.0000 mg | Freq: Once | INTRAVENOUS | Status: AC
  Administered 2018-11-29: 500 mg via INTRAVENOUS
  Filled 2018-11-29 (×2): qty 100

## 2018-11-29 MED ORDER — LORAZEPAM 2 MG/ML IJ SOLN
2.0000 mg | INTRAMUSCULAR | Status: AC
  Filled 2018-11-29: qty 1

## 2018-11-29 MED ORDER — MAGNESIUM SULFATE 2 GM/50ML IV SOLN
INTRAVENOUS | Status: AC
  Administered 2018-11-29: 4 g via INTRAVENOUS
  Filled 2018-11-29: qty 50

## 2018-11-29 MED ORDER — LORAZEPAM 2 MG/ML IJ SOLN
1.0000 mg | Freq: Once | INTRAMUSCULAR | Status: AC
  Administered 2018-11-29: 1 mg via INTRAVENOUS

## 2018-11-29 MED ORDER — LEVETIRACETAM IN NACL 1000 MG/100ML IV SOLN
1000.0000 mg | Freq: Two times a day (BID) | INTRAVENOUS | Status: DC
  Administered 2018-11-30: 1000 mg via INTRAVENOUS
  Filled 2018-11-29: qty 100

## 2018-11-29 MED ORDER — ONDANSETRON HCL 4 MG PO TABS: 4.0000 mg | ORAL_TABLET | Freq: Four times a day (QID) | ORAL | Status: DC | PRN

## 2018-11-29 MED ORDER — LORAZEPAM 2 MG/ML IJ SOLN
INTRAMUSCULAR | Status: AC
  Administered 2018-11-29: 1 mg via INTRAVENOUS
  Filled 2018-11-29: qty 1

## 2018-11-29 MED ORDER — MAGNESIUM SULFATE 40 G IN LACTATED RINGERS - SIMPLE
2.0000 g/h | INTRAVENOUS | Status: DC
  Administered 2018-11-29: 2 g/h via INTRAVENOUS
  Filled 2018-11-29: qty 500

## 2018-11-29 MED ORDER — ACETAMINOPHEN 325 MG PO TABS
650.0000 mg | ORAL_TABLET | Freq: Four times a day (QID) | ORAL | Status: DC | PRN
  Filled 2018-11-29: qty 2

## 2018-11-29 MED ORDER — SODIUM CHLORIDE 0.9 % IV BOLUS
1000.0000 mL | Freq: Once | INTRAVENOUS | Status: AC
  Administered 2018-11-29: 1000 mL via INTRAVENOUS

## 2018-11-29 MED ORDER — ONDANSETRON HCL 4 MG/2ML IJ SOLN: 4.0000 mg | Freq: Four times a day (QID) | INTRAMUSCULAR | Status: DC | PRN

## 2018-11-29 MED ORDER — POTASSIUM CHLORIDE IN NACL 20-0.9 MEQ/L-% IV SOLN
INTRAVENOUS | Status: DC
  Administered 2018-11-29 – 2018-12-01 (×3): via INTRAVENOUS
  Filled 2018-11-29 (×5): qty 1000

## 2018-11-29 MED ORDER — ACETAMINOPHEN 650 MG RE SUPP: 650.0000 mg | Freq: Four times a day (QID) | RECTAL | Status: DC | PRN

## 2018-11-29 MED ORDER — MAGNESIUM SULFATE 2 GM/50ML IV SOLN
INTRAVENOUS | Status: AC
  Filled 2018-11-29: qty 50

## 2018-11-29 MED ORDER — LEVETIRACETAM IN NACL 1500 MG/100ML IV SOLN
1500.0000 mg | Freq: Once | INTRAVENOUS | Status: AC
  Administered 2018-11-29: 1500 mg via INTRAVENOUS
  Filled 2018-11-29 (×2): qty 100

## 2018-11-29 MED ORDER — SODIUM CHLORIDE 0.9 % IV SOLN
75.0000 mL/h | INTRAVENOUS | Status: DC
  Administered 2018-11-30: 75 mL/h via INTRAVENOUS

## 2018-11-29 MED ORDER — MAGNESIUM SULFATE 4 GM/100ML IV SOLN: 4.0000 g | Freq: Once | INTRAVENOUS | Status: DC

## 2018-11-29 MED ORDER — MAGNESIUM SULFATE 4 GM/100ML IV SOLN
4.0000 g | Freq: Once | INTRAVENOUS | Status: AC
  Administered 2018-11-29: 4 g via INTRAVENOUS

## 2018-11-29 NOTE — Telephone Encounter (Signed)
Multiple attempts to reach pt to find out who her neurologist is so results can be faxed. Unable to reach pt.  Encounter closed. JSY

## 2018-11-29 NOTE — ED Notes (Signed)
GTC x1 lasting appx 1 min. EDP in room. Pt did bite tongue. Ativan 1mg  given.  Per edp

## 2018-11-29 NOTE — ED Notes (Signed)
GTC x1 witnessed by RN  Around 2100. Lasted appx 45 seconds. Pt c/o of typical aura of eye flutter which then progressed into a GTC. EDP to room. Orders placed

## 2018-11-29 NOTE — ED Provider Notes (Signed)
[facility] Emergency Department Provider Note MRN:  [id number]  Arrival date & time: 11/29/18     Chief Complaint   Seizures   History of Present Illness   Erin Krueger is a 23 y.o. year-old female with a history of seizure disorder, recent labs from delivery presenting to the ED with chief complaint of seizures.  [facility] jail with report of multiple seizures.  Report of hypoxia and hypotension.  EMS explains that she had her 10th and 11th seizure in their car in route, was given 2 mg midazolam.  Patient is 3 weeks postpartum.  Noncompliant with home Keppra for 3 days.  I was unable to obtain an accurate HPI, PMH, or ROS due to the patient's altered mental status.  Review of Systems  Positive for seizures, recent vaginal delivery.  Patient's Health History    Past Medical History:  Diagnosis Date  . ADHD (attention deficit hyperactivity disorder)   . Allergy   . Anxiety   . Asthma   . Eating disorder   . Headache(784.0)   . Kidney stone    kidney stones  . Migraines   . ODD (oppositional defiant disorder)   . PID (acute pelvic inflammatory disease) 01/29/2016  . PTSD (post-traumatic stress disorder)   . Seizures (HCC)    diagnosed age 23    Past Surgical History:  Procedure Laterality Date  . right knee surgery    . TONSILLECTOMY AND ADENOIDECTOMY      Family History  Problem Relation Age of Onset  . Diabetes Mother   . Diabetes Sister   . Breast cancer Maternal Grandmother   . Diabetes Maternal Grandmother   . Glaucoma Maternal Grandmother     Social History   Socioeconomic History  . Marital status: Single    Spouse name: Not on file  . Number of children: Not on file  . Years of education: Not on file  . Highest education level: Not on file  Occupational History  . Not on file  Social Needs  . Financial resource strain: Not on file  . Food insecurity:    Worry: Not on file    Inability: Not on file  . Transportation  needs:    Medical: Not on file    Non-medical: Not on file  Tobacco Use  . Smoking status: Current Every Day Smoker    Types: Cigarettes, E-cigarettes  . Smokeless tobacco: Never Used  . Tobacco comment: uses e-cig daily   Substance and Sexual Activity  . Alcohol use: Not Currently    Comment: 3-4 glassed vodka/beer(not while pregnant)  . Drug use: Not Currently    Types: Marijuana    Comment: no drug use since pregnant- cocaine last use 2018  . Sexual activity: Yes    Birth control/protection: None  Lifestyle  . Physical activity:    Days per week: Not on file    Minutes per session: Not on file  . Stress: Not on file  Relationships  . Social connections:    Talks on phone: Not on file    Gets together: Not on file    Attends religious service: Not on file    Active member of club or organization: Not on file    Attends meetings of clubs or organizations: Not on file    Relationship status: Not on file  . Intimate partner violence:    Fear of current or ex partner: Not on file    Emotionally abused: Not on file  Physically abused: Not on file    Forced sexual activity: Not on file  Other Topics Concern  . Not on file  Social History Narrative  . Not on file     Physical Exam  Vital Signs and Nursing Notes reviewed Vitals:   11/29/18 2215 11/29/18 2230  BP:  (!) 120/98  Pulse: 64 (!) 146  Resp: 15 15  SpO2: 99% 100%    CONSTITUTIONAL: Ill-appearing, NAD NEURO: Somnolent but awake, nonverbal but moving all extremities, following commands. EYES:  eyes equal and reactive, normal extraocular movements ENT/NECK:  no LAD, no JVD CARDIO: Regular rate, well-perfused, normal S1 and S2 PULM:  CTAB no wheezing or rhonchi GI/GU:  normal bowel sounds, non-distended, non-tender MSK/SPINE:  No gross deformities, no edema SKIN:  no rash, atraumatic PSYCH:  Appropriate speech and behavior  Diagnostic and Interventional Summary    EKG  Interpretation  Date/Time:  Tuesday November 29 2018 20:49:19 EST Ventricular Rate:  64 PR Interval:    QRS Duration: 104 QT Interval:  389 QTC Calculation: 402 R Axis:   88 Text Interpretation:  Sinus rhythm since last tracing no significant change Confirmed by Mancel BaleWentz, Elliott [phone]) on 11/29/2018 9:08:59 PM      Labs Reviewed  COMPREHENSIVE METABOLIC PANEL - Abnormal; Notable for the following components:      Result Value   CO2 20 (*)    Calcium 8.4 (*)    Total Protein 6.2 (*)    Albumin 3.3 (*)    AST 13 (*)    All other components within normal limits  ACETAMINOPHEN LEVEL - Abnormal; Notable for the following components:   Acetaminophen (Tylenol), Serum <10 (*)    All other components within normal limits  CBC  LIPASE, BLOOD  ETHANOL  SALICYLATE LEVEL  MAGNESIUM  PHOSPHORUS  URINALYSIS, ROUTINE W REFLEX MICROSCOPIC  I-STAT BETA HCG BLOOD, ED ([facility], [facility], [facility] ONLY)  I-STAT TROPONIN, ED  I-STAT CG4 LACTIC ACID, ED  I-STAT BETA HCG BLOOD, ED ([facility], [facility], [facility] ONLY)    DG Chest Port 1 View  Final Result      Medications  0.9 % NaCl with KCl 20 mEq/ L  infusion (has no administration in time range)  magnesium sulfate 40 grams in LR 500 mL OB infusion (has no administration in time range)  sodium chloride 0.9 % bolus 1,000 mL (0 mLs Intravenous Stopped 11/29/18 2143)  magnesium sulfate IVPB 4 g 100 mL (0 g Intravenous Stopped 11/29/18 2144)  levETIRAcetam (KEPPRA) IVPB 1500 mg/ 100 mL premix (0 mg Intravenous Stopped 11/29/18 2143)  LORazepam (ATIVAN) injection 1 mg (1 mg Intravenous Given 11/29/18 2102)  LORazepam (ATIVAN) injection 1 mg (1 mg Intravenous Given 11/29/18 2211)  levETIRAcetam (KEPPRA) IVPB 500 mg/100 mL premix (500 mg Intravenous New Bag/Given 11/29/18 2225)     Procedures Critical Care Critical Care Documentation Critical care time provided by me (excluding procedures): 36 minutes  Condition necessitating critical care: Status epilepticus  Components of critical care  management: reviewing of prior records, laboratory and imaging interpretation, frequent re-examination and reassessment of vital signs, administration of IV magnesium, IV Ativan, discussion with consulting services.    ED Course and Medical Decision Making  I have reviewed the triage vital signs and the nursing notes.  Pertinent labs & imaging results that were available during my care of the patient were reviewed by me and considered in my medical decision making (see below for details).  Most likely status epilepticus related to medication noncompliance in this  23 year old female with known seizure disorder.  Complicating factors is her recent vaginal delivery 3 weeks ago and the possibility of eclampsia.  Given the number of repeated seizures, given magnesium empirically.  Report of hypoxia and hypotension prior to arrival, vital signs stable here, protecting airway, satting 100% on room air, following commands.  Will load with Keppra, await lab and urine evaluation to further assess the possibility of eclampsia.  Clinical Course as of Nov 29 2250  Tue Nov 29, 2018  2111 Nursing reports 30 to 45 seconds of tonic-clonic seizure activity that resolved on its own.  Was unresponsive to sternal rub during the event.  On my evaluation is postictal, but looking around the room, moving all extremities.  Will discuss other management options with neurology.   [MB]    Clinical Course User Index [MB] Sabas Sous, MD    Discussed with neurology, who agrees with loading dose of Keppra, they recommend 2 g so we will provide additional 500 mg on top of the original 1500 mg.  There was no clear indication for transport to [facility] when it appeared that patient had broken from her status.  After discussing with neurology, patient had been additional seizure lasting about 1 minute, now back to a postictal state.  Discussed with Dr. Robb Matar of the hospitalist service, given the lack of consistent availability  of neurology here, patient would be better helped at [facility].  Will be transported to stepdown unit there.  Dr. Amada Jupiter had mentioned that he would be happy to evaluate the patient at [facility] if she were to be transported.  Patient has demonstrated consistent airway protection during her time in the ED, no indication for further airway management or intubation at this time.  Given the patient has had moments of clarity being alert and oriented x4, moving all extremities with no focal neurological deficits, no evidence of trauma, no report of trauma, known seizure disorder, CNS imaging held for now, need for her to be reevaluated by neurology.  Labs with no evidence of elevated LFTs to suggest eclampsia, urinalysis pending, should follow-up protein.  Elmer Sow. Pilar Plate, MD [facility] Emergency Medicine [facility] [email or web address]  Final Clinical Impressions(s) / ED Diagnoses     ICD-10-CM   1. Status epilepticus (HCC) G40.901   2. Hypotension I95.9 DG Chest [facility] 1 View    DG Chest [place] 1 View    ED Discharge Orders    None         Sabas Sous, MD 11/29/18 2253

## 2018-11-29 NOTE — ED Notes (Signed)
XR in room 

## 2018-11-29 NOTE — H&P (Signed)
History and Physical    Erin Krueger ZMO:[id number] DOB: Aug 26, 1996 DOA: 11/29/2018  PCP: [facility]   Patient coming from: [facility].  I have personally briefly reviewed patient's old medical records in [facility]  Chief Complaint: Seizures.  HPI: Erin Krueger is a 23 y.o. female with medical history significant of ADHD, allergies, anxiety, asthma, unspecified eating disorder, oppositional defiant disorder, PTSD migraine headaches, urolithiasis, history of PID, seizures disorder since age 80 who is 3 weeks postpartum and was brought to the emergency department due to having a seizure while being detained out of the [facility] detention center.  Apparently she has not been taking her medications recently like she should.  No further history is available at this time.  ED Course: Initial vital signs temperature 97.7 F, pulse 58, respirations 22, blood pressure 127/73 mmHg and O2 sat 100% on supplemental oxygen.  She was given an NS 1 L bolus, Ativan 1 mg IVP x1, Keppra 1500 mg IVP x1 and 4 g of magnesium sulfate IVPB.  About an hour after this, she had more seizure activity, was given another milligram of Ativan IVP and 500 mg of Keppra IVPB.  Dr. Pilar Plate discussed the case with neurology on-call (Dr. Amada Jupiter) who recommended admission to our facility, but stated that if the patient continued to have seizures may have to be transferred to [facility].  Her urinalysis showed large hemoglobinuria, trace leukocyte esterase, 5 mg/dL of ketones.  There was rare bacteria on microscopic exam.  CBC was normal.  Troponin, lactic acid, i-STAT hCG, lipase, salicylate, acetaminophen, alcohol level, magnesium and phosphorus were within normal limits.  Her CMP shows a CO2 of 20 mmol/L, all other electrolytes are within normal limits when calcium of 8.4 is corrected to an albumin of 3.3 g/dL.  Total protein was 6.2 g/dL and AST was 13 units/L.  All other CMP values, including  renal function, are normal.  Her chest radiograph did not show any acute cardiopulmonary pathology.  Review of Systems: UINABLE TO OBTAIN.  Past Medical History:  Diagnosis Date  . ADHD (attention deficit hyperactivity disorder)   . Allergy   . Anxiety   . Asthma   . Eating disorder   . Headache(784.0)   . Kidney stone    kidney stones  . Migraines   . ODD (oppositional defiant disorder)   . PID (acute pelvic inflammatory disease) 01/29/2016  . PTSD (post-traumatic stress disorder)   . Seizures (HCC)    diagnosed age 52    Past Surgical History:  Procedure Laterality Date  . right knee surgery    . TONSILLECTOMY AND ADENOIDECTOMY       reports that she has been smoking cigarettes and e-cigarettes. She has never used smokeless tobacco. She reports previous alcohol use. She reports previous drug use. Drug: Marijuana.  Allergies  Allergen Reactions  . Bee Venom Anaphylaxis  . Cashew Nut Oil Anaphylaxis  . Penicillins Anaphylaxis    Has patient had a PCN reaction causing immediate rash, facial/tongue/throat swelling, SOB or lightheadedness with hypotension: Unknown Has patient had a PCN reaction causing severe rash involving mucus membranes or skin necrosis: Unknown Has patient had a PCN reaction that required hospitalization: Unknown Has patient had a PCN reaction occurring within the last 10 years: Unknown If all of the above answers are "NO", then may proceed with Cephalosporin use.   . Prednisone Other (See Comments)    Bleeding nose bleeding and internal bleeding  . Cocoa Butter Rash  .  Latex Rash  . Shellfish Allergy Hives and Rash    Family History  Problem Relation Age of Onset  . Diabetes Mother   . Diabetes Sister   . Breast cancer Maternal Grandmother   . Diabetes Maternal Grandmother   . Glaucoma Maternal Grandmother     Prior to Admission medications   Medication Sig Start Date End Date Taking? Authorizing Provider  acetaminophen (TYLENOL) 500 MG  tablet Take 500 mg by mouth 2 (two) times daily.    Yes [provider]  Prenatal Vit-Fe Fumarate-FA (PRENATAL MULTIVITAMIN) TABS tablet Take 1 tablet by mouth daily at 12 noon. 04/26/18  Yes Cresenzo-Dishmon, Scarlette CalicoFrances, CNM  albuterol (PROVENTIL HFA;VENTOLIN HFA) 108 (90 Base) MCG/ACT inhaler Inhale 2 puffs into the lungs every 6 (six) hours as needed for wheezing or shortness of breath. 04/05/18   Cheral MarkerBooker, Kimberly R, CNM  EPINEPHrine 0.3 mg/0.3 mL IJ SOAJ injection Inject 0.3 mLs (0.3 mg total) into the muscle as needed. 06/27/18   Cheral MarkerBooker, Kimberly R, CNM  folic acid (FOLVITE) 1 MG tablet Take 1 mg by mouth daily.    [provider]  ibuprofen (ADVIL,MOTRIN) 600 MG tablet Take 1 tablet (600 mg total) by mouth every 6 (six) hours as needed. 11/10/18   [place] BingPickens, Charlie, MD  levETIRAcetam (KEPPRA) 500 MG tablet Take 2 tablets (1,000 mg total) by mouth 2 (two) times daily. 08/29/18   Meccariello, Solmon IceBailey J, DO  nitrofurantoin, macrocrystal-monohydrate, (MACROBID) 100 MG capsule Take 1 capsule (100 mg total) by mouth at bedtime. 11/10/18 12/10/18  [place] BingPickens, Charlie, MD    Physical Exam: Vitals:   11/29/18 2200 11/29/18 2215 11/29/18 2230 11/29/18 2253  BP: (!) 130/97  (!) 120/98 129/88  Pulse: 69 64 (!) 146 67  Resp: 16 15 15 12   SpO2: 99% 99% 100% 100%  Weight:      Height:        Constitutional: Sedated.  She has urinated on her clothes. Eyes: PERRL, lids and conjunctivae normal ENMT: Mucous membranes are mildly dry. Posterior pharynx clear of any exudate or lesions. Neck: normal, supple, no masses, no thyromegaly Respiratory: clear to auscultation bilaterally, no wheezing, no crackles. Normal respiratory effort. No accessory muscle use.  Cardiovascular: Regular rate and rhythm, no murmurs / rubs / gallops. No extremity edema. 2+ pedal pulses. No carotid bruits.  Abdomen: Soft, no tenderness, no masses palpated. No hepatosplenomegaly. Bowel sounds positive.  Musculoskeletal: no  clubbing / cyanosis. Good ROM, no contractures. Normal muscle tone.  Skin: no rashes, lesions, ulcers. No induration Neurologic: The patient is very somnolent and sedated.  She moves all extremities. Psychiatric: Sedated.  Unable to obtain.  Labs on Admission: I have personally reviewed following labs and imaging studies  CBC: Recent Labs  Lab 11/29/18 2108  WBC 6.3  HGB 12.8  HCT 40.6  MCV 91.6  PLT 223   Basic Metabolic Panel: Recent Labs  Lab 11/29/18 2108  NA 141  K 3.6  CL 111  CO2 20*  GLUCOSE 87  BUN 9  CREATININE 0.60  CALCIUM 8.4*  MG 2.4  PHOS 2.7   GFR: Estimated Creatinine Clearance: 119.8 mL/min (by C-G formula based on SCr of 0.6 mg/dL). Liver Function Tests: Recent Labs  Lab 11/29/18 2108  AST 13*  ALT 15  ALKPHOS 75  BILITOT 0.4  PROT 6.2*  ALBUMIN 3.3*   Recent Labs  Lab 11/29/18 2108  LIPASE 31   No results for input(s): AMMONIA in the last 168 hours. Coagulation Profile: No results  for input(s): INR, PROTIME in the last 168 hours. Cardiac Enzymes: No results for input(s): CKTOTAL, CKMB, CKMBINDEX, TROPONINI in the last 168 hours. BNP (last 3 results) No results for input(s): PROBNP in the last 8760 hours. HbA1C: No results for input(s): HGBA1C in the last 72 hours. CBG: No results for input(s): GLUCAP in the last 168 hours. Lipid Profile: No results for input(s): CHOL, HDL, LDLCALC, TRIG, CHOLHDL, LDLDIRECT in the last 72 hours. Thyroid Function Tests: No results for input(s): TSH, T4TOTAL, FREET4, T3FREE, THYROIDAB in the last 72 hours. Anemia Panel: No results for input(s): VITAMINB12, FOLATE, FERRITIN, TIBC, IRON, RETICCTPCT in the last 72 hours. Urine analysis:    Component Value Date/Time   COLORURINE YELLOW 11/03/2018 0245   APPEARANCEUR CLEAR 11/03/2018 0245   APPEARANCEUR Cloudy (A) 04/05/2018 1533   LABSPEC 1.010 11/03/2018 0245   PHURINE 6.0 11/03/2018 0245   GLUCOSEU NEGATIVE 11/03/2018 0245   HGBUR NEGATIVE  11/03/2018 0245   BILIRUBINUR NEGATIVE 11/03/2018 0245   BILIRUBINUR Negative 04/05/2018 1533   KETONESUR NEGATIVE 11/03/2018 0245   PROTEINUR NEGATIVE 11/03/2018 0245   UROBILINOGEN 0.2 03/30/2015 2201   NITRITE POSITIVE (A) 11/03/2018 0245   LEUKOCYTESUR SMALL (A) 11/03/2018 0245   LEUKOCYTESUR 2+ (A) 04/05/2018 1533    Radiological Exams on Admission: Dg Chest Port 1 View  Result Date: 11/29/2018 CLINICAL DATA:  Seizure and hypotension EXAM: PORTABLE CHEST 1 VIEW COMPARISON:  02/07/2016 FINDINGS: The heart size and mediastinal contours are within normal limits. Both lungs are clear. The visualized skeletal structures are unremarkable. IMPRESSION: No active disease. Electronically Signed   By: Jasmine Pang M.D.   On: 11/29/2018 21:27    EKG: Independently reviewed Vent. rate 64 BPM PR interval * ms QRS duration 104 ms QT/QTc 389/402 ms P-R-T axes 40 88 50 Sinus rhythm  Assessment/Plan Principal Problem:   Status epilepticus (HCC) Transfer to [facility] to progressive unit. Continue Keppra 1000 mg IVPB every 12 hours. Continue lorazepam as needed for for seizures.  Continue magnesium sulfate infusion. Inpatient neurology evaluation. May need EEG.  Active Problems:   Asthma No dyspnea or wheezing at this time. Supplemental oxygen and bronchodilators as needed.    DVT prophylaxis: Lovenox SQ. Code Status: Full code. Family Communication:  Disposition Plan: Transfer to [facility] for neurology evaluation and EEG. Consults called: Neuro hospitalist will be consulted. Admission status: Observation/progressive unit.   Bobette Mo MD [facility]  If 7PM-7AM, please contact night-coverage [email or web address] Password [facility]  11/29/2018, 11:22 PM

## 2018-11-29 NOTE — ED Notes (Signed)
Pt alert to person and year

## 2018-11-29 NOTE — ED Notes (Signed)
Pt currently a and o x4

## 2018-11-29 NOTE — ED Triage Notes (Signed)
[facility] with c/o seizures tonight, 9 total in last 60 minutes. Last seizure lasting 45 seconds per EMS. 2.5 Medazolam given. Per EMS she is 3 weeks postpartum and had eclamptic seizures.

## 2018-11-29 NOTE — ED Notes (Signed)
Called ac for med 

## 2018-11-30 ENCOUNTER — Observation Stay (HOSPITAL_COMMUNITY): Payer: Medicaid Other

## 2018-11-30 DIAGNOSIS — R1033 Periumbilical pain: Secondary | ICD-10-CM

## 2018-11-30 DIAGNOSIS — G40901 Epilepsy, unspecified, not intractable, with status epilepticus: Secondary | ICD-10-CM | POA: Diagnosis not present

## 2018-11-30 DIAGNOSIS — Z9114 Patient's other noncompliance with medication regimen: Secondary | ICD-10-CM

## 2018-11-30 DIAGNOSIS — J45909 Unspecified asthma, uncomplicated: Secondary | ICD-10-CM | POA: Diagnosis not present

## 2018-11-30 DIAGNOSIS — G40919 Epilepsy, unspecified, intractable, without status epilepticus: Secondary | ICD-10-CM

## 2018-11-30 LAB — GLUCOSE, CAPILLARY: Glucose-Capillary: 94 mg/dL (ref 70–99)

## 2018-11-30 IMAGING — DX DG ABD PORTABLE 1V
1 series · 2 of 2 positions shown · non-contrast
Comparison: Portable exam [7C] hours compared to CT abdomen and
pelvis [DATE]

CLINICAL DATA: Abdominal pain, seizures, history asthma, smoker,
kidney stones

EXAM:
PORTABLE ABDOMEN - 1 VIEW

[Series 1: abdomen · 0.14mm/px · 2 of 2 slices shown]
[im 1/2]
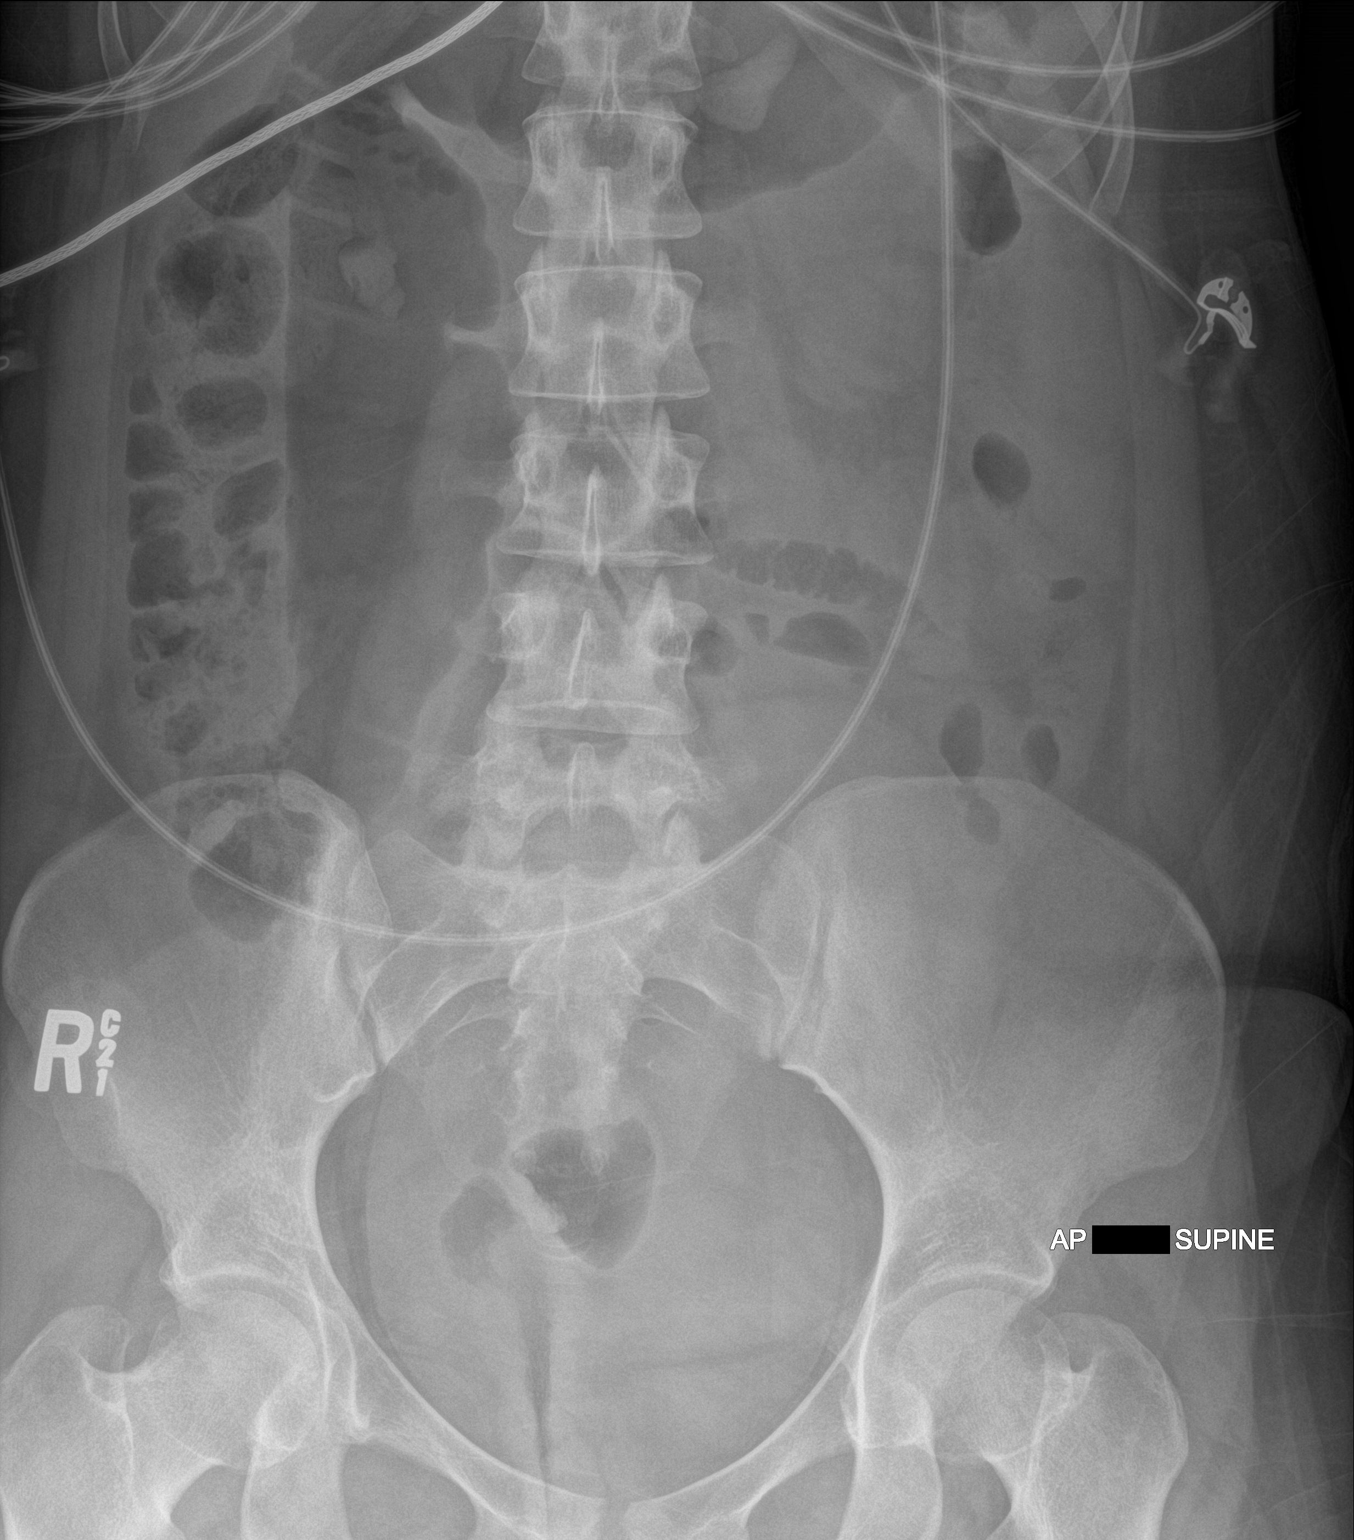
[im 2/2]
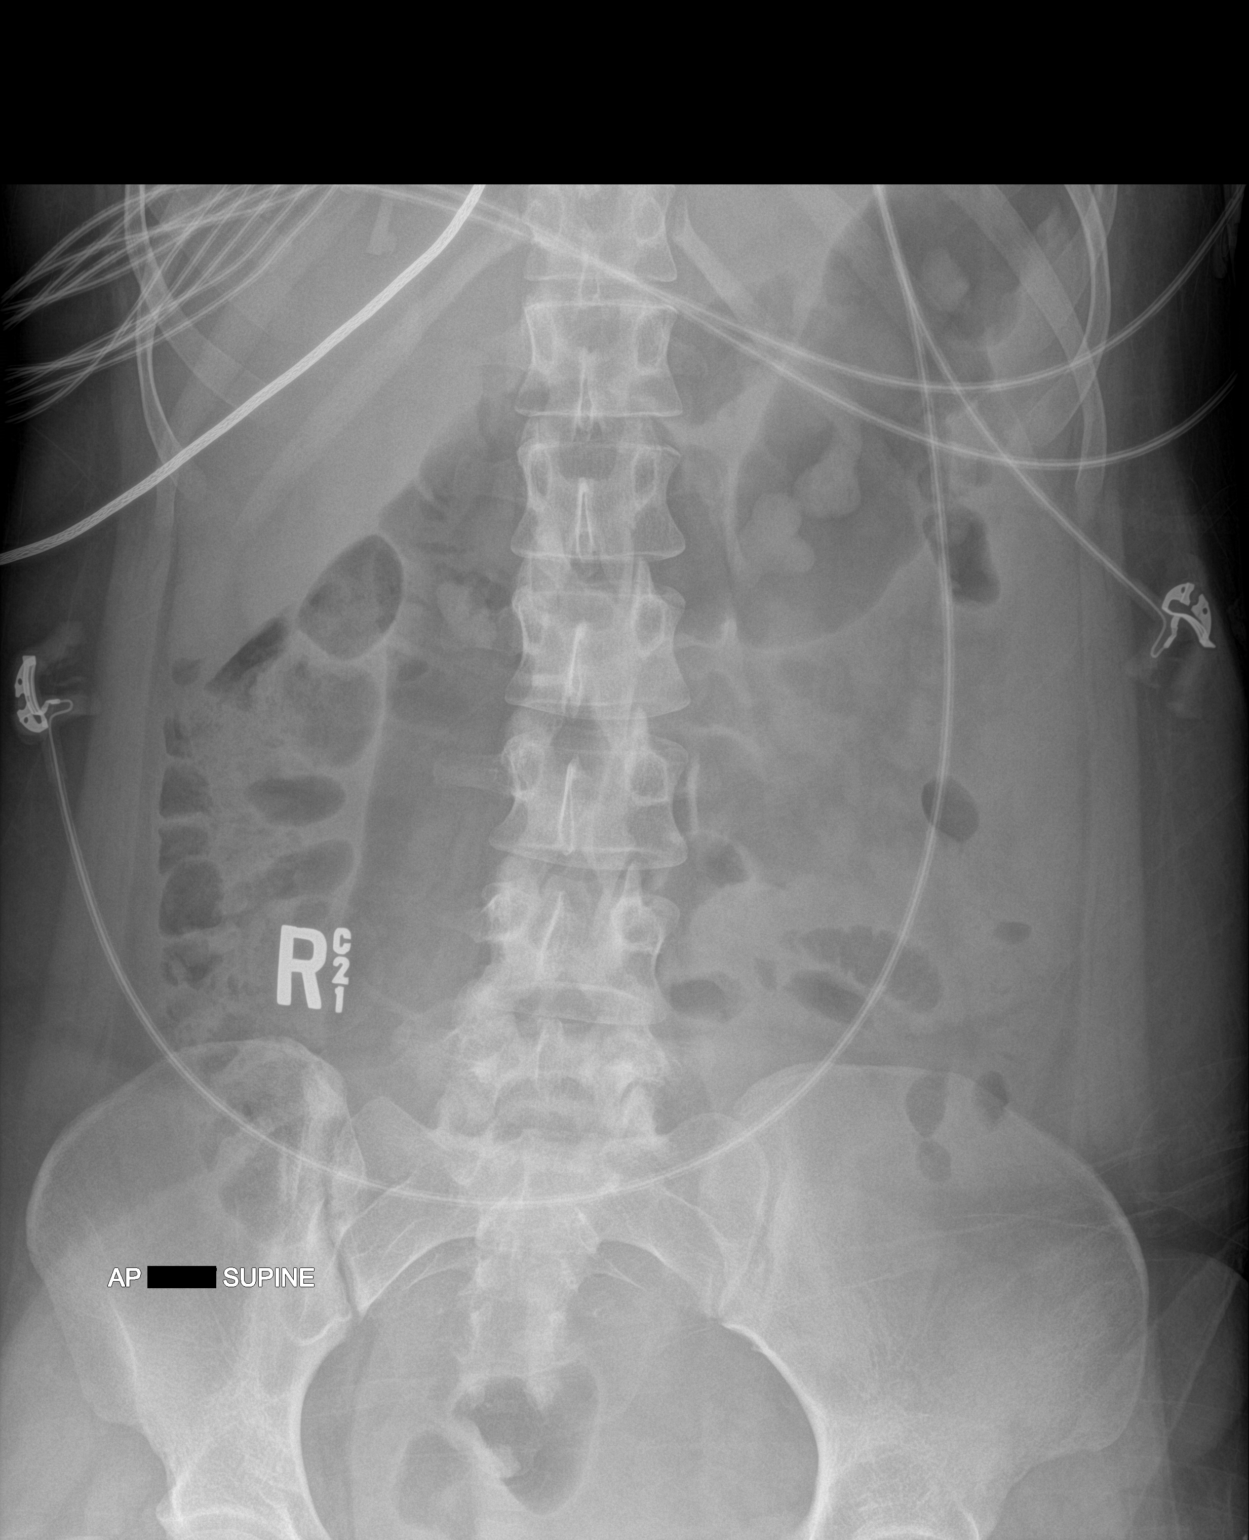

[2 of 2 positions shown; findings below may reference images not displayed]

FINDINGS: Normal bowel gas pattern.

No bowel dilatation or bowel wall thickening.

Osseous structures unremarkable.

No urinary tract calcifications.
IMPRESSION: Normal exam.

## 2018-11-30 MED ORDER — LEVETIRACETAM 500 MG PO TABS
1000.0000 mg | ORAL_TABLET | Freq: Two times a day (BID) | ORAL | Status: DC
  Administered 2018-11-30 – 2018-12-01 (×3): 1000 mg via ORAL
  Filled 2018-11-30 (×3): qty 2

## 2018-11-30 MED ORDER — ALBUTEROL SULFATE (2.5 MG/3ML) 0.083% IN NEBU: 2.5000 mg | INHALATION_SOLUTION | Freq: Four times a day (QID) | RESPIRATORY_TRACT | Status: DC | PRN

## 2018-11-30 NOTE — ED Notes (Signed)
[facility] arrived to pick up patient

## 2018-11-30 NOTE — Progress Notes (Addendum)
Brief History Erin Krueger is a 23 y.o. 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 She had multiple seizures in jail and was given midazolam by EMS, and then given Ativan and Keppra in the [facility] at [facility]. She was transferred to [facility] for EEG.  Following this, she has not had any further seizures.   ROS A 14 point ROS was performed and is negative except as noted in the HPI.   Subjective Prison guard at bedside. Pt remains shackled to bed. No further seizures. She is tearful and requesting to see her baby. DSS is involved in the case.   Past Medical History Past Medical History:  Diagnosis Date  . ADHD (attention deficit hyperactivity disorder)   . Allergy   . Anxiety   . Asthma   . Eating disorder   . Headache(784.0)   . Kidney stone    kidney stones  . Migraines   . ODD (oppositional defiant disorder)   . PID (acute pelvic inflammatory disease) 01/29/2016  . PTSD (post-traumatic stress disorder)   . Seizures Erin Krueger(HCC)    diagnosed age 89    Past Surgical History Past Surgical History:  Procedure Laterality Date  . right knee surgery    . TONSILLECTOMY AND ADENOIDECTOMY      Allergies Allergies  Allergen Reactions  . Bee Venom Anaphylaxis  . Cashew Nut Oil Anaphylaxis  . Penicillins Anaphylaxis    Has patient had a PCN reaction causing immediate rash, facial/tongue/throat swelling, SOB or lightheadedness with hypotension: Unknown Has patient had a PCN reaction causing severe rash involving mucus membranes or skin necrosis: Unknown Has patient had a PCN reaction that required hospitalization: Unknown Has patient had a PCN reaction occurring within the last 10 years: Unknown If all of the above answers are "NO", then may proceed with Cephalosporin use.   . Prednisone Other (See Comments)    Bleeding nose bleeding and  internal bleeding  . Cocoa Butter Rash  . Latex Rash  . Shellfish Allergy Hives and Rash    Home Medications Medications Prior to Admission  Medication Sig Dispense Refill  . acetaminophen (TYLENOL) 500 MG tablet Take 500 mg by mouth 2 (two) times daily.     Marland Kitchen. levETIRAcetam (KEPPRA) 500 MG tablet Take 2 tablets (1,000 mg total) by mouth 2 (two) times daily. 120 tablet 0  . Prenatal Vit-Fe Fumarate-FA (PRENATAL MULTIVITAMIN) TABS tablet Take 1 tablet by mouth daily at 12 noon. 30 tablet 11  . albuterol (PROVENTIL HFA;VENTOLIN HFA) 108 (90 Base) MCG/ACT inhaler Inhale 2 puffs into the lungs every 6 (six) hours as needed for wheezing or shortness of breath. 1 Inhaler 2  . EPINEPHrine 0.3 mg/0.3 mL IJ SOAJ injection Inject 0.3 mLs (0.3 mg total) into the muscle as needed. 1 Device 0  . folic acid (FOLVITE) 1 MG tablet Take 1 mg by mouth daily.    Marland Kitchen. ibuprofen (ADVIL,MOTRIN) 600 MG tablet Take 1 tablet (600 mg total) by mouth every 6 (six) hours as needed. 30 tablet 0  . nitrofurantoin, macrocrystal-monohydrate, (MACROBID) 100 MG capsule Take 1 capsule (100 mg total) by mouth at bedtime. 30 capsule 0    [facility] Medications . enoxaparin (LOVENOX) injection  40 mg Subcutaneous Daily  . levETIRAcetam  1,000 mg Oral BID     Objective  Intake/Output from previous day: No  intake/output data recorded. Intake/Output this shift: No intake/output data recorded. Nutritional status:  Diet Order            Diet NPO time specified  Diet effective now               Physical Exam -  Vitals:   11/30/18 0143 11/30/18 0337 11/30/18 0735 11/30/18 1150  BP: 108/76 111/76 106/64 112/72  Pulse: 60  68   Resp: 13  16 16   Temp: 97.7 F (36.5 C) 98 F (36.7 C)  98.6 F (37 C)  TempSrc: Oral Oral  Oral  SpO2: 97%  97%   Weight: 75.4 kg     Height: 5\' 7"  (1.702 m)      General - some emotional distress; crying; requesting to see her 3wk old baby (she says baby is in custody of DSS) Heart -  Regular rate and rhythm - no murmer Lungs - Clear to auscultation Abdomen - Soft - non tender Extremities - Distal pulses intact - no edema Skin - Warm and dry  Neurologic Exam:   Mental Status:  Alert, oriented, thought content appropriate. Speech without evidence of dysarthria or aphasia. Able to follow 3 step commands without difficulty.  Cranial Nerves:  II-bilateral visual fields intact III/IV/VI-Pupils were equal and reacted. Extraocular movements were full.  V/VII-no facial numbness and no facial weakness.  VIII-hearing normal.  X-normal speech and symmetrical palatal movement.  XII-midline tongue extension  Motor: Right : Upper extremity   5/5    Left:     Upper extremity   5/5  Lower extremity   5/5     Lower extremity   5/5 Tone and bulk:normal tone throughout; no atrophy noted Sensory: Intact to light touch in all extremities. Deep Tendon Reflexes: 2/4 throughout Plantars: Downgoing bilaterally  Cerebellar: Normal finger to nose and heel to shin bilaterally. Gait: not tested    LABORATORY RESULTS:  Basic Metabolic Panel: Recent Labs  Lab 11/29/18 2108  NA 141  K 3.6  CL 111  CO2 20*  GLUCOSE 87  BUN 9  CREATININE 0.60  CALCIUM 8.4*  MG 2.4  PHOS 2.7    Liver Function Tests: Recent Labs  Lab 11/29/18 2108  AST 13*  ALT 15  ALKPHOS 75  BILITOT 0.4  PROT 6.2*  ALBUMIN 3.3*   Recent Labs  Lab 11/29/18 2108  LIPASE 31   No results for input(s): AMMONIA in the last 168 hours.  CBC: Recent Labs  Lab 11/29/18 2108  WBC 6.3  HGB 12.8  HCT 40.6  MCV 91.6  PLT 223    Cardiac Enzymes: No results for input(s): CKTOTAL, CKMB, CKMBINDEX, TROPONINI in the last 168 hours.  Lipid Panel: No results for input(s): CHOL, TRIG, HDL, CHOLHDL, VLDL, LDLCALC in the last 168 hours.  CBG: No results for input(s): GLUCAP in the last 168 hours.  Microbiology:   Coagulation Studies: No results for input(s): LABPROT, INR in the last 72  hours.  Miscellaneous Labs:   IMAGING RESULTS Dg Chest Port 1 View  Result Date: 11/29/2018 CLINICAL DATA:  Seizure and hypotension EXAM: PORTABLE CHEST 1 VIEW COMPARISON:  02/07/2016 FINDINGS: The heart size and mediastinal contours are within normal limits. Both lungs are clear. The visualized skeletal structures are unremarkable. IMPRESSION: No active disease. Electronically Signed   By: Jasmine PangKim  Fujinaga M.D.   On: 11/29/2018 21:27   Dg Abd Portable 1v  Result Date: 11/30/2018 CLINICAL DATA:  Abdominal pain, seizures, history asthma, smoker, kidney stones  EXAM: PORTABLE ABDOMEN - 1 VIEW COMPARISON:  Portable exam 1034 hours compared to CT abdomen and pelvis 09/12/2017 FINDINGS: Normal bowel gas pattern. No bowel dilatation or bowel wall thickening. Osseous structures unremarkable. No urinary tract calcifications. IMPRESSION: Normal exam. Electronically Signed   By: Ulyses Southward M.D.   On: 11/30/2018 10:53     EEG:  Assessment/Plan: 23yr old with self reported seizures, on keppra at home. Recently arrested and in jail. She was not taking ASD. Here for breakthrough seizures. None since arrival. No utility in EEG at this time. She is back to baseline.   RECS: Continue Keppra 1000mg  BID, I have changed this to PO now that she is A&O I have discussed importance on compliance with both pt and her guard. Guard tells me upon intake she neglected to tell them she was taking keppra at all. He confirms they do in fact have keepra to give her. She states she has out pt f/u with local neurology who prescribes Keppra. Keep reg scheduled appts We will s/o. Ok to d/c at this time  [employer], ARNP-C, ANVP-BC [facility]

## 2018-11-30 NOTE — Progress Notes (Signed)
PROGRESS NOTE    Erin BertholdWillow Krueger  NWG:[id number] DOB: [birth year] DOA: 11/29/2018 [facility]   Brief Narrative: Erin Krueger is a 23 y.o. female with history of ADHD, allergic rhinitis, anxiety, asthma, oppositional defiant disorder, PTSD, migraine, seizures, PID.  Patient presents secondary to recurrent seizures concerning for status epilepticus.  Seizures seem to be precipitated secondary to nonadherence with patient loaded with Keppra.  Neurology is consulted and following.   Assessment & Plan:   Principal Problem:   Status epilepticus (HCC) Active Problems:   Asthma   Status epilepticus Patient loaded with Keppra and also received Ativan. Currently receiving Keppra IV. -Neurology recommendations: Continue IV Keppra, EEG if patient does not return to baseline  Asthma Currently on exacerbation. -Continue albuterol as needed  Abdominal pain Unclear etiology. Periumbilical. No associated symptoms -Abdominal x-ray  DVT prophylaxis: Lovenox Code Status:   Code Status: Full Code Family Communication: None at bedside Disposition Plan: Discharge likely in 24 hours if no recurrent seizures   Consultants:   Neurology  Procedures:   None  Antimicrobials:  None    Subjective: Mild blurry vision. Periumbilical, persistent sharp abdominal pain that has been present for the past couple of days with no radiation and no associated nausea, vomiting, diarrhea or constipation. No hematochezia.  Objective: Vitals:   11/30/18 0031 11/30/18 0143 11/30/18 0337 11/30/18 0735  BP:  108/76 111/76 106/64  Pulse:  60  68  Resp: 20 13  16   Temp:  97.7 F (36.5 C) 98 F (36.7 C)   TempSrc:  Oral Oral   SpO2:  97%  97%  Weight:  75.4 kg    Height:  5\' 7"  (1.702 m)     No intake or output data in the 24 hours ending 11/30/18 0922 Filed Weights   11/29/18 2042 11/30/18 0143  Weight: 81.6 kg 75.4 kg    Examination:  General exam:  Appears calm and comfortable Respiratory system: Clear to auscultation. Respiratory effort normal. Cardiovascular system: S1 & S2 heard, RRR. No murmurs, rubs, gallops or clicks. Gastrointestinal system: Abdomen is nondistended, soft and mildly tender with mild guarding. No organomegaly or masses felt. Normal bowel sounds heard. Central nervous system: Somewhat drowsy but alert and oriented. No focal neurological deficits. Extremities: No edema. No calf tenderness Skin: No cyanosis. No rashes Psychiatry: Judgement and insight appear normal. Mood & affect appropriate.     Data Reviewed: I have personally reviewed following labs and imaging studies  CBC: Recent Labs  Lab 11/29/18 2108  WBC 6.3  HGB 12.8  HCT 40.6  MCV 91.6  PLT 223   Basic Metabolic Panel: Recent Labs  Lab 11/29/18 2108  NA 141  K 3.6  CL 111  CO2 20*  GLUCOSE 87  BUN 9  CREATININE 0.60  CALCIUM 8.4*  MG 2.4  PHOS 2.7   GFR: Estimated Creatinine Clearance: 117.8 mL/min (by C-G formula based on SCr of 0.6 mg/dL). Liver Function Tests: Recent Labs  Lab 11/29/18 2108  AST 13*  ALT 15  ALKPHOS 75  BILITOT 0.4  PROT 6.2*  ALBUMIN 3.3*   Recent Labs  Lab 11/29/18 2108  LIPASE 31   No results for input(s): AMMONIA in the last 168 hours. Coagulation Profile: No results for input(s): INR, PROTIME in the last 168 hours. Cardiac Enzymes: No results for input(s): CKTOTAL, CKMB, CKMBINDEX, TROPONINI in the last 168 hours. BNP (last 3 results) No results for input(s): PROBNP in the last 8760 hours. HbA1C: No  results for input(s): HGBA1C in the last 72 hours. CBG: No results for input(s): GLUCAP in the last 168 hours. Lipid Profile: No results for input(s): CHOL, HDL, LDLCALC, TRIG, CHOLHDL, LDLDIRECT in the last 72 hours. Thyroid Function Tests: No results for input(s): TSH, T4TOTAL, FREET4, T3FREE, THYROIDAB in the last 72 hours. Anemia Panel: No results for input(s): VITAMINB12, FOLATE,  FERRITIN, TIBC, IRON, RETICCTPCT in the last 72 hours. Sepsis Labs: Recent Labs  Lab 11/29/18 2115  LATICACIDVEN 1.59    No results found for this or any previous visit (from the past 240 hour(s)).       Radiology Studies: Dg Chest Port 1 View  Result Date: 11/29/2018 CLINICAL DATA:  Seizure and hypotension EXAM: PORTABLE CHEST 1 VIEW COMPARISON:  02/07/2016 FINDINGS: The heart size and mediastinal contours are within normal limits. Both lungs are clear. The visualized skeletal structures are unremarkable. IMPRESSION: No active disease. Electronically Signed   By: Jasmine Pang M.D.   On: 11/29/2018 21:27        Scheduled Meds: . enoxaparin (LOVENOX) injection  40 mg Subcutaneous Daily   Continuous Infusions: . 0.9 % NaCl with KCl 20 mEq / L 125 mL/hr at 11/30/18 0744  . levETIRAcetam    . magnesium sulfate    . magnesium sulfate 2 g/hr (11/29/18 2256)     LOS: 0 days     Jacquelin Hawking, MD [facility] 11/30/2018, 9:22 AM  If 7PM-7AM, please contact night-coverage [email or web address]

## 2018-11-30 NOTE — Consult Note (Signed)
Neurology Consultation Reason for Consult: Seizures Referring Physician: Robb Matar, D  CC: Seizures  History is obtained from: Patient, chart  HPI: Erin Krueger is a 23 y.o. female with a history of seizures since age 36 per the patient, who is recently postpartum and then was taken to jail 3 days ago and has reportedly not received her Keppra since that time.  She was doing well up until today, at which point she began having multiple seizures.  She had a total of 12 or 13, I am unclear on the exact number and she is too somnolent to provide a clear history.  She was given midazolam by EMS, and then given Ativan and Keppra in the ER at [facility].  Following this, she has not had any further seizures.   ROS: A 14 point ROS was performed and is negative except as noted in the HPI.   Past Medical History:  Diagnosis Date  . ADHD (attention deficit hyperactivity disorder)   . Allergy   . Anxiety   . Asthma   . Eating disorder   . Headache(784.0)   . Kidney stone    kidney stones  . Migraines   . ODD (oppositional defiant disorder)   . PID (acute pelvic inflammatory disease) 01/29/2016  . PTSD (post-traumatic stress disorder)   . Seizures [facility])    diagnosed age 46     Family History  Problem Relation Age of Onset  . Diabetes Mother   . Diabetes Sister   . Breast cancer Maternal Grandmother   . Diabetes Maternal Grandmother   . Glaucoma Maternal Grandmother      Social History:  reports that she has been smoking cigarettes and e-cigarettes. She has never used smokeless tobacco. She reports previous alcohol use. She reports previous drug use. Drug: Marijuana.   Exam: Current vital signs: BP 111/76 (BP Location: Right Arm)   Pulse 60   Temp 98 F (36.7 C) (Oral)   Resp 13   Ht 5\' 7"  (1.702 m)   Wt 75.4 kg   LMP 01/26/2018   SpO2 97%   BMI 26.03 kg/m  Vital signs in last 24 hours: Temp:  [97.7 F (36.5 C)-98 F (36.7 C)] 98 F (36.7 C) (01/08 0337) Pulse Rate:   [58-146] 60 (01/08 0143) Resp:  [10-22] 13 (01/08 0143) BP: (103-141)/(63-124) 111/76 (01/08 0337) SpO2:  [97 %-100 %] 97 % (01/08 0143) Weight:  [75.4 kg-81.6 kg] 75.4 kg (01/08 0143)   Physical Exam  Constitutional: Appears well-developed and well-nourished.  Psych: Affect appropriate to situation Eyes: No scleral injection HENT: No OP obstrucion Head: Normocephalic.  Cardiovascular: Normal rate and regular rhythm.  Respiratory: Effort normal, non-labored breathing GI: Soft.  No distension. There is no tenderness.  Skin: WDI  Neuro: Mental Status: Patient is somnolent but arousable oriented to person, place, she does give the month is December 2019 Patient is able to give a clear and coherent history. No signs of aphasia or neglect Cranial Nerves: II: Visual Fields are full. Pupils are equal, round, and reactive to light.   III,IV, VI: EOMI without ptosis or diploplia.  V: Facial sensation is symmetric to temperature VII: Facial movement is symmetric.  VIII: hearing is intact to voice X: Uvula elevates symmetrically XI: Shoulder shrug is symmetric. XII: tongue is midline without atrophy or fasciculations.  Motor: Tone is normal. Bulk is normal. 5/5 strength was present in all four extremities.  Sensory: Sensation is symmetric to light touch and temperature in the arms and  legs. Cerebellar: No clear ataxia  I have reviewed labs in epic and the results pertinent to this consultation are: CMP-unremarkable  I have reviewed the images obtained: CT head from 2017, unremarkable  Impression: 23 year old female with self-reported history of seizures since age 6 and recent delivery 3 weeks ago who presents with recurrent seizures in the setting of Keppra noncompliance.  She will need to be restarted on her home dose of Keppra 1 g twice daily  Recommendations: 1) Keppra 1 g twice daily 2) EEG only if she continues to be off from her typical baseline    Ritta Slot,  MD [facility] [phone]  If 7pm- 7am, please page neurology on call as listed in AMION.

## 2018-12-01 DIAGNOSIS — J45909 Unspecified asthma, uncomplicated: Secondary | ICD-10-CM | POA: Diagnosis not present

## 2018-12-01 DIAGNOSIS — G40901 Epilepsy, unspecified, not intractable, with status epilepticus: Secondary | ICD-10-CM | POA: Diagnosis not present

## 2018-12-01 MED ORDER — LABETALOL HCL 5 MG/ML IV SOLN
INTRAVENOUS | Status: AC
  Administered 2018-12-01: 10 mg via INTRAVENOUS
  Filled 2018-12-01: qty 4

## 2018-12-01 MED ORDER — ACETAMINOPHEN 500 MG PO TABS: 500.0000 mg | ORAL_TABLET | Freq: Four times a day (QID) | ORAL | Status: DC | PRN

## 2018-12-01 MED ORDER — LEVETIRACETAM 1000 MG PO TABS: 1000.0000 mg | ORAL_TABLET | Freq: Two times a day (BID) | ORAL | 0 refills | Status: DC

## 2018-12-01 NOTE — Discharge Summary (Addendum)
Physician Discharge Summary  [place] WGN:[id number] DOB: 08-05-1996 DOA: 11/29/2018  PCP: [facility]  Admit date: 11/29/2018 Discharge date: 12/01/2018  Admitted From: Jail Disposition: Jail  Recommendations for Outpatient Follow-up:  1. Follow up with PCP when able or available physician in 1 week 2. Please obtain BMP/CBC in one week 3. Please follow up on the following pending results: None  Home Health: None Equipment/Devices: None  Discharge Condition: Stable CODE STATUS: Full code Diet recommendation: Regular diet   Brief/Interim Summary:  Admission HPI written by Bobette Mo, MD   Chief Complaint: Seizures.  HPI: Erin Krueger is a 23 y.o. 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  Apparently she has not been taking her medications recently like she should.  No further history is available at this time.  ED Course: Initial vital signs temperature 97.7 F, pulse 58, respirations 22, blood pressure 127/73 mmHg and O2 sat 100% on supplemental oxygen.  She was given an NS 1 L bolus, Ativan 1 mg IVP x1, Keppra 1500 mg IVP x1 and 4 g of magnesium sulfate IVPB.  About an hour after this, she had more seizure activity, was given another milligram of Ativan IVP and 500 mg of Keppra IVPB.  Dr. Pilar Plate discussed the case with neurology on-call (Dr. Amada Jupiter) who recommended admission to our facility, but stated that if the patient continued to have seizures may have to be transferred to [facility].  Her urinalysis showed large hemoglobinuria, trace leukocyte esterase, 5 mg/dL of ketones.  There was rare  bacteria on microscopic exam.  CBC was normal.  Troponin, lactic acid, i-STAT hCG, lipase, salicylate, acetaminophen, alcohol level, magnesium and phosphorus were within normal limits.  Her CMP shows a CO2 of 20 mmol/L, all other electrolytes are within normal limits when calcium of 8.4 is corrected to an albumin of 3.3 g/dL.  Total protein was 6.2 g/dL and AST was 13 units/L.  All other CMP values, including renal function, are normal.  Her chest radiograph did not show any acute cardiopulmonary pathology.   Hospital course:  Status epilepticus Patient loaded with Keppra and also received Ativan. Episodes secondary to medication non-adherence. Neurology consulted and recommended continuing home Keppra dose. No EEG performed as patient returned to previous baseline. Home Keppra dose continued on discharge.  Asthma Currently no exacerbation. Albuterol as needed  Abdominal pain Unclear etiology. Periumbilical. No associated symptoms. X-ray negative.  Discharge Diagnoses:  Principal Problem:   Status epilepticus [facility]) Active Problems:   Breakthrough seizure (HCC)   Asthma   History of medication noncompliance    Discharge Instructions  Discharge Instructions    Increase activity slowly   Complete by:  As directed      Allergies as of 12/01/2018      Reactions   Bee Venom Anaphylaxis   Cashew Nut Oil Anaphylaxis   Penicillins Anaphylaxis   Has patient had a PCN reaction causing immediate rash, facial/tongue/throat swelling, SOB or lightheadedness with hypotension: Unknown Has patient had a PCN reaction causing severe rash involving mucus membranes or skin necrosis: Unknown Has patient had a PCN reaction that required hospitalization: Unknown Has patient had a PCN reaction occurring within the last 10 years:  Unknown If all of the above answers are "NO", then may proceed with Cephalosporin use.   Prednisone Other (See Comments)   Bleeding nose bleeding and internal bleeding    Cocoa Butter Rash   Latex Rash   Shellfish Allergy Hives, Rash      Medication List    STOP taking these medications   nitrofurantoin (macrocrystal-monohydrate) 100 MG capsule Commonly known as:  MACROBID     TAKE these medications   acetaminophen 500 MG tablet Commonly known as:  TYLENOL Take 1 tablet (500 mg total) by mouth every 6 (six) hours as needed for mild pain, moderate pain, fever or headache. What changed:    when to take this  reasons to take this   albuterol 108 (90 Base) MCG/ACT inhaler Commonly known as:  PROVENTIL HFA;VENTOLIN HFA Inhale 2 puffs into the lungs every 6 (six) hours as needed for wheezing or shortness of breath.   EPINEPHrine 0.3 mg/0.3 mL Soaj injection Commonly known as:  EPI-PEN Inject 0.3 mLs (0.3 mg total) into the muscle as needed.   folic acid 1 MG tablet Commonly known as:  FOLVITE Take 1 mg by mouth daily.   ibuprofen 600 MG tablet Commonly known as:  ADVIL,MOTRIN Take 1 tablet (600 mg total) by mouth every 6 (six) hours as needed.   levETIRAcetam 1000 MG tablet Commonly known as:  KEPPRA Take 1 tablet (1,000 mg total) by mouth 2 (two) times daily. What changed:  medication strength   prenatal multivitamin Tabs tablet Take 1 tablet by mouth daily at 12 noon.      Follow-up Information    [facility]. Schedule an appointment as soon as possible for a visit in 1 week(s).   Specialty:  Family Medicine Contact information: [address] [place] [address] [phone]          Allergies  Allergen Reactions  . Bee Venom Anaphylaxis  . Cashew Nut Oil Anaphylaxis  . Penicillins Anaphylaxis    Has patient had a PCN reaction causing immediate rash, facial/tongue/throat swelling, SOB or lightheadedness with hypotension: Unknown Has patient had a PCN reaction causing severe rash involving mucus membranes or skin necrosis: Unknown Has patient had a PCN reaction that  required hospitalization: Unknown Has patient had a PCN reaction occurring within the last 10 years: Unknown If all of the above answers are "NO", then may proceed with Cephalosporin use.   . Prednisone Other (See Comments)    Bleeding nose bleeding and internal bleeding  . Cocoa Butter Rash  . Latex Rash  . Shellfish Allergy Hives and Rash    Consultations:  Neurology   Procedures/Studies: Dg Chest Port 1 View  Result Date: 11/29/2018 CLINICAL DATA:  Seizure and hypotension EXAM: PORTABLE CHEST 1 VIEW COMPARISON:  02/07/2016 FINDINGS: The heart size and mediastinal contours are within normal limits. Both lungs are clear. The visualized skeletal structures are unremarkable. IMPRESSION: No active disease. Electronically Signed   By: Jasmine Pang M.D.   On: 11/29/2018 21:27   Dg Abd Portable 1v  Result Date: 11/30/2018 CLINICAL DATA:  Abdominal pain, seizures, history asthma, smoker, kidney stones EXAM: PORTABLE ABDOMEN - 1 VIEW COMPARISON:  Portable exam 1034 hours compared to CT abdomen and pelvis 09/12/2017 FINDINGS: Normal bowel gas pattern. No bowel dilatation or bowel wall thickening. Osseous structures unremarkable. No urinary tract calcifications. IMPRESSION: Normal exam. Electronically Signed   By: Ulyses Southward M.D.   On: 11/30/2018 10:53  Subjective: Feels weak.  Discharge Exam: Vitals:   12/01/18 0500 12/01/18 0802  BP:  97/68  Pulse: (!) 59 (!) 58  Resp: 18 17  Temp:  99 F (37.2 C)  SpO2: 97% 97%   Vitals:   12/01/18 0000 12/01/18 0449 12/01/18 0500 12/01/18 0802  BP: 99/70 101/75  97/68  Pulse: 63  (!) 59 (!) 58  Resp: 17  18 17   Temp: 98.6 F (37 C) 98.6 F (37 C)  99 F (37.2 C)  TempSrc: Oral Oral  Oral  SpO2: 98%  97% 97%  Weight:      Height:        General: Pt is alert, awake, not in acute distress    The results of significant diagnostics from this hospitalization (including imaging, microbiology, ancillary and laboratory) are listed  below for reference.     Microbiology: No results found for this or any previous visit (from the past 240 hour(s)).   Labs: BNP (last 3 results) No results for input(s): BNP in the last 8760 hours. Basic Metabolic Panel: Recent Labs  Lab 11/29/18 2108  NA 141  K 3.6  CL 111  CO2 20*  GLUCOSE 87  BUN 9  CREATININE 0.60  CALCIUM 8.4*  MG 2.4  PHOS 2.7   Liver Function Tests: Recent Labs  Lab 11/29/18 2108  AST 13*  ALT 15  ALKPHOS 75  BILITOT 0.4  PROT 6.2*  ALBUMIN 3.3*   Recent Labs  Lab 11/29/18 2108  LIPASE 31   No results for input(s): AMMONIA in the last 168 hours. CBC: Recent Labs  Lab 11/29/18 2108  WBC 6.3  HGB 12.8  HCT 40.6  MCV 91.6  PLT 223   Cardiac Enzymes: No results for input(s): CKTOTAL, CKMB, CKMBINDEX, TROPONINI in the last 168 hours. BNP: Invalid input(s): POCBNP CBG: Recent Labs  Lab 11/30/18 0111  GLUCAP 94   D-Dimer No results for input(s): DDIMER in the last 72 hours. Hgb A1c No results for input(s): HGBA1C in the last 72 hours. Lipid Profile No results for input(s): CHOL, HDL, LDLCALC, TRIG, CHOLHDL, LDLDIRECT in the last 72 hours. Thyroid function studies No results for input(s): TSH, T4TOTAL, T3FREE, THYROIDAB in the last 72 hours.  Invalid input(s): FREET3 Anemia work up No results for input(s): VITAMINB12, FOLATE, FERRITIN, TIBC, IRON, RETICCTPCT in the last 72 hours. Urinalysis    Component Value Date/Time   COLORURINE YELLOW 11/29/2018 2315   APPEARANCEUR CLEAR 11/29/2018 2315   APPEARANCEUR Cloudy (A) 04/05/2018 1533   LABSPEC 1.015 11/29/2018 2315   PHURINE 6.0 11/29/2018 2315   GLUCOSEU NEGATIVE 11/29/2018 2315   HGBUR LARGE (A) 11/29/2018 2315   BILIRUBINUR NEGATIVE 11/29/2018 2315   BILIRUBINUR Negative 04/05/2018 1533   KETONESUR 5 (A) 11/29/2018 2315   PROTEINUR NEGATIVE 11/29/2018 2315   UROBILINOGEN 0.2 03/30/2015 2201   NITRITE NEGATIVE 11/29/2018 2315   LEUKOCYTESUR TRACE (A) 11/29/2018  2315   LEUKOCYTESUR 2+ (A) 04/05/2018 1533    SIGNED:   Jacquelin Hawkingalph Bhavana Kady, MD [facility] 12/01/2018, 8:41 AM

## 2018-12-01 NOTE — Care Management Note (Signed)
Case Management Note  Patient Details  Name: Erin Krueger MRN: [id number] Date of Birth: 02/15/1996  Subjective/Objective:                    Action/Plan: Pt from [facility] jail. She is returning there at d/c. Pts guard at the bedside has contact the nursing unit at the jail and they are expecting the patient. Guard is to provide transport to the jail. Guard provided d/c information and prescriptions per bedside RN.   Expected Discharge Date:  12/01/18               Expected Discharge Plan:  Corrections Facility  In-House Referral:     Discharge planning Services  CM Consult  Post Acute Care Choice:    Choice offered to:     DME Arranged:    DME Agency:     HH Arranged:    HH Agency:     Status of Service:  Completed, signed off  If discussed at [employer], dates discussed:    Additional Comments:  Kermit Balo, RN 12/01/2018, 12:58 PM

## 2018-12-01 NOTE — Progress Notes (Signed)
CSW acknowledging consult for "current substance abuse". CSW unable to assist currently as patient is incarcerated at this time; she will not be able to pursue treatment outside of jail until she is released, and there will be social workers within the prison who will be able to assist with coordinating treatment when appropriate.  CSW signing off.  Blenda Nicely, [place] Clinical Social Worker [phone]

## 2018-12-01 NOTE — Discharge Instructions (Signed)
Seizure, Adult  A seizure is a sudden burst of abnormal electrical activity in the brain. The abnormal activity temporarily interrupts normal brain function, causing a person to experience any of the following:  · Involuntary movements.  · Changes in awareness or consciousness.  · Uncontrollable shaking (convulsions).  Seizures usually last from 30 seconds to 2 minutes. They usually do not cause permanent brain damage unless they are prolonged.  What are the causes?  This condition may be caused by:  · Fever.  · Low blood sugar.  · Medicine.  · Illness.  · Brain injury.  · Brain tumor.  · Stroke.  · A condition that is passed from parent to child (genetic).  · Addiction to a substance (substanceuse disorder) or suddenly stopping the use of a substance (withdrawal).  Some people who have a seizure never have another one. People who have repeated seizures have a condition called epilepsy.  What are the signs or symptoms?  Symptoms of this condition vary greatly from person to person. They include:  · Convulsions.  · Stiffening of the body.  · Involuntary movements of the arms or legs.  · Loss of consciousness.  · Breathing problems.  · Falling suddenly.  · Confusion.  · Head nodding.  · Eye blinking or fluttering.  · Lip smacking or tongue biting.  · Drooling.  · Rapid eye movements.  · Grunting.  · Loss of bladder control and bowel control.  · Staring.  · Unresponsiveness.  Some people have symptoms right before a seizure happens (aura) and right after a seizure happens.  · Symptoms that may occur before a seizure include:  ? Fear or anxiety.  ? Nausea.  ? Feeling like the room is spinning (vertigo).  ? A feeling of having seen or heard something before (déjà vu).  ? Odd tastes or smells.  ? Changes in vision, such as seeing flashing lights or spots.  · Symptoms that may occur after a seizure include:  ? Confusion.  ? Sleepiness.  ? Headache.  ? Weakness on one side of the body.  How is this diagnosed?  This  condition may be diagnosed based on medical history and physical exam.  You may also have other tests, including:  · Blood tests.  · Electroencephalogram, EEG.  · CT scan.  · MRI.  · Spinal tap (lumbar puncture).  How is this treated?  Most seizures will stop on their own in under 5 minutes and no treatment is needed. Seizures lasting longer than 5 minutes will usually need treatment. Treatment includes:  · Medicines given through IV.  · Avoiding known triggers, such as medicines that you take for another condition.  · Medicines to treat epilepsy (antiepileptics), if epilepsy caused your seizures.  · Surgery to stop seizures, if you have epilepsy that does not respond to medicines.  Follow these instructions at home:  Medicines  · Take over-the-counter and prescription medicines only as told by your health care provider.  · Avoid any substances that may prevent your medicine from working properly, such as alcohol.  Activity  · Do not drive, swim, or do any other activities that would be dangerous if you had another seizure. Wait until your health care provider says it is safe to do them.  · If you live in the [place] check with your local DMV (department of motor vehicles) to find out about local driving laws. Each state has specific rules about when you can legally return to driving.  ·   Get enough rest. Lack of sleep can make seizures more likely to occur.  Educating others  Teach friends and family what to do if you have a seizure. They should:  · Lay you on the ground to prevent a fall.  · Cushion your head and body.  · Loosen any tight clothing around your neck.  · Turn you on your side. If vomiting occurs, this helps keep your airway clear.  · Not hold you down. Holding you down will not stop the seizure.  · Not put anything into your mouth.  · Know whether or not you need emergency care.  · Stay with you until you recover.    General instructions  · Contact your health care provider each time you have a  seizure.  · Avoid anything that has ever triggered a seizure for you.  · Keep a seizure diary. Record what you remember about each seizure, especially anything that might have triggered the seizure.  · Keep all follow-up visits as told by your health care provider. This is important.  Contact a health care provider if:  · You have another seizure.  · You have seizures more often.  · Your seizure symptoms change.  · You continue to have seizures with treatment.  · You have symptoms of an infection or illness. These might increase your risk of having a seizure.  Get help right away if:  · You have a seizure that:  ? Lasts longer than 5 minutes.  ? Is different than previous seizures.  ? Leaves you unable to speak or use a part of your body.  ? Makes it harder to breathe.  · You have:  ? A seizure after a head injury.  ? Multiple seizures in a row.  ? Confusion or a severe headache right after a seizure.  · You are having seizures more often.  · You do not wake up immediately after a seizure.  · You injure yourself during a seizure.  These symptoms may represent a serious problem that is an emergency. Do not wait to see if the symptoms will go away. Get medical help right away. Call your local emergency services (911 in the [place] Do not drive yourself to the hospital.  Summary  · Seizures are caused by abnormal electrical activity in the brain. The activity disrupts normal brain function, leading to a change in consciousness, abnormal movements, or convulsions.  · There are many causes of seizures including illnesses, medicines, genetic conditions, head injuries, strokes, tumors, substance abuse, or substance withdrawal.  · Most seizures will stop on their own in under 5 minutes. Seizures lasting longer than 5 minutes are a medical emergency and require immediate treatment.  · There are many medicines that are used to treat seizures. Take over-the-counter and prescription medicines only as told by your health care  provider.  This information is not intended to replace advice given to you by your health care provider. Make sure you discuss any questions you have with your health care provider.  Document Released: 11/06/2000 Document Revised: 12/16/2017 Document Reviewed: 12/16/2017  Elsevier Interactive Patient Education © 2019 [employer]

## 2018-12-13 ENCOUNTER — Encounter: Payer: Self-pay | Admitting: *Deleted

## 2018-12-13 ENCOUNTER — Ambulatory Visit: Payer: Medicaid Other | Admitting: Women's Health

## 2019-01-08 ENCOUNTER — Encounter (HOSPITAL_COMMUNITY): Payer: Self-pay | Admitting: Internal Medicine

## 2019-01-08 ENCOUNTER — Observation Stay (HOSPITAL_COMMUNITY)
Admission: EM | Admit: 2019-01-08 | Discharge: 2019-01-10 | Disposition: A | Discharge status: Home / Self Care | Attending: Internal Medicine | Admitting: Internal Medicine

## 2019-01-08 ENCOUNTER — Other Ambulatory Visit: Payer: Self-pay

## 2019-01-08 DIAGNOSIS — F909 Attention-deficit hyperactivity disorder, unspecified type: Secondary | ICD-10-CM | POA: Diagnosis not present

## 2019-01-08 DIAGNOSIS — Z888 Allergy status to other drugs, medicaments and biological substances status: Secondary | ICD-10-CM | POA: Diagnosis not present

## 2019-01-08 DIAGNOSIS — R531 Weakness: Secondary | ICD-10-CM | POA: Insufficient documentation

## 2019-01-08 DIAGNOSIS — F913 Oppositional defiant disorder: Secondary | ICD-10-CM | POA: Insufficient documentation

## 2019-01-08 DIAGNOSIS — F319 Bipolar disorder, unspecified: Secondary | ICD-10-CM | POA: Insufficient documentation

## 2019-01-08 DIAGNOSIS — R569 Unspecified convulsions: Secondary | ICD-10-CM

## 2019-01-08 DIAGNOSIS — G40401 Other generalized epilepsy and epileptic syndromes, not intractable, with status epilepticus: Principal | ICD-10-CM | POA: Insufficient documentation

## 2019-01-08 DIAGNOSIS — Z791 Long term (current) use of non-steroidal anti-inflammatories (NSAID): Secondary | ICD-10-CM | POA: Insufficient documentation

## 2019-01-08 DIAGNOSIS — Z880 Allergy status to penicillin: Secondary | ICD-10-CM | POA: Insufficient documentation

## 2019-01-08 DIAGNOSIS — F509 Eating disorder, unspecified: Secondary | ICD-10-CM | POA: Diagnosis not present

## 2019-01-08 DIAGNOSIS — J45909 Unspecified asthma, uncomplicated: Secondary | ICD-10-CM | POA: Diagnosis not present

## 2019-01-08 DIAGNOSIS — Z79899 Other long term (current) drug therapy: Secondary | ICD-10-CM | POA: Diagnosis not present

## 2019-01-08 DIAGNOSIS — G40901 Epilepsy, unspecified, not intractable, with status epilepticus: Secondary | ICD-10-CM | POA: Diagnosis not present

## 2019-01-08 DIAGNOSIS — R51 Headache: Secondary | ICD-10-CM | POA: Diagnosis not present

## 2019-01-08 DIAGNOSIS — F17210 Nicotine dependence, cigarettes, uncomplicated: Secondary | ICD-10-CM | POA: Insufficient documentation

## 2019-01-08 LAB — CBC WITH DIFFERENTIAL/PLATELET
Abs Immature Granulocytes: 0.01 10*3/uL (ref 0.00–0.07)
Basophils Absolute: 0 10*3/uL (ref 0.0–0.1)
Basophils Relative: 1 %
Eosinophils Absolute: 0.2 10*3/uL (ref 0.0–0.5)
Eosinophils Relative: 3 %
HCT: 39.5 % (ref 36.0–46.0)
Hemoglobin: 12.2 g/dL (ref 12.0–15.0)
Immature Granulocytes: 0 %
Lymphocytes Relative: 35 %
Lymphs Abs: 2.3 10*3/uL (ref 0.7–4.0)
MCH: 27.8 pg (ref 26.0–34.0)
MCHC: 30.9 g/dL (ref 30.0–36.0)
MCV: 90 fL (ref 80.0–100.0)
Monocytes Absolute: 0.5 10*3/uL (ref 0.1–1.0)
Monocytes Relative: 8 %
Neutro Abs: 3.5 10*3/uL (ref 1.7–7.7)
Neutrophils Relative %: 53 %
Platelets: 197 10*3/uL (ref 150–400)
RBC: 4.39 MIL/uL (ref 3.87–5.11)
RDW: 13.1 % (ref 11.5–15.5)
WBC: 6.5 10*3/uL (ref 4.0–10.5)
nRBC: 0 % (ref 0.0–0.2)

## 2019-01-08 LAB — I-STAT BETA HCG BLOOD, ED (MC, WL, AP ONLY): I-stat hCG, quantitative: <5 m[IU]/mL (ref <5)

## 2019-01-08 LAB — RAPID URINE DRUG SCREEN, HOSP PERFORMED
Amphetamines: NOT DETECTED
Barbiturates: NOT DETECTED
Benzodiazepines: POSITIVE — AB
Cocaine: NOT DETECTED
Opiates: NOT DETECTED
Tetrahydrocannabinol: NOT DETECTED

## 2019-01-08 LAB — BASIC METABOLIC PANEL
Anion gap: 7 (ref 5–15)
BUN: 8 mg/dL (ref 6–20)
CO2: 25 mmol/L (ref 22–32)
Calcium: 8.7 mg/dL — ABNORMAL LOW (ref 8.9–10.3)
Chloride: 109 mmol/L (ref 98–111)
Creatinine, Ser: 0.69 mg/dL (ref 0.44–1.00)
GFR calc Af Amer: >60 mL/min (ref >60)
GFR calc non Af Amer: >60 mL/min (ref >60)
Glucose, Bld: 98 mg/dL (ref 70–99)
Potassium: 3.5 mmol/L (ref 3.5–5.1)
Sodium: 141 mmol/L (ref 135–145)

## 2019-01-08 LAB — CBG MONITORING, ED: Glucose-Capillary: 92 mg/dL (ref 70–99)

## 2019-01-08 MED ORDER — ACETAMINOPHEN 325 MG PO TABS
650.0000 mg | ORAL_TABLET | Freq: Four times a day (QID) | ORAL | Status: DC | PRN
  Administered 2019-01-10: 650 mg via ORAL
  Filled 2019-01-08: qty 2

## 2019-01-08 MED ORDER — LORAZEPAM 2 MG/ML IJ SOLN
1.0000 mg | Freq: Once | INTRAMUSCULAR | Status: AC
  Administered 2019-01-08: 1 mg via INTRAVENOUS

## 2019-01-08 MED ORDER — SODIUM CHLORIDE 0.9 % IV SOLN
INTRAVENOUS | Status: AC
  Administered 2019-01-09 (×2): via INTRAVENOUS

## 2019-01-08 MED ORDER — LEVETIRACETAM IN NACL 1000 MG/100ML IV SOLN
1000.0000 mg | Freq: Once | INTRAVENOUS | Status: AC
  Administered 2019-01-08: 1000 mg via INTRAVENOUS
  Filled 2019-01-08: qty 100

## 2019-01-08 MED ORDER — ACETAMINOPHEN 650 MG RE SUPP: 650.0000 mg | Freq: Four times a day (QID) | RECTAL | Status: DC | PRN

## 2019-01-08 MED ORDER — LORAZEPAM 2 MG/ML IJ SOLN: 1.0000 mg | INTRAMUSCULAR | Status: DC | PRN

## 2019-01-08 MED ORDER — ONDANSETRON HCL 4 MG/2ML IJ SOLN: 4.0000 mg | Freq: Four times a day (QID) | INTRAMUSCULAR | Status: DC | PRN

## 2019-01-08 MED ORDER — LEVETIRACETAM IN NACL 1000 MG/100ML IV SOLN
1000.0000 mg | Freq: Two times a day (BID) | INTRAVENOUS | Status: DC
  Administered 2019-01-09: 1000 mg via INTRAVENOUS
  Filled 2019-01-08: qty 100

## 2019-01-08 MED ORDER — FOLIC ACID 1 MG PO TABS
1.0000 mg | ORAL_TABLET | Freq: Every day | ORAL | Status: DC
  Administered 2019-01-10: 1 mg via ORAL
  Filled 2019-01-08 (×2): qty 1

## 2019-01-08 MED ORDER — ONDANSETRON HCL 4 MG PO TABS: 4.0000 mg | ORAL_TABLET | Freq: Four times a day (QID) | ORAL | Status: DC | PRN

## 2019-01-08 MED ORDER — ALBUTEROL SULFATE (2.5 MG/3ML) 0.083% IN NEBU: 2.5000 mg | INHALATION_SOLUTION | Freq: Four times a day (QID) | RESPIRATORY_TRACT | Status: DC | PRN

## 2019-01-08 MED ORDER — ALBUTEROL SULFATE HFA 108 (90 BASE) MCG/ACT IN AERS: 2.0000 | INHALATION_SPRAY | Freq: Four times a day (QID) | RESPIRATORY_TRACT | Status: DC | PRN

## 2019-01-08 NOTE — ED Notes (Signed)
Attempted to complete in and out cath per MD but pt had incontinent episode. Will re-attempt

## 2019-01-08 NOTE — Consult Note (Signed)
Neurology Consultation  Reason for Consult: Multiple breakthrough seizures Referring Physician: Dr. Knox SalivaStephen Cohan  CC: Multiple breakthrough seizures  History is obtained from: Patient, chart  HPI: Erin Krueger is a 23 y.o. female past medical history of seizure disorder currently on Keppra, oppositional defiant disorder, headaches, ADHD, eating disorder, presented to the emergency room at [facility] from jail for multiple seizures while in the facility.  She was given 2.5 mg of Versed by EMS and was very drowsy initially.  She started to come around and answer some questions.  In the ER, she had another seizure that lasted 20 to 30 seconds witnessed by the charge nurse who said that the patient was incontinent of urine during that episode.  The patient has no recollection of these episodes.  Charge nurse also reported that the examination at the time right after the prior seizure was reliable for the patient being generally postictal. She has in the past been seen by our service and multiple EEGs have been performed along with LTM EEGs with only 1 of the EEGs showing left sided sharps and others being inconclusive. She reports compliance to medications.  She has not had a dose change according to her for a while. Denies any recent stress.  Reports headache.  Reports decreased sensation in her legs that has been also going on for a few days now.  ROS:ROS was performed and is negative except as noted in the HPI   Past Medical History:  Diagnosis Date  . ADHD (attention deficit hyperactivity disorder)   . Allergy   . Anxiety   . Asthma   . Eating disorder   . Headache(784.0)   . Kidney stone    kidney stones  . Migraines   . ODD (oppositional defiant disorder)   . PID (acute pelvic inflammatory disease) 01/29/2016  . PTSD (post-traumatic stress disorder)   . Seizures [facility](HCC)    diagnosed age 89    Family History  Problem Relation Age of Onset  . Diabetes Mother   . Diabetes  Sister   . Breast cancer Maternal Grandmother   . Diabetes Maternal Grandmother   . Glaucoma Maternal Grandmother    Social History:   reports that she has been smoking cigarettes and e-cigarettes. She has never used smokeless tobacco. She reports previous alcohol use. She reports previous drug use. Drug: Marijuana.  Medications  Current Facility-Administered Medications:  .  0.9 %  sodium chloride infusion, , Intravenous, Continuous, Eduard ClosKakrakandy, Arshad N, MD .  acetaminophen (TYLENOL) tablet 650 mg, 650 mg, Oral, Q6H PRN **OR** acetaminophen (TYLENOL) suppository 650 mg, 650 mg, Rectal, Q6H PRN, Eduard ClosKakrakandy, Arshad N, MD .  albuterol (PROVENTIL) (2.5 MG/3ML) 0.083% nebulizer solution 2.5 mg, 2.5 mg, Nebulization, Q6H PRN, Eduard ClosKakrakandy, Arshad N, MD .  Melene Muller[START ON 01/09/2019] folic acid (FOLVITE) tablet 1 mg, 1 mg, Oral, Daily, Eduard ClosKakrakandy, Arshad N, MD .  Melene Muller[START ON 01/09/2019] levETIRAcetam (KEPPRA) IVPB 1000 mg/100 mL premix, 1,000 mg, Intravenous, Q12H, Eduard ClosKakrakandy, Arshad N, MD .  LORazepam (ATIVAN) injection 1 mg, 1 mg, Intravenous, Q4H PRN, Eduard ClosKakrakandy, Arshad N, MD .  ondansetron (ZOFRAN) tablet 4 mg, 4 mg, Oral, Q6H PRN **OR** ondansetron (ZOFRAN) injection 4 mg, 4 mg, Intravenous, Q6H PRN, Eduard ClosKakrakandy, Arshad N, MD  Current Outpatient Medications:  .  albuterol (PROVENTIL HFA;VENTOLIN HFA) 108 (90 Base) MCG/ACT inhaler, Inhale 2 puffs into the lungs every 6 (six) hours as needed for wheezing or shortness of breath., Disp: 1 Inhaler, Rfl: 2 .  EPINEPHrine 0.3 mg/0.3  mL IJ SOAJ injection, Inject 0.3 mLs (0.3 mg total) into the muscle as needed., Disp: 1 Device, Rfl: 0 .  folic acid (FOLVITE) 1 MG tablet, Take 1 mg by mouth daily., Disp: , Rfl:  .  ibuprofen (ADVIL,MOTRIN) 200 MG tablet, Take 800 mg by mouth daily as needed for headache., Disp: , Rfl:  .  levETIRAcetam (KEPPRA) 1000 MG tablet, Take 1 tablet (1,000 mg total) by mouth 2 (two) times daily., Disp: 60 tablet, Rfl: 0 .  acetaminophen  (TYLENOL) 500 MG tablet, Take 1 tablet (500 mg total) by mouth every 6 (six) hours as needed for mild pain, moderate pain, fever or headache. (Patient not taking: Reported on 01/08/2019), Disp: , Rfl:  .  ibuprofen (ADVIL,MOTRIN) 600 MG tablet, Take 1 tablet (600 mg total) by mouth every 6 (six) hours as needed. (Patient not taking: Reported on 01/08/2019), Disp: 30 tablet, Rfl: 0 .  Prenatal Vit-Fe Fumarate-FA (PRENATAL MULTIVITAMIN) TABS tablet, Take 1 tablet by mouth daily at 12 noon. (Patient not taking: Reported on 01/08/2019), Disp: 30 tablet, Rfl: 11   Exam: Current vital signs: BP 114/84   Pulse 76   Temp 98.7 F (37.1 C) (Oral)   Resp 18   LMP 01/26/2018   SpO2 100%  Vital signs in last 24 hours: Temp:  [98.7 F (37.1 C)] 98.7 F (37.1 C) (02/16 1950) Pulse Rate:  [71-89] 76 (02/16 2130) Resp:  [16-22] 18 (02/16 2130) BP: (108-121)/(68-97) 114/84 (02/16 2130) SpO2:  [97 %-100 %] 100 % (02/16 2130) General: Patient is awake, slow to respond to questions, no distress. HEENT: Normocephalic atraumatic.  No tongue bites no fresh blood or scars from tongue bites. CVS: S1-S2 heard, regular rate rhythm Abdomen: Nondistended nontender Extremities: Warm well perfused Chest: Clear to auscultation Neurological exam Patient is awake, alert, slow to respond to questions. Her speech is clear. Attention concentration is mildly reduced. Naming, fluency, comprehension and repetition are all intact. Cranial nerves: Pupils equal round react light, extraocular movements intact visual fields full.  Face is symmetric.  Facial sensation is intact.  Auditory acuity grossly intact.  Palate elevates midline.  Shoulder shrug intact.  Tongue midline. Motor exam: 5/5 both upper extremities.  Effort dependent 4/5 both lower extremities. Sensory exam: Intact light touch all over in the upper extremities and torso.  Reports decreased sensation to light touch on both lower extremities. Coordination: Intact  finger-nose-finger although slow to perform. Difficulty performing heel-knee-shin bilaterally.   Labs I have reviewed labs in epic and the results pertinent to this consultation are: CBC    Component Value Date/Time   WBC 6.5 01/08/2019 2011   RBC 4.39 01/08/2019 2011   HGB 12.2 01/08/2019 2011   HGB 12.0 08/15/2018 0912   HCT 39.5 01/08/2019 2011   HCT 36.0 08/15/2018 0912   PLT 197 01/08/2019 2011   PLT 227 08/15/2018 0912   MCV 90.0 01/08/2019 2011   MCV 88 08/15/2018 0912   MCH 27.8 01/08/2019 2011   MCHC 30.9 01/08/2019 2011   RDW 13.1 01/08/2019 2011   RDW 12.1 (L) 08/15/2018 0912   LYMPHSABS 2.3 01/08/2019 2011   LYMPHSABS 2.7 04/05/2018 1533   MONOABS 0.5 01/08/2019 2011   EOSABS 0.2 01/08/2019 2011   EOSABS 0.2 04/05/2018 1533   BASOSABS 0.0 01/08/2019 2011   BASOSABS 0.0 04/05/2018 1533   CMP     Component Value Date/Time   NA 141 01/08/2019 2011   K 3.5 01/08/2019 2011   CL 109 01/08/2019 2011  CO2 25 01/08/2019 2011   GLUCOSE 98 01/08/2019 2011   BUN 8 01/08/2019 2011   CREATININE 0.69 01/08/2019 2011   CALCIUM 8.7 (L) 01/08/2019 2011   PROT 6.2 (L) 11/29/2018 2108   ALBUMIN 3.3 (L) 11/29/2018 2108   AST 13 (L) 11/29/2018 2108   ALT 15 11/29/2018 2108   ALKPHOS 75 11/29/2018 2108   BILITOT 0.4 11/29/2018 2108   GFRNONAA >60 01/08/2019 2011   GFRAA >60 01/08/2019 2011   Imaging I have reviewed the images obtained: No MRIs of the brain in our system.  Last CT scan of the head was done 01/07/2016 and was unremarkable.  Assessment: 23 year old with past medical history of seizure disorder currently on Keppra 1 g twice daily, oppositional defiant disorder, headaches, ADD, eating disorder, presented emergency room at [facility] with multiple seizures from her facility of incarceration.  Had another episode in the ER. Reviewing her chart, there was one mention of possible nonorganic etiology but I am not sure if that is merely a situational assessment  versus any pseudoseizures have been recorded.  I have no first-line knowledge of any pseudoseizures in her. She does have an extensive psychiatric history. She was nonfocal on her exam with the exception of giveaway weakness in both her lower extremities and sensory decrease in lower extremities. She did have an episode of generalized tonic-clonic seizure in the emergency room with loss of bladder control. I would have her admitted at [facility] for further work-up.  Impression: Breakthrough seizure in a patient with known seizure disorder Some concern about possible nonorganic etiology of some of the seizures.  Recommendations: --Keppra - increase to 1250 BID --EEG in am.  May consider doing LTM if has multiple seizures or does not return to baseline between seizure episodes.  Ideally she should have a EMU admission with LTM EEG, but not sure if that is currently possible with her incarceration. --MRI brain with and without contrast --Transfer to [facility] - might need LTM if continues to have more episodes and/or is prolonged post-ictal --Seizure precautions as documented below Neurology will continue to follow with you.  Per [facility] statutes, patients with seizures are not allowed to drive until they have been seizure-free for six months.   Use caution when using heavy equipment or power tools. Avoid working on ladders or at heights. Take showers instead of baths. Ensure the water temperature is not too high on the home water heater. Do not go swimming alone. Do not lock yourself in a room alone (i.e. bathroom). When caring for infants or small children, sit down when holding, feeding, or changing them to minimize risk of injury to the child in the event you have a seizure. Maintain good sleep hygiene. Avoid alcohol.   If patient has another seizure, call 911 and bring them back to the ED if: A. The seizure lasts longer than 5 minutes.  B. The patient doesn't wake  shortly after the seizure or has new problems such as difficulty seeing, speaking or moving following the seizure C. The patient was injured during the seizure D. The patient has a temperature over 102 F (39C) E. The patient vomited during the seizure and now is having trouble breathing  -- Milon Dikes, MD [facility] Pager: [phone] If 7pm to 7am, please call on call as listed on AMION.

## 2019-01-08 NOTE — ED Provider Notes (Signed)
[facility]-EMERGENCY DEPT Provider Note   CSN: [id number] Arrival date & time: 01/08/19  1933     History   Chief Complaint Chief Complaint  Patient presents with  . Seizures    HPI Erin Krueger is a 23 y.o. female.   HPI   23 year old female with seizures.  History of the same.  She is coming from jail where she is currently incarcerated.  Reportedly had multiple seizures prior to arrival.  Generalized seizure shortly after he came to the emergency room.  She received 2.5 mg of Versed by EMS.  On my exam she was very drowsy but would open her eyes and follow simple commands.  She was answering some simple questions.  She states that she has been getting her seizure medications. She is supposed to be on Keppra. I'm not sure how the history of her not taking her meds for the past four days came about. Two prison guards accompanied her to the ER and then a third subsequently relieved them. None of them could even tell me how long she has been incarcerated for. Pt tells me since sometime in January.   Past Medical History:  Diagnosis Date  . ADHD (attention deficit hyperactivity disorder)   . Allergy   . Anxiety   . Asthma   . Eating disorder   . Headache(784.0)   . Kidney stone    kidney stones  . Migraines   . ODD (oppositional defiant disorder)   . PID (acute pelvic inflammatory disease) 01/29/2016  . PTSD (post-traumatic stress disorder)   . Seizures [facility])    diagnosed age 70    Patient Active Problem List   Diagnosis Date Noted  . History of medication noncompliance   . Status epilepticus (HCC) 11/29/2018  . Asthma 11/29/2018  . Seizures (HCC) 08/28/2018  . Third trimester pregnancy   . Persistent UTI (urinary tract infection) during pregnancy 04/08/2018  . Marijuana use 04/07/2018  . Supervision of normal pregnancy Apr 12, 2018  . Smoker 12-Apr-2018  . Death of child Apr 12, 2018  . 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 Breakthrough seizure (HCC)  01/07/2016  . Seizure (HCC) 01/07/2016  . 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 PTSD (post-traumatic stress disorder) 05/12/2013  . 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 Polysubstance abuse (HCC) 05/12/2013    Past Surgical History:  Procedure Laterality Date  . right knee surgery    . TONSILLECTOMY AND ADENOIDECTOMY       OB History    Gravida  3   Para  2   Term  2   Preterm      AB  1   Living  1     SAB  1   TAB      Ectopic      Multiple  0   Live Births  2            Home Medications    Prior to Admission medications   Medication Sig Start Date End Date Taking? Authorizing Provider  albuterol (PROVENTIL HFA;VENTOLIN HFA) 108 (90 Base) MCG/ACT inhaler Inhale 2 puffs into the lungs every 6 (six) hours as needed for wheezing or shortness of breath. 04/12/18  Yes Cheral Marker, CNM  EPINEPHrine 0.3 mg/0.3 mL IJ SOAJ injection Inject 0.3 mLs (0.3 mg total) into the muscle as needed. 06/27/18  Yes Cheral Marker, CNM  folic acid (FOLVITE) 1 MG tablet Take 1 mg by mouth daily.   Yes [provider]  ibuprofen (ADVIL,MOTRIN) 200 MG tablet Take 800 mg by mouth daily as needed for headache.   Yes [provider]  levETIRAcetam (KEPPRA) 1000 MG tablet Take 1 tablet (1,000 mg total) by mouth 2 (two) times daily. 12/01/18 01/08/19 Yes Narda Bonds, MD  acetaminophen (TYLENOL) 500 MG tablet Take 1 tablet (500 mg total) by mouth every 6 (six) hours as needed for mild pain, moderate pain, fever or headache. Patient not taking: Reported on 01/08/2019 12/01/18   Narda Bonds, MD  ibuprofen (ADVIL,MOTRIN) 600 MG tablet Take 1 tablet (600 mg total) by mouth every 6 (six) hours as needed. Patient not taking: Reported on 01/08/2019 11/10/18   Theodore Bing, MD  Prenatal Vit-Fe Fumarate-FA (PRENATAL MULTIVITAMIN) TABS tablet Take 1 tablet by mouth daily at 12 noon. Patient not taking: Reported on  01/08/2019 04/26/18   Jacklyn Shell, CNM    Family History Family History  Problem Relation Age of Onset  . Diabetes Mother   . Diabetes Sister   . Breast cancer Maternal Grandmother   . Diabetes Maternal Grandmother   . Glaucoma Maternal Grandmother     Social History Social History   Tobacco Use  . Smoking status: Current Every Day Smoker    Types: Cigarettes, E-cigarettes  . Smokeless tobacco: Never Used  . Tobacco comment: uses e-cig daily   Substance Use Topics  . Alcohol use: Not Currently    Comment: 3-4 glassed vodka/beer(not while pregnant)  . Drug use: Not Currently    Types: Marijuana    Comment: no drug use since pregnant- cocaine last use 2018     Allergies   Bee venom; Cashew nut oil; Penicillins; Shellfish allergy; Prednisone; Cocoa butter; and Latex   Review of Systems Review of Systems  All systems reviewed and negative, other than as noted in HPI.  Physical Exam Updated Vital Signs BP 115/77 (BP Location: Right Arm)   Pulse 75   Temp 98.7 F (37.1 C) (Oral)   Resp 17   LMP 01/26/2018   SpO2 100%   Physical Exam Vitals signs and nursing note reviewed.  Constitutional:      General: She is not in acute distress.    Appearance: She is well-developed.  HENT:     Head: Normocephalic and atraumatic.  Eyes:     General:        Right eye: No discharge.        Left eye: No discharge.     Extraocular Movements: Extraocular movements intact.     Conjunctiva/sclera: Conjunctivae normal.     Pupils: Pupils are equal, round, and reactive to light.  Neck:     Musculoskeletal: Neck supple. No neck rigidity.  Cardiovascular:     Rate and Rhythm: Normal rate and regular rhythm.     Heart sounds: Normal heart sounds. No murmur. No friction rub. No gallop.   Pulmonary:     Effort: Pulmonary effort is normal. No respiratory distress.     Breath sounds: Normal breath sounds.  Abdominal:     General: There is no distension.     Palpations:  Abdomen is soft.     Tenderness: There is no abdominal tenderness.  Musculoskeletal:        General: No tenderness.  Lymphadenopathy:     Cervical: No cervical adenopathy.  Skin:    General: Skin is warm and dry.  Neurological:     Mental Status: She is alert.     Comments: Drowsy.  Opens eyes to voice.  Answer simple questions.  Follows simple commands.  She is moving all extremities equally.  Does not appear to have cranial nerve deficit.  Psychiatric:        Behavior: Behavior normal.        Thought Content: Thought content normal.      ED Treatments / Results  Labs (all labs ordered are listed, but only abnormal results are displayed) Labs Reviewed  BASIC METABOLIC PANEL - Abnormal; Notable for the following components:      Result Value   Calcium 8.7 (*)    All other components within normal limits  RAPID URINE DRUG SCREEN, HOSP PERFORMED - Abnormal; Notable for the following components:   Benzodiazepines POSITIVE (*)    All other components within normal limits  MRSA PCR SCREENING  CBC WITH DIFFERENTIAL/PLATELET  CBC WITH DIFFERENTIAL/PLATELET  CBG MONITORING, ED  I-STAT BETA HCG BLOOD, ED ([facility], [facility], [facility] ONLY)    EKG None  Radiology No results found.  Procedures Procedures (including critical care time)  CRITICAL CARE Performed by: Raeford RazorStephen Errika Narvaiz Total critical care time: 35 minutes Critical care time was exclusive of separately billable procedures and treating other patients. Critical care was necessary to treat or prevent imminent or life-threatening deterioration. Critical care was time spent personally by me on the following activities: development of treatment plan with patient and/or surrogate as well as nursing, discussions with consultants, evaluation of patient's response to treatment, examination of patient, obtaining history from patient or surrogate, ordering and performing treatments and interventions, ordering and review of laboratory studies,  ordering and review of radiographic studies, pulse oximetry and re-evaluation of patient's condition.   Medications Ordered in ED Medications  levETIRAcetam (KEPPRA) IVPB 1000 mg/100 mL premix (has no administration in time range)  levETIRAcetam (KEPPRA) IVPB 1000 mg/100 mL premix (0 mg Intravenous Stopped 01/08/19 2127)  LORazepam (ATIVAN) injection 1 mg (1 mg Intravenous Given 01/08/19 2023)  LORazepam (ATIVAN) injection 1 mg (1 mg Intravenous Given 01/08/19 2028)  LORazepam (ATIVAN) injection 1 mg (1 mg Intravenous Given 01/08/19 2128)     Initial Impression / Assessment and Plan / ED Course  I have reviewed the triage vital signs and the nursing notes.  Pertinent labs & imaging results that were available during my care of the patient were reviewed by me and considered in my medical decision making (see chart for details).     22yF with seizures. 2.5mg  versed by EMS. Given 1mg  of ativan and 1,00mg  of keppra initially. Subsequently another seizure. Given additional ativan. Unclear if actually compliant with meds or not. Additional 1 g of keppra given. Admit.   Final Clinical Impressions(s) / ED Diagnoses   Final diagnoses:  Status epilepticus [facility](HCC)    ED Discharge Orders    None       Raeford RazorKohut, Traevon Meiring, MD 01/09/19 [phone]

## 2019-01-08 NOTE — ED Triage Notes (Signed)
Per EMS, Pt is coming from jail. Pt had apprx 10 seizures, about 2-5 mins between seizures. Pt has not had any seizure meds for minimum of 4 days, unknown if pt was taking meds before jail. Pt A&O 0x, responds to every question by shaking her head yes. Pt received 2.5 MG of versed by EMS.

## 2019-01-08 NOTE — H&P (Signed)
History and Physical    Erin Krueger ZOX:[id number] DOB: September 13, 1996 DOA: 01/08/2019  PCP: [facility]  Patient coming from: [facility] was [facility].  Chief Complaint: Seizures.  HPI: Erin Krueger is a 23 y.o. female with history of seizures ADHD presently incarcerated was brought to the ER after patient had multiple episodes of generalized tonic-clonic seizure at the present.  In the ER patient had 2 episodes lasting less than a minute each following which patient became postictal.  Each episode of 1 hour apart.  Patient was postictal and during the episode in the present patient did bite her lips.  Also had incontinence of urine.  Patient states she has been compliant with her medication.  Patient was given Versed 2.5 IV by the EMS.  ED Course: In the ER patient following the first seizure had a gram of Keppra ordered since there is some concern about her compliance another gram was ordered after the second seizure.  Neurology has been consulted.  At the time of my exam patient is alert awake oriented to time place and person and moving all extremities.  Review of Systems: As per HPI, rest all negative.   Past Medical History:  Diagnosis Date  . ADHD (attention deficit hyperactivity disorder)   . Allergy   . Anxiety   . Asthma   . Eating disorder   . Headache(784.0)   . Kidney stone    kidney stones  . Migraines   . ODD (oppositional defiant disorder)   . PID (acute pelvic inflammatory disease) 01/29/2016  . PTSD (post-traumatic stress disorder)   . Seizures (HCC)    diagnosed age 42    Past Surgical History:  Procedure Laterality Date  . right knee surgery    . TONSILLECTOMY AND ADENOIDECTOMY       reports that she has been smoking cigarettes and e-cigarettes. She has never used smokeless tobacco. She reports previous alcohol use. She reports previous drug use. Drug: Marijuana.  Allergies  Allergen Reactions  . Bee Venom  Anaphylaxis  . Cashew Nut Oil Anaphylaxis  . Penicillins Anaphylaxis    Has patient had a PCN reaction causing immediate rash, facial/tongue/throat swelling, SOB or lightheadedness with hypotension: Unknown Has patient had a PCN reaction causing severe rash involving mucus membranes or skin necrosis: Unknown Has patient had a PCN reaction that required hospitalization: Unknown Has patient had a PCN reaction occurring within the last 10 years: Unknown If all of the above answers are "NO", then may proceed with Cephalosporin use.   . Shellfish Allergy Anaphylaxis, Hives and Rash  . Prednisone Other (See Comments)    Bleeding nose bleeding and internal bleeding  . Cocoa Butter Rash  . Latex Rash    Family History  Problem Relation Age of Onset  . Diabetes Mother   . Diabetes Sister   . Breast cancer Maternal Grandmother   . Diabetes Maternal Grandmother   . Glaucoma Maternal Grandmother     Prior to Admission medications   Medication Sig Start Date End Date Taking? Authorizing Provider  albuterol (PROVENTIL HFA;VENTOLIN HFA) 108 (90 Base) MCG/ACT inhaler Inhale 2 puffs into the lungs every 6 (six) hours as needed for wheezing or shortness of breath. 04/05/18  Yes Cheral Marker, CNM  EPINEPHrine 0.3 mg/0.3 mL IJ SOAJ injection Inject 0.3 mLs (0.3 mg total) into the muscle as needed. 06/27/18  Yes Cheral Marker, CNM  folic acid (FOLVITE) 1 MG tablet Take 1 mg by  mouth daily.   Yes [provider]  ibuprofen (ADVIL,MOTRIN) 200 MG tablet Take 800 mg by mouth daily as needed for headache.   Yes [provider]  levETIRAcetam (KEPPRA) 1000 MG tablet Take 1 tablet (1,000 mg total) by mouth 2 (two) times daily. 12/01/18 01/08/19 Yes Narda BondsNettey, Ralph A, MD  acetaminophen (TYLENOL) 500 MG tablet Take 1 tablet (500 mg total) by mouth every 6 (six) hours as needed for mild pain, moderate pain, fever or headache. Patient not taking: Reported on 01/08/2019 12/01/18   Narda BondsNettey, Ralph A,  MD  ibuprofen (ADVIL,MOTRIN) 600 MG tablet Take 1 tablet (600 mg total) by mouth every 6 (six) hours as needed. Patient not taking: Reported on 01/08/2019 11/10/18   [place] BingPickens, Charlie, MD  Prenatal Vit-Fe Fumarate-FA (PRENATAL MULTIVITAMIN) TABS tablet Take 1 tablet by mouth daily at 12 noon. Patient not taking: Reported on 01/08/2019 04/26/18   Jacklyn Shellresenzo-Dishmon, Frances, CNM    Physical Exam: Vitals:   01/08/19 2000 01/08/19 2030 01/08/19 2100 01/08/19 2130  BP: 118/79 (!) 121/97 108/68 114/84  Pulse: 71 89 72 76  Resp: 20 (!) 22 16 18   Temp:      TempSrc:      SpO2: 100% 100% 99% 100%      Constitutional: Moderately built and nourished. Vitals:   01/08/19 2000 01/08/19 2030 01/08/19 2100 01/08/19 2130  BP: 118/79 (!) 121/97 108/68 114/84  Pulse: 71 89 72 76  Resp: 20 (!) 22 16 18   Temp:      TempSrc:      SpO2: 100% 100% 99% 100%   Eyes: Anicteric no pallor. ENMT: No discharge from the ears eyes nose and mouth. Neck: No mass felt.  No neck rigidity. Respiratory: No rhonchi or crepitations. Cardiovascular: S1-S2 heard. Abdomen: Soft nontender bowel sounds present. Musculoskeletal: No edema.  No joint effusion. Skin: No rash. Neurologic: Alert awake oriented to time place and person.  Moves all extremities. Psychiatric: Appears normal.  Normal affect.   Labs on Admission: I have personally reviewed following labs and imaging studies  CBC: Recent Labs  Lab 01/08/19 2011  WBC 6.5  NEUTROABS 3.5  HGB 12.2  HCT 39.5  MCV 90.0  PLT 197   Basic Metabolic Panel: Recent Labs  Lab 01/08/19 2011  NA 141  K 3.5  CL 109  CO2 25  GLUCOSE 98  BUN 8  CREATININE 0.69  CALCIUM 8.7*   GFR: CrCl cannot be calculated (Unknown ideal weight.). Liver Function Tests: No results for input(s): AST, ALT, ALKPHOS, BILITOT, PROT, ALBUMIN in the last 168 hours. No results for input(s): LIPASE, AMYLASE in the last 168 hours. No results for input(s): AMMONIA in the last 168  hours. Coagulation Profile: No results for input(s): INR, PROTIME in the last 168 hours. Cardiac Enzymes: No results for input(s): CKTOTAL, CKMB, CKMBINDEX, TROPONINI in the last 168 hours. BNP (last 3 results) No results for input(s): PROBNP in the last 8760 hours. HbA1C: No results for input(s): HGBA1C in the last 72 hours. CBG: Recent Labs  Lab 01/08/19 2002  GLUCAP 92   Lipid Profile: No results for input(s): CHOL, HDL, LDLCALC, TRIG, CHOLHDL, LDLDIRECT in the last 72 hours. Thyroid Function Tests: No results for input(s): TSH, T4TOTAL, FREET4, T3FREE, THYROIDAB in the last 72 hours. Anemia Panel: No results for input(s): VITAMINB12, FOLATE, FERRITIN, TIBC, IRON, RETICCTPCT in the last 72 hours. Urine analysis:    Component Value Date/Time   COLORURINE YELLOW 11/29/2018 2315   APPEARANCEUR CLEAR 11/29/2018 2315  APPEARANCEUR Cloudy (A) 04/05/2018 1533   LABSPEC 1.015 11/29/2018 2315   PHURINE 6.0 11/29/2018 2315   GLUCOSEU NEGATIVE 11/29/2018 2315   HGBUR LARGE (A) 11/29/2018 2315   BILIRUBINUR NEGATIVE 11/29/2018 2315   BILIRUBINUR Negative 04/05/2018 1533   KETONESUR 5 (A) 11/29/2018 2315   PROTEINUR NEGATIVE 11/29/2018 2315   UROBILINOGEN 0.2 03/30/2015 2201   NITRITE NEGATIVE 11/29/2018 2315   LEUKOCYTESUR TRACE (A) 11/29/2018 2315   LEUKOCYTESUR 2+ (A) 04/05/2018 1533   Sepsis Labs: @LABRCNTIP (procalcitonin:4,lacticidven:4) )No results found for this or any previous visit (from the past 240 hour(s)).   Radiological Exams on Admission: No results found.   Assessment/Plan Principal Problem:   Status epilepticus (HCC) Active Problems:   Seizure (HCC)   Asthma    1. Seizures multiple episodes -discussed with neurologist Dr. Jerrell Belfast who at this time advised to increase the Keppra dose to 1250 mg IV every 12 hours I have discussed with pharmacy.  Get MRI of brain with and without contrast.  Closely observe. 2. History of asthma presently not  wheezing. 3. History of ADHD presently not on medication.   DVT prophylaxis: SCDs. Code Status: Full code. Family Communication: Discussed with patient. Disposition Plan: To be determined. Consults called: Neurologist. Admission status: Observation.   Eduard Clos MD [facility] Pager [phone].  If 7PM-7AM, please contact night-coverage [email or web address] Password [facility]  01/08/2019, 10:06 PM

## 2019-01-08 NOTE — ED Notes (Signed)
Pt began having a seizure. MD aware and ordered 1 mg of Ativan

## 2019-01-08 NOTE — ED Notes (Signed)
Bed: VK18 Expected date:  Expected time:  Means of arrival:  Comments: EMS: 23 y/o female, seizures, from jail

## 2019-01-09 ENCOUNTER — Observation Stay (HOSPITAL_COMMUNITY)

## 2019-01-09 DIAGNOSIS — G40401 Other generalized epilepsy and epileptic syndromes, not intractable, with status epilepticus: Secondary | ICD-10-CM | POA: Diagnosis not present

## 2019-01-09 DIAGNOSIS — F509 Eating disorder, unspecified: Secondary | ICD-10-CM | POA: Diagnosis not present

## 2019-01-09 DIAGNOSIS — F319 Bipolar disorder, unspecified: Secondary | ICD-10-CM | POA: Diagnosis not present

## 2019-01-09 DIAGNOSIS — F17210 Nicotine dependence, cigarettes, uncomplicated: Secondary | ICD-10-CM | POA: Diagnosis not present

## 2019-01-09 DIAGNOSIS — Z79899 Other long term (current) drug therapy: Secondary | ICD-10-CM | POA: Diagnosis not present

## 2019-01-09 DIAGNOSIS — R51 Headache: Secondary | ICD-10-CM | POA: Diagnosis not present

## 2019-01-09 DIAGNOSIS — Z791 Long term (current) use of non-steroidal anti-inflammatories (NSAID): Secondary | ICD-10-CM | POA: Diagnosis not present

## 2019-01-09 DIAGNOSIS — F913 Oppositional defiant disorder: Secondary | ICD-10-CM | POA: Diagnosis not present

## 2019-01-09 DIAGNOSIS — Z888 Allergy status to other drugs, medicaments and biological substances status: Secondary | ICD-10-CM | POA: Diagnosis not present

## 2019-01-09 DIAGNOSIS — R531 Weakness: Secondary | ICD-10-CM | POA: Diagnosis not present

## 2019-01-09 DIAGNOSIS — R569 Unspecified convulsions: Secondary | ICD-10-CM | POA: Diagnosis present

## 2019-01-09 DIAGNOSIS — G40901 Epilepsy, unspecified, not intractable, with status epilepticus: Secondary | ICD-10-CM | POA: Diagnosis not present

## 2019-01-09 DIAGNOSIS — J45909 Unspecified asthma, uncomplicated: Secondary | ICD-10-CM | POA: Diagnosis not present

## 2019-01-09 DIAGNOSIS — Z880 Allergy status to penicillin: Secondary | ICD-10-CM | POA: Diagnosis not present

## 2019-01-09 DIAGNOSIS — F909 Attention-deficit hyperactivity disorder, unspecified type: Secondary | ICD-10-CM | POA: Diagnosis not present

## 2019-01-09 LAB — MRSA PCR SCREENING: MRSA by PCR: NEGATIVE

## 2019-01-09 MED ORDER — SODIUM CHLORIDE 0.9 % IV SOLN
1250.0000 mg | Freq: Two times a day (BID) | INTRAVENOUS | Status: DC
  Administered 2019-01-09 – 2019-01-10 (×2): 1250 mg via INTRAVENOUS
  Filled 2019-01-09 (×5): qty 12.5

## 2019-01-09 MED ORDER — SODIUM CHLORIDE 0.9 % IV SOLN
250.0000 mg | Freq: Once | INTRAVENOUS | Status: AC
  Administered 2019-01-09: 250 mg via INTRAVENOUS
  Filled 2019-01-09: qty 2.5

## 2019-01-09 NOTE — Progress Notes (Signed)
Subjective: Nurse reports that just before I walked, she was responsive.   Exam: Vitals:   01/09/19 0135 01/09/19 0727  BP: 106/66 93/61  Pulse: 68 (!) 49  Resp: 14   Temp:  98.1 F (36.7 C)  SpO2:  99%   Gen: In bed, NAD Resp: non-labored breathing, no acute distress Abd: soft, nt  Neuro: MS: Does not open eyes or follow commands.  CN: Pupils reactive bilaterally, eyes midline, does not fixate or track.  Motor: When her hand is held in front of her face, she does avoid hitting her face, but no movement otherwise to nox stim.  Sensory:no response to nox stim.   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 I suspect that this represents the latter category. We will connect to EEG to try to capture events  Recommendations: 1) EEG monitoring 2) Keppra 1250mg  IV being given now, continue at q12h 3) will follow.   Ritta Slot, MD [facility] [phone]  If 7pm- 7am, please page neurology on call as listed in AMION.

## 2019-01-09 NOTE — Procedures (Signed)
ELECTROENCEPHALOGRAM REPORT   Patient: Erin Krueger       Room #: 4NP01C EEG No. ID: [id number] Age: 23 y.o.        Sex: female Referring Physician: Renford Dills Report Date:  01/09/2019        Interpreting Physician: Thana Farr  History: Erin Krueger is an 23 y.o. female with a history of seizures presenting with breakthrough seizures  Medications:  Folic Acid, Keppra  Conditions of Recording:  This is a 21 channel routine scalp EEG performed with bipolar and monopolar montages arranged in accordance to the international 10/20 system of electrode placement. One channel was dedicated to EKG recording.  The patient is in the awake, drowsy and asleep states.  Description:  The waking background activity consists of a low voltage, symmetrical, fairly well organized, 9-10 Hz alpha activity, seen from the parieto-occipital and posterior temporal regions.  Low voltage fast activity, poorly organized, is seen anteriorly and is at times superimposed on more posterior regions.  A mixture of theta and alpha rhythms are seen from the central and temporal regions. The patient drowses with slowing to irregular, low voltage theta and beta activity.   The patient goes in to a light sleep with symmetrical sleep spindles, vertex central sharp transients and irregular slow activity.  No epileptiform activity is noted.   Hyperventilation was not performed.  Intermittent photic stimulation was performed and elicits a symmetrical driving response but fails to elicit any abnormalities.  IMPRESSION: Normal electroencephalogram, awake, asleep and with activation procedures. There are no focal lateralizing or epileptiform features.   Thana Farr, MD Neurology [phone] 01/09/2019, 1:05 PM

## 2019-01-09 NOTE — Progress Notes (Signed)
Pt has not had any episodes since earlier today. Continuous EEG monitoring in progress. Pt is in custody of police with officer present and both ankles with handcuffs and chains on ankles. Care was taken to provide coverage and privacy upon bathing with officer in room this am. Gabriel Cirri RN

## 2019-01-09 NOTE — Progress Notes (Signed)
vLTM EEG running. No skin breakdown.  

## 2019-01-09 NOTE — Progress Notes (Signed)
EEG completed, results pending. 

## 2019-01-09 NOTE — ED Notes (Signed)
ED TO INPATIENT HANDOFF REPORT  Name/Age/Gender Erin BertholdWillow Krueger 23 y.o. female  Code Status    Code Status Orders  (From admission, onward)         Start     Ordered   01/08/19 2203  Full code  Continuous     01/08/19 2206        Code Status History    Date Active Date Inactive Code Status Order ID Comments User Context   11/29/2018 2316 12/01/2018 1533 Full Code [id number]  Bobette Mortiz, David Manuel, MD ED   11/08/2018 1057 11/10/2018 1809 Full Code [id number]  Arvilla MarketWallace, Catherine Lauren, DO Inpatient   11/07/2018 2027 11/08/2018 1057 Full Code [id number]  Arvilla MarketWallace, Catherine Lauren, DO Inpatient   08/28/2018 0546 08/30/2018 0017 Full Code [id number]  Lovena Neighboursiallo, Abdoulaye, MD ED   11/17/2016 2358 11/20/2016 1702 Full Code [id number]  Bobette Mortiz, David Manuel, MD Inpatient   01/29/2016 2306 01/31/2016 2007 Full Code [id number]  Tilda BurrowFerguson, John V, MD Inpatient   01/07/2016 2208 01/09/2016 1534 Full Code [id number]  Houston SirenLe, Peter, MD Inpatient   09/24/2014 2217 10/11/2014 1646 Full Code [id number]  Lawyer, Jamesetta Orleanshristopher W, PA-C ED      Home/SNF/Other Given to floor  Chief Complaint seizure   Level of Care/Admitting Diagnosis ED Disposition    ED Disposition Condition Comment   Admit  [facility] [100100[facility]  Level of Care: Progressive [102]  I expect the patient will be discharged within 24 hours: No (not a candidate for 5C-Observation unit)  Diagnosis: Seizure (HCC) [205090]  Admitting Physician: Eduard ClosKAKRAKANDY, ARSHAD N [phone][[id number]]  Attending Physician: Eduard ClosKAKRAKANDY, ARSHAD N Florian.Pax[[id number]]  PT Class (Do Not Modify): Observation [104]  PT Acc Code (Do Not Modify): Observation [10022]       Medical History Past Medical History:  Diagnosis Date  . ADHD (attention deficit hyperactivity disorder)   . Allergy   . Anxiety   . Asthma   . Eating disorder   . Headache(784.0)   . Kidney stone    kidney stones  . Migraines   . ODD (oppositional defiant disorder)   . PID (acute pelvic  inflammatory disease) 01/29/2016  . PTSD (post-traumatic stress disorder)   . Seizures (HCC)    diagnosed age 89    Allergies Allergies  Allergen Reactions  . Bee Venom Anaphylaxis  . Cashew Nut Oil Anaphylaxis  . Penicillins Anaphylaxis    Has patient had a PCN reaction causing immediate rash, facial/tongue/throat swelling, SOB or lightheadedness with hypotension: Unknown Has patient had a PCN reaction causing severe rash involving mucus membranes or skin necrosis: Unknown Has patient had a PCN reaction that required hospitalization: Unknown Has patient had a PCN reaction occurring within the last 10 years: Unknown If all of the above answers are "NO", then may proceed with Cephalosporin use.   . Shellfish Allergy Anaphylaxis, Hives and Rash  . Prednisone Other (See Comments)    Bleeding nose bleeding and internal bleeding  . Cocoa Butter Rash  . Latex Rash    IV Location/Drains/Wounds Patient Lines/Drains/Airways Status   Active Line/Drains/Airways    Name:   Placement date:   Placement time:   Site:   Days:   Peripheral IV 11/29/18 Right Antecubital   11/29/18    2100    Antecubital   41   Peripheral IV 01/08/19 Left Antecubital   01/08/19    1933    Antecubital   1          Labs/Imaging Results for orders placed or  performed during the hospital encounter of 01/08/19 (from the past 48 hour(s))  CBG monitoring, ED     Status: None   Collection Time: 01/08/19  8:02 PM  Result Value Ref Range   Glucose-Capillary 92 70 - 99 mg/dL  I-Stat beta hCG blood, ED     Status: None   Collection Time: 01/08/19  8:09 PM  Result Value Ref Range   I-stat hCG, quantitative <5.0 <5 mIU/mL   Comment 3            Comment:   GEST. AGE      CONC.  (mIU/mL)   <=1 WEEK        5 - 50     2 WEEKS       50 - 500     3 WEEKS       100 - 10,000     4 WEEKS     1,000 - 30,000        FEMALE AND NON-PREGNANT FEMALE:     LESS THAN 5 mIU/mL   Basic metabolic panel     Status: Abnormal    Collection Time: 01/08/19  8:11 PM  Result Value Ref Range   Sodium 141 135 - 145 mmol/L   Potassium 3.5 3.5 - 5.1 mmol/L   Chloride 109 98 - 111 mmol/L   CO2 25 22 - 32 mmol/L   Glucose, Bld 98 70 - 99 mg/dL   BUN 8 6 - 20 mg/dL   Creatinine, Ser 1.61 0.44 - 1.00 mg/dL   Calcium 8.7 (L) 8.9 - 10.3 mg/dL   GFR calc non Af Amer >60 >60 mL/min   GFR calc Af Amer >60 >60 mL/min   Anion gap 7 5 - 15    Comment: Performed at [facility], [address], [place], [place] [address]  CBC with Differential     Status: None   Collection Time: 01/08/19  8:11 PM  Result Value Ref Range   WBC 6.5 4.0 - 10.5 K/uL   RBC 4.39 3.87 - 5.11 MIL/uL   Hemoglobin 12.2 12.0 - 15.0 g/dL   HCT 54.0 98.1 - 19.1 %   MCV 90.0 80.0 - 100.0 fL   MCH 27.8 26.0 - 34.0 pg   MCHC 30.9 30.0 - 36.0 g/dL   RDW 47.8 29.5 - 62.1 %   Platelets 197 150 - 400 K/uL   nRBC 0.0 0.0 - 0.2 %   Neutrophils Relative % 53 %   Neutro Abs 3.5 1.7 - 7.7 K/uL   Lymphocytes Relative 35 %   Lymphs Abs 2.3 0.7 - 4.0 K/uL   Monocytes Relative 8 %   Monocytes Absolute 0.5 0.1 - 1.0 K/uL   Eosinophils Relative 3 %   Eosinophils Absolute 0.2 0.0 - 0.5 K/uL   Basophils Relative 1 %   Basophils Absolute 0.0 0.0 - 0.1 K/uL   Immature Granulocytes 0 %   Abs Immature Granulocytes 0.01 0.00 - 0.07 K/uL    Comment: Performed at [facility], [address], [place], [place] [address]  Rapid urine drug screen (hospital performed)     Status: Abnormal   Collection Time: 01/08/19  9:48 PM  Result Value Ref Range   Opiates NONE DETECTED NONE DETECTED   Cocaine NONE DETECTED NONE DETECTED   Benzodiazepines POSITIVE (A) NONE DETECTED   Amphetamines NONE DETECTED NONE DETECTED   Tetrahydrocannabinol NONE DETECTED NONE DETECTED   Barbiturates NONE DETECTED NONE DETECTED    Comment: (  NOTE) DRUG SCREEN FOR MEDICAL PURPOSES ONLY.  IF CONFIRMATION IS NEEDED FOR ANY PURPOSE, NOTIFY LAB WITHIN 5  DAYS. LOWEST DETECTABLE LIMITS FOR URINE DRUG SCREEN Drug Class                     Cutoff (ng/mL) Amphetamine and metabolites    1000 Barbiturate and metabolites    200 Benzodiazepine                 200 Tricyclics and metabolites     300 Opiates and metabolites        300 Cocaine and metabolites        300 THC                            50 Performed at [facility], [address], [place], [place] [address]    No results found.  Pending Labs Unresulted Labs (From admission, onward)    Start     Ordered   01/09/19 0500  Basic metabolic panel  Tomorrow morning,   R     01/08/19 2206   01/09/19 0500  Hepatic function panel  Tomorrow morning,   R     01/08/19 2206   01/09/19 0500  CBC WITH DIFFERENTIAL  Tomorrow morning,   R     01/08/19 2206   01/09/19 0500  Magnesium  Tomorrow morning,   R     01/08/19 2206          Vitals/Pain Today's Vitals   01/08/19 2000 01/08/19 2030 01/08/19 2100 01/08/19 2130  BP: 118/79 (!) 121/97 108/68 114/84  Pulse: 71 89 72 76  Resp: 20 (!) 22 16 18   Temp:      TempSrc:      SpO2: 100% 100% 99% 100%    Isolation Precautions No active isolations  Medications Medications  folic acid (FOLVITE) tablet 1 mg (has no administration in time range)  acetaminophen (TYLENOL) tablet 650 mg (has no administration in time range)    Or  acetaminophen (TYLENOL) suppository 650 mg (has no administration in time range)  ondansetron (ZOFRAN) tablet 4 mg (has no administration in time range)    Or  ondansetron (ZOFRAN) injection 4 mg (has no administration in time range)  0.9 %  sodium chloride infusion (has no administration in time range)  LORazepam (ATIVAN) injection 1 mg (has no administration in time range)  levETIRAcetam (KEPPRA) IVPB 1000 mg/100 mL premix (has no administration in time range)  albuterol (PROVENTIL) (2.5 MG/3ML) 0.083% nebulizer solution 2.5 mg (has no administration in time range)  levETIRAcetam (KEPPRA)  IVPB 1000 mg/100 mL premix (0 mg Intravenous Stopped 01/08/19 2127)  LORazepam (ATIVAN) injection 1 mg (1 mg Intravenous Given 01/08/19 2023)  LORazepam (ATIVAN) injection 1 mg (1 mg Intravenous Given 01/08/19 2028)  LORazepam (ATIVAN) injection 1 mg (1 mg Intravenous Given 01/08/19 2128)  levETIRAcetam (KEPPRA) IVPB 1000 mg/100 mL premix (0 mg Intravenous Stopped 01/08/19 2231)    Mobility non-ambulatory

## 2019-01-09 NOTE — Progress Notes (Signed)
PROGRESS NOTE    Erin Krueger  RSW:[id number] DOB: [birth year] DOA: 01/08/2019 PCP: [facility]   Brief Narrative: Patient is a 23 year old female with history of seizure, ADHD, bipolar disorder who is presently incarcerated for assault, brought to the emergency department with report of multiple episodes of generalized tonic-clonic seizures.  In the emergency department she had 2 episodes lasting less than a minute.  Also had incontinence of urine.  Patient has history of seizure and she is taking seizure medication Keppra at home.  Neurology consulted.  Planning for EEG today.  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  Assessment & Plan:   Principal Problem:   Status epilepticus (HCC) Active Problems:   Seizure (HCC)   Asthma   History of seizures/seizures versus pseudoseizures: Reported to have multiple episodes of generalized tonic-clonic seizures.  Also had 2 seizure episodes in the emergency department.  Was reported that she was briefly unresponsive earlier this morning.  Neurology suspecting pseudoseizures.  EEG has been ordered.  Started on Keppra. Currently she is alert and oriented.  Hemodynamically stable. Neurology also recommended MRI of the brain. Continue seizure precaution.  History of asthma: Currently stable  History of ADHD: Not on medications  History of bipolar disorder: Not on medications.  Currently mood stable.         DVT prophylaxis: SCD Code Status: Full Family Communication: None present at the bedside Disposition Plan: Back to incarceration after clearance from neurology   Consultants: Neurology  Procedures: EEG  Antimicrobials: None Anti-infectives (From admission, onward)   None      Subjective: Patient seen and examined at bedside this morning.  Hemodynamically stable.  Reported to be briefly unresponsive before I saw her.  During my evaluation, she was alert and oriented.  Not postictal.  No   neurological deficits  Objective: Vitals:   01/08/19 2130 01/09/19 0034 01/09/19 0135 01/09/19 0727  BP: 114/84 (!) 114/57 106/66 93/61  Pulse: 76 63 68 (!) 49  Resp: 18 20 14    Temp:    98.1 F (36.7 C)  TempSrc:    Oral  SpO2: 100% 98%  99%  Weight:   75.6 kg   Height:   5\' 7"  (1.702 m)     Intake/Output Summary (Last 24 hours) at 01/09/2019 1042 Last data filed at 01/09/2019 0900 Gross per 24 hour  Intake 200 ml  Output -  Net 200 ml   Filed Weights   01/09/19 0135  Weight: 75.6 kg    Examination:  General exam: Appears calm and comfortable ,Not in distress,average built HEENT:PERRL,Oral mucosa moist, Ear/Nose normal on gross exam Respiratory system: Bilateral equal air entry, normal vesicular breath sounds, no wheezes or crackles  Cardiovascular system: S1 & S2 heard, RRR. No JVD, murmurs, rubs, gallops or clicks. No pedal edema. Gastrointestinal system: Abdomen is nondistended, soft and nontender. No organomegaly or masses felt. Normal bowel sounds heard. Central nervous system: Alert and oriented. No focal neurological deficits. Extremities: No edema, no clubbing ,no cyanosis, distal peripheral pulses palpable. Skin: No rashes, lesions or ulcers,no icterus ,no pallor MSK: Normal muscle bulk,tone ,power Psychiatry: Judgement and insight appear normal. Mood & affect appropriate.     Data Reviewed: I have personally reviewed following labs and imaging studies  CBC: Recent Labs  Lab 01/08/19 2011  WBC 6.5  NEUTROABS 3.5  HGB 12.2  HCT 39.5  MCV 90.0  PLT 197   Basic Metabolic Panel: Recent Labs  Lab 01/08/19 2011  NA 141  K 3.5  CL 109  CO2 25  GLUCOSE 98  BUN 8  CREATININE 0.69  CALCIUM 8.7*   GFR: Estimated Creatinine Clearance: 118 mL/min (by C-G formula based on SCr of 0.69 mg/dL). Liver Function Tests: No results for input(s): AST, ALT, ALKPHOS, BILITOT, PROT, ALBUMIN in the last 168 hours. No results for input(s): LIPASE, AMYLASE in the  last 168 hours. No results for input(s): AMMONIA in the last 168 hours. Coagulation Profile: No results for input(s): INR, PROTIME in the last 168 hours. Cardiac Enzymes: No results for input(s): CKTOTAL, CKMB, CKMBINDEX, TROPONINI in the last 168 hours. BNP (last 3 results) No results for input(s): PROBNP in the last 8760 hours. HbA1C: No results for input(s): HGBA1C in the last 72 hours. CBG: Recent Labs  Lab 01/08/19 2002  GLUCAP 92   Lipid Profile: No results for input(s): CHOL, HDL, LDLCALC, TRIG, CHOLHDL, LDLDIRECT in the last 72 hours. Thyroid Function Tests: No results for input(s): TSH, T4TOTAL, FREET4, T3FREE, THYROIDAB in the last 72 hours. Anemia Panel: No results for input(s): VITAMINB12, FOLATE, FERRITIN, TIBC, IRON, RETICCTPCT in the last 72 hours. Sepsis Labs: No results for input(s): PROCALCITON, LATICACIDVEN in the last 168 hours.  Recent Results (from the past 240 hour(s))  MRSA PCR Screening     Status: None   Collection Time: 01/09/19  1:50 AM  Result Value Ref Range Status   MRSA by PCR NEGATIVE NEGATIVE Final    Comment:        The GeneXpert MRSA Assay (FDA approved for NASAL specimens only), is one component of a comprehensive MRSA colonization surveillance program. It is not intended to diagnose MRSA infection nor to guide or monitor treatment for MRSA infections. Performed at [facility], [address], [place], [place] [address]          Radiology Studies: No results found.      Scheduled Meds: . folic acid  1 mg Oral Daily   Continuous Infusions: . sodium chloride 75 mL/hr at 01/09/19 0134  . levETIRAcetam       LOS: 0 days    Time spent: 35 mins.More than 50% of that time was spent in counseling and/or coordination of care.      Burnadette Pop, MD [facility] Pager [phone]  If 7PM-7AM, please contact night-coverage [email or web address] Password [facility] 01/09/2019, 10:42 AM

## 2019-01-10 ENCOUNTER — Encounter (HOSPITAL_COMMUNITY): Payer: Self-pay

## 2019-01-10 DIAGNOSIS — G40901 Epilepsy, unspecified, not intractable, with status epilepticus: Secondary | ICD-10-CM | POA: Diagnosis not present

## 2019-01-10 DIAGNOSIS — R569 Unspecified convulsions: Secondary | ICD-10-CM | POA: Diagnosis not present

## 2019-01-10 MED ORDER — LEVETIRACETAM 1000 MG PO TABS: 1000.0000 mg | ORAL_TABLET | Freq: Two times a day (BID) | ORAL | 0 refills | Status: DC

## 2019-01-10 MED ORDER — LEVETIRACETAM 500 MG PO TABS
1000.0000 mg | ORAL_TABLET | Freq: Once | ORAL | Status: AC
  Administered 2019-01-10: 1000 mg via ORAL
  Filled 2019-01-10: qty 2
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
## 2019-01-10 NOTE — Progress Notes (Signed)
No spells since being connected to continuous EEG.  Given the frequency of the spells just have not of prior to connection of the EEG, as well as the character of the spells, I am concerned that this represents malingering.  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  On exam, she is awake, alert, interactive and appropriate.  At this time, no further spells for over 24 hours, okay to discharge back to jail on current dose of Keppra.  Ritta Slot, MD [facility] [phone]  If 7pm- 7am, please page neurology on call as listed in AMION.

## 2019-01-10 NOTE — Progress Notes (Signed)
LTM EEG checked, no skin breakdown seen. Cz, Pz, C4, A1 and A2 all had paste added and tape placed over electrodes

## 2019-01-10 NOTE — Procedures (Signed)
Electroencephalogram report- LTM  Ordering Physician : Dr. Amada Jupiter    Beginning date or time: 01/09/2019 10:11 hours to 01/09/2019 20:26 hours; then 01/10/2019 0811 hours to 01/10/2019 1824 hours  Day of study: day 1  Medications include: Per EMR  MENTAL STATUS (per technician's notes): Alert and oriented  HISTORY: This 24 hours of intensive EEG monitoring with simultaneous video monitoring was performed for this patient with seizure-like episodes.  The EEG was done to characterize the episodes to guide medical management.  TECHNICAL DESCRIPTION:  The study consists of a continuous 16-channel multi-montage digital video EEG recording with twenty-one electrodes placed according to the International 10-20 System. Additional leads included eye leads, true temporal leads (T1, T2), and an EKG lead. Activation procedures were not done.  REPORT: The background activity in this tracing consisted of 9 to 10 Hz posterior dominant rhythm.  The activity was symmetric bilaterally and reactive to eye opening and stimulation.  Drowsiness and sleep are manifested by background fragmentation, vertex waves and sleep spindles.  No focal slowing or epileptiform activity was seen.  There were no pushbutton events during the recording.  IMPRESSION: This EEG shows: No focal or paroxysmal activity  CLINICAL CORRELATION: This is a normal EEG.  The nature of the patient's episodes is unclear as none were recorded during the monitoring.

## 2019-01-10 NOTE — Discharge Summary (Signed)
Physician Discharge Summary  Lincoln ZOX:[id number] DOB: [birth year] DOA: 01/08/2019  PCP: [facility]  Admit date: 01/08/2019 Discharge date: 01/10/2019  Admitted From: Home Disposition:  Home  Discharge Condition:Stable CODE STATUS:FULL, Diet recommendation: Regular   Brief/Interim Summary: Patient is a 23 year old female with history of seizure, ADHD, bipolar disorder who is presently incarcerated for assault, brought to the emergency department with report of multiple episodes of generalized tonic-clonic seizures.  In the emergency department she had 2 episodes lasting less than a minute.  Also had incontinence of urine.  Patient has history of seizure and she is taking seizure medication Keppra at home. Neurology consulted.Underwent EEG and  LTM EEG with no finding of seizure activity.  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  She will be  discharged back to jail today.  Neurology cleared her for discharge.  Following Problems were addressed during her hospitalization: History of seizures/seizures versus pseudoseizures: Reported to have multiple episodes of generalized tonic-clonic seizures.  Also had 2 seizure episodes in the emergency department.  Was reported that she was briefly unresponsive yesterday.  Neurology suspecting malingering/ pseudoseizures.  EEG /LTM EEG did not show seizure activity.   Continue Keppra at previous dose.  History of asthma: Currently stable  History of ADHD: Not on medications  History of bipolar disorder: Not on medications.  Currently mood stable.Follow up with psychiatry as an outpatient.   Discharge Diagnoses:  Principal Problem:   Status epilepticus [facility]) Active Problems:   Seizure (HCC)   Asthma    Discharge Instructions  Discharge Instructions    Diet - low sodium heart healthy   Complete by:  As directed    Discharge instructions   Complete by:  As directed    1)Continue taking Keppra    Increase activity slowly   Complete by:  As directed      Allergies as of 01/10/2019      Reactions   Bee Venom Anaphylaxis   Cashew Nut Oil Anaphylaxis   Penicillins Anaphylaxis   Has patient had a PCN reaction causing immediate rash, facial/tongue/throat swelling, SOB or lightheadedness with hypotension: Unknown Has patient had a PCN reaction causing severe rash involving mucus membranes or skin necrosis: Unknown Has patient had a PCN reaction that required hospitalization: Unknown Has patient had a PCN reaction occurring within the last 10 years: Unknown If all of the above answers are "NO", then may proceed with Cephalosporin use.   Shellfish Allergy Anaphylaxis, Hives, Rash   Prednisone Other (See Comments)   Bleeding nose bleeding and internal bleeding   Cocoa Butter Rash   Latex Rash      Medication List    TAKE these medications   acetaminophen 500 MG tablet Commonly known as:  TYLENOL Take 1 tablet (500 mg total) by mouth every 6 (six) hours as needed for mild pain, moderate pain, fever or headache.   albuterol 108 (90 Base) MCG/ACT inhaler Commonly known as:  PROVENTIL HFA;VENTOLIN HFA Inhale 2 puffs into the lungs every 6 (six) hours as needed for wheezing or shortness of breath.   EPINEPHrine 0.3 mg/0.3 mL Soaj injection Commonly known as:  EPI-PEN Inject 0.3 mLs (0.3 mg total) into the muscle as needed.   folic acid 1 MG tablet Commonly known as:  FOLVITE Take 1 mg by mouth daily.   ibuprofen 200 MG tablet Commonly known as:  ADVIL,MOTRIN Take 800 mg by mouth daily as needed for headache.   ibuprofen 600 MG tablet Commonly known as:  ADVIL,MOTRIN Take 1  tablet (600 mg total) by mouth every 6 (six) hours as needed.   levETIRAcetam 1000 MG tablet Commonly known as:  KEPPRA Take 1 tablet (1,000 mg total) by mouth 2 (two) times daily for 30 days.   prenatal multivitamin Tabs tablet Take 1 tablet by mouth daily at 12 noon.      Follow-up Information     [facility]. Schedule an appointment as soon as possible for a visit in 1 week(s).   Specialty:  Family Medicine Contact information: [address] [place] [phone]          Allergies  Allergen Reactions  . Bee Venom Anaphylaxis  . Cashew Nut Oil Anaphylaxis  . Penicillins Anaphylaxis    Has patient had a PCN reaction causing immediate rash, facial/tongue/throat swelling, SOB or lightheadedness with hypotension: Unknown Has patient had a PCN reaction causing severe rash involving mucus membranes or skin necrosis: Unknown Has patient had a PCN reaction that required hospitalization: Unknown Has patient had a PCN reaction occurring within the last 10 years: Unknown If all of the above answers are "NO", then may proceed with Cephalosporin use.   . Shellfish Allergy Anaphylaxis, Hives and Rash  . Prednisone Other (See Comments)    Bleeding nose bleeding and internal bleeding  . Cocoa Butter Rash  . Latex Rash    Consultations:  neurology   Procedures/Studies:  No results found.    Subjective: Patient seen at the bedside this morning. Was on LTM EEG.  No active issues otherwise.  No complaints.  Discharge Exam: Vitals:   01/10/19 0717 01/10/19 0800  BP: 118/75   Pulse: (!) 55 (!) 56  Resp: 20 20  Temp:    SpO2: 99% 99%   Vitals:   01/09/19 1613 01/09/19 1950 01/10/19 0717 01/10/19 0800  BP: 117/78 125/81 118/75   Pulse: 68 77 (!) 55 (!) 56  Resp: 16 (!) 29 20 20   Temp: 98.8 F (37.1 C) 98.8 F (37.1 C)    TempSrc: Oral Oral    SpO2:  98% 99% 99%  Weight:      Height:        General: Pt is alert, awake, not in acute distress Cardiovascular: RRR, S1/S2 +, no rubs, no gallops Respiratory: CTA bilaterally, no wheezing, no rhonchi Abdominal: Soft, NT, ND, bowel sounds + Extremities: no edema, no cyanosis    The results of significant diagnostics from this hospitalization (including  imaging, microbiology, ancillary and laboratory) are listed below for reference.     Microbiology: Recent Results (from the past 240 hour(s))  MRSA PCR Screening     Status: None   Collection Time: 01/09/19  1:50 AM  Result Value Ref Range Status   MRSA by PCR NEGATIVE NEGATIVE Final    Comment:        The GeneXpert MRSA Assay (FDA approved for NASAL specimens only), is one component of a comprehensive MRSA colonization surveillance program. It is not intended to diagnose MRSA infection nor to guide or monitor treatment for MRSA infections. Performed at [facility], [address] [place], [place] [address]      Labs: BNP (last 3 results) No results for input(s): BNP in the last 8760 hours. Basic Metabolic Panel: Recent Labs  Lab 01/08/19 2011  NA 141  K 3.5  CL 109  CO2 25  GLUCOSE 98  BUN 8  CREATININE 0.69  CALCIUM 8.7*   Liver Function Tests: No results  for input(s): AST, ALT, ALKPHOS, BILITOT, PROT, ALBUMIN in the last 168 hours. No results for input(s): LIPASE, AMYLASE in the last 168 hours. No results for input(s): AMMONIA in the last 168 hours. CBC: Recent Labs  Lab 01/08/19 2011  WBC 6.5  NEUTROABS 3.5  HGB 12.2  HCT 39.5  MCV 90.0  PLT 197   Cardiac Enzymes: No results for input(s): CKTOTAL, CKMB, CKMBINDEX, TROPONINI in the last 168 hours. BNP: Invalid input(s): POCBNP CBG: Recent Labs  Lab 01/08/19 2002  GLUCAP 92   D-Dimer No results for input(s): DDIMER in the last 72 hours. Hgb A1c No results for input(s): HGBA1C in the last 72 hours. Lipid Profile No results for input(s): CHOL, HDL, LDLCALC, TRIG, CHOLHDL, LDLDIRECT in the last 72 hours. Thyroid function studies No results for input(s): TSH, T4TOTAL, T3FREE, THYROIDAB in the last 72 hours.  Invalid input(s): FREET3 Anemia work up No results for input(s): VITAMINB12, FOLATE, FERRITIN, TIBC, IRON, RETICCTPCT in the last 72 hours. Urinalysis    Component Value  Date/Time   COLORURINE YELLOW 11/29/2018 2315   APPEARANCEUR CLEAR 11/29/2018 2315   APPEARANCEUR Cloudy (A) 04/05/2018 1533   LABSPEC 1.015 11/29/2018 2315   PHURINE 6.0 11/29/2018 2315   GLUCOSEU NEGATIVE 11/29/2018 2315   HGBUR LARGE (A) 11/29/2018 2315   BILIRUBINUR NEGATIVE 11/29/2018 2315   BILIRUBINUR Negative 04/05/2018 1533   KETONESUR 5 (A) 11/29/2018 2315   PROTEINUR NEGATIVE 11/29/2018 2315   UROBILINOGEN 0.2 03/30/2015 2201   NITRITE NEGATIVE 11/29/2018 2315   LEUKOCYTESUR TRACE (A) 11/29/2018 2315   LEUKOCYTESUR 2+ (A) 04/05/2018 1533   Sepsis Labs Invalid input(s): PROCALCITONIN,  WBC,  LACTICIDVEN Microbiology Recent Results (from the past 240 hour(s))  MRSA PCR Screening     Status: None   Collection Time: 01/09/19  1:50 AM  Result Value Ref Range Status   MRSA by PCR NEGATIVE NEGATIVE Final    Comment:        The GeneXpert MRSA Assay (FDA approved for NASAL specimens only), is one component of a comprehensive MRSA colonization surveillance program. It is not intended to diagnose MRSA infection nor to guide or monitor treatment for MRSA infections. Performed at [facility], [address] [place], [place] [address]     Please note: You were cared for by a hospitalist during your hospital stay. Once you are discharged, your primary care physician will handle any further medical issues. Please note that NO REFILLS for any discharge medications will be authorized once you are discharged, as it is imperative that you return to your primary care physician (or establish a relationship with a primary care physician if you do not have one) for your post hospital discharge needs so that they can reassess your need for medications and monitor your lab values.    Time coordinating discharge: 40 minutes  SIGNED:   Burnadette Pop, MD  [facility] 01/10/2019, 11:51 AM Pager [phone]  If 7PM-7AM, please contact  night-coverage [email or web address] Password [email or web address]

## 2019-01-10 NOTE — Progress Notes (Signed)
PROGRESS NOTE    Erin Krueger  EEF:[id number] DOB: [birth year] DOA: 01/08/2019 PCP: [facility]   Brief Narrative: Patient is a 23 year old female with history of seizure, ADHD, bipolar disorder who is presently incarcerated for assault, brought to the emergency department with report of multiple episodes of generalized tonic-clonic seizures.  In the emergency department she had 2 episodes lasting less than a minute.  Also had incontinence of urine.  Patient has history of seizure and she is taking seizure medication Keppra at home.  Neurology consulted.On LTM EEG now.  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  Assessment & Plan:   Principal Problem:   Status epilepticus (HCC) Active Problems:   Seizure (HCC)   Asthma   History of seizures/seizures versus pseudoseizures: Reported to have multiple episodes of generalized tonic-clonic seizures.  Also had 2 seizure episodes in the emergency department.  Was reported that she was briefly unresponsive yesterday.  Neurology suspecting pseudoseizures.  EEG did not show seizure activity.  Started on Keppra.  Currently on LTM EEG Currently she is alert and oriented.  Hemodynamically stable. Continue seizure precaution.  History of asthma: Currently stable  History of ADHD: Not on medications  History of bipolar disorder: Not on medications.  Currently mood stable.         DVT prophylaxis: SCD Code Status: Full Family Communication: None present at the bedside Disposition Plan: Back to incarceration after clearance from neurology   Consultants: Neurology  Procedures: EEG  Antimicrobials: None Anti-infectives (From admission, onward)   None      Subjective: Patient seen and examined the bedside this morning.  Currently on LTM EEG.  No active issues otherwise.  No complaints.  Objective: Vitals:   01/09/19 1613 01/09/19 1950 01/10/19 0717 01/10/19 0800  BP:  117/78 125/81 118/75   Pulse: 68 77 (!) 55 (!) 56  Resp: 16 (!) 29 20 20   Temp: 98.8 F (37.1 C) 98.8 F (37.1 C)    TempSrc: Oral Oral    SpO2:  98% 99% 99%  Weight:      Height:        Intake/Output Summary (Last 24 hours) at 01/10/2019 1128 Last data filed at 01/10/2019 1100 Gross per 24 hour  Intake 593 ml  Output 800 ml  Net -207 ml   Filed Weights   01/09/19 0135  Weight: 75.6 kg    Examination:  General exam: Appears calm and comfortable ,Not in distress,average built HEENT:PERRL,Oral mucosa moist, Ear/Nose normal on gross exam,EEG leads on scalp Respiratory system: Bilateral equal air entry, normal vesicular breath sounds, no wheezes or crackles  Cardiovascular system: S1 & S2 heard, RRR. No JVD, murmurs, rubs, gallops or clicks. Gastrointestinal system: Abdomen is nondistended, soft and nontender. No organomegaly or masses felt. Normal bowel sounds heard. Central nervous system: Alert and oriented. No focal neurological deficits. Extremities: No edema, no clubbing ,no cyanosis, distal peripheral pulses palpable. Skin: No rashes, lesions or ulcers,no icterus ,no pallor MSK: Normal muscle bulk,tone ,power Psychiatry: Judgement and insight appear normal. Mood & affect appropriate.     Data Reviewed: I have personally reviewed following labs and imaging studies  CBC: Recent Labs  Lab 01/08/19 2011  WBC 6.5  NEUTROABS 3.5  HGB 12.2  HCT 39.5  MCV 90.0  PLT 197   Basic Metabolic Panel: Recent Labs  Lab 01/08/19 2011  NA 141  K 3.5  CL 109  CO2 25  GLUCOSE 98  BUN 8  CREATININE 0.69  CALCIUM 8.7*   GFR: Estimated Creatinine Clearance: 118 mL/min (by C-G formula based on SCr of 0.69 mg/dL). Liver Function Tests: No results for input(s): AST, ALT, ALKPHOS, BILITOT, PROT, ALBUMIN in the last 168 hours. No results for input(s): LIPASE, AMYLASE in the last 168 hours. No results for input(s): AMMONIA in the last 168 hours. Coagulation Profile: No  results for input(s): INR, PROTIME in the last 168 hours. Cardiac Enzymes: No results for input(s): CKTOTAL, CKMB, CKMBINDEX, TROPONINI in the last 168 hours. BNP (last 3 results) No results for input(s): PROBNP in the last 8760 hours. HbA1C: No results for input(s): HGBA1C in the last 72 hours. CBG: Recent Labs  Lab 01/08/19 2002  GLUCAP 92   Lipid Profile: No results for input(s): CHOL, HDL, LDLCALC, TRIG, CHOLHDL, LDLDIRECT in the last 72 hours. Thyroid Function Tests: No results for input(s): TSH, T4TOTAL, FREET4, T3FREE, THYROIDAB in the last 72 hours. Anemia Panel: No results for input(s): VITAMINB12, FOLATE, FERRITIN, TIBC, IRON, RETICCTPCT in the last 72 hours. Sepsis Labs: No results for input(s): PROCALCITON, LATICACIDVEN in the last 168 hours.  Recent Results (from the past 240 hour(s))  MRSA PCR Screening     Status: None   Collection Time: 01/09/19  1:50 AM  Result Value Ref Range Status   MRSA by PCR NEGATIVE NEGATIVE Final    Comment:        The GeneXpert MRSA Assay (FDA approved for NASAL specimens only), is one component of a comprehensive MRSA colonization surveillance program. It is not intended to diagnose MRSA infection nor to guide or monitor treatment for MRSA infections. Performed at [facility], [address] [place], [place] [address]          Radiology Studies: No results found.      Scheduled Meds: . folic acid  1 mg Oral Daily   Continuous Infusions: . levETIRAcetam 1,250 mg (01/10/19 0606)     LOS: 0 days    Time spent: 35 mins.More than 50% of that time was spent in counseling and/or coordination of care.      Burnadette Pop, MD [facility] Pager [phone]  If 7PM-7AM, please contact night-coverage [email or web address] Password [facility] 01/10/2019, 11:28 AM

## 2019-01-10 NOTE — Progress Notes (Signed)
Orders to discharge but to keep continuous EEG monitoring going up to discharge. Officer in room notified of pending discharge back to detention/jail center. Will be transported via officer's discretion/policy. IV removed prior to discharge and AVS summary will be be reviewed while EEG monitoring in progress. Hard copy prescription to officer with AVS packet. No evidence of seizure activity, patient alert, aware of surroundings, and oriented. Gabriel Cirri RN

## 2019-01-10 NOTE — Discharge Instructions (Signed)
Epilepsy  Epilepsy is when a person keeps having seizures. A seizure is unusual activity in the brain. A seizure can change how you think or behave, and it can make it hard to be aware of what is happening.  This condition can cause problems, such as:  · Falls, accidents, and injury.  · Depression.  · Poor memory.  · Sudden unexplained death in epilepsy (SUDEP). This is rare. Its cause is not known.  Most people with epilepsy lead normal lives.  Follow these instructions at home:  Medicines    · Take medicines only as told by your doctor.  · Avoid anything that may keep your medicine from working, such as alcohol.  Activity  · Get enough rest. Lack of sleep can make seizures more likely to occur.  · Follow your doctor’s advice about driving, swimming, and doing anything else that would be dangerous if you had a seizure.  Teaching others  Teach friends and family what to do if you have a seizure. They should:  · Lay you on the ground to prevent a fall.  · Cushion your head and body.  · Loosen any tight clothing around your neck.  · Turn you on your side.  · Stay with you until you are better.  · Not hold you down.  · Not put anything in your mouth.  · Know whether or not you need emergency care.  General instructions  · Avoid anything that causes you to have seizures.  · Keep a seizure diary. Write down what you remember about each seizure, and especially what might have caused it.  · Keep all follow-up visits as told by your doctor. This is important.  Contact a doctor if:  · You have a change in your seizure pattern.  · You get an infection or start to feel sick. You may have more seizures when you are sick.  Get help right away if:  · A seizure does not stop after 5 minutes.  · You have more than one seizure in a row, and you do not have enough time between the seizures to feel better.  · A seizure makes it harder to breathe.  · A seizure is different from other seizures you have had.  · A seizure makes you unable  to speak or use a part of your body.  · You did not wake up right after a seizure.  This information is not intended to replace advice given to you by your health care provider. Make sure you discuss any questions you have with your health care provider.  Document Released: 09/06/2009 Document Revised: 06/15/2016 Document Reviewed: 05/19/2016  Elsevier Interactive Patient Education © 2019 [employer] Inc[employer]

## 2019-01-11 ENCOUNTER — Encounter (HOSPITAL_COMMUNITY): Payer: Self-pay | Admitting: Family Medicine

## 2019-01-11 ENCOUNTER — Emergency Department (HOSPITAL_COMMUNITY)
Admission: EM | Admit: 2019-01-11 | Discharge: 2019-01-11 | Disposition: A | Discharge status: Other Acute Inpatient Hospital | Attending: Emergency Medicine | Admitting: Emergency Medicine

## 2019-01-11 DIAGNOSIS — F902 Attention-deficit hyperactivity disorder, combined type: Secondary | ICD-10-CM | POA: Insufficient documentation

## 2019-01-11 DIAGNOSIS — F17290 Nicotine dependence, other tobacco product, uncomplicated: Secondary | ICD-10-CM | POA: Insufficient documentation

## 2019-01-11 DIAGNOSIS — Z79899 Other long term (current) drug therapy: Secondary | ICD-10-CM | POA: Insufficient documentation

## 2019-01-11 DIAGNOSIS — F17210 Nicotine dependence, cigarettes, uncomplicated: Secondary | ICD-10-CM | POA: Insufficient documentation

## 2019-01-11 DIAGNOSIS — R569 Unspecified convulsions: Secondary | ICD-10-CM

## 2019-01-11 DIAGNOSIS — J45909 Unspecified asthma, uncomplicated: Secondary | ICD-10-CM | POA: Diagnosis not present

## 2019-01-11 DIAGNOSIS — Z91040 Latex allergy status: Secondary | ICD-10-CM | POA: Insufficient documentation

## 2019-01-11 DIAGNOSIS — F913 Oppositional defiant disorder: Secondary | ICD-10-CM | POA: Diagnosis not present

## 2019-01-11 LAB — COMPREHENSIVE METABOLIC PANEL
ALT: 20 U/L (ref 0–44)
AST: 17 U/L (ref 15–41)
Albumin: 4.2 g/dL (ref 3.5–5.0)
Alkaline Phosphatase: 65 U/L (ref 38–126)
Anion gap: 9 (ref 5–15)
BUN: 12 mg/dL (ref 6–20)
CO2: 24 mmol/L (ref 22–32)
Calcium: 9.2 mg/dL (ref 8.9–10.3)
Chloride: 107 mmol/L (ref 98–111)
Creatinine, Ser: 0.69 mg/dL (ref 0.44–1.00)
GFR calc Af Amer: >60 mL/min (ref >60)
GFR calc non Af Amer: >60 mL/min (ref >60)
Glucose, Bld: 87 mg/dL (ref 70–99)
Potassium: 3.6 mmol/L (ref 3.5–5.1)
Sodium: 140 mmol/L (ref 135–145)
Total Bilirubin: 0.6 mg/dL (ref 0.3–1.2)
Total Protein: 7.1 g/dL (ref 6.5–8.1)

## 2019-01-11 LAB — CBC WITH DIFFERENTIAL/PLATELET
Abs Immature Granulocytes: 0.04 10*3/uL (ref 0.00–0.07)
Basophils Absolute: 0 10*3/uL (ref 0.0–0.1)
Basophils Relative: 0 %
Eosinophils Absolute: 0.2 10*3/uL (ref 0.0–0.5)
Eosinophils Relative: 2 %
HCT: 42.1 % (ref 36.0–46.0)
Hemoglobin: 13.1 g/dL (ref 12.0–15.0)
Immature Granulocytes: 1 %
Lymphocytes Relative: 29 %
Lymphs Abs: 2.3 10*3/uL (ref 0.7–4.0)
MCH: 28.4 pg (ref 26.0–34.0)
MCHC: 31.1 g/dL (ref 30.0–36.0)
MCV: 91.3 fL (ref 80.0–100.0)
Monocytes Absolute: 0.7 10*3/uL (ref 0.1–1.0)
Monocytes Relative: 9 %
Neutro Abs: 4.8 10*3/uL (ref 1.7–7.7)
Neutrophils Relative %: 59 %
Platelets: 192 10*3/uL (ref 150–400)
RBC: 4.61 MIL/uL (ref 3.87–5.11)
RDW: 13.2 % (ref 11.5–15.5)
WBC: 8.1 10*3/uL (ref 4.0–10.5)
nRBC: 0 % (ref 0.0–0.2)

## 2019-01-11 LAB — MAGNESIUM: Magnesium: 1.8 mg/dL (ref 1.7–2.4)

## 2019-01-11 LAB — PHENYTOIN LEVEL, TOTAL: Phenytoin Lvl: <2.5 ug/mL — ABNORMAL LOW (ref 10.0–20.0)

## 2019-01-11 LAB — PHOSPHORUS: Phosphorus: 3.2 mg/dL (ref 2.5–4.6)

## 2019-01-11 LAB — SALICYLATE LEVEL: Salicylate Lvl: <7 mg/dL (ref 2.8–30.0)

## 2019-01-11 LAB — CBG MONITORING, ED: Glucose-Capillary: 113 mg/dL — ABNORMAL HIGH (ref 70–99)

## 2019-01-11 LAB — I-STAT BETA HCG BLOOD, ED (MC, WL, AP ONLY): I-stat hCG, quantitative: <5 m[IU]/mL (ref <5)

## 2019-01-11 LAB — VALPROIC ACID LEVEL: Valproic Acid Lvl: <10 ug/mL — ABNORMAL LOW (ref 50.0–100.0)

## 2019-01-11 LAB — ETHANOL: Alcohol, Ethyl (B): <10 mg/dL (ref <10)

## 2019-01-11 MED ORDER — SODIUM CHLORIDE 0.9 % IV BOLUS
1000.0000 mL | Freq: Once | INTRAVENOUS | Status: AC
  Administered 2019-01-11: 1000 mL via INTRAVENOUS

## 2019-01-11 MED ORDER — LEVETIRACETAM IN NACL 500 MG/100ML IV SOLN
500.0000 mg | Freq: Once | INTRAVENOUS | Status: AC
  Administered 2019-01-11: 500 mg via INTRAVENOUS
  Filled 2019-01-11: qty 100

## 2019-01-11 MED ORDER — SODIUM CHLORIDE 0.9 % IV SOLN
INTRAVENOUS | Status: DC
  Administered 2019-01-11: 21:00:00 via INTRAVENOUS

## 2019-01-11 MED ORDER — LORAZEPAM 2 MG/ML IJ SOLN
1.0000 mg | Freq: Once | INTRAMUSCULAR | Status: AC
  Administered 2019-01-11: 1 mg via INTRAVENOUS
  Filled 2019-01-11: qty 1

## 2019-01-11 NOTE — ED Triage Notes (Signed)
Patient is from jail and transported by [facility]. According to EMS, patient had 7 witnessed seizures by jail staff and nurse. Seizures lasting 30 sec to 2 mins, roughly 45 secs apart. Patient had urine all over her on scene appeared to blood on her sheets at the jail. During transport, patient had another witnessed seizure. IV established and VERSED 2.5 mg given.

## 2019-01-11 NOTE — ED Provider Notes (Signed)
[facility]EMERGENCY DEPT Provider Note   CSN: [id number] Arrival date & time: 01/11/19  1810    History   Chief Complaint Possible seizures  HPI Ishrat Romos is a 23 y.o. female.  HPI Patient presented to the emergency room for evaluation of possible seizures.  Patient is currently incarcerated.  Patient was recently admitted to the hospital on February 16 for possible status epilepticus.  Patient was evaluated by neurology.  She had EEG that did not show any signs of seizure activity.  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  Patient was cleared for discharge on April 18.  According to the jail staff the patient had possible seizures again today.  They apparently described 8 episodes of shaking today.  Patient is currently somnolent and will not answer my questions but she does look at me Past Medical History:  Diagnosis Date  . ADHD (attention deficit hyperactivity disorder)   . Allergy   . Anxiety   . Asthma   . Eating disorder   . Headache(784.0)   . Kidney stone    kidney stones  . Migraines   . ODD (oppositional defiant disorder)   . PID (acute pelvic inflammatory disease) 01/29/2016  . PTSD (post-traumatic stress disorder)   . Seizures [facility])    diagnosed age 29    Patient Active Problem List   Diagnosis Date Noted  . History of medication noncompliance   . Status epilepticus (HCC) 11/29/2018  . Asthma 11/29/2018  . Seizures (HCC) 08/28/2018  . Third trimester pregnancy   . Persistent UTI (urinary tract infection) during pregnancy 04/08/2018  . Marijuana use 04/07/2018  . Supervision of normal pregnancy Apr 08, 2018  . Smoker Apr 08, 2018  . Death of child 04-08-18  . 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 Breakthrough seizure (HCC) 01/07/2016  . Seizure (HCC) 01/07/2016  . 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 PTSD (post-traumatic stress disorder) 05/12/2013  . ADHD (attention deficit hyperactivity disorder), combined type  05/12/2013  . Polysubstance abuse (HCC) 05/12/2013    Past Surgical History:  Procedure Laterality Date  . right knee surgery    . TONSILLECTOMY AND ADENOIDECTOMY       OB History    Gravida  3   Para  2   Term  2   Preterm      AB  1   Living  1     SAB  1   TAB      Ectopic      Multiple  0   Live Births  2            Home Medications    Prior to Admission medications   Medication Sig Start Date End Date Taking? Authorizing Provider  levETIRAcetam (KEPPRA) 1000 MG tablet Take 1 tablet (1,000 mg total) by mouth 2 (two) times daily for 30 days. 01/10/19 02/09/19 Yes Burnadette Pop, MD  acetaminophen (TYLENOL) 500 MG tablet Take 1 tablet (500 mg total) by mouth every 6 (six) hours as needed for mild pain, moderate pain, fever or headache. Patient not taking: Reported on 01/11/2019 12/01/18   Narda Bonds, MD  albuterol (PROVENTIL HFA;VENTOLIN HFA) 108 (90 Base) MCG/ACT inhaler Inhale 2 puffs into the lungs every 6 (six) hours as needed for wheezing or shortness of breath. Patient not taking: Reported on 01/11/2019 04-08-2018   Cheral Marker, CNM  EPINEPHrine 0.3 mg/0.3 mL IJ SOAJ injection Inject 0.3 mLs (0.3 mg total) into the muscle as needed. Patient not taking: Reported on 01/11/2019 06/27/18  Cheral MarkerBooker, Kimberly R, CNM  Prenatal Vit-Fe Fumarate-FA (PRENATAL MULTIVITAMIN) TABS tablet Take 1 tablet by mouth daily at 12 noon. Patient not taking: Reported on 01/11/2019 04/26/18   Jacklyn Shellresenzo-Dishmon, Frances, CNM    Family History Family History  Problem Relation Age of Onset  . Diabetes Mother   . Diabetes Sister   . Breast cancer Maternal Grandmother   . Diabetes Maternal Grandmother   . Glaucoma Maternal Grandmother     Social History Social History   Tobacco Use  . Smoking status: Current Every Day Smoker    Types: Cigarettes, E-cigarettes  . Smokeless tobacco: Never Used  . Tobacco comment: Not currently since 02/13  Substance Use Topics  . Alcohol  use: Not Currently    Comment: Not since 02/13  . Drug use: Not Currently    Types: Marijuana    Comment: Not since 02/13     Allergies   Bee venom; Cashew nut oil; Penicillins; Shellfish allergy; Prednisone; Cocoa butter; and Latex   Review of Systems Review of Systems  All other systems reviewed and are negative.    Physical Exam Updated Vital Signs BP 129/86   Pulse (!) 57   Resp 19   LMP 01/26/2018   SpO2 100%   Physical Exam Vitals signs and nursing note reviewed.  Constitutional:      General: She is not in acute distress.    Appearance: She is well-developed.  HENT:     Head: Normocephalic and atraumatic.     Right Ear: External ear normal.     Left Ear: External ear normal.  Eyes:     General: No scleral icterus.       Right eye: No discharge.        Left eye: No discharge.     Conjunctiva/sclera: Conjunctivae normal.  Neck:     Musculoskeletal: Neck supple.     Trachea: No tracheal deviation.  Cardiovascular:     Rate and Rhythm: Normal rate and regular rhythm.  Pulmonary:     Effort: Pulmonary effort is normal. No respiratory distress.     Breath sounds: Normal breath sounds. No stridor. No wheezing or rales.  Abdominal:     General: Bowel sounds are normal. There is no distension.     Palpations: Abdomen is soft.     Tenderness: There is no abdominal tenderness. There is no guarding or rebound.  Musculoskeletal:        General: No tenderness.  Skin:    General: Skin is warm and dry.     Findings: No rash.  Neurological:     Mental Status: She is lethargic.     Cranial Nerves: No cranial nerve deficit (no facial droop, ).     Sensory: No sensory deficit.     Motor: No abnormal muscle tone or seizure activity.      ED Treatments / Results  Labs (all labs ordered are listed, but only abnormal results are displayed) Labs Reviewed  PHENYTOIN LEVEL, TOTAL - Abnormal; Notable for the following components:      Result Value   Phenytoin Lvl  <2.5 (*)    All other components within normal limits  VALPROIC ACID LEVEL - Abnormal; Notable for the following components:   Valproic Acid Lvl <10 (*)    All other components within normal limits  CBG MONITORING, ED - Abnormal; Notable for the following components:   Glucose-Capillary 113 (*)    All other components within normal limits  COMPREHENSIVE METABOLIC PANEL  ETHANOL  CBC WITH DIFFERENTIAL/PLATELET  MAGNESIUM  PHOSPHORUS  SALICYLATE LEVEL  RAPID URINE DRUG SCREEN, HOSP PERFORMED  I-STAT BETA HCG BLOOD, ED ([facility], [facility], [facility] ONLY)    EKG EKG Interpretation  Date/Time:  Wednesday January 11 2019 18:26:06 EST Ventricular Rate:  88 PR Interval:    QRS Duration: 101 QT Interval:  354 QTC Calculation: 429 R Axis:   71 Text Interpretation:  Sinus rhythm Borderline Q waves in inferior leads Borderline T wave abnormalities nonspecific t wave changes since last EKG Confirmed by Linwood Dibbles [phone]) on 01/11/2019 9:42:11 PM    Procedures Procedures (including critical care time)  Medications Ordered in ED Medications  sodium chloride 0.9 % bolus 1,000 mL (0 mLs Intravenous Stopped 01/11/19 2150)    And  0.9 %  sodium chloride infusion ( Intravenous New Bag/Given 01/11/19 2046)  LORazepam (ATIVAN) injection 1 mg (1 mg Intravenous Given 01/11/19 1858)  levETIRAcetam (KEPPRA) IVPB 500 mg/100 mL premix (0 mg Intravenous Stopped 01/11/19 2256)     Initial Impression / Assessment and Plan / ED Course  I have reviewed the triage vital signs and the nursing notes.  Pertinent labs & imaging results that were available during my care of the patient were reviewed by me and considered in my medical decision making (see chart for details).   Pt presented to the ED after seizure like activity.  Recently in the hospital for the same issue.  Felt to be most likely non epileptic activity.  Secondary gain component possibly related to her incarceration.  Pt monitored in the ED.  Remains stable.   Labs reassuring.  Appears stable for dc   Final Clinical Impressions(s) / ED Diagnoses   Final diagnoses:  Seizure-like activity [facility])    ED Discharge Orders    None       Linwood Dibbles, MD 01/11/19 2258

## 2019-01-11 NOTE — ED Notes (Addendum)
While attempting to get EMS report, patient had a witnessed a tonic clonic seizure. Heart rate increased to 120's and drew up. Notified Dr. Roselyn Bering, he came to bedside, reporting they were pseudo since she was just admitted for the same complaints with a negative EEG study.

## 2019-01-11 NOTE — ED Notes (Signed)
Bed: WA17 Expected date:  Expected time:  Means of arrival:  Comments: Seizure from jail

## 2019-01-11 NOTE — ED Notes (Signed)
Patient had another seizure like activity, witnessed by staff for about 30 seconds. Also, patient urinated herself. Ativan 1 mg was immediately administered.

## 2019-01-11 NOTE — Discharge Instructions (Addendum)
Continue your current medications, follow up with a neurologist

## 2019-04-09 ENCOUNTER — Other Ambulatory Visit: Payer: Self-pay

## 2019-04-09 ENCOUNTER — Emergency Department (HOSPITAL_COMMUNITY): Payer: Medicaid Other

## 2019-04-09 ENCOUNTER — Encounter (HOSPITAL_COMMUNITY): Payer: Self-pay

## 2019-04-09 ENCOUNTER — Emergency Department (HOSPITAL_COMMUNITY)
Admission: EM | Admit: 2019-04-09 | Discharge: 2019-04-09 | Disposition: A | Discharge status: Home / Self Care | Payer: Medicaid Other | Attending: Emergency Medicine | Admitting: Emergency Medicine

## 2019-04-09 DIAGNOSIS — F17210 Nicotine dependence, cigarettes, uncomplicated: Secondary | ICD-10-CM | POA: Insufficient documentation

## 2019-04-09 DIAGNOSIS — M533 Sacrococcygeal disorders, not elsewhere classified: Secondary | ICD-10-CM | POA: Diagnosis present

## 2019-04-09 DIAGNOSIS — J45909 Unspecified asthma, uncomplicated: Secondary | ICD-10-CM | POA: Diagnosis not present

## 2019-04-09 LAB — POC URINE PREG, ED: Preg Test, Ur: NEGATIVE

## 2019-04-09 IMAGING — DX SACRUM AND COCCYX - 2+ VIEW
3 series · 3 of 3 positions shown · non-contrast
Comparison: None.

CLINICAL DATA: Pain after repetitive trauma well bloating. Tailbone
pain.

EXAM:
SACRUM AND COCCYX - 2+ VIEW

[coccyx ap]
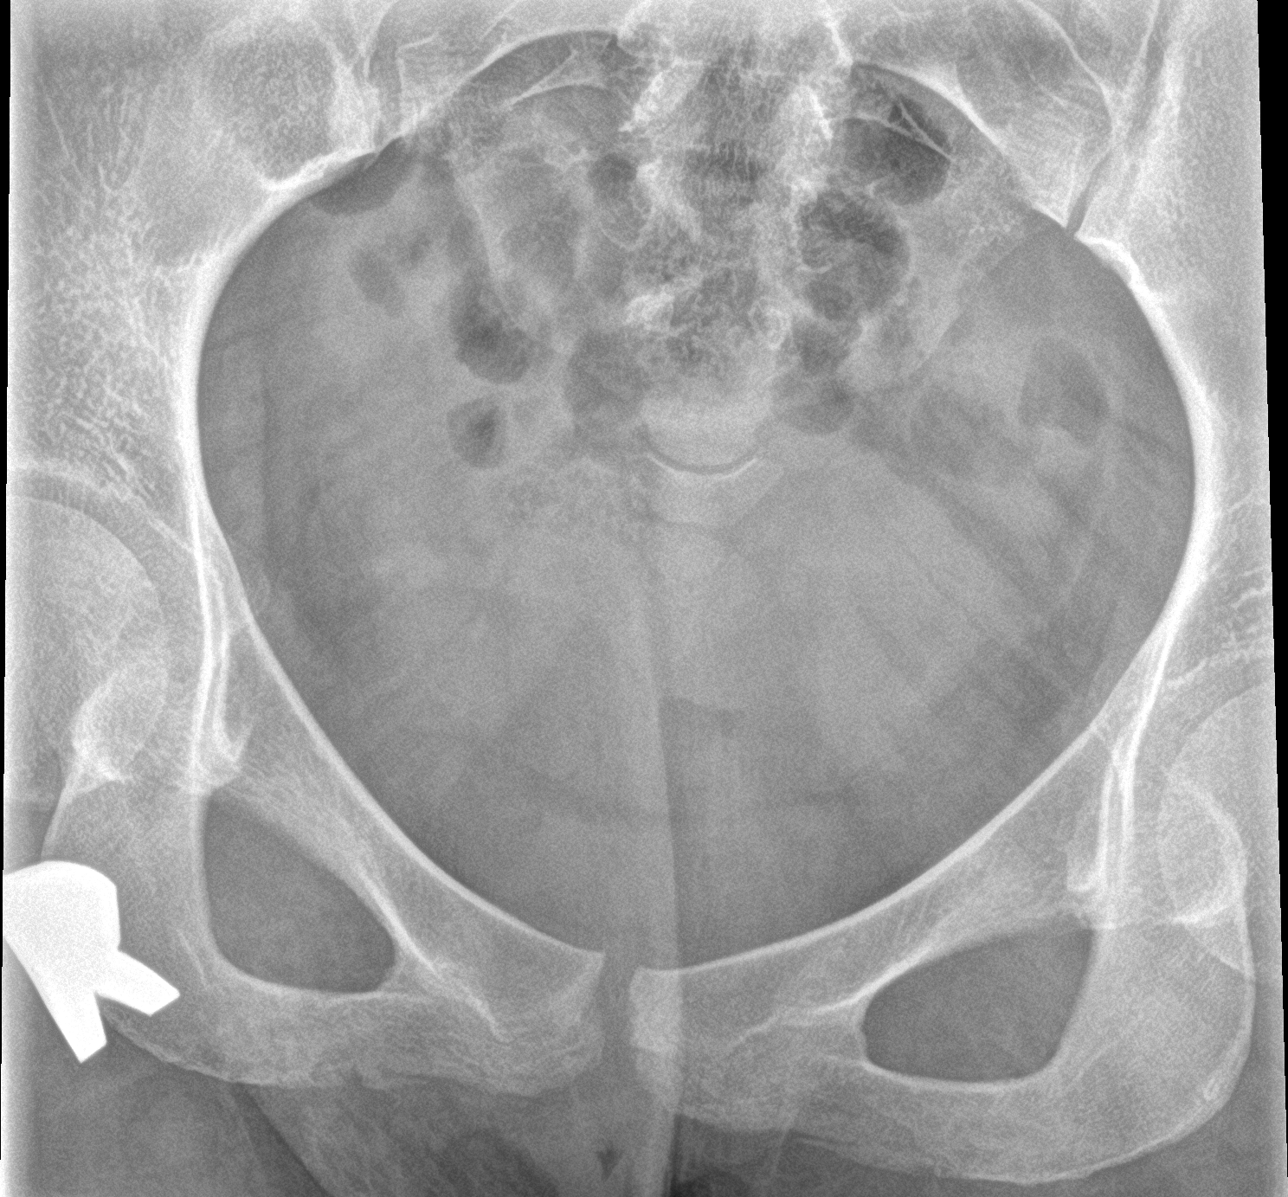

[sacrum ap]
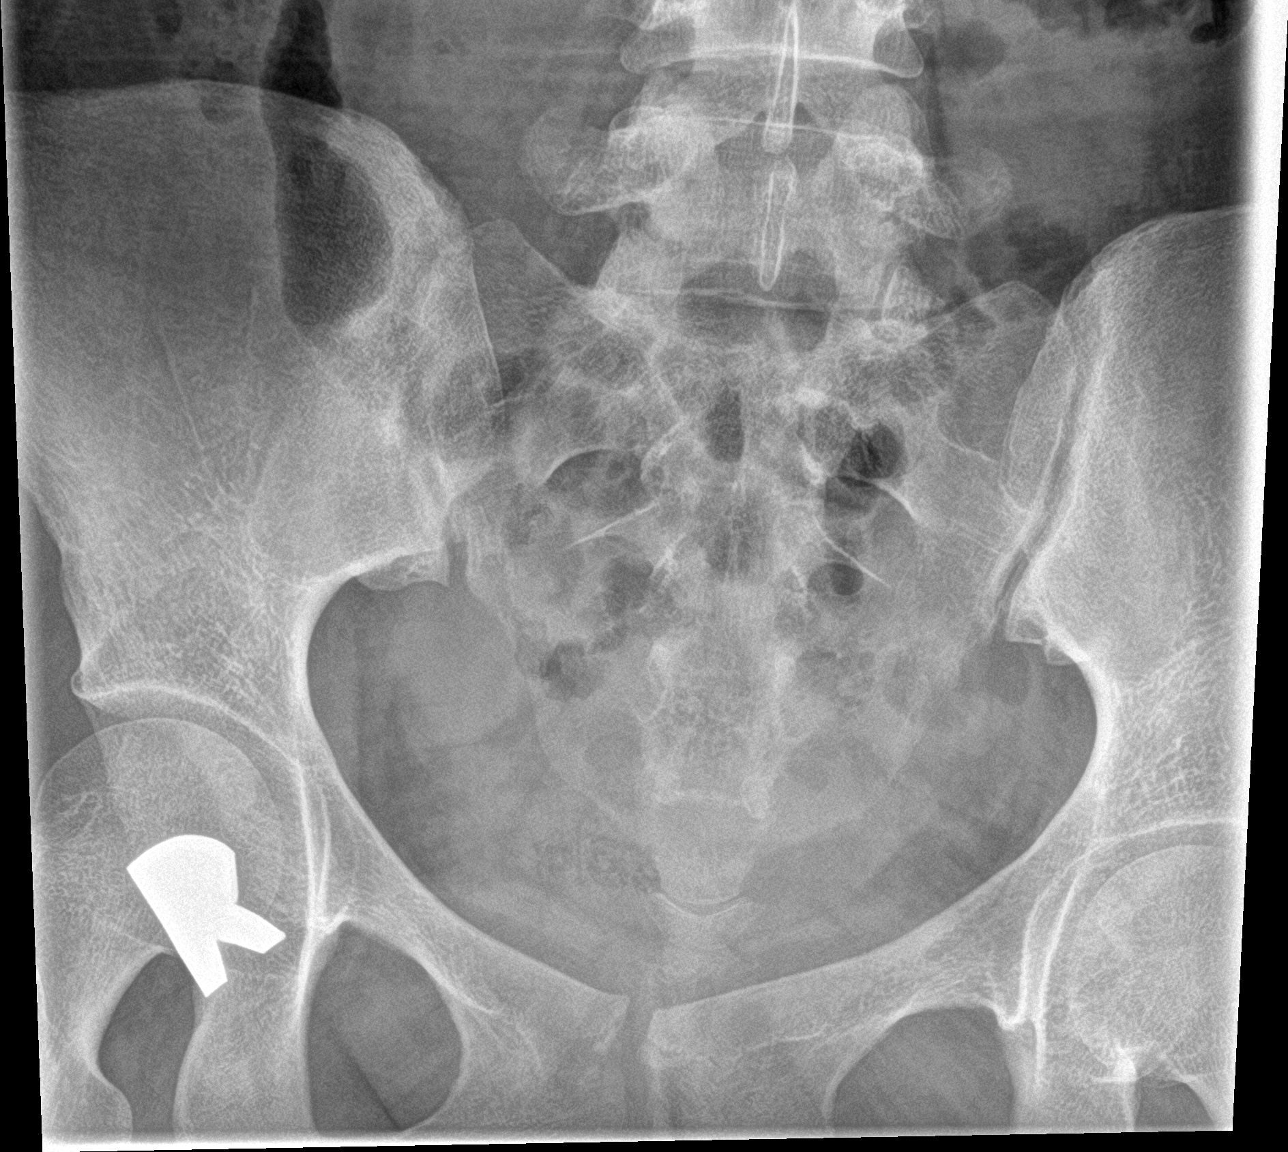

[sacrum lat]
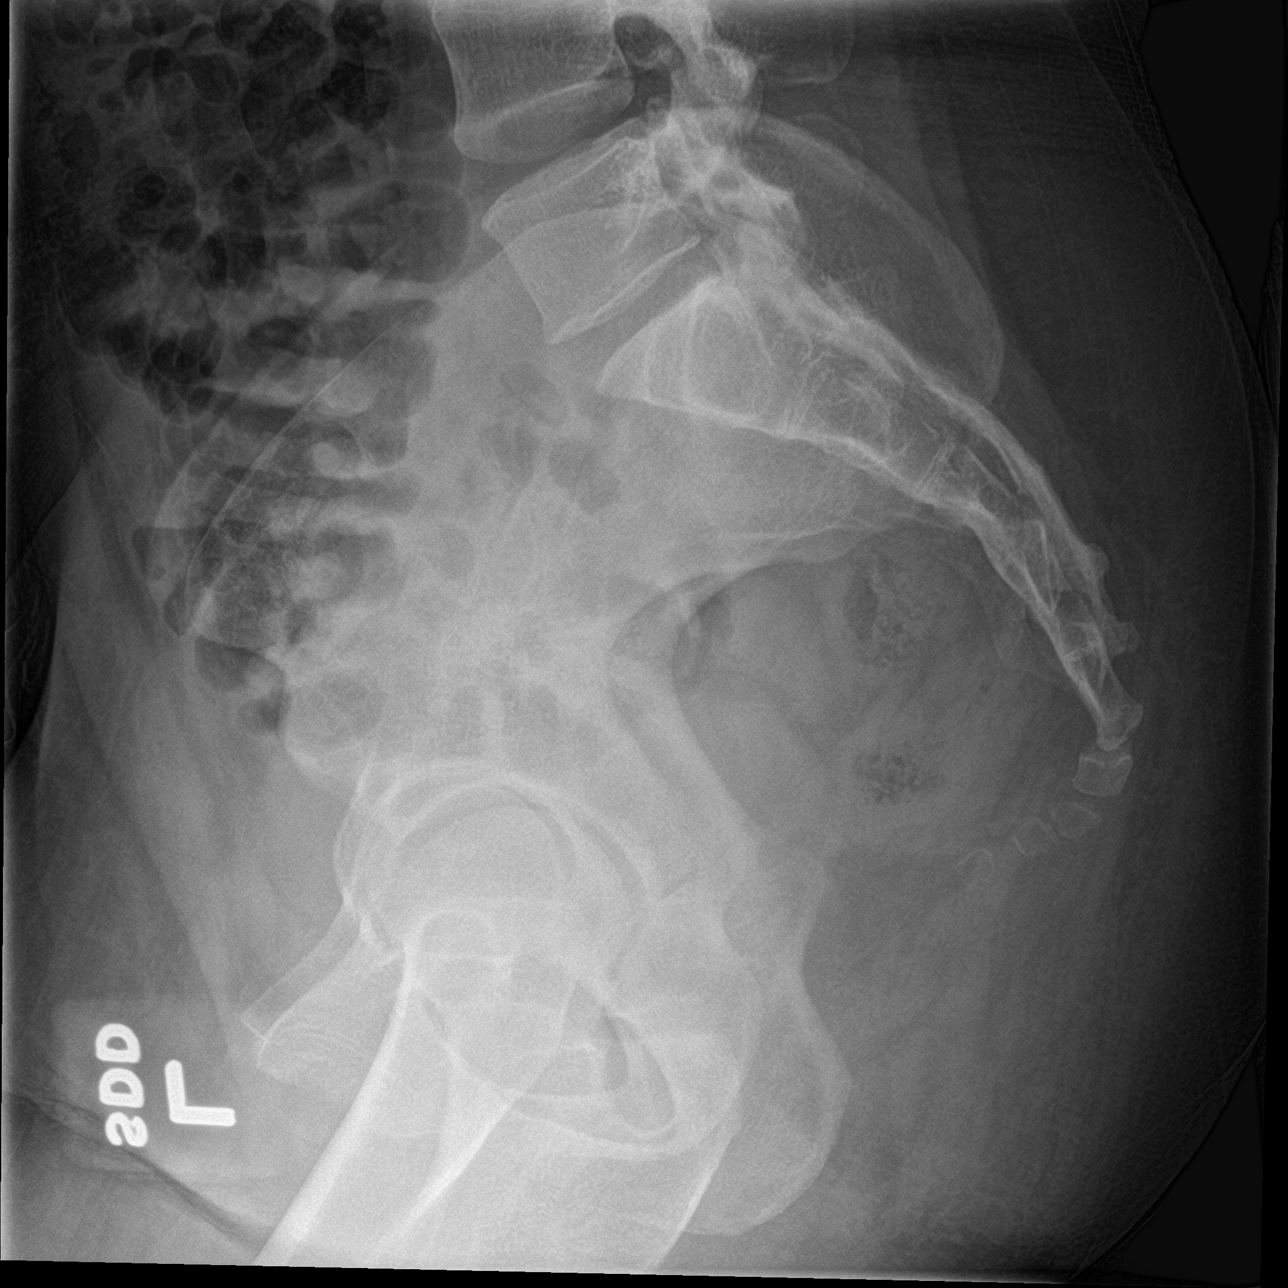

[3 of 3 positions shown; findings below may reference images not displayed]

FINDINGS: There is no evidence of fracture or other focal bone lesions.
IMPRESSION: Negative.

## 2019-04-09 MED ORDER — ETODOLAC 300 MG PO CAPS: 300.0000 mg | ORAL_CAPSULE | Freq: Three times a day (TID) | ORAL | 0 refills | Status: DC
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
## 2019-04-09 NOTE — Discharge Instructions (Addendum)
Continue to ice the area to help with the pain and discomfort, take the medications as needed for pain, the symptoms should slowly improve over the next week
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
## 2019-04-26 ENCOUNTER — Other Ambulatory Visit: Payer: Self-pay

## 2019-04-26 ENCOUNTER — Emergency Department (HOSPITAL_COMMUNITY)
Admission: EM | Admit: 2019-04-26 | Discharge: 2019-04-26 | Disposition: A | Discharge status: Home / Self Care | Payer: Medicaid Other | Attending: Emergency Medicine | Admitting: Emergency Medicine

## 2019-04-26 ENCOUNTER — Encounter (HOSPITAL_COMMUNITY): Payer: Self-pay | Admitting: Emergency Medicine

## 2019-04-26 DIAGNOSIS — F17290 Nicotine dependence, other tobacco product, uncomplicated: Secondary | ICD-10-CM | POA: Insufficient documentation

## 2019-04-26 DIAGNOSIS — F909 Attention-deficit hyperactivity disorder, unspecified type: Secondary | ICD-10-CM | POA: Insufficient documentation

## 2019-04-26 DIAGNOSIS — Z91040 Latex allergy status: Secondary | ICD-10-CM | POA: Insufficient documentation

## 2019-04-26 DIAGNOSIS — J45909 Unspecified asthma, uncomplicated: Secondary | ICD-10-CM | POA: Diagnosis not present

## 2019-04-26 DIAGNOSIS — R569 Unspecified convulsions: Secondary | ICD-10-CM | POA: Insufficient documentation

## 2019-04-26 DIAGNOSIS — Z79899 Other long term (current) drug therapy: Secondary | ICD-10-CM | POA: Insufficient documentation

## 2019-04-26 LAB — CBC WITH DIFFERENTIAL/PLATELET
Abs Immature Granulocytes: 0.02 10*3/uL (ref 0.00–0.07)
Basophils Absolute: 0 10*3/uL (ref 0.0–0.1)
Basophils Relative: 0 %
Eosinophils Absolute: 0.3 10*3/uL (ref 0.0–0.5)
Eosinophils Relative: 3 %
HCT: 41.2 % (ref 36.0–46.0)
Hemoglobin: 12.9 g/dL (ref 12.0–15.0)
Immature Granulocytes: 0 %
Lymphocytes Relative: 36 %
Lymphs Abs: 2.8 10*3/uL (ref 0.7–4.0)
MCH: 29.2 pg (ref 26.0–34.0)
MCHC: 31.3 g/dL (ref 30.0–36.0)
MCV: 93.2 fL (ref 80.0–100.0)
Monocytes Absolute: 0.6 10*3/uL (ref 0.1–1.0)
Monocytes Relative: 8 %
Neutro Abs: 4 10*3/uL (ref 1.7–7.7)
Neutrophils Relative %: 53 %
Platelets: 272 10*3/uL (ref 150–400)
RBC: 4.42 MIL/uL (ref 3.87–5.11)
RDW: 12.7 % (ref 11.5–15.5)
WBC: 7.6 10*3/uL (ref 4.0–10.5)
nRBC: 0 % (ref 0.0–0.2)

## 2019-04-26 LAB — PHOSPHORUS: Phosphorus: 3.3 mg/dL (ref 2.5–4.6)

## 2019-04-26 LAB — BASIC METABOLIC PANEL
Anion gap: 10 (ref 5–15)
BUN: 11 mg/dL (ref 6–20)
CO2: 25 mmol/L (ref 22–32)
Calcium: 9.1 mg/dL (ref 8.9–10.3)
Chloride: 108 mmol/L (ref 98–111)
Creatinine, Ser: 0.73 mg/dL (ref 0.44–1.00)
GFR calc Af Amer: >60 mL/min (ref >60)
GFR calc non Af Amer: >60 mL/min (ref >60)
Glucose, Bld: 91 mg/dL (ref 70–99)
Potassium: 4.4 mmol/L (ref 3.5–5.1)
Sodium: 143 mmol/L (ref 135–145)

## 2019-04-26 LAB — MAGNESIUM: Magnesium: 1.9 mg/dL (ref 1.7–2.4)

## 2019-04-26 LAB — SALICYLATE LEVEL: Salicylate Lvl: <7 mg/dL (ref 2.8–30.0)

## 2019-04-26 LAB — RAPID URINE DRUG SCREEN, HOSP PERFORMED
Amphetamines: NOT DETECTED
Barbiturates: NOT DETECTED
Benzodiazepines: NOT DETECTED
Cocaine: NOT DETECTED
Opiates: NOT DETECTED
Tetrahydrocannabinol: NOT DETECTED

## 2019-04-26 LAB — HCG, QUANTITATIVE, PREGNANCY: hCG, Beta Chain, Quant, S: <1 m[IU]/mL (ref <5)

## 2019-04-26 LAB — PREGNANCY, URINE: Preg Test, Ur: NEGATIVE

## 2019-04-26 LAB — ETHANOL: Alcohol, Ethyl (B): <10 mg/dL (ref <10)

## 2019-04-26 LAB — CBG MONITORING, ED: Glucose-Capillary: 86 mg/dL (ref 70–99)

## 2019-04-26 LAB — ACETAMINOPHEN LEVEL: Acetaminophen (Tylenol), Serum: <10 ug/mL — ABNORMAL LOW (ref 10–30)

## 2019-04-26 MED ORDER — LORAZEPAM 2 MG/ML IJ SOLN: 1.0000 mg | INTRAMUSCULAR | Status: DC | PRN

## 2019-04-26 MED ORDER — SODIUM CHLORIDE 0.9 % IV BOLUS
1000.0000 mL | Freq: Once | INTRAVENOUS | Status: AC
  Administered 2019-04-26: 1000 mL via INTRAVENOUS

## 2019-04-26 MED ORDER — LEVETIRACETAM IN NACL 1000 MG/100ML IV SOLN: 1000.0000 mg | Freq: Once | INTRAVENOUS | Status: DC

## 2019-04-26 MED ORDER — LEVETIRACETAM 1000 MG PO TABS: 1000.0000 mg | ORAL_TABLET | Freq: Two times a day (BID) | ORAL | 0 refills | Status: DC

## 2019-04-26 MED ORDER — LEVETIRACETAM 500 MG PO TABS
1000.0000 mg | ORAL_TABLET | Freq: Once | ORAL | Status: AC
  Administered 2019-04-26: 1000 mg via ORAL
  Filled 2019-04-26: qty 2

## 2019-04-26 NOTE — ED Notes (Signed)
Pt ambulated to the restroom.

## 2019-04-26 NOTE — ED Notes (Signed)
Pt requesting to leave due to childcare issues. PA notified.

## 2019-04-26 NOTE — ED Triage Notes (Signed)
Pt states having two witnessed seizure. Pt states being out of her seizure meds x 3 days,.

## 2019-04-26 NOTE — Discharge Instructions (Addendum)
You have been seen today for seizures. Please read and follow all provided instructions.   1. Medications: take keppra as prescribed, usual home medications 2. Treatment: rest, drink plenty of fluids 3. Follow Up: Please follow up with your primary doctor in 2 days for discussion of your diagnoses and further evaluation after today's visit; if you do not have a primary care doctor use the resource guide provided to find one; Please return to the ER for any new or worsening symptoms. Please obtain all of your results from medical records or have your doctors office obtain the results - share them with your doctor - you should be seen at your doctors office. Call today to arrange your follow up.   Take medications as prescribed. Please review all of the medicines and only take them if you do not have an allergy to them. Return to the emergency room for worsening condition or new concerning symptoms. Follow up with your regular doctor. If you don't have a regular doctor use one of the numbers below to establish a primary care doctor.  Please be aware that if you are taking birth control pills, taking other prescriptions, ESPECIALLY ANTIBIOTICS may make the birth control ineffective - if this is the case, either do not engage in sexual activity or use alternative methods of birth control such as condoms until you have finished the medicine and your family doctor says it is OK to restart them. If you are on a blood thinner such as COUMADIN, be aware that any other medicine that you take may cause the coumadin to either work too much, or not enough - you should have your coumadin level rechecked in next 7 days if this is the case.  ?  It is also a possibility that you have an allergic reaction to any of the medicines that you have been prescribed - Everybody reacts differently to medications and while MOST people have no trouble with most medicines, you may have a reaction such as nausea, vomiting, rash,  swelling, shortness of breath. If this is the case, please stop taking the medicine immediately and contact your physician.  ?  You should return to the ER if you develop severe or worsening symptoms.   Emergency Department Resource Guide 1) Find a Doctor and Pay Out of Pocket Although you won't have to find out who is covered by your insurance plan, it is a good idea to ask around and get recommendations. You will then need to call the office and see if the doctor you have chosen will accept you as a new patient and what types of options they offer for patients who are self-pay. Some doctors offer discounts or will set up payment plans for their patients who do not have insurance, but you will need to ask so you aren't surprised when you get to your appointment.  2) Contact Your Local Health Department Not all health departments have doctors that can see patients for sick visits, but many do, so it is worth a call to see if yours does. If you don't know where your local health department is, you can check in your phone book. The CDC also has a tool to help you locate your state's health department, and many state websites also have listings of all of their local health departments.  3) Find a Walk-in Clinic If your illness is not likely to be very severe or complicated, you may want to try a walk in clinic. These are popping  up all over the country in pharmacies, drugstores, and shopping centers. They're usually staffed by nurse practitioners or physician assistants that have been trained to treat common illnesses and complaints. They're usually fairly quick and inexpensive. However, if you have serious medical issues or chronic medical problems, these are probably not your best option.  No Primary Care Doctor: Call Health Connect at  [phone] - they can help you locate a primary care doctor that  accepts your insurance, provides certain services, etc. Physician Referral Service-  [phone]  Chronic Pain Problems: Organization         Address  Phone   Notes  [facility]  [phone] Patients need to be referred by their primary care doctor.   Medication Assistance: Organization         Address  Phone   Notes  [facility] Medication [facility] [address] [place], [place] [address] [phone] --Must be a resident of [facility] -- Must have NO insurance coverage whatsoever (no Medicaid/ Medicare, etc.) -- The pt. MUST have a primary care doctor that directs their care regularly and follows them in the community   [facility]  [phone]   [employer]  [phone]    Agencies that provide inexpensive medical care: Organization         Address  Phone   Notes  [place]  [phone]   [facility]    [phone]   [facility] [place], [place] [address] [phone]   [place] [address] [place] [phone]   [facility]    [phone]   [facility]    [phone]   [place] and [place] [address], [place] Phone:  [phone], Fax:  [phone] Hours of Operation:  9 am - 6 pm, M-F.  Also accepts Medicaid/Medicare and self-pay.  [facility] for [place] [address], [address], [place] Phone: [phone], Fax: [phone]. Hours of Operation:  8:30 am - 5:30 pm, M-F.  Also accepts Medicaid and self-pay.  [facility] [place] [address], [place] Phone: [phone]   [place], [place], [place] [phone], Ext. [phone] Mondays & Thursdays: 7-9 AM.  First 15 patients are seen on a first come, first serve basis.    [place] Providers:  Organization         Address  Phone   Notes  [facility] [address],  [place] [phone] Also accepts self-pay patients.  [facility] [address], [place]  [phone]   [place], [address], [place] [phone]   [facility] [address], [place] [phone]   Lucianne Lei [address], [place]   [phone] Only accepts [place] patients after they have their name applied to their card.   Self-Pay (no insurance) in [facility]:  Organization         Address  Phone   Notes  Sickle Cell Patients, [facility] [place] [phone]   [facility] [place] [phone]   [facility] [place]  [address], [address], [place] [phone]   [facility]/Dr. Osei-Bonsu  [address] [place] or [address], [place] [phone] Phone number for both [place] and [place] locations is the same.  [facility] and [facility] [address], [place] [phone]   [facility] [address] [place] or [address] [phone]   [facility] [address], [place] [phone], phone; [phone], fax Sees patients 1st and 3rd Saturday of every month.  Must not qualify for public or private insurance (i.e. Medicaid, Medicare, [address] Health Choice, Veterans' Benefits)  Household income should be no more than 200% of the poverty level The clinic cannot treat you if you are pregnant or think you are pregnant  Sexually transmitted diseases are not treated at the clinic.

## 2019-04-26 NOTE — ED Provider Notes (Addendum)
[facility] EMERGENCY DEPARTMENT Provider Note   CSN: [id number] Arrival date & time: 04/26/19  1310    History   Chief Complaint Chief Complaint  Patient presents with  . Seizures    HPI Erin Krueger is a 23 y.o. female with a PMH of Seizures, ADHD, Anxiety, Eating disorder, migraines, and PTSD presenting after two witnessed grand mal seizures this morning. Husband reports first seizure lasted 7 minutes and second seizure was 1.5 minutes. Patient states she ran out of her medications 3 days ago and has not been able to get refills. Patient states she has had seizures since she was a child. Patient reports having 1-2 seizures per month. Spoke to husband over the phone and husband states he protected her during the seizure. Patient also states she had a positive pregnancy test recently, but states she believes she had a miscarriage 3 days ago. Patient states she passed a large blood clot and states she has had miscarriages in the past. Patient denies fever, chills, nausea, vomiting, abdominal pain, cough, congestion, sick exposures, or recent travel. Patient denies tongue biting or incontinence. Patient denies weakness, headaches, numbness, vision changes, or dizziness.    HPI  Past Medical History:  Diagnosis Date  . ADHD (attention deficit hyperactivity disorder)   . Allergy   . Anxiety   . Asthma   . Eating disorder   . Headache(784.0)   . Kidney stone    kidney stones  . Migraines   . ODD (oppositional defiant disorder)   . PID (acute pelvic inflammatory disease) 01/29/2016  . PTSD (post-traumatic stress disorder)   . Seizures [facility])    diagnosed age 89    Patient Active Problem List   Diagnosis Date Noted  . History of medication noncompliance   . Status epilepticus (HCC) 11/29/2018  . Asthma 11/29/2018  . Seizures (HCC) 08/28/2018  . Third trimester pregnancy   . Persistent UTI (urinary tract infection) during pregnancy 04/08/2018  . Marijuana use 04/07/2018  .  Supervision of normal pregnancy Apr 15, 2018  . Smoker 04/15/18  . Death of child 2018-04-15  . 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 Breakthrough seizure (HCC) 01/07/2016  . Seizure (HCC) 01/07/2016  . 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 PTSD (post-traumatic stress disorder) 05/12/2013  . 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 Polysubstance abuse (HCC) 05/12/2013    Past Surgical History:  Procedure Laterality Date  . right knee surgery    . TONSILLECTOMY AND ADENOIDECTOMY       OB History    Gravida  3   Para  2   Term  2   Preterm      AB  1   Living  1     SAB  1   TAB      Ectopic      Multiple  0   Live Births  2            Home Medications    Prior to Admission medications   Medication Sig Start Date End Date Taking? Authorizing Provider  albuterol (PROVENTIL HFA;VENTOLIN HFA) 108 (90 Base) MCG/ACT inhaler Inhale 2 puffs into the lungs every 6 (six) hours as needed for wheezing or shortness of breath. 15-Apr-2018  Yes Cheral Marker, CNM  EPINEPHrine 0.3 mg/0.3 mL IJ SOAJ injection Inject 0.3 mLs (0.3 mg total) into the muscle as needed. Patient not taking: Reported on 01/11/2019 06/27/18   Cheral Marker, CNM  levETIRAcetam (KEPPRA) 1000 MG tablet Take 1  tablet (1,000 mg total) by mouth 2 (two) times daily for 30 days. 04/26/19 05/26/19  Leretha DykesHernandez, Alexsis Branscom P, PA-C    Family History Family History  Problem Relation Age of Onset  . Diabetes Mother   . Diabetes Sister   . Breast cancer Maternal Grandmother   . Diabetes Maternal Grandmother   . Glaucoma Maternal Grandmother     Social History Social History   Tobacco Use  . Smoking status: Current Every Day Smoker    Packs/day: 0.50    Types: Cigarettes, E-cigarettes  . Smokeless tobacco: Never Used  . Tobacco comment: Not currently since 02/13  Substance Use Topics  . Alcohol use: Yes    Comment: Not since 02/13  . Drug use: Never    Comment:  Not since 02/13     Allergies   Bee venom; Cashew nut oil; Penicillins; Shellfish allergy; Lodine [etodolac]; Prednisone; Cocoa butter; and Latex   Review of Systems Review of Systems  Constitutional: Negative for chills, diaphoresis and fever.  HENT: Negative for congestion and rhinorrhea.   Eyes: Negative for visual disturbance.  Respiratory: Negative for cough and shortness of breath.   Cardiovascular: Negative for chest pain.  Gastrointestinal: Negative for abdominal pain, nausea and vomiting.  Endocrine: Negative for cold intolerance and heat intolerance.  Genitourinary: Positive for vaginal bleeding. Negative for dysuria, flank pain, frequency, hematuria, pelvic pain and vaginal pain.  Musculoskeletal: Negative for back pain, gait problem, neck pain and neck stiffness.  Skin: Negative for rash.  Allergic/Immunologic: Negative for immunocompromised state.  Neurological: Positive for seizures. Negative for dizziness, tremors, syncope, facial asymmetry, speech difficulty, weakness, light-headedness, numbness and headaches.  Hematological: Negative for adenopathy.  Psychiatric/Behavioral: Negative for confusion.     Physical Exam Updated Vital Signs BP 104/71   Pulse (!) 55   Temp 98.3 F (36.8 C) (Oral)   Resp 16   Ht 5\' 7"  (1.702 m)   Wt 81.6 kg   SpO2 100%   BMI 28.19 kg/m   Physical Exam Vitals signs and nursing note reviewed.  Constitutional:      General: She is not in acute distress.    Appearance: She is well-developed. She is not diaphoretic.  HENT:     Head: Normocephalic and atraumatic.     Right Ear: Tympanic membrane, ear canal and external ear normal.     Left Ear: Tympanic membrane, ear canal and external ear normal.     Nose: Nose normal. No rhinorrhea.     Mouth/Throat:     Mouth: Mucous membranes are moist.     Pharynx: No posterior oropharyngeal erythema.  Eyes:     Extraocular Movements: Extraocular movements intact.     Conjunctiva/sclera:  Conjunctivae normal.     Pupils: Pupils are equal, round, and reactive to light.  Neck:     Musculoskeletal: Normal range of motion.  Cardiovascular:     Rate and Rhythm: Normal rate and regular rhythm.     Heart sounds: Normal heart sounds. No murmur. No friction rub. No gallop.   Pulmonary:     Effort: Pulmonary effort is normal. No respiratory distress.     Breath sounds: Normal breath sounds. No wheezing or rales.  Abdominal:     Palpations: Abdomen is soft.     Tenderness: There is no abdominal tenderness.  Musculoskeletal: Normal range of motion.  Skin:    General: Skin is warm.     Findings: No erythema or rash.  Neurological:     Mental Status: She  is alert.    Mental Status:  Alert, oriented, thought content appropriate, able to give a coherent history. Speech fluent without evidence of aphasia. Able to follow 2 step commands without difficulty.  Cranial Nerves:  II:  Peripheral visual fields grossly normal, pupils equal, round, reactive to light III,IV, VI: ptosis not present, extra-ocular motions intact bilaterally  V,VII: smile symmetric, facial light touch sensation equal VIII: hearing grossly normal to voice  IX,X: symmetric elevation of soft palate, uvula elevates symmetrically  XI: bilateral shoulder shrug symmetric and strong XII: midline tongue extension without fassiculations Motor:  Normal tone. 5/5 in upper and lower extremities bilaterally including strong and equal grip strength and dorsiflexion/plantar flexion Sensory: Pinprick and light touch normal in all extremities.  Deep Tendon Reflexes: 2+ and symmetric in the biceps and patella Cerebellar: normal finger-to-nose with bilateral upper extremities Gait: normal gait and balance without difficulty. Negative pronator drift. Negative Romberg sign. CV: distal pulses palpable throughout    ED Treatments / Results  Labs (all labs ordered are listed, but only abnormal results are displayed) Labs Reviewed   ACETAMINOPHEN LEVEL - Abnormal; Notable for the following components:      Result Value   Acetaminophen (Tylenol), Serum <10 (*)    All other components within normal limits  BASIC METABOLIC PANEL  CBC WITH DIFFERENTIAL/PLATELET  MAGNESIUM  PHOSPHORUS  SALICYLATE LEVEL  ETHANOL  RAPID URINE DRUG SCREEN, HOSP PERFORMED  HCG, QUANTITATIVE, PREGNANCY  PREGNANCY, URINE  CBG MONITORING, ED    EKG None  Radiology No results found.  Procedures Procedures (including critical care time)  Medications Ordered in ED Medications  LORazepam (ATIVAN) injection 1 mg (has no administration in time range)  sodium chloride 0.9 % bolus 1,000 mL (0 mLs Intravenous Stopped 04/26/19 1530)  levETIRAcetam (KEPPRA) tablet 1,000 mg (1,000 mg Oral Given 04/26/19 1608)     Initial Impression / Assessment and Plan / ED Course  I have reviewed the triage vital signs and the nursing notes.  Pertinent labs & imaging results that were available during my care of the patient were reviewed by me and considered in my medical decision making (see chart for details).  Clinical Course as of Apr 25 1610  Wed Apr 26, 2019  1536 Patient is insisting to be discharged. Patient states she needs to go and pick up her kids. Patient states she is not driving and states her husband is waiting outside. Patient is refusing any further treatment at this time. Patient understands this would including leaving AMA. Patient states she understands the risks and is willing to be discharged.    [AH]  1605 Patient was convinced to speak to teleneurology prior to discharge and agreed to have oral Keppra prior to being discharged.    [AH]    Clinical Course User Index [AH] Leretha Dykes, PA-C      Patient presents after a seizure. Patient believed to have had a miscarriage recently. Pregnancy test is negative. Provided IVF and Keppra while in the ER. Patient has not had subsequent seizures while in the ER. Discussed case Dr.  Clarene Duke and she recommends consult with teleneurology. Prior to consult, patient is refusing any additional treatment and requesting to leave. Patient did not receive Keppra or teleneurology consult while in the ER. Suspect symptoms are likely due to not taking antiepileptics and patient is currently at baseline, but patient refused to have any additional treatments and monitoring. Refilled Keppra and advised patient to follow up with PCP.   The  patient and I discussed the nature and purpose, risks and benefits, as well as, the alternatives of treatment. Time was given to allow the opportunity to ask questions and consider options. After the discussion, the patient decided to refuse the offerred treatment. The patient was informed that refusal could lead to, but was not limited to, death, permanent disability, or severe pain. If present, I asked the relatives and/or significant others to dissuade the patient without success. Prior to refusing, I determined that the patient had the capacity to make their decision and understood the consequences of that decision. After refusal, I made every reasonable effort to treat them to the best of my ability.  The patient was notified that they may return to the emergency department at any time for further treatment.    I have discussed my concerns as her provider and the possibility that this may worsen. I have specifically discussed that without further evaluation I cannot guarantee there is not a life threatening event occuring.  Pt is A&Ox4, his own POA and states understanding of my concerns and the possible consequences.  I have made pt aware that this is an AMA discharge, but she may return at any time for further evaluation and treatment.  After a few minutes, patient was agreeable to speak to teleneurology and agreed to have oral keppra provided before discharge. Teleneurology recommends providing oral keppra and monitoring until baseline. Patient is currently at  baseline. Patient is stable for discharge.   Findings and plan of care discussed with supervising physician Dr. Jacqulyn Bath prior to discharge.  Final Clinical Impressions(s) / ED Diagnoses   Final diagnoses:  Seizure [facility])    ED Discharge Orders         Ordered    levETIRAcetam (KEPPRA) 1000 MG tablet  2 times daily     04/26/19 1540           Leretha Dykes, [place] 04/26/19 1546    Leretha Dykes, [place] 04/26/19 1612    Maia Plan, MD 04/26/19 1906

## 2019-04-26 NOTE — Consult Note (Signed)
[facility] TeleNeurology Consult Services  Stat Consult  Date of Service:   04/26/2019 15:03:27  Impression:     .  Seizure  Comments/Sign-Out: Patient is a 23 years old woman who presented to the ED with 2 breakthrough seizures due to non compliance with medications. She is now back to her baseline. Her neurologic exam is unremarkable. Recommend to resume Keppra at 1,000 mg po bid. Seizures precautions were advised including no driving. Outpatient Neurology follow-up in 2-3 weeks is recommended.  Metrics: [facility] Notification Time: 04/26/2019 15:01:11 Stamp Time: 04/26/2019 15:03:27 Video Start Time: 04/26/2019 15:25:00 Video End Time: 04/26/2019 15:57:33  Our recommendations are outlined below.  Recommendations:     .  Start Keppra 1000 mg BID   Disposition: Sign Off  Sign Out:     .  Discussed with Emergency Department Provider  ----------------------------------------------------------------------------------------------------  Chief Complaint: Seizure  History of Present Illness: Patient is a 23 year old Female.  Patient is a 23 years old woman with history of epilepsy who presents to the ED after having 2 seizures back to back today. Patient reports having full recovery between seizures. Both seizures were just as her usual ones. She is now back to her baseline. Patient reports that she ran out of her Keppra 3 days ago and couldn't get a prescription from her physician. She also had a miscarriage 3 days ago.  Examination:  Neuro Exam:  General: Alert,Awake  Speech: Fluent:  Language: Intact:  Face: Right  Facial Sensation: Intact:  Visual Fields: Intact:  Extraocular Movements: Intact:  Motor Exam: No Drift:  Sensation: Intact:  Coordination: Intact:    Patient/Family was informed the Neurology Consult would happen via TeleHealth consult by way of interactive audio and video telecommunications and consented to receiving care in this  manner.  Due to the immediate potential for life-threatening deterioration due to underlying acute neurologic illness, I spent 35 minutes providing critical care. This time includes time for face to face visit via telemedicine, review of medical records, imaging studies and discussion of findings with providers, the patient and/or family.   Dr Vira Agar   [facility] [phone]   Case [id number]

## 2019-04-28 ENCOUNTER — Encounter (HOSPITAL_COMMUNITY): Payer: Self-pay | Admitting: Emergency Medicine

## 2019-04-28 ENCOUNTER — Other Ambulatory Visit: Payer: Self-pay

## 2019-04-28 ENCOUNTER — Emergency Department (HOSPITAL_COMMUNITY)
Admission: EM | Admit: 2019-04-28 | Discharge: 2019-04-28 | Disposition: A | Discharge status: Home / Self Care | Payer: Medicaid Other | Attending: Emergency Medicine | Admitting: Emergency Medicine

## 2019-04-28 DIAGNOSIS — N3000 Acute cystitis without hematuria: Secondary | ICD-10-CM

## 2019-04-28 DIAGNOSIS — J45909 Unspecified asthma, uncomplicated: Secondary | ICD-10-CM | POA: Diagnosis not present

## 2019-04-28 DIAGNOSIS — F17210 Nicotine dependence, cigarettes, uncomplicated: Secondary | ICD-10-CM | POA: Diagnosis not present

## 2019-04-28 DIAGNOSIS — R350 Frequency of micturition: Secondary | ICD-10-CM | POA: Diagnosis present

## 2019-04-28 DIAGNOSIS — Z91040 Latex allergy status: Secondary | ICD-10-CM | POA: Diagnosis not present

## 2019-04-28 LAB — URINALYSIS, ROUTINE W REFLEX MICROSCOPIC
Bilirubin Urine: NEGATIVE
Glucose, UA: NEGATIVE mg/dL
Hgb urine dipstick: NEGATIVE
Ketones, ur: NEGATIVE mg/dL
Nitrite: POSITIVE — AB
Protein, ur: NEGATIVE mg/dL
Specific Gravity, Urine: 1.03 (ref 1.005–1.030)
WBC, UA: >50 WBC/hpf — ABNORMAL HIGH (ref 0–5)
pH: 6 (ref 5.0–8.0)

## 2019-04-28 LAB — POC URINE PREG, ED: Preg Test, Ur: NEGATIVE

## 2019-04-28 MED ORDER — NITROFURANTOIN MONOHYD MACRO 100 MG PO CAPS
100.0000 mg | ORAL_CAPSULE | Freq: Once | ORAL | Status: AC
  Administered 2019-04-28: 100 mg via ORAL
  Filled 2019-04-28: qty 1

## 2019-04-28 MED ORDER — NITROFURANTOIN MONOHYD MACRO 100 MG PO CAPS: 100.0000 mg | ORAL_CAPSULE | Freq: Two times a day (BID) | ORAL | 0 refills | Status: DC

## 2019-04-28 NOTE — ED Notes (Signed)
Pt reports she has had lower abd pain x 1 week  Took OTC meds without relief  Had a virtual appt with physician at [place] who per pt told her he could not dx or give meds and should she get worse come to the ED

## 2019-04-28 NOTE — Discharge Instructions (Addendum)
Take the entire course of the antibiotic prescribed, taking your next dose tomorrow morning.  Make sure you are drinking plenty of fluids.  Get rechecked for any worsening symptoms including development of fever, vomiting or worse pain.

## 2019-04-28 NOTE — ED Triage Notes (Signed)
Pt c/o lower, medial abd pain with urinary burning and frequency. States "it feels like that last time I had a UTI."

## 2019-04-28 NOTE — ED Notes (Signed)
Ambulates to BR erect without guarding and with relaxed facial features

## 2019-04-29 NOTE — ED Provider Notes (Signed)
[facility] EMERGENCY DEPARTMENT Provider Note   CSN: [id number] Arrival date & time: 04/28/19  2153    History   Chief Complaint Chief Complaint  Patient presents with  . Abdominal Pain    HPI Erin Krueger is a 23 y.o. female presenting with a one week history of mild lower back pain described as aching in character, then developed increased urinary frequency, urgency and burning with urination yesterday.  She denies fevers or chills, has had no nausea, vomiting or abdominal pain, but describes suprapubic pressure.  She does have a history of UTI and her symptoms are similar to prior episodes, most recently in December where she was placed on Macrobid with resolution of her symptoms.  She has had no treatments prior to arrival today.     The history is provided by the patient.    Past Medical History:  Diagnosis Date  . ADHD (attention deficit hyperactivity disorder)   . Allergy   . Anxiety   . Asthma   . Eating disorder   . Headache(784.0)   . Kidney stone    kidney stones  . Migraines   . ODD (oppositional defiant disorder)   . PID (acute pelvic inflammatory disease) 01/29/2016  . PTSD (post-traumatic stress disorder)   . Seizures [facility])    diagnosed age 65    Patient Active Problem List   Diagnosis Date Noted  . History of medication noncompliance   . Status epilepticus ([place]) 11/29/2018  . Asthma 11/29/2018  . Seizures ([place]) 08/28/2018  . Third trimester pregnancy   . Persistent UTI (urinary tract infection) during pregnancy 04/08/2018  . Marijuana use 04/07/2018  . Supervision of normal pregnancy 04/15/18  . Smoker 15-Apr-2018  . Death of child 04/15/18  . 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 Breakthrough seizure ([place]) 01/07/2016  . Seizure ([place]) 01/07/2016  . 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 PTSD (post-traumatic stress disorder) 05/12/2013  . 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 Polysubstance abuse  ([place]) 05/12/2013    Past Surgical History:  Procedure Laterality Date  . right knee surgery    . TONSILLECTOMY AND ADENOIDECTOMY       OB History    Gravida  3   Para  2   Term  2   Preterm      AB  1   Living  1     SAB  1   TAB      Ectopic      Multiple  0   Live Births  2            Home Medications    Prior to Admission medications   Medication Sig Start Date End Date Taking? Authorizing Provider  albuterol (PROVENTIL HFA;VENTOLIN HFA) 108 (90 Base) MCG/ACT inhaler Inhale 2 puffs into the lungs every 6 (six) hours as needed for wheezing or shortness of breath. Apr 15, 2018   Roma Schanz, CNM  EPINEPHrine 0.3 mg/0.3 mL IJ SOAJ injection Inject 0.3 mLs (0.3 mg total) into the muscle as needed. Patient not taking: Reported on 01/11/2019 06/27/18   Roma Schanz, CNM  levETIRAcetam (KEPPRA) 1000 MG tablet Take 1 tablet (1,000 mg total) by mouth 2 (two) times daily for 30 days. 04/26/19 05/26/19  Darlin Drop P, PA-C  nitrofurantoin, macrocrystal-monohydrate, (MACROBID) 100 MG capsule Take 1 capsule (100 mg total) by mouth 2 (two) times daily. 04/28/19   Evalee Jefferson, PA-C    Family History Family History  Problem Relation Age of Onset  .  Diabetes Mother   . Diabetes Sister   . Breast cancer Maternal Grandmother   . Diabetes Maternal Grandmother   . Glaucoma Maternal Grandmother     Social History Social History   Tobacco Use  . Smoking status: Current Every Day Smoker    Packs/day: 0.50    Types: Cigarettes, E-cigarettes  . Smokeless tobacco: Never Used  . Tobacco comment: Not currently since 02/13  Substance Use Topics  . Alcohol use: Yes    Comment: Not since 02/13  . Drug use: Never    Comment: Not since 02/13     Allergies   Bee venom; Cashew nut oil; Penicillins; Shellfish allergy; Lodine [etodolac]; Prednisone; Cocoa butter; and Latex   Review of Systems Review of Systems  Constitutional: Negative for chills and fever.  HENT:  Negative for congestion and sore throat.   Eyes: Negative.   Respiratory: Negative for chest tightness and shortness of breath.   Cardiovascular: Negative for chest pain.  Gastrointestinal: Negative for abdominal pain, nausea and vomiting.  Genitourinary: Positive for dysuria, frequency and urgency. Negative for pelvic pain and vaginal discharge.  Musculoskeletal: Positive for back pain. Negative for arthralgias, joint swelling and neck pain.  Skin: Negative.  Negative for rash and wound.  Neurological: Negative for dizziness, weakness, light-headedness, numbness and headaches.  Psychiatric/Behavioral: Negative.      Physical Exam Updated Vital Signs BP 122/81 (BP Location: Right Arm)   Pulse 73   Temp 98.2 F (36.8 C) (Oral)   Resp 16   Ht 5\' 7"  (1.702 m)   Wt 75.3 kg   SpO2 99%   BMI 26.00 kg/m   Physical Exam Vitals signs and nursing note reviewed.  Constitutional:      Appearance: She is well-developed.  HENT:     Head: Normocephalic and atraumatic.  Eyes:     Conjunctiva/sclera: Conjunctivae normal.  Neck:     Musculoskeletal: Normal range of motion.  Cardiovascular:     Rate and Rhythm: Normal rate and regular rhythm.     Heart sounds: Normal heart sounds.  Pulmonary:     Effort: Pulmonary effort is normal.     Breath sounds: Normal breath sounds. No wheezing.  Abdominal:     General: Bowel sounds are normal.     Palpations: Abdomen is soft.     Tenderness: There is no abdominal tenderness. There is no right CVA tenderness or left CVA tenderness.  Musculoskeletal: Normal range of motion.  Skin:    General: Skin is warm and dry.  Neurological:     Mental Status: She is alert.      ED Treatments / Results  Labs (all labs ordered are listed, but only abnormal results are displayed) Labs Reviewed  URINALYSIS, ROUTINE W REFLEX MICROSCOPIC - Abnormal; Notable for the following components:      Result Value   APPearance CLOUDY (*)    Nitrite POSITIVE (*)     Leukocytes,Ua SMALL (*)    WBC, UA >50 (*)    Bacteria, UA FEW (*)    All other components within normal limits  POC URINE PREG, ED    EKG None  Radiology No results found.  Procedures Procedures (including critical care time)  Medications Ordered in ED Medications  nitrofurantoin (macrocrystal-monohydrate) (MACROBID) capsule 100 mg (100 mg Oral Given 04/28/19 2356)     Initial Impression / Assessment and Plan / ED Course  I have reviewed the triage vital signs and the nursing notes.  Pertinent labs & imaging results  that were available during my care of the patient were reviewed by me and considered in my medical decision making (see chart for details).        Patient with UA and history suggesting simple acute cystitis.  She has no CVA tenderness, doubt pyelonephritis, no kidney stone history.  She was placed on Macrobid, advised increase fluid intake.  Discussed strict return precautions.  Plan follow-up with the PCP for any concerns.  Final Clinical Impressions(s) / ED Diagnoses   Final diagnoses:  Acute cystitis without hematuria    ED Discharge Orders         Ordered    nitrofurantoin, macrocrystal-monohydrate, (MACROBID) 100 MG capsule  2 times daily     04/28/19 2335           Burgess Amordol, Marinell Igarashi, PA-C 04/29/19 0009    Mesner, Barbara CowerJason, MD 04/29/19 [phone]

## 2019-05-15 ENCOUNTER — Emergency Department (HOSPITAL_COMMUNITY)
Admission: EM | Admit: 2019-05-15 | Discharge: 2019-05-15 | Discharge status: Against Medical Advice | Payer: Medicaid Other

## 2019-05-15 NOTE — ED Notes (Signed)
Pt called to triage and pt wanting to leave, IV had been placed by [facility] and was given IV benadryl. Pt wanting to leave and not be seen. IV removed in triage. Catheter intact and site wnl. dsg applied to site.

## 2019-05-18 ENCOUNTER — Other Ambulatory Visit: Payer: Self-pay

## 2019-05-18 ENCOUNTER — Emergency Department (HOSPITAL_COMMUNITY)
Admission: EM | Admit: 2019-05-18 | Discharge: 2019-05-18 | Disposition: A | Discharge status: Home / Self Care | Payer: Medicaid Other | Attending: Emergency Medicine | Admitting: Emergency Medicine

## 2019-05-18 ENCOUNTER — Encounter (HOSPITAL_COMMUNITY): Payer: Self-pay | Admitting: Emergency Medicine

## 2019-05-18 DIAGNOSIS — F17211 Nicotine dependence, cigarettes, in remission: Secondary | ICD-10-CM | POA: Diagnosis not present

## 2019-05-18 DIAGNOSIS — R569 Unspecified convulsions: Secondary | ICD-10-CM | POA: Insufficient documentation

## 2019-05-18 DIAGNOSIS — F17290 Nicotine dependence, other tobacco product, uncomplicated: Secondary | ICD-10-CM | POA: Insufficient documentation

## 2019-05-18 DIAGNOSIS — J45909 Unspecified asthma, uncomplicated: Secondary | ICD-10-CM | POA: Insufficient documentation

## 2019-05-18 LAB — CBC WITH DIFFERENTIAL/PLATELET
Abs Immature Granulocytes: 0.03 10*3/uL (ref 0.00–0.07)
Basophils Absolute: 0 10*3/uL (ref 0.0–0.1)
Basophils Relative: 0 %
Eosinophils Absolute: 0.3 10*3/uL (ref 0.0–0.5)
Eosinophils Relative: 3 %
HCT: 38.4 % (ref 36.0–46.0)
Hemoglobin: 12.6 g/dL (ref 12.0–15.0)
Immature Granulocytes: 0 %
Lymphocytes Relative: 34 %
Lymphs Abs: 3.1 10*3/uL (ref 0.7–4.0)
MCH: 30.4 pg (ref 26.0–34.0)
MCHC: 32.8 g/dL (ref 30.0–36.0)
MCV: 92.5 fL (ref 80.0–100.0)
Monocytes Absolute: 0.7 10*3/uL (ref 0.1–1.0)
Monocytes Relative: 8 %
Neutro Abs: 4.8 10*3/uL (ref 1.7–7.7)
Neutrophils Relative %: 55 %
Platelets: 206 10*3/uL (ref 150–400)
RBC: 4.15 MIL/uL (ref 3.87–5.11)
RDW: 12.6 % (ref 11.5–15.5)
WBC: 9 10*3/uL (ref 4.0–10.5)
nRBC: 0 % (ref 0.0–0.2)

## 2019-05-18 LAB — BASIC METABOLIC PANEL
Anion gap: 11 (ref 5–15)
BUN: 16 mg/dL (ref 6–20)
CO2: 23 mmol/L (ref 22–32)
Calcium: 9 mg/dL (ref 8.9–10.3)
Chloride: 108 mmol/L (ref 98–111)
Creatinine, Ser: 0.76 mg/dL (ref 0.44–1.00)
GFR calc Af Amer: >60 mL/min (ref >60)
GFR calc non Af Amer: >60 mL/min (ref >60)
Glucose, Bld: 77 mg/dL (ref 70–99)
Potassium: 3.4 mmol/L — ABNORMAL LOW (ref 3.5–5.1)
Sodium: 142 mmol/L (ref 135–145)

## 2019-05-18 MED ORDER — POTASSIUM CHLORIDE 10 MEQ/100ML IV SOLN
10.0000 meq | Freq: Once | INTRAVENOUS | Status: AC
  Administered 2019-05-18: 10 meq via INTRAVENOUS
  Filled 2019-05-18: qty 100

## 2019-05-18 MED ORDER — LEVETIRACETAM IN NACL 1000 MG/100ML IV SOLN
1000.0000 mg | Freq: Once | INTRAVENOUS | Status: AC
  Administered 2019-05-18: 1000 mg via INTRAVENOUS
  Filled 2019-05-18: qty 100

## 2019-05-18 MED ORDER — LORAZEPAM 2 MG/ML IJ SOLN: 2.0000 mg | INTRAMUSCULAR | Status: DC | PRN

## 2019-05-18 MED ORDER — POTASSIUM CHLORIDE CRYS ER 20 MEQ PO TBCR
40.0000 meq | EXTENDED_RELEASE_TABLET | Freq: Once | ORAL | Status: AC
  Administered 2019-05-18: 40 meq via ORAL
  Filled 2019-05-18: qty 2

## 2019-05-18 MED ORDER — LACTATED RINGERS IV BOLUS
1000.0000 mL | Freq: Once | INTRAVENOUS | Status: AC
  Administered 2019-05-18: 1000 mL via INTRAVENOUS

## 2019-05-18 NOTE — ED Provider Notes (Signed)
Emergency Department Provider Note   I have reviewed the triage vital signs and the nursing notes.   HISTORY  Chief Complaint Seizures   HPI Erin Krueger is a 23 y.o. female who presents the emergency department tonight with seizures.  Patient has a known history of epilepsy on Keppra which is just been recently increased.  Son that she had a seizure 24 hours prior to tonight but then tonight she had 4 time spent a 20-minute so EMS was called.  She had 1 or 2 episodes with them so they gave her some Versed and has had any episodes since that time.  Patient is awake and able to shake her head yes or no questions on arrival states no recent illnesses or new medications but is recently pregnant.  Last menstrual cycle was last month.   No other associated or modifying symptoms.    Past Medical History:  Diagnosis Date  . ADHD (attention deficit hyperactivity disorder)   . Allergy   . Anxiety   . Asthma   . Eating disorder   . Headache(784.0)   . Kidney stone    kidney stones  . Migraines   . ODD (oppositional defiant disorder)   . PID (acute pelvic inflammatory disease) 01/29/2016  . PTSD (post-traumatic stress disorder)   . Seizures [facility])    diagnosed age 89    Patient Active Problem List   Diagnosis Date Noted  . History of medication noncompliance   . Status epilepticus ([place]) 11/29/2018  . Asthma 11/29/2018  . Seizures ([place]) 08/28/2018  . Third trimester pregnancy   . Persistent UTI (urinary tract infection) during pregnancy 04/08/2018  . Marijuana use 04/07/2018  . Supervision of normal pregnancy Apr 11, 2018  . Smoker 04/11/18  . Death of child 04-11-18  . 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 Breakthrough seizure ([place]) 01/07/2016  . Seizure ([place]) 01/07/2016  . 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 PTSD (post-traumatic stress disorder) 05/12/2013  . 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 Polysubstance abuse ([place])  05/12/2013    Past Surgical History:  Procedure Laterality Date  . right knee surgery    . TONSILLECTOMY AND ADENOIDECTOMY      Current Outpatient Rx  . Order #: [id number] Class: Normal  . Order #: [id number] Class: Normal  . Order #: [id number] Class: Print  . Order #: [id number] Class: Normal    Allergies Bee venom, Cashew nut oil, Penicillins, Shellfish allergy, Lodine [etodolac], Prednisone, Cocoa butter, and Latex  Family History  Problem Relation Age of Onset  . Diabetes Mother   . Diabetes Sister   . Breast cancer Maternal Grandmother   . Diabetes Maternal Grandmother   . Glaucoma Maternal Grandmother     Social History Social History   Tobacco Use  . Smoking status: Current Every Day Smoker    Packs/day: 0.50    Types: Cigarettes, E-cigarettes  . Smokeless tobacco: Never Used  . Tobacco comment: Not currently since 02/13  Substance Use Topics  . Alcohol use: Yes    Comment: Not since 02/13  . Drug use: Never    Comment: Not since 02/13    Review of Systems  All other systems negative except as documented in the HPI. All pertinent positives and negatives as reviewed in the HPI. ____________________________________________   PHYSICAL EXAM:  VITAL SIGNS: ED Triage Vitals  Enc Vitals Group     BP 05/18/19 0034 101/72     Pulse Rate 05/18/19 0034 98     Resp 05/18/19 0034 (!)  21     Temp 05/18/19 0034 98.7 F (37.1 C)     Temp Source 05/18/19 0034 Oral     SpO2 05/18/19 0034 100 %     Weight --      Height --      Head Circumference --      Peak Flow --      Pain Score 05/18/19 0031 0     Pain Loc --      Pain Edu? --      Excl. in GC? --     Constitutional: Alert but sleepy. Well appearing and in no acute distress. Eyes: Conjunctivae are normal. PERRL. EOMI. Head: Atraumatic. Nose: No congestion/rhinnorhea. Mouth/Throat: Mucous membranes are moist.  Oropharynx non-erythematous. Neck: No stridor.  No meningeal signs.   Cardiovascular: Normal  rate, regular rhythm. Good peripheral circulation. Grossly normal heart sounds.   Respiratory: Normal respiratory effort.  No retractions. Lungs CTAB. Gastrointestinal: Soft and nontender. No distention.  Musculoskeletal: No lower extremity tenderness nor edema. No gross deformities of extremities. Neurologic:  Opens eyes, raises eyebrows, squeezes fingers and moves toes all symmetrically. Skin:  Skin is warm, dry and intact. No rash noted.  ____________________________________________   LABS (all labs ordered are listed, but only abnormal results are displayed)  Labs Reviewed  BASIC METABOLIC PANEL - Abnormal; Notable for the following components:      Result Value   Potassium 3.4 (*)    All other components within normal limits  CBC WITH DIFFERENTIAL/PLATELET  LEVETIRACETAM LEVEL   ____________________________________________  EKG   EKG Interpretation  Date/Time:  Thursday May 18 2019 00:33:36 EDT Ventricular Rate:  94 PR Interval:    QRS Duration: 100 QT Interval:  342 QTC Calculation: 428 R Axis:   82 Text Interpretation:  Sinus rhythm Borderline Q waves in inferior leads No significant change since last tracing Confirmed by Marily MemosMesner, Yenifer Saccente [phone]([id number]) on 05/18/2019 2:21:37 AM       ____________________________________________  RADIOLOGY  No results found.  ____________________________________________   PROCEDURES  Procedure(s) performed:   Procedures   ____________________________________________   INITIAL IMPRESSION / ASSESSMENT AND PLAN / ED COURSE  Breakthrough seizure. Will check keppra level, load keppra, check basic labs, observe for return to nromal.   Patient returned to baseline and states she felt much better with his eyes have a little of a headache which is normal for her after seizure.  [place] and also to get a refill of her sublingual Ativan that she usually gets for  prolonged seizures.  Stable for discharge at this time.  Pertinent labs & imaging results that were available during my care of the patient were reviewed by me and considered in my medical decision making (see chart for details).  A medical screening exam was performed and I feel the patient has had an appropriate workup for their chief complaint at this time and likelihood of emergent condition existing is low. They have been counseled on decision, discharge, follow up and which symptoms necessitate immediate return to the emergency department. They or their family verbally stated understanding and agreement with plan and discharged in stable condition.   ____________________________________________  FINAL CLINICAL IMPRESSION(S) / ED DIAGNOSES  Final diagnoses:  Seizure (HCC)     MEDICATIONS GIVEN DURING THIS VISIT:  Medications  LORazepam (ATIVAN) injection 2 mg (has no administration in time range)  levETIRAcetam (KEPPRA) IVPB 1000 mg/100 mL premix (0 mg Intravenous  Stopped 05/18/19 0102)  lactated ringers bolus 1,000 mL (0 mLs Intravenous Stopped 05/18/19 0154)  potassium chloride SA (K-DUR) CR tablet 40 mEq (40 mEq Oral Given 05/18/19 0439)  potassium chloride 10 mEq in 100 mL IVPB (0 mEq Intravenous Stopped 05/18/19 0434)     NEW OUTPATIENT MEDICATIONS STARTED DURING THIS VISIT:  Discharge Medication List as of 05/18/2019  4:30 AM      Note:  This note was prepared with assistance of Dragon voice recognition software. Occasional wrong-word or sound-a-like substitutions may have occurred due to the inherent limitations of voice recognition software.   Mack Alvidrez, Barbara CowerJason, MD 05/18/19 [phone]

## 2019-05-18 NOTE — ED Triage Notes (Signed)
Pt had 4 seizures at home per family and 2 enroute with ems. EMS gave 2.5mg  versed and pt is [redacted] weeks pregnant.

## 2019-05-19 ENCOUNTER — Encounter: Payer: Self-pay | Admitting: *Deleted

## 2019-05-20 LAB — LEVETIRACETAM LEVEL: Levetiracetam Lvl: 13.8 ug/mL (ref 10.0–40.0)

## 2019-05-23 ENCOUNTER — Emergency Department (HOSPITAL_COMMUNITY)
Admission: EM | Admit: 2019-05-23 | Discharge: 2019-05-24 | Disposition: A | Discharge status: Home / Self Care | Payer: Medicaid Other | Attending: Emergency Medicine | Admitting: Emergency Medicine

## 2019-05-23 ENCOUNTER — Encounter (HOSPITAL_COMMUNITY): Payer: Self-pay

## 2019-05-23 ENCOUNTER — Other Ambulatory Visit: Payer: Self-pay

## 2019-05-23 ENCOUNTER — Telehealth: Payer: Self-pay | Admitting: *Deleted

## 2019-05-23 DIAGNOSIS — Z79899 Other long term (current) drug therapy: Secondary | ICD-10-CM | POA: Diagnosis not present

## 2019-05-23 DIAGNOSIS — J45909 Unspecified asthma, uncomplicated: Secondary | ICD-10-CM | POA: Diagnosis not present

## 2019-05-23 DIAGNOSIS — Z91040 Latex allergy status: Secondary | ICD-10-CM | POA: Insufficient documentation

## 2019-05-23 DIAGNOSIS — F17210 Nicotine dependence, cigarettes, uncomplicated: Secondary | ICD-10-CM | POA: Diagnosis not present

## 2019-05-23 DIAGNOSIS — O2310 Infections of bladder in pregnancy, unspecified trimester: Secondary | ICD-10-CM | POA: Diagnosis not present

## 2019-05-23 DIAGNOSIS — Z3201 Encounter for pregnancy test, result positive: Secondary | ICD-10-CM

## 2019-05-23 DIAGNOSIS — R319 Hematuria, unspecified: Secondary | ICD-10-CM | POA: Diagnosis present

## 2019-05-23 DIAGNOSIS — N3001 Acute cystitis with hematuria: Secondary | ICD-10-CM

## 2019-05-23 LAB — URINALYSIS, ROUTINE W REFLEX MICROSCOPIC
Bilirubin Urine: NEGATIVE
Glucose, UA: NEGATIVE mg/dL
Ketones, ur: NEGATIVE mg/dL
Nitrite: POSITIVE — AB
Protein, ur: 30 mg/dL — AB
RBC / HPF: >50 RBC/hpf — ABNORMAL HIGH (ref 0–5)
Specific Gravity, Urine: 1.025 (ref 1.005–1.030)
pH: 6 (ref 5.0–8.0)

## 2019-05-23 LAB — PREGNANCY, URINE: Preg Test, Ur: POSITIVE — AB

## 2019-05-23 MED ORDER — NITROFURANTOIN MONOHYD MACRO 100 MG PO CAPS: 100.0000 mg | ORAL_CAPSULE | Freq: Two times a day (BID) | ORAL | 0 refills | Status: DC

## 2019-05-23 MED ORDER — NITROFURANTOIN MONOHYD MACRO 100 MG PO CAPS
100.0000 mg | ORAL_CAPSULE | Freq: Two times a day (BID) | ORAL | Status: DC
  Administered 2019-05-23: 100 mg via ORAL
  Filled 2019-05-23: qty 1

## 2019-05-23 MED ORDER — EPINEPHRINE 0.3 MG/0.3ML IJ SOAJ: 0.3000 mg | INTRAMUSCULAR | 1 refills | Status: DC | PRN

## 2019-05-23 NOTE — ED Provider Notes (Signed)
[facility]   CSN: [id number] Arrival date & time: 05/23/19  1949     History   Chief Complaint Chief Complaint  Patient presents with  . Hematuria    HPI Erin Krueger is a 23 y.o. female.     HPI   Erin Krueger is a 23 y.o. female with hx of seizures, asthma, migraine headaches and kidney stones. She presents to the Emergency Department complaining of urinary frequency and hematuria with possible pregnancy.  She  had one positive home pregnancy test and contacted [facility].  She has an appt for a tele visit tomorrow.  She comes to the ER tonight because she noticed blood in her urine this evening. She also complains of increased urinary frequency and only voiding small amounts.  She noticed bright red blood in her urine today and on the tissue when wiping post void.  She was seen here earlier this month and diagnosed with cystitis.  She was started on nitrofurantoin and she states she finished her medication but symptoms have returned. States that she typically keeps UTIs when she's pregnant.  She complains of some mild lower abdominal cramping that is associated with voiding.  She denies back or flank pain, vaginal bleeding, fever, chills, vomiting.       Past Medical History:  Diagnosis Date  . ADHD (attention deficit hyperactivity disorder)   . Allergy   . Anxiety   . Asthma   . Eating disorder   . Headache(784.0)   . Kidney stone    kidney stones  . Migraines   . ODD (oppositional defiant disorder)   . PID (acute pelvic inflammatory disease) 01/29/2016  . PTSD (post-traumatic stress disorder)   . Seizures [facility](HCC)    diagnosed age 89    Patient Active Problem List   Diagnosis Date Noted  . History of medication noncompliance   . Status epilepticus (HCC) 11/29/2018  . Asthma 11/29/2018  . Seizures (HCC) 08/28/2018  . Third trimester pregnancy   . Persistent UTI (urinary tract infection) during pregnancy 04/08/2018  . Marijuana  use 04/07/2018  . Supervision of normal pregnancy 04/05/2018  . Smoker 04/05/2018  . Death of child 04/05/2018  . 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 Breakthrough seizure (HCC) 01/07/2016  . Seizure (HCC) 01/07/2016  . 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 PTSD (post-traumatic stress disorder) 05/12/2013  . 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 Polysubstance abuse (HCC) 05/12/2013    Past Surgical History:  Procedure Laterality Date  . right knee surgery    . TONSILLECTOMY AND ADENOIDECTOMY       OB History    Gravida  4   Para  2   Term  2   Preterm      AB  1   Living  1     SAB  1   TAB      Ectopic      Multiple  0   Live Births  2            Home Medications    Prior to Admission medications   Medication Sig Start Date End Date Taking? Authorizing Provider  albuterol (PROVENTIL HFA;VENTOLIN HFA) 108 (90 Base) MCG/ACT inhaler Inhale 2 puffs into the lungs every 6 (six) hours as needed for wheezing or shortness of breath. 04/05/18   Cheral MarkerBooker, Kimberly R, CNM  EPINEPHrine 0.3 mg/0.3 mL IJ SOAJ injection Inject 0.3 mLs (0.3 mg total) into the muscle as needed. 05/23/19  Derrek Monaco A, NP  levETIRAcetam (KEPPRA) 1000 MG tablet Take 1 tablet (1,000 mg total) by mouth 2 (two) times daily for 30 days. 04/26/19 05/26/19  Darlin Drop P, PA-C  nitrofurantoin, macrocrystal-monohydrate, (MACROBID) 100 MG capsule Take 1 capsule (100 mg total) by mouth 2 (two) times daily. 04/28/19   Evalee Jefferson, PA-C    Family History Family History  Problem Relation Age of Onset  . Diabetes Mother   . Diabetes Sister   . Breast cancer Maternal Grandmother   . Diabetes Maternal Grandmother   . Glaucoma Maternal Grandmother     Social History Social History   Tobacco Use  . Smoking status: Current Every Day Smoker    Packs/day: 0.50    Types: Cigarettes, E-cigarettes  . Smokeless tobacco: Never Used  . Tobacco comment:  Not currently since 02/13  Substance Use Topics  . Alcohol use: Yes    Comment: Not since 02/13  . Drug use: Never    Comment: Not since 02/13     Allergies   Bee venom, Cashew nut oil, Penicillins, Shellfish allergy, Lodine [etodolac], Prednisone, Cocoa butter, and Latex   Review of Systems Review of Systems  Constitutional: Negative for activity change, appetite change, chills and fever.  Respiratory: Negative for chest tightness and shortness of breath.   Cardiovascular: Negative for chest pain.  Gastrointestinal: Negative for abdominal pain (lower abdominal cramping associated with voiding), constipation, diarrhea, nausea and vomiting.  Genitourinary: Positive for dysuria, frequency and urgency. Negative for decreased urine volume, difficulty urinating, flank pain, hematuria and vaginal discharge.  Musculoskeletal: Negative for back pain.  Skin: Negative for rash.  Neurological: Negative for dizziness, weakness and numbness.  Hematological: Negative for adenopathy.  Psychiatric/Behavioral: Negative for confusion.     Physical Exam Updated Vital Signs BP 114/84 (BP Location: Right Arm)   Pulse 91   Temp 98.8 F (37.1 C) (Oral)   Resp 18   Ht 5\' 7"  (1.702 m)   Wt 77.1 kg   LMP 04/12/2019 (Approximate)   SpO2 99%   BMI 26.63 kg/m   Physical Exam Vitals signs and nursing note reviewed.  Constitutional:      Appearance: Normal appearance. She is not ill-appearing or toxic-appearing.  HENT:     Mouth/Throat:     Mouth: Mucous membranes are moist.     Pharynx: Oropharynx is clear.  Cardiovascular:     Rate and Rhythm: Normal rate and regular rhythm.  Pulmonary:     Effort: Pulmonary effort is normal.     Breath sounds: Normal breath sounds.  Abdominal:     General: There is no distension.     Palpations: Abdomen is soft.     Tenderness: There is no abdominal tenderness. There is no right CVA tenderness, left CVA tenderness or guarding.  Musculoskeletal:      Right lower leg: No edema.     Left lower leg: No edema.  Skin:    General: Skin is warm.     Capillary Refill: Capillary refill takes less than 2 seconds.     Findings: No rash.  Neurological:     General: No focal deficit present.     Mental Status: She is alert.      ED Treatments / Results  Labs (all labs ordered are listed, but only abnormal results are displayed) Labs Reviewed  URINALYSIS, ROUTINE W REFLEX MICROSCOPIC - Abnormal; Notable for the following components:      Result Value   APPearance CLOUDY (*)  Hgb urine dipstick LARGE (*)    Protein, ur 30 (*)    Nitrite POSITIVE (*)    Leukocytes,Ua SMALL (*)    RBC / HPF >50 (*)    Bacteria, UA MANY (*)    All other components within normal limits  PREGNANCY, URINE - Abnormal; Notable for the following components:   Preg Test, Ur POSITIVE (*)    All other components within normal limits  URINE CULTURE    EKG None  Radiology No results found.  Procedures Procedures (including critical care time)  Medications Ordered in ED Medications - No data to display   Initial Impression / Assessment and Plan / ED Course  I have reviewed the triage vital signs and the nursing notes.  Pertinent labs & imaging results that were available during my care of the patient were reviewed by me and considered in my medical decision making (see chart for details).        Well-appearing patient.  Vital signs reassuring.  No CVA tenderness, fever, vomiting to suggest kidney stone or pyelonephritis.  Urinalysis shows likely cystitis, + hematuria, no vaginal bleeding.  No previous urine culture for comparison.  I will order urine culture tonight and start antibiotics. She describes some abdominal cramping associated with voiding.  No abdominal tenderness on my exam and doubt ectopic.  She has hx of anaphylactic rxn with PCN, so I will start her on macrobid until culture results.  She has a tele visit tomorrow with [facility].   Strict return precautions discussed.     Final Clinical Impressions(s) / ED Diagnoses   Final diagnoses:  Acute cystitis with hematuria  Positive pregnancy test    ED Discharge Orders    None       Pauline Ausriplett, Nastashia Gallo, PA-C 05/24/19 0001    Long, Arlyss RepressJoshua G, MD 05/24/19 [phone]

## 2019-05-23 NOTE — Telephone Encounter (Signed)
Refill epi pen  

## 2019-05-23 NOTE — Telephone Encounter (Signed)
Pt is requesting a refill on Epinephrine. Thanks!! [place]

## 2019-05-23 NOTE — Discharge Instructions (Addendum)
Drink plenty of water.  Take the antibiotic as directed until its finished.  Your pregnancy test here tonight was positive.  By your last LMP you are early on in your pregnancy.  [facility] your app[facility] for tomorrow.  Return here for any worsening symptoms such as abdominal pain, vaginal bleeding, or fever

## 2019-05-23 NOTE — ED Triage Notes (Signed)
Pt reports blood in urine that started today. Pt took home pregnancy test and it was positive. Pt has OB/GYN appt. Tomorrow via telephone. Pt reports abdominal cramping (periumbilical) that has been going on for past two days. Denies any urgency, frequency, or dysuria.

## 2019-05-24 ENCOUNTER — Other Ambulatory Visit: Payer: Self-pay

## 2019-05-24 ENCOUNTER — Ambulatory Visit (INDEPENDENT_AMBULATORY_CARE_PROVIDER_SITE_OTHER): Payer: Medicaid Other | Admitting: Adult Health

## 2019-05-24 ENCOUNTER — Encounter: Payer: Self-pay | Admitting: Adult Health

## 2019-05-24 VITALS — Ht 67.0 in | Wt 170.0 lb

## 2019-05-24 DIAGNOSIS — Z3201 Encounter for pregnancy test, result positive: Secondary | ICD-10-CM | POA: Diagnosis not present

## 2019-05-24 DIAGNOSIS — O3680X0 Pregnancy with inconclusive fetal viability, not applicable or unspecified: Secondary | ICD-10-CM | POA: Insufficient documentation

## 2019-05-24 DIAGNOSIS — Z87898 Personal history of other specified conditions: Secondary | ICD-10-CM | POA: Insufficient documentation

## 2019-05-24 MED ORDER — FOLIC ACID 1 MG PO TABS: ORAL_TABLET | ORAL | 99 refills | Status: DC

## 2019-05-24 NOTE — Progress Notes (Signed)
Patient ID: Erin Krueger, female   DOB: Oct 24, 1996, 23 y.o.   MRN: [id number]   TELEHEALTH VIRTUAL GYNECOLOGY VISIT ENCOUNTER NOTE  I connected with Erin Krueger on 05/24/19 at  9:45 AM EDT by telephone at home and verified that I am speaking with the correct person using two identifiers.   I discussed the limitations, risks, security and privacy concerns of performing an evaluation and management service by telephone and the availability of in person appointments. I also discussed with the patient that there may be a patient responsible charge related to this service. The patient expressed understanding and agreed to proceed.   History:  Erin Krueger is a 23 y.o. [phone] female being evaluated today for missed period and had +HPT and +UPT in ER, last night when treated for UTI. She is about 6 weeks by LMP 04/12/19 with EDD 01/17/20. She denies any abnormal vaginal discharge, bleeding, pelvic pain or other concerns.    PCP is [facility].    Past Medical History:  Diagnosis Date  . ADHD (attention deficit hyperactivity disorder)   . Allergy   . Anxiety   . Asthma   . Eating disorder   . Headache(784.0)   . Kidney stone    kidney stones  . Migraines   . ODD (oppositional defiant disorder)   . PID (acute pelvic inflammatory disease) 01/29/2016  . PTSD (post-traumatic stress disorder)   . Seizures (HCC)    diagnosed age 89   Past Surgical History:  Procedure Laterality Date  . right knee surgery    . TONSILLECTOMY AND ADENOIDECTOMY     The following portions of the patient's history were reviewed and updated as appropriate: allergies, current medications, past family history, past medical history, past social history, past surgical history and problem list.   Health Maintenance:  Normal pap on 04/05/18.  Review of Systems:  Pertinent items noted in HPI and remainder of comprehensive ROS otherwise negative.  Physical Exam:   General:  Alert, oriented and cooperative.   Mental  Status: Normal mood and affect perceived. Normal judgment and thought content.  Physical exam deferred due to nature of the encounter Ht 5\' 7"  (1.702 m)   Wt 170 lb (77.1 kg)   LMP 04/12/2019 (Approximate)   Breastfeeding No   BMI 26.63 kg/m per pt Fall risk is low.   Labs and Imaging Results for orders placed or performed during the hospital encounter of 05/23/19 (from the past 336 hour(s))  Urinalysis, Routine w reflex microscopic- may I&O cath if menses   Collection Time: 05/23/19  8:37 PM  Result Value Ref Range   Color, Urine YELLOW YELLOW   APPearance CLOUDY (A) CLEAR   Specific Gravity, Urine 1.025 1.005 - 1.030   pH 6.0 5.0 - 8.0   Glucose, UA NEGATIVE NEGATIVE mg/dL   Hgb urine dipstick LARGE (A) NEGATIVE   Bilirubin Urine NEGATIVE NEGATIVE   Ketones, ur NEGATIVE NEGATIVE mg/dL   Protein, ur 30 (A) NEGATIVE mg/dL   Nitrite POSITIVE (A) NEGATIVE   Leukocytes,Ua SMALL (A) NEGATIVE   RBC / HPF >50 (H) 0 - 5 RBC/hpf   WBC, UA 6-10 0 - 5 WBC/hpf   Bacteria, UA MANY (A) NONE SEEN   Squamous Epithelial / LPF 6-10 0 - 5   WBC Clumps PRESENT    Mucus PRESENT   Pregnancy, urine   Collection Time: 05/23/19  9:04 PM  Result Value Ref Range   Preg Test, Ur POSITIVE (A) NEGATIVE  Results for orders placed or  performed during the hospital encounter of 05/18/19 (from the past 336 hour(s))  CBC with Differential   Collection Time: 05/18/19 12:52 AM  Result Value Ref Range   WBC 9.0 4.0 - 10.5 K/uL   RBC 4.15 3.87 - 5.11 MIL/uL   Hemoglobin 12.6 12.0 - 15.0 g/dL   HCT 38.4 36.0 - 46.0 %   MCV 92.5 80.0 - 100.0 fL   MCH 30.4 26.0 - 34.0 pg   MCHC 32.8 30.0 - 36.0 g/dL   RDW 12.6 11.5 - 15.5 %   Platelets 206 150 - 400 K/uL   nRBC 0.0 0.0 - 0.2 %   Neutrophils Relative % 55 %   Neutro Abs 4.8 1.7 - 7.7 K/uL   Lymphocytes Relative 34 %   Lymphs Abs 3.1 0.7 - 4.0 K/uL   Monocytes Relative 8 %   Monocytes Absolute 0.7 0.1 - 1.0 K/uL   Eosinophils Relative 3 %   Eosinophils  Absolute 0.3 0.0 - 0.5 K/uL   Basophils Relative 0 %   Basophils Absolute 0.0 0.0 - 0.1 K/uL   Immature Granulocytes 0 %   Abs Immature Granulocytes 0.03 0.00 - 0.07 K/uL  Basic metabolic panel   Collection Time: 05/18/19 12:52 AM  Result Value Ref Range   Sodium 142 135 - 145 mmol/L   Potassium 3.4 (L) 3.5 - 5.1 mmol/L   Chloride 108 98 - 111 mmol/L   CO2 23 22 - 32 mmol/L   Glucose, Bld 77 70 - 99 mg/dL   BUN 16 6 - 20 mg/dL   Creatinine, Ser 0.76 0.44 - 1.00 mg/dL   Calcium 9.0 8.9 - 10.3 mg/dL   GFR calc non Af Amer >60 >60 mL/min   GFR calc Af Amer >60 >60 mL/min   Anion gap 11 5 - 15  Levetiracetam level   Collection Time: 05/18/19 12:52 AM  Result Value Ref Range   Levetiracetam Lvl 13.8 10.0 - 40.0 ug/mL   No results found.    Assessment and Plan:     1. Positive pregnancy test 1+HPT and + in ER   2. Encounter to determine fetal viability of pregnancy, single or unspecified fetus Dating [place] in about 2 weeks   3. History of seizures -take folic acid 4 mg daily       I discussed the assessment and treatment plan with the patient. The patient was provided an opportunity to ask questions and all were answered. The patient agreed with the plan and demonstrated an understanding of the instructions.   The patient was advised to call back or seek an in-person evaluation/go to the ED if the symptoms worsen or if the condition fails to improve as anticipated.  I provided 8 minutes of non-face-to-face time during this encounter.   Derrek Monaco, NP [facility], [place]

## 2019-05-24 NOTE — ED Notes (Signed)
Pt ambulatory to waiting room. Pt verbalized understanding of discharge instructions.   

## 2019-05-26 LAB — URINE CULTURE: Culture: >=100000 — AB

## 2019-05-27 ENCOUNTER — Telehealth: Payer: Self-pay | Admitting: Emergency Medicine

## 2019-05-27 NOTE — Telephone Encounter (Signed)
Post ED Visit - Positive Culture Follow-up  Culture report reviewed by antimicrobial stewardship pharmacist: [place] Team []  Elenor Quinones, Pharm.D. []  Heide Guile, Pharm.D., BCPS AQ-ID []  Parks Neptune, Pharm.D., BCPS []  Alycia Rossetti, Pharm.D., BCPS []  [place], Pharm.D., BCPS, AAHIVP []  Legrand Como, Pharm.D., BCPS, AAHIVP [x]  Salome Arnt, PharmD, BCPS []  Johnnette Gourd, PharmD, BCPS []  Hughes Better, PharmD, BCPS []  Leeroy Cha, PharmD []  Laqueta Linden, PharmD, BCPS []  Albertina Parr, PharmD  [place] Team []  Leodis Sias, PharmD []  Lindell Spar, PharmD []  Royetta Asal, PharmD []  Graylin Shiver, Rph []  Rema Fendt) Glennon Mac, PharmD []  Arlyn Dunning, PharmD []  Netta Cedars, PharmD []  Dia Sitter, PharmD []  Leone Haven, PharmD []  Gretta Arab, PharmD []  Theodis Shove, PharmD []  Peggyann Juba, PharmD []  Reuel Boom, PharmD   Positive urine culture Treated with Nitrofurantoin, organism sensitive to the same and no further patient follow-up is required at this time.  Sandi Raveling Mico Spark 05/27/2019, 12:44 PM

## 2019-06-01 ENCOUNTER — Other Ambulatory Visit: Payer: Self-pay | Admitting: Adult Health

## 2019-06-01 MED ORDER — FOLIC ACID 1 MG PO TABS: ORAL_TABLET | ORAL | 99 refills | Status: DC

## 2019-06-01 NOTE — Progress Notes (Signed)
Folic acid 4 mg daily

## 2019-06-07 ENCOUNTER — Other Ambulatory Visit: Payer: Self-pay

## 2019-06-07 ENCOUNTER — Ambulatory Visit (INDEPENDENT_AMBULATORY_CARE_PROVIDER_SITE_OTHER): Payer: Medicaid Other

## 2019-06-07 DIAGNOSIS — O3680X0 Pregnancy with inconclusive fetal viability, not applicable or unspecified: Secondary | ICD-10-CM | POA: Diagnosis not present

## 2019-06-07 DIAGNOSIS — Z3A08 8 weeks gestation of pregnancy: Secondary | ICD-10-CM

## 2019-06-07 NOTE — Progress Notes (Signed)
[place] 6+3 wks,single IUP w/ys,positive fht 118 bpm,normal ovaries bilat,crl 6.02 mm

## 2019-06-09 ENCOUNTER — Inpatient Hospital Stay (HOSPITAL_COMMUNITY): Payer: Medicaid Other

## 2019-06-09 ENCOUNTER — Inpatient Hospital Stay (EMERGENCY_DEPARTMENT_HOSPITAL)
Admission: AD | Admit: 2019-06-09 | Discharge: 2019-06-09 | Disposition: A | Discharge status: Home / Self Care | Payer: Medicaid Other | Source: Home / Self Care | Attending: Obstetrics and Gynecology | Admitting: Obstetrics and Gynecology

## 2019-06-09 ENCOUNTER — Encounter (HOSPITAL_COMMUNITY): Payer: Self-pay | Admitting: Emergency Medicine

## 2019-06-09 ENCOUNTER — Encounter (HOSPITAL_COMMUNITY): Payer: Self-pay

## 2019-06-09 ENCOUNTER — Emergency Department (HOSPITAL_COMMUNITY)
Admission: EM | Admit: 2019-06-09 | Discharge: 2019-06-09 | Disposition: A | Discharge status: Home / Self Care | Payer: Medicaid Other | Attending: Emergency Medicine | Admitting: Emergency Medicine

## 2019-06-09 ENCOUNTER — Other Ambulatory Visit: Payer: Self-pay

## 2019-06-09 DIAGNOSIS — F17210 Nicotine dependence, cigarettes, uncomplicated: Secondary | ICD-10-CM | POA: Insufficient documentation

## 2019-06-09 DIAGNOSIS — Z87442 Personal history of urinary calculi: Secondary | ICD-10-CM | POA: Insufficient documentation

## 2019-06-09 DIAGNOSIS — R1030 Lower abdominal pain, unspecified: Secondary | ICD-10-CM | POA: Insufficient documentation

## 2019-06-09 DIAGNOSIS — Z3A01 Less than 8 weeks gestation of pregnancy: Secondary | ICD-10-CM | POA: Insufficient documentation

## 2019-06-09 DIAGNOSIS — T7840XA Allergy, unspecified, initial encounter: Secondary | ICD-10-CM | POA: Insufficient documentation

## 2019-06-09 DIAGNOSIS — O23599 Infection of other part of genital tract in pregnancy, unspecified trimester: Secondary | ICD-10-CM | POA: Diagnosis not present

## 2019-06-09 DIAGNOSIS — W57XXXA Bitten or stung by nonvenomous insect and other nonvenomous arthropods, initial encounter: Secondary | ICD-10-CM | POA: Insufficient documentation

## 2019-06-09 DIAGNOSIS — O23591 Infection of other part of genital tract in pregnancy, first trimester: Secondary | ICD-10-CM | POA: Diagnosis not present

## 2019-06-09 DIAGNOSIS — A5901 Trichomonal vulvovaginitis: Secondary | ICD-10-CM

## 2019-06-09 DIAGNOSIS — Z888 Allergy status to other drugs, medicaments and biological substances status: Secondary | ICD-10-CM | POA: Insufficient documentation

## 2019-06-09 DIAGNOSIS — O26899 Other specified pregnancy related conditions, unspecified trimester: Secondary | ICD-10-CM

## 2019-06-09 DIAGNOSIS — Z91040 Latex allergy status: Secondary | ICD-10-CM | POA: Insufficient documentation

## 2019-06-09 DIAGNOSIS — R0600 Dyspnea, unspecified: Secondary | ICD-10-CM | POA: Diagnosis present

## 2019-06-09 DIAGNOSIS — Z87891 Personal history of nicotine dependence: Secondary | ICD-10-CM | POA: Insufficient documentation

## 2019-06-09 DIAGNOSIS — R109 Unspecified abdominal pain: Secondary | ICD-10-CM | POA: Diagnosis not present

## 2019-06-09 DIAGNOSIS — R569 Unspecified convulsions: Secondary | ICD-10-CM | POA: Insufficient documentation

## 2019-06-09 DIAGNOSIS — J45909 Unspecified asthma, uncomplicated: Secondary | ICD-10-CM | POA: Diagnosis not present

## 2019-06-09 DIAGNOSIS — Z880 Allergy status to penicillin: Secondary | ICD-10-CM | POA: Insufficient documentation

## 2019-06-09 DIAGNOSIS — O26891 Other specified pregnancy related conditions, first trimester: Secondary | ICD-10-CM

## 2019-06-09 DIAGNOSIS — O98311 Other infections with a predominantly sexual mode of transmission complicating pregnancy, first trimester: Secondary | ICD-10-CM | POA: Insufficient documentation

## 2019-06-09 DIAGNOSIS — Z79899 Other long term (current) drug therapy: Secondary | ICD-10-CM | POA: Insufficient documentation

## 2019-06-09 LAB — WET PREP, GENITAL
Sperm: NONE SEEN
Yeast Wet Prep HPF POC: NONE SEEN

## 2019-06-09 LAB — CBC
HCT: 37.3 % (ref 36.0–46.0)
Hemoglobin: 12.4 g/dL (ref 12.0–15.0)
MCH: 29.9 pg (ref 26.0–34.0)
MCHC: 33.2 g/dL (ref 30.0–36.0)
MCV: 89.9 fL (ref 80.0–100.0)
Platelets: 213 10*3/uL (ref 150–400)
RBC: 4.15 MIL/uL (ref 3.87–5.11)
RDW: 12.2 % (ref 11.5–15.5)
WBC: 11.6 10*3/uL — ABNORMAL HIGH (ref 4.0–10.5)
nRBC: 0 % (ref 0.0–0.2)

## 2019-06-09 LAB — URINALYSIS, ROUTINE W REFLEX MICROSCOPIC
Bacteria, UA: NONE SEEN
Bilirubin Urine: NEGATIVE
Glucose, UA: NEGATIVE mg/dL
Hgb urine dipstick: NEGATIVE
Ketones, ur: NEGATIVE mg/dL
Nitrite: NEGATIVE
Protein, ur: NEGATIVE mg/dL
Specific Gravity, Urine: 1.017 (ref 1.005–1.030)
pH: 7 (ref 5.0–8.0)

## 2019-06-09 LAB — HCG, QUANTITATIVE, PREGNANCY: hCG, Beta Chain, Quant, S: 101382 m[IU]/mL — ABNORMAL HIGH (ref <5)

## 2019-06-09 IMAGING — US OBSTETRIC <14 WK US AND TRANSVAGINAL OB US
1 series · 15 of 28 positions shown · non-contrast
Comparison: Early OB ultrasound [DATE] from [HOSPITAL] OB-GYN.

CLINICAL DATA: 23-year-old G4 P2 AB 1, assigned gestational age of
6 weeks 5 days based on an in office ultrasound 2 days ago,
presenting with lower abdominal pain.

EXAM:
OBSTETRIC <14 WK US AND TRANSVAGINAL OB US
TECHNIQUE: Both transabdominal and transvaginal ultrasound examinations were
performed for complete evaluation of the gestation as well as the
maternal uterus, adnexal regions, and pelvic cul-de-sac.
Transvaginal technique was performed to assess early pregnancy.

[Series 1: obstetric <14 wk us and transvaginal ob us · 15 of 63 slices shown]
[im 1/63]
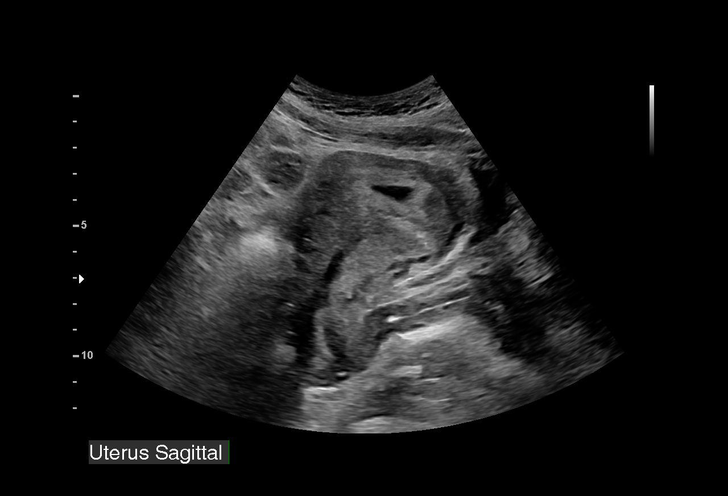
[im 5/63]
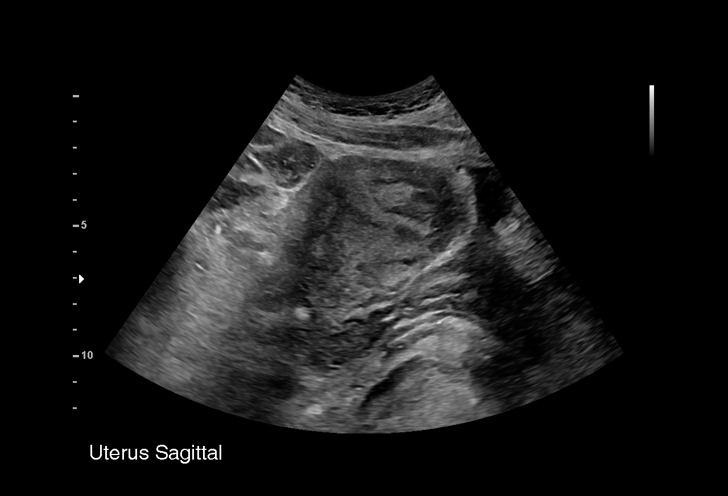
[im 10/63]
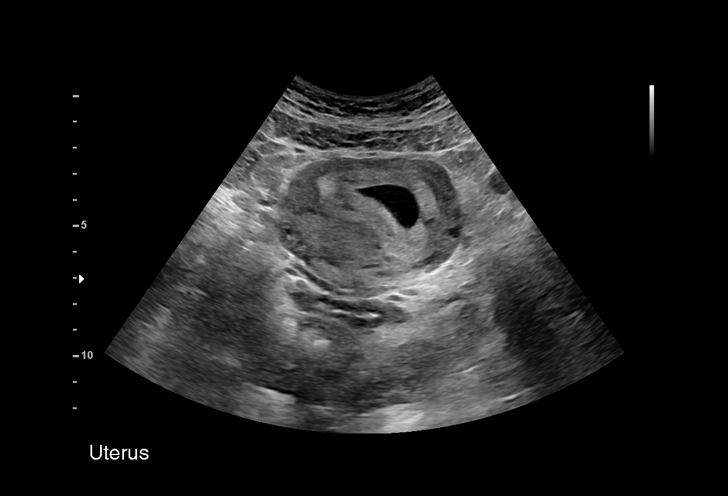
[im 14/63]
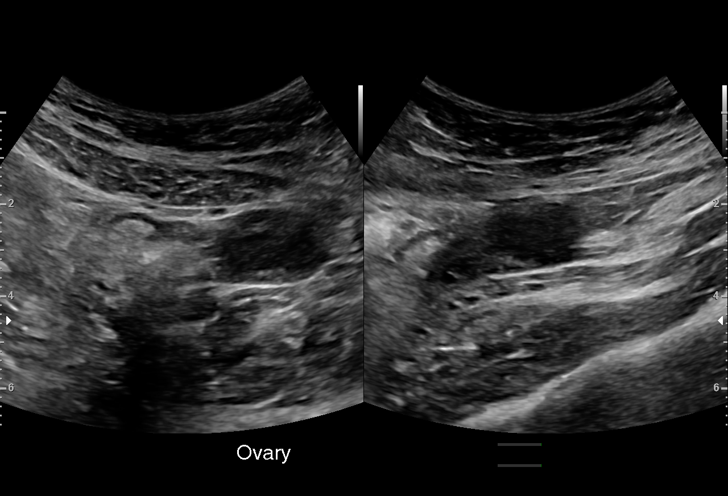
[im 19/63]
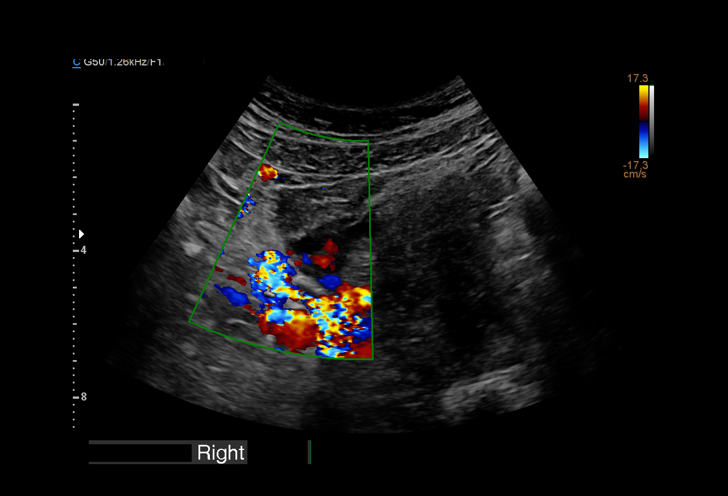
[im 23/63]
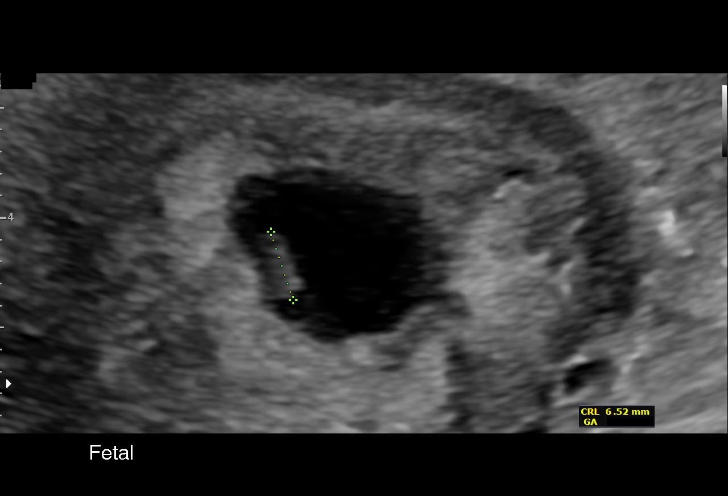
[im 28/63]
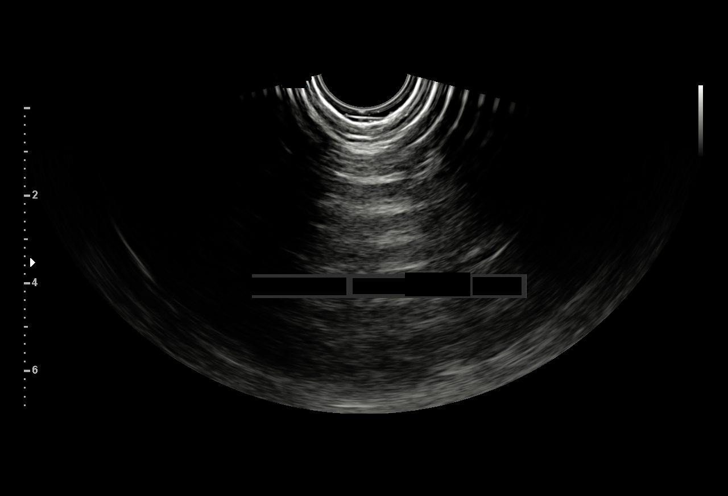
[im 33/63]
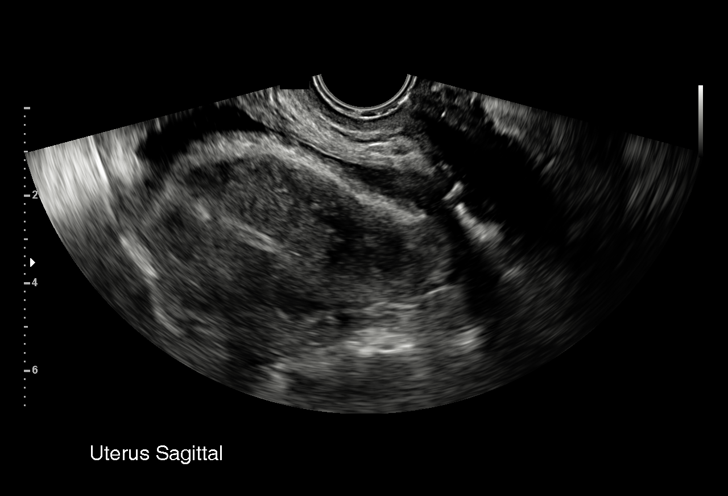
[im 35/63]
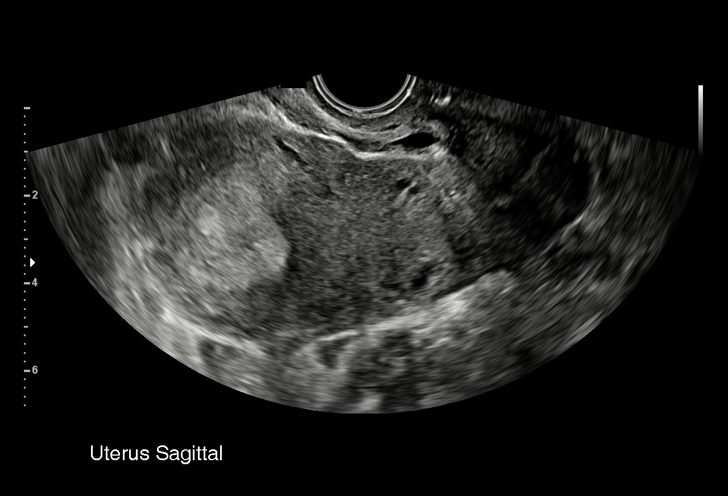
[im 40/63]
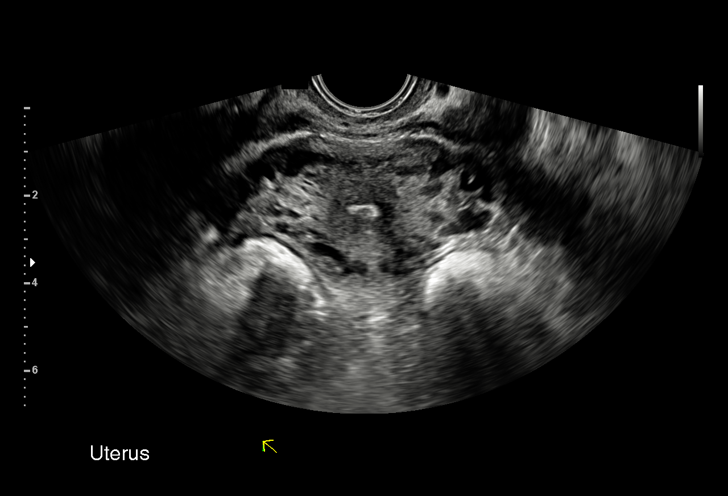
[im 44/63]
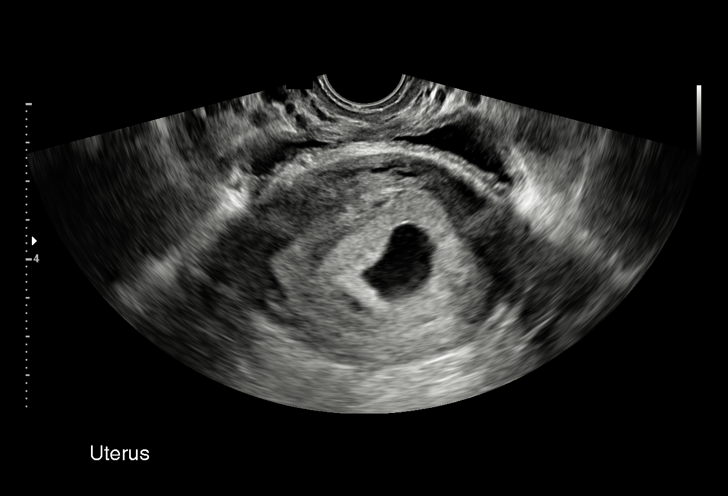
[im 49/63]
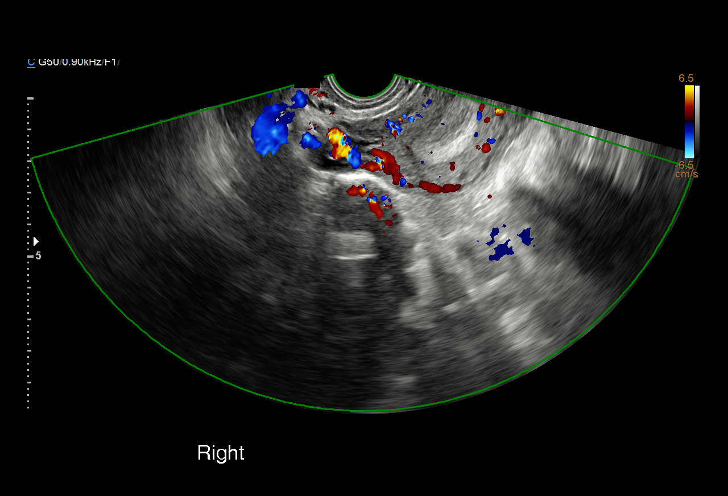
[im 53/63]
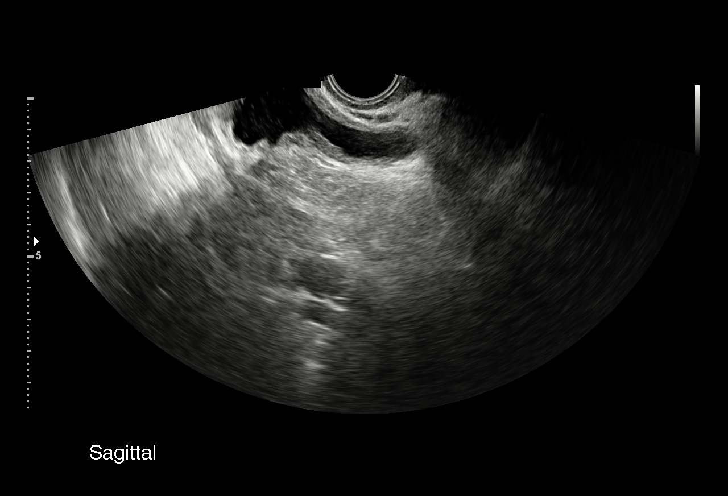
[im 58/63]
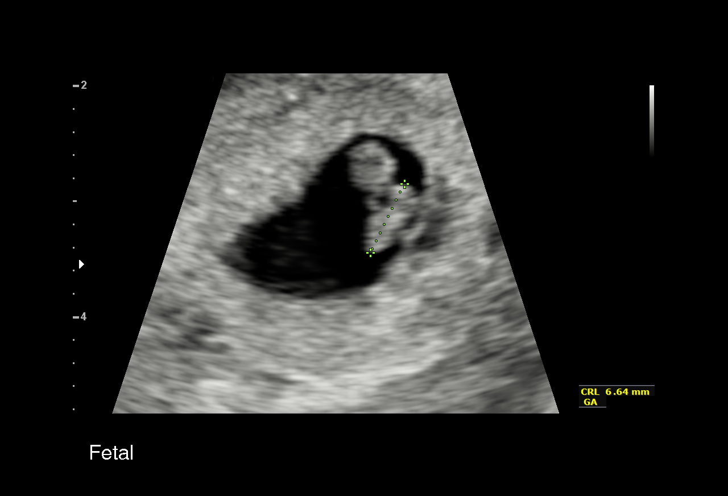
[im 63/63]
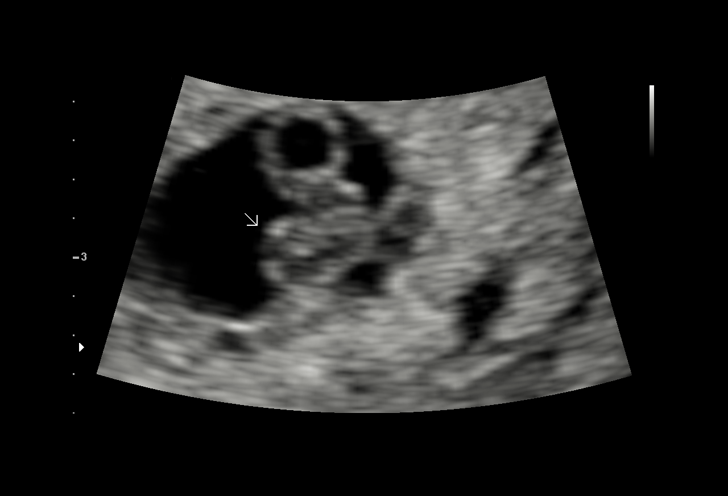

[15 of 28 positions shown; findings below may reference images not displayed]

FINDINGS: Intrauterine gestational sac: Single.

Yolk sac:  Present.

Embryo:  Present.

Cardiac Activity: Present.

Heart Rate: 122 bpm

CRL:  6.8 mm   6 w   3 d                  US EDC: [DATE]

Subchorionic hemorrhage: Small subchorionic hemorrhage measuring
approximately 0.4 x 1.9 x 1.6 cm, unchanged from the ultrasound 2
days ago.

Maternal uterus/adnexae: Normal-appearing ovaries bilaterally which
were only visible on the transabdominal examination. No free pelvic
fluid.
IMPRESSION: 1. Single live intrauterine embryo with estimated gestational age 6
weeks 3 days by crown-rump length. EDC [DATE].
2. Small subchorionic hemorrhage measured above.

## 2019-06-09 MED ORDER — METRONIDAZOLE 500 MG PO TABS
2000.0000 mg | ORAL_TABLET | Freq: Once | ORAL | Status: AC
  Administered 2019-06-09: 2000 mg via ORAL
  Filled 2019-06-09: qty 4

## 2019-06-09 MED ORDER — ALBUTEROL SULFATE HFA 108 (90 BASE) MCG/ACT IN AERS
2.0000 | INHALATION_SPRAY | Freq: Once | RESPIRATORY_TRACT | Status: AC
  Administered 2019-06-09: 02:00:00 2 via RESPIRATORY_TRACT
  Filled 2019-06-09: qty 6.7

## 2019-06-09 MED ORDER — EPINEPHRINE 0.3 MG/0.3ML IJ SOAJ
0.3000 mg | Freq: Once | INTRAMUSCULAR | Status: AC
  Administered 2019-06-09: 02:00:00 0.3 mg via INTRAMUSCULAR
  Filled 2019-06-09: qty 0.3

## 2019-06-09 MED ORDER — METHYLPREDNISOLONE SODIUM SUCC 125 MG IJ SOLR
125.0000 mg | Freq: Once | INTRAMUSCULAR | Status: AC
  Administered 2019-06-09: 02:00:00 125 mg via INTRAVENOUS
  Filled 2019-06-09: qty 2

## 2019-06-09 MED ORDER — EPINEPHRINE 0.3 MG/0.3ML IJ SOAJ: 0.3000 mg | INTRAMUSCULAR | 1 refills | Status: DC | PRN

## 2019-06-09 MED ORDER — DIPHENHYDRAMINE HCL 50 MG/ML IJ SOLN
25.0000 mg | Freq: Once | INTRAMUSCULAR | Status: AC
  Administered 2019-06-09: 25 mg via INTRAVENOUS
  Filled 2019-06-09: qty 1

## 2019-06-09 NOTE — MAU Note (Signed)
Pt states that she called her on call nurse and she states she has had a uti for almost the entire pregnancy. Pt states that she has been given several different medications to treat it, but none have completely gotten rid of it. The nurse told her to come in and be seen.   Pt states the pain is so bad that she throws up everything she eats and drinks

## 2019-06-09 NOTE — ED Notes (Signed)
Pt on phone 

## 2019-06-09 NOTE — Discharge Instructions (Signed)
Trichomoniasis Trichomoniasis is an STI (sexually transmitted infection) that can affect both women and men. In women, the outer area of the female genitalia (vulva) and the vagina are affected. In men, mainly the penis is affected, but the prostate and other reproductive organs can also be involved.  This condition can be treated with medicine. It often has no symptoms (is asymptomatic), especially in men. If not treated, trichomoniasis can last for months or years. What are the causes? This condition is caused by a parasite called Trichomonas vaginalis. Trichomoniasis most often spreads from person to person (is contagious) through sexual contact. What increases the risk? The following factors may make you more likely to develop this condition:  Having unprotected sex.  Having sex with a partner who has trichomoniasis.  Having multiple sexual partners.  Having had previous trichomoniasis infections or other STIs. What are the signs or symptoms? In women, symptoms of trichomoniasis include:  Abnormal vaginal discharge that is clear, white, gray, or yellow-green and foamy and has an unusual "fishy" odor.  Itching and irritation of the vagina and vulva.  Burning or pain during urination or sex.  Redness and swelling of the genitals. In men, symptoms of trichomoniasis include:  Penile discharge that may be foamy or contain pus.  Pain in the penis. This may happen only when urinating.  Itching or irritation inside the penis.  Burning after urination or ejaculation. How is this diagnosed? In women, this condition may be found during a routine Pap test or physical exam. It may be found in men during a routine physical exam. Your health care provider may do tests to help diagnose this infection, such as:  Urine tests (men and women).  The following in women: ? Testing the pH of the vagina. ? A vaginal swab test that checks for the Trichomonas vaginalis parasite. ? Testing vaginal  secretions. Your health care provider may test you for other STIs, including HIV (human immunodeficiency virus). How is this treated? This condition is treated with medicine taken by mouth (orally), such as metronidazole or tinidazole, to fight the infection. Your sexual partner(s) also need to be tested and treated.  If you are a woman and you plan to become pregnant or think you may be pregnant, tell your health care provider right away. Some medicines that are used to treat the infection should not be taken during pregnancy. Your health care provider may recommend over-the-counter medicines or creams to help relieve itching or irritation. You may be tested for infection again 3 months after treatment. Follow these instructions at home:  Take and use over-the-counter and prescription medicines, including creams, only as told by your health care provider.  Take your antibiotic medicine as told by your health care provider. Do not stop taking the antibiotic even if you start to feel better.  Do not have sex until 7-10 days after you finish your medicine, or until your health care provider approves. Ask your health care provider when you may start to have sex again.  (Women) Do not douche or wear tampons while you have the infection.  Discuss your infection with your sexual partner(s). Make sure that your partner gets tested and treated, if necessary.  Keep all follow-up visits as told by your health care provider. This is important. How is this prevented?   Use condoms every time you have sex. Using condoms correctly and consistently can help protect against STIs.  Avoid having multiple sexual partners.  Talk with your sexual partner about any  symptoms that either of you may have, as well as any history of STIs.  Get tested for STIs and STDs (sexually transmitted diseases) before you have sex. Ask your partner to do the same.  Do not have sexual contact if you have symptoms of  trichomoniasis or another STI. Contact a health care provider if:  You still have symptoms after you finish your medicine.  You develop pain in your abdomen.  You have pain when you urinate.  You have bleeding after sex.  You develop a rash.  You feel nauseous or you vomit.  You plan to become pregnant or think you may be pregnant. Summary  Trichomoniasis is an STI (sexually transmitted infection) that can affect both women and men.  This condition often has no symptoms (is asymptomatic), especially in men.  Without treatment, this condition can last for months or years.  You should not have sex until 7-10 days after you finish your medicine, or until your health care provider approves. Ask your health care provider when you may start to have sex again.  Discuss your infection with your sexual partner(s). Make sure that your partner gets tested and treated, if necessary. This information is not intended to replace advice given to you by your health care provider. Make sure you discuss any questions you have with your health care provider. Document Released: 05/05/2001 Document Revised: 08/23/2018 Document Reviewed: 08/23/2018 Elsevier Patient Education  [place].   Abdominal Pain During Pregnancy  Belly (abdominal) pain is common during pregnancy. There are many possible causes. Most of the time, it is not a serious problem. Other times, it can be a sign that something is wrong with the pregnancy. Always tell your doctor if you have belly pain. Follow these instructions at home:  Do not have sex or put anything in your vagina until your pain goes away completely.  Get plenty of rest until your pain gets better.  Drink enough fluid to keep your pee (urine) pale yellow.  Take over-the-counter and prescription medicines only as told by your doctor.  Keep all follow-up visits as told by your doctor. This is important. Contact a doctor if:  Your pain continues or  gets worse after resting.  You have lower belly pain that: ? Comes and goes at regular times. ? Spreads to your back. ? Feels like menstrual cramps.  You have pain or burning when you pee (urinate). Get help right away if:  You have a fever or chills.  You have vaginal bleeding.  You are leaking fluid from your vagina.  You are passing tissue from your vagina.  You throw up (vomit) for more than 24 hours.  You have watery poop (diarrhea) for more than 24 hours.  Your baby is moving less than usual.  You feel very weak or faint.  You have shortness of breath.  You have very bad pain in your upper belly. Summary  Belly (abdominal) pain is common during pregnancy. There are many possible causes.  If you have belly pain during pregnancy, tell your doctor right away.  Keep all follow-up visits as told by your doctor. This is important. This information is not intended to replace advice given to you by your health care provider. Make sure you discuss any questions you have with your health care provider. Document Released: 10/28/2009 Document Revised: 02/27/2019 Document Reviewed: 02/11/2017 Elsevier Patient Education  2020 [employer].
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
## 2019-06-09 NOTE — ED Triage Notes (Signed)
Pt reports she was stung by a bee x 1 hour ago, attempted to use epi pen but states it did not work, pt c/o throat and tongue swelling

## 2019-06-09 NOTE — ED Provider Notes (Signed)
[facility] EMERGENCY DEPARTMENT Provider Note   CSN: [id number] Arrival date & time: 06/09/19  0153     History   Chief Complaint Chief Complaint  Patient presents with  . Allergic Reaction    HPI Erin Krueger is a 23 y.o. female.     The history is provided by the patient.  Allergic Reaction Presenting symptoms: difficulty breathing, difficulty swallowing, rash and swelling   Severity:  Severe Duration:  1 hour Context: insect bite/sting   Relieved by:  Nothing Worsened by:  Nothing Ineffective treatments:  Epinephrine Patient with history of asthma, ADHD, seizures presents with allergic reaction.  She reports she was stung by a bee 1 hour ago.  She then began to have a rash, throat and tongue swelling.  She also reports difficulty breathing.  She gave herself an EpiPen without any improvement. No vomiting, no syncope.  She reports she is approximately [redacted] weeks pregnant with recent OB/GYN evaluation  Past Medical History:  Diagnosis Date  . ADHD (attention deficit hyperactivity disorder)   . Allergy   . Anxiety   . Asthma   . Eating disorder   . Headache(784.0)   . Kidney stone    kidney stones  . Migraines   . ODD (oppositional defiant disorder)   . PID (acute pelvic inflammatory disease) 01/29/2016  . PTSD (post-traumatic stress disorder)   . Seizures [facility](HCC)    diagnosed age 27    Patient Active Problem List   Diagnosis Date Noted  . History of seizures 05/24/2019  . Encounter to determine fetal viability of pregnancy 05/24/2019  . Positive pregnancy test 05/24/2019  . History of medication noncompliance   . Status epilepticus (HCC) 11/29/2018  . Asthma 11/29/2018  . Seizures (HCC) 08/28/2018  . Third trimester pregnancy   . Persistent UTI (urinary tract infection) during pregnancy 04/08/2018  . Marijuana use 04/07/2018  . Supervision of normal pregnancy 04/05/2018  . Smoker 04/05/2018  . Death of child 04/05/2018  . 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  Breakthrough seizure (HCC) 01/07/2016  . Seizure (HCC) 01/07/2016  . 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 PTSD (post-traumatic stress disorder) 05/12/2013  . 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 Polysubstance abuse (HCC) 05/12/2013    Past Surgical History:  Procedure Laterality Date  . right knee surgery    . TONSILLECTOMY AND ADENOIDECTOMY       OB History    Gravida  4   Para  2   Term  2   Preterm      AB  1   Living  1     SAB  1   TAB      Ectopic      Multiple  0   Live Births  2            Home Medications    Prior to Admission medications   Medication Sig Start Date End Date Taking? Authorizing Provider  albuterol (PROVENTIL HFA;VENTOLIN HFA) 108 (90 Base) MCG/ACT inhaler Inhale 2 puffs into the lungs every 6 (six) hours as needed for wheezing or shortness of breath. 04/05/18   Cheral MarkerBooker, Kimberly R, CNM  EPINEPHrine 0.3 mg/0.3 mL IJ SOAJ injection Inject 0.3 mLs (0.3 mg total) into the muscle as needed. 05/23/19   Adline PotterGriffin, Jennifer A, NP  folic acid (FOLVITE) 1 MG tablet Take 4 mg daily 06/01/19   Cyril MourningGriffin, Jennifer A, NP  levETIRAcetam (KEPPRA) 1000 MG tablet Take 1 tablet (1,000 mg total) by mouth  2 (two) times daily for 30 days. Patient taking differently: Take 500 mg by mouth 2 (two) times daily.  04/26/19 05/26/19  Leretha DykesHernandez, Ana P, PA-C  Pediatric Multiple Vit-C-FA (FLINSTONES GUMMIES OMEGA-3 DHA PO) Take by mouth. Takes 2 daily    [provider]    Family History Family History  Problem Relation Age of Onset  . Diabetes Mother   . Diabetes Sister   . Breast cancer Maternal Grandmother   . Diabetes Maternal Grandmother   . Glaucoma Maternal Grandmother   . Other Son        born with hole in heart  . Other Daughter        born with hole in heart    Social History Social History   Tobacco Use  . Smoking status: Current Every Day Smoker    Packs/day: 0.50    Types:  Cigarettes, E-cigarettes  . Smokeless tobacco: Never Used  . Tobacco comment: Not currently since 02/13  Substance Use Topics  . Alcohol use: Not Currently  . Drug use: Not Currently    Types: Marijuana    Comment: Not since 02/13     Allergies   Bee venom, Cashew nut oil, Penicillins, Lodine [etodolac], Prednisone, Cocoa butter, and Latex   Review of Systems Review of Systems  HENT: Positive for trouble swallowing.   Respiratory: Positive for shortness of breath.   Gastrointestinal: Negative for vomiting.  Skin: Positive for rash.  Neurological: Negative for syncope.  All other systems reviewed and are negative.    Physical Exam Updated Vital Signs BP 108/79   Pulse 92   Temp 98.9 F (37.2 C) (Oral)   Resp (!) 23   Ht 1.702 m (5\' 7" )   Wt 81.6 kg   LMP 04/12/2019 (Approximate)   SpO2 99%   BMI 28.19 kg/m   Physical Exam CONSTITUTIONAL: Well developed/well nourished, anxious HEAD: Normocephalic/atraumatic EYES: EOMI/PERRL ENMT: Mucous membranes moist, no drooling, no stridor, no angioedema noted NECK: supple no meningeal signs SPINE/BACK:entire spine nontender CV: S1/S2 noted, no murmurs/rubs/gallops noted LUNGS: Scattered wheezing bilaterally ABDOMEN: soft, nontender NEURO: Pt is awake/alert/appropriate, moves all extremitiesx4.  No facial droop.   EXTREMITIES: pulses normal/equal, full ROM SKIN: warm, color normal, no hives PSYCH: Anxious   ED Treatments / Results  Labs (all labs ordered are listed, but only abnormal results are displayed) Labs Reviewed - No data to display  EKG None  Radiology [place] Ob Comp Less 14 Wks  Result Date: 06/08/2019 DATING AND VIABILITY SONOGRAM Erin Krueger is a 23 y.o. year old 54P2011 with LMP 04/12/2019 which would correlate to  [redacted] weeks gestation.  She has regular menstrual cycles.   She is here today for a confirmatory initial sonogram. GESTATION: SINGLETON   FETAL ACTIVITY:          Heart rate         118 bpm         CERVIX: Appears closed ADNEXA: The ovaries are normal. GESTATIONAL AGE AND  BIOMETRICS: Gestational criteria: Estimated Date of Delivery: 01/17/2020 by LMP now at 8 wks Previous Scans:0    CROWN RUMP LENGTH           6.02 mm         6+3 weeks  AVERAGE EGA(BY THIS SCAN):  6+3 weeks WORKING EDD( early ultrasound ):  01/28/2020  TECHNICIAN COMMENTS: [place] 6+3 wks,single IUP w/ys,positive fht 118 bpm,normal ovaries bilat,crl 6.02 mm A copy of this report including all images has been saved and backed up to a second source for retrieval if needed. All measures and details of the anatomical scan, placentation, fluid volume and pelvic anatomy are contained in that report. Amber Heide Guile 06/07/2019 5:00 PM Clinical Impression and recommendations: I have reviewed the sonogram results above, combined with the patient's current clinical course, below are my impressions and any appropriate recommendations for management based on the sonographic findings. Viable early IUP [id number] Estimated Date of Delivery: 01/28/20 by today's sonogram Normal general sonographic findings Recommend routine care unless otherwise clinically indicated Florian Buff 06/08/2019 11:06 AM   Procedures Procedures (including critical care time)  Medications Ordered in ED Medications  EPINEPHrine (EPI-PEN) injection 0.3 mg (0.3 mg Intramuscular Given 06/09/19 0203)  diphenhydrAMINE (BENADRYL) injection 25 mg (25 mg Intravenous Given 06/09/19 0203)  methylPREDNISolone sodium succinate (SOLU-MEDROL) 125 mg/2 mL injection 125 mg (125 mg Intravenous Given 06/09/19 0203)  albuterol (VENTOLIN HFA) 108 (90 Base) MCG/ACT inhaler 2 puff (2 puffs Inhalation Given 06/09/19 0203)     Initial Impression / Assessment and Plan / ED Course  I have reviewed the triage vital signs and the nursing notes.      2:36 AM Patient presents for potential allergic reaction after insect sting Will give  repeat EpiPen/Benadryl/Solu-Medrol.  Also given albuterol.  Patient known pregnant, approximately 6 weeks by recent ultrasound However given the severity of allergic reaction, will proceed with all medications 3:00 AM Patient resting comfortably in no acute distress.  We will continue to monitor 5:03 AM Patient feeling back to baseline.  She is requesting discharge.  Will defer prednisone as she has an allergy.  EpiPen has been refilled for patient  Final Clinical Impressions(s) / ED Diagnoses   Final diagnoses:  Allergic reaction, initial encounter    ED Discharge Orders         Ordered    EPINEPHrine (EPIPEN 2-PAK) 0.3 mg/0.3 mL IJ SOAJ injection  As needed     06/09/19 Kathee Delton, MD 06/09/19 [phone]

## 2019-06-12 LAB — CULTURE, OB URINE: Culture: 30000 — AB

## 2019-06-13 ENCOUNTER — Telehealth: Payer: Self-pay

## 2019-06-13 DIAGNOSIS — O2341 Unspecified infection of urinary tract in pregnancy, first trimester: Secondary | ICD-10-CM

## 2019-06-13 MED ORDER — NITROFURANTOIN MONOHYD MACRO 100 MG PO CAPS: 100.0000 mg | ORAL_CAPSULE | Freq: Two times a day (BID) | ORAL | 0 refills | Status: DC

## 2019-06-13 NOTE — Telephone Encounter (Signed)
Attempted to contact patient regarding urine culture results x 2.  Message left, at 1720, regarding need for follow up related to results and patient informed message to be sent via mychart.  Rx for Macrobid 100mg  BID x 7 days sent to pharmacy on file.  Maryann Conners MSN, CNM 06/13/2019 5:21 PM

## 2019-06-14 LAB — GC/CHLAMYDIA PROBE AMP (~~LOC~~) NOT AT ARMC
Chlamydia: NEGATIVE
Neisseria Gonorrhea: NEGATIVE

## 2019-06-18 ENCOUNTER — Other Ambulatory Visit: Payer: Self-pay

## 2019-06-18 ENCOUNTER — Encounter (HOSPITAL_COMMUNITY): Payer: Self-pay | Admitting: Emergency Medicine

## 2019-06-18 ENCOUNTER — Emergency Department (HOSPITAL_COMMUNITY)
Admission: EM | Admit: 2019-06-18 | Discharge: 2019-06-19 | Disposition: A | Discharge status: Home / Self Care | Payer: Medicaid Other | Attending: Emergency Medicine | Admitting: Emergency Medicine

## 2019-06-18 DIAGNOSIS — R569 Unspecified convulsions: Secondary | ICD-10-CM | POA: Insufficient documentation

## 2019-06-18 DIAGNOSIS — O9989 Other specified diseases and conditions complicating pregnancy, childbirth and the puerperium: Secondary | ICD-10-CM | POA: Insufficient documentation

## 2019-06-18 DIAGNOSIS — F17290 Nicotine dependence, other tobacco product, uncomplicated: Secondary | ICD-10-CM | POA: Insufficient documentation

## 2019-06-18 DIAGNOSIS — Z3A08 8 weeks gestation of pregnancy: Secondary | ICD-10-CM | POA: Diagnosis not present

## 2019-06-18 DIAGNOSIS — F17210 Nicotine dependence, cigarettes, uncomplicated: Secondary | ICD-10-CM | POA: Diagnosis not present

## 2019-06-18 DIAGNOSIS — O99331 Smoking (tobacco) complicating pregnancy, first trimester: Secondary | ICD-10-CM | POA: Insufficient documentation

## 2019-06-18 DIAGNOSIS — J45909 Unspecified asthma, uncomplicated: Secondary | ICD-10-CM | POA: Diagnosis not present

## 2019-06-18 DIAGNOSIS — Z91040 Latex allergy status: Secondary | ICD-10-CM | POA: Insufficient documentation

## 2019-06-18 DIAGNOSIS — Z79899 Other long term (current) drug therapy: Secondary | ICD-10-CM | POA: Diagnosis not present

## 2019-06-18 LAB — COMPREHENSIVE METABOLIC PANEL
ALT: 12 U/L (ref 0–44)
AST: 14 U/L — ABNORMAL LOW (ref 15–41)
Albumin: 3.8 g/dL (ref 3.5–5.0)
Alkaline Phosphatase: 60 U/L (ref 38–126)
Anion gap: 10 (ref 5–15)
BUN: 9 mg/dL (ref 6–20)
CO2: 22 mmol/L (ref 22–32)
Calcium: 9.5 mg/dL (ref 8.9–10.3)
Chloride: 106 mmol/L (ref 98–111)
Creatinine, Ser: 0.66 mg/dL (ref 0.44–1.00)
GFR calc Af Amer: >60 mL/min (ref >60)
GFR calc non Af Amer: >60 mL/min (ref >60)
Glucose, Bld: 93 mg/dL (ref 70–99)
Potassium: 3.7 mmol/L (ref 3.5–5.1)
Sodium: 138 mmol/L (ref 135–145)
Total Bilirubin: 0.2 mg/dL — ABNORMAL LOW (ref 0.3–1.2)
Total Protein: 6.9 g/dL (ref 6.5–8.1)

## 2019-06-18 LAB — CBC WITH DIFFERENTIAL/PLATELET
Abs Immature Granulocytes: 0.03 10*3/uL (ref 0.00–0.07)
Basophils Absolute: 0 10*3/uL (ref 0.0–0.1)
Basophils Relative: 0 %
Eosinophils Absolute: 0.2 10*3/uL (ref 0.0–0.5)
Eosinophils Relative: 2 %
HCT: 39.7 % (ref 36.0–46.0)
Hemoglobin: 13.4 g/dL (ref 12.0–15.0)
Immature Granulocytes: 0 %
Lymphocytes Relative: 27 %
Lymphs Abs: 3 10*3/uL (ref 0.7–4.0)
MCH: 30.2 pg (ref 26.0–34.0)
MCHC: 33.8 g/dL (ref 30.0–36.0)
MCV: 89.6 fL (ref 80.0–100.0)
Monocytes Absolute: 0.8 10*3/uL (ref 0.1–1.0)
Monocytes Relative: 7 %
Neutro Abs: 7 10*3/uL (ref 1.7–7.7)
Neutrophils Relative %: 64 %
Platelets: 222 10*3/uL (ref 150–400)
RBC: 4.43 MIL/uL (ref 3.87–5.11)
RDW: 12.4 % (ref 11.5–15.5)
WBC: 11 10*3/uL — ABNORMAL HIGH (ref 4.0–10.5)
nRBC: 0 % (ref 0.0–0.2)

## 2019-06-18 LAB — ETHANOL: Alcohol, Ethyl (B): <10 mg/dL (ref <10)

## 2019-06-18 LAB — TROPONIN I (HIGH SENSITIVITY): Troponin I (High Sensitivity): 3 ng/L (ref <18)

## 2019-06-18 MED ORDER — LEVETIRACETAM 500 MG PO TABS
500.0000 mg | ORAL_TABLET | Freq: Once | ORAL | Status: AC
  Administered 2019-06-18: 500 mg via ORAL
  Filled 2019-06-18: qty 1

## 2019-06-18 MED ORDER — SODIUM CHLORIDE 0.9 % IV BOLUS
1000.0000 mL | Freq: Once | INTRAVENOUS | Status: AC
  Administered 2019-06-18: 1000 mL via INTRAVENOUS

## 2019-06-18 NOTE — Progress Notes (Signed)
Faculty Practice OB/GYN Attending Note  Called to evaluate patient noticed to have seizures in her car at the entrance to [facility]. On my arrival, patient was post-ictal and being moved from car to stretcher.  She was breathing on her own.  I was informed that she is about [redacted] weeks pregnant.  I escorted patient during her move to [facility] ER. She had another seizure on the way that lasted about one minute, but resolved without complication.  In the ED, she was still post-ictal. Using a bedside ultrasound, I was able to visualize a viable IUP at around [redacted] weeks gestation, +FHR in the 150s.  Patient has no vaginal bleeding or other obstetric concern at this point. She is obstetrically cleared; will defer to the ED team for management of her seizure disorder.  I am available for any obstetric concerns or questions about medication use in pregnancy. Patient will follow up with my partners at [facility] for her prenatal care as scheduled.   Verita Schneiders, MD, [place] for [employer], [place]

## 2019-06-18 NOTE — ED Provider Notes (Signed)
[facility] EMERGENCY DEPARTMENT Provider Note   CSN: [id number] Arrival date & time: 06/18/19  2210    History   Chief Complaint Chief Complaint  Patient presents with  . Seizures    HPI Erin Krueger is a 23 y.o. female hx of PTSD, both seizure and pseudoseizure, about [redacted] weeks pregnant here presenting with seizure.  Patient was apparently with family and apparently there was a stressful situation.  Patient was witnessed by her boyfriend having a seizure-like activity.  He thought that she stopped breathing so started chest compression on her and did to chest compressions and she immediately woke up.  She went to [facility] and apparently had an episode of incontinence and perhaps another seizure.  She had a bedside ultrasound done by OB there and she was noted to have a live IUP.  She came to the ED about 2 weeks ago and had a 6-week live IUP at that time. There was no reported vaginal bleeding or discharge or abdominal pain.  Patient does take Keppra at baseline and is on 1000 mg in the morning as well as 500 mg at night.  Per the boyfriend, patient did take her Keppra this morning.     The history is provided by the patient.    Past Medical History:  Diagnosis Date  . ADHD (attention deficit hyperactivity disorder)   . Allergy   . Anxiety   . Asthma   . Eating disorder   . Headache(784.0)   . Kidney stone    kidney stones  . Migraines   . ODD (oppositional defiant disorder)   . PID (acute pelvic inflammatory disease) 01/29/2016  . PTSD (post-traumatic stress disorder)   . Seizures [facility])    diagnosed age 76    Patient Active Problem List   Diagnosis Date Noted  . Trichomonal vaginitis during pregnancy 06/09/2019  . History of seizures 05/24/2019  . Encounter to determine fetal viability of pregnancy 05/24/2019  . Positive pregnancy test 05/24/2019  . History of medication noncompliance   . Status epilepticus ([place]) 11/29/2018  . Asthma  11/29/2018  . Seizures ([place]) 08/28/2018  . Third trimester pregnancy   . Persistent UTI (urinary tract infection) during pregnancy 04/08/2018  . Marijuana use 04/07/2018  . Supervision of normal pregnancy May 04, 2018  . Smoker May 04, 2018  . Death of child 05/04/2018  . 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 Breakthrough seizure ([place]) 01/07/2016  . Seizure ([place]) 01/07/2016  . 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 PTSD (post-traumatic stress disorder) 05/12/2013  . 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 Polysubstance abuse ([place]) 05/12/2013    Past Surgical History:  Procedure Laterality Date  . right knee surgery    . TONSILLECTOMY AND ADENOIDECTOMY       OB History    Gravida  4   Para  2   Term  2   Preterm      AB  1   Living  1     SAB  1   TAB      Ectopic      Multiple  0   Live Births  2            Home Medications    Prior to Admission medications   Medication Sig Start Date End Date Taking? Authorizing Provider  albuterol (PROVENTIL HFA;VENTOLIN HFA) 108 (90 Base) MCG/ACT inhaler Inhale 2 puffs into the lungs every 6 (six) hours as needed for wheezing or shortness of breath.  04/05/18   Cheral MarkerBooker, Kimberly R, CNM  EPINEPHrine (EPIPEN 2-PAK) 0.3 mg/0.3 mL IJ SOAJ injection Inject 0.3 mLs (0.3 mg total) into the muscle as needed for anaphylaxis. 06/09/19   Zadie RhineWickline, Donald, MD  folic acid (FOLVITE) 1 MG tablet Take 4 mg daily 06/01/19   Cyril MourningGriffin, Jennifer A, NP  levETIRAcetam (KEPPRA) 1000 MG tablet Take 1 tablet (1,000 mg total) by mouth 2 (two) times daily for 30 days. Patient taking differently: Take 500 mg by mouth 2 (two) times daily.  04/26/19 05/26/19  Carlyle BasquesHernandez, Ana P, PA-C  nitrofurantoin, macrocrystal-monohydrate, (MACROBID) 100 MG capsule Take 1 capsule (100 mg total) by mouth 2 (two) times daily. 06/13/19   Gerrit HeckEmly, Jessica, CNM  Pediatric Multiple Vit-C-FA (FLINSTONES GUMMIES OMEGA-3 DHA PO) Take by mouth.  Takes 2 daily    [provider]    Family History Family History  Problem Relation Age of Onset  . Diabetes Mother   . Diabetes Sister   . Breast cancer Maternal Grandmother   . Diabetes Maternal Grandmother   . Glaucoma Maternal Grandmother   . Other Son        born with hole in heart  . Other Daughter        born with hole in heart    Social History Social History   Tobacco Use  . Smoking status: Current Every Day Smoker    Packs/day: 0.50    Types: Cigarettes, E-cigarettes  . Smokeless tobacco: Never Used  . Tobacco comment: Not currently since 02/13  Substance Use Topics  . Alcohol use: Not Currently  . Drug use: Not Currently    Types: Marijuana    Comment: Not since 02/13     Allergies   Bee venom, Cashew nut oil, Penicillins, Lodine [etodolac], Prednisone, Cocoa butter, and Latex   Review of Systems Review of Systems  Neurological: Positive for seizures.  All other systems reviewed and are negative.    Physical Exam Updated Vital Signs BP 102/88   Pulse 80   Temp 99.3 F (37.4 C) (Oral)   Resp 19   Ht 5\' 7"  (1.702 m)   Wt 81.6 kg   LMP 04/12/2019 (Approximate)   SpO2 98%   BMI 28.19 kg/m   Physical Exam Vitals signs and nursing note reviewed.  Constitutional:      Comments: Tired, not speaking much. No eye deviation   HENT:     Head: Normocephalic.     Nose: Nose normal.     Mouth/Throat:     Mouth: Mucous membranes are moist.  Eyes:     Extraocular Movements: Extraocular movements intact.     Pupils: Pupils are equal, round, and reactive to light.  Neck:     Musculoskeletal: Normal range of motion.  Cardiovascular:     Rate and Rhythm: Normal rate.     Pulses: Normal pulses.     Heart sounds: Normal heart sounds.  Pulmonary:     Effort: Pulmonary effort is normal.     Breath sounds: Normal breath sounds.  Abdominal:     General: Abdomen is flat.     Palpations: Abdomen is soft.  Musculoskeletal: Normal range of  motion.  Skin:    General: Skin is warm.     Capillary Refill: Capillary refill takes less than 2 seconds.  Neurological:     Mental Status: She is alert.     Comments: Tired. Moving all extremities. No eye deviation. Refuses to follow command  Psychiatric:     Comments: Unable  ED Treatments / Results  Labs (all labs ordered are listed, but only abnormal results are displayed) Labs Reviewed  CBC WITH DIFFERENTIAL/PLATELET  COMPREHENSIVE METABOLIC PANEL  ETHANOL  RAPID URINE DRUG SCREEN, HOSP PERFORMED  HCG, QUANTITATIVE, PREGNANCY  TROPONIN I (HIGH SENSITIVITY)    EKG None  Radiology No results found.  Procedures Procedures (including critical care time)  Medications Ordered in ED Medications  sodium chloride 0.9 % bolus 1,000 mL (1,000 mLs Intravenous New Bag/Given 06/18/19 2254)  levETIRAcetam (KEPPRA) tablet 500 mg (500 mg Oral Given 06/18/19 2254)     Initial Impression / Assessment and Plan / ED Course  I have reviewed the triage vital signs and the nursing notes.  Pertinent labs & imaging results that were available during my care of the patient were reviewed by me and considered in my medical decision making (see chart for details).       Sidda [name] is a 23 y.o. female here with seizure like activity. She is [redacted] weeks pregnant and went to OB already and apparently had normal FHR today. She has no vaginal bleeding.  She has a history of both seizures and pseudoseizures. I talked to Dr. Amada JupiterKirkpatrick, who will come and see patient.   11:04 PM Dr. Amada JupiterKirkpatrick saw patient.  Patient apparently is talking and awake and alert.  He felt that patient likely had a pseudoseizure.  Since patient did not get her evening dose of Keppra, will give her home dose of Keppra orally and give extra 250 mg tonight. We will check some labs and if stable, anticipate discharge home. Signed out to Dr. Eudelia Bunchardama to reassess patient.  She has neurologist that she can follow up with.     Final Clinical Impressions(s) / ED Diagnoses   Final diagnoses:  None    ED Discharge Orders    None       Charlynne PanderYao, Purvi Ruehl Hsienta, MD 06/18/19 2309

## 2019-06-18 NOTE — ED Triage Notes (Signed)
Pt brought to ED from [facility] ref. Seizure.  Pt had a seizure at home and again after arriving at [facility].  On arrival to ED pt postictal and was incontinent of urine.  Pt is approx 8 weeks preg.  IUP was confirmed by [facility] doctor via ultrasound after arriving in ED.  Pt st's she has taken one dose of her Keppra today

## 2019-06-18 NOTE — Consult Note (Signed)
Neurology Consultation Reason for Consult: Seizure Referring Physician: Darl Householder, D  CC: Seizure  History is obtained from: Patient, chart  HPI: Erin Krueger is a 23 y.o. female with a history of seizures since age 44, PTSD, oppositional defiant disorder, ADHD, who presents with episode concerning for seizure tonight.  She was with her boyfriend in the car when she states that she suddenly does not remember anything more than that.  She was apparently on 1 g twice daily of Keppra prior to pregnancy, but has since been decreased to 750 twice daily by her epileptologist.  She had a breakthrough seizure 2 weeks ago, and at that time contacted her a blood tele-just who told her to continue taking her current dose.  Tonight with the episode, she was incontinent of urine.   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  She had a single abnormal EEG in 2017 which I have not been able to review personally, but which reportedly showed a left temporal sharp wave.  ROS: A 14 point ROS was performed and is negative except as noted in the HPI.   Past Medical History:  Diagnosis Date  . ADHD (attention deficit hyperactivity disorder)   . Allergy   . Anxiety   . Asthma   . Eating disorder   . Headache(784.0)   . Kidney stone    kidney stones  . Migraines   . ODD (oppositional defiant disorder)   . PID (acute pelvic inflammatory disease) 01/29/2016  . PTSD (post-traumatic stress disorder)   . Seizures [facility])    diagnosed age 14     Family History  Problem Relation Age of Onset  . Diabetes Mother   . Diabetes Sister   . Breast cancer Maternal Grandmother   . Diabetes Maternal Grandmother   . Glaucoma Maternal Grandmother   . Other Son        born with hole in heart  . Other Daughter        born with hole in heart     Social History:  reports that she has been smoking cigarettes and e-cigarettes. She has  been smoking about 0.50 packs per day. She has never used smokeless tobacco. She reports previous alcohol use. She reports previous drug use. Drug: Marijuana.   Exam: Current vital signs: BP 112/85 (BP Location: Left Arm)   Pulse 83   Temp 99.3 F (37.4 C) (Oral)   Resp 17   Ht 5\' 7"  (1.702 m)   Wt 81.6 kg   LMP 04/12/2019 (Approximate)   SpO2 98%   BMI 28.19 kg/m  Vital signs in last 24 hours: Temp:  [99.3 F (37.4 C)] 99.3 F (37.4 C) (07/26 2234) Pulse Rate:  [83] 83 (07/26 2234) Resp:  [17] 17 (07/26 2234) BP: (112)/(85) 112/85 (07/26 2234) SpO2:  [98 %] 98 % (07/26 2234) Weight:  [81.6 kg] 81.6 kg (07/26 2243)   Physical Exam  Constitutional: Appears well-developed and well-nourished.  Psych: Affect appropriate to situation Eyes: No scleral injection HENT: No OP obstrucion Head: Normocephalic.  Cardiovascular: Normal rate and regular rhythm.  Respiratory: Effort normal, non-labored breathing GI: Soft.  No distension. There is no tenderness.  Skin: WDI  Neuro: Mental Status: Patient is awake, alert, oriented to person, place, month, year, and situation. Patient is able to give a clear and coherent history. No signs of aphasia or neglect Cranial Nerves: II: Visual Fields are full. Pupils are equal, round,  and reactive to light.   III,IV, VI: EOMI without ptosis or diploplia.  V: Facial sensation is symmetric to temperature VII: Facial movement is symmetric.  VIII: hearing is intact to voice X: Uvula elevates symmetrically XI: Shoulder shrug is symmetric. XII: tongue is midline without atrophy or fasciculations.  Motor: Tone is normal. Bulk is normal. 5/5 strength was present in all four extremities.  Sensory: Sensation is symmetric to light touch and temperature in the arms and legs. Cerebellar: FNF and HKS are intact bilaterally   I have reviewed labs in epic and the results pertinent to this consultation are: CBC mildly elevated WBC  I have reviewed  the images obtained: CT head from 2017-normal  Impression: 23 year old female with a history of seizures since age 89 who presents with breakthrough episode concerning for seizure.  Given that she did recently decrease her dose of Keppra, I think increasing it back to 1 g for tonight and contacting her blood telemetry just to see if she needs to continue at that dose tomorrow would be a reasonable option.  Recommendations: 1) Keppra 1 g x 1 2) follow-up with outpatient neurology.   Ritta SlotMcNeill Anicka Stuckert, MD [facility] [phone]  If 7pm- 7am, please page neurology on call as listed in [facility]ION.

## 2019-06-18 NOTE — ED Notes (Signed)
Seizure pads in place on stretcher. 

## 2019-06-19 LAB — HCG, QUANTITATIVE, PREGNANCY: hCG, Beta Chain, Quant, S: 189993 m[IU]/mL — ABNORMAL HIGH (ref <5)

## 2019-06-19 NOTE — ED Notes (Signed)
Pt moved off the floor by another person I lost my last vitals

## 2019-06-19 NOTE — ED Notes (Signed)
The pt reports that she was told that she was going home no papers yet  Given scrubs and sicks

## 2019-06-19 NOTE — ED Provider Notes (Signed)
I assumed care of this patient from Dr. Darl Householder at 2300.  Please see their note for further details of Hx, PE.  Briefly patient is a 23 y.o. female who presented with seizure activity. 8 wk preg. H/o seizure/pseudoseizure. Not in status. No meds required. Might have missed dose of Keppra. Loaded here. Pending labs.  Labs reassuring.  No recurrent seizure.  The patient is safe for discharge with strict return precautions.   The patient appears reasonably screened and/or stabilized for discharge and I doubt any other medical condition or other [facility] requiring further screening, evaluation, or treatment in the ED at this time prior to discharge.  Disposition: Discharge  Condition: Good  I have discussed the results, Dx and Tx plan with the patient who expressed understanding and agree(s) with the plan. Discharge instructions discussed at great length. The patient was given strict return precautions who verbalized understanding of the instructions. No further questions at time of discharge.    ED Discharge Orders    None     Follow Up: [facility], [address] [place] [address] [phone]         Fatima Blank, [place] 06/19/19 [phone]

## 2019-06-28 ENCOUNTER — Encounter (HOSPITAL_COMMUNITY): Payer: Self-pay | Admitting: Emergency Medicine

## 2019-06-28 ENCOUNTER — Other Ambulatory Visit: Payer: Self-pay

## 2019-06-28 ENCOUNTER — Emergency Department (HOSPITAL_COMMUNITY)
Admission: EM | Admit: 2019-06-28 | Discharge: 2019-06-28 | Discharge status: Against Medical Advice | Payer: Medicaid Other | Attending: Emergency Medicine | Admitting: Emergency Medicine

## 2019-06-28 DIAGNOSIS — Z5321 Procedure and treatment not carried out due to patient leaving prior to being seen by health care provider: Secondary | ICD-10-CM | POA: Diagnosis not present

## 2019-06-28 DIAGNOSIS — R51 Headache: Secondary | ICD-10-CM | POA: Diagnosis present

## 2019-06-28 NOTE — ED Notes (Signed)
Pt stepped out to smoke.  Has not returned yet.

## 2019-06-28 NOTE — ED Notes (Signed)
Pt. Called x2, no ans.  Not seen in waiting room either

## 2019-06-28 NOTE — ED Notes (Signed)
No answer x3 for vitals.  Not seen in [facility]

## 2019-06-28 NOTE — ED Triage Notes (Signed)
Patient here with migraine.  She has had a headache for three days.  She states she is 9weeks and 3days pregnant.  Patient states she vomited this morning.  No sensitivity to noise or light.

## 2019-06-29 ENCOUNTER — Telehealth (HOSPITAL_COMMUNITY): Payer: Self-pay

## 2019-07-09 ENCOUNTER — Encounter (HOSPITAL_COMMUNITY): Payer: Self-pay | Admitting: Emergency Medicine

## 2019-07-09 ENCOUNTER — Emergency Department (HOSPITAL_COMMUNITY)
Admission: EM | Admit: 2019-07-09 | Discharge: 2019-07-09 | Discharge status: Against Medical Advice | Payer: Medicaid Other | Attending: Emergency Medicine | Admitting: Emergency Medicine

## 2019-07-09 ENCOUNTER — Other Ambulatory Visit: Payer: Self-pay

## 2019-07-09 DIAGNOSIS — G43909 Migraine, unspecified, not intractable, without status migrainosus: Secondary | ICD-10-CM | POA: Insufficient documentation

## 2019-07-09 DIAGNOSIS — F17210 Nicotine dependence, cigarettes, uncomplicated: Secondary | ICD-10-CM | POA: Insufficient documentation

## 2019-07-09 DIAGNOSIS — J45909 Unspecified asthma, uncomplicated: Secondary | ICD-10-CM | POA: Diagnosis not present

## 2019-07-09 DIAGNOSIS — Z79899 Other long term (current) drug therapy: Secondary | ICD-10-CM | POA: Diagnosis not present

## 2019-07-09 DIAGNOSIS — Z3A11 11 weeks gestation of pregnancy: Secondary | ICD-10-CM | POA: Diagnosis not present

## 2019-07-09 DIAGNOSIS — R569 Unspecified convulsions: Secondary | ICD-10-CM | POA: Insufficient documentation

## 2019-07-09 DIAGNOSIS — O9989 Other specified diseases and conditions complicating pregnancy, childbirth and the puerperium: Secondary | ICD-10-CM | POA: Insufficient documentation

## 2019-07-09 MED ORDER — METOCLOPRAMIDE HCL 5 MG/ML IJ SOLN
10.0000 mg | Freq: Once | INTRAMUSCULAR | Status: AC
  Administered 2019-07-09: 20:00:00 10 mg via INTRAVENOUS
  Filled 2019-07-09: qty 2

## 2019-07-09 MED ORDER — DIPHENHYDRAMINE HCL 50 MG/ML IJ SOLN
25.0000 mg | Freq: Once | INTRAMUSCULAR | Status: AC
  Administered 2019-07-09: 20:00:00 25 mg via INTRAVENOUS
  Filled 2019-07-09: qty 1

## 2019-07-09 MED ORDER — SODIUM CHLORIDE 0.9 % IV BOLUS
1000.0000 mL | Freq: Once | INTRAVENOUS | Status: AC
  Administered 2019-07-09: 20:00:00 1000 mL via INTRAVENOUS

## 2019-07-09 NOTE — ED Notes (Signed)
Pt requesting to sign out AMA due to family emergency. PA Tammy notified. Pt ambulatory to waiting room in NAD.

## 2019-07-09 NOTE — ED Provider Notes (Signed)
[facility] EMERGENCY DEPARTMENT Provider Note   CSN: [id number] Arrival date & time: 07/09/19  1814     History   Chief Complaint Chief Complaint  Patient presents with  . Migraine    HPI [name] is a 23 y.o. female.     HPI    [name] is a 23 y.o. female G4P2 [redacted] weeks pregnant with history of migraine headaches, presents to the Emergency Department complaining of recurrent migraine headache.  She states that she woke this morning with a diffuse headache to the right side of her forehead that radiates to her temple and behind her eye.  She describes the pain as a throbbing sensation that is gradually worsened throughout the day.  She states she typically takes Excedrin Migraine for her headaches, but she was afraid to take it since she is currently pregnant.  She took Tylenol x2 today without relief.  She also endorses nausea and vomiting and has been unable to keep any food or fluids down today.  She states the headache is similar to her previous migraines.  She denies any pregnancy related symptoms, including abdominal pain, vaginal bleeding, dysuria.  She is followed by Dr[facility] and [facility].  Ultrasound confirmed single IUP on 06/07/2019 with EDD of 01/17/2020    Past Medical History:  Diagnosis Date  . ADHD (attention deficit hyperactivity disorder)   . Allergy   . Anxiety   . Asthma   . Eating disorder   . Headache(784.0)   . Kidney stone    kidney stones  . Migraines   . ODD (oppositional defiant disorder)   . PID (acute pelvic inflammatory disease) 01/29/2016  . PTSD (post-traumatic stress disorder)   . Seizures [facility](HCC)    diagnosed age 89    Patient Active Problem List   Diagnosis Date Noted  . Trichomonal vaginitis during pregnancy 06/09/2019  . History of seizures 05/24/2019  . Encounter to determine fetal viability of pregnancy 05/24/2019  . Positive pregnancy test 05/24/2019  . History of medication noncompliance   . Status epilepticus  (HCC) 11/29/2018  . Asthma 11/29/2018  . Seizures (HCC) 08/28/2018  . Third trimester pregnancy   . Persistent UTI (urinary tract infection) during pregnancy 04/08/2018  . Marijuana use 04/07/2018  . Supervision of normal pregnancy 04/05/2018  . Smoker 04/05/2018  . Death of child 04/05/2018  . 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 Breakthrough seizure (HCC) 01/07/2016  . Seizure (HCC) 01/07/2016  . 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 PTSD (post-traumatic stress disorder) 05/12/2013  . 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 Polysubstance abuse (HCC) 05/12/2013    Past Surgical History:  Procedure Laterality Date  . right knee surgery    . TONSILLECTOMY AND ADENOIDECTOMY       OB History    Gravida  4   Para  2   Term  2   Preterm      AB  1   Living  1     SAB  1   TAB      Ectopic      Multiple  0   Live Births  2            Home Medications    Prior to Admission medications   Medication Sig Start Date End Date Taking? Authorizing Provider  acetaminophen (TYLENOL) 500 MG tablet Take 500 mg by mouth every 6 (six) hours as needed for mild pain or moderate pain.   Yes [provider]  albuterol (PROVENTIL HFA;VENTOLIN HFA) 108 (90 Base) MCG/ACT inhaler Inhale 2 puffs into the lungs every 6 (six) hours as needed for wheezing or shortness of breath. 04/05/18  Yes Cheral MarkerBooker, Kimberly R, CNM  EPINEPHrine (EPIPEN 2-PAK) 0.3 mg/0.3 mL IJ SOAJ injection Inject 0.3 mLs (0.3 mg total) into the muscle as needed for anaphylaxis. 06/09/19  Yes Zadie RhineWickline, Donald, MD  ferrous sulfate 325 (65 FE) MG tablet Take 325 mg by mouth daily as needed (for iron defficiency).   Yes [provider]  levETIRAcetam (KEPPRA) 750 MG tablet Take 750 mg by mouth 2 (two) times daily.   Yes [provider]  Potassium 99 MG TABS Take 1 tablet by mouth daily as needed (for defficiency).   Yes [provider]  folic  acid (FOLVITE) 1 MG tablet Take 4 mg daily Patient not taking: Reported on 06/19/2019 06/01/19   Adline PotterGriffin, Jennifer A, NP  levETIRAcetam (KEPPRA) 1000 MG tablet Take 1 tablet (1,000 mg total) by mouth 2 (two) times daily for 30 days. Patient not taking: Reported on 07/09/2019 04/26/19 06/18/28  Carlyle BasquesHernandez, Ana P, PA-C  nitrofurantoin, macrocrystal-monohydrate, (MACROBID) 100 MG capsule Take 1 capsule (100 mg total) by mouth 2 (two) times daily. Patient not taking: Reported on 06/19/2019 06/13/19   Gerrit HeckEmly, Jessica, CNM    Family History Family History  Problem Relation Age of Onset  . Diabetes Mother   . Diabetes Sister   . Breast cancer Maternal Grandmother   . Diabetes Maternal Grandmother   . Glaucoma Maternal Grandmother   . Other Son        born with hole in heart  . Other Daughter        born with hole in heart    Social History Social History   Tobacco Use  . Smoking status: Current Every Day Smoker    Packs/day: 0.50    Types: Cigarettes, E-cigarettes  . Smokeless tobacco: Never Used  . Tobacco comment: Not currently since 02/13  Substance Use Topics  . Alcohol use: Not Currently  . Drug use: Not Currently    Types: Marijuana    Comment: Not since 02/13     Allergies   Bee venom, Cashew nut oil, Penicillins, Ativan [lorazepam], Lodine [etodolac], Prednisone, Cocoa butter, and Latex   Review of Systems Review of Systems  Constitutional: Negative for activity change, appetite change and fever.  HENT: Negative for facial swelling.   Eyes: Positive for photophobia. Negative for pain and visual disturbance.  Respiratory: Negative for shortness of breath.   Cardiovascular: Negative for chest pain and leg swelling.  Gastrointestinal: Positive for nausea and vomiting. Negative for abdominal pain.  Musculoskeletal: Negative for neck pain and neck stiffness.  Skin: Negative for rash and wound.  Neurological: Positive for headaches. Negative for dizziness, syncope, facial  asymmetry, speech difficulty, weakness and numbness.  Psychiatric/Behavioral: Negative for confusion and decreased concentration.     Physical Exam Updated Vital Signs BP 102/75 (BP Location: Right Arm)   Pulse 63   Temp 98.3 F (36.8 C) (Oral)   Resp 15   LMP 04/12/2019 (Approximate)   SpO2 100%   Physical Exam Vitals signs and nursing note reviewed.  Constitutional:      General: She is not in acute distress.    Appearance: Normal appearance. She is well-developed. She is not toxic-appearing.  HENT:     Head: Normocephalic.     Mouth/Throat:     Mouth: Mucous membranes are moist.     Pharynx: Oropharynx  is clear.  Eyes:     Extraocular Movements: Extraocular movements intact.     Conjunctiva/sclera: Conjunctivae normal.     Pupils: Pupils are equal, round, and reactive to light.  Neck:     Musculoskeletal: Full passive range of motion without pain, normal range of motion and neck supple. No neck rigidity, spinous process tenderness or muscular tenderness.     Trachea: Phonation normal.     Meningeal: Kernig's sign absent.  Cardiovascular:     Rate and Rhythm: Normal rate and regular rhythm.     Pulses: Normal pulses.  Pulmonary:     Effort: Pulmonary effort is normal. No respiratory distress.     Breath sounds: Normal breath sounds.  Abdominal:     General: There is no distension.     Palpations: Abdomen is soft.     Tenderness: There is no abdominal tenderness.  Musculoskeletal: Normal range of motion.     Right lower leg: No edema.     Left lower leg: No edema.  Skin:    General: Skin is warm.     Capillary Refill: Capillary refill takes less than 2 seconds.     Findings: No rash.  Neurological:     General: No focal deficit present.     Mental Status: She is alert and oriented to person, place, and time.     GCS: GCS eye subscore is 4. GCS verbal subscore is 5. GCS motor subscore is 6.     Cranial Nerves: No cranial nerve deficit.     Sensory: Sensation is  intact. No sensory deficit.     Motor: No weakness or abnormal muscle tone.     Gait: Gait is intact. Gait normal.     Deep Tendon Reflexes:     Reflex Scores:      Tricep reflexes are 2+ on the right side and 2+ on the left side.      Bicep reflexes are 2+ on the right side and 2+ on the left side.    Comments: CN III-XII grossly intact  Psychiatric:        Thought Content: Thought content normal.      ED Treatments / Results  Labs (all labs ordered are listed, but only abnormal results are displayed) Labs Reviewed  URINALYSIS, ROUTINE W REFLEX MICROSCOPIC    EKG None  Radiology No results found.  Procedures Procedures (including critical care time)  Medications Ordered in ED Medications  sodium chloride 0.9 % bolus 1,000 mL (1,000 mLs Intravenous New Bag/Given 07/09/19 1932)  diphenhydrAMINE (BENADRYL) injection 25 mg (25 mg Intravenous Given 07/09/19 1932)  metoCLOPramide (REGLAN) injection 10 mg (10 mg Intravenous Given 07/09/19 1932)     Initial Impression / Assessment and Plan / ED Course  I have reviewed the triage vital signs and the nursing notes.  Pertinent labs & imaging results that were available during my care of the patient were reviewed by me and considered in my medical decision making (see chart for details).    Pt with recurrent migraine headaches. Reports pain as similar to previous headaches.  Upon entering the exam room, pt is lying on stretcher watching TV and talking with family member at bedside.  She is well appearing.  Mucous membranes are moist.  No nuchal rigidity, no focal neuro deficits.      2025  Notified by nursing staff that patient had a family emergency and needed to leave prior to completion of work-up.  Patient had not provided urine sample.  Patient signed out AMA.  She did state to nursing staff that her headache had improved and she was feeling better.  Final Clinical Impressions(s) / ED Diagnoses   Final diagnoses:  Migraine  without status migrainosus, not intractable, unspecified migraine type    ED Discharge Orders    None       Pauline Ausriplett, Everlean Bucher, PA-C 07/09/19 2319    Bethann BerkshireZammit, Joseph, MD 07/11/19 [phone]

## 2019-07-09 NOTE — ED Triage Notes (Signed)
Patient c/o migraine headache that started this morning upon waking. Patient reports sensitivity to light and sound. Denies any dizziness or blurred. Per patient hx of migraines. Patient states usually takes Excedrin for migraines but is currently [redacted] weeks pregnant. Patient reports taking tylenol at 6am and 12 pm with no relief. Patient does report she has high-risk pregnancy. Patient states she has had 2 miscarriages this year. Denies any bleeding or abd pain.

## 2019-07-13 ENCOUNTER — Encounter (HOSPITAL_COMMUNITY): Payer: Self-pay | Admitting: Emergency Medicine

## 2019-07-13 ENCOUNTER — Other Ambulatory Visit: Payer: Self-pay

## 2019-07-13 DIAGNOSIS — J45909 Unspecified asthma, uncomplicated: Secondary | ICD-10-CM | POA: Insufficient documentation

## 2019-07-13 DIAGNOSIS — F17210 Nicotine dependence, cigarettes, uncomplicated: Secondary | ICD-10-CM | POA: Diagnosis not present

## 2019-07-13 DIAGNOSIS — L509 Urticaria, unspecified: Secondary | ICD-10-CM | POA: Insufficient documentation

## 2019-07-13 NOTE — ED Triage Notes (Signed)
Pt C/O right heel pain around 1600 today. No erythema noted. Slight swelling noted. Denies injury to the area.

## 2019-07-14 ENCOUNTER — Emergency Department (HOSPITAL_COMMUNITY)
Admission: EM | Admit: 2019-07-14 | Discharge: 2019-07-14 | Discharge status: Against Medical Advice | Payer: Medicaid Other | Attending: Emergency Medicine | Admitting: Emergency Medicine

## 2019-07-14 DIAGNOSIS — L509 Urticaria, unspecified: Secondary | ICD-10-CM

## 2019-07-14 MED ORDER — FAMOTIDINE 20 MG PO TABS
20.0000 mg | ORAL_TABLET | Freq: Once | ORAL | Status: AC
  Administered 2019-07-14: 20 mg via ORAL
  Filled 2019-07-14: qty 1

## 2019-07-14 MED ORDER — DIPHENHYDRAMINE HCL 25 MG PO CAPS
25.0000 mg | ORAL_CAPSULE | Freq: Once | ORAL | Status: AC
  Administered 2019-07-14: 25 mg via ORAL
  Filled 2019-07-14: qty 1

## 2019-07-14 NOTE — ED Provider Notes (Signed)
[facility] Provider Note   CSN: [id number] Arrival date & time: 07/13/19  2201   Time seen 2:05 AM  History   Chief Complaint Chief Complaint  Patient presents with  . Foot Pain    HPI Erin Krueger is a 23 y.o. female.     HPI patient states about 4 PM she felt like she started having swelling and pain on ambulation in her right heel.  She denies any known injury.  She states while she was in the waiting room she has broken out in hives on her arms, legs, and around her waistline.  She complains of a lot of itching.  Patient states she has been on bedrest for 3 days because her white blood cell count was low and her glucose was low.  She states she is followed by Dr. Emelda FearFerguson and she is [redacted] weeks pregnant.  She states she has been seen at [facility] during this pregnancy, the last time 2 weeks ago.  She states she has seizures and they recently increased her Keppra from 750 twice a day to 1000 mg twice a day.  Her significant other states she has 0-6 seizures a day.  She has not had any seizures today and he thinks the last time was 4 to 7 days ago.  She states she has had a low-grade fever which is typical with her seasonal allergies which bother her this time a year.  She states she was on Macrobid for UTI but she finished that a week ago.  She denies any vaginal bleeding.  She states she has had hives in the past but mainly related to a bee sting however that also causes her throat to close and she requires an EpiPen.  She does states she changed her detergent from Tide to Gain this week.  [facility] Dr Emelda FearFerguson  Past Medical History:  Diagnosis Date  . ADHD (attention deficit hyperactivity disorder)   . Allergy   . Anxiety   . Asthma   . Eating disorder   . Headache(784.0)   . Kidney stone    kidney stones  . Migraines   . ODD (oppositional defiant disorder)   . PID (acute pelvic inflammatory  disease) 01/29/2016  . PTSD (post-traumatic stress disorder)   . Seizures [facility](HCC)    diagnosed age 89    Patient Active Problem List   Diagnosis Date Noted  . Trichomonal vaginitis during pregnancy 06/09/2019  . History of seizures 05/24/2019  . Encounter to determine fetal viability of pregnancy 05/24/2019  . Positive pregnancy test 05/24/2019  . History of medication noncompliance   . Status epilepticus (HCC) 11/29/2018  . Asthma 11/29/2018  . Seizures (HCC) 08/28/2018  . Third trimester pregnancy   . Persistent UTI (urinary tract infection) during pregnancy 04/08/2018  . Marijuana use 04/07/2018  . Supervision of normal pregnancy 04/05/2018  . Smoker 04/05/2018  . Death of child 04/05/2018  . 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 Breakthrough seizure (HCC) 01/07/2016  . Seizure (HCC) 01/07/2016  . 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 PTSD (post-traumatic stress disorder) 05/12/2013  . 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 Polysubstance abuse (HCC) 05/12/2013    Past Surgical History:  Procedure Laterality Date  . right knee surgery    . TONSILLECTOMY AND ADENOIDECTOMY       OB History    Gravida  4   Para  2   Term  2   Preterm      AB  1   Living  1     SAB  1   TAB      Ectopic      Multiple  0   Live Births  2            Home Medications    Prior to Admission medications   Medication Sig Start Date End Date Taking? Authorizing Provider  acetaminophen (TYLENOL) 500 MG tablet Take 500 mg by mouth every 6 (six) hours as needed for mild pain or moderate pain.    [provider]  albuterol (PROVENTIL HFA;VENTOLIN HFA) 108 (90 Base) MCG/ACT inhaler Inhale 2 puffs into the lungs every 6 (six) hours as needed for wheezing or shortness of breath. 04/05/18   Cheral MarkerBooker, Kimberly R, CNM  EPINEPHrine (EPIPEN 2-PAK) 0.3 mg/0.3 mL IJ SOAJ injection Inject 0.3 mLs (0.3 mg total) into the muscle as needed for  anaphylaxis. 06/09/19   Zadie RhineWickline, Donald, MD  ferrous sulfate 325 (65 FE) MG tablet Take 325 mg by mouth daily as needed (for iron defficiency).    [provider]  folic acid (FOLVITE) 1 MG tablet Take 4 mg daily Patient not taking: Reported on 06/19/2019 06/01/19   Adline PotterGriffin, Jennifer A, NP  levETIRAcetam (KEPPRA) 1000 MG tablet Take 1 tablet (1,000 mg total) by mouth 2 (two) times daily for 30 days. Patient not taking: Reported on 07/09/2019 04/26/19 06/18/28  Carlyle BasquesHernandez, Ana P, PA-C  levETIRAcetam (KEPPRA) 750 MG tablet Take 750 mg by mouth 2 (two) times daily.    [provider]  nitrofurantoin, macrocrystal-monohydrate, (MACROBID) 100 MG capsule Take 1 capsule (100 mg total) by mouth 2 (two) times daily. Patient not taking: Reported on 06/19/2019 06/13/19   Gerrit HeckEmly, Jessica, CNM  Potassium 99 MG TABS Take 1 tablet by mouth daily as needed (for defficiency).    [provider]    Family History Family History  Problem Relation Age of Onset  . Diabetes Mother   . Diabetes Sister   . Breast cancer Maternal Grandmother   . Diabetes Maternal Grandmother   . Glaucoma Maternal Grandmother   . Other Son        born with hole in heart  . Other Daughter        born with hole in heart    Social History Social History   Tobacco Use  . Smoking status: Current Every Day Smoker    Packs/day: 0.50    Types: Cigarettes, E-cigarettes  . Smokeless tobacco: Never Used  . Tobacco comment: Not currently since 02/13  Substance Use Topics  . Alcohol use: Not Currently  . Drug use: Not Currently    Types: Marijuana    Comment: Not since 02/13     Allergies   Bee venom, Cashew nut oil, Penicillins, Ativan [lorazepam], Lodine [etodolac], Prednisone, Cocoa butter, and Latex   Review of Systems Review of Systems  All other systems reviewed and are negative.    Physical Exam Updated Vital Signs BP 109/73   Pulse 76   Temp 98.7 F (37.1 C) (Oral)   Resp 17   Ht 5\' 7"   (1.702 m)   Wt 79.4 kg   LMP 04/12/2019 (Approximate)   SpO2 100%   BMI 27.41 kg/m   Physical Exam Vitals signs and nursing note reviewed.  Constitutional:      Appearance: Normal appearance.     Comments: Patient is sound asleep with her significant other on the  stretcher.  HENT:     Head: Normocephalic and atraumatic.     Right Ear: External ear normal.     Left Ear: External ear normal.  Eyes:     Extraocular Movements: Extraocular movements intact.     Conjunctiva/sclera: Conjunctivae normal.  Neck:     Musculoskeletal: Normal range of motion.  Cardiovascular:     Rate and Rhythm: Normal rate.  Pulmonary:     Effort: Pulmonary effort is normal. No respiratory distress.  Musculoskeletal: Normal range of motion.        General: Tenderness present.     Comments: When I examine her right foot her Achilles tendon is nontender and she has no pain in her Achilles tendon on dorsi flexion and extension.  She is tender in the heel of her foot with some mild swelling and firmness.  I suspect she is having a urticarial reaction in her heel.  Skin:    General: Skin is warm and dry.     Capillary Refill: Capillary refill takes less than 2 seconds.     Findings: Rash present.     Comments: Patient is noted to have small urticarial lesions mainly on her anterior right knee, her left mid lower leg, scattered on her forearms, and larger lesions around her waist area both frontal and back.  She is seem to be scratching once awake.  Neurological:     General: No focal deficit present.     Mental Status: She is alert and oriented to person, place, and time.     Cranial Nerves: No cranial nerve deficit.  Psychiatric:        Mood and Affect: Mood normal.        Behavior: Behavior normal.        Thought Content: Thought content normal.      ED Treatments / Results  Labs (all labs ordered are listed, but only abnormal results are displayed) Labs Reviewed - No data to display  EKG None   Radiology No results found.  Procedures Procedures (including critical care time)  Medications Ordered in ED Medications  diphenhydrAMINE (BENADRYL) capsule 25 mg (25 mg Oral Given 07/14/19 0242)  famotidine (PEPCID) tablet 20 mg (20 mg Oral Given 07/14/19 0242)     Initial Impression / Assessment and Plan / ED Course  I have reviewed the triage vital signs and the nursing notes.  Pertinent labs & imaging results that were available during my care of the patient were reviewed by me and considered in my medical decision making (see chart for details).   When I look in patient's chart I do not see any records for recent [facility] hospital visits or [facility].  I do not see any recent blood work that shows a low white blood cell count.  Patient was given Benadryl and Pepcid orally for her hives.  She has an allergy to prednisone.  3:30 AM nurses report patient ambulated without limping from the ED and without notifying staff she was leaving.  Final Clinical Impressions(s) / ED Diagnoses   Final diagnoses:  Urticaria    Patient left without notifying staff.  Rolland Porter, MD, Barbette Or, MD 07/14/19 [phone]

## 2019-07-14 NOTE — ED Notes (Signed)
Pt and family given blanket for comfort. Reassurance given to pt and family- informed that EDP was working to see pt as fast as possible.

## 2019-07-14 NOTE — ED Notes (Signed)
Pt seen leaving the department with steady gate.

## 2019-07-14 NOTE — ED Notes (Signed)
Dr. Knapp made aware of pt leaving.  

## 2019-07-18 ENCOUNTER — Other Ambulatory Visit: Payer: Self-pay | Admitting: Obstetrics and Gynecology

## 2019-07-18 DIAGNOSIS — Z3682 Encounter for antenatal screening for nuchal translucency: Secondary | ICD-10-CM

## 2019-07-19 ENCOUNTER — Encounter: Payer: Self-pay | Admitting: Advanced Practice Midwife

## 2019-07-19 ENCOUNTER — Other Ambulatory Visit: Payer: Self-pay

## 2019-07-19 ENCOUNTER — Ambulatory Visit (INDEPENDENT_AMBULATORY_CARE_PROVIDER_SITE_OTHER): Payer: Medicaid Other | Admitting: Advanced Practice Midwife

## 2019-07-19 ENCOUNTER — Ambulatory Visit (INDEPENDENT_AMBULATORY_CARE_PROVIDER_SITE_OTHER): Payer: Medicaid Other

## 2019-07-19 ENCOUNTER — Ambulatory Visit: Payer: Medicaid Other | Admitting: *Deleted

## 2019-07-19 VITALS — BP 113/73 | HR 80 | Wt 168.0 lb

## 2019-07-19 DIAGNOSIS — O0990 Supervision of high risk pregnancy, unspecified, unspecified trimester: Secondary | ICD-10-CM | POA: Insufficient documentation

## 2019-07-19 DIAGNOSIS — Z3682 Encounter for antenatal screening for nuchal translucency: Secondary | ICD-10-CM | POA: Diagnosis not present

## 2019-07-19 DIAGNOSIS — O23591 Infection of other part of genital tract in pregnancy, first trimester: Secondary | ICD-10-CM

## 2019-07-19 DIAGNOSIS — A5901 Trichomonal vulvovaginitis: Secondary | ICD-10-CM

## 2019-07-19 DIAGNOSIS — Z634 Disappearance and death of family member: Secondary | ICD-10-CM

## 2019-07-19 DIAGNOSIS — Z3A12 12 weeks gestation of pregnancy: Secondary | ICD-10-CM | POA: Diagnosis not present

## 2019-07-19 DIAGNOSIS — Z3481 Encounter for supervision of other normal pregnancy, first trimester: Secondary | ICD-10-CM

## 2019-07-19 DIAGNOSIS — Z3490 Encounter for supervision of normal pregnancy, unspecified, unspecified trimester: Secondary | ICD-10-CM | POA: Insufficient documentation

## 2019-07-19 DIAGNOSIS — Z3400 Encounter for supervision of normal first pregnancy, unspecified trimester: Secondary | ICD-10-CM

## 2019-07-19 DIAGNOSIS — Z331 Pregnant state, incidental: Secondary | ICD-10-CM

## 2019-07-19 DIAGNOSIS — Z1379 Encounter for other screening for genetic and chromosomal anomalies: Secondary | ICD-10-CM

## 2019-07-19 DIAGNOSIS — Z1389 Encounter for screening for other disorder: Secondary | ICD-10-CM

## 2019-07-19 DIAGNOSIS — Z1371 Encounter for nonprocreative screening for genetic disease carrier status: Secondary | ICD-10-CM

## 2019-07-19 LAB — POCT URINALYSIS DIPSTICK OB
Blood, UA: NEGATIVE
Glucose, UA: NEGATIVE
Ketones, UA: NEGATIVE
Nitrite, UA: NEGATIVE
POC,PROTEIN,UA: NEGATIVE

## 2019-07-19 MED ORDER — BLOOD PRESSURE MONITOR MISC: 0 refills | Status: DC

## 2019-07-19 MED ORDER — METOCLOPRAMIDE HCL 10 MG PO TABS: 10.0000 mg | ORAL_TABLET | Freq: Four times a day (QID) | ORAL | 1 refills | Status: DC | PRN

## 2019-07-19 NOTE — Progress Notes (Signed)
[place] 12+3 wks,measurements c/w dates,crl 59.50 mm,fhr 166 bpm,NB present,NT 1.6 mm,posterior placenta gr 0

## 2019-07-19 NOTE — Progress Notes (Signed)
INITIAL OBSTETRICAL VISIT Patient name: Erin Krueger MRN [id number]  Date of birth: 02-Nov-1996 Chief Complaint:   Initial Prenatal Visit (nt/it, migraines)  History of Present Illness:   Erin Krueger is a 23 y.o. 454P2011 Caucasian female at 8154w3d by LMP/US with an Estimated Date of Delivery: 01/28/20 being seen today for her initial obstetrical visit.   Her obstetrical history is significant for child that died after birth of cardiac defect; term SVD 12/19. Has seizures/pseudoseizures.  Reports that they are "all the time" (last a few weeks ago) and that her keppra dose was increased.  Neurology suspects that some of her "seizures" are "malingering/pseudoseizures".  Today she reports headache. That tylenol doesn't help.  ED mD recommended reglan/benadryl. Also having some nausea Patient's last menstrual period was 04/12/2019 (approximate). Last pap 5/19. Results were: normal Review of Systems:   Pertinent items are noted in HPI Denies cramping/contractions, leakage of fluid, vaginal bleeding, abnormal vaginal discharge w/ itching/odor/irritation, headaches, visual changes, shortness of breath, chest pain, abdominal pain, severe nausea/vomiting, or problems with urination or bowel movements unless otherwise stated above.  Pertinent History Reviewed:  Reviewed past medical,surgical, social, obstetrical and family history.  Reviewed problem list, medications and allergies. OB History  Gravida Para Term Preterm AB Living  4 2 2   1 1   SAB TAB Ectopic Multiple Live Births  1     0 2    # Outcome Date GA Lbr Len/2nd Weight Sex Delivery Anes PTL Lv  4 Current           3 Term 11/08/18 1127w6d 00:15 / 00:18 7 lb 3.5 oz (3.275 kg) M Vag-Spont EPI  LIV     Birth Comments: WNL  2 Term 06/23/17 232w0d  6 lb 8 oz (2.948 kg) F Vag-Spont EPI N DEC     Birth Comments: cardiac defect  1 SAB            Physical Assessment:   Vitals:   07/19/19 1413  BP: 113/73  Pulse: 80  Weight: 168 lb (76.2 kg)   Body mass index is 26.31 kg/m.       Physical Examination:  General appearance - well appearing, and in no distress  Mental status - alert, oriented to person, place, and time  Psych:  She has a normal mood and affect  Skin - warm and dry, normal color, no suspicious lesions noted  Chest - effort normal, all lung fields clear to auscultation bilaterally  Heart - normal rate and regular rhythm  Abdomen - soft, nontender  Extremities:  No swelling or varicosities noted   Pelvic - VULVA: normal appearing vulva with no masses, tenderness or lesions  VAGINA: normal appearing vagina with normal color and discharge, no lesions  Wet prep negative. CERVIX: normal appearing cervix without discharge or lesions, no CMT   Fetal Heart Rate (bpm): 166 via US US 12+3 wks,measurements c/w dates,crl 59.50 mm,fhr 166 bpm,NB present,NT 1.6 mm,posterior placenta gr 0 Results for orders placed or performed in visit on 07/19/19 (from the past 24 hour(s))  POC Urinalysis Dipstick OB   Collection Time: 07/19/19  2:35 PM  Result Value Ref Range   Color, UA     Clarity, UA     Glucose, UA Negative Negative   Bilirubin, UA     Ketones, UA neg    Spec Grav, UA     Blood, UA neg    pH, UA     POC,PROTEIN,UA Negative Negative, Trace, Small (1+), Moderate (2+), Large (  3+), 4+   Urobilinogen, UA     Nitrite, UA neg    Leukocytes, UA Trace (A) Negative   Appearance     Odor      Assessment & Plan:  1) Low-Risk Pregnancy G4P2011 at [redacted]w[redacted]d with an Estimated Date of Delivery: 01/28/20   2) Initial OB visit  3) Trich>POC neg  4) HA/nausea:  rx reglan/benadryl Meds:  Meds ordered this encounter  Medications  . Blood Pressure Monitor MISC    Sig: For regular home bp monitoring during pregnancy    Dispense:  1 each    Refill:  0    Z34.90  . metoCLOPramide (REGLAN) 10 MG tablet    Sig: Take 1 tablet (10 mg total) by mouth every 6 (six) hours as needed for nausea.    Dispense:  60 tablet    Refill:  1     Order Specific Question:   Supervising Provider    Answer:   Tania Ade H [[id number]]    Initial labs obtained Continue prenatal vitamins Reviewed n/v relief measures and warning s/s to report Reviewed recommended weight gain based on pre-gravid BMI Encouraged well-balanced diet Watched video for carrier screening/genetic testing:  Genetic Screening discussed First Screen and Integrated Screen: requested Cystic fibrosis screening results reviewed SMA screening requested Fragile X screening requested Ultrasound discussed; fetal survey: requested CCNC completed  Follow-up: Return in about 3 weeks (around 08/09/2019) for LROB and 7 weeks for anatomy scan/LROB.   Orders Placed This Encounter  Procedures  . GC/Chlamydia Probe Amp  . Urine Culture  . Obstetric Panel, Including HIV  . Sickle cell screen  . Urinalysis, Routine w reflex microscopic  . Integrated 1  . MaterniT 21 plus Core, Blood  . Pain Management Screening Profile (10S)  . Fragile X, PCR and Southern  . SMN1 COPY NUMBER ANALYSIS (SMA Carrier Screen)  . POC Urinalysis Dipstick OB    Christin Fudge DNP, CNM 07/19/2019 3:06 PM

## 2019-07-21 LAB — URINE CULTURE

## 2019-07-23 LAB — GC/CHLAMYDIA PROBE AMP
Chlamydia trachomatis, NAA: NEGATIVE
Neisseria Gonorrhoeae by PCR: NEGATIVE

## 2019-07-24 LAB — INTEGRATED 1
Crown Rump Length: 59.5 mm
Gest. Age on Collection Date: 12.3 weeks
Maternal Age at EDD: 22.9 yr
Nuchal Translucency (NT): 1.6 mm
Number of Fetuses: 1
PAPP-A Value: 1796.6 ng/mL
Weight: 168 [lb_av]

## 2019-07-24 LAB — OBSTETRIC PANEL, INCLUDING HIV
Antibody Screen: NEGATIVE
Basophils Absolute: 0 10*3/uL (ref 0.0–0.2)
Basos: 0 %
EOS (ABSOLUTE): 0.2 10*3/uL (ref 0.0–0.4)
Eos: 2 %
HIV Screen 4th Generation wRfx: NONREACTIVE
Hematocrit: 39.2 % (ref 34.0–46.6)
Hemoglobin: 13.3 g/dL (ref 11.1–15.9)
Hepatitis B Surface Ag: NEGATIVE
Immature Grans (Abs): 0 10*3/uL (ref 0.0–0.1)
Immature Granulocytes: 0 %
Lymphocytes Absolute: 2.8 10*3/uL (ref 0.7–3.1)
Lymphs: 28 %
MCH: 30.4 pg (ref 26.6–33.0)
MCHC: 33.9 g/dL (ref 31.5–35.7)
MCV: 90 fL (ref 79–97)
Monocytes Absolute: 0.8 10*3/uL (ref 0.1–0.9)
Monocytes: 8 %
Neutrophils Absolute: 6.2 10*3/uL (ref 1.4–7.0)
Neutrophils: 62 %
Platelets: 239 10*3/uL (ref 150–450)
RBC: 4.37 x10E6/uL (ref 3.77–5.28)
RDW: 12.6 % (ref 11.7–15.4)
RPR Ser Ql: NONREACTIVE
Rh Factor: POSITIVE
Rubella Antibodies, IGG: 8.56 index (ref >0.99)
WBC: 10 10*3/uL (ref 3.4–10.8)

## 2019-07-24 LAB — MICROSCOPIC EXAMINATION: Casts: NONE SEEN /lpf

## 2019-07-24 LAB — MATERNIT 21 PLUS CORE, BLOOD
Fetal Fraction: 10
Result (T21): NEGATIVE
Trisomy 13 (Patau syndrome): NEGATIVE
Trisomy 18 (Edwards syndrome): NEGATIVE
Trisomy 21 (Down syndrome): NEGATIVE

## 2019-07-24 LAB — URINALYSIS, ROUTINE W REFLEX MICROSCOPIC
Bilirubin, UA: NEGATIVE
Glucose, UA: NEGATIVE
Ketones, UA: NEGATIVE
Nitrite, UA: NEGATIVE
RBC, UA: NEGATIVE
Specific Gravity, UA: 1.014 (ref 1.005–1.030)
Urobilinogen, Ur: 0.2 mg/dL (ref 0.2–1.0)
pH, UA: 7.5 (ref 5.0–7.5)

## 2019-07-24 LAB — SICKLE CELL SCREEN: Sickle Cell Screen: NEGATIVE

## 2019-07-25 LAB — PMP SCREEN PROFILE (10S), URINE
Amphetamine Scrn, Ur: NEGATIVE ng/mL
BARBITURATE SCREEN URINE: NEGATIVE ng/mL
BENZODIAZEPINE SCREEN, URINE: NEGATIVE ng/mL
CANNABINOIDS UR QL SCN: NEGATIVE ng/mL
Cocaine (Metab) Scrn, Ur: NEGATIVE ng/mL
Creatinine(Crt), U: 146.3 mg/dL (ref 20.0–300.0)
Methadone Screen, Urine: NEGATIVE ng/mL
OXYCODONE+OXYMORPHONE UR QL SCN: NEGATIVE ng/mL
Opiate Scrn, Ur: NEGATIVE ng/mL
Ph of Urine: 8.5 (ref 4.5–8.9)
Phencyclidine Qn, Ur: NEGATIVE ng/mL
Propoxyphene Scrn, Ur: NEGATIVE ng/mL

## 2019-08-04 LAB — FRAGILE X, PCR AND SOUTHERN

## 2019-08-04 LAB — SMN1 COPY NUMBER ANALYSIS (SMA CARRIER SCREENING)

## 2019-08-09 ENCOUNTER — Telehealth: Payer: Medicaid Other | Admitting: Obstetrics and Gynecology

## 2019-08-22 ENCOUNTER — Encounter: Payer: Medicaid Other | Admitting: Obstetrics & Gynecology

## 2019-09-03 ENCOUNTER — Emergency Department (HOSPITAL_COMMUNITY)
Admission: EM | Admit: 2019-09-03 | Discharge: 2019-09-03 | Disposition: A | Discharge status: Home / Self Care | Payer: Medicaid Other | Attending: Emergency Medicine | Admitting: Emergency Medicine

## 2019-09-03 ENCOUNTER — Other Ambulatory Visit: Payer: Self-pay

## 2019-09-03 ENCOUNTER — Encounter (HOSPITAL_COMMUNITY): Payer: Self-pay | Admitting: Emergency Medicine

## 2019-09-03 DIAGNOSIS — J45909 Unspecified asthma, uncomplicated: Secondary | ICD-10-CM | POA: Insufficient documentation

## 2019-09-03 DIAGNOSIS — F17210 Nicotine dependence, cigarettes, uncomplicated: Secondary | ICD-10-CM | POA: Diagnosis not present

## 2019-09-03 DIAGNOSIS — R509 Fever, unspecified: Secondary | ICD-10-CM | POA: Diagnosis not present

## 2019-09-03 DIAGNOSIS — R1033 Periumbilical pain: Secondary | ICD-10-CM | POA: Diagnosis not present

## 2019-09-03 DIAGNOSIS — J029 Acute pharyngitis, unspecified: Secondary | ICD-10-CM

## 2019-09-03 DIAGNOSIS — R0981 Nasal congestion: Secondary | ICD-10-CM

## 2019-09-03 DIAGNOSIS — Z91040 Latex allergy status: Secondary | ICD-10-CM | POA: Diagnosis not present

## 2019-09-03 DIAGNOSIS — Z79899 Other long term (current) drug therapy: Secondary | ICD-10-CM | POA: Diagnosis not present

## 2019-09-03 LAB — CBC WITH DIFFERENTIAL/PLATELET
Abs Immature Granulocytes: 0.07 10*3/uL (ref 0.00–0.07)
Basophils Absolute: 0 10*3/uL (ref 0.0–0.1)
Basophils Relative: 0 %
Eosinophils Absolute: 0.3 10*3/uL (ref 0.0–0.5)
Eosinophils Relative: 2 %
HCT: 35.6 % — ABNORMAL LOW (ref 36.0–46.0)
Hemoglobin: 12 g/dL (ref 12.0–15.0)
Immature Granulocytes: 1 %
Lymphocytes Relative: 15 %
Lymphs Abs: 2.1 10*3/uL (ref 0.7–4.0)
MCH: 31.6 pg (ref 26.0–34.0)
MCHC: 33.7 g/dL (ref 30.0–36.0)
MCV: 93.7 fL (ref 80.0–100.0)
Monocytes Absolute: 0.9 10*3/uL (ref 0.1–1.0)
Monocytes Relative: 7 %
Neutro Abs: 10.3 10*3/uL — ABNORMAL HIGH (ref 1.7–7.7)
Neutrophils Relative %: 75 %
Platelets: 220 10*3/uL (ref 150–400)
RBC: 3.8 MIL/uL — ABNORMAL LOW (ref 3.87–5.11)
RDW: 13.1 % (ref 11.5–15.5)
WBC: 13.7 10*3/uL — ABNORMAL HIGH (ref 4.0–10.5)
nRBC: 0 % (ref 0.0–0.2)

## 2019-09-03 LAB — URINALYSIS, ROUTINE W REFLEX MICROSCOPIC
Bilirubin Urine: NEGATIVE
Glucose, UA: NEGATIVE mg/dL
Hgb urine dipstick: NEGATIVE
Ketones, ur: NEGATIVE mg/dL
Nitrite: NEGATIVE
Protein, ur: 30 mg/dL — AB
Specific Gravity, Urine: 1.02 (ref 1.005–1.030)
pH: 7 (ref 5.0–8.0)

## 2019-09-03 LAB — COMPREHENSIVE METABOLIC PANEL
ALT: 9 U/L (ref 0–44)
AST: 9 U/L — ABNORMAL LOW (ref 15–41)
Albumin: 2.9 g/dL — ABNORMAL LOW (ref 3.5–5.0)
Alkaline Phosphatase: 63 U/L (ref 38–126)
Anion gap: 7 (ref 5–15)
BUN: 10 mg/dL (ref 6–20)
CO2: 22 mmol/L (ref 22–32)
Calcium: 8.4 mg/dL — ABNORMAL LOW (ref 8.9–10.3)
Chloride: 108 mmol/L (ref 98–111)
Creatinine, Ser: 0.48 mg/dL (ref 0.44–1.00)
GFR calc Af Amer: >60 mL/min (ref >60)
GFR calc non Af Amer: >60 mL/min (ref >60)
Glucose, Bld: 86 mg/dL (ref 70–99)
Potassium: 3.8 mmol/L (ref 3.5–5.1)
Sodium: 137 mmol/L (ref 135–145)
Total Bilirubin: 0.2 mg/dL — ABNORMAL LOW (ref 0.3–1.2)
Total Protein: 6.3 g/dL — ABNORMAL LOW (ref 6.5–8.1)

## 2019-09-03 LAB — LIPASE, BLOOD: Lipase: 28 U/L (ref 11–51)

## 2019-09-03 NOTE — ED Provider Notes (Signed)
[facility] Provider Note   CSN: [id number] Arrival date & time: 09/03/19  1446     History   Chief Complaint Chief Complaint  Patient presents with  . Sore Throat  . Abdominal Pain    HPI Erin Krueger is a 23 y.o. female with a history of asthma, allergy, migraines, ptsd, seizures and currently [redacted] weeks pregnant presenting with multiple complaints, the first being suspicion for sinus infection as she reports frontal headache along with thick, colored nasal discharge, post nasal drip along with fever to 101.6 over the past week.  Additionally reports sore throat but feels this is triggered by the PND.    Secondly, she has complaint of periumbilical pain which as been present for about the past 10 days, describes intermittent sharp stabbing pain, worsened with movement and certain positions such as lying flat.  She reports cloudy urine but no dysuria but with increased frequency, no diarrhea, constipation, no vaginal dc.  She states her sx do not remind her of round ligament pain like during prior pregnancies.  She is scheduled to see [facility] in 3 days for further eval of this pain.  Good appetite reported.      The history is provided by the patient.    Past Medical History:  Diagnosis Date  . ADHD (attention deficit hyperactivity disorder)   . Allergy   . Anxiety   . Asthma   . Eating disorder   . Headache(784.0)   . Kidney stone    kidney stones  . Migraines   . ODD (oppositional defiant disorder)   . PID (acute pelvic inflammatory disease) 01/29/2016  . PTSD (post-traumatic stress disorder)   . Seizures [facility](HCC)    diagnosed age 89    Patient Active Problem List   Diagnosis Date Noted  . Supervision of normal pregnancy 07/19/2019  . Trichomonal vaginitis during pregnancy 06/09/2019  . History of medication noncompliance   . Status epilepticus (HCC) 11/29/2018  . Asthma 11/29/2018  . Seizures (HCC) 08/28/2018  . Marijuana use 04/07/2018  .  Smoker 04/05/2018  . Death of child 04/05/2018  . Borderline personality disorder (HCC) 01/26/2017  . 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 Bipolar 1 disorder (HCC) 05/09/2016  . Breakthrough seizure (HCC) 01/07/2016  . Seizure (HCC) 01/07/2016  . 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 PTSD (post-traumatic stress disorder) 05/12/2013  . 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 Polysubstance abuse (HCC) 05/12/2013    Past Surgical History:  Procedure Laterality Date  . right knee surgery    . TONSILLECTOMY AND ADENOIDECTOMY       OB History    Gravida  4   Para  2   Term  2   Preterm      AB  1   Living  1     SAB  1   TAB      Ectopic      Multiple  0   Live Births  2            Home Medications    Prior to Admission medications   Medication Sig Start Date End Date Taking? Authorizing Provider  acetaminophen (TYLENOL) 500 MG tablet Take 500 mg by mouth every 6 (six) hours as needed for mild pain or moderate pain.    [provider]  albuterol (PROVENTIL HFA;VENTOLIN HFA) 108 (90 Base) MCG/ACT inhaler Inhale 2 puffs into the lungs every 6 (six) hours as needed for wheezing or shortness of  breath. Patient not taking: Reported on 07/19/2019 04/05/18   Cheral Marker, CNM  EPINEPHrine (EPIPEN 2-PAK) 0.3 mg/0.3 mL IJ SOAJ injection Inject 0.3 mLs (0.3 mg total) into the muscle as needed for anaphylaxis. Patient not taking: Reported on 07/19/2019 06/09/19   Zadie Rhine, MD  levETIRAcetam (KEPPRA) 750 MG tablet Take 750 mg by mouth 2 (two) times daily.    [provider]  metoCLOPramide (REGLAN) 10 MG tablet Take 1 tablet (10 mg total) by mouth every 6 (six) hours as needed for nausea. 07/19/19   Jacklyn Shell, CNM    Family History Family History  Problem Relation Age of Onset  . Diabetes Mother   . Diabetes Sister   . Breast cancer Maternal Grandmother   . Diabetes Maternal  Grandmother   . Glaucoma Maternal Grandmother   . Other Son        born with hole in heart  . Other Daughter        born with hole in heart    Social History Social History   Tobacco Use  . Smoking status: Current Some Day Smoker    Packs/day: 0.50    Types: Cigarettes, E-cigarettes  . Smokeless tobacco: Never Used  . Tobacco comment: nicotine patch  Substance Use Topics  . Alcohol use: Not Currently  . Drug use: Not Currently    Types: Marijuana    Comment: Not since 02/13     Allergies   Bee venom, Cashew nut oil, Penicillins, Ativan [lorazepam], Lodine [etodolac], Prednisone, Cocoa butter, and Latex   Review of Systems Review of Systems  Constitutional: Positive for chills and fever.  HENT: Positive for congestion, sinus pain and sore throat. Negative for ear pain.   Eyes: Negative.   Respiratory: Negative for chest tightness and shortness of breath.   Cardiovascular: Negative for chest pain.  Gastrointestinal: Positive for abdominal pain. Negative for diarrhea, nausea and vomiting.  Genitourinary: Positive for frequency. Negative for dysuria, urgency and vaginal discharge.  Musculoskeletal: Negative for arthralgias, joint swelling and neck pain.  Skin: Negative.  Negative for rash and wound.  Neurological: Negative for dizziness, weakness, light-headedness, numbness and headaches.  Psychiatric/Behavioral: Negative.      Physical Exam Updated Vital Signs BP 123/79 (BP Location: Right Arm)   Pulse 88   Temp 98.7 F (37.1 C) (Oral)   Resp 16   Ht 5\' 7"  (1.702 m)   Wt 76.2 kg   LMP 04/12/2019 (Approximate)   SpO2 99%   BMI 26.31 kg/m   Physical Exam Vitals signs and nursing note reviewed.  Constitutional:      Appearance: She is well-developed.  HENT:     Head: Normocephalic and atraumatic.     Nose:     Right Sinus: Frontal sinus tenderness present.     Left Sinus: Frontal sinus tenderness present.  Eyes:     Conjunctiva/sclera: Conjunctivae  normal.  Neck:     Musculoskeletal: Normal range of motion.  Cardiovascular:     Rate and Rhythm: Normal rate and regular rhythm.     Heart sounds: Normal heart sounds.  Pulmonary:     Effort: Pulmonary effort is normal.     Breath sounds: Normal breath sounds. No wheezing.  Abdominal:     General: Bowel sounds are normal.     Palpations: Abdomen is soft.     Tenderness: There is abdominal tenderness in the right upper quadrant and right lower quadrant. There is no guarding or rebound.  Musculoskeletal: Normal range of  motion.  Skin:    General: Skin is warm and dry.  Neurological:     Mental Status: She is alert.      ED Treatments / Results  Labs (all labs ordered are listed, but only abnormal results are displayed) Labs Reviewed  CBC WITH DIFFERENTIAL/PLATELET - Abnormal; Notable for the following components:      Result Value   WBC 13.7 (*)    RBC 3.80 (*)    HCT 35.6 (*)    Neutro Abs 10.3 (*)    All other components within normal limits  COMPREHENSIVE METABOLIC PANEL - Abnormal; Notable for the following components:   Calcium 8.4 (*)    Total Protein 6.3 (*)    Albumin 2.9 (*)    AST 9 (*)    Total Bilirubin 0.2 (*)    All other components within normal limits  LIPASE, BLOOD  URINALYSIS, ROUTINE W REFLEX MICROSCOPIC    EKG None  Radiology No results found.  Procedures Procedures (including critical care time)  Medications Ordered in ED Medications - No data to display   Initial Impression / Assessment and Plan / ED Course  I have reviewed the triage vital signs and the nursing notes.  Pertinent labs & imaging results that were available during my care of the patient were reviewed by me and considered in my medical decision making (see chart for details).        Labs completed except for urinalysis.  Informed by RN that patient eloped, stating she had to go home, no reason given.  I was not notified of this until after she left the dept.  Work up  incomplete, wbc count elevated, UA not resulted, possible uti, but also with RUQ and RLQ pain, felt pt would need US imaging to assess for normal appendicitis.  She does have f/u with her gyn in 3 days.  Hopefully will return for any worsened sx.   Final Clinical Impressions(s) / ED Diagnoses   Final diagnoses:  Sore throat  Sinus congestion  Febrile illness  Periumbilical abdominal pain    ED Discharge Orders    None       Landis Martins 09/03/19 1821    Carmin Muskrat, MD 09/03/19 2042

## 2019-09-03 NOTE — ED Notes (Signed)
Pt leaving AMA. States her mother is watching her child and mother has to go to hospital so there is no child care. Erin Krueger made aware.

## 2019-09-03 NOTE — ED Triage Notes (Signed)
Pt states she has had a sore throat with a cough for a week. Is also [redacted] weeks pregnant and has been having abd pain around her navel with spotting 2 days ago. Called ob and was scheduled for appt next week. Tested negative for covid 1 week ago.

## 2019-09-03 NOTE — ED Notes (Signed)
Pt seen ambulating out of department with significant other in no distress.

## 2019-09-05 ENCOUNTER — Other Ambulatory Visit: Payer: Self-pay | Admitting: Advanced Practice Midwife

## 2019-09-05 DIAGNOSIS — Z363 Encounter for antenatal screening for malformations: Secondary | ICD-10-CM

## 2019-09-06 ENCOUNTER — Other Ambulatory Visit: Payer: Self-pay

## 2019-09-06 ENCOUNTER — Ambulatory Visit (INDEPENDENT_AMBULATORY_CARE_PROVIDER_SITE_OTHER): Payer: Medicaid Other

## 2019-09-06 ENCOUNTER — Ambulatory Visit (INDEPENDENT_AMBULATORY_CARE_PROVIDER_SITE_OTHER): Payer: Medicaid Other | Admitting: Obstetrics and Gynecology

## 2019-09-06 VITALS — BP 127/75 | HR 106 | Wt 178.2 lb

## 2019-09-06 DIAGNOSIS — Z3A19 19 weeks gestation of pregnancy: Secondary | ICD-10-CM | POA: Diagnosis not present

## 2019-09-06 DIAGNOSIS — Z3482 Encounter for supervision of other normal pregnancy, second trimester: Secondary | ICD-10-CM

## 2019-09-06 DIAGNOSIS — Z363 Encounter for antenatal screening for malformations: Secondary | ICD-10-CM

## 2019-09-06 DIAGNOSIS — Z1389 Encounter for screening for other disorder: Secondary | ICD-10-CM

## 2019-09-06 DIAGNOSIS — Z331 Pregnant state, incidental: Secondary | ICD-10-CM

## 2019-09-06 LAB — POCT URINALYSIS DIPSTICK OB
Blood, UA: NEGATIVE
Glucose, UA: NEGATIVE
Ketones, UA: NEGATIVE
Leukocytes, UA: NEGATIVE
Nitrite, UA: NEGATIVE
POC,PROTEIN,UA: NEGATIVE

## 2019-09-06 NOTE — Progress Notes (Addendum)
[place] 17+7 wks,cephalic,cx 4.3 cm,posterior placenta gr 0,normal ovaries bilat,LVECIF 1.8 mm,svp of fluid 4.8 cm,fhr 130 bpm,efw 313 g 65%,anatomy complete

## 2019-09-06 NOTE — Addendum Note (Signed)
Addended by: Diona Fanti A on: 09/06/2019 05:06 PM   Modules accepted: Orders

## 2019-09-06 NOTE — Progress Notes (Signed)
Patient ID: Erin Krueger, female   DOB: September 11, 1996, 23 y.o.   MRN: [id number]    LOW-RISK PREGNANCY VISIT Patient name: Erin Krueger MRN [id number]  Date of birth: [birth year] Chief Complaint:   Routine Prenatal Visit  History of Present Illness:   Erin Krueger is a 23 y.o. [phone] female at [redacted]w[redacted]d with an Estimated Date of Delivery: 01/28/20 being seen today for ongoing management of a low-risk pregnancy. Considering Nexplanon again and would like to try again after this pregnancy.  Appt done before anatomy scan.Today she reports no complaints. Contractions: Not present. Vag. Bleeding: None.  Movement: Present. denies leaking of fluid. Review of Systems:   Pertinent items are noted in HPI Denies abnormal vaginal discharge w/ itching/odor/irritation, headaches, visual changes, shortness of breath, chest pain, abdominal pain, severe nausea/vomiting, or problems with urination or bowel movements unless otherwise stated above. Pertinent History Reviewed:  Reviewed past medical,surgical, social, obstetrical and family history.  Reviewed problem list, medications and allergies. Physical Assessment:   Vitals:   09/06/19 1459  BP: 127/75  Pulse: (!) 106  Weight: 178 lb 3.2 oz (80.8 kg)  Body mass index is 27.91 kg/m.        Physical Examination:   General appearance: Well appearing, and in no distress  Mental status: Alert, oriented to person, place, and time  Skin: Warm & dry  Cardiovascular: Normal heart rate noted  Respiratory: Normal respiratory effort, no distress  Abdomen: Soft, gravid, nontender  Pelvic: Cervical exam deferred         Extremities: Edema: None  Fetal Status: Fetal Heart Rate (bpm): 157   Movement: Present    No results found for this or any previous visit (from the past 24 hour(s)).  Assessment & Plan:  1) Low-risk pregnancy G4P2011 at [redacted]w[redacted]d with an Estimated Date of Delivery: 01/28/20    Meds: No orders of the defined types were placed in this encounter.   Labs/procedures today: u/s today  Plan:  Continue routine obstetrical care  Dr. Carmell Austria will review u/s and call pt with results  Follow-up: Return in about 4 weeks (around 10/04/2019).  No orders of the defined types were placed in this encounter.  By signing my name below, I, Samul Dada, attest that this documentation has been prepared under the direction and in the presence of Jonnie Kind, MD. Electronically Signed: [place]. 09/06/19. 3:12 PM.  I personally performed the services described in this documentation, which was SCRIBED in my presence. The recorded information has been reviewed and considered accurate. It has been edited as necessary during review. Jonnie Kind, MD

## 2019-10-04 ENCOUNTER — Ambulatory Visit (INDEPENDENT_AMBULATORY_CARE_PROVIDER_SITE_OTHER): Payer: Medicaid Other | Admitting: Obstetrics and Gynecology

## 2019-10-04 ENCOUNTER — Other Ambulatory Visit: Payer: Self-pay

## 2019-10-04 ENCOUNTER — Encounter: Payer: Self-pay | Admitting: Obstetrics and Gynecology

## 2019-10-04 VITALS — BP 121/88 | HR 96 | Wt 192.0 lb

## 2019-10-04 DIAGNOSIS — Z3482 Encounter for supervision of other normal pregnancy, second trimester: Secondary | ICD-10-CM

## 2019-10-04 DIAGNOSIS — Z1389 Encounter for screening for other disorder: Secondary | ICD-10-CM

## 2019-10-04 DIAGNOSIS — Z0282 Encounter for adoption services: Secondary | ICD-10-CM | POA: Insufficient documentation

## 2019-10-04 DIAGNOSIS — Z789 Other specified health status: Secondary | ICD-10-CM

## 2019-10-04 DIAGNOSIS — Z634 Disappearance and death of family member: Secondary | ICD-10-CM

## 2019-10-04 DIAGNOSIS — Z331 Pregnant state, incidental: Secondary | ICD-10-CM

## 2019-10-04 DIAGNOSIS — Z3A23 23 weeks gestation of pregnancy: Secondary | ICD-10-CM

## 2019-10-04 HISTORY — DX: Encounter for adoption services: Z02.82

## 2019-10-04 HISTORY — DX: Other specified health status: Z78.9

## 2019-10-04 LAB — POCT URINALYSIS DIPSTICK OB
Blood, UA: NEGATIVE
Glucose, UA: NEGATIVE
Ketones, UA: NEGATIVE
Leukocytes, UA: NEGATIVE
Nitrite, UA: NEGATIVE
POC,PROTEIN,UA: NEGATIVE

## 2019-10-04 NOTE — Progress Notes (Signed)
Patient ID: Erin Krueger, female   DOB: [birth year], 23 y.o.   MRN: [id number]    LOW-RISK PREGNANCY VISIT Patient name: Erin Krueger MRN [id number]  Date of birth: [birth year] Chief Complaint:   Routine Prenatal Visit history of VSD x 2 consecutive pregnancies.needs ped Echo. History of Present Illness:   Erin Krueger is a 23 y.o. [phone] female at [redacted]w[redacted]d with an Estimated Date of Delivery: 01/28/20 being seen today for ongoing management of a low-risk pregnancy. Around 37 weeks she had increase of seizures, CHART review states these were PSEUDOSEIZURES, she would like to be induced around that time of 37 weeks. Pt informed this is  Not likely an option.  She claims her body can't handle being pregnant anymore Today she reports no complaints. Contractions: Not present. Vag. Bleeding: None.  Movement: Present. denies leaking of fluid. Review of Systems:   Pertinent items are noted in HPI Denies abnormal vaginal discharge w/ itching/odor/irritation, headaches, visual changes, shortness of breath, chest pain, abdominal pain, severe nausea/vomiting, or problems with urination or bowel movements unless otherwise stated above. Pertinent History Reviewed:  Reviewed past medical,surgical, social, obstetrical and family history.  Reviewed problem list, medications and allergies. Physical Assessment:   Vitals:   10/04/19 1536  BP: 121/88  Pulse: 96  Weight: 192 lb (87.1 kg)  Body mass index is 30.07 kg/m.        Physical Examination:   General appearance: Well appearing, and in no distress  Mental status: Alert, oriented to person, place, and time  Skin: Warm & dry  Cardiovascular: Normal heart rate noted  Respiratory: Normal respiratory effort, no distress  Abdomen: Soft, gravid, nontender  Pelvic: Cervical exam deferred         Extremities: Edema: None  Fetal Status: Fetal Heart Rate (bpm): 147   Movement: Present    Chaperone: Arnette Norris    Results for orders placed or performed  in visit on 10/04/19 (from the past 24 hour(s))  POC Urinalysis Dipstick OB   Collection Time: 10/04/19  3:37 PM  Result Value Ref Range   Color, UA     Clarity, UA     Glucose, UA Negative Negative   Bilirubin, UA     Ketones, UA neg    Spec Grav, UA     Blood, UA neg    pH, UA     POC,PROTEIN,UA Negative Negative, Trace, Small (1+), Moderate (2+), Large (3+), 4+   Urobilinogen, UA     Nitrite, UA neg    Leukocytes, UA Negative Negative   Appearance     Odor      Assessment & Plan:  1) Low-risk pregnancy G4P2011 at [redacted]w[redacted]d with an Estimated Date of Delivery: 01/28/20  2) Hx 2 infants with cardiac defects=> will refer to Dr Elizebeth Brooking for Echo, L Cresenzo to complete consult. Office of Dr Elizebeth Brooking contacted.  3) bv, metronidazole   Meds: No orders of the defined types were placed in this encounter.  Labs/procedures today: None  Plan:  Continue routine obstetrical care  Next visit: will be in person for PN2    Reviewed: Preterm labor symptoms and general obstetric precautions including but not limited to vaginal bleeding, contractions, leaking of fluid and fetal movement were reviewed in detail with the patient.   Follow-up: 4 wk here, Peds Cardiology being arranged.Dr Elizebeth Brooking office.  Orders Placed This Encounter  Procedures  . POC Urinalysis Dipstick OB   By signing my name below, I, Arnette Norris, attest that this documentation has been  prepared under the direction and in the presence of Jonnie Kind, MD. Electronically Signed: [place]. 10/04/19. 3:58 PM.  I personally performed the services described in this documentation, which was SCRIBED in my presence. The recorded information has been reviewed and considered accurate. It has been edited as necessary during review. Jonnie Kind, MD

## 2019-10-18 ENCOUNTER — Other Ambulatory Visit: Payer: Self-pay

## 2019-10-18 DIAGNOSIS — Z20822 Contact with and (suspected) exposure to covid-19: Secondary | ICD-10-CM

## 2019-10-19 LAB — NOVEL CORONAVIRUS, NAA: SARS-CoV-2, NAA: NOT DETECTED

## 2019-11-01 ENCOUNTER — Encounter: Payer: Medicaid Other | Admitting: Advanced Practice Midwife

## 2019-11-01 ENCOUNTER — Other Ambulatory Visit: Payer: Medicaid Other

## 2019-11-07 ENCOUNTER — Other Ambulatory Visit: Payer: Medicaid Other

## 2019-11-07 ENCOUNTER — Other Ambulatory Visit: Payer: Self-pay

## 2019-11-07 ENCOUNTER — Ambulatory Visit (INDEPENDENT_AMBULATORY_CARE_PROVIDER_SITE_OTHER): Payer: Medicaid Other | Admitting: Family Medicine

## 2019-11-07 VITALS — BP 130/84 | HR 104 | Wt 208.0 lb

## 2019-11-07 DIAGNOSIS — O0993 Supervision of high risk pregnancy, unspecified, third trimester: Secondary | ICD-10-CM

## 2019-11-07 DIAGNOSIS — Z23 Encounter for immunization: Secondary | ICD-10-CM

## 2019-11-07 DIAGNOSIS — O0990 Supervision of high risk pregnancy, unspecified, unspecified trimester: Secondary | ICD-10-CM

## 2019-11-07 DIAGNOSIS — Z3A28 28 weeks gestation of pregnancy: Secondary | ICD-10-CM

## 2019-11-07 DIAGNOSIS — Z3483 Encounter for supervision of other normal pregnancy, third trimester: Secondary | ICD-10-CM

## 2019-11-07 DIAGNOSIS — F1910 Other psychoactive substance abuse, uncomplicated: Secondary | ICD-10-CM

## 2019-11-07 DIAGNOSIS — O99323 Drug use complicating pregnancy, third trimester: Secondary | ICD-10-CM

## 2019-11-07 DIAGNOSIS — Z1389 Encounter for screening for other disorder: Secondary | ICD-10-CM

## 2019-11-07 DIAGNOSIS — O23593 Infection of other part of genital tract in pregnancy, third trimester: Secondary | ICD-10-CM

## 2019-11-07 DIAGNOSIS — Z331 Pregnant state, incidental: Secondary | ICD-10-CM

## 2019-11-07 DIAGNOSIS — O23591 Infection of other part of genital tract in pregnancy, first trimester: Secondary | ICD-10-CM

## 2019-11-07 DIAGNOSIS — Z634 Disappearance and death of family member: Secondary | ICD-10-CM

## 2019-11-07 DIAGNOSIS — A5901 Trichomonal vulvovaginitis: Secondary | ICD-10-CM

## 2019-11-07 DIAGNOSIS — O99213 Obesity complicating pregnancy, third trimester: Secondary | ICD-10-CM | POA: Insufficient documentation

## 2019-11-07 LAB — POCT URINALYSIS DIPSTICK OB
Blood, UA: NEGATIVE
Glucose, UA: NEGATIVE
Ketones, UA: NEGATIVE
Nitrite, UA: NEGATIVE

## 2019-11-07 NOTE — Progress Notes (Signed)
    PRENATAL VISIT NOTE  Subjective:  Erin Krueger is a 23 y.o. [phone] at [redacted]w[redacted]d being seen today for ongoing prenatal care.  She is currently monitored for the following issues for this high-risk pregnancy and has PTSD (post-traumatic stress disorder); ADHD (attention deficit hyperactivity disorder), combined type; Polysubstance abuse ([place]); Conduct disorder, adolescent-onset type; Breakthrough seizure ([place]); Seizure ([place]); Pseudoseizures; Smoker; Death of child; Marijuana use; Status epilepticus ([place]); Asthma; Trichomonal vaginitis during pregnancy; Bipolar 1 disorder ([place]); Borderline personality disorder ([place]); Supervision of high risk pregnancy, antepartum; Adopted person; and Obesity affecting pregnancy in third trimester on their problem list.  Patient reports seizure last week. Reports 3-4 seizures per month. Has not seen neurology. asking about IOL at 37 wk for seizures. Patient is very talktative. .  Contractions: Irregular. Vag. Bleeding: None.  Movement: Present. Denies leaking of fluid.   The following portions of the patient's history were reviewed and updated as appropriate: allergies, current medications, past family history, past medical history, past social history, past surgical history and problem list. Problem list updated.  Objective:   Vitals:   11/07/19 0914  BP: 130/84  Pulse: (!) 104  Weight: 208 lb (94.3 kg)    Fetal Status:     Movement: Present     General:  Alert, oriented and cooperative. Patient is in no acute distress.  Skin: Skin is warm and dry. No rash noted.   Cardiovascular: Normal heart rate noted  Respiratory: Normal respiratory effort, no problems with respiration noted  Abdomen: Soft, gravid, appropriate for gestational age.  Pain/Pressure: Present     Pelvic: Cervical exam deferred        Extremities: Normal range of motion.     Mental Status: Normal mood and affect. Normal behavior. Normal judgment and thought content.   Assessment and  Plan:  Pregnancy: G4P2011 at [redacted]w[redacted]d  1. Screening for genitourinary condition - POC Urinalysis Dipstick OB  2. Pregnant state, incidental - POC Urinalysis Dipstick OB  3. Encounter for supervision of other normal pregnancy in third trimester Reviewed typical IOL electively at 39 wks Consider IOL at 38 only if seizures are truly uncontrolled and MFM evaluation to be considered Encouraged her to reach out neurology about possibly uncontrolled seizures -- currently on keppra for control. Reports she is trying to switch neurology groups and PCP care to [place] area.  Typical 28 wk labs today Considering nexplanon- discussed this can be placed in hospital. Of note patient expressed a strong desire to prevent pregnancy.  She reported she "will have 3 children under 3 when this child is born"  per patient. She speak of her deceased daughter like she is still living.   4. Polysubstance abuse ([place]) Denies current use  5. Trichomonal vaginitis during pregnancy in first trimester TOC neg  6. History of familial congenital heart disease - echo WNL  Preterm labor symptoms and general obstetric precautions including but not limited to vaginal bleeding, contractions, leaking of fluid and fetal movement were reviewed in detail with the patient. Please refer to After Visit Summary for other counseling recommendations.  Return in about 2 weeks (around 11/21/2019) for Routine prenatal care, Telehealth/Virtual health OB Visit.  Future Appointments  Date Time Provider [place]  11/22/2019  3:30 PM Myrtis Ser, CNM [facility]    Caren Macadam, MD

## 2019-11-08 LAB — CBC
Hematocrit: 36.3 % (ref 34.0–46.6)
Hemoglobin: 12.4 g/dL (ref 11.1–15.9)
MCH: 31.7 pg (ref 26.6–33.0)
MCHC: 34.2 g/dL (ref 31.5–35.7)
MCV: 93 fL (ref 79–97)
Platelets: 216 10*3/uL (ref 150–450)
RBC: 3.91 x10E6/uL (ref 3.77–5.28)
RDW: 12 % (ref 11.7–15.4)
WBC: 15.8 10*3/uL — ABNORMAL HIGH (ref 3.4–10.8)

## 2019-11-08 LAB — RPR: RPR Ser Ql: NONREACTIVE

## 2019-11-08 LAB — ANTIBODY SCREEN: Antibody Screen: NEGATIVE

## 2019-11-08 LAB — HIV ANTIBODY (ROUTINE TESTING W REFLEX): HIV Screen 4th Generation wRfx: NONREACTIVE

## 2019-11-08 LAB — GLUCOSE TOLERANCE, 2 HOURS W/ 1HR
Glucose, 1 hour: 124 mg/dL (ref 65–179)
Glucose, 2 hour: 45 mg/dL — ABNORMAL LOW (ref 65–152)
Glucose, Fasting: 84 mg/dL (ref 65–91)

## 2019-11-14 ENCOUNTER — Other Ambulatory Visit: Payer: Self-pay

## 2019-11-14 ENCOUNTER — Inpatient Hospital Stay (HOSPITAL_COMMUNITY)
Admission: AD | Admit: 2019-11-14 | Discharge: 2019-11-14 | Disposition: A | Discharge status: Home / Self Care | Payer: Medicaid Other | Attending: Family Medicine | Admitting: Family Medicine

## 2019-11-14 ENCOUNTER — Encounter (HOSPITAL_COMMUNITY): Payer: Self-pay | Admitting: Family Medicine

## 2019-11-14 DIAGNOSIS — F17210 Nicotine dependence, cigarettes, uncomplicated: Secondary | ICD-10-CM | POA: Insufficient documentation

## 2019-11-14 DIAGNOSIS — Z79899 Other long term (current) drug therapy: Secondary | ICD-10-CM | POA: Insufficient documentation

## 2019-11-14 DIAGNOSIS — R102 Pelvic and perineal pain: Secondary | ICD-10-CM | POA: Insufficient documentation

## 2019-11-14 DIAGNOSIS — R1032 Left lower quadrant pain: Secondary | ICD-10-CM | POA: Diagnosis not present

## 2019-11-14 DIAGNOSIS — Z91018 Allergy to other foods: Secondary | ICD-10-CM | POA: Insufficient documentation

## 2019-11-14 DIAGNOSIS — Z833 Family history of diabetes mellitus: Secondary | ICD-10-CM | POA: Diagnosis not present

## 2019-11-14 DIAGNOSIS — Z91030 Bee allergy status: Secondary | ICD-10-CM | POA: Insufficient documentation

## 2019-11-14 DIAGNOSIS — Z803 Family history of malignant neoplasm of breast: Secondary | ICD-10-CM | POA: Insufficient documentation

## 2019-11-14 DIAGNOSIS — Z3A29 29 weeks gestation of pregnancy: Secondary | ICD-10-CM | POA: Insufficient documentation

## 2019-11-14 DIAGNOSIS — Z91040 Latex allergy status: Secondary | ICD-10-CM | POA: Diagnosis not present

## 2019-11-14 DIAGNOSIS — J45909 Unspecified asthma, uncomplicated: Secondary | ICD-10-CM | POA: Diagnosis not present

## 2019-11-14 DIAGNOSIS — B373 Candidiasis of vulva and vagina: Secondary | ICD-10-CM | POA: Diagnosis not present

## 2019-11-14 DIAGNOSIS — R1031 Right lower quadrant pain: Secondary | ICD-10-CM | POA: Diagnosis not present

## 2019-11-14 DIAGNOSIS — Z888 Allergy status to other drugs, medicaments and biological substances status: Secondary | ICD-10-CM | POA: Diagnosis not present

## 2019-11-14 DIAGNOSIS — O99891 Other specified diseases and conditions complicating pregnancy: Secondary | ICD-10-CM | POA: Insufficient documentation

## 2019-11-14 DIAGNOSIS — Z3689 Encounter for other specified antenatal screening: Secondary | ICD-10-CM | POA: Diagnosis not present

## 2019-11-14 DIAGNOSIS — O98813 Other maternal infectious and parasitic diseases complicating pregnancy, third trimester: Secondary | ICD-10-CM | POA: Insufficient documentation

## 2019-11-14 DIAGNOSIS — Z886 Allergy status to analgesic agent status: Secondary | ICD-10-CM | POA: Diagnosis not present

## 2019-11-14 DIAGNOSIS — B379 Candidiasis, unspecified: Secondary | ICD-10-CM

## 2019-11-14 DIAGNOSIS — O99333 Smoking (tobacco) complicating pregnancy, third trimester: Secondary | ICD-10-CM | POA: Diagnosis not present

## 2019-11-14 DIAGNOSIS — Z91048 Other nonmedicinal substance allergy status: Secondary | ICD-10-CM | POA: Insufficient documentation

## 2019-11-14 DIAGNOSIS — O99513 Diseases of the respiratory system complicating pregnancy, third trimester: Secondary | ICD-10-CM | POA: Diagnosis not present

## 2019-11-14 DIAGNOSIS — Z880 Allergy status to penicillin: Secondary | ICD-10-CM | POA: Diagnosis not present

## 2019-11-14 LAB — URINALYSIS, ROUTINE W REFLEX MICROSCOPIC
Bilirubin Urine: NEGATIVE
Glucose, UA: NEGATIVE mg/dL
Hgb urine dipstick: NEGATIVE
Ketones, ur: NEGATIVE mg/dL
Nitrite: NEGATIVE
Protein, ur: 30 mg/dL — AB
Specific Gravity, Urine: 1.021 (ref 1.005–1.030)
pH: 7 (ref 5.0–8.0)

## 2019-11-14 LAB — WET PREP, GENITAL
Clue Cells Wet Prep HPF POC: NONE SEEN
Sperm: NONE SEEN
Trich, Wet Prep: NONE SEEN

## 2019-11-14 MED ORDER — CYCLOBENZAPRINE HCL 10 MG PO TABS: 10.0000 mg | ORAL_TABLET | Freq: Two times a day (BID) | ORAL | 0 refills | Status: DC | PRN

## 2019-11-14 MED ORDER — CYCLOBENZAPRINE HCL 10 MG PO TABS
10.0000 mg | ORAL_TABLET | Freq: Once | ORAL | Status: AC
  Administered 2019-11-14: 10 mg via ORAL
  Filled 2019-11-14: qty 1

## 2019-11-14 MED ORDER — TERCONAZOLE 0.8 % VA CREA: 1.0000 | TOPICAL_CREAM | Freq: Every day | VAGINAL | 0 refills | Status: DC

## 2019-11-14 MED ORDER — ACETAMINOPHEN 500 MG PO TABS
1000.0000 mg | ORAL_TABLET | Freq: Once | ORAL | Status: AC
  Administered 2019-11-14: 1000 mg via ORAL
  Filled 2019-11-14: qty 2

## 2019-11-14 NOTE — MAU Provider Note (Signed)
History     CSN: [id number]  Arrival date and time: 11/14/19 2080   First Provider Initiated Contact with Patient 11/14/19 1937      Chief Complaint  Patient presents with  . Abdominal Pain  . vaginal pressure   HPI Erin Krueger is a 23 y.o. [phone] at [redacted]w[redacted]d who presents to MAU with chief complaints of vaginal pressure and abdominal pain. These are recurrent problems, onset one week ago.  Abdominal Pain Patient endorses bilateral lower abdominal pain which radiates distally toward her groin. She rates her pain as 3-4/10 at rest and 8/10 with movement. Her pain is especially great with walking, prolonged standing and repositioning in bed. She took 500 mg of Tylenol yesterday but did not experience relief. She has not tried other treatments and has not taken any medication today.   Vaginal pressure Patient endorses recurrent irritation and discomfort in her vagina. She is unable to provide pain score due to her abdominal pain. She denies aggravating or alleviating factors. She endorses increased vaginal discharge as her baseline.  Most recent intercourse two days ago. Patient states her intercourse slightly aggravated her abdominal pain. She denies vaginal bleeding, leaking of fluid, decreased fetal movement, fever, falls, or recent illness.   Patient states she checked her own cervix at home about 5 hours before coming to MAU.  OB History    Gravida  4   Para  2   Term  2   Preterm      AB  1   Living  1     SAB  1   TAB      Ectopic      Multiple  0   Live Births  2           Past Medical History:  Diagnosis Date  . ADHD (attention deficit hyperactivity disorder)   . Allergy   . Anxiety   . Asthma   . Eating disorder   . Headache(784.0)   . Kidney stone    kidney stones  . Migraines   . ODD (oppositional defiant disorder)   . PID (acute pelvic inflammatory disease) 01/29/2016  . PTSD (post-traumatic stress disorder)   . Seizures ([place])     diagnosed age 45    Past Surgical History:  Procedure Laterality Date  . right knee surgery    . TONSILLECTOMY AND ADENOIDECTOMY      Family History  Problem Relation Age of Onset  . Diabetes Mother   . Diabetes Sister   . Breast cancer Maternal Grandmother   . Diabetes Maternal Grandmother   . Glaucoma Maternal Grandmother   . Other Son        born with hole in heart  . Other Daughter        born with hole in heart    Social History   Tobacco Use  . Smoking status: Current Some Day Smoker    Packs/day: 0.50    Types: Cigarettes, E-cigarettes  . Smokeless tobacco: Never Used  . Tobacco comment: nicotine patch  Substance Use Topics  . Alcohol use: Not Currently  . Drug use: Not Currently    Types: Marijuana    Comment: Not since 02/13    Allergies:  Allergies  Allergen Reactions  . Bee Venom Anaphylaxis  . Cashew Nut Oil Anaphylaxis  . Penicillins Anaphylaxis    Has patient had a PCN reaction causing immediate rash, facial/tongue/throat swelling, SOB or lightheadedness with hypotension: Unknown Has patient had a PCN reaction causing  severe rash involving mucus membranes or skin necrosis: Unknown Has patient had a PCN reaction that required hospitalization: Unknown Has patient had a PCN reaction occurring within the last 10 years: Unknown If all of the above answers are "NO", then may proceed with Cephalosporin use.   . Ativan [Lorazepam]     Aggressive-agitation  . Lodine [Etodolac] Nausea And Vomiting and Other (See Comments)    Patient states had vomiting when taking medication, and threw up blood; also complained of severe headaches while on medication  . Prednisone Other (See Comments)    Bleeding nose bleeding and internal bleeding  . Cocoa Butter Rash  . Latex Rash    Medications Prior to Admission  Medication Sig Dispense Refill Last Dose  . acetaminophen (TYLENOL) 500 MG tablet Take 500 mg by mouth every 6 (six) hours as needed for mild pain or  moderate pain.     Marland Kitchen. albuterol (PROVENTIL HFA;VENTOLIN HFA) 108 (90 Base) MCG/ACT inhaler Inhale 2 puffs into the lungs every 6 (six) hours as needed for wheezing or shortness of breath. 1 Inhaler 2   . EPINEPHrine (EPIPEN 2-PAK) 0.3 mg/0.3 mL IJ SOAJ injection Inject 0.3 mLs (0.3 mg total) into the muscle as needed for anaphylaxis. 1 each 1   . levETIRAcetam (KEPPRA) 750 MG tablet Take 750 mg by mouth 2 (two) times daily.     . metoCLOPramide (REGLAN) 10 MG tablet Take 1 tablet (10 mg total) by mouth every 6 (six) hours as needed for nausea. 60 tablet 1   . Prenatal MV-Min-FA-Omega-3 (PRENATAL GUMMIES/DHA & FA) 0.4-32.5 MG CHEW Chew by mouth.       Review of Systems  Constitutional: Negative for fever.  Gastrointestinal: Positive for abdominal pain. Negative for nausea and vomiting.  Genitourinary: Positive for pelvic pain and vaginal pain. Negative for dysuria, flank pain, vaginal bleeding and vaginal discharge.  Musculoskeletal: Negative for back pain.  All other systems reviewed and are negative.  Physical Exam   Blood pressure 136/82, pulse 80, temperature 98.6 F (37 C), temperature source Oral, resp. rate 20, height 5\' 7"  (1.702 m), weight 90.7 kg, last menstrual period 04/12/2019, SpO2 98 %, not currently breastfeeding.  Physical Exam  Nursing note and vitals reviewed. Constitutional: She is oriented to person, place, and time. She appears well-developed and well-nourished.  Cardiovascular: Normal rate.  Respiratory: Effort normal and breath sounds normal.  GI: Soft. She exhibits no distension. There is no abdominal tenderness. There is no rebound, no guarding and no CVA tenderness.  Gravid  Genitourinary:    Vaginal discharge present.     Genitourinary Comments: Large amount of thick white discharge on labia and throughout vaginal vault.   Neurological: She is alert and oriented to person, place, and time.  Skin: Skin is warm and dry.  Psychiatric: She has a normal mood and  affect. Her behavior is normal. Judgment and thought content normal.    MAU Course/MDM  Procedures  --Presents of squamous epithelial cells on abnormal UA suggested contaminated catch. Urine culture in work --Unable to collect FFN due to patient self exam. Cervix visually closed on sterile spec exam. Confirmed with digital exam --Discussed interventions for round ligament pain, pubic symphysis pain. Improved pain score 40 minutes after Tylenol and Flexeril. Outpatient prescription ordered. Advised patient to consider maternity belt, more supportive shoes, compression socks, elevating legs when at rest, sleeping in lateral with pillow between thighs --Reactive fetal tracing: baseline 135, mod var, pos accels, no decels --Toco: occasional UI  Patient  Vitals for the past 24 hrs:  BP Temp Temp src Pulse Resp SpO2 Height Weight  11/14/19 2049 136/82 98.6 F (37 C) Oral 80 20 -- -- --  11/14/19 1924 131/74 98.4 F (36.9 C) Oral 99 18 98 % 5\' 7"  (1.702 m) 90.7 kg   Results for orders placed or performed during the hospital encounter of 11/14/19 (from the past 24 hour(s))  Urinalysis, Routine w reflex microscopic     Status: Abnormal   Collection Time: 11/14/19  7:34 PM  Result Value Ref Range   Color, Urine YELLOW YELLOW   APPearance CLOUDY (A) CLEAR   Specific Gravity, Urine 1.021 1.005 - 1.030   pH 7.0 5.0 - 8.0   Glucose, UA NEGATIVE NEGATIVE mg/dL   Hgb urine dipstick NEGATIVE NEGATIVE   Bilirubin Urine NEGATIVE NEGATIVE   Ketones, ur NEGATIVE NEGATIVE mg/dL   Protein, ur 30 (A) NEGATIVE mg/dL   Nitrite NEGATIVE NEGATIVE   Leukocytes,Ua MODERATE (A) NEGATIVE   WBC, UA 11-20 0 - 5 WBC/hpf   Bacteria, UA RARE (A) NONE SEEN   Squamous Epithelial / LPF 11-20 0 - 5  Wet prep, genital     Status: Abnormal   Collection Time: 11/14/19  7:49 PM  Result Value Ref Range   Yeast Wet Prep HPF POC PRESENT (A) NONE SEEN   Trich, Wet Prep NONE SEEN NONE SEEN   Clue Cells Wet Prep HPF POC NONE  SEEN NONE SEEN   WBC, Wet Prep HPF POC FEW (A) NONE SEEN   Sperm NONE SEEN    Meds ordered this encounter  Medications  . acetaminophen (TYLENOL) tablet 1,000 mg  . cyclobenzaprine (FLEXERIL) tablet 10 mg  . terconazole (TERAZOL 3) 0.8 % vaginal cream    Sig: Place 1 applicator vaginally at bedtime. Apply nightly for three nights.    Dispense:  20 g    Refill:  0    Order Specific Question:   Supervising Provider    Answer:   11/16/19 [[id number]]  . cyclobenzaprine (FLEXERIL) 10 MG tablet    Sig: Take 1 tablet (10 mg total) by mouth 2 (two) times daily as needed for muscle spasms.    Dispense:  20 tablet    Refill:  0    Order Specific Question:   Supervising Provider    Answer:   Levie Heritage [[id number]]   Assessment and Plan  --23 y.o. G4P2011 at [redacted]w[redacted]d  --Reactive tracing --Round ligament pain --Vulvovaginal candidiasis --Urine culture in work --Discharge home in stable condition   F/U: --[facility] virtual appointment 11/22/2019  11/24/2019, CNM 11/14/2019, 9:11 PM

## 2019-11-14 NOTE — Discharge Instructions (Signed)
Round Ligament Pain during Pregnancy Many women will experience a type of pain referred to as "round ligament pain" during their pregnancy. This is associated with abdominal pain or discomfort. Since any type of abdominal pain during pregnancy can be disconcerting, it is important to talk about round ligament pain to relieve any anxiety or fears you may have regarding the symptoms you are feeling. Round ligament pain is due to normal changes that take place in the body during pregnancy. It is caused by stretching of the round ligaments attached to the uterus. More commonly it occurs on the right side of the pelvis. Round Ligament: An Overview Typically in the non-pregnant state the uterus is about the size of an apple or pear. There are thick ligaments which hold the uterus in place in the abdomen, referred to as round ligaments. During pregnancy, your uterus will expand in size and weight, and the ligaments supporting it will have to stretch, becoming longer and thinner. As these ligaments pull and tug they may irritate nearby nerve fibers, which causes pain. The severity of the pain in some cases can seem extreme. Some common symptoms of round ligament pain include: . Ligament spasms or contractions/cramps that trigger a sharp pain typically on the right side of the abdomen. . Pain upon waking or suddenly rolling over in your sleep. . Pain in the abdomen that is sharp brought on by exercise or other vigorous activity. Similar Problems Round ligament pain is often mistaken for other medical conditions because the symptoms are similar. Acute abdominal pain during pregnancy may also be a sign of other conditions including: . Abdominal cramps - Some abdominal pain is simply caused by change in bowel habits associated with pregnancy. Gas is a common problem that can cause sharp, shooting pain. You should always seek out medical care if your pain is accompanied by fever, chills, pain  upon urination or if you have difficulty walking. Further exams and tests will be conducted to ensure that you do not have a more serious condition. It is not uncommon for women with lower abdominal pain to have a urinary tract infection, thus you may also be asked for a urine sample. Treatment If all other conditions are ruled out you can treat your round ligament pain relatively easily. You may be advised to take some acetaminophen (Tylenol) to reduce the severity of any persistent pain and asked to reduce your activity level. You can apply a heating pad to the area of pain or take a warm bath. Lying on the opposite side of the pain may help as well. Most women will find relief from round ligament pain simply by altering their daily routines slightly. The good news is round ligament pain will disappear completely once you have given birth to your child!   Safe Medications in Pregnancy   Acne: Benzoyl Peroxide Salicylic Acid  Backache/Headache: Tylenol: 2 regular strength every 4 hours OR              2 Extra strength every 6 hours  Colds/Coughs/Allergies: Benadryl (alcohol free) 25 mg every 6 hours as needed Breath right strips Claritin Cepacol throat lozenges Chloraseptic throat spray Cold-Eeze- up to three times per day Cough drops, alcohol free Flonase (by prescription only) Guaifenesin Mucinex Robitussin DM (plain only, alcohol free) Saline nasal spray/drops Sudafed (pseudoephedrine) & Actifed ** use only after [redacted] weeks gestation and if you do not have high blood pressure Tylenol Vicks Vaporub Zinc lozenges Zyrtec   Constipation: Colace Ducolax suppositories Fleet  enema Glycerin suppositories Metamucil Milk of magnesia Miralax Senokot Smooth move tea  Diarrhea: Kaopectate Imodium A-D  *NO pepto Bismol  Hemorrhoids: Anusol Anusol HC Preparation H Tucks  Indigestion: Tums Maalox Mylanta Zantac  Pepcid  Insomnia: Benadryl (alcohol free)  25mg  every 6 hours as needed Tylenol PM Unisom, no Gelcaps  Leg Cramps: Tums MagGel  Nausea/Vomiting:  Bonine Dramamine Emetrol Ginger extract Sea bands Meclizine  Nausea medication to take during pregnancy:  Unisom (doxylamine succinate 25 mg tablets) Take one tablet daily at bedtime. If symptoms are not adequately controlled, the dose can be increased to a maximum recommended dose of two tablets daily (1/2 tablet in the morning, 1/2 tablet mid-afternoon and one at bedtime). Vitamin B6 100mg  tablets. Take one tablet twice a day (up to 200 mg per day).  Skin Rashes: Aveeno products Benadryl cream or 25mg  every 6 hours as needed Calamine Lotion 1% cortisone cream  Yeast infection: Gyne-lotrimin 7 Monistat 7   **If taking multiple medications, please check labels to avoid duplicating the same active ingredients **take medication as directed on the label ** Do not exceed 4000 mg of tylenol in 24 hours **Do not take medications that contain aspirin or ibuprofen

## 2019-11-14 NOTE — MAU Note (Signed)
Patient increasing pressure and pain in vaginal area for about a week. Patient states it is worse today. Pain 4/10 without movement, 8/10 with movement.

## 2019-11-15 LAB — GC/CHLAMYDIA PROBE AMP (~~LOC~~) NOT AT ARMC
Chlamydia: NEGATIVE
Comment: NEGATIVE
Comment: NORMAL
Neisseria Gonorrhea: NEGATIVE

## 2019-11-17 LAB — CULTURE, OB URINE: Culture: >=100000 — AB

## 2019-11-18 ENCOUNTER — Other Ambulatory Visit: Payer: Self-pay | Admitting: Advanced Practice Midwife

## 2019-11-18 MED ORDER — NITROFURANTOIN MONOHYD MACRO 100 MG PO CAPS: 100.0000 mg | ORAL_CAPSULE | Freq: Two times a day (BID) | ORAL | 0 refills | Status: DC

## 2019-11-18 NOTE — Progress Notes (Signed)
+  Urine culture. RX sent for Macrobid BID x 7 days to pharmacy on file. Mychart message sent to patient.   Marcille Buffy DNP, CNM  11/18/19  9:57 AM

## 2019-11-22 ENCOUNTER — Encounter: Payer: Self-pay | Admitting: *Deleted

## 2019-11-22 ENCOUNTER — Encounter: Payer: Self-pay | Admitting: Advanced Practice Midwife

## 2019-11-22 ENCOUNTER — Telehealth (INDEPENDENT_AMBULATORY_CARE_PROVIDER_SITE_OTHER): Payer: Medicaid Other | Admitting: Advanced Practice Midwife

## 2019-11-22 ENCOUNTER — Other Ambulatory Visit: Payer: Self-pay

## 2019-11-22 VITALS — BP 146/94 | HR 108 | Wt 195.0 lb

## 2019-11-22 DIAGNOSIS — O0990 Supervision of high risk pregnancy, unspecified, unspecified trimester: Secondary | ICD-10-CM

## 2019-11-22 DIAGNOSIS — Z3A30 30 weeks gestation of pregnancy: Secondary | ICD-10-CM

## 2019-11-22 DIAGNOSIS — O2343 Unspecified infection of urinary tract in pregnancy, third trimester: Secondary | ICD-10-CM | POA: Diagnosis not present

## 2019-11-22 DIAGNOSIS — O0993 Supervision of high risk pregnancy, unspecified, third trimester: Secondary | ICD-10-CM | POA: Diagnosis not present

## 2019-11-22 MED ORDER — NITROFURANTOIN MONOHYD MACRO 100 MG PO CAPS: 100.0000 mg | ORAL_CAPSULE | Freq: Every day | ORAL | 3 refills | Status: DC

## 2019-11-22 NOTE — Progress Notes (Signed)
TELEHEALTH VIRTUAL OBSTETRICS VISIT ENCOUNTER NOTE Patient name: Erin Krueger MRN [id number]  Date of birth: [birth year]  I connected with patient on 11/22/19 at  3:30 PM EST by MyChart and verified that I am speaking with the correct person using two identifiers. Due to COVID-19 recommendations, pt is not currently in our office.    I discussed the limitations, risks, security and privacy concerns of performing an evaluation and management service by telephone and the availability of in person appointments. I also discussed with the patient that there may be a patient responsible charge related to this service. The patient expressed understanding and agreed to proceed.  Chief Complaint:   High Risk Gestation  History of Present Illness:   Erin Krueger is a 23 y.o. [phone] female at [redacted]w[redacted]d with an Estimated Date of Delivery: 01/28/20 being evaluated today for ongoing management of a low-risk pregnancy.  Today she reports MSK discomfort. Contractions: Not present.  .  Movement: Present. denies leaking of fluid.  Had MAU visit 12/22 w/ preterm labor workup.  Review of Systems:   Pertinent items are noted in HPI Denies abnormal vaginal discharge w/ itching/odor/irritation, headaches, visual changes, shortness of breath, chest pain, abdominal pain, severe nausea/vomiting, or problems with urination or bowel movements unless otherwise stated above. Pertinent History Reviewed:  Reviewed past medical,surgical, social, obstetrical and family history.  Reviewed problem list, medications and allergies. Physical Assessment:   Vitals:   11/22/19 1528  BP: (!) 146/94  Pulse: (!) 108  Weight: 195 lb (88.5 kg)  Body mass index is 30.54 kg/m.        Physical Examination:   General:  Alert, oriented and cooperative.   Mental Status: Normal mood and affect perceived. Normal judgment and thought content.  Rest of physical exam deferred due to type of encounter  No results found for this or any  previous visit (from the past 24 hour(s)).  Assessment & Plan:  1) Pregnancy G4P2011 at [redacted]w[redacted]d with an Estimated Date of Delivery: 01/28/20   2) Recurrent UTIs in preg, +cultures July, Aug & Dec 2020> rx macrobid 100mg  qhs and will repeat culture at next visit  **Noted elevated BP after visit completed; Kristeen Miss attempting to reach pt for recheck> if >140/90 will need to go to MAU for eval**   Meds:  Meds ordered this encounter  Medications  . nitrofurantoin, macrocrystal-monohydrate, (MACROBID) 100 MG capsule    Sig: Take 1 capsule (100 mg total) by mouth at bedtime.    Dispense:  30 capsule    Refill:  3    Order Specific Question:   Supervising Provider    Answer:   Marjory Lies    Labs/procedures today: none  Plan:  Continue routine obstetrical care.  Has home bp cuff.  Check bp weekly, let us know if >140/90.  Next visit: prefers in person    Reviewed: Preterm labor symptoms and general obstetric precautions including but not limited to vaginal bleeding, contractions, leaking of fluid and fetal movement were reviewed in detail with the patient. The patient was advised to call back or seek an in-person office evaluation/go to [facility]U at [facility] for any urgent or concerning symptoms. All questions were answered. Please refer to After Visit Summary for other counseling recommendations.    I provided 15 minutes of non-face-to-face time during this encounter.  Follow-up: No follow-ups on file.  No orders of the defined types were placed in this encounter.  Myrtis Ser CNM 11/22/2019 3:51  PM  

## 2019-11-24 NOTE — L&D Delivery Note (Addendum)
OB/GYN Faculty Practice Delivery Note  Erin Johnsonis a 24 y.o. [id number] at [redacted]w[redacted]d presented for IOL d/t PEC w/ SF (headache, scotoma).  ROM: AROM 12/28/2019 @ 1448 with clear fluid GBS Status: Unknown, Adequate treatment with Ancef.  Labor Progress: . Patient presented to L&D for IOL d/t PEC w/ severe features. Initial SVE 1cm, induced with cytotec, foley bulb, pitocin, and AROM. She then progressed to complete.   Delivery Date/Time: 12/28/2019 @ 1540 Delivery: Called to room and patient was complete and pushing. Head position was ROA and delivered with ease over the perineum. Nuchal cord present but delivered through with somersault. Infant with spontaneous cry, placed on mother's abdomen, dried and stimulated. Cord clamped x 2 after 1-minute delay, and cut by FOB. Cord blood drawn. Placenta delivered spontaneously with gentle cord traction. Fundus firm with massage and pitocin started. Labia, perineum, vagina, and cervix inspected and significant for no lacerations.   Baby Weight: 2790 grams  Cord: central insertion, 3 vessel Placenta: Sent to pathology, Bilobed Complications: None Lacerations: None EBL: 100cc Analgesia: Epidural  Infant: APGAR (1 MIN): 8   APGAR (5 MINS): 9   Leonides Cave, DO, PGY-1 OBGYN Faculty Teaching Service  12/28/2019, 4:26 PM

## 2019-11-25 ENCOUNTER — Encounter (HOSPITAL_COMMUNITY): Payer: Self-pay

## 2019-11-25 ENCOUNTER — Other Ambulatory Visit: Payer: Self-pay

## 2019-11-25 DIAGNOSIS — O99891 Other specified diseases and conditions complicating pregnancy: Secondary | ICD-10-CM | POA: Insufficient documentation

## 2019-11-25 DIAGNOSIS — Z3A31 31 weeks gestation of pregnancy: Secondary | ICD-10-CM | POA: Diagnosis not present

## 2019-11-25 DIAGNOSIS — Z5321 Procedure and treatment not carried out due to patient leaving prior to being seen by health care provider: Secondary | ICD-10-CM | POA: Diagnosis not present

## 2019-11-25 DIAGNOSIS — K0889 Other specified disorders of teeth and supporting structures: Secondary | ICD-10-CM | POA: Diagnosis not present

## 2019-11-25 NOTE — ED Triage Notes (Signed)
Pt reports multiple "bad teeth" to left side (upper and lower). Pt reports pain x one week. Pt says went to dentist about 5 days ago and they said "couldn't do anything b/c she is [redacted] weeks pregnant". Pt reports pain still persists after Orajel and tylenol. Pt says she "isn't supposed to take anything stronger than tylenol b/c she is pregnant"

## 2019-11-26 ENCOUNTER — Emergency Department (HOSPITAL_COMMUNITY)
Admission: EM | Admit: 2019-11-26 | Discharge: 2019-11-26 | Disposition: A | Discharge status: Home / Self Care | Payer: Medicaid Other | Attending: Emergency Medicine | Admitting: Emergency Medicine

## 2019-11-28 ENCOUNTER — Other Ambulatory Visit: Payer: Self-pay

## 2019-11-28 ENCOUNTER — Ambulatory Visit
Admission: EM | Admit: 2019-11-28 | Discharge: 2019-11-28 | Disposition: A | Discharge status: Home / Self Care | Payer: Medicaid Other | Attending: Emergency Medicine | Admitting: Emergency Medicine

## 2019-11-28 DIAGNOSIS — J0140 Acute pansinusitis, unspecified: Secondary | ICD-10-CM

## 2019-11-28 MED ORDER — AZITHROMYCIN 250 MG PO TABS: 250.0000 mg | ORAL_TABLET | Freq: Every day | ORAL | 0 refills | Status: DC

## 2019-11-28 NOTE — ED Triage Notes (Signed)
Pt presents to UC w/ c/o productive cough, nasal/ head congestion x1 wk. Pt was tested 3 days ago for covid which was negative. Pt states husband had bronchitis this week. Pt is [redacted] wks pregnant. Would like relief from symptoms.

## 2019-11-28 NOTE — ED Provider Notes (Signed)
[facility]   [id number] 11/28/19 Arrival Time: 7989   CC: URI symptoms  SUBJECTIVE: History from: patient.  Erin Krueger is a 24 y.o. female hx significant for [redacted] weeks pregnant who presents with productive cough, sore throat, and sinus congestion/ pain x 1 week.  PT was tested for COVID 3 days ago and was negative.  Denies recent travel.  Has tried OTC medications without relief.  Symptoms are made worse at night.  Reports previous symptoms in the past with sinus infection.   Denies fever, chills, SOB, wheezing, chest pain, nausea, changes in bowel or bladder habits.    ROS: As per HPI.  All other pertinent ROS negative.     Past Medical History:  Diagnosis Date  . ADHD (attention deficit hyperactivity disorder)   . Allergy   . Anxiety   . Asthma   . Eating disorder   . Headache(784.0)   . Kidney stone    kidney stones  . Migraines   . ODD (oppositional defiant disorder)   . PID (acute pelvic inflammatory disease) 01/29/2016  . PTSD (post-traumatic stress disorder)   . Seizures (HCC)    diagnosed age 6   Past Surgical History:  Procedure Laterality Date  . right knee surgery    . TONSILLECTOMY AND ADENOIDECTOMY     Allergies  Allergen Reactions  . Bee Venom Anaphylaxis  . Cashew Nut Oil Anaphylaxis  . Penicillins Anaphylaxis    Has patient had a PCN reaction causing immediate rash, facial/tongue/throat swelling, SOB or lightheadedness with hypotension: Unknown Has patient had a PCN reaction causing severe rash involving mucus membranes or skin necrosis: Unknown Has patient had a PCN reaction that required hospitalization: Unknown Has patient had a PCN reaction occurring within the last 10 years: Unknown If all of the above answers are "NO", then may proceed with Cephalosporin use.   . Ativan [Lorazepam]     Aggressive-agitation  . Lodine [Etodolac] Nausea And Vomiting and Other (See Comments)    Patient states had vomiting when taking medication, and  threw up blood; also complained of severe headaches while on medication  . Prednisone Other (See Comments)    Bleeding nose bleeding and internal bleeding  . Cocoa Butter Rash  . Latex Rash   No current facility-administered medications on file prior to encounter.   Current Outpatient Medications on File Prior to Encounter  Medication Sig Dispense Refill  . acetaminophen (TYLENOL) 500 MG tablet Take 500 mg by mouth every 6 (six) hours as needed for mild pain or moderate pain.    Marland Kitchen levETIRAcetam (KEPPRA) 750 MG tablet Take 750 mg by mouth 2 (two) times daily.    . Prenatal MV-Min-FA-Omega-3 (PRENATAL GUMMIES/DHA & FA) 0.4-32.5 MG CHEW Chew by mouth.    . [DISCONTINUED] albuterol (PROVENTIL HFA;VENTOLIN HFA) 108 (90 Base) MCG/ACT inhaler Inhale 2 puffs into the lungs every 6 (six) hours as needed for wheezing or shortness of breath. (Patient not taking: Reported on 11/22/2019) 1 Inhaler 2  . [DISCONTINUED] metoCLOPramide (REGLAN) 10 MG tablet Take 1 tablet (10 mg total) by mouth every 6 (six) hours as needed for nausea. 60 tablet 1   Social History   Socioeconomic History  . Marital status: Significant Other    Spouse name: Not on file  . Number of children: 2  . Years of education: Not on file  . Highest education level: Not on file  Occupational History  . 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 Smoking status: Current  Some Day Smoker    Packs/day: 0.50    Types: Cigarettes, E-cigarettes  . Smokeless tobacco: Never Used  . Tobacco comment: nicotine patch  Substance and Sexual Activity  . Alcohol use: Not Currently  . Drug use: Not Currently    Types: Marijuana    Comment: Not since 02/13  . Sexual activity: Yes    Birth control/protection: None  Other Topics Concern  . Not on file  Social History Narrative  . Not on file   Social Determinants of Health   Financial Resource Strain:   . Difficulty of Paying Living Expenses: Not on file  Food Insecurity:   .  Worried About Charity fundraiser in the Last Year: Not on file  . Ran Out of Food in the Last Year: Not on file  Transportation Needs:   . Lack of Transportation (Medical): Not on file  . Lack of Transportation (Non-Medical): Not on file  Physical Activity:   . Days of Exercise per Week: Not on file  . Minutes of Exercise per Session: Not on file  Stress:   . Feeling of Stress : Not on file  Social Connections:   . Frequency of Communication with Friends and Family: Not on file  . Frequency of Social Gatherings with Friends and Family: Not on file  . Attends Religious Services: Not on file  . Active Member of Clubs or Organizations: Not on file  . Attends Archivist Meetings: Not on file  . Marital Status: Not on file  Intimate Partner Violence:   . Fear of Current or Ex-Partner: Not on file  . Emotionally Abused: Not on file  . Physically Abused: Not on file  . Sexually Abused: Not on file   Family History  Problem Relation Age of Onset  . Diabetes Mother   . Diabetes Sister   . Breast cancer Maternal Grandmother   . Diabetes Maternal Grandmother   . Glaucoma Maternal Grandmother   . Other Son        born with hole in heart  . Other Daughter        born with hole in heart    OBJECTIVE:  Vitals:   11/28/19 0841  BP: 115/79  Pulse: (!) 106  Resp: 18  Temp: 98.6 F (37 C)  TempSrc: Oral  SpO2: 96%     General appearance: alert; appears fatigued, but nontoxic; speaking in full sentences and tolerating own secretions HEENT: NCAT; Ears: EACs clear, TMs pearly gray; Eyes: PERRL.  EOM grossly intact. Sinuses: TTP over maxillary sinuses; Nose: nares patent with clear rhinorrhea, Throat: oropharynx clear, tonsils non erythematous or enlarged, uvula midline  Neck: supple without LAD Lungs: unlabored respirations, symmetrical air entry; cough: mild; no respiratory distress; CTAB Heart: regular rate and rhythm.   Abdomen: Pregnant Skin: warm and dry  Psychological: alert and cooperative; normal mood and affect  ASSESSMENT & PLAN:  1. Acute non-recurrent pansinusitis     Meds ordered this encounter  Medications  . azithromycin (ZITHROMAX) 250 MG tablet    Sig: Take 1 tablet (250 mg total) by mouth daily. Take first 2 tablets together, then 1 every day until finished.    Dispense:  6 tablet    Refill:  0    Order Specific Question:   Supervising Provider    Answer:   Raylene Everts [[id number]]    Get plenty of rest and push fluids Based on symptoms I will treat for possible sinus infection Z-pak prescribed.  Take as directed and to completion I have attached a list of medications safe in pregnancy that you may use as needed for symptomatic relief Follow up with PCP or OB/GYN in a few days for recheck to ensure symptoms are improving Call or go to the ED if you have any new or worsening symptoms such as fever, worsening cough, shortness of breath, chest tightness, chest pain, turning blue, changes in mental status, etc...   Reviewed expectations re: course of current medical issues. Questions answered. Outlined signs and symptoms indicating need for more acute intervention. Patient verbalized understanding. After Visit Summary given.         Rennis Harding, PA-C 11/28/19 [phone]

## 2019-11-28 NOTE — Discharge Instructions (Signed)
Get plenty of rest and push fluids Based on symptoms I will treat for possible sinus infection Z-pak prescribed.  Take as directed and to completion I have attached a list of medications safe in pregnancy that you may use as needed for symptomatic relief Follow up with PCP or OB/GYN in a few days for recheck to ensure symptoms are improving Call or go to the ED if you have any new or worsening symptoms such as fever, worsening cough, shortness of breath, chest tightness, chest pain, turning blue, changes in mental status, etc..Marland Kitchen

## 2019-12-04 ENCOUNTER — Ambulatory Visit
Admission: EM | Admit: 2019-12-04 | Discharge: 2019-12-04 | Disposition: A | Discharge status: Home / Self Care | Payer: Medicaid Other | Attending: Emergency Medicine | Admitting: Emergency Medicine

## 2019-12-04 ENCOUNTER — Other Ambulatory Visit: Payer: Self-pay

## 2019-12-04 DIAGNOSIS — Z20822 Contact with and (suspected) exposure to covid-19: Secondary | ICD-10-CM | POA: Diagnosis not present

## 2019-12-04 NOTE — ED Triage Notes (Signed)
Pt returns with continued cough, wants covid testing

## 2019-12-04 NOTE — ED Provider Notes (Signed)
[facility]    CSN: [id number] Arrival date & time: 12/04/19  1234      History   Chief Complaint Chief Complaint  Patient presents with  . Cough    HPI Erin Krueger is a 24 y.o. female.   Erin Krueger 24 years old female presented to the urgent care for complaint of of cough and congestion for the past 10 days.  Was seen previously and was treated with azithromycin.  Symptoms have not resolved.  Denies sick exposure to COVID, flu or strep.  Denies recent travel.  Denies aggravating or alleviating symptoms.  Denies previous COVID infection.   Denies fever, chills, fatigue, nasal congestion, rhinorrhea, sore throat,  SOB, wheezing, chest pain, nausea, vomiting, changes in bowel or bladder habits.    The history is provided by the patient. No language interpreter was used.    Past Medical History:  Diagnosis Date  . ADHD (attention deficit hyperactivity disorder)   . Allergy   . Anxiety   . Asthma   . Eating disorder   . Headache(784.0)   . Kidney stone    kidney stones  . Migraines   . ODD (oppositional defiant disorder)   . PID (acute pelvic inflammatory disease) 01/29/2016  . PTSD (post-traumatic stress disorder)   . Seizures [facility])    diagnosed age 52    Patient Active Problem List   Diagnosis Date Noted  . Obesity affecting pregnancy in third trimester 11/07/2019  . Adopted person 10/04/2019  . Supervision of high risk pregnancy, antepartum 07/19/2019  . Trichomonal vaginitis during pregnancy 06/09/2019  . Status epilepticus ([place]) 11/29/2018  . Asthma 11/29/2018  . Recurrent UTI (urinary tract infection) complicating pregnancy 67/89/3810  . Marijuana use 04/07/2018  . Smoker 04/22/2018  . Death of child 04-22-18  . 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 Bipolar 1 disorder ([place]) 05/09/2016  . Breakthrough seizure ([place]) 01/07/2016  . Seizure ([place]) 01/07/2016  . 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 PTSD (post-traumatic stress disorder)  05/12/2013  . 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 Polysubstance abuse ([place]) 05/12/2013    Past Surgical History:  Procedure Laterality Date  . right knee surgery    . TONSILLECTOMY AND ADENOIDECTOMY      OB History    Gravida  4   Para  2   Term  2   Preterm      AB  1   Living  1     SAB  1   TAB      Ectopic      Multiple  0   Live Births  2            Home Medications    Prior to Admission medications   Medication Sig Start Date End Date Taking? Authorizing Provider  acetaminophen (TYLENOL) 500 MG tablet Take 500 mg by mouth every 6 (six) hours as needed for mild pain or moderate pain.    [provider]  azithromycin (ZITHROMAX) 250 MG tablet Take 1 tablet (250 mg total) by mouth daily. Take first 2 tablets together, then 1 every day until finished. 11/28/19   [place], [place], PA-C  levETIRAcetam (KEPPRA) 750 MG tablet Take 750 mg by mouth 2 (two) times daily.    [provider]  Prenatal MV-Min-FA-Omega-3 (PRENATAL GUMMIES/DHA & FA) 0.4-32.5 MG CHEW Chew by mouth.    [provider]  albuterol (PROVENTIL HFA;VENTOLIN HFA) 108 (90 Base) MCG/ACT inhaler Inhale 2 puffs into the lungs every  6 (six) hours as needed for wheezing or shortness of breath. Patient not taking: Reported on 11/22/2019 04/05/18 11/28/19  Cheral Marker, CNM  metoCLOPramide (REGLAN) 10 MG tablet Take 1 tablet (10 mg total) by mouth every 6 (six) hours as needed for nausea. 07/19/19 11/28/19  Jacklyn Shell, CNM    Family History Family History  Problem Relation Age of Onset  . Diabetes Mother   . Diabetes Sister   . Breast cancer Maternal Grandmother   . Diabetes Maternal Grandmother   . Glaucoma Maternal Grandmother   . Other Son        born with hole in heart  . Other Daughter        born with hole in heart    Social History Social History   Tobacco Use  . Smoking status: Current Some Day Smoker     Packs/day: 0.50    Types: Cigarettes, E-cigarettes  . Smokeless tobacco: Never Used  . Tobacco comment: nicotine patch  Substance Use Topics  . Alcohol use: Not Currently  . Drug use: Not Currently    Types: Marijuana    Comment: Not since 02/13     Allergies   Bee venom, Cashew nut oil, Penicillins, Ativan [lorazepam], Lodine [etodolac], Prednisone, Cocoa butter, and Latex   Review of Systems Review of Systems  Constitutional: Negative.   HENT: Positive for congestion.   Respiratory: Positive for cough.   Cardiovascular: Negative.   Gastrointestinal: Negative.   Neurological: Negative.      Physical Exam Triage Vital Signs ED Triage Vitals [12/04/19 1246]  Enc Vitals Group     BP 113/80     Pulse Rate (!) 110     Resp 18     Temp 98.6 F (37 C)     Temp Source Oral     SpO2 98 %     Weight      Height      Head Circumference      Peak Flow      Pain Score      Pain Loc      Pain Edu?      Excl. in GC?    No data found.  Updated Vital Signs BP 113/80 (BP Location: Right Arm)   Pulse (!) 110   Temp 98.6 F (37 C) (Oral)   Resp 18   LMP 04/12/2019 (Approximate)   SpO2 98%   Visual Acuity Right Eye Distance:   Left Eye Distance:   Bilateral Distance:    Right Eye Near:   Left Eye Near:    Bilateral Near:     Physical Exam Vitals and nursing note reviewed.  Constitutional:      General: She is not in acute distress.    Appearance: Normal appearance. She is normal weight. She is not ill-appearing or toxic-appearing.  HENT:     Head: Normocephalic.     Right Ear: Tympanic membrane, ear canal and external ear normal. There is no impacted cerumen.     Left Ear: Tympanic membrane, ear canal and external ear normal. There is no impacted cerumen.     Nose: Nose normal. No congestion.     Mouth/Throat:     Mouth: Mucous membranes are moist.     Pharynx: No oropharyngeal exudate or posterior oropharyngeal erythema.  Cardiovascular:     Rate and  Rhythm: Normal rate and regular rhythm.     Pulses: Normal pulses.     Heart sounds: Normal heart sounds. No murmur.  Pulmonary:     Effort: Pulmonary effort is normal. No respiratory distress.     Breath sounds: Normal breath sounds. No wheezing or rhonchi.  Chest:     Chest wall: No tenderness.  Abdominal:     General: Abdomen is flat. Bowel sounds are normal. There is no distension.     Palpations: There is no mass.  Skin:    Capillary Refill: Capillary refill takes less than 2 seconds.  Neurological:     Mental Status: She is alert and oriented to person, place, and time.      UC Treatments / Results  Labs (all labs ordered are listed, but only abnormal results are displayed) Labs Reviewed  NOVEL CORONAVIRUS, NAA    EKG   Radiology No results found.  Procedures Procedures (including critical care time)  Medications Ordered in UC Medications - No data to display  Initial Impression / Assessment and Plan / UC Course  I have reviewed the triage vital signs and the nursing notes.  Pertinent labs & imaging results that were available during my care of the patient were reviewed by me and considered in my medical decision making (see chart for details).     COVID-19 test was ordered.  Patient is stable for discharge and in no acute distress.  Advised patient to quarantine until COVID-19 test result become available.  Patient verbalized understanding of the plan of care  Final Clinical Impressions(s) / UC Diagnoses   Final diagnoses:  Suspected COVID-19 virus infection     Discharge Instructions     COVID testing ordered.  It will take between 2-7 days for test results.  Someone will contact you regarding abnormal results.    In the meantime: You should remain isolated in your home for 10 days from symptom onset AND greater than 72 hours after symptoms resolution (absence of fever without the use of fever-reducing medication and improvement in respiratory  symptoms), whichever is longer Get plenty of rest and push fluids Flonase prescribed for nasal congestion and runny nose Use medications daily for symptom relief Use OTC medications like ibuprofen or tylenol as needed fever or pain Call or go to the ED if you have any new or worsening symptoms such as fever, worsening cough, shortness of breath, chest tightness, chest pain, turning blue, changes in mental status, etc...     ED Prescriptions    None     PDMP not reviewed this encounter.   Durward Parcel, FNP 12/04/19 1308

## 2019-12-04 NOTE — Discharge Instructions (Signed)
COVID testing ordered.  It will take between 2-7 days for test results.  Someone will contact you regarding abnormal results.    In the meantime: You should remain isolated in your home for 10 days from symptom onset AND greater than 72 hours after symptoms resolution (absence of fever without the use of fever-reducing medication and improvement in respiratory symptoms), whichever is longer Get plenty of rest and push fluids Flonase prescribed for nasal congestion and runny nose Use medications daily for symptom relief Use OTC medications like ibuprofen or tylenol as needed fever or pain Call or go to the ED if you have any new or worsening symptoms such as fever, worsening cough, shortness of breath, chest tightness, chest pain, turning blue, changes in mental status, etc...  

## 2019-12-05 ENCOUNTER — Other Ambulatory Visit: Payer: Self-pay

## 2019-12-05 ENCOUNTER — Inpatient Hospital Stay (HOSPITAL_COMMUNITY)
Admission: AD | Admit: 2019-12-05 | Discharge: 2019-12-05 | Disposition: A | Discharge status: Home / Self Care | Payer: Medicaid Other | Attending: Family Medicine | Admitting: Family Medicine

## 2019-12-05 ENCOUNTER — Encounter (HOSPITAL_COMMUNITY): Payer: Self-pay | Admitting: Family Medicine

## 2019-12-05 DIAGNOSIS — Z87891 Personal history of nicotine dependence: Secondary | ICD-10-CM | POA: Diagnosis not present

## 2019-12-05 DIAGNOSIS — Z3A32 32 weeks gestation of pregnancy: Secondary | ICD-10-CM | POA: Diagnosis not present

## 2019-12-05 DIAGNOSIS — J069 Acute upper respiratory infection, unspecified: Secondary | ICD-10-CM | POA: Diagnosis not present

## 2019-12-05 DIAGNOSIS — M549 Dorsalgia, unspecified: Secondary | ICD-10-CM | POA: Insufficient documentation

## 2019-12-05 DIAGNOSIS — O26893 Other specified pregnancy related conditions, third trimester: Secondary | ICD-10-CM

## 2019-12-05 DIAGNOSIS — O368190 Decreased fetal movements, unspecified trimester, not applicable or unspecified: Secondary | ICD-10-CM | POA: Diagnosis not present

## 2019-12-05 DIAGNOSIS — Z880 Allergy status to penicillin: Secondary | ICD-10-CM | POA: Insufficient documentation

## 2019-12-05 DIAGNOSIS — Z20822 Contact with and (suspected) exposure to covid-19: Secondary | ICD-10-CM | POA: Insufficient documentation

## 2019-12-05 DIAGNOSIS — M5431 Sciatica, right side: Secondary | ICD-10-CM | POA: Diagnosis not present

## 2019-12-05 DIAGNOSIS — O99513 Diseases of the respiratory system complicating pregnancy, third trimester: Secondary | ICD-10-CM | POA: Insufficient documentation

## 2019-12-05 LAB — NOVEL CORONAVIRUS, NAA: SARS-CoV-2, NAA: NOT DETECTED

## 2019-12-05 MED ORDER — GUAIFENESIN ER 600 MG PO TB12: 600.0000 mg | ORAL_TABLET | Freq: Two times a day (BID) | ORAL | 1 refills | Status: DC

## 2019-12-05 NOTE — Discharge Instructions (Signed)
Upper Respiratory Infection, Adult An upper respiratory infection (URI) is a common viral infection of the nose, throat, and upper air passages that lead to the lungs. The most common type of URI is the common cold. URIs usually get better on their own, without medical treatment. What are the causes? A URI is caused by a virus. You may catch a virus by:  Breathing in droplets from an infected person's cough or sneeze.  Touching something that has been exposed to the virus (contaminated) and then touching your mouth, nose, or eyes. What increases the risk? You are more likely to get a URI if:  You are very young or very old.  It is autumn or winter.  You have close contact with others, such as at a daycare, school, or health care facility.  You smoke.  You have long-term (chronic) heart or lung disease.  You have a weakened disease-fighting (immune) system.  You have nasal allergies or asthma.  You are experiencing a lot of stress.  You work in an area that has poor air circulation.  You have poor nutrition. What are the signs or symptoms? A URI usually involves some of the following symptoms:  Runny or stuffy (congested) nose.  Sneezing.  Cough.  Sore throat.  Headache.  Fatigue.  Fever.  Loss of appetite.  Pain in your forehead, behind your eyes, and over your cheekbones (sinus pain).  Muscle aches.  Redness or irritation of the eyes.  Pressure in the ears or face. How is this diagnosed? This condition may be diagnosed based on your medical history and symptoms, and a physical exam. Your health care provider may use a cotton swab to take a mucus sample from your nose (nasal swab). This sample can be tested to determine what virus is causing the illness. How is this treated? URIs usually get better on their own within 7-10 days. You can take steps at home to relieve your symptoms. Medicines cannot cure URIs, but your health care provider may recommend  certain medicines to help relieve symptoms, such as:  Over-the-counter cold medicines.  Cough suppressants. Coughing is a type of defense against infection that helps to clear the respiratory system, so take these medicines only as recommended by your health care provider.  Fever-reducing medicines. Follow these instructions at home: Activity  Rest as needed.  If you have a fever, stay home from work or school until your fever is gone or until your health care provider says you are no longer contagious. Your health care provider may have you wear a face mask to prevent your infection from spreading. Relieving symptoms  Gargle with a salt-water mixture 3-4 times a day or as needed. To make a salt-water mixture, completely dissolve -1 tsp of salt in 1 cup of warm water.  Use a cool-mist humidifier to add moisture to the air. This can help you breathe more easily. Eating and drinking   Drink enough fluid to keep your urine pale yellow.  Eat soups and other clear broths. General instructions   Take over-the-counter and prescription medicines only as told by your health care provider. These include cold medicines, fever reducers, and cough suppressants.  Do not use any products that contain nicotine or tobacco, such as cigarettes and e-cigarettes. If you need help quitting, ask your health care provider.  Stay away from secondhand smoke.  Stay up to date on all immunizations, including the yearly (annual) flu vaccine.  Keep all follow-up visits as told by your health   care provider. This is important. How to prevent the spread of infection to others   URIs can be passed from person to person (are contagious). To prevent the infection from spreading: ? Wash your hands often with soap and water. If soap and water are not available, use hand sanitizer. ? Avoid touching your mouth, face, eyes, or nose. ? Cough or sneeze into a tissue or your sleeve or elbow instead of into your hand  or into the air. Contact a health care provider if:  You are getting worse instead of better.  You have a fever or chills.  Your mucus is brown or red.  You have yellow or brown discharge coming from your nose.  You have pain in your face, especially when you bend forward.  You have swollen neck glands.  You have pain while swallowing.  You have white areas in the back of your throat. Get help right away if:  You have shortness of breath that gets worse.  You have severe or persistent: ? Headache. ? Ear pain. ? Sinus pain. ? Chest pain.  You have chronic lung disease along with any of the following: ? Wheezing. ? Prolonged cough. ? Coughing up blood. ? A change in your usual mucus.  You have a stiff neck.  You have changes in your: ? Vision. ? Hearing. ? Thinking. ? Mood. Summary  An upper respiratory infection (URI) is a common infection of the nose, throat, and upper air passages that lead to the lungs.  A URI is caused by a virus.  URIs usually get better on their own within 7-10 days.  Medicines cannot cure URIs, but your health care provider may recommend certain medicines to help relieve symptoms. This information is not intended to replace advice given to you by your health care provider. Make sure you discuss any questions you have with your health care provider. Document Revised: 11/17/2018 Document Reviewed: 06/25/2017 Elsevier Patient Education  2020 [employer]   Sciatica  Sciatica is pain, numbness, weakness, or tingling along the path of the sciatic nerve. The sciatic nerve starts in the lower back and runs down the back of each leg. The nerve controls the muscles in the lower leg and in the back of the knee. It also provides feeling (sensation) to the back of the thigh, the lower leg, and the sole of the foot. Sciatica is a symptom of another medical condition that pinches or puts pressure on the sciatic nerve. Sciatica most often only  affects one side of the body. Sciatica usually goes away on its own or with treatment. In some cases, sciatica may come back (recur). What are the causes? This condition is caused by pressure on the sciatic nerve or pinching of the nerve. This may be the result of:  A disk in between the bones of the spine bulging out too far (herniated disk).  Age-related changes in the spinal disks.  A pain disorder that affects a muscle in the buttock.  Extra bone growth near the sciatic nerve.  A break (fracture) of the pelvis.  Pregnancy.  Tumor. This is rare. What increases the risk? The following factors may make you more likely to develop this condition:  Playing sports that place pressure or stress on the spine.  Having poor strength and flexibility.  A history of back injury or surgery.  Sitting for long periods of time.  Doing activities that involve repetitive bending or lifting.  Obesity. What are the signs or symptoms?  Symptoms can vary from mild to very severe, and they may include:  Any of these problems in the lower back, leg, hip, or buttock: ? Mild tingling, numbness, or dull aches. ? Burning sensations. ? Sharp pains.  Numbness in the back of the calf or the sole of the foot.  Leg weakness.  Severe back pain that makes movement difficult. Symptoms may get worse when you cough, sneeze, or laugh, or when you sit or stand for long periods of time. How is this diagnosed? This condition may be diagnosed based on:  Your symptoms and medical history.  A physical exam.  Blood tests.  Imaging tests, such as: ? X-rays. ? MRI. ? CT scan. How is this treated? In many cases, this condition improves on its own without treatment. However, treatment may include:  Reducing or modifying physical activity.  Exercising and stretching.  Icing and applying heat to the affected area.  Medicines that help to: ? Relieve pain and swelling. ? Relax your  muscles.  Injections of medicines that help to relieve pain, irritation, and inflammation around the sciatic nerve (steroids).  Surgery. Follow these instructions at home: Medicines  Take over-the-counter and prescription medicines only as told by your health care provider.  Ask your health care provider if the medicine prescribed to you: ? Requires you to avoid driving or using heavy machinery. ? Can cause constipation. You may need to take these actions to prevent or treat constipation:  Drink enough fluid to keep your urine pale yellow.  Take over-the-counter or prescription medicines.  Eat foods that are high in fiber, such as beans, whole grains, and fresh fruits and vegetables.  Limit foods that are high in fat and processed sugars, such as fried or sweet foods. Managing pain      If directed, put ice on the affected area. ? Put ice in a plastic bag. ? Place a towel between your skin and the bag. ? Leave the ice on for 20 minutes, 2-3 times a day.  If directed, apply heat to the affected area. Use the heat source that your health care provider recommends, such as a moist heat pack or a heating pad. ? Place a towel between your skin and the heat source. ? Leave the heat on for 20-30 minutes. ? Remove the heat if your skin turns bright red. This is especially important if you are unable to feel pain, heat, or cold. You may have a greater risk of getting burned. Activity   Return to your normal activities as told by your health care provider. Ask your health care provider what activities are safe for you.  Avoid activities that make your symptoms worse.  Take brief periods of rest throughout the day. ? When you rest for longer periods, mix in some mild activity or stretching between periods of rest. This will help to prevent stiffness and pain. ? Avoid sitting for long periods of time without moving. Get up and move around at least one time each hour.  Exercise and  stretch regularly, as told by your health care provider.  Do not lift anything that is heavier than 10 lb (4.5 kg) while you have symptoms of sciatica. When you do not have symptoms, you should still avoid heavy lifting, especially repetitive heavy lifting.  When you lift objects, always use proper lifting technique, which includes: ? Bending your knees. ? Keeping the load close to your body. ? Avoiding twisting. General instructions  Maintain a healthy weight. Excess weight  puts extra stress on your back.  Wear supportive, comfortable shoes. Avoid wearing high heels.  Avoid sleeping on a mattress that is too soft or too hard. A mattress that is firm enough to support your back when you sleep may help to reduce your pain.  Keep all follow-up visits as told by your health care provider. This is important. Contact a health care provider if:  You have pain that: ? Wakes you up when you are sleeping. ? Gets worse when you lie down. ? Is worse than you have experienced in the past. ? Lasts longer than 4 weeks.  You have an unexplained weight loss. Get help right away if:  You are not able to control when you urinate or have bowel movements (incontinence).  You have: ? Weakness in your lower back, pelvis, buttocks, or legs that gets worse. ? Redness or swelling of your back. ? A burning sensation when you urinate. Summary  Sciatica is pain, numbness, weakness, or tingling along the path of the sciatic nerve.  This condition is caused by pressure on the sciatic nerve or pinching of the nerve.  Sciatica can cause pain, numbness, or tingling in the lower back, legs, hips, and buttocks.  Treatment often includes rest, exercise, medicines, and applying ice or heat. This information is not intended to replace advice given to you by your health care provider. Make sure you discuss any questions you have with your health care provider. Document Revised: 11/28/2018 Document Reviewed:  11/28/2018 Elsevier Patient Education  [place].

## 2019-12-05 NOTE — MAU Note (Signed)
PT SAYS SHE  HAS HAD CONSTANT PAIN  X3 DAYS IN BACK- RADIATING  TO RIGHT  SIDE- HAS USED HEATING PAD , ICE, TYLENOL - NO RELIEF . HAS AN APPOINTMENT - VIRTUAL - TOMORROW.   CALLED  OFFICE  TODAY - TOLD TO COME HERE. LAST MOVEMENT WAS IN WAITING ROOM.  HAS INCREASE IN VAG D/C- NO ODOR- MILKY WHITE- STARTED YESTERDAY .

## 2019-12-05 NOTE — MAU Provider Note (Signed)
Chief Complaint:  No chief complaint on file.   First Provider Initiated Contact with Patient 12/05/19 2220     HPI: Erin Krueger is a 24 y.o. [phone] at 67w2dwho presents to maternity admissions reporting decreased fetal movement and sharp pains over right sciatic joint.  Pain is worse with turning in bed and walking.  . She denies LOF, vaginal bleeding, vaginal itching/burning, urinary symptoms, h/a, dizziness, n/v, diarrhea, constipation or fever/chills.  She denies headache, visual changes or RUQ abdominal pain.  Back Pain This is a recurrent problem. The current episode started 1 to 4 weeks ago. The problem occurs intermittently. The problem is unchanged. The pain is present in the sacro-iliac. The pain does not radiate. The pain is moderate. The pain is the same all the time. The symptoms are aggravated by bending, position, standing and twisting. Stiffness is present all day. Pertinent negatives include no abdominal pain, chest pain, dysuria, fever, headaches, leg pain, numbness, paresthesias, pelvic pain or weakness. She has tried nothing for the symptoms.    RN note: PT SAYS SHE  HAS HAD CONSTANT PAIN  X3 DAYS IN BACK- RADIATING  TO RIGHT  SIDE- HAS USED HEATING PAD , ICE, TYLENOL - NO RELIEF . HAS AN APPOINTMENT - VIRTUAL - TOMORROW.   CALLED  OFFICE  TODAY - TOLD TO COME HERE. LAST MOVEMENT WAS IN WAITING ROOM.  HAS INCREASE IN VAG D/C- NO ODOR- MILKY WHITE- STARTED YESTERDAY    Past Medical History: Past Medical History:  Diagnosis Date  . ADHD (attention deficit hyperactivity disorder)   . Allergy   . Anxiety   . Asthma   . Eating disorder   . Headache(784.0)   . Kidney stone    kidney stones  . Migraines   . ODD (oppositional defiant disorder)   . PID (acute pelvic inflammatory disease) 01/29/2016  . PTSD (post-traumatic stress disorder)   . Seizures (HCC)    diagnosed age 62    Past obstetric history: OB History  Gravida Para Term Preterm AB Living  4 2 2   1 1    SAB TAB Ectopic Multiple Live Births  1     0 2    # Outcome Date GA Lbr Len/2nd Weight Sex Delivery Anes PTL Lv  4 Current           3 Term 11/08/18 [redacted]w[redacted]d 00:15 / 00:18 3275 g M Vag-Spont EPI  LIV     Birth Comments: WNL  2 Term 06/23/17 [redacted]w[redacted]d  2948 g F Vag-Spont EPI N DEC     Birth Comments: cardiac defect  1 SAB             Past Surgical History: Past Surgical History:  Procedure Laterality Date  . right knee surgery    . TONSILLECTOMY AND ADENOIDECTOMY      Family History: Family History  Problem Relation Age of Onset  . Diabetes Mother   . Diabetes Sister   . Breast cancer Maternal Grandmother   . Diabetes Maternal Grandmother   . Glaucoma Maternal Grandmother   . Other Son        born with hole in heart  . Other Daughter        born with hole in heart    Social History: Social History   Tobacco Use  . Smoking status: Former Smoker    Packs/day: 0.50    Types: Cigarettes, E-cigarettes  . Smokeless tobacco: Never Used  . Tobacco comment: nicotine patch  Substance Use Topics  .  Alcohol use: Not Currently  . Drug use: Not Currently    Types: Marijuana    Comment: Not since 02/13    Allergies:  Allergies  Allergen Reactions  . Bee Venom Anaphylaxis  . Cashew Nut Oil Anaphylaxis  . Penicillins Anaphylaxis    Has patient had a PCN reaction causing immediate rash, facial/tongue/throat swelling, SOB or lightheadedness with hypotension: Unknown Has patient had a PCN reaction causing severe rash involving mucus membranes or skin necrosis: Unknown Has patient had a PCN reaction that required hospitalization: Unknown Has patient had a PCN reaction occurring within the last 10 years: Unknown If all of the above answers are "NO", then may proceed with Cephalosporin use.   . Ativan [Lorazepam]     Aggressive-agitation  . Lodine [Etodolac] Nausea And Vomiting and Other (See Comments)    Patient states had vomiting when taking medication, and threw up blood; also  complained of severe headaches while on medication  . Prednisone Other (See Comments)    Bleeding nose bleeding and internal bleeding  . Cocoa Butter Rash  . Latex Rash    Meds:  Medications Prior to Admission  Medication Sig Dispense Refill Last Dose  . acetaminophen (TYLENOL) 500 MG tablet Take 500 mg by mouth every 6 (six) hours as needed for mild pain or moderate pain.   12/05/2019 at Unknown time  . azithromycin (ZITHROMAX) 250 MG tablet Take 1 tablet (250 mg total) by mouth daily. Take first 2 tablets together, then 1 every day until finished. 6 tablet 0 Past Week at Unknown time  . levETIRAcetam (KEPPRA) 750 MG tablet Take 750 mg by mouth 2 (two) times daily.   12/05/2019 at Unknown time  . Prenatal MV-Min-FA-Omega-3 (PRENATAL GUMMIES/DHA & FA) 0.4-32.5 MG CHEW Chew by mouth.   12/05/2019 at Unknown time    I have reviewed patient's Past Medical Hx, Surgical Hx, Family Hx, Social Hx, medications and allergies.   ROS:  Review of Systems  Constitutional: Negative for fever.  Cardiovascular: Negative for chest pain.  Gastrointestinal: Negative for abdominal pain.  Genitourinary: Negative for dysuria and pelvic pain.  Musculoskeletal: Positive for back pain.  Neurological: Negative for weakness, numbness, headaches and paresthesias.   Other systems negative  Physical Exam   Patient Vitals for the past 24 hrs:  BP Temp Pulse Resp Height Weight  12/05/19 2140 118/77 98.4 F (36.9 C) (!) 115 20 5\' 7"  (1.702 m) 96.7 kg   Constitutional: Well-developed, well-nourished female in no acute distress.  Cardiovascular: normal rate and rhythm Respiratory: normal effort, clear to auscultation bilaterally GI: Abd soft, non-tender, gravid appropriate for gestational age.   No rebound or guarding. MS: Extremities nontender, no edema, normal ROM  Pain is localized to right SI joint Neurologic: Alert and oriented x 4.  GU: Neg CVAT.  PELVIC EXAM: deferred. No contractions   FHT:  Baseline  140 , moderate variability, accelerations present, no decelerations Contractions:  Rare   Labs: No results found for this or any previous visit (from the past 24 hour(s)).  O/Positive/-- (08/26 1556)  Imaging:  No results found.  MAU Course/MDM: NST reviewed, reassuring Discussed comfort measures for SI management at home.  Piriformis stretch taught with return demo Feels fetal movement now.  Has been seen twice in ER for URI.  Covid test Negative.  I will send Rx for Mucinex to pharmacy.   Treatments in MAU included none.    Assessment: Pregnancy at 103w3d URI, negative Covid SI joint dysfunction/pain Decreased fetal movement,  resolved, reactive Nonstress test  Plan: Discharge home Has flexeril at home, use prn, piriformis stretches bid, ask office about PT referral Rx Mucinex sent for URI Preterm Labor precautions and fetal kick counts Follow up in Office for prenatal visits   Encouraged to return here or to other Urgent Care/ED if she develops worsening of symptoms, increase in pain, fever, or other concerning symptoms.   Pt stable at time of discharge.  Hansel Feinstein CNM, MSN Certified Nurse-Midwife 12/05/2019 10:20 PM

## 2019-12-06 ENCOUNTER — Telehealth: Payer: Medicaid Other | Admitting: Advanced Practice Midwife

## 2019-12-06 ENCOUNTER — Encounter: Payer: Medicaid Other | Admitting: Advanced Practice Midwife

## 2019-12-21 ENCOUNTER — Telehealth: Payer: Self-pay | Admitting: *Deleted

## 2019-12-21 NOTE — Telephone Encounter (Signed)
Pt reports that she has pain when baby moves sometimes in her vaginal area and in her navel area. She reports good fetal movement and denies bleeding or leaking of fluid. Advised that this sounded like normal pain at this point in pregnancy. Pt scheduled for her next pnv for next Tuesday. No other questions or concerns at this time.

## 2019-12-26 ENCOUNTER — Encounter: Payer: Medicaid Other | Admitting: Advanced Practice Midwife

## 2019-12-27 ENCOUNTER — Encounter (HOSPITAL_COMMUNITY): Payer: Self-pay | Admitting: Obstetrics and Gynecology

## 2019-12-27 ENCOUNTER — Ambulatory Visit (INDEPENDENT_AMBULATORY_CARE_PROVIDER_SITE_OTHER): Payer: Medicaid Other | Admitting: Advanced Practice Midwife

## 2019-12-27 ENCOUNTER — Other Ambulatory Visit: Payer: Self-pay

## 2019-12-27 ENCOUNTER — Inpatient Hospital Stay (HOSPITAL_COMMUNITY)
Admission: AD | Admit: 2019-12-27 | Discharge: 2019-12-29 | DRG: 806 | Discharge status: Against Medical Advice | Payer: Medicaid Other | Attending: Obstetrics and Gynecology | Admitting: Obstetrics and Gynecology

## 2019-12-27 VITALS — BP 137/93 | HR 108 | Wt 221.0 lb

## 2019-12-27 DIAGNOSIS — Z362 Encounter for other antenatal screening follow-up: Secondary | ICD-10-CM | POA: Diagnosis not present

## 2019-12-27 DIAGNOSIS — O09299 Supervision of pregnancy with other poor reproductive or obstetric history, unspecified trimester: Secondary | ICD-10-CM | POA: Diagnosis present

## 2019-12-27 DIAGNOSIS — O99214 Obesity complicating childbirth: Secondary | ICD-10-CM | POA: Diagnosis present

## 2019-12-27 DIAGNOSIS — O1410 Severe pre-eclampsia, unspecified trimester: Secondary | ICD-10-CM | POA: Diagnosis present

## 2019-12-27 DIAGNOSIS — Z3A35 35 weeks gestation of pregnancy: Secondary | ICD-10-CM

## 2019-12-27 DIAGNOSIS — O99344 Other mental disorders complicating childbirth: Secondary | ICD-10-CM | POA: Diagnosis present

## 2019-12-27 DIAGNOSIS — Z331 Pregnant state, incidental: Secondary | ICD-10-CM

## 2019-12-27 DIAGNOSIS — Z880 Allergy status to penicillin: Secondary | ICD-10-CM

## 2019-12-27 DIAGNOSIS — A5901 Trichomonal vulvovaginitis: Secondary | ICD-10-CM

## 2019-12-27 DIAGNOSIS — G40909 Epilepsy, unspecified, not intractable, without status epilepticus: Secondary | ICD-10-CM | POA: Diagnosis present

## 2019-12-27 DIAGNOSIS — O99354 Diseases of the nervous system complicating childbirth: Secondary | ICD-10-CM | POA: Diagnosis present

## 2019-12-27 DIAGNOSIS — F4310 Post-traumatic stress disorder, unspecified: Secondary | ICD-10-CM | POA: Diagnosis present

## 2019-12-27 DIAGNOSIS — O0990 Supervision of high risk pregnancy, unspecified, unspecified trimester: Secondary | ICD-10-CM

## 2019-12-27 DIAGNOSIS — Z20822 Contact with and (suspected) exposure to covid-19: Secondary | ICD-10-CM | POA: Diagnosis present

## 2019-12-27 DIAGNOSIS — Z87891 Personal history of nicotine dependence: Secondary | ICD-10-CM | POA: Diagnosis not present

## 2019-12-27 DIAGNOSIS — Z87440 Personal history of urinary (tract) infections: Secondary | ICD-10-CM

## 2019-12-27 DIAGNOSIS — O163 Unspecified maternal hypertension, third trimester: Secondary | ICD-10-CM

## 2019-12-27 DIAGNOSIS — O1414 Severe pre-eclampsia complicating childbirth: Principal | ICD-10-CM | POA: Diagnosis present

## 2019-12-27 DIAGNOSIS — O1413 Severe pre-eclampsia, third trimester: Secondary | ICD-10-CM

## 2019-12-27 DIAGNOSIS — Z1389 Encounter for screening for other disorder: Secondary | ICD-10-CM

## 2019-12-27 LAB — URINALYSIS, ROUTINE W REFLEX MICROSCOPIC
Bilirubin Urine: NEGATIVE
Glucose, UA: NEGATIVE mg/dL
Hgb urine dipstick: NEGATIVE
Ketones, ur: NEGATIVE mg/dL
Nitrite: NEGATIVE
Protein, ur: 30 mg/dL — AB
Specific Gravity, Urine: 1.02 (ref 1.005–1.030)
pH: 7 (ref 5.0–8.0)

## 2019-12-27 LAB — POCT URINALYSIS DIPSTICK OB
Blood, UA: NEGATIVE
Glucose, UA: NEGATIVE
Ketones, UA: NEGATIVE
Nitrite, UA: NEGATIVE
POC,PROTEIN,UA: NEGATIVE

## 2019-12-27 LAB — CBC
HCT: 35.6 % — ABNORMAL LOW (ref 36.0–46.0)
Hemoglobin: 11.8 g/dL — ABNORMAL LOW (ref 12.0–15.0)
MCH: 30.6 pg (ref 26.0–34.0)
MCHC: 33.1 g/dL (ref 30.0–36.0)
MCV: 92.2 fL (ref 80.0–100.0)
Platelets: 223 10*3/uL (ref 150–400)
RBC: 3.86 MIL/uL — ABNORMAL LOW (ref 3.87–5.11)
RDW: 12.1 % (ref 11.5–15.5)
WBC: 15.5 10*3/uL — ABNORMAL HIGH (ref 4.0–10.5)
nRBC: 0 % (ref 0.0–0.2)

## 2019-12-27 LAB — COMPREHENSIVE METABOLIC PANEL
ALT: 10 U/L (ref 0–44)
AST: 11 U/L — ABNORMAL LOW (ref 15–41)
Albumin: 2.4 g/dL — ABNORMAL LOW (ref 3.5–5.0)
Alkaline Phosphatase: 109 U/L (ref 38–126)
Anion gap: 11 (ref 5–15)
BUN: 7 mg/dL (ref 6–20)
CO2: 21 mmol/L — ABNORMAL LOW (ref 22–32)
Calcium: 9 mg/dL (ref 8.9–10.3)
Chloride: 107 mmol/L (ref 98–111)
Creatinine, Ser: 0.63 mg/dL (ref 0.44–1.00)
GFR calc Af Amer: >60 mL/min (ref >60)
GFR calc non Af Amer: >60 mL/min (ref >60)
Glucose, Bld: 95 mg/dL (ref 70–99)
Potassium: 3.5 mmol/L (ref 3.5–5.1)
Sodium: 139 mmol/L (ref 135–145)
Total Bilirubin: 0.3 mg/dL (ref 0.3–1.2)
Total Protein: 5.9 g/dL — ABNORMAL LOW (ref 6.5–8.1)

## 2019-12-27 LAB — PROTEIN / CREATININE RATIO, URINE
Creatinine, Urine: 211.47 mg/dL
Protein Creatinine Ratio: 0.17 mg/mg{Cre} — ABNORMAL HIGH (ref 0.00–0.15)
Total Protein, Urine: 36 mg/dL

## 2019-12-27 LAB — TYPE AND SCREEN
ABO/RH(D): O POS
Antibody Screen: NEGATIVE

## 2019-12-27 LAB — ABO/RH: ABO/RH(D): O POS

## 2019-12-27 MED ORDER — FLEET ENEMA 7-19 GM/118ML RE ENEM: 1.0000 | ENEMA | Freq: Every day | RECTAL | Status: DC | PRN

## 2019-12-27 MED ORDER — ONDANSETRON HCL 4 MG/2ML IJ SOLN: 4.0000 mg | Freq: Four times a day (QID) | INTRAMUSCULAR | Status: DC | PRN

## 2019-12-27 MED ORDER — OXYCODONE-ACETAMINOPHEN 5-325 MG PO TABS: 2.0000 | ORAL_TABLET | ORAL | Status: DC | PRN

## 2019-12-27 MED ORDER — CEFAZOLIN SODIUM-DEXTROSE 2-4 GM/100ML-% IV SOLN
2.0000 g | Freq: Once | INTRAVENOUS | Status: AC
  Administered 2019-12-27: 2 g via INTRAVENOUS
  Filled 2019-12-27: qty 100

## 2019-12-27 MED ORDER — BETAMETHASONE SOD PHOS & ACET 6 (3-3) MG/ML IJ SUSP
12.0000 mg | Freq: Once | INTRAMUSCULAR | Status: AC
  Administered 2019-12-27: 12 mg via INTRAMUSCULAR
  Filled 2019-12-27: qty 5

## 2019-12-27 MED ORDER — ACETAMINOPHEN 325 MG PO TABS
650.0000 mg | ORAL_TABLET | ORAL | Status: DC | PRN
  Administered 2019-12-28: 650 mg via ORAL
  Filled 2019-12-27: qty 2

## 2019-12-27 MED ORDER — MISOPROSTOL 50MCG HALF TABLET
ORAL_TABLET | ORAL | Status: AC
  Filled 2019-12-27: qty 1

## 2019-12-27 MED ORDER — LACTATED RINGERS IV SOLN: 500.0000 mL | INTRAVENOUS | Status: DC | PRN

## 2019-12-27 MED ORDER — LIDOCAINE HCL (PF) 1 % IJ SOLN: 30.0000 mL | INTRAMUSCULAR | Status: DC | PRN

## 2019-12-27 MED ORDER — OXYTOCIN 40 UNITS IN NORMAL SALINE INFUSION - SIMPLE MED: 2.5000 [IU]/h | INTRAVENOUS | Status: DC

## 2019-12-27 MED ORDER — BUTALBITAL-APAP-CAFFEINE 50-325-40 MG PO TABS
2.0000 | ORAL_TABLET | Freq: Once | ORAL | Status: AC
  Administered 2019-12-27: 2 via ORAL
  Filled 2019-12-27: qty 2

## 2019-12-27 MED ORDER — LACTATED RINGERS IV SOLN: INTRAVENOUS | Status: DC

## 2019-12-27 MED ORDER — OXYTOCIN BOLUS FROM INFUSION
500.0000 mL | Freq: Once | INTRAVENOUS | Status: AC
  Administered 2019-12-28: 500 mL via INTRAVENOUS

## 2019-12-27 MED ORDER — OXYCODONE-ACETAMINOPHEN 5-325 MG PO TABS: 1.0000 | ORAL_TABLET | ORAL | Status: DC | PRN

## 2019-12-27 MED ORDER — SOD CITRATE-CITRIC ACID 500-334 MG/5ML PO SOLN: 30.0000 mL | ORAL | Status: DC | PRN

## 2019-12-27 MED ORDER — CEFAZOLIN SODIUM-DEXTROSE 1-4 GM/50ML-% IV SOLN
1.0000 g | Freq: Three times a day (TID) | INTRAVENOUS | Status: DC
  Administered 2019-12-28 (×2): 1 g via INTRAVENOUS
  Filled 2019-12-27 (×5): qty 50

## 2019-12-27 MED ORDER — MISOPROSTOL 50MCG HALF TABLET
50.0000 ug | ORAL_TABLET | ORAL | Status: DC
  Administered 2019-12-27 – 2019-12-28 (×2): 50 ug via ORAL
  Filled 2019-12-27: qty 1

## 2019-12-27 MED ORDER — LEVETIRACETAM 750 MG PO TABS
750.0000 mg | ORAL_TABLET | Freq: Two times a day (BID) | ORAL | Status: DC
  Administered 2019-12-28 – 2019-12-29 (×4): 750 mg via ORAL
  Filled 2019-12-27 (×8): qty 1

## 2019-12-27 NOTE — MAU Provider Note (Signed)
History     CSN: [id number]  Arrival date and time: 12/27/19 1609   First Provider Initiated Contact with Patient 12/27/19 1754      Chief Complaint  Patient presents with  . BP Evaluation   HPI   [name] is a 24 y.o. female [phone] @ [redacted]w[redacted]d here in MAU with elevated BP's. She had an office visit today at [facility] [address] and her BP was elevated 142/89 & 137/93. States this is not the first time her BP's have been elevated. She has had every day HA for a few weeks now. She has a history of chronic migraines and is using OTC tylenol which only helps some. She has has scotoma daily for 2+ weeks. She was inducted with previous pregnancies d/t PRE E. She has not taken any tylenol today.  Has a seizure disorder and states her seizures have been more frequent lately.   OB History    Gravida  4   Para  2   Term  2   Preterm      AB  1   Living  1     SAB  1   TAB      Ectopic      Multiple  0   Live Births  2           Past Medical History:  Diagnosis Date  . ADHD (attention deficit hyperactivity disorder)   . Allergy   . Anxiety   . Asthma   . Eating disorder   . Headache(784.0)   . Kidney stone    kidney stones  . Migraines   . ODD (oppositional defiant disorder)   . PID (acute pelvic inflammatory disease) 01/29/2016  . PTSD (post-traumatic stress disorder)   . Seizures (HCC)    diagnosed age 60    Past Surgical History:  Procedure Laterality Date  . right knee surgery    . TONSILLECTOMY AND ADENOIDECTOMY      Family History  Problem Relation Age of Onset  . Diabetes Mother   . Diabetes Sister   . Breast cancer Maternal Grandmother   . Diabetes Maternal Grandmother   . Glaucoma Maternal Grandmother   . Other Son        born with hole in heart  . Other Daughter        born with hole in heart    Social History   Tobacco Use  . Smoking status: Former Smoker    Packs/day: 0.50    Types: Cigarettes, E-cigarettes  . Smokeless tobacco: Never Used   . Tobacco comment: nicotine patch  Substance Use Topics  . Alcohol use: Not Currently  . Drug use: Not Currently    Types: Marijuana    Comment: Not since 02/13    Allergies:  Allergies  Allergen Reactions  . Bee Venom Anaphylaxis  . Cashew Nut Oil Anaphylaxis  . Penicillins Anaphylaxis    Has patient had a PCN reaction causing immediate rash, facial/tongue/throat swelling, SOB or lightheadedness with hypotension: Unknown Has patient had a PCN reaction causing severe rash involving mucus membranes or skin necrosis: Unknown Has patient had a PCN reaction that required hospitalization: Unknown Has patient had a PCN reaction occurring within the last 10 years: Unknown If all of the above answers are "NO", then may proceed with Cephalosporin use.   . Ativan [Lorazepam]     Aggressive-agitation  . Lodine [Etodolac] Nausea And Vomiting and Other (See Comments)    Patient states had vomiting when taking medication,  and threw up blood; also complained of severe headaches while on medication  . Prednisone Other (See Comments)    Bleeding nose bleeding and internal bleeding  . Cocoa Butter Rash  . Latex Rash    Medications Prior to Admission  Medication Sig Dispense Refill Last Dose  . acetaminophen (TYLENOL) 500 MG tablet Take 500 mg by mouth every 6 (six) hours as needed for mild pain or moderate pain.     Marland Kitchen azithromycin (ZITHROMAX) 250 MG tablet Take 1 tablet (250 mg total) by mouth daily. Take first 2 tablets together, then 1 every day until finished. (Patient not taking: Reported on 12/27/2019) 6 tablet 0   . guaiFENesin (MUCINEX) 600 MG 12 hr tablet Take 1 tablet (600 mg total) by mouth 2 (two) times daily. (Patient not taking: Reported on 12/27/2019) 60 tablet 1   . levETIRAcetam (KEPPRA) 750 MG tablet Take 750 mg by mouth 2 (two) times daily.     . Prenatal MV-Min-FA-Omega-3 (PRENATAL GUMMIES/DHA & FA) 0.4-32.5 MG CHEW Chew by mouth.       Results for orders placed or performed  during the hospital encounter of 12/27/19 (from the past 48 hour(s))  Urinalysis, Routine w reflex microscopic     Status: Abnormal   Collection Time: 12/27/19  4:19 PM  Result Value Ref Range   Color, Urine AMBER (A) YELLOW    Comment: BIOCHEMICALS MAY BE AFFECTED BY COLOR   APPearance CLOUDY (A) CLEAR   Specific Gravity, Urine 1.020 1.005 - 1.030   pH 7.0 5.0 - 8.0   Glucose, UA NEGATIVE NEGATIVE mg/dL   Hgb urine dipstick NEGATIVE NEGATIVE   Bilirubin Urine NEGATIVE NEGATIVE   Ketones, ur NEGATIVE NEGATIVE mg/dL   Protein, ur 30 (A) NEGATIVE mg/dL   Nitrite NEGATIVE NEGATIVE   Leukocytes,Ua LARGE (A) NEGATIVE   RBC / HPF 0-5 0 - 5 RBC/hpf   WBC, UA 21-50 0 - 5 WBC/hpf   Bacteria, UA MANY (A) NONE SEEN   Squamous Epithelial / LPF 21-50 0 - 5   Mucus PRESENT    Amorphous Crystal PRESENT     Comment: Performed at [facility], [address] [place], [place] [address]  Protein / creatinine ratio, urine     Status: Abnormal   Collection Time: 12/27/19  4:43 PM  Result Value Ref Range   Creatinine, Urine 211.47 mg/dL   Total Protein, Urine 36 mg/dL    Comment: NO NORMAL RANGE ESTABLISHED FOR THIS TEST   Protein Creatinine Ratio 0.17 (H) 0.00 - 0.15 mg/mg[Cre]    Comment: Performed at [facility], [address], [place], [place] [address]  CBC     Status: Abnormal   Collection Time: 12/27/19  6:00 PM  Result Value Ref Range   WBC 15.5 (H) 4.0 - 10.5 K/uL   RBC 3.86 (L) 3.87 - 5.11 MIL/uL   Hemoglobin 11.8 (L) 12.0 - 15.0 g/dL   HCT 78.6 (L) 76.7 - 20.9 %   MCV 92.2 80.0 - 100.0 fL   MCH 30.6 26.0 - 34.0 pg   MCHC 33.1 30.0 - 36.0 g/dL   RDW 47.0 96.2 - 83.6 %   Platelets 223 150 - 400 K/uL   nRBC 0.0 0.0 - 0.2 %    Comment: Performed at [facility], [address], [place], [place] [address]  Comprehensive metabolic panel     Status: Abnormal   Collection Time: 12/27/19  6:00 PM  Result Value Ref Range  Sodium 139 135 - 145 mmol/L   Potassium 3.5  3.5 - 5.1 mmol/L   Chloride 107 98 - 111 mmol/L   CO2 21 (L) 22 - 32 mmol/L   Glucose, Bld 95 70 - 99 mg/dL   BUN 7 6 - 20 mg/dL   Creatinine, Ser 0.63 0.44 - 1.00 mg/dL   Calcium 9.0 8.9 - 10.3 mg/dL   Total Protein 5.9 (L) 6.5 - 8.1 g/dL   Albumin 2.4 (L) 3.5 - 5.0 g/dL   AST 11 (L) 15 - 41 U/L   ALT 10 0 - 44 U/L   Alkaline Phosphatase 109 38 - 126 U/L   Total Bilirubin 0.3 0.3 - 1.2 mg/dL   GFR calc non Af Amer >60 >60 mL/min   GFR calc Af Amer >60 >60 mL/min   Anion gap 11 5 - 15    Comment: Performed at [facility] [address], [place], [place] [address]     Patient Vitals for the past 24 hrs:  BP Temp Temp src Pulse Resp SpO2 Height Weight  12/27/19 1911 112/73 -- -- 96 -- -- -- --  12/27/19 1910 (!) 116/95 -- -- 93 -- 98 % -- --  12/27/19 1904 (!) 125/96 -- -- 90 -- -- -- --  12/27/19 1902 125/80 -- -- 89 -- -- -- --  12/27/19 1901 120/67 -- -- 95 -- -- -- --  12/27/19 1718 104/68 -- -- (!) 214 -- -- -- --  12/27/19 1703 118/84 -- -- (!) 101 -- -- -- --  12/27/19 1702 115/74 -- -- 94 -- -- -- --  12/27/19 1701 122/63 -- -- (!) 104 -- -- -- --  12/27/19 1646 122/74 -- -- (!) 102 -- -- -- --  12/27/19 1632 (!) 134/120 -- -- 100 -- -- -- --  12/27/19 1619 117/81 98.8 F (37.1 C) Oral (!) 114 20 99 % -- --  12/27/19 1613 -- -- -- -- -- -- 5\' 7"  (1.702 m) 100.3 kg   Review of Systems  Constitutional: Negative for fever.  Eyes: Positive for visual disturbance. Negative for photophobia.  Gastrointestinal: Negative for abdominal pain.  Neurological: Positive for headaches. Negative for seizures.   Physical Exam   Blood pressure 112/73, pulse 96, temperature 98.8 F (37.1 C), temperature source Oral, resp. rate 20, height 5\' 7"  (1.702 m), weight 100.3 kg, last menstrual period 04/12/2019, SpO2 98 %, not currently breastfeeding.  Physical Exam  Constitutional: She is oriented to person, place, and time. She appears well-developed and well-nourished. No  distress.  HENT:  Head: Normocephalic.  Eyes: Pupils are equal, round, and reactive to light.  Respiratory: Effort normal.  GI: Soft. She exhibits no distension. There is no abdominal tenderness. There is no rebound.  Musculoskeletal:        General: Normal range of motion.  Neurological: She is alert and oriented to person, place, and time. She displays normal reflexes.  Negative clonus   Skin: Skin is warm. She is not diaphoretic.  Psychiatric: Her behavior is normal.   Fetal Tracing: Baseline: 125 bpm Variability: Moderate  Accelerations: 15x15 Decelerations: None Toco: None  MAU Course  Procedures  None  MDM  Headache 2/10; left temporal; declines tylenol.  [place] labs collected Urine culture pending. Confirmed allergy to prednisone; occurred several years ago. Patient had a nose bleed only after using prednisone. She would prefer to have [place]  Discussed patient with Dr. Kennon Rounds: Will admit patient for severe pre E.  Assessment  and Plan   A:  1. Severe preeclampsia, third trimester   2. Supervision of high risk pregnancy, antepartum   3. Trichomonal vaginitis during pregnancy in third trimester   4. [redacted] weeks gestation of pregnancy      P:  BMZ given Patient to go to Labor and delivery Covid swab pending   Juliann Olesky, Harolyn Rutherford, NP 12/27/2019 8:04 PM

## 2019-12-27 NOTE — MAU Provider Note (Signed)
Pt informed that the ultrasound is considered a limited OB ultrasound and is not intended to be a complete ultrasound exam.  Patient also informed that the ultrasound is not being completed with the intent of assessing for fetal or placental anomalies or any pelvic abnormalities.  Explained that the purpose of today's ultrasound is to assess for  presentation.  Patient acknowledges the purpose of the exam and the limitations of the study.     Vertex position.  Erin Krueger I, NP 12/27/2019 8:16 PM

## 2019-12-27 NOTE — Progress Notes (Signed)
   HIGH-RISK PREGNANCY VISIT Patient name: Erin Krueger MRN [id number]  Date of birth: [birth year] Chief Complaint:   Routine Prenatal Visit  History of Present Illness:   Erin Krueger is a 24 y.o. [phone] female at [redacted]w[redacted]d with an Estimated Date of Delivery: 01/28/20 being seen today for ongoing management of a high-risk pregnancy complicated by seizure d/o on Keppra 750mg  bid.  Today she reports more frequent seizures- typically 1-2 mo, now multiple weekly; states her neurologist is aware but that she is on the max dose of meds; she also had an elevated BP on her televisit 12/30 but did not get a BP recheck. Contractions: Not present. Vag. Bleeding: None.  Movement: Present. denies leaking of fluid.  Review of Systems:   Pertinent items are noted in HPI Denies abnormal vaginal discharge w/ itching/odor/irritation, headaches, visual changes, shortness of breath, chest pain, abdominal pain, severe nausea/vomiting, or problems with urination or bowel movements unless otherwise stated above. Pertinent History Reviewed:  Reviewed past medical,surgical, social, obstetrical and family history.  Reviewed problem list, medications and allergies. Physical Assessment:   Vitals:   12/27/19 1427 12/27/19 1432  BP: (!) 142/89 (!) 137/93  Pulse: 98 (!) 108  Weight: 221 lb (100.2 kg)   Body mass index is 34.61 kg/m.           Physical Examination:   General appearance: alert, well appearing, and in no distress  Mental status: alert, oriented to person, place, and time  Skin: warm & dry   Extremities: Edema: Trace    Cardiovascular: normal heart rate noted  Respiratory: normal respiratory effort, no distress  Abdomen: gravid, soft, non-tender  Pelvic: Cervical exam deferred         Fetal Status: Fetal Heart Rate (bpm): 140 Fundal Height: 35 cm Movement: Present     Results for orders placed or performed in visit on 12/27/19 (from the past 24 hour(s))  POC Urinalysis Dipstick OB   Collection  Time: 12/27/19  2:35 PM  Result Value Ref Range   Color, UA     Clarity, UA     Glucose, UA Negative Negative   Bilirubin, UA     Ketones, UA neg    Spec Grav, UA     Blood, UA neg    pH, UA     POC,PROTEIN,UA Negative Negative, Trace, Small (1+), Moderate (2+), Large (3+), 4+   Urobilinogen, UA     Nitrite, UA neg    Leukocytes, UA Small (1+) (A) Negative   Appearance     Odor      Assessment & Plan:  1) High-risk pregnancy [id number] at [redacted]w[redacted]d with an Estimated Date of Delivery: 01/28/20   2) Seizure d/o, unstable on Keppra 750mg  bid; neurologist aware  3) Elevated BP, to MAU for further work-up and plan  4) Frequent UTIs, urine culture sent today  Meds: No orders of the defined types were placed in this encounter.   Labs/procedures today: gHTN work up at MAU   Reviewed: Preterm labor symptoms and general obstetric precautions including but not limited to vaginal bleeding, contractions, leaking of fluid and fetal movement were reviewed in detail with the patient.  All questions were answered. Has home bp cuff. Check bp weekly, let 03/29/20 know if >140/90.   Follow-up: No follow-ups on file.  Orders Placed This Encounter  Procedures  . Culture, OB Urine  . POC Urinalysis Dipstick OB   [facility] 12/27/2019 3:13 PM

## 2019-12-27 NOTE — MAU Note (Signed)
Presents for BP evaluation, sent from MD office.  Currently has H/A, no visual disturbances, reports pain in LUQ.  Also reports hx of epilepsy and increased seizure activity, 4-5 seizures weekly from 4-5 per month.

## 2019-12-27 NOTE — MAU Note (Addendum)
Pt is a G4P2 at 35.3 weeks with preexisting seizure condition that has been worsening in the last week from 2-3 seizures per month, with an increase to 4-5 in the last week.  Pt is currently on Keppra and being followed by an neurologist.   Pt reports that during seizures she is stiff and unresponsive and that she does not remember what happens during seizure but does get visual auras to indicate a seizure is happening.  She reports that she will often bite her tongue or cheek and that it takes several hours after for her to feel "normal" again.     Pt has h/o Pre-eclampsia with seizure disorder in previous 2 pregnancies.    Today BP was elevated at home and pt was told to come here by MD.  Pt has HA, blurry vision, no RUQ pain but does report LUQ pain.  Pt reports decreased fetal movement, no LOF or bleeding.

## 2019-12-27 NOTE — H&P (Addendum)
OBSTETRIC ADMISSION HISTORY AND PHYSICAL  Erin Krueger is a 24 y.o. female [phone] with IUP at [redacted]w[redacted]d by 6wk [place] presenting for IOL d/t PEC w/ SF. She reports +FMs, No LOF, no VB, scotomata and daily HA. She denies RUQ pain.  She plans on breast feeding. She requests nexplanon for birth control. She received her prenatal care at [facility]   Dating: By 6wk [place] --->  Estimated Date of Delivery: 01/28/20  Sono:    @[redacted]w[redacted]d , CWD, LVEICF 1.8 mm (fetal echo WNL), cephalic presentation, posterior placental lie, 313g, 65% EFW   Prenatal History/Complications: H/O PEC w/ SF in prior pregnancies Seizure d/o, on Keppra Trichomonas, TOC 07/19/19 BPD type 1 Obesity Recurrent UTI, on Macrobid 100 mg qhs until end of pregnancy H/O congenital heart defects, son has VSD, first child (girl) died at age 1 mo PTSD H/O polysubstance use disorder Allergy to PCN  Past Medical History: Past Medical History:  Diagnosis Date  . ADHD (attention deficit hyperactivity disorder)   . Allergy   . Anxiety   . Asthma   . Eating disorder   . Headache(784.0)   . Kidney stone    kidney stones  . Migraines   . ODD (oppositional defiant disorder)   . PID (acute pelvic inflammatory disease) 01/29/2016  . PTSD (post-traumatic stress disorder)   . Seizures ([place])    diagnosed age 47    Past Surgical History: Past Surgical History:  Procedure Laterality Date  . right knee surgery    . TONSILLECTOMY AND ADENOIDECTOMY      Obstetrical History: OB History    Gravida  4   Para  2   Term  2   Preterm      AB  1   Living  1     SAB  1   TAB      Ectopic      Multiple  0   Live Births  2           Social History: Social History   Socioeconomic History  . Marital status: Single    Spouse name: Not on file  . Number of children: 2  . Years of education: Not on file  . Highest education level: Not on file  Occupational History  . 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  Smoking status: Former Smoker    Packs/day: 0.50    Types: Cigarettes, E-cigarettes  . Smokeless tobacco: Never Used  . Tobacco comment: nicotine patch  Substance and Sexual Activity  . Alcohol use: Not Currently  . Drug use: Not Currently    Types: Marijuana    Comment: Not since 02/13  . Sexual activity: Yes    Birth control/protection: None  Other Topics Concern  . Not on file  Social History Narrative  . Not on file   Social Determinants of Health   Financial Resource Strain:   . Difficulty of Paying Living Expenses: Not on file  Food Insecurity:   . Worried About Charity fundraiser in the Last Year: Not on file  . Ran Out of Food in the Last Year: Not on file  Transportation Needs:   . Lack of Transportation (Medical): Not on file  . Lack of Transportation (Non-Medical): Not on file  Physical Activity:   . Days of Exercise per Week: Not on file  . Minutes of Exercise per Session: Not on file  Stress:   . Feeling of Stress : Not on file  Social Connections:   . Frequency of Communication with Friends and Family: Not on file  . Frequency of Social Gatherings with Friends and Family: Not on file  . Attends Religious Services: Not on file  . Active Member of Clubs or Organizations: Not on file  . Attends Banker Meetings: Not on file  . Marital Status: Not on file    Family History: Family History  Problem Relation Age of Onset  . Diabetes Mother   . Diabetes Sister   . Breast cancer Maternal Grandmother   . Diabetes Maternal Grandmother   . Glaucoma Maternal Grandmother   . Other Son        born with hole in heart  . Other Daughter        born with hole in heart    Allergies: Allergies  Allergen Reactions  . Bee Venom Anaphylaxis  . Cashew Nut Oil Anaphylaxis  . Penicillins Anaphylaxis    Has patient had a PCN reaction causing immediate rash, facial/tongue/throat swelling, SOB or lightheadedness with hypotension: Unknown Has patient had a  PCN reaction causing severe rash involving mucus membranes or skin necrosis: Unknown Has patient had a PCN reaction that required hospitalization: Unknown Has patient had a PCN reaction occurring within the last 10 years: Unknown If all of the above answers are "NO", then may proceed with Cephalosporin use.   . Ativan [Lorazepam]     Aggressive-agitation  . Lodine [Etodolac] Nausea And Vomiting and Other (See Comments)    Patient states had vomiting when taking medication, and threw up blood; also complained of severe headaches while on medication  . Prednisone Other (See Comments)    Bleeding nose bleeding and internal bleeding  . Cocoa Butter Rash  . Latex Rash    Medications Prior to Admission  Medication Sig Dispense Refill Last Dose  . acetaminophen (TYLENOL) 500 MG tablet Take 500 mg by mouth every 6 (six) hours as needed for mild pain or moderate pain.     Marland Kitchen azithromycin (ZITHROMAX) 250 MG tablet Take 1 tablet (250 mg total) by mouth daily. Take first 2 tablets together, then 1 every day until finished. (Patient not taking: Reported on 12/27/2019) 6 tablet 0   . guaiFENesin (MUCINEX) 600 MG 12 hr tablet Take 1 tablet (600 mg total) by mouth 2 (two) times daily. (Patient not taking: Reported on 12/27/2019) 60 tablet 1   . levETIRAcetam (KEPPRA) 750 MG tablet Take 750 mg by mouth 2 (two) times daily.     . Prenatal MV-Min-FA-Omega-3 (PRENATAL GUMMIES/DHA & FA) 0.4-32.5 MG CHEW Chew by mouth.        Review of Systems   All systems reviewed and negative except as stated in HPI  Blood pressure 112/73, pulse 96, temperature 98.8 F (37.1 C), temperature source Oral, resp. rate 20, height 5\' 7"  (1.702 m), weight 100.3 kg, last menstrual period 04/12/2019, SpO2 98 %, not currently breastfeeding. General appearance: alert, cooperative, appears stated age and no distress Lungs: normal effort Heart: regular rate  Abdomen: soft, non-tender; bowel sounds normal Pelvic: gravid uterus GU: No  vaginal lesions  Extremities: Homans sign is negative, no sign of DVT, trace pitting edema of bilateral LEs DTR's intact Presentation: cephalic Fetal monitoringBaseline: 130 bpm, Variability: Good {> 6 bpm), Accelerations: Reactive and Decelerations: Absent Uterine activity: None     Prenatal labs: ABO, Rh: --/--/PENDING (02/03 1952) Antibody: PENDING (02/03 1952) Rubella: 8.56 (08/26 1556) RPR: Non Reactive (12/15 0858)  HBsAg: Negative (08/26 1556)  HIV:  Non Reactive (12/15 0858)  GBS:   unk, PCN allergy 2 hr Glucola 45 Genetic screening  MaterniT 18 WNL female Anatomy US LVEICF 1.60mm, subsequent fetal echo WNL  Prenatal Transfer Tool  Maternal Diabetes: No Genetic Screening: Normal Maternal Ultrasounds/Referrals: Isolated EIF (echogenic intracardiac focus) Fetal Ultrasounds or other Referrals:  Fetal echo Maternal Substance Abuse:  No Significant Maternal Medications:  Meds include: Other: Keppra Significant Maternal Lab Results: Other: GBS unk, PCN allergy  Results for orders placed or performed during the hospital encounter of 12/27/19 (from the past 24 hour(s))  Urinalysis, Routine w reflex microscopic   Collection Time: 12/27/19  4:19 PM  Result Value Ref Range   Color, Urine AMBER (A) YELLOW   APPearance CLOUDY (A) CLEAR   Specific Gravity, Urine 1.020 1.005 - 1.030   pH 7.0 5.0 - 8.0   Glucose, UA NEGATIVE NEGATIVE mg/dL   Hgb urine dipstick NEGATIVE NEGATIVE   Bilirubin Urine NEGATIVE NEGATIVE   Ketones, ur NEGATIVE NEGATIVE mg/dL   Protein, ur 30 (A) NEGATIVE mg/dL   Nitrite NEGATIVE NEGATIVE   Leukocytes,Ua LARGE (A) NEGATIVE   RBC / HPF 0-5 0 - 5 RBC/hpf   WBC, UA 21-50 0 - 5 WBC/hpf   Bacteria, UA MANY (A) NONE SEEN   Squamous Epithelial / LPF 21-50 0 - 5   Mucus PRESENT    Amorphous Crystal PRESENT   Protein / creatinine ratio, urine   Collection Time: 12/27/19  4:43 PM  Result Value Ref Range   Creatinine, Urine 211.47 mg/dL   Total Protein, Urine  36 mg/dL   Protein Creatinine Ratio 0.17 (H) 0.00 - 0.15 mg/mg[Cre]  CBC   Collection Time: 12/27/19  6:00 PM  Result Value Ref Range   WBC 15.5 (H) 4.0 - 10.5 K/uL   RBC 3.86 (L) 3.87 - 5.11 MIL/uL   Hemoglobin 11.8 (L) 12.0 - 15.0 g/dL   HCT 52.8 (L) 41.3 - 24.4 %   MCV 92.2 80.0 - 100.0 fL   MCH 30.6 26.0 - 34.0 pg   MCHC 33.1 30.0 - 36.0 g/dL   RDW 01.0 27.2 - 53.6 %   Platelets 223 150 - 400 K/uL   nRBC 0.0 0.0 - 0.2 %  Comprehensive metabolic panel   Collection Time: 12/27/19  6:00 PM  Result Value Ref Range   Sodium 139 135 - 145 mmol/L   Potassium 3.5 3.5 - 5.1 mmol/L   Chloride 107 98 - 111 mmol/L   CO2 21 (L) 22 - 32 mmol/L   Glucose, Bld 95 70 - 99 mg/dL   BUN 7 6 - 20 mg/dL   Creatinine, Ser 6.44 0.44 - 1.00 mg/dL   Calcium 9.0 8.9 - 03.4 mg/dL   Total Protein 5.9 (L) 6.5 - 8.1 g/dL   Albumin 2.4 (L) 3.5 - 5.0 g/dL   AST 11 (L) 15 - 41 U/L   ALT 10 0 - 44 U/L   Alkaline Phosphatase 109 38 - 126 U/L   Total Bilirubin 0.3 0.3 - 1.2 mg/dL   GFR calc non Af Amer >60 >60 mL/min   GFR calc Af Amer >60 >60 mL/min   Anion gap 11 5 - 15  Type and screen [facility]   Collection Time: 12/27/19  7:52 PM  Result Value Ref Range   ABO/RH(D) PENDING    Antibody Screen PENDING    Sample Expiration      12/30/2019,2359 Performed at [facility], [address], [place], [place]  [address]   Results for orders placed or performed in visit on 12/27/19 (from the past 24 hour(s))  POC Urinalysis Dipstick OB   Collection Time: 12/27/19  2:35 PM  Result Value Ref Range   Color, UA     Clarity, UA     Glucose, UA Negative Negative   Bilirubin, UA     Ketones, UA neg    Spec Grav, UA     Blood, UA neg    pH, UA     POC,PROTEIN,UA Negative Negative, Trace, Small (1+), Moderate (2+), Large (3+), 4+   Urobilinogen, UA     Nitrite, UA neg    Leukocytes, UA Small (1+) (A) Negative   Appearance     Odor      Patient Active Problem List   Diagnosis  Date Noted  . Obesity affecting pregnancy in third trimester 11/07/2019  . Adopted person 10/04/2019  . Supervision of high risk pregnancy, antepartum 07/19/2019  . Trichomonal vaginitis during pregnancy 06/09/2019  . Status epilepticus (HCC) 11/29/2018  . Asthma 11/29/2018  . Recurrent UTI (urinary tract infection) complicating pregnancy 04/08/2018  . Marijuana use 04/07/2018  . Smoker Apr 07, 2018  . Death of child Apr 07, 2018  . 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 Bipolar 1 disorder (HCC) 05/09/2016  . Breakthrough seizure (HCC) 01/07/2016  . Seizure (HCC) 01/07/2016  . 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 PTSD (post-traumatic stress disorder) 05/12/2013  . 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 Polysubstance abuse (HCC) 05/12/2013    Assessment/Plan:  Seferina Brokaw is a 24 y.o. G4P2011 at [redacted]w[redacted]d here for IOL d/t PEC w/ SF (scotoma, HA)  #Labor: Risks and benefits of induction were reviewed, including failure of method, prolonged labor, need for further intervention, risk of cesarean.  Patient and family seem to understand these risks and wish to proceed. Options of cytotec, foley bulb, AROM, and pitocin reviewed, with use of each discussed. Plan to begin with cytotec x1 and place FB when able. CVE 1/thick/-3. #PEC w/ SF: Will attempt to treat HA with tylenol or fioricet, if no improvement will start Mg. BP mildly elevated to 110-130s/70-90s. Will continue to monitor. P:C 0.17. AST/ALT 11/10, Cr 0.63, Plt 223 WNL. #Preterm: BMZ given tonight, second dose tomorrow. NICU aware. #Pain: Per patient request  #FWB:  Cat I; EFW: 6# #ID:  GBS unk, reported PCN allergy is nose bleed, Ancef ordered. #MOF: breast #MOC: Nexplanon #Circ:  Yes, outpatient  Shirlean Mylar, MD [facility] Res[facility], PGY-1 12/27/2019, 8:35 PM  CNM attestation:  I have seen and examined this patient; I agree with above documentation in the  resident's note.   Erin Krueger is a 24 y.o. [phone] here for IOL due to pre-e with severe features (BPs elevated but not severely; pre-e labs neg; has H/A)  PE: BP 112/73   Pulse (!) 102   Temp 98.1 F (36.7 C) (Oral)   Resp 16   Ht 5\' 7"  (1.702 m)   Wt 100.3 kg   LMP 04/12/2019 (Approximate)   SpO2 98%   BMI 34.64 kg/m  Gen: calm comfortable, NAD Resp: normal effort, no distress Abd: gravid  ROS, labs, PMH reviewed  Plan: Admit to Labor and Delivery Start with cervical ripening with cytotec/cervical foley, then Pit/AROM Fioricet for H/A> if no improvement will start mag sulfate Ancef for GBS unknown BMZ x 2 NICU made aware of admission Anticipate vag del  04/14/2019 CNM 12/27/2019, 11:51 PM

## 2019-12-28 ENCOUNTER — Inpatient Hospital Stay (HOSPITAL_COMMUNITY): Payer: Medicaid Other | Admitting: Anesthesiology

## 2019-12-28 ENCOUNTER — Encounter (HOSPITAL_COMMUNITY): Payer: Self-pay | Admitting: Family Medicine

## 2019-12-28 DIAGNOSIS — O159 Eclampsia, unspecified as to time period: Secondary | ICD-10-CM

## 2019-12-28 LAB — COMPREHENSIVE METABOLIC PANEL
ALT: 12 U/L (ref 0–44)
AST: 19 U/L (ref 15–41)
Albumin: 2.5 g/dL — ABNORMAL LOW (ref 3.5–5.0)
Alkaline Phosphatase: 109 U/L (ref 38–126)
Anion gap: 13 (ref 5–15)
BUN: 5 mg/dL — ABNORMAL LOW (ref 6–20)
CO2: 18 mmol/L — ABNORMAL LOW (ref 22–32)
Calcium: 8.4 mg/dL — ABNORMAL LOW (ref 8.9–10.3)
Chloride: 105 mmol/L (ref 98–111)
Creatinine, Ser: 0.69 mg/dL (ref 0.44–1.00)
GFR calc Af Amer: >60 mL/min (ref >60)
GFR calc non Af Amer: >60 mL/min (ref >60)
Glucose, Bld: 118 mg/dL — ABNORMAL HIGH (ref 70–99)
Potassium: 3.7 mmol/L (ref 3.5–5.1)
Sodium: 136 mmol/L (ref 135–145)
Total Bilirubin: 0.5 mg/dL (ref 0.3–1.2)
Total Protein: 5.9 g/dL — ABNORMAL LOW (ref 6.5–8.1)

## 2019-12-28 LAB — CBC
HCT: 35.8 % — ABNORMAL LOW (ref 36.0–46.0)
HCT: 36.4 % (ref 36.0–46.0)
Hemoglobin: 11.8 g/dL — ABNORMAL LOW (ref 12.0–15.0)
Hemoglobin: 12 g/dL (ref 12.0–15.0)
MCH: 30.3 pg (ref 26.0–34.0)
MCH: 30.4 pg (ref 26.0–34.0)
MCHC: 33 g/dL (ref 30.0–36.0)
MCHC: 33 g/dL (ref 30.0–36.0)
MCV: 92 fL (ref 80.0–100.0)
MCV: 92.2 fL (ref 80.0–100.0)
Platelets: 232 10*3/uL (ref 150–400)
Platelets: 240 10*3/uL (ref 150–400)
RBC: 3.89 MIL/uL (ref 3.87–5.11)
RBC: 3.95 MIL/uL (ref 3.87–5.11)
RDW: 12.2 % (ref 11.5–15.5)
RDW: 12 % (ref 11.5–15.5)
WBC: 16.8 10*3/uL — ABNORMAL HIGH (ref 4.0–10.5)
WBC: 28.6 10*3/uL — ABNORMAL HIGH (ref 4.0–10.5)
nRBC: 0 % (ref 0.0–0.2)
nRBC: 0 % (ref 0.0–0.2)

## 2019-12-28 LAB — SARS CORONAVIRUS 2 (TAT 6-24 HRS): SARS Coronavirus 2: NEGATIVE

## 2019-12-28 LAB — MAGNESIUM: Magnesium: 3.9 mg/dL — ABNORMAL HIGH (ref 1.7–2.4)

## 2019-12-28 LAB — RPR: RPR Ser Ql: NONREACTIVE

## 2019-12-28 MED ORDER — MAGNESIUM SULFATE BOLUS VIA INFUSION
4.0000 g | Freq: Once | INTRAVENOUS | Status: AC
  Administered 2019-12-28: 4 g via INTRAVENOUS
  Filled 2019-12-28: qty 1000

## 2019-12-28 MED ORDER — LABETALOL HCL 5 MG/ML IV SOLN: 20.0000 mg | INTRAVENOUS | Status: DC | PRN

## 2019-12-28 MED ORDER — LACTATED RINGERS IV SOLN: 500.0000 mL | Freq: Once | INTRAVENOUS | Status: DC

## 2019-12-28 MED ORDER — PRENATAL MULTIVITAMIN CH
1.0000 | ORAL_TABLET | Freq: Every day | ORAL | Status: DC
  Administered 2019-12-29: 1 via ORAL
  Filled 2019-12-28: qty 1

## 2019-12-28 MED ORDER — ONDANSETRON HCL 4 MG/2ML IJ SOLN: 4.0000 mg | INTRAMUSCULAR | Status: DC | PRN

## 2019-12-28 MED ORDER — LABETALOL HCL 5 MG/ML IV SOLN: 40.0000 mg | INTRAVENOUS | Status: DC | PRN

## 2019-12-28 MED ORDER — FENTANYL-BUPIVACAINE-NACL 0.5-0.125-0.9 MG/250ML-% EP SOLN
12.0000 mL/h | EPIDURAL | Status: DC | PRN
  Filled 2019-12-28: qty 250

## 2019-12-28 MED ORDER — OXYCODONE HCL 5 MG PO TABS
5.0000 mg | ORAL_TABLET | ORAL | Status: DC | PRN
  Administered 2019-12-28: 5 mg via ORAL
  Filled 2019-12-28: qty 1

## 2019-12-28 MED ORDER — EPHEDRINE 5 MG/ML INJ: 10.0000 mg | INTRAVENOUS | Status: DC | PRN

## 2019-12-28 MED ORDER — MAGNESIUM HYDROXIDE 400 MG/5ML PO SUSP: 30.0000 mL | ORAL | Status: DC | PRN

## 2019-12-28 MED ORDER — MAGNESIUM SULFATE 40 GM/1000ML IV SOLN
2.0000 g/h | INTRAVENOUS | Status: DC
  Administered 2019-12-28: 2 g/h via INTRAVENOUS

## 2019-12-28 MED ORDER — WITCH HAZEL-GLYCERIN EX PADS: 1.0000 "application " | MEDICATED_PAD | CUTANEOUS | Status: DC | PRN

## 2019-12-28 MED ORDER — SENNOSIDES-DOCUSATE SODIUM 8.6-50 MG PO TABS
2.0000 | ORAL_TABLET | ORAL | Status: DC
  Administered 2019-12-28: 2 via ORAL
  Filled 2019-12-28: qty 2

## 2019-12-28 MED ORDER — DIPHENHYDRAMINE HCL 50 MG/ML IJ SOLN: 12.5000 mg | INTRAMUSCULAR | Status: DC | PRN

## 2019-12-28 MED ORDER — SODIUM CHLORIDE (PF) 0.9 % IJ SOLN
INTRAMUSCULAR | Status: DC | PRN
  Administered 2019-12-28: 12 mL/h via EPIDURAL

## 2019-12-28 MED ORDER — DIBUCAINE (PERIANAL) 1 % EX OINT: 1.0000 "application " | TOPICAL_OINTMENT | CUTANEOUS | Status: DC | PRN

## 2019-12-28 MED ORDER — HYDRALAZINE HCL 20 MG/ML IJ SOLN: 5.0000 mg | INTRAMUSCULAR | Status: DC | PRN

## 2019-12-28 MED ORDER — BETAMETHASONE SOD PHOS & ACET 6 (3-3) MG/ML IJ SUSP
12.0000 mg | Freq: Once | INTRAMUSCULAR | Status: AC
  Administered 2019-12-28: 12 mg via INTRAMUSCULAR

## 2019-12-28 MED ORDER — PHENYLEPHRINE 40 MCG/ML (10ML) SYRINGE FOR IV PUSH (FOR BLOOD PRESSURE SUPPORT): 80.0000 ug | PREFILLED_SYRINGE | INTRAVENOUS | Status: DC | PRN

## 2019-12-28 MED ORDER — ONDANSETRON HCL 4 MG PO TABS: 4.0000 mg | ORAL_TABLET | ORAL | Status: DC | PRN

## 2019-12-28 MED ORDER — LIDOCAINE HCL (PF) 1 % IJ SOLN
INTRAMUSCULAR | Status: DC | PRN
  Administered 2019-12-28 (×2): 4 mL via EPIDURAL

## 2019-12-28 MED ORDER — DIPHENHYDRAMINE HCL 25 MG PO CAPS: 25.0000 mg | ORAL_CAPSULE | Freq: Four times a day (QID) | ORAL | Status: DC | PRN

## 2019-12-28 MED ORDER — FENTANYL CITRATE (PF) 100 MCG/2ML IJ SOLN
INTRAMUSCULAR | Status: AC
  Filled 2019-12-28: qty 2

## 2019-12-28 MED ORDER — COCONUT OIL OIL: 1.0000 "application " | TOPICAL_OIL | Status: DC | PRN

## 2019-12-28 MED ORDER — OXYCODONE HCL 5 MG PO TABS: 10.0000 mg | ORAL_TABLET | ORAL | Status: DC | PRN

## 2019-12-28 MED ORDER — FENTANYL CITRATE (PF) 100 MCG/2ML IJ SOLN
100.0000 ug | INTRAMUSCULAR | Status: DC | PRN
  Administered 2019-12-28 (×2): 100 ug via INTRAVENOUS
  Filled 2019-12-28: qty 2

## 2019-12-28 MED ORDER — ACETAMINOPHEN 325 MG PO TABS: 650.0000 mg | ORAL_TABLET | ORAL | Status: DC | PRN

## 2019-12-28 MED ORDER — OXYTOCIN 40 UNITS IN NORMAL SALINE INFUSION - SIMPLE MED
1.0000 m[IU]/min | INTRAVENOUS | Status: DC
  Administered 2019-12-28: 2 m[IU]/min via INTRAVENOUS
  Filled 2019-12-28: qty 1000

## 2019-12-28 MED ORDER — BENZOCAINE-MENTHOL 20-0.5 % EX AERO: 1.0000 "application " | INHALATION_SPRAY | CUTANEOUS | Status: DC | PRN

## 2019-12-28 MED ORDER — MAGNESIUM SULFATE 40 GM/1000ML IV SOLN
2.0000 g/h | INTRAVENOUS | Status: DC
  Filled 2019-12-28: qty 1000

## 2019-12-28 MED ORDER — HYDRALAZINE HCL 20 MG/ML IJ SOLN: 10.0000 mg | INTRAMUSCULAR | Status: DC | PRN

## 2019-12-28 MED ORDER — TERBUTALINE SULFATE 1 MG/ML IJ SOLN: 0.2500 mg | Freq: Once | INTRAMUSCULAR | Status: DC | PRN

## 2019-12-28 MED ORDER — SIMETHICONE 80 MG PO CHEW: 80.0000 mg | CHEWABLE_TABLET | ORAL | Status: DC | PRN

## 2019-12-28 NOTE — Anesthesia Preprocedure Evaluation (Addendum)
Anesthesia Evaluation  Patient identified by MRN, date of birth, ID band Patient awake    Reviewed: Allergy & Precautions, Patient's Chart, lab work & pertinent test results  Airway Mallampati: II  TM Distance: >3 FB Neck ROM: Full    Dental no notable dental hx. (+) Teeth Intact   Pulmonary asthma , former smoker,    Pulmonary exam normal breath sounds clear to auscultation       Cardiovascular hypertension, Normal cardiovascular exam Rhythm:Regular Rate:Normal   Pre eclampsia   Neuro/Psych  Headaches, Seizures -, Well Controlled,  PSYCHIATRIC DISORDERS Anxiety Bipolar Disorder Hx/o Oppositional [place] disorder   GI/Hepatic GERD  ,(+)     substance abuse  , Hx/o Polysubstance abuse   Endo/Other  Obesity  Renal/GU Renal diseaseHx/o renal calculi  negative genitourinary   Musculoskeletal negative musculoskeletal ROS (+)   Abdominal (+) + obese,   Peds  Hematology  (+) anemia ,   Anesthesia Other Findings   Reproductive/Obstetrics (+) Pregnancy Pre eclampsia with severe features 35 4/7 weeks                         Anesthesia Physical Anesthesia Plan  ASA: III  Anesthesia Plan: Epidural   Post-op Pain Management:    Induction:   PONV Risk Score and Plan:   Airway Management Planned: Natural Airway  Additional Equipment:   Intra-op Plan:   Post-operative Plan:   Informed Consent: I have reviewed the patients History and Physical, chart, labs and discussed the procedure including the risks, benefits and alternatives for the proposed anesthesia with the patient or authorized representative who has indicated his/her understanding and acceptance.       Plan Discussed with: Anesthesiologist  Anesthesia Plan Comments:        Anesthesia Quick Evaluation

## 2019-12-28 NOTE — Progress Notes (Addendum)
When reviewing OB history with pt, pt states that "there must be a miscommunication somewhere because I have two living children at home. 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 When reviewing pt's chart, I discovered that pt did not have custody of her other two children and multiple notes stated that her daughter died at 7 months of life in surgery for correction of a cardiac defect. Pt has not mentioned putting this baby up for adoption or placing the newborn in foster care. It was also noted that pt had polysubstance abuse with her previous children. Pt denies any substance use with this pregnancy. She has a history of multiple incarcerations and [facility] stays. Dr. Crissie Reese made aware of these findings. SW consult will be placed PP.

## 2019-12-28 NOTE — Anesthesia Procedure Notes (Signed)
Epidural Patient location during procedure: OB Start time: 12/28/2019 6:40 AM End time: 12/28/2019 6:48 AM  Staffing Anesthesiologist: Mal Amabile, MD Performed: anesthesiologist   Preanesthetic Checklist Completed: patient identified, IV checked, site marked, risks and benefits discussed, surgical consent, monitors and equipment checked, pre-op evaluation and timeout performed  Epidural Patient position: sitting Prep: DuraPrep and site prepped and draped Patient monitoring: continuous pulse ox and blood pressure Approach: midline Location: L3-L4 Injection technique: LOR air  Needle:  Needle type: Tuohy  Needle gauge: 17 G Needle length: 9 cm and 9 Needle insertion depth: 5 cm cm Catheter type: closed end flexible Catheter size: 19 Gauge Catheter at skin depth: 10 cm Test dose: negative and Other  Assessment Events: blood not aspirated, injection not painful, no injection resistance, no paresthesia and negative IV test  Additional Notes Patient identified. Risks and benefits discussed including failed block, incomplete  Pain control, post dural puncture headache, nerve damage, paralysis, blood pressure Changes, nausea, vomiting, reactions to medications-both toxic and allergic and post Partum back pain. All questions were answered. Patient expressed understanding and wished to proceed. Sterile technique was used throughout procedure. Epidural site was Dressed with sterile barrier dressing. No paresthesias, signs of intravascular injection Or signs of intrathecal spread were encountered.  Patient was more comfortable after the epidural was dosed. Please see RN's note for documentation of vital signs and FHR which are stable. Reason for block:procedure for pain

## 2019-12-28 NOTE — Progress Notes (Signed)
Labor Progress Note Erin Krueger is a 24 y.o. G4P2011 at [redacted]w[redacted]d presented for IOL d/t PEC w/ SF S: headache improved after fioricet  O:  BP 129/64   Pulse (!) 117   Temp 98.1 F (36.7 C) (Oral)   Resp 16   Ht 5\' 7"  (1.702 m)   Wt 100.3 kg   LMP 04/12/2019 (Approximate)   SpO2 98%   BMI 34.64 kg/m  EFM: 120 bpm/+accelerations, good variability/-decels but technically difficult to trace  CVE: Dilation: 1.5 Effacement (%): Thick Station: -3 Presentation: Vertex Exam by:: [email or web address], MD   A&P: 24 y.o. [phone] [redacted]w[redacted]d here for IOL d/t PEC w/ SF #Labor: CVE is still thick, but very soft. Cook placed at this check and second dose of cytotec given. Plan for pitocin when effacement improved. #PEC: Patient has remained normotensive since mild elevation at admission 116/95. Headache improved after fioricet. Currently not endorsing any sx. #Pain: per patient request #FWB: Cat I #GBS unk, PCN allergy, Ancef ordered (see H&P)  [redacted]w[redacted]d, MD 3:20 AM

## 2019-12-28 NOTE — Lactation Note (Addendum)
This note was copied from a baby's chart. Lactation Consultation Note  Patient Name: Erin Krueger [id number] Date: 12/28/2019 Reason for consult: Initial assessment;Late-preterm 34-36.6wks;1st time breastfeeding   LPTI guidelines given to Mom and shared information.  LC in to visit with P3 Mom of LPTI at 3 hrs old.  Baby has latched and breastfed baby for 5 mins.  Baby's temperature dropped and is currently in the 5th floor nursery under a warmer.  Talked to Mom about her feeding goals.  She would like to try to breastfeed baby, but if he doesn't latch, she wants to pump and bottle feed her milk.  Explained to Mom about LPTI and how they are more immature, sleepy and are at risk for weight loss, low blood sugars etc, and importance of regular supplementation with EBM+/formula.   Reviewed breast massage and hand expression, colostrum easily expressed.  Colostrum container given to Mom and encouraged her to express her colostrum into them for baby.  Set up DEBP and assisted Mom with her first pumping on initiation setting.   Plan (written on dry erase board) 1- Keep baby STS as much as possible 2- Offer breast with cues, limiting time on breast to 30 mins. 3- if baby sleeping, recommended she awaken baby after 3 hrs, and offer breast 4- supplement baby with 5-10 ml of EBM+/formula by slow flow paced bottle 5- Pump both breasts for 15 mins, adding breast massage and hand expression.  6- ask for help prn. Does not have a DEBP at home. Has WIC in [place], faxed [facility] referral for pump. Lactation brochure given to Mom. RN given report on need to regularly be supplementing baby.  Unsure how much information Mom and FOB absorbed.  Lactation Tools Discussed/Used Tools: Pump;Flanges Flange Size: 24 Breast pump type: Double-Electric Breast Pump WIC Program: Yes Pump Review: Setup, frequency, and cleaning;Milk Storage Initiated by:: Erby Pian RN IBCLC Date initiated::  12/28/19   Consult Status Consult Status: Follow-up Date: 12/29/19 Follow-up type: In-patient    Erin Krueger 12/28/2019, 7:26 PM

## 2019-12-28 NOTE — Progress Notes (Signed)
Spoke w on call Neuro regarding plan should patient have seizure given hx of seizures and pseudoseizures.   Recommendation to treat any seizure activity as true seizure, call Neuro immediately for in person eval, load with Keppra 1500mg  IV, give 1mg  IV Ativan to break seizure if >2 minutes, no contraindication to Mg loading per usual OB practice.

## 2019-12-28 NOTE — Progress Notes (Signed)
LABOR PROGRESS NOTE  Erin Krueger is a 24 y.o. G4P2011 at [redacted]w[redacted]d presented for IOL d/t PEC w/ SF (headache).  Subjective: No reports of headache, scotoma. Agreeable to AROM now.  Objective: BP 115/86   Pulse (!) 106   Temp 98.4 F (36.9 C) (Oral)   Resp 18   Ht 5\' 7"  (1.702 m)   Wt 100.3 kg   LMP 04/12/2019 (Approximate)   SpO2 98%   BMI 34.64 kg/m   Dilation: 6 Effacement (%): 90 Station: -3 Presentation: Vertex Exam by:: Dr. [email or web address] Fetal monitoring: Baseline: 125 bpm, Variability: Good {> 6 bpm), Accelerations: Reactive, and Decelerations: Absent Uterine activity: Frequency: Every 2-3 minutes, Duration: 60 seconds, and Intensity: moderate  Labs: Lab Results  Component Value Date   WBC 16.8 (H) 12/28/2019   HGB 11.8 (L) 12/28/2019   HCT 35.8 (L) 12/28/2019   MCV 92.0 12/28/2019   PLT 232 12/28/2019    Patient Active Problem List   Diagnosis Date Noted  . Severe preeclampsia 12/27/2019  . Obesity affecting pregnancy in third trimester 11/07/2019  . Adopted person 10/04/2019  . Supervision of high risk pregnancy, antepartum 07/19/2019  . Trichomonal vaginitis during pregnancy 06/09/2019  . Status epilepticus (HCC) 11/29/2018  . Asthma 11/29/2018  . Recurrent UTI (urinary tract infection) complicating pregnancy 04/08/2018  . Marijuana use 04/07/2018  . Smoker May 01, 2018  . Death of child May 01, 2018  . 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 Bipolar 1 disorder (HCC) 05/09/2016  . Breakthrough seizure (HCC) 01/07/2016  . Seizure (HCC) 01/07/2016  . 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 PTSD (post-traumatic stress disorder) 05/12/2013  . 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 Polysubstance abuse (HCC) 05/12/2013    Assessment / Plan: Erin Krueger is a 24 y.o. [phone] at [redacted]w[redacted]d presented for IOL d/t PEC w/ SF.  #Labor: FB is out and labor progressing well. AROM @1450   #PEC: Patient has remained normotensive this  AM. No h/a since Fioricet. Currently not endorsing any sx. #Pain: per patient request #FWB: Cat I #GBS unkown, PCN allergy, Ancef started 2156 on 12/27/2019  2157, DO, PGY-1 Family Medicine Resident, [facility] Faculty Teaching Service  12/28/2019, 2:56 PM

## 2019-12-28 NOTE — Discharge Summary (Signed)
Postpartum Discharge Summary     Patient Name: Erin Krueger DOB: Jul 21, 1996 MRN: [id number]  Date of admission: 12/27/2019 Delivering Provider: Pollyann Krueger   Date of discharge: 12/29/2019 (patient left AMA)  Admitting diagnosis: Severe pre-eclampsia (HA, visual changes) Gestational Age on Admission: [redacted]w[redacted]d    Secondary diagnosis:  Seizure disorder, Morbidly obese Additional problems: significant psych history     Discharge diagnosis: Preterm Pregnancy Delivered and Preeclampsia (severe)                                                                                                Post partum procedures:None  Augmentation: AROM, Pitocin, Cytotec and Foley Balloon  Complications: None  Hospital course:  Induction of Labor With Vaginal Delivery   [name] admitted to the hospital 12/27/2019 for induction of labor.  Indication for induction: Preeclampsia.  Patient had an uncomplicated labor course as follows: induced w foley bulb, cytotec, pitocin and AROM.  Membrane Rupture Time/Date:  ,   Intrapartum Procedures: Episiotomy:                                          Lacerations:    Patient had delivery of a Viable infant.  Information for the patient's newborn:  JoNala, Kachel0[[id number]]    12/28/2019  Details of delivery can be found in separate delivery note.  Patient left AMA on 2/5 for Dr. DoHulan Fray Delivery time: 3:40 PM    Magnesium Sulfate received: Yes BMZ received: Yes Rhophylac:N/A MMR:N/A Transfusion:No  Physical exam  Patient left AMA  Lab Results  Component Value Date   WBC 28.6 (H) 12/28/2019   HGB 12.0 12/28/2019   HCT 36.4 12/28/2019   MCV 92.2 12/28/2019   PLT 240 12/28/2019   CMP Latest Ref Rng & Units 12/28/2019  Glucose 70 - 99 mg/dL 118(H)  BUN 6 - 20 mg/dL 5(L)  Creatinine 0.44 - 1.00 mg/dL 0.69  Sodium 135 - 145 mmol/L 136  Potassium 3.5 - 5.1 mmol/L 3.7  Chloride 98 - 111 mmol/L 105  CO2 22 - 32 mmol/L 18(L)   Calcium 8.9 - 10.3 mg/dL 8.4(L)  Total Protein 6.5 - 8.1 g/dL 5.9(L)  Total Bilirubin 0.3 - 1.2 mg/dL 0.5  Alkaline Phos 38 - 126 U/L 109  AST 15 - 41 U/L 19  ALT 0 - 44 U/L 12    Discharge instruction: per After Visit Summary and "Baby and Me Booklet".  After visit meds:  Patient left AMA  Please schedule this patient for Postpartum visit in: 6 weeks with the following provider: MD In-Person For C/S patients schedule nurse incision check in weeks 2 weeks: no High risk pregnancy complicated by: PreE w severe features, iatrogenic preterm delivery, history of seizures on keppra, hx bipolar  Delivery mode:  SVD Anticipated Birth Control:  Nexplanon PP Procedures needed: BP check, referral to Neurology  Schedule Integrated BHDupoisit: yes   Newborn Data: Live born female  Birth Weight:  2790 grams APGAR: 8, 9  Newborn Delivery   Birth date/time: 12/28/2019 15:40:00 Delivery type:       Baby Feeding: Breast Disposition:NICU   .Erin Romans MD Attending Center for [employer] (Faculty Practice) 01/08/2020 Time: 1156am

## 2019-12-28 NOTE — Progress Notes (Signed)
Labor Progress Note Daryan Cagley is a 24 y.o. G4P2011 at [redacted]w[redacted]d presented for IOL d/t PEC w/ SF S: improved pain after epidural placement  O:  BP 121/60   Pulse 91   Temp 98.4 F (36.9 C) (Oral)   Resp 18   Ht 5\' 7"  (1.702 m)   Wt 100.3 kg   LMP 04/12/2019 (Approximate)   SpO2 98%   BMI 34.64 kg/m  EFM: 120 bpm/+accelerations, good variability/-decels   CVE: Dilation: 5 Effacement (%): 50 Station: -3 Presentation: Vertex Exam by:: [email or web address], RN   A&P: 24 y.o. 21 [redacted]w[redacted]d here for IOL d/t PEC w/ SF #Labor: FB is out and labor progressing well.  #PEC: Patient has remained normotensive except for mild elevation to 110s/90s. Headache improved after fioricet. Currently not endorsing any sx. #Pain: per patient request #FWB: Cat I #GBS unk, PCN allergy, Ancef ordered (see H&P)  [redacted]w[redacted]d, MD 7:38 AM

## 2019-12-29 ENCOUNTER — Encounter (HOSPITAL_COMMUNITY): Payer: Self-pay | Admitting: Family Medicine

## 2019-12-29 LAB — CULTURE, BETA STREP (GROUP B ONLY)

## 2019-12-29 LAB — CULTURE, OB URINE: Culture: >=100000 — AB

## 2019-12-29 MED ORDER — LACTATED RINGERS IV SOLN: INTRAVENOUS | Status: DC

## 2019-12-29 NOTE — Progress Notes (Signed)
Patient refused to continue her magnesium drip after being educated about importance/safetey by Dr. Shawnie Pons. Drip discontinued, seizure pads secured to bed, pt instructed to call RN if she senses a seizure beginning, has blurry or spotted vision, or severe headache. Call bell within reach; family member present at bedside.

## 2019-12-29 NOTE — Progress Notes (Signed)
Patient called nurse to room to discuss leaving AMA and going outside to smoke. Patient was informed that this is a smoke free campus and leaving the hospital grounds is considered leaving AMA. Safety concerns, reasons to stay, and alternative plans discussed. Patient insisted that she needed to leave for awhile, but her significant other would stay and take care of her infant while she was gone. AMA papers were signed. Dr. Marice Potter notified.

## 2019-12-29 NOTE — Clinical Social Work Maternal (Signed)
CLINICAL SOCIAL WORK MATERNAL/CHILD NOTE  Patient Details  Name: Erin Krueger MRN: [id number] Date of Birth: 07/09/1996  Date:  October 02, 2020  Clinical Social Worker Initiating Note:  Laurey Arrow Date/Time: Initiated:  12/29/19/1144     Child's Name:  Erin Krueger   Biological Parents:  Mother, Father   Need for Interpreter:  None   Reason for Referral:  Behavioral Health Concerns, Other (Comment)(No custody of older children adn extensive CPS hx.)   Address:  [place] [address]    Phone number:  [phone] (home)     Additional phone number: FOB's number is [phone]  Household Members/Support Persons (HM/SP):   Household Member/Support Person 1, Household Member/Support Person 2, Household Member/Support Person 3   HM/SP Name Relationship DOB or Age  HM/SP -1 Ravinder Lukehart Sr. FOB 02/09/2000  HM/SP -2 Destiny Clark(Per MOB, This child was placed in CPS custody and was adopted by her foster parents.) Daughter 06/23/2017  HM/SP -3 Jasper Johnson(Per MOB this child was placed in [facility] initially and then kinship placement was given to MOB's brother Ottis Stain [phone]) son 11/08/18  HM/SP -4        HM/SP -5        HM/SP -6        HM/SP -7        HM/SP -8          Natural Supports (not living in the home):  Extended Family, Parent, Friends, Immediate Family   Professional Supports: None   Employment: Disabled   Type of Work:     Education:  9 to 11 years   Homebound arranged: No  Financial Resources:  [employer]   Other Resources:  [employer], Physicist, medical    Cultural/Religious Considerations Which May Impact Care:  Per [employer], MOB is [employer].   Strengths:  Ability to meet basic needs , Home prepared for child , Understanding of illness, Pediatrician chosen   Psychotropic Medications:         Pediatrician:    [facility]  Pediatrician List:   [place]      Pediatrician Fax Number:    Risk Factors/Current Problems:  Mental Health Concerns , DHHS Involvement    Cognitive State:  Able to Concentrate , Alert , Insightful , Linear Thinking    Mood/Affect:  Calm , Happy , Interested , Comfortable    CSW Assessment: CSW met with MOB and FOB in room 107 to complete an assessment for MH hx and CPS hx.  When CSW arrived, MOB was on the phone making a pediatric follow-up appointment with [facility] and FOB was attending to infant. CSW waited to [facility] was off the phone to explain CSW's role. MOB gave CSW permission to complete clinical assessment while FOB was present. The couple appeared supportive of one another and was very attentive to infant and infant's cues while CSW completed clinical assessment.  MOB was polite, forthcoming, and was easy to engage.   CSW asked about MOB's MH hx and MOB acknowledged and extensive MH hx (since elementary age) and reported that she is not currently taking any medications.  However, MOB reported she is planning to seek inpatient and outpatient treatment from [place] after she discharges. CSW offered MOB additional resources and MOB declined reporting feeling comfortable seeking help with [facility]. MOB denied  PMAD symptoms with MOB's other 3 pregnancy however was open to education and resources. CSW provided education regarding the baby blues period vs. perinatal mood disorders, discussed treatment and gave resources for mental health follow up if concerns arise.  CSW recommends self-evaluation during the postpartum time period and encouraged MOB to contact a medical professional if symptoms are noted at any time. MOB and FOB reported having a good support team that consists of MOB's and FOB's immediate and extended family members. CSW assessed for safety and MOB denied SI and HI. MOB did not present with any acute MH symptoms during the  assessment.   CSW asked about MOB's CPS hx.  MOB openly shared that she had 2 children that were placed in CPS custody.  Per MOB, MOB's oldest daughter, Angelica Pou was placed in custody due to DV between MOB and Destiny's father.  MOB shared that [facility] took custody and MOB failed to work her plan to reunite with her child.  MOB communicated, "I was not doing what I was suppose to do, so I just made the decision to let her foster parents adopt her."  MOB reported that she communicates with foster parents and is able to receive photos of Destiny however contact with Destiny is not allowed. MOB also shared that CPS [facility]) took custody of MOB's son Tempie Donning and eventually custody was given to MOB's brother  Ottis Stain).  CSW made MOB and FOB aware that CSW will be making a report to [facility] CPS due to MOB's CPS hx; the couple was understanding. MOB expressed concerns about CPS taking custody of infant and CSW explained CPS protocol and made the couple aware that the decision to take custody will come for [facility] CPS and not from the hospital social worker. The Couple reported having all essential items to care for infant including 2 car seats and a bassinet. CSW agreed to share this information with [facility] CPS.  MOB also acknowledged having a fetal demise at 24 years of age. MOB shared "I don't know exactly what happened but the baby died when I was [redacted] weeks pregnant." MOB also talked about her children Destiny and Tempie Donning having a heart defect that could "potentially be genetic." MOB communicated, "I don't know my biological family so I don't really know much about my history. I do know that both of my kids had the same heart defect when they were born." CSW encouraged MOB to share the medical hx with the pediatrician; MOB agreed.    MOB denied the use of all illicit substances when assessed by CSW. MOB did admit to the use of alcohol around 4 weeks of pregnancy.  MOB  stated, "I didn't have no idea I was pregnant when I took that drink and I have not had anything since"  CSW provided review of Sudden Infant Death Syndrome (SIDS) precautions. The couple asked appropriate questions and responded appropriately to CSW's questions.   CSW made a report to [place] Eula Fried). There are barriers to infant discharging to MOB and FOB until CPS communicate a safety disposition plan.  CSW Plan/Description:  Sudden Infant Death Syndrome (SIDS) Education, Perinatal Mood and Anxiety Disorder (PMADs) Education, Other Patient/Family Education, Other Information/Referral to [employer], CSW Awaiting CPS Disposition Plan   Laurey Arrow, MSW, LCSW Clinical Social Work [phone]  Dimple Nanas, LCSW 12/29/2019, 11:53 AM

## 2019-12-29 NOTE — Lactation Note (Signed)
This note was copied from a baby's chart. Lactation Consultation Note  Patient Name: Erin Krueger [id number] Date: 12/29/2019  Follow up visit made.  Both mom and FOB sleeping.   Maternal Data    Feeding Feeding Type: Bottle Fed - Formula  LATCH Score                   Interventions    Lactation Tools Discussed/Used     Consult Status      Huston Foley 12/29/2019, 9:54 AM

## 2019-12-29 NOTE — Lactation Note (Addendum)
This note was copied from a baby's chart. Lactation Consultation Note Baby 11 hrs old. Mom asking for [facility] for questions. Mom told LC that baby is making funny noises while feeding and she was wondering if the baby could be Lactose intolerant because her 1st child is but her middle child isn't.  LC encouraged mom to speak to the Dr. About that but usually don't show signs of lactose intolerant right after birth. FOB holding baby trying to feed baby which had no interest. Asked if LC can assess baby feeding. LC held baby upright gave baby Gerber formula w/yellow nipple. Milk flow is to fast. Changed to purple nipple and baby done much better. Still doesn't have a lot of interest in bottle feeding. Has sensitive gag reflex. FOB asked LC to show him how to burp. LC did so. Mom stated the baby didn't have any interest in BF either.  Reported to RN of nipple change.  Patient Name: Erin Krueger [id number] Date: 12/29/2019 Reason for consult: Mother's request;Late-preterm 34-36.6wks   Maternal Data    Feeding Feeding Type: Formula Nipple Type: Extra Slow Flow(purple)  LATCH Score                   Interventions    Lactation Tools Discussed/Used     Consult Status Consult Status: Follow-up Date: 12/30/19 Follow-up type: In-patient    Sharnee Douglass, Diamond Nickel 12/29/2019, 3:08 AM

## 2019-12-29 NOTE — Progress Notes (Signed)
CSW escorted CPS worker J. Houchinson to MOB's room to complete initial safety assessment.    CPS completed assessment and per CPS, there are barriers to infant discharging to MOB and FOB.   Medical team was updated.   Blaine Hamper, MSW, LCSW Clinical Social Work [phone]

## 2019-12-29 NOTE — Progress Notes (Signed)
Post Partum Day 1 Subjective: up ad lib, voiding and doesn't feel well on Magnesium  Objective: Blood pressure 133/70, pulse 89, temperature 97.6 F (36.4 C), temperature source Oral, resp. rate 18, height 5\' 7"  (1.702 m), weight 100.3 kg, last menstrual period 04/12/2019, SpO2 98 %, unknown if currently breastfeeding.  Physical Exam:  General: alert, cooperative and appears stated age Lochia: appropriate Uterine Fundus: firm DVT Evaluation: Negative Homan's sign.  Recent Labs    12/28/19 0449 12/28/19 1439  HGB 11.8* 12.0  HCT 35.8* 36.4    Assessment/Plan: Plan for discharge tomorrow, Breastfeeding and Contraception Nexplanon in office Breast and bottle On Magnesium and Keppra--states she will stop her Magnesium, since she does not feel well and wants to go smoke. Given seizure hx, do not recommend this. BP is well controlled.   LOS: 2 days   02/25/20 12/29/2019, 8:01 AM

## 2019-12-29 NOTE — Anesthesia Postprocedure Evaluation (Signed)
Anesthesia Post Note  Patient: Erin Krueger  Procedure(s) Performed: AN AD HOC LABOR EPIDURAL     Patient location during evaluation: Mother Baby Anesthesia Type: Epidural Level of consciousness: awake and alert, oriented and patient cooperative Pain management: pain level controlled Vital Signs Assessment: post-procedure vital signs reviewed and stable Respiratory status: spontaneous breathing Cardiovascular status: stable Postop Assessment: no headache, epidural receding, patient able to bend at knees and no signs of nausea or vomiting Anesthetic complications: no Comments: Pt. States she is walking.  Pain score 4.     Last Vitals:  Vitals:   12/29/19 0705 12/29/19 0743  BP:  133/70  Pulse:  89  Resp: 18 18  Temp:  36.4 C  SpO2:  98%    Last Pain:  Vitals:   12/29/19 0800  TempSrc:   PainSc: 2    Pain Goal: Patients Stated Pain Goal: 2 (12/29/19 0800)              Epidural/Spinal Function Cutaneous sensation: Normal sensation (12/29/19 0800), Patient able to flex knees: Yes (12/29/19 0800), Patient able to lift hips off bed: Yes (12/29/19 0800), Back pain beyond tenderness at insertion site: No (12/29/19 0800), Progressively worsening motor and/or sensory loss: No (12/29/19 0800)  [facility]

## 2019-12-30 ENCOUNTER — Ambulatory Visit: Payer: Self-pay

## 2019-12-30 ENCOUNTER — Telehealth: Payer: Self-pay | Admitting: Advanced Practice Midwife

## 2019-12-30 DIAGNOSIS — N390 Urinary tract infection, site not specified: Secondary | ICD-10-CM

## 2019-12-30 MED ORDER — SULFAMETHOXAZOLE-TRIMETHOPRIM 800-160 MG PO TABS: 1.0000 | ORAL_TABLET | Freq: Two times a day (BID) | ORAL | 0 refills | Status: AC

## 2019-12-30 NOTE — Lactation Note (Signed)
This note was copied from a baby's chart. Lactation Consultation Note  Patient Name: Erin Krueger [id number] Date: 12/30/2019   Spoke to RN Misty Stanley regarding mom's discharge, she's going home today but no longer BF she's been doing bottles with Rush Barer formula the last 24 hours. Also noted that there are barriers for discharge per CPS report, mom will not be taking baby home with her after baby gets discharge, possibly on Monday. LC services no longer needed, but will page LC if there are any changes or updates.   Maternal Data    Feeding Feeding Type: Bottle Fed - Formula  LATCH Score                   Interventions    Lactation Tools Discussed/Used     Consult Status      Erin Krueger 12/30/2019, 12:26 PM

## 2019-12-30 NOTE — Telephone Encounter (Signed)
Called pt to inform her of abnormal urine culture on 12/27/19, with diagnosis of UTI.  Pt was admitted on that date and had induction of labor and was discharged today, 12/30/19.  Bactrim DS BID x 3 days sent to pt preferred pharmacy.  Reviewed s/sx of worsening infection and reasons to seek care.  Pt states understanding.

## 2019-12-31 LAB — SPECIMEN STATUS REPORT

## 2019-12-31 LAB — CULTURE, OB URINE

## 2019-12-31 LAB — URINE CULTURE, OB REFLEX

## 2020-01-01 LAB — SURGICAL PATHOLOGY

## 2020-01-01 LAB — LEVETIRACETAM LEVEL: Levetiracetam Lvl: 5.6 ug/mL — ABNORMAL LOW (ref 10.0–40.0)

## 2020-01-02 ENCOUNTER — Ambulatory Visit (HOSPITAL_COMMUNITY)
Admission: RE | Admit: 2020-01-02 | Discharge: 2020-01-02 | Disposition: A | Discharge status: Home / Self Care | Payer: Medicaid Other | Attending: Psychiatry | Admitting: Psychiatry

## 2020-01-02 DIAGNOSIS — F319 Bipolar disorder, unspecified: Secondary | ICD-10-CM | POA: Diagnosis not present

## 2020-01-02 DIAGNOSIS — Z91410 Personal history of adult physical and sexual abuse: Secondary | ICD-10-CM | POA: Diagnosis not present
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
## 2020-01-22 ENCOUNTER — Other Ambulatory Visit: Payer: Self-pay

## 2020-01-22 ENCOUNTER — Ambulatory Visit (INDEPENDENT_AMBULATORY_CARE_PROVIDER_SITE_OTHER): Payer: Medicaid Other

## 2020-01-22 ENCOUNTER — Ambulatory Visit
Admission: EM | Admit: 2020-01-22 | Discharge: 2020-01-22 | Disposition: A | Discharge status: Home / Self Care | Payer: Medicaid Other | Attending: Emergency Medicine | Admitting: Emergency Medicine

## 2020-01-22 DIAGNOSIS — M11041 Hydroxyapatite deposition disease, right hand: Secondary | ICD-10-CM

## 2020-01-22 DIAGNOSIS — S6991XA Unspecified injury of right wrist, hand and finger(s), initial encounter: Secondary | ICD-10-CM | POA: Diagnosis not present

## 2020-01-22 DIAGNOSIS — Z760 Encounter for issue of repeat prescription: Secondary | ICD-10-CM

## 2020-01-22 DIAGNOSIS — M79644 Pain in right finger(s): Secondary | ICD-10-CM

## 2020-01-22 IMAGING — DX DG HAND COMPLETE 3+V*R*
3 series · 3 of 3 positions shown · non-contrast
Comparison: None.

CLINICAL DATA: Right middle finger injury

EXAM:
RIGHT HAND - COMPLETE 3+ VIEW

[hand lat]
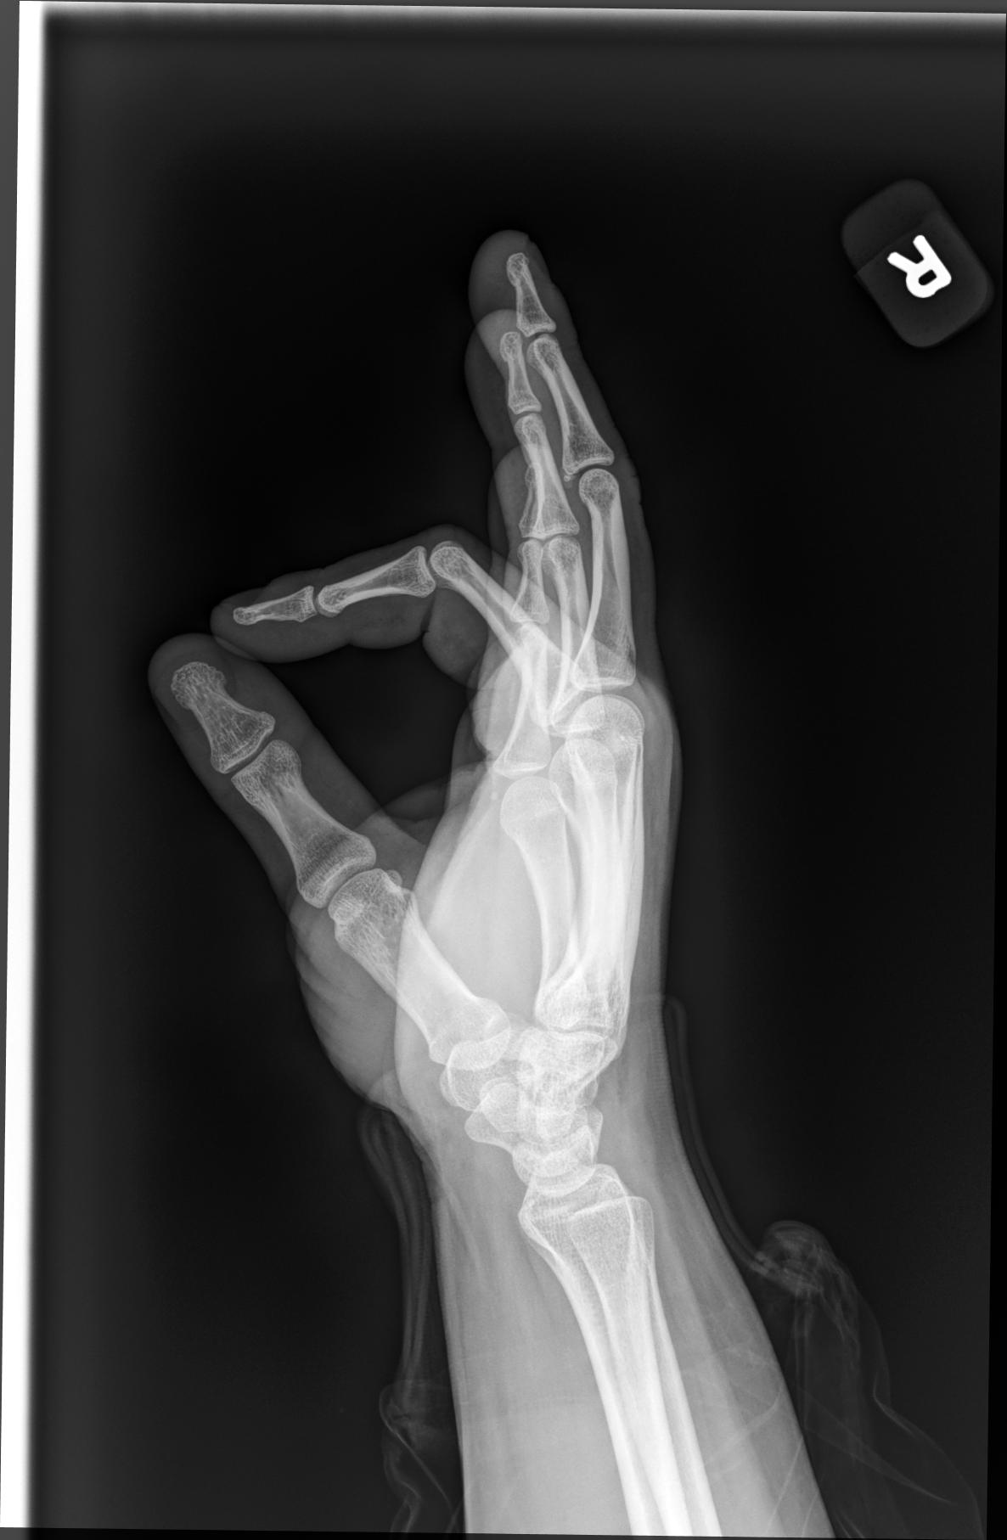

[hand mlo]
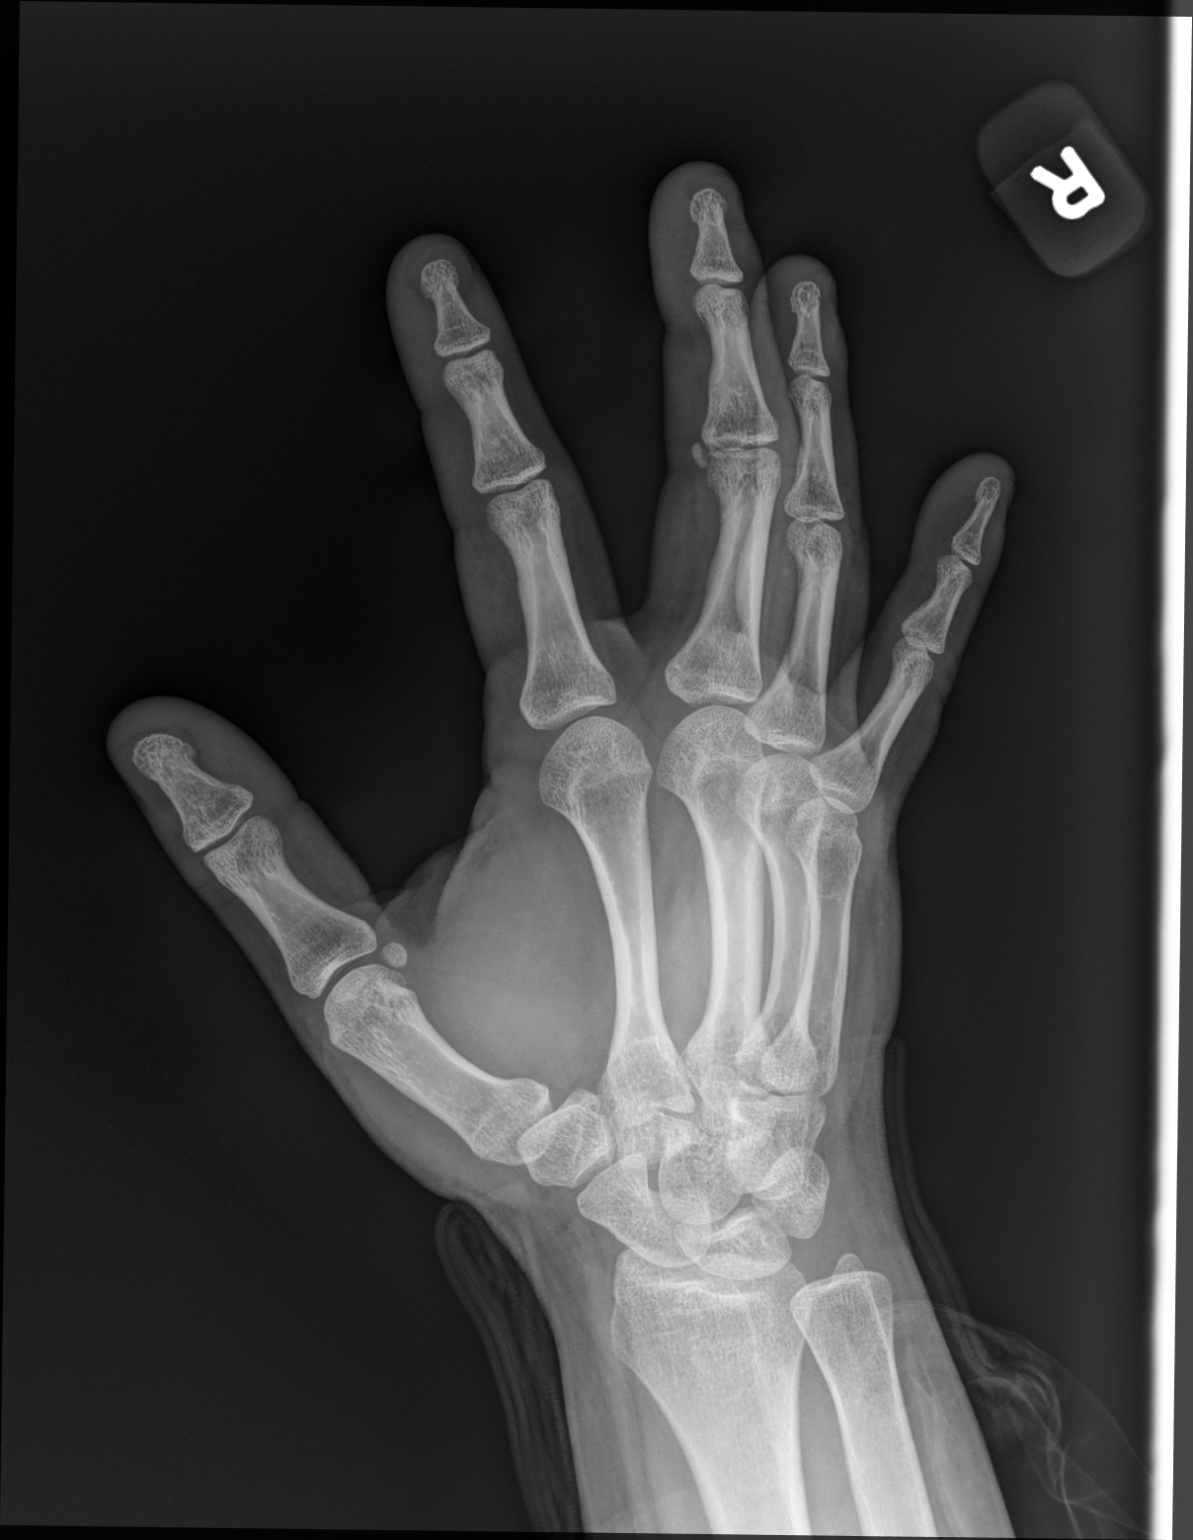

[hand pa]
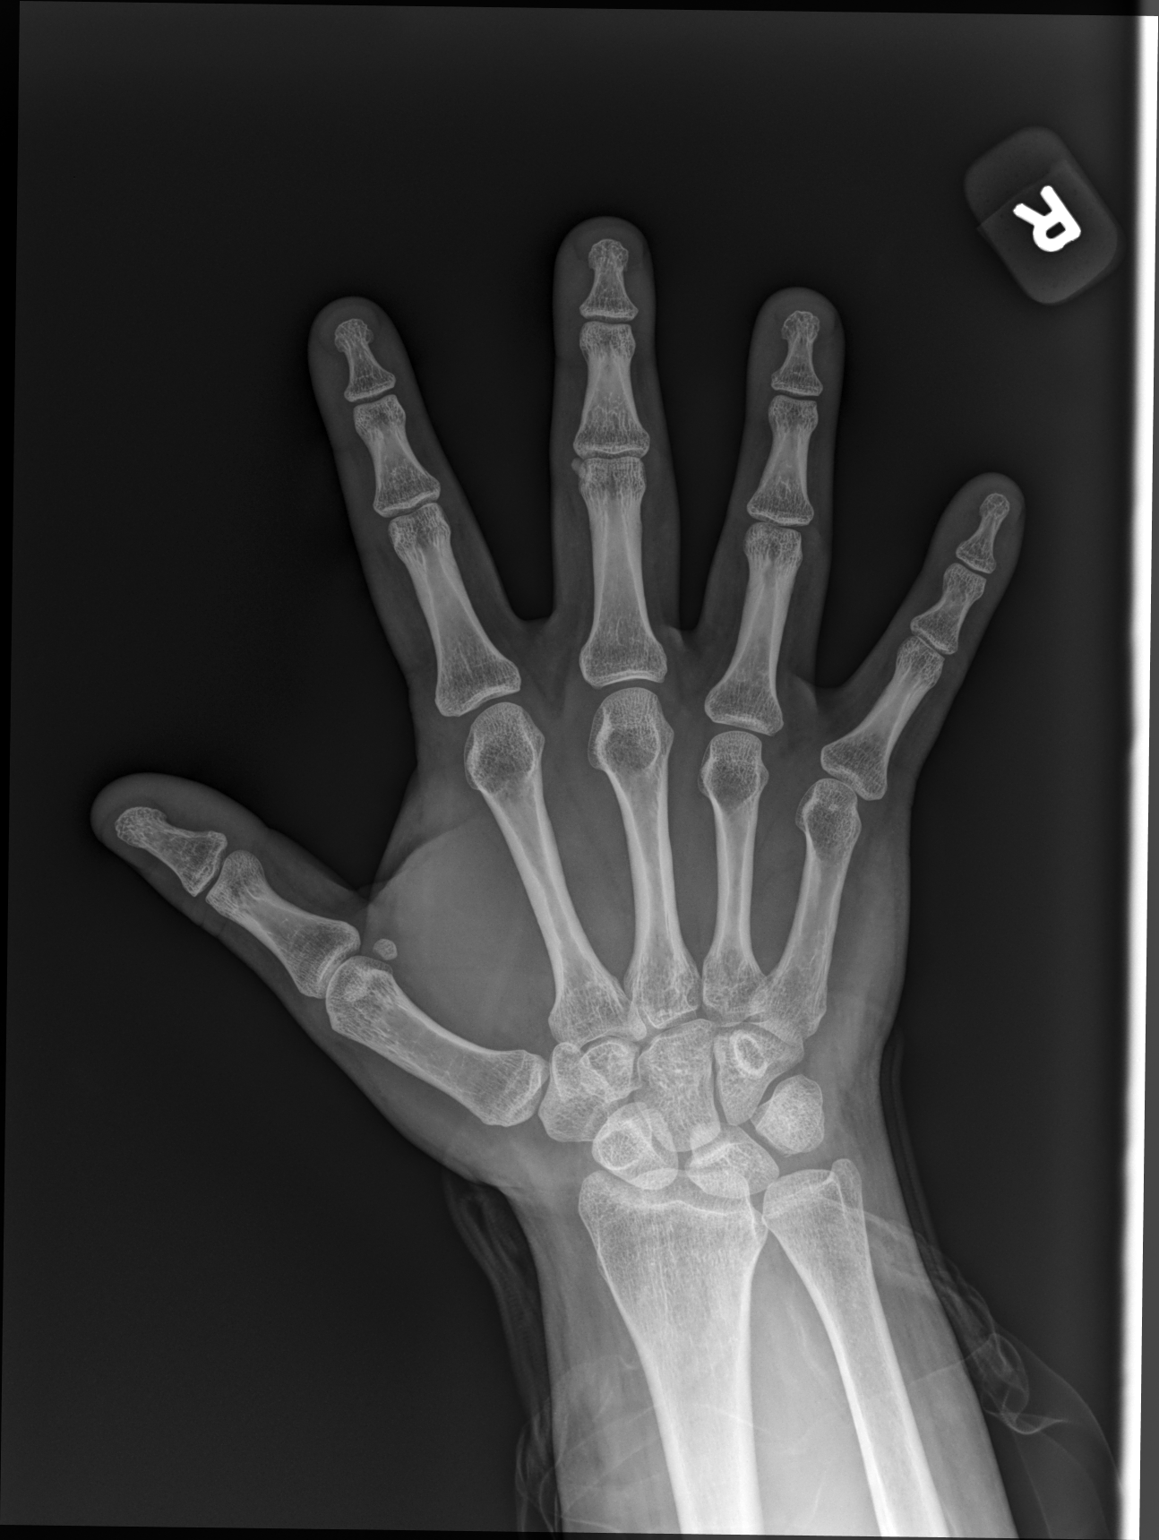

[3 of 3 positions shown; findings below may reference images not displayed]

FINDINGS: Ovoid, smoothly marginated mineralization along the radial aspect of
the third proximal interphalangeal joint does not appear to reflect
a fracture frag given the appearance and configuration and lack of
clear donor site. Appearance suggests a degenerative periarticular
calcification such as with hydroxyapatite deposition. No acute
fracture or traumatic malalignment is seen. Minimal soft tissue
swelling is present. No soft tissue gas or foreign body.
IMPRESSION: 1. No definite acute fracture or traumatic malalignment seen.
2. Periarticular mineralization of the third proximal
interphalangeal joint favor periarticular mineralization such as
hydroxyapatite deposition.
3. Minimal soft tissue swelling of the third digit.

## 2020-01-22 MED ORDER — LEVETIRACETAM 1000 MG PO TABS: 1000.0000 mg | ORAL_TABLET | Freq: Two times a day (BID) | ORAL | 0 refills | Status: DC

## 2020-01-22 MED ORDER — IBUPROFEN 800 MG PO TABS: 800.0000 mg | ORAL_TABLET | Freq: Three times a day (TID) | ORAL | 0 refills | Status: DC

## 2020-01-22 NOTE — Discharge Instructions (Signed)
X-ray concerning for "hydroxyapatite deposition" which may be a form of pseudogout Splint placed Continue conservative management of rest, ice, and elevation Take ibuprofen as needed for pain relief (may cause abdominal discomfort, ulcers, and GI bleeds avoid taking with other NSAIDs) Follow up with PCP if symptoms persist Return or go to the ER if you have any new or worsening symptoms (fever, chills, chest pain, swelling, redness, worsening symptoms despite medication, etc...)

## 2020-01-22 NOTE — ED Triage Notes (Signed)
Pt presents to UC w/ c/o right middle finger pain since yesterday. Pt states she was cleaning vigorously yesterday and it started hurting. Unsure of how she hurt her finger. Unable to bend middle finger.

## 2020-01-22 NOTE — ED Provider Notes (Addendum)
[place]   [id number] 01/22/20 Arrival Time: 2355  CC: RT middle finger pain  SUBJECTIVE: History from: patient. Erin Krueger is a 24 y.o. female complains of right middle finger pain x 1 day.  Symptoms began after cleaning "vigorously."  Localizes the pain to the RT middle finger.  Describes the pain as constant and achy in character.  8/10.  Has tried OTC medications without relief.  Symptoms are made worse with flexion.  Denies similar symptoms in the past.  Denies fever, chills, erythema, ecchymosis, effusion, weakness, numbness and tingling.  Also requests medication refill for keppra.  Takes keppra for seizures.    ROS: As per HPI.  All other pertinent ROS negative.     Past Medical History:  Diagnosis Date  . ADHD (attention deficit hyperactivity disorder)   . Allergy   . Anxiety   . Asthma   . Eating disorder   . Headache(784.0)   . Kidney stone    kidney stones  . Migraines   . ODD (oppositional defiant disorder)   . PID (acute pelvic inflammatory disease) 01/29/2016  . PTSD (post-traumatic stress disorder)   . Seizures ([place])    diagnosed age 73   Past Surgical History:  Procedure Laterality Date  . right knee surgery    . TONSILLECTOMY AND ADENOIDECTOMY     Allergies  Allergen Reactions  . Bee Venom Anaphylaxis  . Cashew Nut Oil Anaphylaxis  . Penicillins Anaphylaxis    Has patient had a PCN reaction causing immediate rash, facial/tongue/throat swelling, SOB or lightheadedness with hypotension: Unknown Has patient had a PCN reaction causing severe rash involving mucus membranes or skin necrosis: Unknown Has patient had a PCN reaction that required hospitalization: Unknown Has patient had a PCN reaction occurring within the last 10 years: Unknown If all of the above answers are "NO", then may proceed with Cephalosporin use.   . Ativan [Lorazepam]     Aggressive-agitation  . Lodine [Etodolac] Nausea And Vomiting and Other (See Comments)    Patient  states had vomiting when taking medication, and threw up blood; also complained of severe headaches while on medication  . Prednisone Other (See Comments)    Bleeding nose bleeding and internal bleeding  . Cocoa Butter Rash  . Latex Rash   No current facility-administered medications on file prior to encounter.   Current Outpatient Medications on File Prior to Encounter  Medication Sig Dispense Refill  . anti-nausea (EMETROL) solution Take 15 mLs by mouth once as needed for nausea or vomiting.    Marland Kitchen guaiFENesin (MUCINEX) 600 MG 12 hr tablet Take 1 tablet (600 mg total) by mouth 2 (two) times daily. 60 tablet 1  . Prenatal Vit-Fe Fumarate-FA (PRENATAL MULTIVITAMIN) TABS tablet Take 1 tablet by mouth daily at 12 noon.    . [DISCONTINUED] albuterol (PROVENTIL HFA;VENTOLIN HFA) 108 (90 Base) MCG/ACT inhaler Inhale 2 puffs into the lungs every 6 (six) hours as needed for wheezing or shortness of breath. (Patient not taking: Reported on 11/22/2019) 1 Inhaler 2  . [DISCONTINUED] metoCLOPramide (REGLAN) 10 MG tablet Take 1 tablet (10 mg total) by mouth every 6 (six) hours as needed for nausea. 60 tablet 1   Social History   Socioeconomic History  . Marital status: Single    Spouse name: Not on file  . Number of children: 2  . Years of education: Not on file  . Highest education level: Not on file  Occupational History  . Occupation: disabled has epilepsy   Tobacco  Use  . Smoking status: Former Smoker    Packs/day: 0.50    Types: Cigarettes, E-cigarettes  . Smokeless tobacco: Never Used  . Tobacco comment: nicotine patch  Substance and Sexual Activity  . Alcohol use: Not Currently  . Drug use: Not Currently    Types: Marijuana    Comment: Not since 02/13  . Sexual activity: Yes    Birth control/protection: None  Other Topics Concern  . Not on file  Social History Narrative  . Not on file   Social Determinants of Health   Financial Resource Strain:   . Difficulty of Paying Living  Expenses: Not on file  Food Insecurity:   . Worried About Programme researcher, broadcasting/film/video in the Last Year: Not on file  . Ran Out of Food in the Last Year: Not on file  Transportation Needs:   . Lack of Transportation (Medical): Not on file  . Lack of Transportation (Non-Medical): Not on file  Physical Activity:   . Days of Exercise per Week: Not on file  . Minutes of Exercise per Session: Not on file  Stress:   . Feeling of Stress : Not on file  Social Connections:   . Frequency of Communication with Friends and Family: Not on file  . Frequency of Social Gatherings with Friends and Family: Not on file  . Attends Religious Services: Not on file  . Active Member of Clubs or Organizations: Not on file  . Attends Banker Meetings: Not on file  . Marital Status: Not on file  Intimate Partner Violence:   . Fear of Current or Ex-Partner: Not on file  . Emotionally Abused: Not on file  . Physically Abused: Not on file  . Sexually Abused: Not on file   Family History  Problem Relation Age of Onset  . Diabetes Mother   . Diabetes Sister   . Breast cancer Maternal Grandmother   . Diabetes Maternal Grandmother   . Glaucoma Maternal Grandmother   . Other Son        born with hole in heart  . Other Daughter        born with hole in heart    OBJECTIVE:  Vitals:   01/22/20 1646 01/22/20 1650  BP:  105/75  Pulse: 74   Resp: 16   Temp: 98.8 F (37.1 C)   TempSrc: Oral   SpO2: 97%     General appearance: ALERT; in no acute distress.  Head: NCAT Lungs: Normal respiratory effort CV: Radial pulse 2+. Cap refill < 2 seconds Musculoskeletal: RT hand Inspection: Skin warm, dry, clear and intact without obvious erythema, effusion, or ecchymosis.  Palpation: TTP over PIP joint of 3rd digit ROM: LROM Strength: deferred Skin: warm and dry Neurologic: Ambulates without difficulty; Sensation intact about the upper extremities Psychological: alert and cooperative; normal mood and  affect  DIAGNOSTIC STUDIES:  DG Hand Complete Right  Result Date: 01/22/2020 CLINICAL DATA:  Right middle finger injury EXAM: RIGHT HAND - COMPLETE 3+ VIEW COMPARISON:  None. FINDINGS: Ovoid, smoothly marginated mineralization along the radial aspect of the third proximal interphalangeal joint does not appear to reflect a fracture frag given the appearance and configuration and lack of clear donor site. Appearance suggests a degenerative periarticular calcification such as with hydroxyapatite deposition. No acute fracture or traumatic malalignment is seen. Minimal soft tissue swelling is present. No soft tissue gas or foreign body. IMPRESSION: 1. No definite acute fracture or traumatic malalignment seen. 2. Periarticular mineralization of the  third proximal interphalangeal joint favor periarticular mineralization such as hydroxyapatite deposition. 3. Minimal soft tissue swelling of the third digit. Electronically Signed   By: Kreg Shropshire M.D.   On: 01/22/2020 17:19    X-rays negative for acute fracture or dislocation  I have reviewed the x-rays myself and the radiologist interpretation. I am in agreement with the radiologist interpretation.     ASSESSMENT & PLAN:  1. 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 Medication refill    Meds ordered this encounter  Medications  . levETIRAcetam (KEPPRA) 1000 MG tablet    Sig: Take 1 tablet (1,000 mg total) by mouth 2 (two) times daily.    Dispense:  60 tablet    Refill:  0    Order Specific Question:   Supervising Provider    Answer:   Eustace Moore [[id number]]  . ibuprofen (ADVIL) 800 MG tablet    Sig: Take 1 tablet (800 mg total) by mouth 3 (three) times daily.    Dispense:  21 tablet    Refill:  0    Order Specific Question:   Supervising Provider    Answer:   Eustace Moore [[id number]]   X-ray concerning for "hydroxyapatite deposition" which may be a form of pseudogout Splint  placed Continue conservative management of rest, ice, and elevation Take ibuprofen as needed for pain relief (may cause abdominal discomfort, ulcers, and GI bleeds avoid taking with other NSAIDs) Follow up with PCP if symptoms persist Return or go to the ER if you have any new or worsening symptoms (fever, chills, chest pain, swelling, redness, worsening symptoms despite medication, etc...)   Keppra refilled.  Takes for epilepsy.  Given hard copy of Rx.  Is currently breast feeding.  Recommended patient follow up with OB/ pediatrician prior to taking medication.    Reviewed expectations re: course of current medical issues. Questions answered. Outlined signs and symptoms indicating need for more acute intervention. Patient verbalized understanding. After Visit Summary given.    Rennis Harding, PA-C 01/22/20 1902    Rennis Harding, PA-C 01/22/20 1903

## 2020-01-28 ENCOUNTER — Inpatient Hospital Stay (HOSPITAL_COMMUNITY): Admit: 2020-01-28 | Payer: Self-pay

## 2020-02-01 ENCOUNTER — Ambulatory Visit (INDEPENDENT_AMBULATORY_CARE_PROVIDER_SITE_OTHER): Payer: Medicaid Other | Admitting: Advanced Practice Midwife

## 2020-02-01 ENCOUNTER — Other Ambulatory Visit: Payer: Self-pay

## 2020-02-01 ENCOUNTER — Encounter: Payer: Self-pay | Admitting: Advanced Practice Midwife

## 2020-02-01 DIAGNOSIS — Z3202 Encounter for pregnancy test, result negative: Secondary | ICD-10-CM

## 2020-02-01 DIAGNOSIS — Z1389 Encounter for screening for other disorder: Secondary | ICD-10-CM | POA: Diagnosis not present

## 2020-02-01 DIAGNOSIS — Z331 Pregnant state, incidental: Secondary | ICD-10-CM

## 2020-02-01 LAB — POCT URINE PREGNANCY: Preg Test, Ur: NEGATIVE

## 2020-02-01 NOTE — Progress Notes (Signed)
Erin Krueger is a 24 y.o. who presents for a postpartum visit. She is 6 weeks postpartum following a spontaneous vaginal delivery. I have fully reviewed the prenatal and intrapartum course. The delivery was at 35.4 gestational weeks. IOL for PreE w/HA/scotoma.  Anesthesia: epidural. Postpartum course has been uneventful.She did not require antihypertensives. Baby's course has been uneventful. Baby is feeding by formula. Bleeding: had period last week. Stopped 2 days ago. Bowel function is normal. Bladder function is normal. Patient is not sexually active. Contraception method is abstinence. Postpartum depression screening: negative. She has been seen by Behavioral Health postpartum    Current Outpatient Medications:  .  ibuprofen (ADVIL) 800 MG tablet, Take 1 tablet (800 mg total) by mouth 3 (three) times daily., Disp: 21 tablet, Rfl: 0 .  levETIRAcetam (KEPPRA) 1000 MG tablet, Take 1 tablet (1,000 mg total) by mouth 2 (two) times daily., Disp: 60 tablet, Rfl: 0  Review of Systems   Constitutional: Negative for fever and chills Eyes: Negative for visual disturbances Respiratory: Negative for shortness of breath, dyspnea Cardiovascular: Negative for chest pain or palpitations  Gastrointestinal: Negative for vomiting, diarrhea and constipation Genitourinary: Negative for dysuria and urgency Musculoskeletal: Negative for back pain, joint pain, myalgias  Neurological: Negative for dizziness and headaches    Objective:     Vitals:   02/01/20 1338  BP: 123/88   General:  alert, cooperative and no distress   Breasts:  negative  Lungs: Normal respiratory effort  Heart:  regular rate and rhythm  Abdomen: Soft, nontender   Vulva:  normal  Vagina: normal vagina  Cervix:  closed  Corpus: Well involuted     Rectal Exam: no hemorrhoids        Assessment:    normal postpartum exam. S/P PreE w/severe features (HA) Plan:   1. Contraception: Nexplanon 2. Follow up in:  asap for  nexplanon.  No sex until then.

## 2020-02-02 ENCOUNTER — Encounter: Payer: Self-pay | Admitting: Adult Health

## 2020-02-02 ENCOUNTER — Ambulatory Visit (INDEPENDENT_AMBULATORY_CARE_PROVIDER_SITE_OTHER): Payer: Medicaid Other | Admitting: Adult Health

## 2020-02-02 ENCOUNTER — Other Ambulatory Visit: Payer: Self-pay

## 2020-02-02 VITALS — BP 109/79 | HR 87 | Ht 67.0 in | Wt 204.0 lb

## 2020-02-02 DIAGNOSIS — Z30017 Encounter for initial prescription of implantable subdermal contraceptive: Secondary | ICD-10-CM | POA: Insufficient documentation

## 2020-02-02 DIAGNOSIS — Z3202 Encounter for pregnancy test, result negative: Secondary | ICD-10-CM

## 2020-02-02 LAB — POCT URINE PREGNANCY: Preg Test, Ur: NEGATIVE

## 2020-02-02 MED ORDER — ETONOGESTREL 68 MG ~~LOC~~ IMPL
68.0000 mg | DRUG_IMPLANT | Freq: Once | SUBCUTANEOUS | Status: AC
  Administered 2020-02-02: 68 mg via SUBCUTANEOUS

## 2020-02-02 NOTE — Patient Instructions (Signed)
Use condoms x 2 weeks, keep clean and dry x 24 hours, no heavy lifting, keep steri strips on x 72 hours, Keep pressure dressing on x 24 hours. Follow up prn problems.  

## 2020-02-02 NOTE — Addendum Note (Signed)
Addended by: Colen Darling on: 02/02/2020 11:48 AM   Modules accepted: Orders

## 2020-02-02 NOTE — Progress Notes (Signed)
  Subjective:     Patient ID: [name], female   DOB: [birth year], 24 y.o.   MRN: [id number]  HPI Bettylou is a 24 year old white female, [phone], had postpartum exam yesterday in for nexplanon insertion. No sex since delivery.  Review of Systems For nexplanon insertion Reviewed past medical,surgical, social and family history. Reviewed medications and allergies.     Objective:   Physical Exam BP 109/79 (BP Location: Left Arm, Patient Position: Sitting, Cuff Size: Large)   Pulse 87   Ht 5\' 7"  (1.702 m)   Wt 204 lb (92.5 kg)   LMP 01/05/2020   Breastfeeding No   BMI 31.95 kg/m UPT negative Consent signed, time out called. Left arm cleansed with betadine, and injected with 2.5 cc 1% lidocaine and waited til numb,she did have some bruising,but says she bruises easily. Nexplanon easily inserted and steri strips applied.Rod easily palpated by provider and pt. Pressure dressing applied.    Assessment:     1. Pregnancy examination or test, negative result  2. Nexplanon insertion Lot # 03/04/2020 Exp: [id number]    Plan:     Use condoms x 2 weeks, keep clean and dry x 24 hours, no heavy lifting, keep steri strips on x 72 hours, Keep pressure dressing on x 24 hours. Follow up prn problems. For removal in 3 years or sooner if desired

## 2020-04-15 ENCOUNTER — Ambulatory Visit: Payer: Medicaid Other | Admitting: Obstetrics and Gynecology

## 2020-04-24 ENCOUNTER — Ambulatory Visit: Payer: Medicaid Other | Admitting: Obstetrics and Gynecology

## 2020-06-21 ENCOUNTER — Emergency Department (HOSPITAL_COMMUNITY): Payer: Medicaid Other

## 2020-06-21 ENCOUNTER — Encounter (HOSPITAL_COMMUNITY): Payer: Self-pay

## 2020-06-21 ENCOUNTER — Emergency Department (HOSPITAL_COMMUNITY)
Admission: EM | Admit: 2020-06-21 | Discharge: 2020-06-21 | Discharge status: Against Medical Advice | Payer: Medicaid Other | Attending: Internal Medicine | Admitting: Internal Medicine

## 2020-06-21 DIAGNOSIS — R569 Unspecified convulsions: Secondary | ICD-10-CM

## 2020-06-21 DIAGNOSIS — Z20822 Contact with and (suspected) exposure to covid-19: Secondary | ICD-10-CM | POA: Diagnosis not present

## 2020-06-21 DIAGNOSIS — G40909 Epilepsy, unspecified, not intractable, without status epilepticus: Secondary | ICD-10-CM | POA: Diagnosis present

## 2020-06-21 DIAGNOSIS — Z79899 Other long term (current) drug therapy: Secondary | ICD-10-CM | POA: Diagnosis not present

## 2020-06-21 DIAGNOSIS — J45909 Unspecified asthma, uncomplicated: Secondary | ICD-10-CM | POA: Diagnosis present

## 2020-06-21 DIAGNOSIS — F319 Bipolar disorder, unspecified: Secondary | ICD-10-CM | POA: Diagnosis not present

## 2020-06-21 DIAGNOSIS — F4310 Post-traumatic stress disorder, unspecified: Secondary | ICD-10-CM | POA: Insufficient documentation

## 2020-06-21 DIAGNOSIS — F17210 Nicotine dependence, cigarettes, uncomplicated: Secondary | ICD-10-CM | POA: Insufficient documentation

## 2020-06-21 DIAGNOSIS — Z91040 Latex allergy status: Secondary | ICD-10-CM | POA: Insufficient documentation

## 2020-06-21 DIAGNOSIS — G40901 Epilepsy, unspecified, not intractable, with status epilepticus: Secondary | ICD-10-CM

## 2020-06-21 LAB — COMPREHENSIVE METABOLIC PANEL
ALT: 12 U/L (ref 0–44)
AST: 14 U/L — ABNORMAL LOW (ref 15–41)
Albumin: 4.8 g/dL (ref 3.5–5.0)
Alkaline Phosphatase: 71 U/L (ref 38–126)
Anion gap: 12 (ref 5–15)
BUN: 9 mg/dL (ref 6–20)
CO2: 19 mmol/L — ABNORMAL LOW (ref 22–32)
Calcium: 9.7 mg/dL (ref 8.9–10.3)
Chloride: 112 mmol/L — ABNORMAL HIGH (ref 98–111)
Creatinine, Ser: 0.81 mg/dL (ref 0.44–1.00)
GFR calc Af Amer: >60 mL/min (ref >60)
GFR calc non Af Amer: >60 mL/min (ref >60)
Glucose, Bld: 84 mg/dL (ref 70–99)
Potassium: 3.7 mmol/L (ref 3.5–5.1)
Sodium: 143 mmol/L (ref 135–145)
Total Bilirubin: 0.5 mg/dL (ref 0.3–1.2)
Total Protein: 8 g/dL (ref 6.5–8.1)

## 2020-06-21 LAB — URINALYSIS, ROUTINE W REFLEX MICROSCOPIC
Bilirubin Urine: NEGATIVE
Glucose, UA: NEGATIVE mg/dL
Hgb urine dipstick: NEGATIVE
Ketones, ur: NEGATIVE mg/dL
Nitrite: POSITIVE — AB
Protein, ur: 30 mg/dL — AB
Specific Gravity, Urine: 1.026 (ref 1.005–1.030)
pH: 8 (ref 5.0–8.0)

## 2020-06-21 LAB — CBG MONITORING, ED
Glucose-Capillary: 84 mg/dL (ref 70–99)
Glucose-Capillary: 88 mg/dL (ref 70–99)

## 2020-06-21 LAB — CBC
HCT: 44.4 % (ref 36.0–46.0)
Hemoglobin: 14.7 g/dL (ref 12.0–15.0)
MCH: 29.3 pg (ref 26.0–34.0)
MCHC: 33.1 g/dL (ref 30.0–36.0)
MCV: 88.6 fL (ref 80.0–100.0)
Platelets: 221 10*3/uL (ref 150–400)
RBC: 5.01 MIL/uL (ref 3.87–5.11)
RDW: 13.3 % (ref 11.5–15.5)
WBC: 9.4 10*3/uL (ref 4.0–10.5)
nRBC: 0 % (ref 0.0–0.2)

## 2020-06-21 LAB — SARS CORONAVIRUS 2 BY RT PCR (HOSPITAL ORDER, PERFORMED IN ~~LOC~~ HOSPITAL LAB): SARS Coronavirus 2: NEGATIVE

## 2020-06-21 LAB — MAGNESIUM: Magnesium: 1.9 mg/dL (ref 1.7–2.4)

## 2020-06-21 LAB — ETHANOL: Alcohol, Ethyl (B): <10 mg/dL (ref <10)

## 2020-06-21 LAB — PHOSPHORUS: Phosphorus: 2.3 mg/dL — ABNORMAL LOW (ref 2.5–4.6)

## 2020-06-21 LAB — RAPID URINE DRUG SCREEN, HOSP PERFORMED
Amphetamines: NOT DETECTED
Barbiturates: NOT DETECTED
Benzodiazepines: POSITIVE — AB
Cocaine: NOT DETECTED
Opiates: NOT DETECTED
Tetrahydrocannabinol: NOT DETECTED

## 2020-06-21 LAB — PREGNANCY, URINE: Preg Test, Ur: NEGATIVE

## 2020-06-21 IMAGING — CT CT HEAD W/O CM
3 series · 16 of 47 positions shown, 19 images · non-contrast
Comparison: Prior head CT examinations [DATE] and earlier

CLINICAL DATA: Seizure, abnormal neuro exam. Additional provided:
Seizure prior to arrival, history of seizures, unresponsive.

EXAM:
CT HEAD WITHOUT CONTRAST
TECHNIQUE: Contiguous axial images were obtained from the base of the skull
through the vertex without intravenous contrast.

[Series 3: head w o · axial · 0.42mm/px · z∈[-89,+41]mm · 10 of 32 slices shown, 13 images]
[im 3/32  brain]
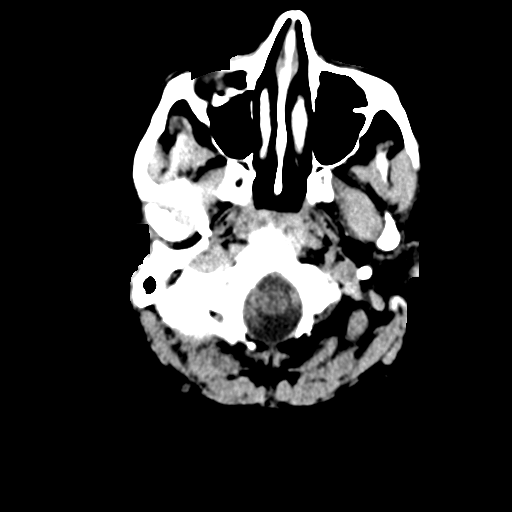
[im 3/32  bone]
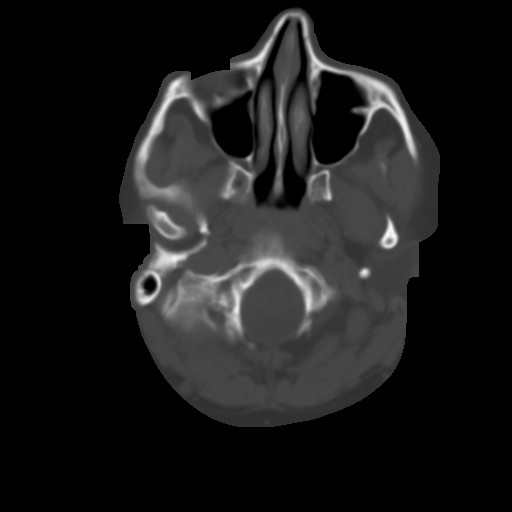
[im 6/32  brain]
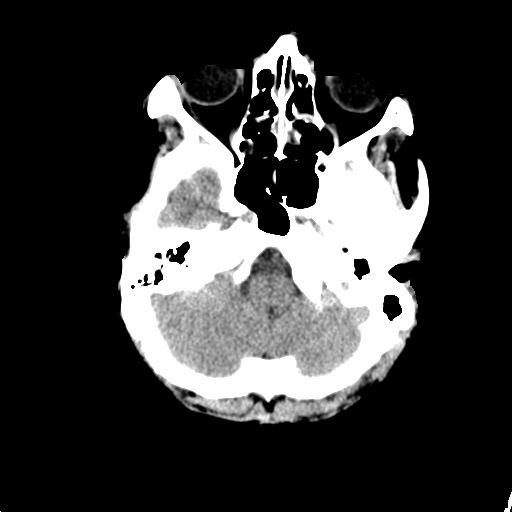
[im 9/32  brain]
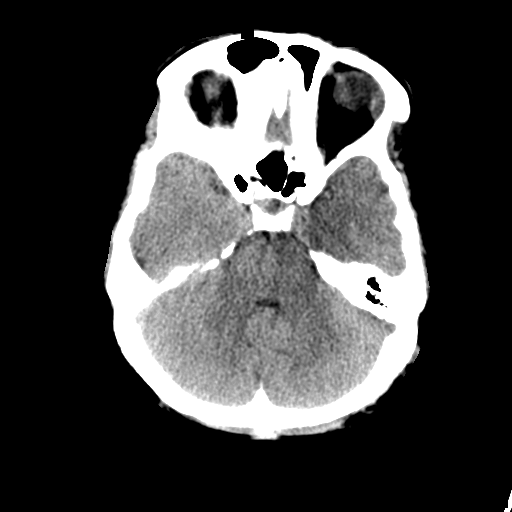
[im 11/32  brain]
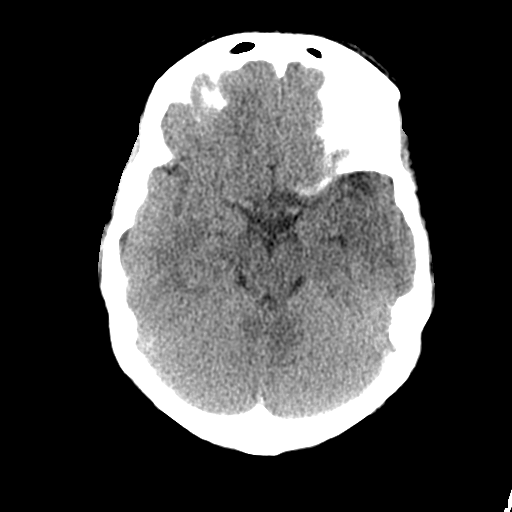
[im 14/32  brain]
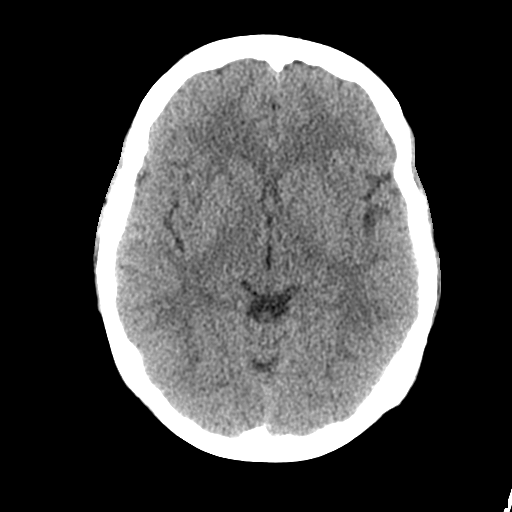
[im 14/32  bone]
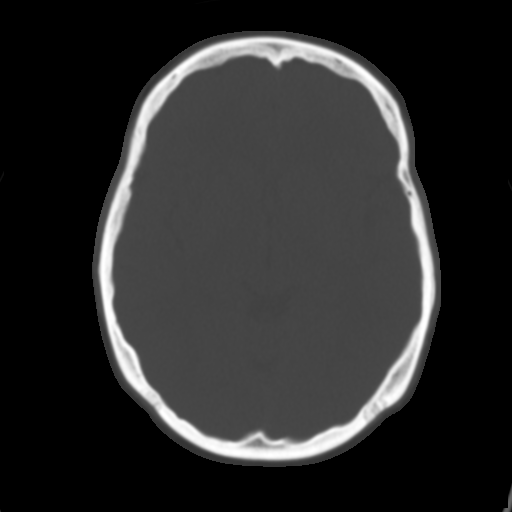
[im 18/32  brain]
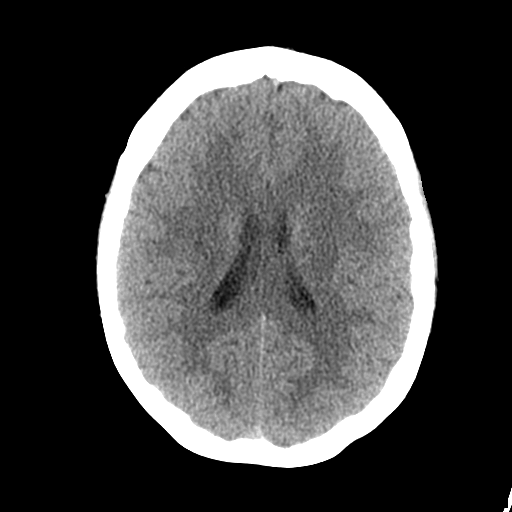
[im 21/32  brain]
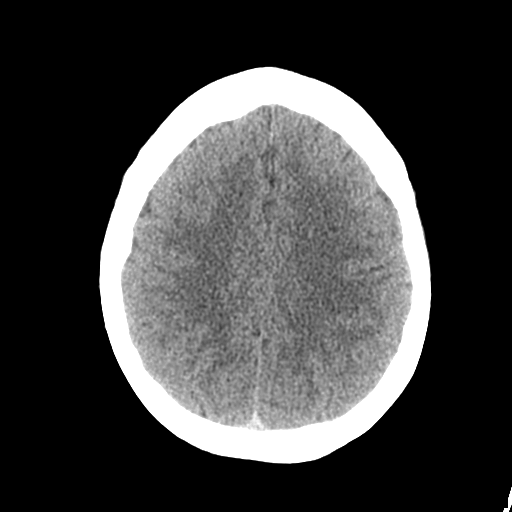
[im 24/32  brain]
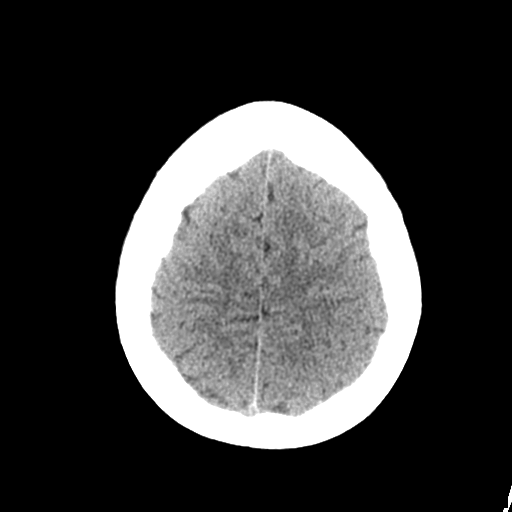
[im 26/32  brain]
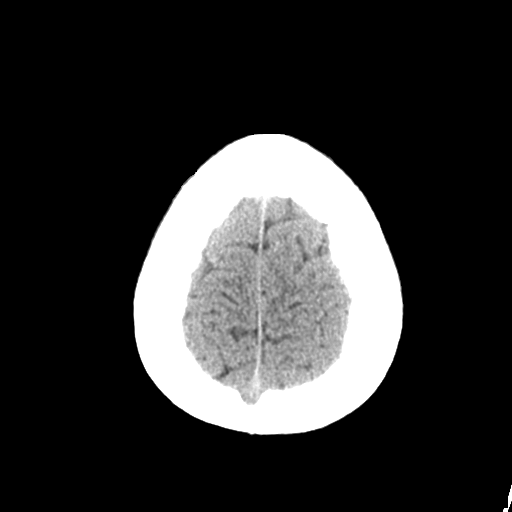
[im 26/32  bone]
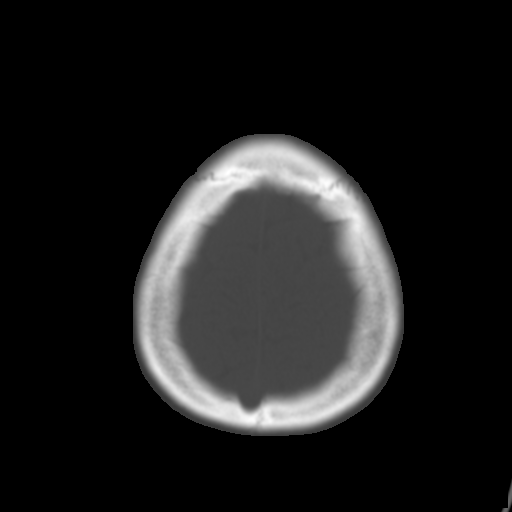
[im 29/32  brain]
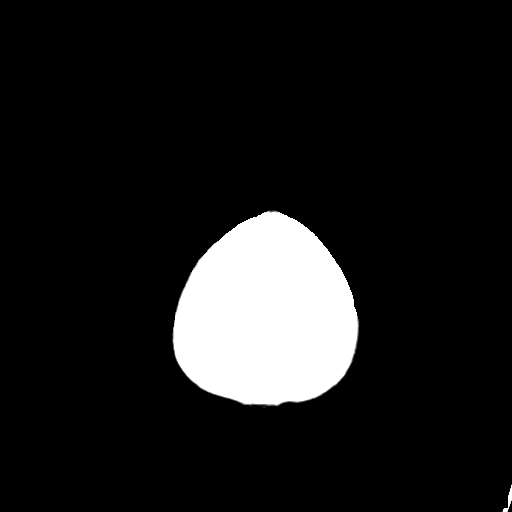

[Series 4: coronal soft · coronal · 0.32mm/px · 3 of 68 slices shown]
[im 23/68  brain]
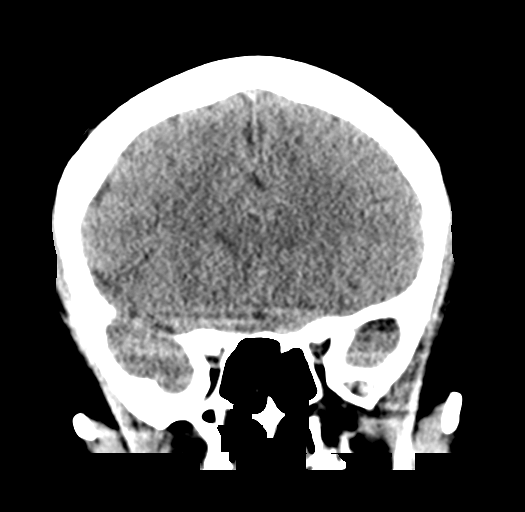
[im 30/68  brain]
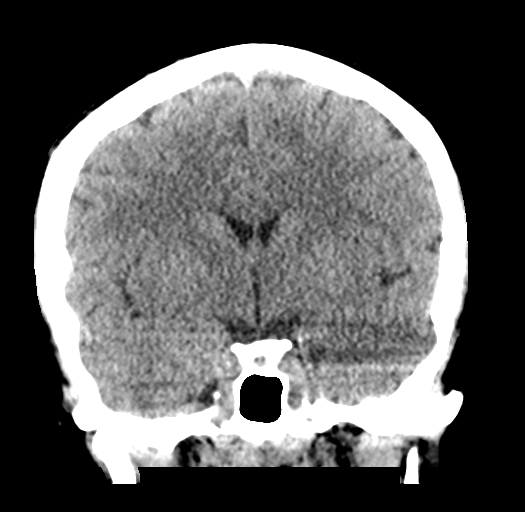
[im 38/68  brain]
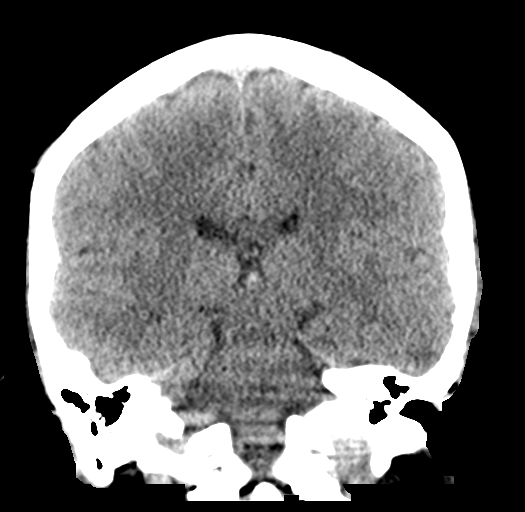

[Series 5: sagittal soft · sagittal · 0.35mm/px · 3 of 57 slices shown]
[im 19/57  brain]
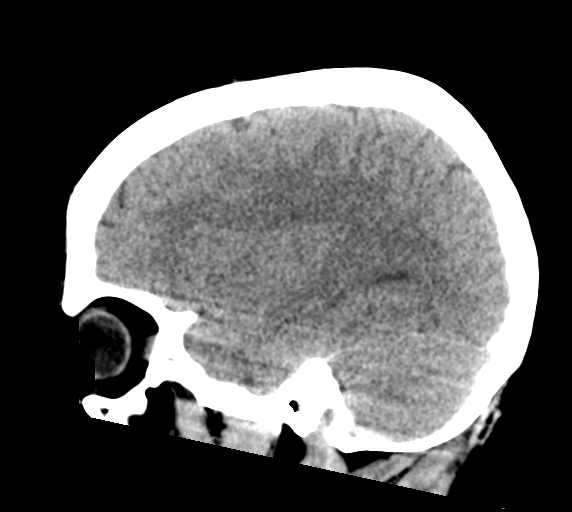
[im 29/57  brain]
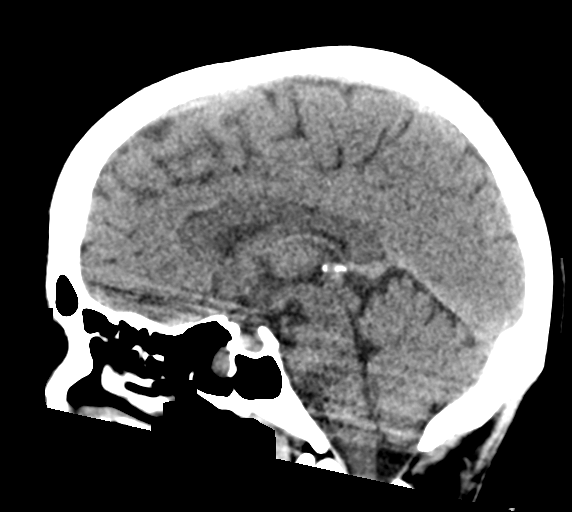
[im 38/57  brain]
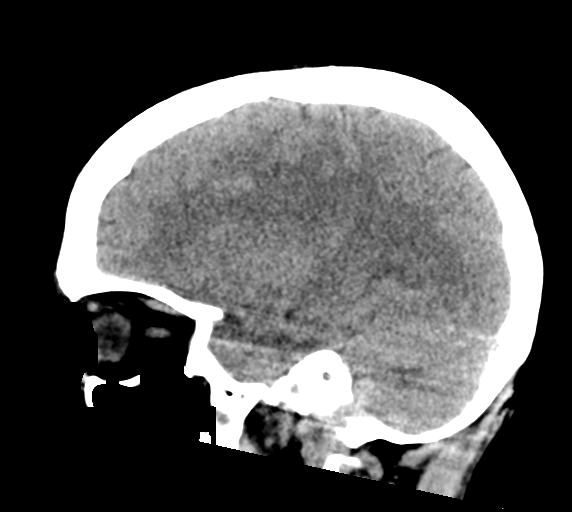

[16 of 47 positions shown; findings below may reference images not displayed]

FINDINGS: Brain:

Cerebral volume is normal.

There is no acute intracranial hemorrhage.

No demarcated cortical infarct.

No extra-axial fluid collection.

No evidence of intracranial mass.

No midline shift.

Vascular: No hyperdense vessel.

Skull: Normal. Negative for fracture or focal lesion.

Sinuses/Orbits: Visualized orbits show no acute finding. Moderate
ethmoid sinus mucosal thickening. Scattered small mucous retention
cysts within the ethmoid air cells, right sphenoid sinus and
bilateral maxillary sinuses. No significant mastoid effusion.
IMPRESSION: No CT evidence of acute intracranial abnormality.

Moderate ethmoid sinus mucosal thickening. Small mucous retention
cysts within the ethmoid, right sphenoid and bilateral maxillary
sinuses.

## 2020-06-21 MED ORDER — NICOTINE 7 MG/24HR TD PT24: 7.0000 mg | MEDICATED_PATCH | Freq: Every day | TRANSDERMAL | Status: DC

## 2020-06-21 MED ORDER — KCL IN DEXTROSE-NACL 20-5-0.45 MEQ/L-%-% IV SOLN
INTRAVENOUS | Status: DC
  Filled 2020-06-21 (×2): qty 1000

## 2020-06-21 MED ORDER — DIAZEPAM 5 MG/ML IJ SOLN: 10.0000 mg | Freq: Once | INTRAMUSCULAR | Status: DC

## 2020-06-21 MED ORDER — DIAZEPAM 5 MG/ML IJ SOLN
INTRAMUSCULAR | Status: AC
  Filled 2020-06-21: qty 2

## 2020-06-21 MED ORDER — MIDAZOLAM HCL 2 MG/2ML IJ SOLN
INTRAMUSCULAR | Status: AC
  Administered 2020-06-21: 1 mg via INTRAVENOUS
  Filled 2020-06-21: qty 2

## 2020-06-21 MED ORDER — MIDAZOLAM HCL (PF) 10 MG/2ML IJ SOLN
INTRAMUSCULAR | Status: AC
  Administered 2020-06-21: 10 mg via INTRAVENOUS
  Filled 2020-06-21: qty 2

## 2020-06-21 MED ORDER — SODIUM CHLORIDE 0.9 % IV SOLN: INTRAVENOUS | Status: DC

## 2020-06-21 MED ORDER — MIDAZOLAM HCL (PF) 10 MG/2ML IJ SOLN: 10.0000 mg | Freq: Once | INTRAMUSCULAR | Status: AC

## 2020-06-21 MED ORDER — LEVETIRACETAM IN NACL 1000 MG/100ML IV SOLN
1000.0000 mg | Freq: Once | INTRAVENOUS | Status: AC
  Administered 2020-06-21: 1000 mg via INTRAVENOUS
  Filled 2020-06-21: qty 100

## 2020-06-21 MED ORDER — MIDAZOLAM HCL (PF) 5 MG/ML IJ SOLN: 0.2000 mg/kg | INTRAMUSCULAR | Status: DC | PRN

## 2020-06-21 MED ORDER — DIAZEPAM 5 MG/ML IJ SOLN: 5.0000 mg | INTRAMUSCULAR | Status: DC | PRN

## 2020-06-21 MED ORDER — MIDAZOLAM BOLUS VIA INFUSION
0.2000 mg/kg | INTRAVENOUS | Status: DC | PRN
  Filled 2020-06-21: qty 13

## 2020-06-21 MED ORDER — MIDAZOLAM 50MG/50ML (1MG/ML) PREMIX INFUSION
0.5000 mg/h | INTRAVENOUS | Status: DC
  Filled 2020-06-21: qty 50

## 2020-06-21 MED ORDER — MIDAZOLAM HCL 2 MG/2ML IJ SOLN: 1.0000 mg | INTRAMUSCULAR | Status: DC | PRN

## 2020-06-21 NOTE — ED Notes (Signed)
Reported from Robin, RN that 2 mg Valuim given IV for seizure at 1600.

## 2020-06-21 NOTE — ED Notes (Signed)
Seizing stopped, lasted approx 

## 2020-06-21 NOTE — ED Triage Notes (Signed)
Pt's boyfriend reports pt has history of seizures and takes keppra and lithium.  Reports she was in front passenger's seat of car and suddenly didn't feel well.  Reports c/o her vision went then started seizing.  Pt arrived to hospital unresponsive, breathing and pulse intact.  Pt now waking up and answering some questions appropriately.  Able to state name and birthday and following commands.

## 2020-06-21 NOTE — ED Notes (Signed)
Pt seizing again.  PA student at bedside with RN's and will get EDP.

## 2020-06-21 NOTE — ED Notes (Signed)
Pt informed that she would get a bed at [facility], pt got upset that her boyfriend would not be able to stay the night with her.  Pt insisted that he stay and that she could not stay by herself due to her anxiety.  IV's removed from bilateral AC's and site wnl.  Pt ambulated out of dept with her boyfriend at her side.

## 2020-06-21 NOTE — ED Notes (Signed)
Dr. Thomes Dinning texted to notify of pt's leaving AMA.   Pt did sign AMA form prior to leaving.

## 2020-06-21 NOTE — ED Notes (Addendum)
Pt with seizure activity for 35 sec. While Dr. Mariea Clonts in room and witnessed seizure.  Versed 1 mg ordered verbally and given IV to right [facility]

## 2020-06-21 NOTE — ED Notes (Signed)
Pt's belongings placed in pt belongings bag. Pt has a diamond ring that is placed in a biohazard bag in her pt belonging bag.

## 2020-06-21 NOTE — ED Provider Notes (Addendum)
[facility] EMERGENCY DEPARTMENT Provider Note   CSN: [id number] Arrival date & time: 06/21/20  1541     History Chief Complaint  Patient presents with  . Seizures    Erin Krueger is a 24 y.o. female.  HPI   Patient presents to the ED for evaluation of possible seizure.  Patient has history of seizure.  She takes Keppra and lithium.  According to the boyfriend the patient was in the front seat passenger and suddenly started to not feel well.  She indicated her vision change.  Suddenly she started having tonic-clonic activity.  In the ED she arrived unresponsive.  Patient started to ask some questions and was able to state her name and her birthday and follow commands however by the time I arrived at the bedside the patient was now unresponsive again.  She had some subtle rhythmic type twitching  Past Medical History:  Diagnosis Date  . ADHD (attention deficit hyperactivity disorder)   . Allergy   . Anxiety   . Asthma   . Eating disorder   . Headache(784.0)   . Kidney stone    kidney stones  . Migraines   . ODD (oppositional defiant disorder)   . PID (acute pelvic inflammatory disease) 01/29/2016  . PTSD (post-traumatic stress disorder)   . Seizures [facility])    diagnosed age 5    Patient Active Problem List   Diagnosis Date Noted  . Nexplanon insertion 02/02/2020  . Pregnancy examination or test, negative result 02/02/2020  . Severe preeclampsia 12/27/2019  . Obesity affecting pregnancy in third trimester 11/07/2019  . Adopted person 10/04/2019  . Status epilepticus (HCC) 11/29/2018  . Asthma 11/29/2018  . Recurrent UTI (urinary tract infection) complicating pregnancy 04/08/2018  . Marijuana use 04/07/2018  . Smoker 04/05/2018  . Family history of congenital heart defect 04/05/2018  . 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 Bipolar 1 disorder (HCC) 05/09/2016  . Breakthrough seizure (HCC) 01/07/2016  . Seizure (HCC) 01/07/2016  . Conduct disorder, adolescent-onset type  08/09/2013  . PTSD (post-traumatic stress disorder) 05/12/2013  . 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 Polysubstance abuse (HCC) 05/12/2013    Past Surgical History:  Procedure Laterality Date  . right knee surgery    . TONSILLECTOMY AND ADENOIDECTOMY       OB History    Gravida  4   Para  3   Term  2   Preterm  1   AB  1   Living  3     SAB  1   TAB      Ectopic      Multiple  0   Live Births  3           Family History  Problem Relation Age of Onset  . Diabetes Mother   . Diabetes Sister   . Breast cancer Maternal Grandmother   . Diabetes Maternal Grandmother   . Glaucoma Maternal Grandmother   . Other Son        born with hole in heart  . Other Daughter        born with hole in heart    Social History   Tobacco Use  . Smoking status: Former Smoker    Packs/day: 0.50    Types: Cigarettes, E-cigarettes  . Smokeless tobacco: Never Used  . Tobacco comment: nicotine patch  Vaping Use  . Vaping Use: Former  . Substances: Flavoring  Substance Use Topics  . Alcohol use: Not Currently  . Drug  use: Not Currently    Types: Marijuana    Comment: Not since 02/13    Home Medications Prior to Admission medications   Medication Sig Start Date End Date Taking? Authorizing Provider  levETIRAcetam (KEPPRA) 1000 MG tablet Take 1 tablet (1,000 mg total) by mouth 2 (two) times daily. 01/22/20 06/21/20 Yes Wurst, [place], PA-C  traZODone (DESYREL) 100 MG tablet Take 100 mg by mouth at bedtime. 06/11/20  Yes [provider]  ibuprofen (ADVIL) 800 MG tablet Take 1 tablet (800 mg total) by mouth 3 (three) times daily. Patient taking differently: Take 800 mg by mouth as needed.  01/22/20   Wurst, [place], PA-C  albuterol (PROVENTIL HFA;VENTOLIN HFA) 108 (90 Base) MCG/ACT inhaler Inhale 2 puffs into the lungs every 6 (six) hours as needed for wheezing or shortness of breath. Patient not taking: Reported on 11/22/2019  04/05/18 11/28/19  Cheral Marker, CNM  metoCLOPramide (REGLAN) 10 MG tablet Take 1 tablet (10 mg total) by mouth every 6 (six) hours as needed for nausea. 07/19/19 11/28/19  Cresenzo-Dishmon, Scarlette Calico, CNM    Allergies    Bee venom, Cashew nut oil, Penicillins, Ativan [lorazepam], Lodine [etodolac], Prednisone, Cocoa butter, and Latex  Review of Systems   Review of Systems  Unable to perform ROS: Mental status change    Physical Exam Updated Vital Signs BP 107/76 (BP Location: Left Arm)   Pulse 90   Temp 98.3 F (36.8 C) (Oral)   Resp 20   Ht 1.702 m (5\' 7" )   Wt 81.2 kg   SpO2 99%   BMI 28.04 kg/m   Physical Exam Vitals and nursing note reviewed.  Constitutional:      Appearance: She is well-developed.     Comments: unresponsive  HENT:     Head: Normocephalic and atraumatic.     Right Ear: External ear normal.     Left Ear: External ear normal.  Eyes:     General: No scleral icterus.       Right eye: No discharge.        Left eye: No discharge.     Conjunctiva/sclera: Conjunctivae normal.  Neck:     Trachea: No tracheal deviation.  Cardiovascular:     Rate and Rhythm: Normal rate and regular rhythm.  Pulmonary:     Effort: Pulmonary effort is normal. No respiratory distress.     Breath sounds: Normal breath sounds. No stridor. No wheezing or rales.  Abdominal:     General: Bowel sounds are normal. There is no distension.     Palpations: Abdomen is soft.     Tenderness: There is no abdominal tenderness. There is no guarding or rebound.  Musculoskeletal:        General: No tenderness.     Cervical back: Neck supple.  Skin:    General: Skin is warm and dry.     Findings: No rash.  Neurological:     Cranial Nerves: No cranial nerve deficit (no facial droop, extraocular movements intact, no slurred speech).     Motor: Seizure activity present. No abnormal muscle tone.     Coordination: Coordination normal.     Comments: Unresponsive, breathing spontaneously,  slight twitching of the head, rotary nystagmus     ED Results / Procedures / Treatments   Labs (all labs ordered are listed, but only abnormal results are displayed) Labs Reviewed  COMPREHENSIVE METABOLIC PANEL - Abnormal; Notable for the following components:      Result Value   Chloride 112 (*)  CO2 19 (*)    AST 14 (*)    All other components within normal limits  RAPID URINE DRUG SCREEN, HOSP PERFORMED - Abnormal; Notable for the following components:   Benzodiazepines POSITIVE (*)    All other components within normal limits  SARS CORONAVIRUS 2 BY RT PCR (HOSPITAL ORDER, PERFORMED IN [facility])  CBC  ETHANOL  LEVETIRACETAM LEVEL  CBG MONITORING, ED  POC URINE PREG, ED  CBG MONITORING, ED    EKG EKG Interpretation  Date/Time:  Friday June 21 2020 15:46:36 EDT Ventricular Rate:  91 PR Interval:    QRS Duration: 96 QT Interval:  342 QTC Calculation: 421 R Axis:   87 Text Interpretation: Sinus rhythm Borderline Q waves in inferior leads No significant change since last tracing Confirmed by Linwood Dibbles [phone]) on 06/21/2020 3:59:09 PM   Radiology CT HEAD WO CONTRAST  Result Date: 06/21/2020 CLINICAL DATA:  Seizure, abnormal neuro exam. Additional provided: Seizure prior to arrival, history of seizures, unresponsive. EXAM: CT HEAD WITHOUT CONTRAST TECHNIQUE: Contiguous axial images were obtained from the base of the skull through the vertex without intravenous contrast. COMPARISON:  Prior head CT examinations 10/21/2017 and earlier FINDINGS: Brain: Cerebral volume is normal. There is no acute intracranial hemorrhage. No demarcated cortical infarct. No extra-axial fluid collection. No evidence of intracranial mass. No midline shift. Vascular: No hyperdense vessel. Skull: Normal. Negative for fracture or focal lesion. Sinuses/Orbits: Visualized orbits show no acute finding. Moderate ethmoid sinus mucosal thickening. Scattered small mucous retention cysts within  the ethmoid air cells, right sphenoid sinus and bilateral maxillary sinuses. No significant mastoid effusion. IMPRESSION: No CT evidence of acute intracranial abnormality. Moderate ethmoid sinus mucosal thickening. Small mucous retention cysts within the ethmoid, right sphenoid and bilateral maxillary sinuses. Electronically Signed   By: Jackey Loge DO   On: 06/21/2020 17:50    Procedures .Critical Care Performed by: Linwood Dibbles, MD Authorized by: Linwood Dibbles, MD   Critical care provider statement:    Critical care time (minutes):  45   Critical care was time spent personally by me on the following activities:  Discussions with consultants, evaluation of patient's response to treatment, examination of patient, ordering and performing treatments and interventions, ordering and review of laboratory studies, ordering and review of radiographic studies, pulse oximetry, re-evaluation of patient's condition, obtaining history from patient or surrogate and review of old charts   (including critical care time)  Medications Ordered in ED Medications  diazepam (VALIUM) injection 10 mg (has no administration in time range)  diazepam (VALIUM) 5 MG/ML injection (has no administration in time range)  0.9 %  sodium chloride infusion (has no administration in time range)  midazolam PF (VERSED) injection 12.5 mg (has no administration in time range)    And  midazolam (VERSED) bolus via infusion 12.3 mg (has no administration in time range)    And  midazolam (VERSED) 50 mg/50 mL (1 mg/mL) premix infusion (has no administration in time range)  levETIRAcetam (KEPPRA) IVPB 1000 mg/100 mL premix (0 mg Intravenous Stopped 06/21/20 1808)  midazolam PF (VERSED) injection 10 mg (10 mg Intravenous Given 06/21/20 1619)    ED Course  I have reviewed the triage vital signs and the nursing notes.  Pertinent labs & imaging results that were available during my care of the patient were reviewed by me and considered in my  medical decision making (see chart for details).  Clinical Course as of Jun 22 1835  Fri Jun 21, 2020  1601 Presentation concerning for active seizure.  Valium IV given.  Will give versed IV for recurrent episode.  Monitor closely   [JK]  1614 Pt appears to be seizing again.   Will give versed IV    [JK]  1614 Will give a dose of keppra IV   [JK]  1651 No recurrent seizure at this time.  Still sedated   [JK]  1753 Pt is now awake.  Upset, does not want to be in the hospital.  Questionable recurrent seizure per rn right prior to my recheck   [JK]  1803 Discuss with Dr Otelia LimesLindzen.  They can see patient at [place].  Request hospitalist call their service when patient arrives   [JK]    Clinical Course User Index [JK] Linwood DibblesKnapp, Rekita Miotke, MD   MDM Rules/Calculators/A&P                          Pt presents to the ED with recurrent seizures.  History of same.  Noted to have several episodes in the ED.  Pt has required several doses of benzodiazepines.  Also given 1 gm keppra.  Labs unremarkable.  CT without acute abnormality.  Sx concerning for status epilepticus.  Pt may need emergent EEG.  Will consult with [facility]y at [facility], discuss possible transfer for further treatment.  Discussed with Dr Otelia LimesLindzen.  [facility]urology [facility].  Plan on hospitalist admission/transfer    Final Clinical Impression(s) / ED Diagnoses Final diagnoses:  Status epilepticus [facility](HCC)     Linwood DibblesKnapp, Damaso Laday, MD 06/21/20 [phone]  Notified that patient got dressed and wanted to leave because she was cold.  I explained to the patient that I had arranged to admit [facility] the [facility] because of her recurrent seizures.  I told her I felt she needed to see a neurologist and that it was dangerous for her to leave because of her multiple seizures this evening.  Patient is complaining about her blankets and states she just wants to go home where she can be warm.  Her boyfriend is trying to encourage  her to stay as well.  I did turn up the temperature in the room.  We will provide the patient warm blankets.  She has not a verbally agreed to stay but she is not walking out of the ED at this time   Linwood DibblesKnapp, Tahjanae Blankenburg, MD 06/21/20 Ernestina [id number]

## 2020-06-21 NOTE — ED Notes (Signed)
Pt sitting up in the bed with boyfriend at bedside.  Pt wanting to leave AMA again, pt wanting to smoke, nicotine patch offered by admitting doctor.  Pt and boyfriend arguing in room.  Dr. Mariea Clonts is aware pt wanting to leave AMA and states pt is competent to make that decision.  Pt will have to leave AMA.

## 2020-06-21 NOTE — ED Notes (Signed)
Pt removed telemetry and placed clothes back on stating she wanted to go home, had EDP come talk to pt.  Pt very drowsy and feeling asleep while sitting in the chair.  Pt assisted to bed, placed back on monitor.

## 2020-06-21 NOTE — H&P (Addendum)
History and Physical    Erin Krueger WNI:[id number] DOB: 04/20/1996 DOA: 06/21/2020  PCP: [facility]   Patient coming from: Home  I have personally briefly reviewed patient's old medical records in [facility]  Chief Complaint: Seizure activity  HPI: Erin Krueger is a 24 y.o. female with medical history significant for gout, bipolar disorder, PTSD/ADHD, asthma. Patient was brought to the ED by boyfriend with reports of witnessed seizure activity.  Patient's boyfriend was driving the vehicle when suddenly patient was complaining that it feels well, and that she could not sleep.  Suddenly patient started having a seizure.  Boyfriend brought patient to the hospital.  Patient's boyfriend reports that from onset to the patient got to the ED, patient had several episodes where she would stop and then start seizing again.  On arrival to the ED, patient was unresponsive.   Boyfriend reports compliance with Keppra 1000 mg twice a day and lithium.  She also takes trazodone.  But today, patient's father he had her Keppra, so she did not take it this morning but she got it later in the afternoon at about 2 PM.  His boyfriend has been with patient for about 2 years, and reports that patient has had occasional seizures during this time.  He denies any alcohol intake, or other illicit drug use.  Patient answered a few questions before she had a seizure during my examination.  ED Course: Slightly tachycardic, intermittent tachypnea, blood pressure systolic 110s to 381W.  O2 sats greater than 93% on room air.  Unremarkable CBC BMP.  Head CT without acute abnormality.  EKG unremarkable.  Patient was given 2 mg of Valium, 10 mg of Versed, and Keppra 1000 mg bolus.  Patient had another seizure witnessed in the ED by ED staff, on EDP arrival patient was unresponsive.  At the time of my evaluation, patient was drowsy, sitting on edge of bed dressed up and wanted to go home.   Then became tearful, that she was confused and does not know how she got to the ED. Then patient had another seizure-see documentation under exam.  Review of Systems: As per HPI all other systems reviewed and negative.  Past Medical History:  Diagnosis Date  . ADHD (attention deficit hyperactivity disorder)   . Allergy   . Anxiety   . Asthma   . Eating disorder   . Headache(784.0)   . Kidney stone    kidney stones  . Migraines   . ODD (oppositional defiant disorder)   . PID (acute pelvic inflammatory disease) 01/29/2016  . PTSD (post-traumatic stress disorder)   . Seizures (HCC)    diagnosed age 79    Past Surgical History:  Procedure Laterality Date  . right knee surgery    . TONSILLECTOMY AND ADENOIDECTOMY       reports that she has quit smoking. Her smoking use included cigarettes and e-cigarettes. She smoked 0.50 packs per day. She has never used smokeless tobacco. She reports previous alcohol use. She reports previous drug use. Drug: Marijuana.  Allergies  Allergen Reactions  . Bee Venom Anaphylaxis  . Cashew Nut Oil Anaphylaxis  . Penicillins Anaphylaxis    Has patient had a PCN reaction causing immediate rash, facial/tongue/throat swelling, SOB or lightheadedness with hypotension: Unknown Has patient had a PCN reaction causing severe rash involving mucus membranes or skin necrosis: Unknown Has patient had a PCN reaction that required hospitalization: Unknown Has patient had a PCN reaction occurring within the last  10 years: Unknown If all of the above answers are "NO", then may proceed with Cephalosporin use.   . Ativan [Lorazepam]     Aggressive-agitation  . Lodine [Etodolac] Nausea And Vomiting and Other (See Comments)    Patient states had vomiting when taking medication, and threw up blood; also complained of severe headaches while on medication  . Prednisone Other (See Comments)    Bleeding nose bleeding and internal bleeding  . Cocoa Butter Rash  . Latex Rash     Family History  Problem Relation Age of Onset  . Diabetes Mother   . Diabetes Sister   . Breast cancer Maternal Grandmother   . Diabetes Maternal Grandmother   . Glaucoma Maternal Grandmother   . Other Son        born with hole in heart  . Other Daughter        born with hole in heart    Prior to Admission medications   Medication Sig Start Date End Date Taking? Authorizing Provider  levETIRAcetam (KEPPRA) 1000 MG tablet Take 1 tablet (1,000 mg total) by mouth 2 (two) times daily. 01/22/20 06/21/20 Yes Wurst, [place], PA-C  traZODone (DESYREL) 100 MG tablet Take 100 mg by mouth at bedtime. 06/11/20  Yes [provider]  ibuprofen (ADVIL) 800 MG tablet Take 1 tablet (800 mg total) by mouth 3 (three) times daily. Patient taking differently: Take 800 mg by mouth as needed.  01/22/20   Wurst, [place], PA-C  albuterol (PROVENTIL HFA;VENTOLIN HFA) 108 (90 Base) MCG/ACT inhaler Inhale 2 puffs into the lungs every 6 (six) hours as needed for wheezing or shortness of breath. Patient not taking: Reported on 11/22/2019 04/05/18 11/28/19  Cheral MarkerBooker, Kimberly R, CNM  metoCLOPramide (REGLAN) 10 MG tablet Take 1 tablet (10 mg total) by mouth every 6 (six) hours as needed for nausea. 07/19/19 11/28/19  Jacklyn Shellresenzo-Dishmon, Frances, CNM    Physical Exam: Vitals:   06/21/20 1652 06/21/20 1653 06/21/20 1739 06/21/20 1745  BP:   107/76 107/76  Pulse: 90 87 90 90  Resp: 22 22 (!) 28 20  Temp:      TempSrc:      SpO2: 96% 97% 100% 99%  Weight:      Height:        Constitutional: Initially tearful requesting to go home. Vitals:   06/21/20 1652 06/21/20 1653 06/21/20 1739 06/21/20 1745  BP:   107/76 107/76  Pulse: 90 87 90 90  Resp: 22 22 (!) 28 20  Temp:      TempSrc:      SpO2: 96% 97% 100% 99%  Weight:      Height:       Eyes: Pupils dilated, equal. ENMT: Mucous membranes are moist.   Neck: normal, supple, no masses, no thyromegaly Respiratory: Normal respiratory effort. No accessory  muscle use.  Cardiovascular: Regular rate and rhythm, No extremity edema. 2+ pedal pulses. No carotid bruits.  Abdomen: no tenderness, no masses palpated. No hepatosplenomegaly. Bowel sounds positive.  Musculoskeletal: no clubbing / cyanosis. No joint deformity upper and lower extremities. Good ROM, no contractures. Normal muscle tone.  Skin: no rashes, lesions, ulcers. No induration Neurologic: Patient initially lying in bed, awake but drowsy, became unresponsive, no response to sternal rub/pain.  About 10 seconds later, and then patient had a seizure that lasted about 35 seconds, tonic, no jerking of extremities. Neck and extremities were rigid.  Both eyes rolled up.  No tongue biting.  No witnessed fecal or urinary incontinence, no  foaming at the mouth.  After seizure, patient was postictal, deeply asleep, not responding to pain, no snoring respirations.  Vitals stable throughout.  Psychiatric: Awake, drowsy, then had a seizure..   Labs on Admission: I have personally reviewed following labs and imaging studies  CBC: Recent Labs  Lab 06/21/20 1602  WBC 9.4  HGB 14.7  HCT 44.4  MCV 88.6  PLT 221   Basic Metabolic Panel: Recent Labs  Lab 06/21/20 1602  NA 143  K 3.7  CL 112*  CO2 19*  GLUCOSE 84  BUN 9  CREATININE 0.81  CALCIUM 9.7   Liver Function Tests: Recent Labs  Lab 06/21/20 1602  AST 14*  ALT 12  ALKPHOS 71  BILITOT 0.5  PROT 8.0  ALBUMIN 4.8   CBG: Recent Labs  Lab 06/21/20 1603  GLUCAP 84    Radiological Exams on Admission: CT HEAD WO CONTRAST  Result Date: 06/21/2020 CLINICAL DATA:  Seizure, abnormal neuro exam. Additional provided: Seizure prior to arrival, history of seizures, unresponsive. EXAM: CT HEAD WITHOUT CONTRAST TECHNIQUE: Contiguous axial images were obtained from the base of the skull through the vertex without intravenous contrast. COMPARISON:  Prior head CT examinations 10/21/2017 and earlier FINDINGS: Brain: Cerebral volume is normal.  There is no acute intracranial hemorrhage. No demarcated cortical infarct. No extra-axial fluid collection. No evidence of intracranial mass. No midline shift. Vascular: No hyperdense vessel. Skull: Normal. Negative for fracture or focal lesion. Sinuses/Orbits: Visualized orbits show no acute finding. Moderate ethmoid sinus mucosal thickening. Scattered small mucous retention cysts within the ethmoid air cells, right sphenoid sinus and bilateral maxillary sinuses. No significant mastoid effusion. IMPRESSION: No CT evidence of acute intracranial abnormality. Moderate ethmoid sinus mucosal thickening. Small mucous retention cysts within the ethmoid, right sphenoid and bilateral maxillary sinuses. Electronically Signed   By: Jackey Loge DO   On: 06/21/2020 17:50    EKG: Independently reviewed.  Sinus rhythm, QTC 421.  Rate 91.  No significant ST-T wave changes compared to prior.  Assessment/Plan Principal Problem:   Seizure [facility]) Active Problems:   Asthma   Bipolar 1 disorder (HCC)  Seizures-at least 2 witnessed in the ED.  Appears to be true seizures.  With postictal state.  Head CT without acute abnormality.  Seizures in the ED despite 10 mg midazolam, 2 mg Valium, Keppra 1000 mg.  Blood alcohol level < 10.  UDS positive for benzos only. -Home medications Keppra, lithium. -Allergy to Ativan- aggression, agitation -PRN Valium 5 - 10 mg as it is longer acting. -Obtain brain MRI with and without contrast -Neurologist, Dr. Otelia Limes consulted by ED provider, admit to [facility]. -Care order instruction to page neurology on arrival to [facility]. -Further anti-seizure medications per neurology -N.p.o. for now -Check prolactin, magnesium, phosphorus. - IVF D5 N/s + 20 KCL100cc/hr - Neuro checks -Addendum-patient wanting to leave AGAINST MEDICAL ADVICE again, I went back to the room and I talked extensively to the patient with boyfriend at bedside, on why she should not leave-including head trauma if  she falls and hits her head, and status epilepticus resulting in brain injury.  She has voiced understanding, and insists on leaving AMA, she wants to smoke a cigarette in the hospital.  I have offered nicotine patch she tells me she has used this before when she tried to quit and it does not work. She has had multiple seizures today, she tells me she has had seizures since she was a child and this is nothing new.  When patient wants to leave she will sign AMA papers.  Asthma-stable -As needed albuterol inhaler  Bipolar disorder, PTSD, ADHD -N.p.o. for now -Hold home lithium, trazodone  DVT prophylaxis: SCDs Code Status: Full code Family Communication: Boyfriend at bedside. Disposition Plan:  ~ 2 days, then neurology evaluation for seizures. Consults called: Neurology Admission status: Inpatient, stepdown. I certify that at the point of admission it is my clinical judgment that the patient will require inpatient hospital care spanning beyond 2 midnights from the point of admission due to high intensity of service, high risk for further deterioration and high frequency of surveillance required. The following factors support the patient status of inpatient: Multiple seizures, requiring close monitoring in the stepdown unit.   Onnie Boer MD [facility]  06/21/2020, 10:31 PM

## 2020-06-21 NOTE — ED Notes (Signed)
Wasted 48ml Versed into steri cycle witnessed by [employer]

## 2020-06-22 LAB — PROLACTIN: Prolactin: 5.1 ng/mL (ref 4.8–23.3)

## 2020-06-24 ENCOUNTER — Encounter (HOSPITAL_COMMUNITY): Payer: Self-pay | Admitting: Emergency Medicine

## 2020-06-24 ENCOUNTER — Emergency Department (HOSPITAL_COMMUNITY)
Admission: EM | Admit: 2020-06-24 | Discharge: 2020-06-25 | Disposition: A | Discharge status: Home / Self Care | Payer: Medicaid Other | Attending: Emergency Medicine | Admitting: Emergency Medicine

## 2020-06-24 ENCOUNTER — Other Ambulatory Visit: Payer: Self-pay

## 2020-06-24 DIAGNOSIS — Z79899 Other long term (current) drug therapy: Secondary | ICD-10-CM | POA: Insufficient documentation

## 2020-06-24 DIAGNOSIS — F909 Attention-deficit hyperactivity disorder, unspecified type: Secondary | ICD-10-CM | POA: Diagnosis not present

## 2020-06-24 DIAGNOSIS — Z87891 Personal history of nicotine dependence: Secondary | ICD-10-CM | POA: Insufficient documentation

## 2020-06-24 DIAGNOSIS — R259 Unspecified abnormal involuntary movements: Secondary | ICD-10-CM | POA: Diagnosis not present

## 2020-06-24 DIAGNOSIS — R45851 Suicidal ideations: Secondary | ICD-10-CM | POA: Insufficient documentation

## 2020-06-24 DIAGNOSIS — Z91040 Latex allergy status: Secondary | ICD-10-CM | POA: Insufficient documentation

## 2020-06-24 DIAGNOSIS — Z046 Encounter for general psychiatric examination, requested by authority: Secondary | ICD-10-CM

## 2020-06-24 DIAGNOSIS — F419 Anxiety disorder, unspecified: Secondary | ICD-10-CM | POA: Diagnosis not present

## 2020-06-24 DIAGNOSIS — F3112 Bipolar disorder, current episode manic without psychotic features, moderate: Secondary | ICD-10-CM | POA: Diagnosis not present

## 2020-06-24 DIAGNOSIS — J45909 Unspecified asthma, uncomplicated: Secondary | ICD-10-CM | POA: Insufficient documentation

## 2020-06-24 LAB — CBC WITH DIFFERENTIAL/PLATELET
Abs Immature Granulocytes: 0.01 K/uL (ref 0.00–0.07)
Basophils Absolute: 0 K/uL (ref 0.0–0.1)
Basophils Relative: 0 %
Eosinophils Absolute: 0.1 K/uL (ref 0.0–0.5)
Eosinophils Relative: 1 %
HCT: 42.1 % (ref 36.0–46.0)
Hemoglobin: 13.4 g/dL (ref 12.0–15.0)
Immature Granulocytes: 0 %
Lymphocytes Relative: 29 %
Lymphs Abs: 2.5 K/uL (ref 0.7–4.0)
MCH: 29 pg (ref 26.0–34.0)
MCHC: 31.8 g/dL (ref 30.0–36.0)
MCV: 91.1 fL (ref 80.0–100.0)
Monocytes Absolute: 0.5 K/uL (ref 0.1–1.0)
Monocytes Relative: 6 %
Neutro Abs: 5.5 K/uL (ref 1.7–7.7)
Neutrophils Relative %: 64 %
Platelets: 218 K/uL (ref 150–400)
RBC: 4.62 MIL/uL (ref 3.87–5.11)
RDW: 13.3 % (ref 11.5–15.5)
WBC: 8.7 K/uL (ref 4.0–10.5)
nRBC: 0 % (ref 0.0–0.2)

## 2020-06-24 LAB — COMPREHENSIVE METABOLIC PANEL
ALT: 13 U/L (ref 0–44)
AST: 12 U/L — ABNORMAL LOW (ref 15–41)
Albumin: 4.5 g/dL (ref 3.5–5.0)
Alkaline Phosphatase: 68 U/L (ref 38–126)
Anion gap: 6 (ref 5–15)
BUN: 10 mg/dL (ref 6–20)
CO2: 20 mmol/L — ABNORMAL LOW (ref 22–32)
Calcium: 9 mg/dL (ref 8.9–10.3)
Chloride: 111 mmol/L (ref 98–111)
Creatinine, Ser: 0.69 mg/dL (ref 0.44–1.00)
GFR calc Af Amer: >60 mL/min (ref >60)
GFR calc non Af Amer: >60 mL/min (ref >60)
Glucose, Bld: 103 mg/dL — ABNORMAL HIGH (ref 70–99)
Potassium: 3.4 mmol/L — ABNORMAL LOW (ref 3.5–5.1)
Sodium: 137 mmol/L (ref 135–145)
Total Bilirubin: 0.6 mg/dL (ref 0.3–1.2)
Total Protein: 7.7 g/dL (ref 6.5–8.1)

## 2020-06-24 LAB — URINALYSIS, ROUTINE W REFLEX MICROSCOPIC
Bacteria, UA: NONE SEEN
Bilirubin Urine: NEGATIVE
Glucose, UA: NEGATIVE mg/dL
Ketones, ur: 5 mg/dL — AB
Nitrite: NEGATIVE
Protein, ur: NEGATIVE mg/dL
Specific Gravity, Urine: 1.008 (ref 1.005–1.030)
pH: 6 (ref 5.0–8.0)

## 2020-06-24 LAB — SALICYLATE LEVEL: Salicylate Lvl: 7.1 mg/dL (ref 7.0–30.0)

## 2020-06-24 LAB — LEVETIRACETAM LEVEL: Levetiracetam Lvl: 23.5 ug/mL (ref 10.0–40.0)

## 2020-06-24 LAB — ACETAMINOPHEN LEVEL: Acetaminophen (Tylenol), Serum: 11 ug/mL (ref 10–30)

## 2020-06-24 LAB — LITHIUM LEVEL: Lithium Lvl: <0.06 mmol/L — ABNORMAL LOW (ref 0.60–1.20)

## 2020-06-24 LAB — RAPID URINE DRUG SCREEN, HOSP PERFORMED
Amphetamines: NOT DETECTED
Barbiturates: NOT DETECTED
Benzodiazepines: POSITIVE — AB
Cocaine: NOT DETECTED
Opiates: NOT DETECTED
Tetrahydrocannabinol: NOT DETECTED

## 2020-06-24 LAB — ETHANOL: Alcohol, Ethyl (B): <10 mg/dL

## 2020-06-24 LAB — PREGNANCY, URINE: Preg Test, Ur: NEGATIVE

## 2020-06-24 MED ORDER — TRAZODONE HCL 50 MG PO TABS
100.0000 mg | ORAL_TABLET | Freq: Every day | ORAL | Status: DC
  Administered 2020-06-24: 100 mg via ORAL
  Filled 2020-06-24: qty 2

## 2020-06-24 MED ORDER — ACETAMINOPHEN 325 MG PO TABS: 650.0000 mg | ORAL_TABLET | ORAL | Status: DC | PRN

## 2020-06-24 MED ORDER — NICOTINE 21 MG/24HR TD PT24
21.0000 mg | MEDICATED_PATCH | Freq: Once | TRANSDERMAL | Status: AC
  Administered 2020-06-24: 21 mg via TRANSDERMAL
  Filled 2020-06-24: qty 1

## 2020-06-24 MED ORDER — LEVETIRACETAM 500 MG PO TABS
1000.0000 mg | ORAL_TABLET | Freq: Two times a day (BID) | ORAL | Status: DC
  Administered 2020-06-24 – 2020-06-25 (×2): 1000 mg via ORAL
  Filled 2020-06-24 (×2): qty 2
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
## 2020-06-24 NOTE — ED Notes (Signed)
Pt. Belongings is in locker

## 2020-06-24 NOTE — ED Notes (Signed)
Pt is in scrubs and her belongings are in locker.
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
## 2020-06-25 NOTE — ED Notes (Signed)
Pt in bed watching tv  
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
## 2020-06-26 LAB — LEVETIRACETAM LEVEL: Levetiracetam Lvl: <1 ug/mL — ABNORMAL LOW (ref 10.0–40.0)

## 2020-12-15 ENCOUNTER — Emergency Department (HOSPITAL_COMMUNITY)
Admission: EM | Admit: 2020-12-15 | Discharge: 2020-12-16 | Disposition: A | Discharge status: Home / Self Care | Payer: Medicaid Other | Attending: Emergency Medicine | Admitting: Emergency Medicine

## 2020-12-15 DIAGNOSIS — Z87891 Personal history of nicotine dependence: Secondary | ICD-10-CM | POA: Insufficient documentation

## 2020-12-15 DIAGNOSIS — R000 Tachycardia, unspecified: Secondary | ICD-10-CM | POA: Diagnosis not present

## 2020-12-15 DIAGNOSIS — Z91040 Latex allergy status: Secondary | ICD-10-CM | POA: Diagnosis not present

## 2020-12-15 DIAGNOSIS — J45909 Unspecified asthma, uncomplicated: Secondary | ICD-10-CM | POA: Diagnosis not present

## 2020-12-15 DIAGNOSIS — R569 Unspecified convulsions: Secondary | ICD-10-CM | POA: Diagnosis present

## 2020-12-15 DIAGNOSIS — Z20822 Contact with and (suspected) exposure to covid-19: Secondary | ICD-10-CM | POA: Diagnosis not present

## 2020-12-15 LAB — COMPREHENSIVE METABOLIC PANEL
ALT: 11 U/L (ref 0–44)
AST: 13 U/L — ABNORMAL LOW (ref 15–41)
Albumin: 3.6 g/dL (ref 3.5–5.0)
Alkaline Phosphatase: 56 U/L (ref 38–126)
Anion gap: 9 (ref 5–15)
BUN: 10 mg/dL (ref 6–20)
CO2: 23 mmol/L (ref 22–32)
Calcium: 8.4 mg/dL — ABNORMAL LOW (ref 8.9–10.3)
Chloride: 106 mmol/L (ref 98–111)
Creatinine, Ser: 0.84 mg/dL (ref 0.44–1.00)
GFR, Estimated: >60 mL/min (ref >60)
Glucose, Bld: 117 mg/dL — ABNORMAL HIGH (ref 70–99)
Potassium: 3.5 mmol/L (ref 3.5–5.1)
Sodium: 138 mmol/L (ref 135–145)
Total Bilirubin: 0.4 mg/dL (ref 0.3–1.2)
Total Protein: 5.9 g/dL — ABNORMAL LOW (ref 6.5–8.1)

## 2020-12-15 LAB — CBC WITH DIFFERENTIAL/PLATELET
Abs Immature Granulocytes: 0.01 10*3/uL (ref 0.00–0.07)
Basophils Absolute: 0 10*3/uL (ref 0.0–0.1)
Basophils Relative: 0 %
Eosinophils Absolute: 0.2 10*3/uL (ref 0.0–0.5)
Eosinophils Relative: 3 %
HCT: 41.6 % (ref 36.0–46.0)
Hemoglobin: 13.6 g/dL (ref 12.0–15.0)
Immature Granulocytes: 0 %
Lymphocytes Relative: 35 %
Lymphs Abs: 2.5 10*3/uL (ref 0.7–4.0)
MCH: 30.1 pg (ref 26.0–34.0)
MCHC: 32.7 g/dL (ref 30.0–36.0)
MCV: 92 fL (ref 80.0–100.0)
Monocytes Absolute: 0.4 10*3/uL (ref 0.1–1.0)
Monocytes Relative: 5 %
Neutro Abs: 4 10*3/uL (ref 1.7–7.7)
Neutrophils Relative %: 57 %
Platelets: 193 10*3/uL (ref 150–400)
RBC: 4.52 MIL/uL (ref 3.87–5.11)
RDW: 13.2 % (ref 11.5–15.5)
WBC: 7.1 10*3/uL (ref 4.0–10.5)
nRBC: 0 % (ref 0.0–0.2)

## 2020-12-15 LAB — I-STAT BETA HCG BLOOD, ED (MC, WL, AP ONLY): I-stat hCG, quantitative: <5 m[IU]/mL (ref <5)

## 2020-12-15 LAB — CBG MONITORING, ED: Glucose-Capillary: 96 mg/dL (ref 70–99)

## 2020-12-15 LAB — I-STAT CHEM 8, ED
BUN: 12 mg/dL (ref 6–20)
Calcium, Ion: 1.21 mmol/L (ref 1.15–1.40)
Chloride: 108 mmol/L (ref 98–111)
Creatinine, Ser: 0.8 mg/dL (ref 0.44–1.00)
Glucose, Bld: 105 mg/dL — ABNORMAL HIGH (ref 70–99)
HCT: 40 % (ref 36.0–46.0)
Hemoglobin: 13.6 g/dL (ref 12.0–15.0)
Potassium: 3.5 mmol/L (ref 3.5–5.1)
Sodium: 144 mmol/L (ref 135–145)
TCO2: 24 mmol/L (ref 22–32)

## 2020-12-15 LAB — LITHIUM LEVEL: Lithium Lvl: <0.06 mmol/L — ABNORMAL LOW (ref 0.60–1.20)

## 2020-12-15 LAB — SALICYLATE LEVEL: Salicylate Lvl: <7 mg/dL — ABNORMAL LOW (ref 7.0–30.0)

## 2020-12-15 LAB — ETHANOL: Alcohol, Ethyl (B): <10 mg/dL (ref <10)

## 2020-12-15 LAB — MAGNESIUM: Magnesium: 1.8 mg/dL (ref 1.7–2.4)

## 2020-12-15 LAB — ACETAMINOPHEN LEVEL: Acetaminophen (Tylenol), Serum: <10 ug/mL — ABNORMAL LOW (ref 10–30)

## 2020-12-15 MED ORDER — SODIUM CHLORIDE 0.9 % IV BOLUS
1000.0000 mL | Freq: Once | INTRAVENOUS | Status: AC
  Administered 2020-12-15: 1000 mL via INTRAVENOUS

## 2020-12-15 MED ORDER — LORAZEPAM 2 MG/ML IJ SOLN: 2.0000 mg | Freq: Once | INTRAMUSCULAR | Status: AC

## 2020-12-15 MED ORDER — SODIUM CHLORIDE 0.9 % IV BOLUS
1000.0000 mL | Freq: Once | INTRAVENOUS | Status: AC
  Administered 2020-12-16: 1000 mL via INTRAVENOUS

## 2020-12-15 MED ORDER — LORAZEPAM 2 MG/ML IJ SOLN
INTRAMUSCULAR | Status: AC
  Administered 2020-12-15: 2 mg via INTRAVENOUS
  Filled 2020-12-15: qty 1

## 2020-12-15 MED ORDER — LEVETIRACETAM IN NACL 1000 MG/100ML IV SOLN
1000.0000 mg | INTRAVENOUS | Status: AC
  Administered 2020-12-15 (×2): 1000 mg via INTRAVENOUS

## 2020-12-15 NOTE — ED Notes (Signed)
Patient remains awake and alert, friend at bedside. Speaking to fiance via phone.

## 2020-12-15 NOTE — ED Notes (Signed)
No further seizure activity at this time.

## 2020-12-15 NOTE — ED Provider Notes (Signed)
[facility] EMERGENCY DEPARTMENT Provider Note   CSN: [id number] Arrival date & time: 12/15/20  1921     History Chief Complaint  Patient presents with  . Seizures    Erin Krueger is a 25 y.o. female.  The history is provided by the EMS personnel and medical records.  Seizures  Erin Krueger is a 25 y.o. female who presents to the Emergency Department complaining of seizures.  Level V caveat due to AMS.  Hx is provided by EMS.  She present to the ED by EMS for evaluation of seizures.  Has hx/o same.  Per report she had two separate seizures witnessed by friend.  On fire arrival she had one seizure and for EMS she had three separate seizure events.  EMS reports generalized activity, lasting less than a minute.  She received midazolam 5 mg IM followed by 2.5 mg IV.  Initially they report that between episodes she was postictal but able to communicate between first two episodes but since that time has been unable to communicate.  She did require transient bagging for ventilation assistance in transfer.  Per report she drank alcohol last night and is usually compliant with medications.  She is on keppra for seizures but only medication brought in by EMS is trazodone, last filled in July.      Past Medical History:  Diagnosis Date  . ADHD (attention deficit hyperactivity disorder)   . Allergy   . Anxiety   . Asthma   . Eating disorder   . Headache(784.0)   . Kidney stone    kidney stones  . Migraines   . ODD (oppositional defiant disorder)   . PID (acute pelvic inflammatory disease) 01/29/2016  . PTSD (post-traumatic stress disorder)   . Seizures [facility])    diagnosed age 52    Patient Active Problem List   Diagnosis Date Noted  . Seizures (HCC) 06/21/2020  . Nexplanon insertion 02/02/2020  . Pregnancy examination or test, negative result 02/02/2020  . Severe preeclampsia 12/27/2019  . Obesity affecting pregnancy in third trimester 11/07/2019  . Adopted person  10/04/2019  . Status epilepticus (HCC) 11/29/2018  . Asthma 11/29/2018  . Recurrent UTI (urinary tract infection) complicating pregnancy 04/08/2018  . Marijuana use 04/07/2018  . Smoker 04/05/2018  . Family history of congenital heart defect 04/05/2018  . 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 Bipolar 1 disorder (HCC) 05/09/2016  . Breakthrough seizure (HCC) 01/07/2016  . Seizure (HCC) 01/07/2016  . 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 PTSD (post-traumatic stress disorder) 05/12/2013  . 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 Polysubstance abuse (HCC) 05/12/2013    Past Surgical History:  Procedure Laterality Date  . right knee surgery    . TONSILLECTOMY AND ADENOIDECTOMY       OB History    Gravida  4   Para  3   Term  2   Preterm  1   AB  1   Living  3     SAB  1   IAB      Ectopic      Multiple  0   Live Births  3           Family History  Problem Relation Age of Onset  . Diabetes Mother   . Diabetes Sister   . Breast cancer Maternal Grandmother   . Diabetes Maternal Grandmother   . Glaucoma Maternal Grandmother   . Other Son  born with hole in heart  . Other Daughter        born with hole in heart    Social History   Tobacco Use  . Smoking status: Former Smoker    Packs/day: 0.50    Types: Cigarettes, E-cigarettes  . Smokeless tobacco: Never Used  . Tobacco comment: nicotine patch  Vaping Use  . Vaping Use: Former  . Substances: Flavoring  Substance Use Topics  . Alcohol use: Not Currently  . Drug use: Not Currently    Types: Marijuana    Comment: Not since 02/13    Home Medications Prior to Admission medications   Medication Sig Start Date End Date Taking? Authorizing Provider  levETIRAcetam (KEPPRA) 1000 MG tablet Take 1 tablet (1,000 mg total) by mouth 2 (two) times daily. 01/22/20 06/24/21  Rennis Harding, PA-C  traZODone (DESYREL) 100 MG tablet Take 100 mg by mouth at  bedtime. 06/11/20   [provider]  albuterol (PROVENTIL HFA;VENTOLIN HFA) 108 (90 Base) MCG/ACT inhaler Inhale 2 puffs into the lungs every 6 (six) hours as needed for wheezing or shortness of breath. Patient not taking: Reported on 11/22/2019 04/05/18 11/28/19  Cheral Marker, CNM  metoCLOPramide (REGLAN) 10 MG tablet Take 1 tablet (10 mg total) by mouth every 6 (six) hours as needed for nausea. 07/19/19 11/28/19  Cresenzo-Dishmon, Scarlette Calico, CNM    Allergies    Bee venom, Cashew nut oil, Penicillins, Ativan [lorazepam], Lodine [etodolac], Prednisone, Cocoa butter, and Latex  Review of Systems   Review of Systems  Unable to perform ROS: Patient unresponsive  Neurological: Positive for seizures.    Physical Exam Updated Vital Signs BP 108/82   Pulse 100   Temp 98.8 F (37.1 C) (Oral)   Resp 17   Ht 5\' 7"  (1.702 m)   Wt 81.6 kg   SpO2 97%   BMI 28.19 kg/m   Physical Exam Vitals and nursing note reviewed.  Constitutional:      Appearance: She is well-developed and well-nourished.  HENT:     Head: Normocephalic and atraumatic.  Eyes:     Pupils: Pupils are equal, round, and reactive to light.  Cardiovascular:     Rate and Rhythm: Regular rhythm. Tachycardia present.     Heart sounds: No murmur heard.   Pulmonary:     Effort: Pulmonary effort is normal. No respiratory distress.     Breath sounds: Normal breath sounds.  Abdominal:     Palpations: Abdomen is soft.     Tenderness: There is no abdominal tenderness. There is no guarding or rebound.  Musculoskeletal:        General: No tenderness or edema.  Skin:    General: Skin is warm and dry.  Neurological:     Mental Status: She is alert.     Comments: Seizing on ED arrival with generalized tonic/clonic movement, head deviated to left.  Episode lasted less than one minute  Psychiatric:        Mood and Affect: Mood and affect normal.     Comments: Unable to assess     ED Results / Procedures / Treatments    Labs (all labs ordered are listed, but only abnormal results are displayed) Labs Reviewed  COMPREHENSIVE METABOLIC PANEL - Abnormal; Notable for the following components:      Result Value   Glucose, Bld 117 (*)    Calcium 8.4 (*)    Total Protein 5.9 (*)    AST 13 (*)    All other  components within normal limits  ACETAMINOPHEN LEVEL - Abnormal; Notable for the following components:   Acetaminophen (Tylenol), Serum <10 (*)    All other components within normal limits  SALICYLATE LEVEL - Abnormal; Notable for the following components:   Salicylate Lvl <7.0 (*)    All other components within normal limits  LITHIUM LEVEL - Abnormal; Notable for the following components:   Lithium Lvl <0.06 (*)    All other components within normal limits  I-STAT CHEM 8, ED - Abnormal; Notable for the following components:   Glucose, Bld 105 (*)    All other components within normal limits  SARS CORONAVIRUS 2 (TAT 6-24 HRS)  CBC WITH DIFFERENTIAL/PLATELET  ETHANOL  MAGNESIUM  URINALYSIS, ROUTINE W REFLEX MICROSCOPIC  RAPID URINE DRUG SCREEN, HOSP PERFORMED  I-STAT BETA HCG BLOOD, ED ([facility], [facility], [facility] ONLY)  CBG MONITORING, ED    EKG EKG Interpretation  Date/Time:  Sunday December 15 2020 19:27:02 EST Ventricular Rate:  107 PR Interval:    QRS Duration: 96 QT Interval:  324 QTC Calculation: 433 R Axis:   84 Text Interpretation: Sinus tachycardia Confirmed by Tilden Fossa [phone]) on 12/15/2020 7:36:00 PM   Radiology No results found.  Procedures Procedures (including critical care time)  Medications Ordered in ED Medications  sodium chloride 0.9 % bolus 1,000 mL (has no administration in time range)  sodium chloride 0.9 % bolus 1,000 mL (1,000 mLs Intravenous New Bag/Given 12/15/20 2029)  levETIRAcetam (KEPPRA) IVPB 1000 mg/100 mL premix (0 mg Intravenous Stopped 12/15/20 2018)  LORazepam (ATIVAN) injection 2 mg (2 mg Intravenous Given 12/15/20 1927)    ED Course  I have reviewed the  triage vital signs and the nursing notes.  Pertinent labs & imaging results that were available during my care of the patient were reviewed by me and considered in my medical decision making (see chart for details).    MDM Rules/Calculators/A&P                         Pt with hx/o seizure disorder here for evaluation following multiple seizures prior to ED arrival with additional seizure on ED presentation.  She was treated with ativan, keppra load.  About twenty minutes following ativan administration pt awake, alert, mildly slow speech with no focal neuro deficits. She states that she recently recovered from COVID-19, initially sick from January 1 through the 14th. She did have vomiting and diarrhea with that illness but this is now resolved. She is compliant with her Keppra but does occasionally forget a dose maybe 2 to 3 times weekly. She did take it today. She did go out with friends the last couple nights but did not drink heavily.  Labs obtained - no significant electrolyte abnormality.  Do not feel CT head is warranted based on improved neuro exam.  Plan to d/c home with continued keppra at current dose with neurology follow up and seizure precautions.    On repeat assessment patient is drowsy again, has a friend in the room.  She did receive multiple doses of sedating medications.  Will allow metabolization of medications.    Upon discussion with registration and nursing patient will be awake and conversant and then become somnolent.  Current episodes do not appear to be seizure related but more so behavioral in nature.  Discussed referral to behavioral health for further evaluation.  Friend at bedside does report that patient is under increased stress lately.  Plan to d/c when patient  returns to baseline.  Final Clinical Impression(s) / ED Diagnoses Final diagnoses:  Seizure [facility](HCC)    Rx / DC Orders ED Discharge Orders    None       Tilden Fossaees, Keilyn Haggard, MD 12/16/20 0010

## 2020-12-15 NOTE — ED Notes (Addendum)
This RN in room, patient anxious and tearful. This RN reassured patient. Patient started having nystagmus-appearing eye movement during conversation, at that time PERRL BILAT when assessed, patient then made eye contact with staff and movement stopped. Patient intermittently talks to staff. Patient asked for a blanket. Blanket provided per request and assisted patient with repositioning. Female visitor remains at bedside. 1st L of NS still infusing r/t patient bending arm, same straightened and patient and visitor educated regarding importance of keeping arm straight, patient nodded yes in understanding. Dr. Madilyn Hook updated and will make Dr. Clayborne Dana aware of episode.

## 2020-12-15 NOTE — ED Triage Notes (Signed)
Patient presents from a residence due to seizures. 3 prior to EMS arrival, one witnessed by PD - did not become alert in between same. Had 4th seizure with EMS was given 5mg  Versed IM. Was AAOx3 x 10 minutes then had another seizure, was given 2.5mg  Versed IV. They reported she had to be bagged briefly PTA. Upon arrival, RR18, SpO2 100% on RA. Briefly after arrival, nystamus noted then patient had seizure lasting approx 3 minutes, was given Ativan 2mg  IV. Hx of seizures, takes Keppra as prescribed.

## 2020-12-16 LAB — SARS CORONAVIRUS 2 (TAT 6-24 HRS): SARS Coronavirus 2: NEGATIVE

## 2020-12-16 NOTE — ED Notes (Signed)
Patient alert and speaking to registration.

## 2020-12-16 NOTE — Discharge Instructions (Signed)
Continue your keppra as prescribed.  Do not drive for at least six months and until you are cleared by Neurology.

## 2020-12-16 NOTE — ED Notes (Signed)
Patient stated she was uncomfortable in bed, this RN attempted to assist patient with same and patient updated on plan of care and discharge since her fluids are completed. OK for discharge per Dr. Clayborne Dana. This RN offered patient scrub pants, shirt and socks. She started yelling again stating she will walk out naked. Patient started cursing at RN. Patient removed own IV, no active bleeding to site. Patient refused discharge vitals. Patient ambulated away from stretcher with gown, untied. This RN tied gown  and patient threatened to swing at RN. [employer] attempted to speak with patient regarding placing on clothes, she continued to decline. Patient ambulatory to waiting room with steady gait accompanied by female visitor.

## 2020-12-16 NOTE — ED Notes (Signed)
Patient speaking on cell phone at this time.

## 2021-02-14 ENCOUNTER — Other Ambulatory Visit: Payer: Self-pay

## 2021-02-14 ENCOUNTER — Ambulatory Visit (INDEPENDENT_AMBULATORY_CARE_PROVIDER_SITE_OTHER): Payer: Medicaid Other | Admitting: Adult Health

## 2021-02-14 ENCOUNTER — Encounter: Payer: Self-pay | Admitting: Adult Health

## 2021-02-14 VITALS — BP 114/83 | HR 97 | Ht 67.0 in | Wt 181.0 lb

## 2021-02-14 DIAGNOSIS — Z30011 Encounter for initial prescription of contraceptive pills: Secondary | ICD-10-CM | POA: Insufficient documentation

## 2021-02-14 DIAGNOSIS — Z3046 Encounter for surveillance of implantable subdermal contraceptive: Secondary | ICD-10-CM | POA: Diagnosis not present

## 2021-02-14 MED ORDER — NORETHINDRONE 0.35 MG PO TABS
1.0000 | ORAL_TABLET | Freq: Every day | ORAL | 11 refills | Status: DC
Start: 2021-02-14 — End: 2021-08-02

## 2021-02-14 NOTE — Progress Notes (Signed)
  Subjective:     Patient ID: Erin Krueger, female   DOB: 1996-07-02, 25 y.o.   MRN: [id number]  HPI Erin Krueger is a 25 year old white female,married, [id number] in requesting nexplanon be removed having increased headaches, says has migraines with auras. PCP is [facility] at [facility]  Review of Systems +headaches For nexplanon removal Reviewed past medical,surgical, social and family history. Reviewed medications and allergies.     Objective:   Physical Exam BP 114/83 (BP Location: Right Arm, Patient Position: Sitting, Cuff Size: Normal)   Pulse 97   Ht 5\' 7"  (1.702 m)   Wt 181 lb (82.1 kg)   Breastfeeding No   BMI 28.35 kg/m Consent signed, time out called. Left arm cleansed with betadine, and injected with 1.5 cc 2% lidocaine and waited til numb.Under sterile technique a #11 blade was used to make small vertical incision, and a curved forceps was used to easily remove rod. Steri strips applied. Pressure dressing applied.     Upstream - 02/14/21 1226      Pregnancy Intention Screening   Does the patient want to become pregnant in the next year? No    Does the patient's partner want to become pregnant in the next year? No    Would the patient like to discuss contraceptive options today? Yes      Contraception Wrap Up   Current Method Hormonal Implant    End Method Oral Contraceptive    Contraception Counseling Provided Yes          Assessment:     1. Encounter for Nexplanon removal Use condoms x 4 weeks, keep clean and dry x 24 hours, no heavy lifting, keep steri strips on x 72 hours, Keep pressure dressing on x 24 hours. Follow up prn problems.  2. Encounter for initial prescription of contraceptive pills Will rx micronor,can start today and use condoms Meds ordered this encounter  Medications  . norethindrone (MICRONOR) 0.35 MG tablet    Sig: Take 1 tablet (0.35 mg total) by mouth daily.    Dispense:  28 tablet    Refill:  11    Order Specific Question:   Supervising  Provider    Answer:   02/16/21 [2510]      Plan:     Return in 2 months for pap and physical and ROS

## 2021-02-14 NOTE — Patient Instructions (Signed)
Use condoms x 4 weeks, keep clean and dry x 24 hours, no heavy lifting, keep steri strips on x 72 hours, Keep pressure dressing on x 24 hours. Follow up prn problems.  

## 2021-03-02 ENCOUNTER — Other Ambulatory Visit: Payer: Self-pay

## 2021-03-02 ENCOUNTER — Ambulatory Visit (HOSPITAL_COMMUNITY)
Admission: EM | Admit: 2021-03-02 | Discharge: 2021-03-02 | Disposition: A | Discharge status: Home / Self Care | Payer: Medicaid Other | Attending: Family Medicine | Admitting: Family Medicine

## 2021-03-02 ENCOUNTER — Encounter (HOSPITAL_COMMUNITY): Payer: Self-pay | Admitting: Emergency Medicine

## 2021-03-02 DIAGNOSIS — G40909 Epilepsy, unspecified, not intractable, without status epilepticus: Secondary | ICD-10-CM

## 2021-03-02 DIAGNOSIS — J0100 Acute maxillary sinusitis, unspecified: Secondary | ICD-10-CM | POA: Diagnosis not present

## 2021-03-02 DIAGNOSIS — J3089 Other allergic rhinitis: Secondary | ICD-10-CM | POA: Diagnosis not present

## 2021-03-02 MED ORDER — EPINEPHRINE 0.3 MG/0.3ML IJ SOAJ: 0.3000 mg | INTRAMUSCULAR | 0 refills | Status: DC | PRN

## 2021-03-02 MED ORDER — LEVETIRACETAM 1000 MG PO TABS: 1000.0000 mg | ORAL_TABLET | Freq: Two times a day (BID) | ORAL | 0 refills | Status: DC

## 2021-03-02 MED ORDER — AZITHROMYCIN 250 MG PO TABS: ORAL_TABLET | ORAL | 0 refills | Status: DC

## 2021-03-02 NOTE — ED Provider Notes (Signed)
[facility]    CSN: [id number] Arrival date & time: 03/02/21  1656      History   Chief Complaint Chief Complaint  Patient presents with  . Nasal Congestion  . Cough    HPI Erin Krueger is a 25 y.o. female.   Patient presenting today with almost a week of significant congestion, productive cough, severe sinus pain and pressure, headache, chest tightness, malaise.  She denies known fever, chills, body aches, rashes, shortness of breath, abdominal pain, nausea vomiting diarrhea.  Has been taking Claritin-D with no relief.  She notes that she does have a history of seasonal allergies and gets sinus infections at least once a year.  She also notes she has a history of epilepsy with an average of 5 seizures per month on Keppra and she took her last pill today.  She is still waiting to get her insurance to go through so she can get a new primary care provider to refill this.  States without her medicine she will have upwards of 30 seizures per month and she has been hospitalized during these times before.  Tolerates the medication well without side effects.  Has been on this dose for about 2 years now.     Past Medical History:  Diagnosis Date  . ADHD (attention deficit hyperactivity disorder)   . Allergy   . Anxiety   . Asthma   . Eating disorder   . Headache(784.0)   . Kidney stone    kidney stones  . Migraines   . ODD (oppositional defiant disorder)   . PID (acute pelvic inflammatory disease) 01/29/2016  . PTSD (post-traumatic stress disorder)   . Seizures [facility])    diagnosed age 70    Patient Active Problem List   Diagnosis Date Noted  . Encounter for initial prescription of contraceptive pills 02/14/2021  . Encounter for Nexplanon removal 02/14/2021  . Seizures (HCC) 06/21/2020  . Pregnancy examination or test, negative result 02/02/2020  . Severe preeclampsia 12/27/2019  . Obesity affecting pregnancy in third trimester 11/07/2019  . Adopted person  10/04/2019  . Status epilepticus (HCC) 11/29/2018  . Asthma 11/29/2018  . Recurrent UTI (urinary tract infection) complicating pregnancy 04/08/2018  . Marijuana use 04/07/2018  . Smoker 04/05/2018  . Family history of congenital heart defect 04/05/2018  . 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 Bipolar 1 disorder (HCC) 05/09/2016  . Breakthrough seizure (HCC) 01/07/2016  . Seizure (HCC) 01/07/2016  . 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 PTSD (post-traumatic stress disorder) 05/12/2013  . 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 Polysubstance abuse (HCC) 05/12/2013    Past Surgical History:  Procedure Laterality Date  . right knee surgery    . TONSILLECTOMY AND ADENOIDECTOMY      OB History    Gravida  5   Para  3   Term  2   Preterm  1   AB  2   Living  3     SAB  2   IAB      Ectopic      Multiple  0   Live Births  3            Home Medications    Prior to Admission medications   Medication Sig Start Date End Date Taking? Authorizing Provider  azithromycin (ZITHROMAX Z-PAK) 250 MG tablet Take 2 tabs day 1 then 1 tab daily until complete 03/02/21  Yes Particia Nearing, PA-C  EPINEPHrine 0.3 mg/0.3 mL  IJ SOAJ injection Inject 0.3 mg into the muscle as needed for anaphylaxis. 03/02/21  Yes Particia Nearing, PA-C  levETIRAcetam (KEPPRA) 1000 MG tablet Take 1 tablet (1,000 mg total) by mouth 2 (two) times daily. 03/02/21 04/01/21  Particia Nearing, PA-C  norethindrone (MICRONOR) 0.35 MG tablet Take 1 tablet (0.35 mg total) by mouth daily. 02/14/21   Adline Potter, NP  traZODone (DESYREL) 100 MG tablet Take 100 mg by mouth at bedtime. 06/11/20   [provider]  albuterol (PROVENTIL HFA;VENTOLIN HFA) 108 (90 Base) MCG/ACT inhaler Inhale 2 puffs into the lungs every 6 (six) hours as needed for wheezing or shortness of breath. 04/05/18 11/28/19  Cheral Marker, CNM  metoCLOPramide  (REGLAN) 10 MG tablet Take 1 tablet (10 mg total) by mouth every 6 (six) hours as needed for nausea. 07/19/19 11/28/19  Jacklyn Shell, CNM    Family History Family History  Problem Relation Age of Onset  . Diabetes Mother   . Diabetes Sister   . Breast cancer Maternal Grandmother   . Diabetes Maternal Grandmother   . Glaucoma Maternal Grandmother   . Other Son        born with hole in heart  . Other Daughter        born with hole in heart    Social History Social History   Tobacco Use  . Smoking status: Former Smoker    Packs/day: 0.50    Types: Cigarettes, E-cigarettes  . Smokeless tobacco: Never Used  . Tobacco comment: nicotine patch  Vaping Use  . Vaping Use: Some days  . Substances: Flavoring  Substance Use Topics  . Alcohol use: Not Currently  . Drug use: Not Currently    Types: Marijuana    Comment: Not since 02/13     Allergies   Bee venom, Cashew nut oil, Penicillins, Ativan [lorazepam], Lodine [etodolac], Prednisone, Cocoa butter, and Latex   Review of Systems Review of Systems Per HPI Physical Exam Triage Vital Signs ED Triage Vitals  Enc Vitals Group     BP 03/02/21 1719 112/82     Pulse Rate 03/02/21 1719 66     Resp 03/02/21 1719 18     Temp 03/02/21 1719 98.2 F (36.8 C)     Temp Source 03/02/21 1719 Oral     SpO2 03/02/21 1719 99 %     Weight --      Height --      Head Circumference --      Peak Flow --      Pain Score 03/02/21 1716 3     Pain Loc --      Pain Edu? --      Excl. in GC? --    No data found.  Updated Vital Signs BP 112/82 (BP Location: Right Arm)   Pulse 66   Temp 98.2 F (36.8 C) (Oral)   Resp 18   LMP 02/28/2021   SpO2 99%   Visual Acuity Right Eye Distance:   Left Eye Distance:   Bilateral Distance:    Right Eye Near:   Left Eye Near:    Bilateral Near:     Physical Exam Vitals and nursing note reviewed.  Constitutional:      Appearance: Normal appearance. She is not ill-appearing.   HENT:     Head: Atraumatic.     Right Ear: Tympanic membrane normal.     Left Ear: Tympanic membrane normal.     Nose: Congestion present.  Mouth/Throat:     Mouth: Mucous membranes are moist.     Pharynx: Posterior oropharyngeal erythema present. No oropharyngeal exudate.  Eyes:     Extraocular Movements: Extraocular movements intact.     Conjunctiva/sclera: Conjunctivae normal.  Cardiovascular:     Rate and Rhythm: Normal rate and regular rhythm.     Heart sounds: Normal heart sounds.  Pulmonary:     Effort: Pulmonary effort is normal.     Breath sounds: Normal breath sounds. No wheezing or rales.  Abdominal:     General: Bowel sounds are normal. There is no distension.     Palpations: Abdomen is soft.     Tenderness: There is no abdominal tenderness. There is no guarding.  Musculoskeletal:        General: Normal range of motion.     Cervical back: Normal range of motion and neck supple.  Skin:    General: Skin is warm and dry.  Neurological:     Mental Status: She is alert and oriented to person, place, and time.  Psychiatric:        Mood and Affect: Mood normal.        Thought Content: Thought content normal.        Judgment: Judgment normal.     UC Treatments / Results  Labs (all labs ordered are listed, but only abnormal results are displayed) Labs Reviewed - No data to display  EKG   Radiology No results found.  Procedures Procedures (including critical care time)  Medications Ordered in UC Medications - No data to display  Initial Impression / Assessment and Plan / UC Course  I have reviewed the triage vital signs and the nursing notes.  Pertinent labs & imaging results that were available during my care of the patient were reviewed by me and considered in my medical decision making (see chart for details).     We will treat with Zithromax for her sinus infection in addition to Mucinex, Flonase, antihistamines.  She states she is unable to do  sinus rinses due to her seizure disorder.  We will refill her Keppra as she is actively out of medication and in between primary care providers.  Follow-up as soon as possible primary care to take over refilling this medication.  Return for acutely worsening symptoms.  Final diagnoses:  Acute maxillary sinusitis, recurrence not specified  Seasonal allergic rhinitis due to other allergic trigger  Nonintractable epilepsy without status epilepticus, unspecified epilepsy type [facility])   Discharge Instructions   None    ED Prescriptions    Medication Sig Dispense Auth. Provider   levETIRAcetam (KEPPRA) 1000 MG tablet Take 1 tablet (1,000 mg total) by mouth 2 (two) times daily. 60 tablet Particia Nearing, [place]   azithromycin (ZITHROMAX Z-PAK) 250 MG tablet Take 2 tabs day 1 then 1 tab daily until complete 6 tablet Particia Nearing, PA-C   EPINEPHrine 0.3 mg/0.3 mL IJ SOAJ injection Inject 0.3 mg into the muscle as needed for anaphylaxis. 2 each Particia Nearing, PA-C     PDMP not reviewed this encounter.   Particia Nearing, [place] 03/02/21 1844

## 2021-03-02 NOTE — ED Triage Notes (Signed)
Pt presents with head congestion, productive cough, and sinus pressure xs 3 days. States had chest tightness with cough.

## 2021-03-05 ENCOUNTER — Other Ambulatory Visit: Payer: Self-pay

## 2021-03-05 ENCOUNTER — Emergency Department (HOSPITAL_COMMUNITY)
Admission: EM | Admit: 2021-03-05 | Discharge: 2021-03-05 | Disposition: A | Discharge status: Home / Self Care | Payer: Medicaid Other | Attending: Emergency Medicine | Admitting: Emergency Medicine

## 2021-03-05 DIAGNOSIS — Z79899 Other long term (current) drug therapy: Secondary | ICD-10-CM | POA: Insufficient documentation

## 2021-03-05 DIAGNOSIS — Z91040 Latex allergy status: Secondary | ICD-10-CM | POA: Diagnosis not present

## 2021-03-05 DIAGNOSIS — Z87891 Personal history of nicotine dependence: Secondary | ICD-10-CM | POA: Insufficient documentation

## 2021-03-05 DIAGNOSIS — G40909 Epilepsy, unspecified, not intractable, without status epilepticus: Secondary | ICD-10-CM | POA: Insufficient documentation

## 2021-03-05 DIAGNOSIS — J45909 Unspecified asthma, uncomplicated: Secondary | ICD-10-CM | POA: Insufficient documentation

## 2021-03-05 DIAGNOSIS — R569 Unspecified convulsions: Secondary | ICD-10-CM | POA: Diagnosis present

## 2021-03-05 LAB — CBG MONITORING, ED: Glucose-Capillary: 84 mg/dL (ref 70–99)

## 2021-03-05 NOTE — ED Notes (Signed)
Pt tolerating fluids well. 

## 2021-03-05 NOTE — ED Triage Notes (Signed)
Pt presents to ED POV. Pt found by ED staff actively seizing in vehicle. Pt disoriented, unresponsive to verbal stimuli.

## 2021-03-05 NOTE — ED Notes (Signed)
Discharge instructions discussed with pt. Pt verbalized understanding. Pt stable and ambulatory. Nos ginature pad avialble

## 2021-03-11 NOTE — ED Provider Notes (Signed)
[facility] EMERGENCY DEPARTMENT Provider Note   CSN: [id number] Arrival date & time: 03/05/21  0102     History Chief Complaint  Patient presents with  . Seizures    Erin Krueger is a 25 y.o. female.  History of seizure3s. Had two tonight within a 10 minute span did not return to baseline in between. Thinks it might be because she was little bit late on taking her medications.  She is otherwise usually pretty compliant.  No recent illnesses.  No injuries during the seizure activity.  According to her boyfriend was her normal seizures except the fact that she had 2.   Seizures Seizure activity on arrival: no   Seizure type:  Grand mal Preceding symptoms: no headache   Initial focality:  None Episode characteristics: abnormal movements   Episode characteristics: no apnea   Postictal symptoms: confusion   Return to baseline: yes   Severity:  Mild      Past Medical History:  Diagnosis Date  . ADHD (attention deficit hyperactivity disorder)   . Allergy   . Anxiety   . Asthma   . Eating disorder   . Headache(784.0)   . Kidney stone    kidney stones  . Migraines   . ODD (oppositional defiant disorder)   . PID (acute pelvic inflammatory disease) 01/29/2016  . PTSD (post-traumatic stress disorder)   . Seizures [facility])    diagnosed age 74    Patient Active Problem List   Diagnosis Date Noted  . Encounter for initial prescription of contraceptive pills 02/14/2021  . Encounter for Nexplanon removal 02/14/2021  . Seizures (HCC) 06/21/2020  . Pregnancy examination or test, negative result 02/02/2020  . Severe preeclampsia 12/27/2019  . Obesity affecting pregnancy in third trimester 11/07/2019  . Adopted person 10/04/2019  . Status epilepticus (HCC) 11/29/2018  . Asthma 11/29/2018  . Recurrent UTI (urinary tract infection) complicating pregnancy 04/08/2018  . Marijuana use 04/07/2018  . Smoker 04/05/2018  . Family history of congenital heart defect  04/05/2018  . 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 Bipolar 1 disorder (HCC) 05/09/2016  . Breakthrough seizure (HCC) 01/07/2016  . Seizure (HCC) 01/07/2016  . 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 PTSD (post-traumatic stress disorder) 05/12/2013  . 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 Polysubstance abuse (HCC) 05/12/2013    Past Surgical History:  Procedure Laterality Date  . right knee surgery    . TONSILLECTOMY AND ADENOIDECTOMY       OB History    Gravida  5   Para  3   Term  2   Preterm  1   AB  2   Living  3     SAB  2   IAB      Ectopic      Multiple  0   Live Births  3           Family History  Problem Relation Age of Onset  . Diabetes Mother   . Diabetes Sister   . Breast cancer Maternal Grandmother   . Diabetes Maternal Grandmother   . Glaucoma Maternal Grandmother   . Other Son        born with hole in heart  . Other Daughter        born with hole in heart    Social History   Tobacco Use  . Smoking status: Former Smoker    Packs/day: 0.50    Types: Cigarettes, E-cigarettes  . Smokeless tobacco:  Never Used  . Tobacco comment: nicotine patch  Vaping Use  . Vaping Use: Some days  . Substances: Flavoring  Substance Use Topics  . Alcohol use: Not Currently  . Drug use: Not Currently    Types: Marijuana    Comment: Not since 02/13    Home Medications Prior to Admission medications   Medication Sig Start Date End Date Taking? Authorizing Provider  azithromycin (ZITHROMAX Z-PAK) 250 MG tablet Take 2 tabs day 1 then 1 tab daily until complete 03/02/21   Particia Nearing, PA-C  EPINEPHrine 0.3 mg/0.3 mL IJ SOAJ injection Inject 0.3 mg into the muscle as needed for anaphylaxis. 03/02/21   Particia Nearing, PA-C  levETIRAcetam (KEPPRA) 1000 MG tablet Take 1 tablet (1,000 mg total) by mouth 2 (two) times daily. 03/02/21 04/01/21  Particia Nearing, PA-C  norethindrone  (MICRONOR) 0.35 MG tablet Take 1 tablet (0.35 mg total) by mouth daily. 02/14/21   Adline Potter, NP  traZODone (DESYREL) 100 MG tablet Take 100 mg by mouth at bedtime. 06/11/20   [provider]  albuterol (PROVENTIL HFA;VENTOLIN HFA) 108 (90 Base) MCG/ACT inhaler Inhale 2 puffs into the lungs every 6 (six) hours as needed for wheezing or shortness of breath. 04/05/18 11/28/19  Cheral Marker, CNM  metoCLOPramide (REGLAN) 10 MG tablet Take 1 tablet (10 mg total) by mouth every 6 (six) hours as needed for nausea. 07/19/19 11/28/19  Cresenzo-Dishmon, Scarlette Calico, CNM    Allergies    Bee venom, Cashew nut oil, Penicillins, Ativan [lorazepam], Lodine [etodolac], Prednisone, Cocoa butter, and Latex  Review of Systems   Review of Systems  Neurological: Positive for seizures.  All other systems reviewed and are negative.   Physical Exam Updated Vital Signs BP (!) 111/94   Pulse 85   Temp (!) 97.5 F (36.4 C) (Temporal)   Resp 15   LMP 02/28/2021   SpO2 100%   Physical Exam Vitals and nursing note reviewed.  Constitutional:      Appearance: She is well-developed.  HENT:     Head: Normocephalic and atraumatic.     Mouth/Throat:     Mouth: Mucous membranes are moist.     Pharynx: Oropharynx is clear.  Eyes:     Pupils: Pupils are equal, round, and reactive to light.  Cardiovascular:     Rate and Rhythm: Normal rate and regular rhythm.  Pulmonary:     Effort: No respiratory distress.     Breath sounds: No stridor.  Abdominal:     General: Abdomen is flat. There is no distension.  Musculoskeletal:        General: No swelling or tenderness. Normal range of motion.     Cervical back: Normal range of motion.  Skin:    General: Skin is warm and dry.  Neurological:     General: No focal deficit present.     Mental Status: She is alert.     ED Results / Procedures / Treatments   Labs (all labs ordered are listed, but only abnormal results are displayed) Labs Reviewed   CBG MONITORING, ED    EKG EKG Interpretation  Date/Time:  Wednesday March 05 2021 01:11:17 EDT Ventricular Rate:  94 PR Interval:    QRS Duration: 98 QT Interval:  339 QTC Calculation: 424 R Axis:   79 Text Interpretation: Normal sinus rhythm Borderline Q waves in inferior leads Confirmed by Margarita Grizzle [phone]) on 03/06/2021 1:22:11 PM   Radiology No results found.  Procedures Procedures  Medications Ordered in ED Medications - No data to display  ED Course  I have reviewed the triage vital signs and the nursing notes.  Pertinent labs & imaging results that were available during my care of the patient were reviewed by me and considered in my medical decision making (see chart for details).    MDM Rules/Calculators/A&P                          Breakthrough seizure secondary to not perfect compliance.  Patient returned to baseline and is asking to be discharged.  I do not feel work-up is needed at this time.  EKG and glucose are normal.   Final Clinical Impression(s) / ED Diagnoses Final diagnoses:  Seizure disorder [facility])    Rx / DC Orders ED Discharge Orders    None       Davin Archuletta, Barbara Cower, MD 03/11/21 [phone]

## 2021-04-16 ENCOUNTER — Other Ambulatory Visit: Payer: Medicaid Other | Admitting: Adult Health

## 2021-04-30 ENCOUNTER — Other Ambulatory Visit: Payer: Self-pay

## 2021-04-30 ENCOUNTER — Emergency Department (HOSPITAL_COMMUNITY)
Admission: EM | Admit: 2021-04-30 | Discharge: 2021-05-01 | Disposition: A | Discharge status: Home / Self Care | Payer: Medicaid Other | Attending: Emergency Medicine | Admitting: Emergency Medicine

## 2021-04-30 DIAGNOSIS — R197 Diarrhea, unspecified: Secondary | ICD-10-CM | POA: Insufficient documentation

## 2021-04-30 DIAGNOSIS — R102 Pelvic and perineal pain: Secondary | ICD-10-CM | POA: Diagnosis not present

## 2021-04-30 DIAGNOSIS — J45909 Unspecified asthma, uncomplicated: Secondary | ICD-10-CM | POA: Diagnosis not present

## 2021-04-30 DIAGNOSIS — Z91040 Latex allergy status: Secondary | ICD-10-CM | POA: Insufficient documentation

## 2021-04-30 DIAGNOSIS — R112 Nausea with vomiting, unspecified: Secondary | ICD-10-CM | POA: Diagnosis not present

## 2021-04-30 DIAGNOSIS — R1031 Right lower quadrant pain: Secondary | ICD-10-CM | POA: Diagnosis not present

## 2021-04-30 DIAGNOSIS — Z87891 Personal history of nicotine dependence: Secondary | ICD-10-CM | POA: Diagnosis not present

## 2021-04-30 DIAGNOSIS — N3000 Acute cystitis without hematuria: Secondary | ICD-10-CM

## 2021-05-01 ENCOUNTER — Emergency Department (HOSPITAL_COMMUNITY): Payer: Medicaid Other

## 2021-05-01 ENCOUNTER — Encounter (HOSPITAL_COMMUNITY): Payer: Self-pay

## 2021-05-01 LAB — COMPREHENSIVE METABOLIC PANEL
ALT: 18 U/L (ref 0–44)
AST: 15 U/L (ref 15–41)
Albumin: 3.5 g/dL (ref 3.5–5.0)
Alkaline Phosphatase: 69 U/L (ref 38–126)
Anion gap: 9 (ref 5–15)
BUN: 10 mg/dL (ref 6–20)
CO2: 25 mmol/L (ref 22–32)
Calcium: 8.7 mg/dL — ABNORMAL LOW (ref 8.9–10.3)
Chloride: 106 mmol/L (ref 98–111)
Creatinine, Ser: 0.64 mg/dL (ref 0.44–1.00)
GFR, Estimated: >60 mL/min (ref >60)
Glucose, Bld: 99 mg/dL (ref 70–99)
Potassium: 3.9 mmol/L (ref 3.5–5.1)
Sodium: 140 mmol/L (ref 135–145)
Total Bilirubin: 0.3 mg/dL (ref 0.3–1.2)
Total Protein: 6.6 g/dL (ref 6.5–8.1)

## 2021-05-01 LAB — URINALYSIS, ROUTINE W REFLEX MICROSCOPIC
Bilirubin Urine: NEGATIVE
Glucose, UA: NEGATIVE mg/dL
Hgb urine dipstick: NEGATIVE
Ketones, ur: NEGATIVE mg/dL
Nitrite: NEGATIVE
Protein, ur: NEGATIVE mg/dL
Specific Gravity, Urine: 1.025 (ref 1.005–1.030)
pH: 6 (ref 5.0–8.0)

## 2021-05-01 LAB — CBC
HCT: 42 % (ref 36.0–46.0)
Hemoglobin: 13.9 g/dL (ref 12.0–15.0)
MCH: 30.8 pg (ref 26.0–34.0)
MCHC: 33.1 g/dL (ref 30.0–36.0)
MCV: 93.1 fL (ref 80.0–100.0)
Platelets: 226 10*3/uL (ref 150–400)
RBC: 4.51 MIL/uL (ref 3.87–5.11)
RDW: 11.2 % — ABNORMAL LOW (ref 11.5–15.5)
WBC: 8 10*3/uL (ref 4.0–10.5)
nRBC: 0 % (ref 0.0–0.2)

## 2021-05-01 LAB — I-STAT BETA HCG BLOOD, ED (MC, WL, AP ONLY): I-stat hCG, quantitative: <5 m[IU]/mL (ref <5)

## 2021-05-01 LAB — LIPASE, BLOOD: Lipase: 45 U/L (ref 11–51)

## 2021-05-01 IMAGING — CT CT ABD-PELV W/ CM
2 of 4 series · 16 of 46 positions shown, 18 images · IV contrast (APPLIED)
Comparison: [DATE]

CLINICAL DATA: Acute, nonlocalized abdominal pain. Right lower
quadrant pain

EXAM:
CT ABDOMEN AND PELVIS WITH CONTRAST
TECHNIQUE: Multidetector CT imaging of the abdomen and pelvis was performed
using the standard protocol following bolus administration of
intravenous contrast.
CONTRAST:  75mL OMNIPAQUE IOHEXOL 300 MG/ML  SOLN

[Series 3: abdomen 5.0 · axial · 0.82mm/px · z∈[+990,+1440]mm · 13 of 102 slices shown, 15 images]
[im 6/102  soft-tissue]
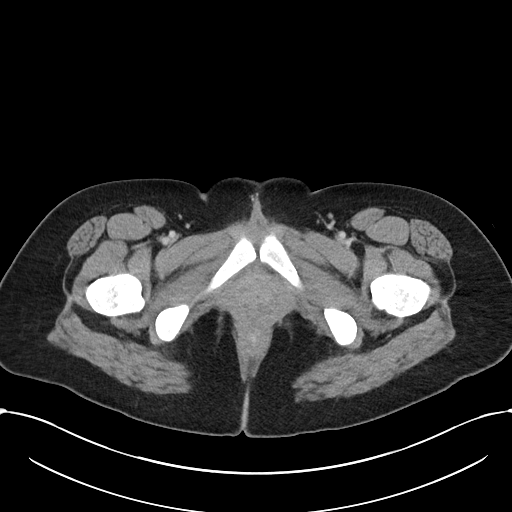
[im 6/102  bone]
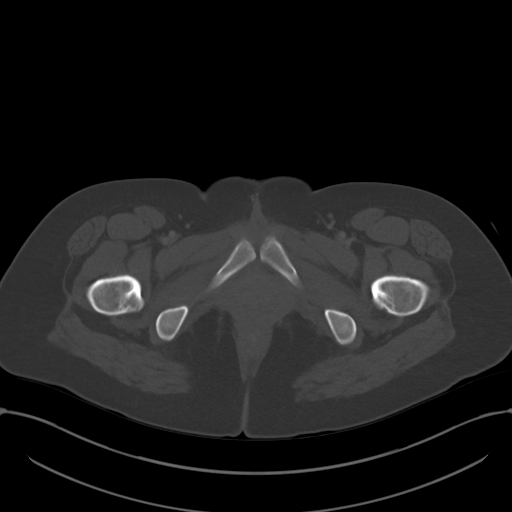
[im 12/102  soft-tissue]
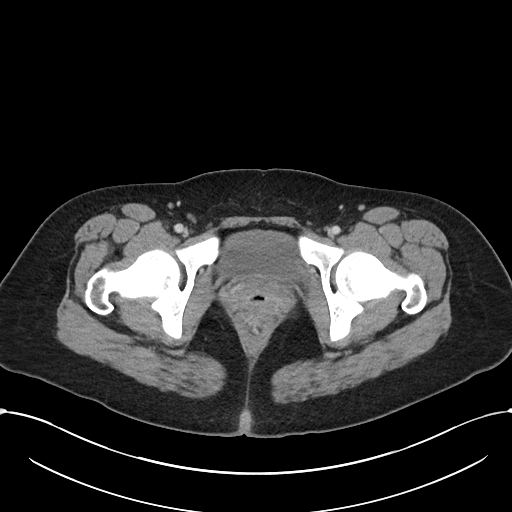
[im 23/102  soft-tissue]
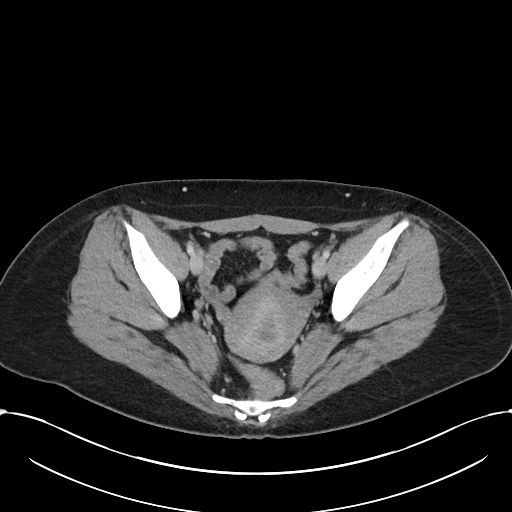
[im 29/102  soft-tissue]
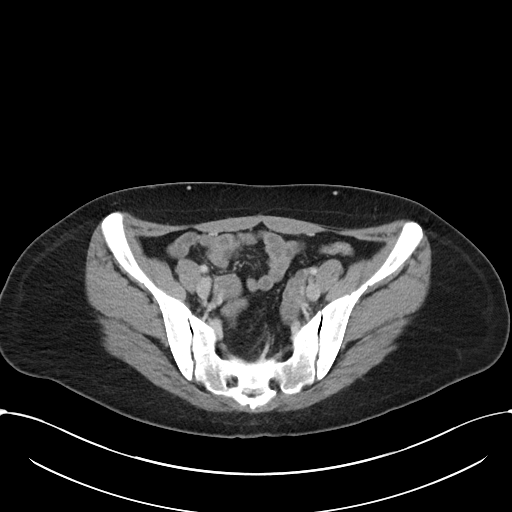
[im 34/102  soft-tissue]
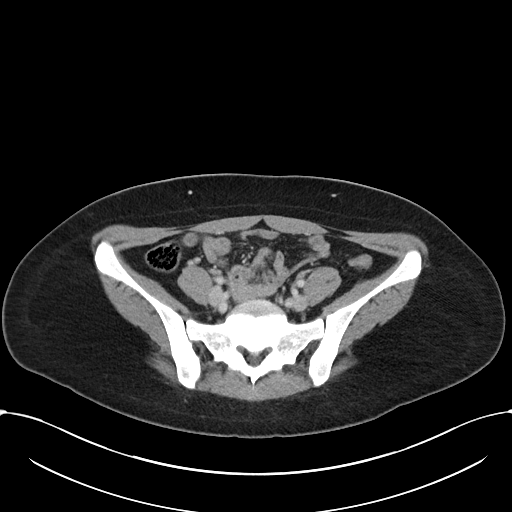
[im 45/102  soft-tissue]
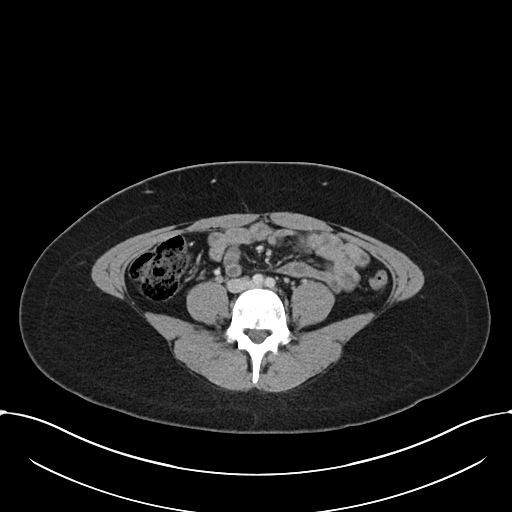
[im 51/102  soft-tissue]
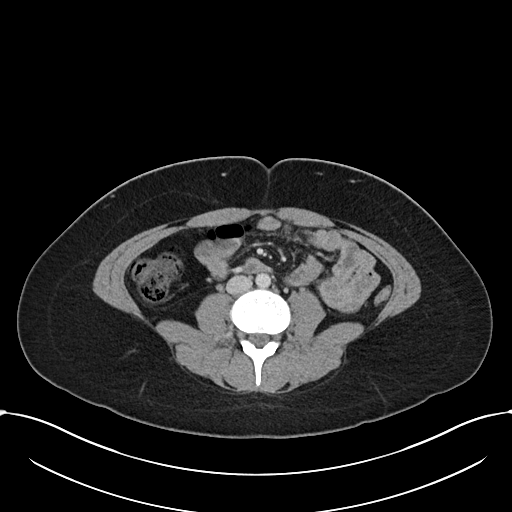
[im 57/102  soft-tissue]
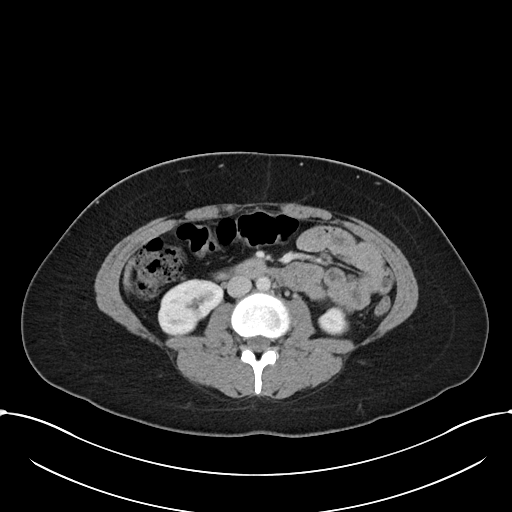
[im 68/102  soft-tissue]
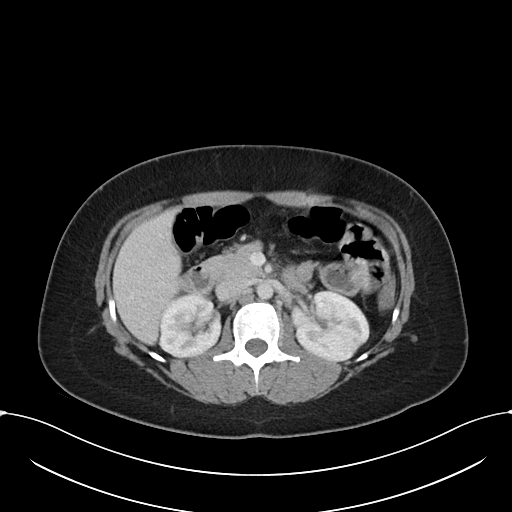
[im 68/102  bone]
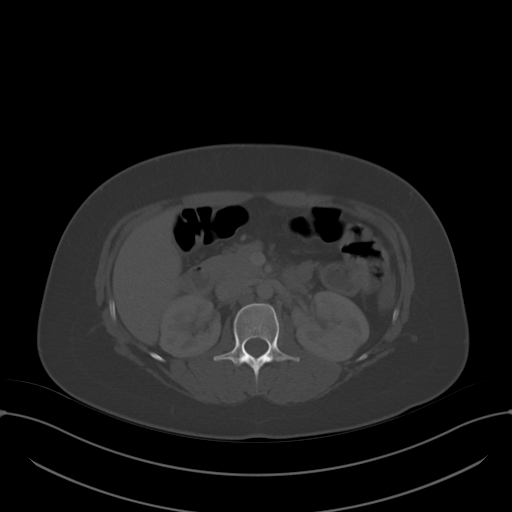
[im 73/102  soft-tissue]
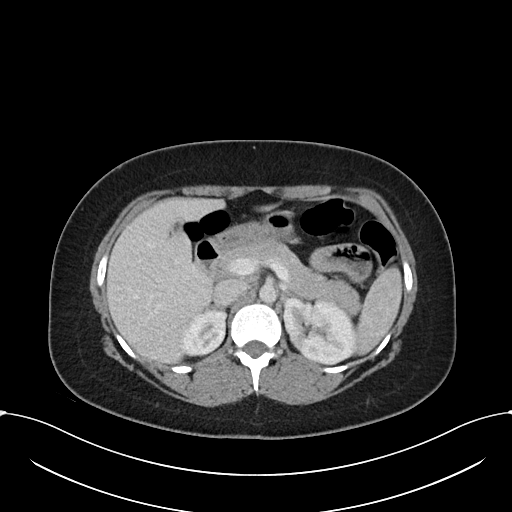
[im 79/102  soft-tissue]
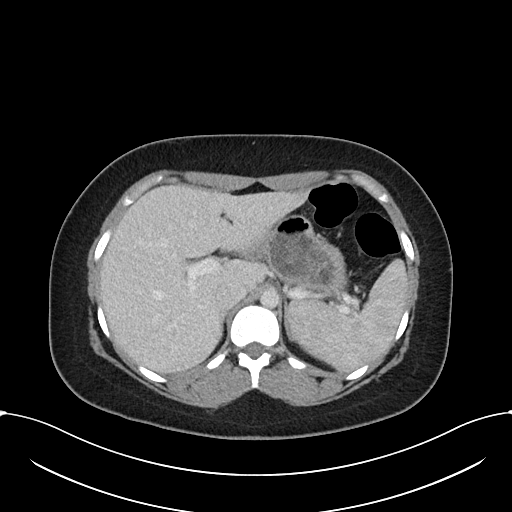
[im 90/102  soft-tissue]
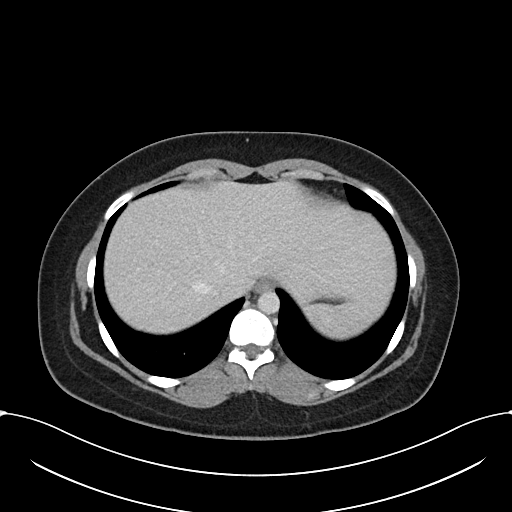
[im 96/102  soft-tissue]
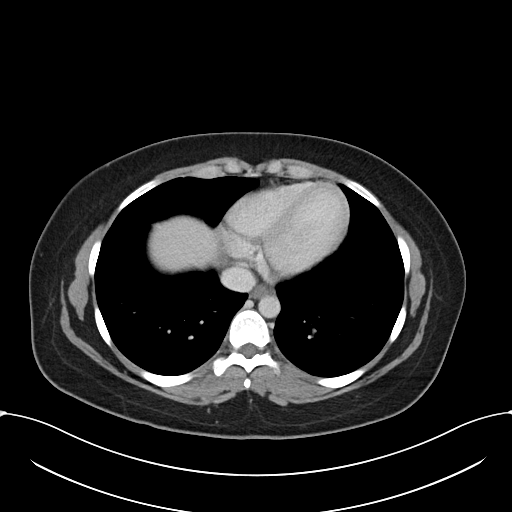

[Series 6: abdomen 3.0 mpr cor · coronal · 0.72mm/px · 3 of 86 slices shown]
[im 29/86  soft-tissue]
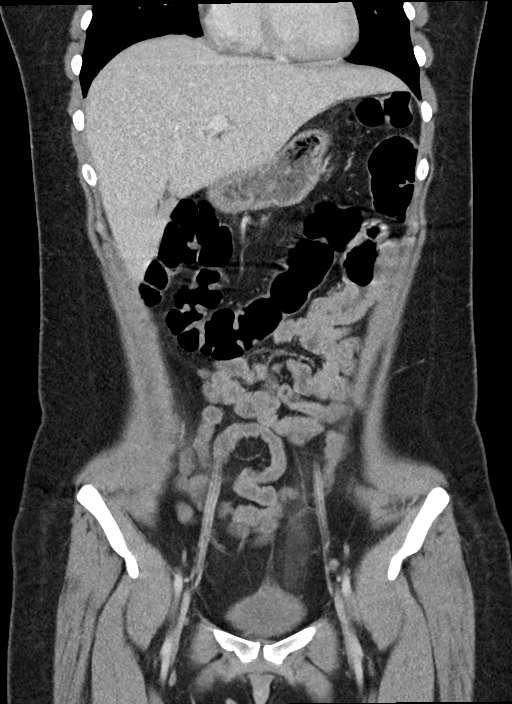
[im 38/86  soft-tissue]
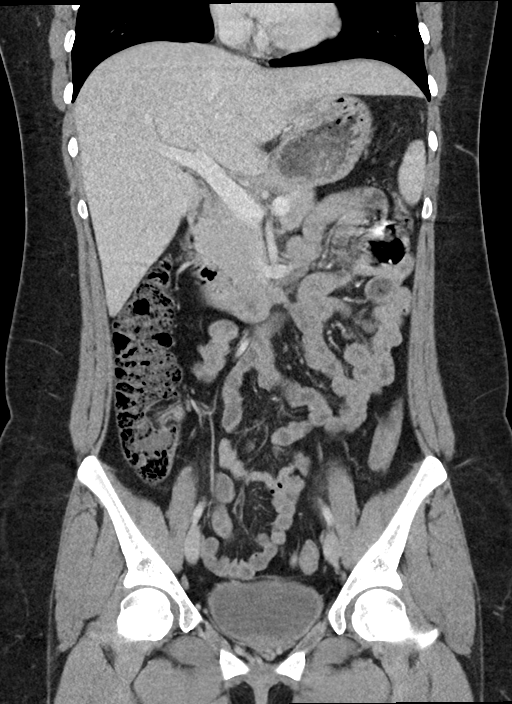
[im 48/86  soft-tissue]
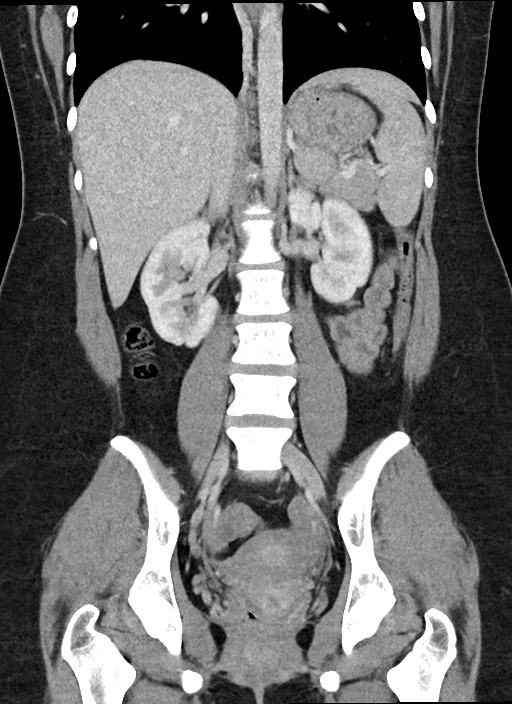

[16 of 46 positions shown; findings below may reference images not displayed]

FINDINGS: Lower chest:  No contributory findings.

Hepatobiliary: No focal liver abnormality.No evidence of biliary
obstruction or stone.

Pancreas: Unremarkable.

Spleen: Unremarkable.

Adrenals/Urinary Tract: Negative adrenals. No hydronephrosis or
ureteral stone. Punctate right renal calculus. Patchy renal cortical
thinning/scarring. There is an area of hypoenhancing renal cortex on
the left but only in an area cortical thinning and contralateral to
side of symptoms. Unremarkable bladder.

Stomach/Bowel:  No obstruction. No appendicitis.

Vascular/Lymphatic: No acute vascular abnormality. No mass or
adenopathy.

Reproductive:No pathologic findings.

Other: No ascites or pneumoperitoneum.

Musculoskeletal: No acute abnormalities.
IMPRESSION: 1. No explanation for RLQ pain.  No appendicitis.
2. Bilateral patchy renal cortical scarring. Punctate right renal
calculus.

## 2021-05-01 IMAGING — US US PELVIS COMPLETE TRANSABD/TRANSVAG W DUPLEX
1 series · 13 of 25 positions shown · non-contrast
Comparison: Ob ultrasound [DATE]

CLINICAL DATA: Pelvic pain for 3 days

EXAM:
TRANSABDOMINAL AND TRANSVAGINAL ULTRASOUND OF PELVIS
DOPPLER ULTRASOUND OF OVARIES
TECHNIQUE: Both transabdominal and transvaginal ultrasound examinations of the
pelvis were performed. Transabdominal technique was performed for
global imaging of the pelvis including uterus, ovaries, adnexal
regions, and pelvic cul-de-sac.
It was necessary to proceed with endovaginal exam following the
transabdominal exam to visualize the adnexa. Color and duplex
Doppler ultrasound was utilized to evaluate blood flow to the
ovaries.

[Series 1: us pelvic complete w transvaginal and torsion righ · 131 acquisitions, 13 frames shown]
[im 1/131]
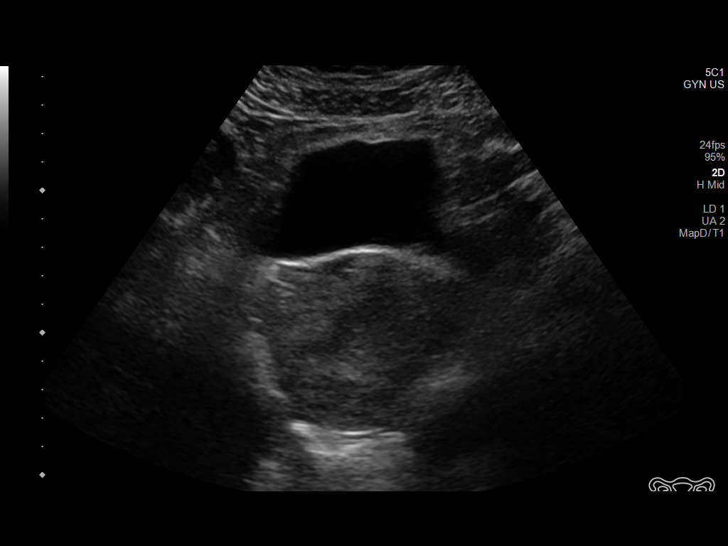
[im 11/131]
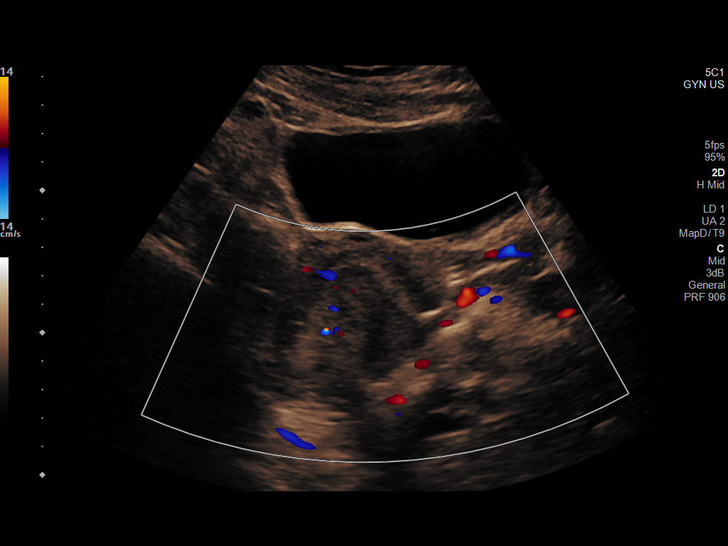
[im 22/131]
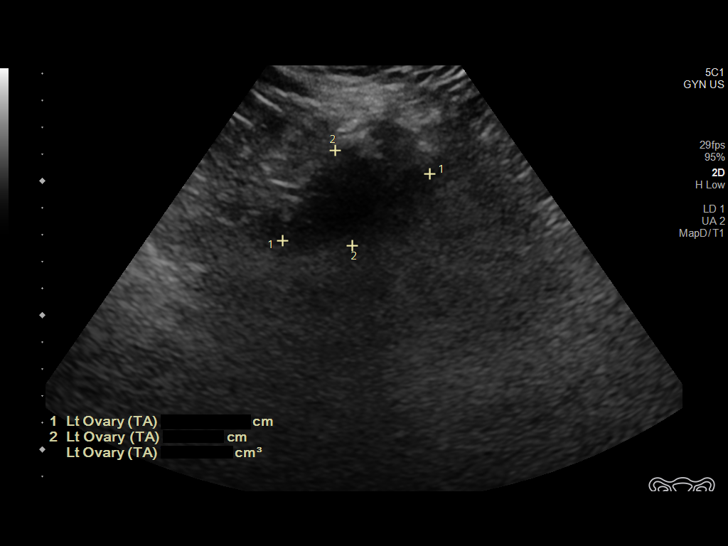
[im 33/131]
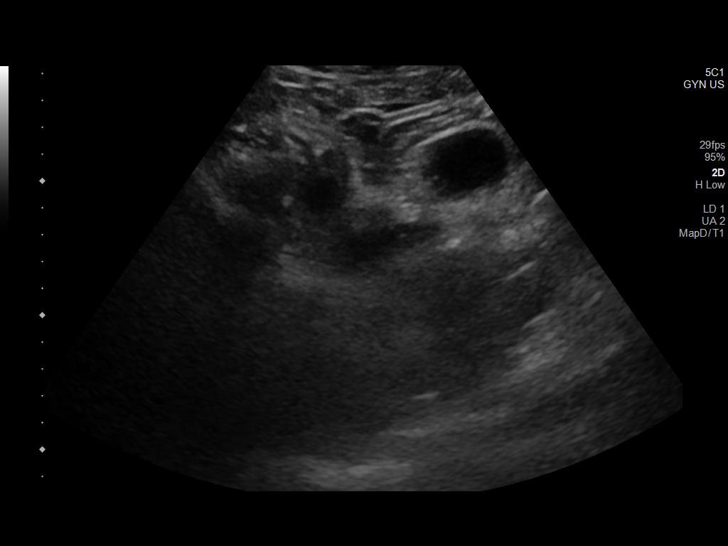
[im 44/131]
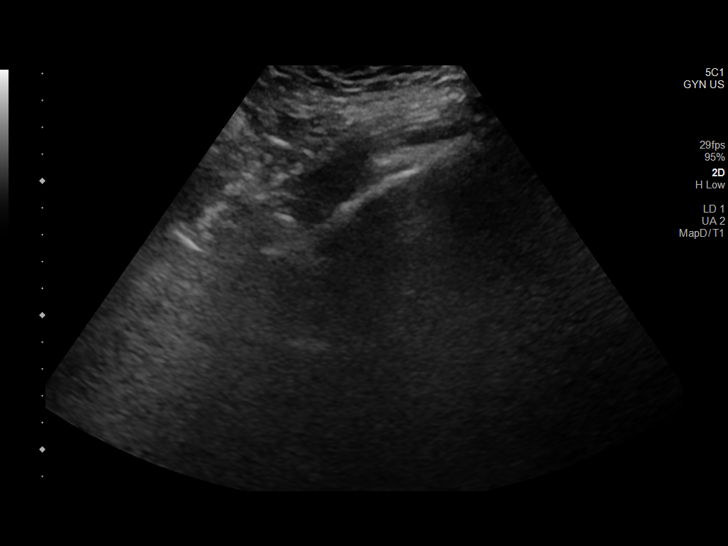
[im 55/131]
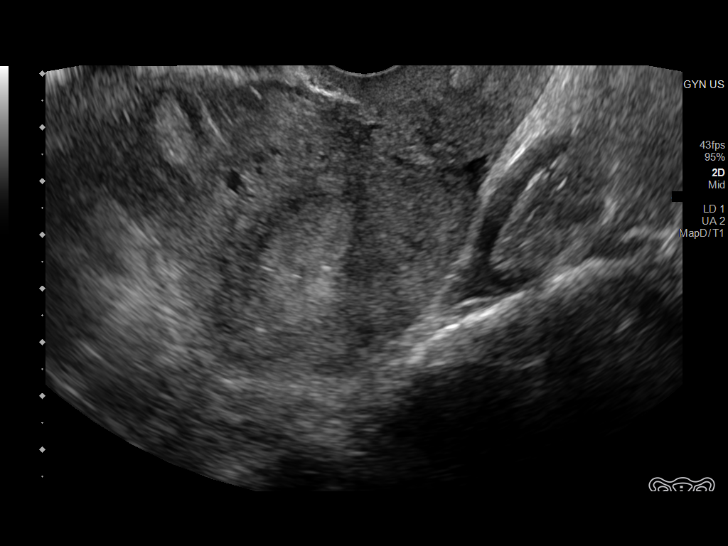
[im 66/131]
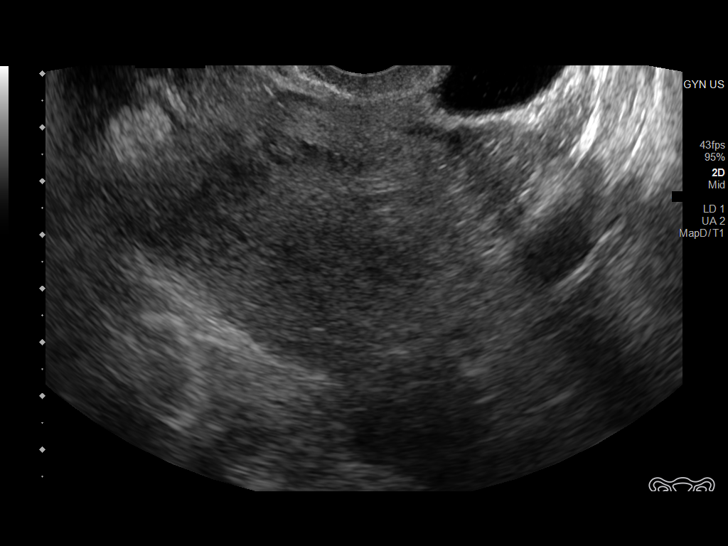
[im 76/131]
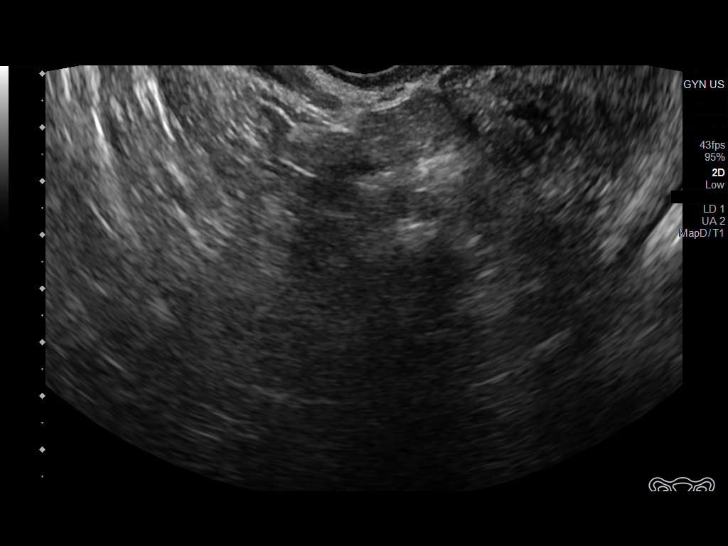
[im 87/131]
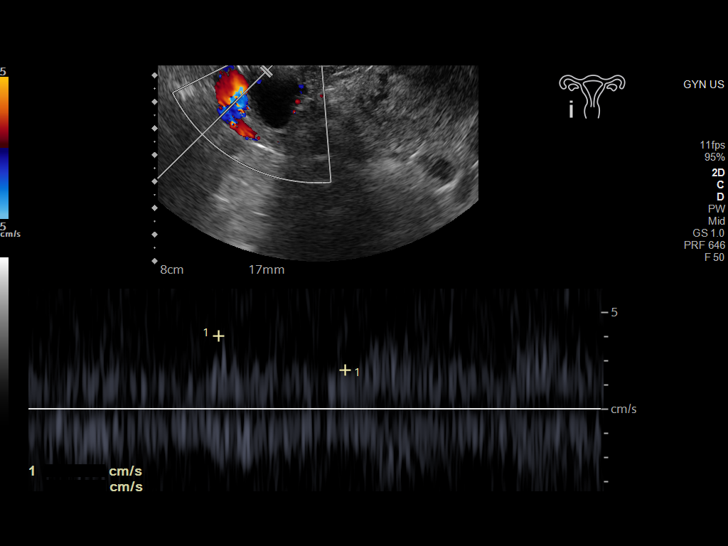
[im 98/131]
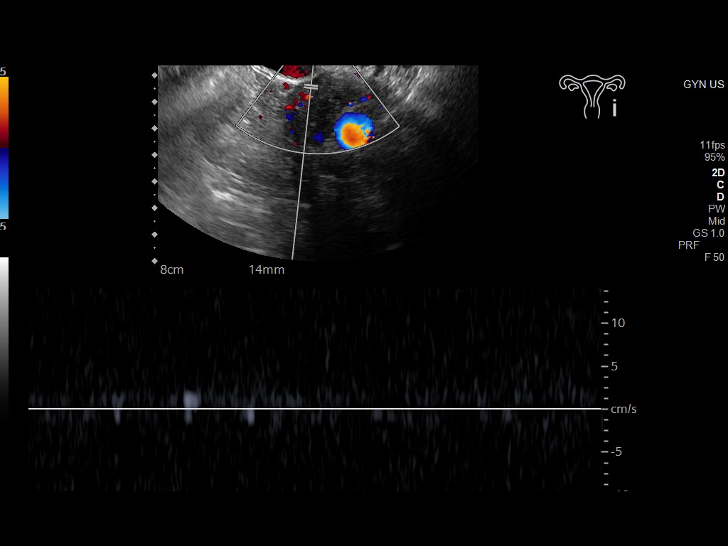
[im 109/131]
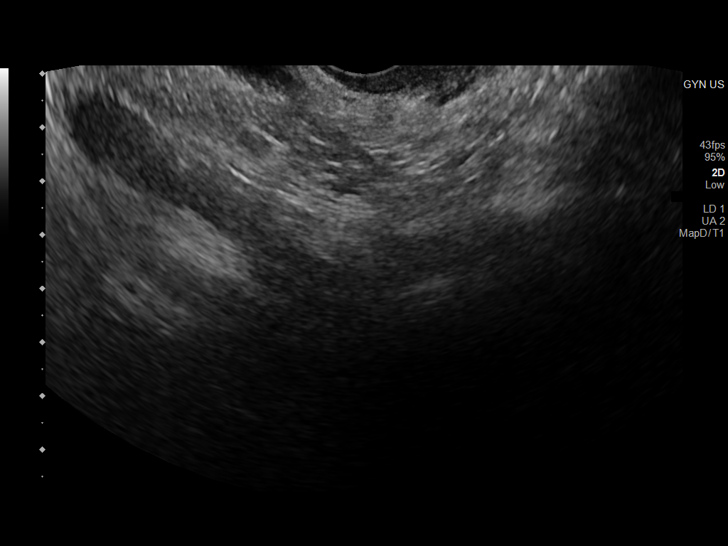
[im 120/131]
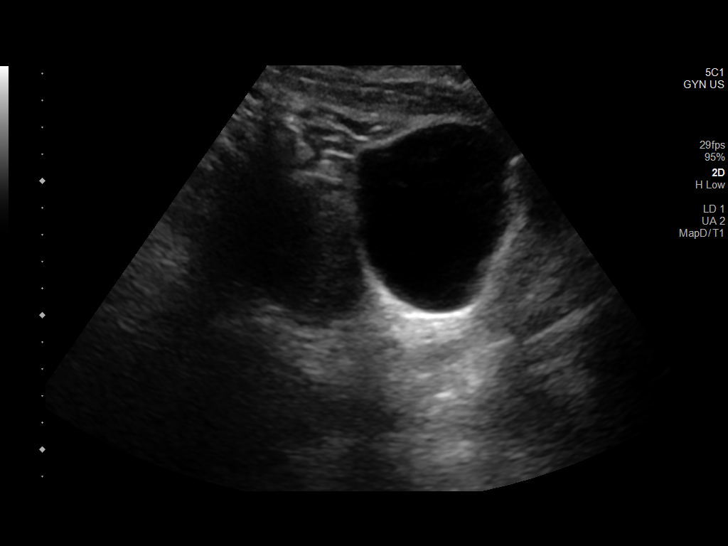
[im 131/131]
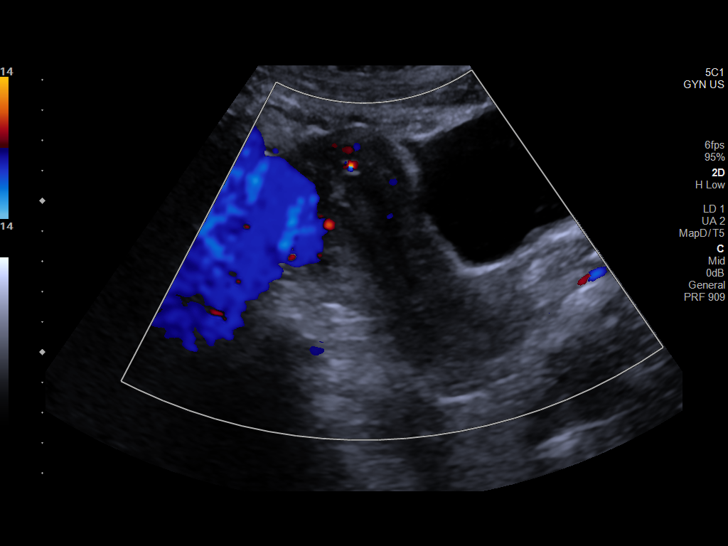

[13 of 25 positions shown; findings below may reference images not displayed]

FINDINGS: Uterus

Measurements: 10 x 6 x 6 cm = volume: 170 mL. No fibroids or other
mass visualized.

Endometrium

Thickness: 17 mm. Trace fluid in the endometrial cavity. A few
echogenic foci are seen in the subendometrial space without
calcification or gas by CT, suspect tiny cysts from adenomyosis.

Right ovary

Measurements: 33 x 27 x 24 mm = volume: 11 mL. Normal appearance/no
adnexal mass.

Left ovary

Measurements: 39 x 28 x 40 mm = volume: 22 mL. Normal appearance/no
adnexal mass.

Pulsed Doppler evaluation of both ovaries demonstrates normal
low-resistance arterial and venous waveforms.

Other findings

No abnormal free fluid.
IMPRESSION: No explanation for pain.  Normal ovarian blood flow.

## 2021-05-01 MED ORDER — FENTANYL CITRATE (PF) 100 MCG/2ML IJ SOLN
75.0000 ug | Freq: Once | INTRAMUSCULAR | Status: AC
  Administered 2021-05-01: 75 ug via INTRAVENOUS
  Filled 2021-05-01: qty 2

## 2021-05-01 MED ORDER — IOHEXOL 300 MG/ML  SOLN
75.0000 mL | Freq: Once | INTRAMUSCULAR | Status: AC | PRN
  Administered 2021-05-01: 75 mL via INTRAVENOUS

## 2021-05-01 MED ORDER — NITROFURANTOIN MONOHYD MACRO 100 MG PO CAPS: 100.0000 mg | ORAL_CAPSULE | Freq: Two times a day (BID) | ORAL | 0 refills | Status: DC

## 2021-05-01 MED ORDER — MORPHINE SULFATE (PF) 4 MG/ML IV SOLN
4.0000 mg | Freq: Once | INTRAVENOUS | Status: AC
Start: 2021-05-01 — End: 2021-05-01
  Administered 2021-05-01: 4 mg via INTRAVENOUS
  Filled 2021-05-01: qty 1

## 2021-05-01 MED ORDER — ONDANSETRON 4 MG PO TBDP: 4.0000 mg | ORAL_TABLET | Freq: Three times a day (TID) | ORAL | 0 refills | Status: DC | PRN

## 2021-05-01 MED ORDER — NITROFURANTOIN MONOHYD MACRO 100 MG PO CAPS
100.0000 mg | ORAL_CAPSULE | Freq: Once | ORAL | Status: AC
  Administered 2021-05-01: 100 mg via ORAL
  Filled 2021-05-01: qty 1

## 2021-05-01 MED ORDER — PHENAZOPYRIDINE HCL 200 MG PO TABS: 200.0000 mg | ORAL_TABLET | Freq: Three times a day (TID) | ORAL | 0 refills | Status: DC

## 2021-05-01 MED ORDER — ONDANSETRON HCL 4 MG/2ML IJ SOLN
4.0000 mg | Freq: Once | INTRAMUSCULAR | Status: AC
  Administered 2021-05-01: 4 mg via INTRAVENOUS
  Filled 2021-05-01: qty 2

## 2021-05-01 NOTE — ED Provider Notes (Signed)
[facility] EMERGENCY DEPARTMENT Provider Note   CSN: [id number] Arrival date & time: 04/30/21  2353     History Chief Complaint  Patient presents with   Abdominal Pain    Erin Krueger is a 25 y.o. female with a history of appendicitis treated by antibiotics, PID, PTSD, asthma, and anxiety who presents the emergency department with a chief complaint of abdominal pain.  The patient endorses right lower quadrant abdominal pain.  The pain began yesterday and was intermittent around her bellybutton.  When the pain became constant today, it moved into her right lower quadrant.  Pain is worse with walking.  She reports associated chills, nausea, vomiting, and diarrhea.  She characterizes the pain as sharp and stabbing.  No treatment prior to arrival.  She states that she had appendicitis when she was 15 that was treated with antibiotics.  Reports that her pain today feels exactly like when she was diagnosed with appendicitis previously.  She denies vaginal bleeding, vaginal discharge, constipation, dysuria, hematuria, urinary frequency or hesitancy, back pain, or flank pain.  She is currently sexually active with 1 partner, her fianc.  She has no concern for STDs.  Reports that her last menstrual cycle was approximately 3 weeks ago.  The history is provided by medical records. No language interpreter was used.      Past Medical History:  Diagnosis Date   ADHD (attention deficit hyperactivity disorder)    Allergy    Anxiety    Asthma    Eating disorder    Headache(784.0)    Kidney stone    kidney stones   Migraines    ODD (oppositional defiant disorder)    PID (acute pelvic inflammatory disease) 01/29/2016   PTSD (post-traumatic stress disorder)    Seizures (HCC)    diagnosed age 89    Patient Active Problem List   Diagnosis Date Noted   Encounter for initial prescription of contraceptive pills 02/14/2021   Encounter for Nexplanon removal 02/14/2021   Seizures  (HCC) 06/21/2020   Pregnancy examination or test, negative result 02/02/2020   Severe preeclampsia 12/27/2019   Obesity affecting pregnancy in third trimester 11/07/2019   Adopted person 10/04/2019   Status epilepticus (HCC) 11/29/2018   Asthma 11/29/2018   Recurrent UTI (urinary tract infection) complicating pregnancy 04/08/2018   Marijuana use 04/07/2018   Smoker 04/05/2018   Family history of congenital heart defect 04/05/2018   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   Bipolar 1 disorder (HCC) 05/09/2016   Breakthrough seizure (HCC) 01/07/2016   Seizure (HCC) 01/07/2016   Conduct disorder, adolescent-onset type 08/09/2013   PTSD (post-traumatic stress disorder) 05/12/2013   ADHD (attention deficit hyperactivity disorder), combined type 05/12/2013   Polysubstance abuse (HCC) 05/12/2013    Past Surgical History:  Procedure Laterality Date   right knee surgery     TONSILLECTOMY AND ADENOIDECTOMY       OB History     Gravida  5   Para  3   Term  2   Preterm  1   AB  2   Living  3      SAB  2   IAB      Ectopic      Multiple  0   Live Births  3           Family History  Problem Relation Age of Onset   Diabetes Mother    Diabetes Sister    Breast cancer Maternal Grandmother    Diabetes Maternal Grandmother  Glaucoma Maternal Grandmother    Other Son        born with hole in heart   Other Daughter        born with hole in heart    Social History   Tobacco Use   Smoking status: Former    Packs/day: 0.50    Pack years: 0.00    Types: Cigarettes, E-cigarettes   Smokeless tobacco: Never   Tobacco comments:    nicotine patch  Vaping Use   Vaping Use: Some days   Substances: Flavoring  Substance Use Topics   Alcohol use: Not Currently   Drug use: Not Currently    Types: Marijuana    Comment: Not since 02/13    Home Medications Prior to Admission medications   Medication Sig Start Date End Date Taking? Authorizing Provider  azithromycin  (ZITHROMAX Z-PAK) 250 MG tablet Take 2 tabs day 1 then 1 tab daily until complete 03/02/21   Particia Nearing, PA-C  EPINEPHrine 0.3 mg/0.3 mL IJ SOAJ injection Inject 0.3 mg into the muscle as needed for anaphylaxis. 03/02/21   Particia Nearing, PA-C  levETIRAcetam (KEPPRA) 1000 MG tablet Take 1 tablet (1,000 mg total) by mouth 2 (two) times daily. 03/02/21 04/01/21  Particia Nearing, PA-C  norethindrone (MICRONOR) 0.35 MG tablet Take 1 tablet (0.35 mg total) by mouth daily. 02/14/21   Adline Potter, NP  traZODone (DESYREL) 100 MG tablet Take 100 mg by mouth at bedtime. 06/11/20   [provider]  albuterol (PROVENTIL HFA;VENTOLIN HFA) 108 (90 Base) MCG/ACT inhaler Inhale 2 puffs into the lungs every 6 (six) hours as needed for wheezing or shortness of breath. 04/05/18 11/28/19  Cheral Marker, CNM  metoCLOPramide (REGLAN) 10 MG tablet Take 1 tablet (10 mg total) by mouth every 6 (six) hours as needed for nausea. 07/19/19 11/28/19  Cresenzo-Dishmon, Scarlette Calico, CNM    Allergies    Bee venom, Cashew nut oil, Penicillins, Ativan [lorazepam], Lodine [etodolac], Prednisone, Cocoa butter, and Latex  Review of Systems   Review of Systems  Constitutional:  Positive for chills. Negative for activity change and fever.  Respiratory:  Negative for shortness of breath.   Cardiovascular:  Negative for chest pain.  Gastrointestinal:  Positive for abdominal pain, diarrhea, nausea and vomiting. Negative for constipation.  Genitourinary:  Negative for dysuria.  Musculoskeletal:  Negative for back pain.  Skin:  Negative for rash.  Allergic/Immunologic: Negative for immunocompromised state.  Neurological:  Negative for seizures, syncope, weakness, numbness and headaches.  Psychiatric/Behavioral:  Negative for confusion.    Physical Exam Updated Vital Signs BP 107/79 (BP Location: Right Arm)   Pulse 75   Temp 98.6 F (37 C) (Temporal)   Resp 16   SpO2 98%   Physical Exam Vitals  and nursing note reviewed.  Constitutional:      General: She is not in acute distress.    Appearance: She is not ill-appearing, toxic-appearing or diaphoretic.  HENT:     Head: Normocephalic.  Eyes:     Conjunctiva/sclera: Conjunctivae normal.  Cardiovascular:     Rate and Rhythm: Normal rate and regular rhythm.     Heart sounds: No murmur heard.   No friction rub. No gallop.  Pulmonary:     Effort: Pulmonary effort is normal. No respiratory distress.     Breath sounds: No stridor. No wheezing, rhonchi or rales.  Chest:     Chest wall: No tenderness.  Abdominal:     General: There is no  distension.     Palpations: Abdomen is soft. There is no mass.     Tenderness: There is abdominal tenderness. There is rebound. There is no right CVA tenderness, left CVA tenderness or guarding.     Hernia: No hernia is present.     Comments: Tender to palpation in the right lower quadrant with maximal tenderness palpation over McBurney's point.  There is rebound tenderness, but no guarding.  She has a positive obturator sign.  Negative Murphy sign.  No CVA tenderness bilaterally.  Abdomen is soft and nondistended.  Musculoskeletal:        General: No tenderness.     Cervical back: Neck supple.     Right lower leg: No edema.     Left lower leg: No edema.  Skin:    General: Skin is warm.     Findings: No rash.  Neurological:     Mental Status: She is alert.  Psychiatric:        Behavior: Behavior normal.    ED Results / Procedures / Treatments   Labs (all labs ordered are listed, but only abnormal results are displayed) Labs Reviewed  COMPREHENSIVE METABOLIC PANEL - Abnormal; Notable for the following components:      Result Value   Calcium 8.7 (*)    All other components within normal limits  CBC - Abnormal; Notable for the following components:   RDW 11.2 (*)    All other components within normal limits  URINALYSIS, ROUTINE W REFLEX MICROSCOPIC - Abnormal; Notable for the following  components:   APPearance HAZY (*)    Leukocytes,Ua MODERATE (*)    Bacteria, UA FEW (*)    All other components within normal limits  LIPASE, BLOOD  I-STAT BETA HCG BLOOD, ED ([facility], [facility], [facility] ONLY)    EKG None  Radiology CT ABDOMEN PELVIS W CONTRAST  Result Date: 05/01/2021 CLINICAL DATA:  Acute, nonlocalized abdominal pain. Right lower quadrant pain EXAM: CT ABDOMEN AND PELVIS WITH CONTRAST TECHNIQUE: Multidetector CT imaging of the abdomen and pelvis was performed using the standard protocol following bolus administration of intravenous contrast. CONTRAST:  57mL OMNIPAQUE IOHEXOL 300 MG/ML  SOLN COMPARISON:  09/18/2016 FINDINGS: Lower chest:  No contributory findings. Hepatobiliary: No focal liver abnormality.No evidence of biliary obstruction or stone. Pancreas: Unremarkable. Spleen: Unremarkable. Adrenals/Urinary Tract: Negative adrenals. No hydronephrosis or ureteral stone. Punctate right renal calculus. Patchy renal cortical thinning/scarring. There is an area of hypoenhancing renal cortex on the left but only in an area cortical thinning and contralateral to side of symptoms. Unremarkable bladder. Stomach/Bowel:  No obstruction. No appendicitis. Vascular/Lymphatic: No acute vascular abnormality. No mass or adenopathy. Reproductive:No pathologic findings. Other: No ascites or pneumoperitoneum. Musculoskeletal: No acute abnormalities. IMPRESSION: 1. No explanation for RLQ pain.  No appendicitis. 2. Bilateral patchy renal cortical scarring. Punctate right renal calculus. Electronically Signed   By: Marnee Spring M.D.   On: 05/01/2021 04:09    Procedures Procedures   Medications Ordered in ED Medications  fentaNYL (SUBLIMAZE) injection 75 mcg (has no administration in time range)  iohexol (OMNIPAQUE) 300 MG/ML solution 75 mL (75 mLs Intravenous Contrast Given 05/01/21 0321)    ED Course  I have reviewed the triage vital signs and the nursing notes.  Pertinent labs & imaging results that  were available during my care of the patient were reviewed by me and considered in my medical decision making (see chart for details).  Clinical Course as of 05/01/21 0741  Thu May 01, 2021  [id number]  Called and spoke with Dr. Grace Isaac, radiology.  I discussed patient's presenting symptoms and physical exam and concern for appendicitis.  We reviewed the CT together and appendix appears normal.  She does have a small follicle on the right ovary.  There is some scarring noted to the left kidney, but no other acute findings.  Consult appreciated. [MM]    Clinical Course User Index [MM] Cahlil Sattar, Coral Else, PA-C   MDM Rules/Calculators/A&P                          25 year old female with a history of appendicitis treated by antibiotics, PID, PTSD, asthma, and anxiety presenting with abdominal pain, nausea, vomiting, diarrhea, and chills.  Vital signs are reassuring.  Labs and imaging of been reviewed and fully interpreted by me.  No leukocytosis.  No metabolic derangements.  UA with pyuria and leukocyte esterase, but no nitrites.  Clinically, her exam is concerning for appendicitis.  However, CT abdomen pelvis was ordered previously and radiology did not note any acute findings.  Given concern for her exam, I called and discussed with Dr. Grace Isaac, radiology, and we reviewed her exam together.  Appendix is normal-appearing.  There is a follicle on her right ovary.  She has previously had PID in the past.  Given her symptoms, will order pelvic ultrasound for further evaluation of her right lower quadrant pain.  Pelvic ultrasound is unremarkable.  On reevaluation, patient's pain is well controlled.  She is tolerating fluids without difficulty.  We will start the patient on Macrobid, which she has previously tolerated.  She will be discharged home with Pyridium and Zofran.  Doubt pyelonephritis, obstructive uropathy, ovarian torsion, PID, appendicitis, cholecystitis, pancreatitis, viral gastroenteritis,  diverticulitis.  She is hemodynamically stable in no acute distress.  Safe for discharge home with outpatient follow-up as discussed.  Final Clinical Impression(s) / ED Diagnoses Final diagnoses:  Pelvic pain    Rx / DC Orders ED Discharge Orders     None        Barkley Boards, PA-C 05/01/21 0746    Nira Conn, MD 05/01/21 [phone]

## 2021-05-01 NOTE — ED Notes (Signed)
Ultrasound in progress at bedside.

## 2021-05-01 NOTE — ED Triage Notes (Signed)
Pt reports that she began to have RLQ abd pain today, hx of appendicitis without appendectomy, vomiting.

## 2021-05-01 NOTE — ED Notes (Signed)
Called pharmacy to have macrobid tubed asap d/t pt being discharged.

## 2021-05-01 NOTE — ED Notes (Signed)
Reviewed discharge instructions with patient and support person. Follow-up care and medications reviewed. Patient and support person verbalized understanding. Patient A&Ox4, VSS, and ambulatory with steady gait upon discharge.  ?

## 2021-05-01 NOTE — Discharge Instructions (Addendum)
Thank you for allowing me to care for you today in the Emergency Department.   Take 1 tablet of Macrobid 2 times daily for the next 5 days.  Your first dose was given in the emergency department this morning.  Let 1 tablet of Zofran dissolve in your tongue every 8 hours as needed for nausea or vomiting.  You can take 650 mg of Tylenol once every 6 hours for pain.  You can also take Pyridium as prescribed to help with your pain.  Follow-up with primary care if your symptoms do not significantly improve in the next 3 to 4 days.  Return to the emergency department if you develop severe, uncontrollable pain, especially if you have been taking antibiotics over the next few days, if you have uncontrollable vomiting despite taking Zofran, or if you develop other new, concerning symptoms.

## 2021-07-01 ENCOUNTER — Emergency Department (HOSPITAL_COMMUNITY)
Admission: EM | Admit: 2021-07-01 | Discharge: 2021-07-01 | Disposition: A | Discharge status: Home / Self Care | Payer: Medicaid Other | Attending: Emergency Medicine | Admitting: Emergency Medicine

## 2021-07-01 ENCOUNTER — Encounter (HOSPITAL_COMMUNITY): Payer: Self-pay | Admitting: Emergency Medicine

## 2021-07-01 DIAGNOSIS — R569 Unspecified convulsions: Secondary | ICD-10-CM | POA: Insufficient documentation

## 2021-07-01 DIAGNOSIS — Z3A00 Weeks of gestation of pregnancy not specified: Secondary | ICD-10-CM | POA: Insufficient documentation

## 2021-07-01 DIAGNOSIS — O26891 Other specified pregnancy related conditions, first trimester: Secondary | ICD-10-CM | POA: Insufficient documentation

## 2021-07-01 DIAGNOSIS — Z5321 Procedure and treatment not carried out due to patient leaving prior to being seen by health care provider: Secondary | ICD-10-CM | POA: Insufficient documentation

## 2021-07-01 LAB — I-STAT CHEM 8, ED
BUN: 11 mg/dL (ref 6–20)
Calcium, Ion: 1.29 mmol/L (ref 1.15–1.40)
Chloride: 105 mmol/L (ref 98–111)
Creatinine, Ser: 0.6 mg/dL (ref 0.44–1.00)
Glucose, Bld: 71 mg/dL (ref 70–99)
HCT: 41 % (ref 36.0–46.0)
Hemoglobin: 13.9 g/dL (ref 12.0–15.0)
Potassium: 3.5 mmol/L (ref 3.5–5.1)
Sodium: 141 mmol/L (ref 135–145)
TCO2: 26 mmol/L (ref 22–32)

## 2021-07-01 LAB — I-STAT BETA HCG BLOOD, ED (MC, WL, AP ONLY): I-stat hCG, quantitative: >2000 m[IU]/mL — ABNORMAL HIGH (ref <5)

## 2021-07-01 NOTE — ED Notes (Addendum)
Pt states that she is leaving due to the wait time. Pt advised to return if symptoms worsen. IV removed. Pt seen leaving ED entrance.

## 2021-07-01 NOTE — ED Triage Notes (Signed)
Pt BIB EMS. Family witnessed seizure activity. Pt states keppra has been increased lately d/t recurrent seizures.  Pt does state she has had multiple positive home pregnancy tests and is potentially 2 months pregnant.

## 2021-07-01 NOTE — ED Provider Notes (Signed)
Emergency Medicine Provider Triage Evaluation Note  Erin Krueger , a 25 y.o. female  was evaluated in triage.  Pt complains of seizure like activity PTA. Per EMS, husband reports 2 seizures lasting 2 minutes that were 5 minutes apart. Patient recently had her Keppra increased to 1000mg  BID; reports compliance. Denies ETOH, drug use. No recent fevers. Thinks she may be 2 months pregnant.  Review of Systems  Positive: Seizure like activity Negative: Fevers  Physical Exam  BP 110/76 (BP Location: Left Arm)   Pulse (!) 101   Temp 98.4 F (36.9 C) (Oral)   Resp 17   SpO2 98%  Gen:   Awake, no distress   Resp:  Normal effort  MSK:   Moves extremities without difficulty  Other:  GCS 15. Speech is clear, goal oriented. No focal deficits appreciated.   Medical Decision Making  Medically screening exam initiated at 2:22 AM.  Appropriate orders placed.  Breindel was informed that the remainder of the evaluation will be completed by another provider, this initial triage assessment does not replace that evaluation, and the importance of remaining in the ED until their evaluation is complete.  Seizure like activity   [name], PA-C 07/01/21 0225    08/31/21, MD 07/01/21 [phone]

## 2021-07-03 LAB — LEVETIRACETAM LEVEL: Levetiracetam Lvl: <1 ug/mL — ABNORMAL LOW (ref 10.0–40.0)

## 2021-07-17 ENCOUNTER — Other Ambulatory Visit: Payer: Self-pay | Admitting: Obstetrics & Gynecology

## 2021-07-17 DIAGNOSIS — O3680X0 Pregnancy with inconclusive fetal viability, not applicable or unspecified: Secondary | ICD-10-CM

## 2021-07-18 ENCOUNTER — Other Ambulatory Visit: Payer: Self-pay

## 2021-07-18 ENCOUNTER — Ambulatory Visit (INDEPENDENT_AMBULATORY_CARE_PROVIDER_SITE_OTHER): Payer: Medicaid Other

## 2021-07-18 DIAGNOSIS — Z3A09 9 weeks gestation of pregnancy: Secondary | ICD-10-CM

## 2021-07-18 DIAGNOSIS — O3680X0 Pregnancy with inconclusive fetal viability, not applicable or unspecified: Secondary | ICD-10-CM

## 2021-07-18 NOTE — Progress Notes (Signed)
[place] 9 wks,single IUP with YS,FHR 180 BPM,normal ovaries,crl 23.64 mm

## 2021-07-21 ENCOUNTER — Other Ambulatory Visit: Payer: Medicaid Other

## 2021-08-02 ENCOUNTER — Encounter (HOSPITAL_COMMUNITY): Payer: Self-pay | Admitting: Family Medicine

## 2021-08-02 ENCOUNTER — Inpatient Hospital Stay (HOSPITAL_COMMUNITY)
Admission: AD | Admit: 2021-08-02 | Discharge: 2021-08-02 | Disposition: A | Discharge status: Home / Self Care | Payer: Medicaid Other | Attending: Family Medicine | Admitting: Family Medicine

## 2021-08-02 ENCOUNTER — Other Ambulatory Visit: Payer: Self-pay

## 2021-08-02 DIAGNOSIS — Z3A11 11 weeks gestation of pregnancy: Secondary | ICD-10-CM | POA: Diagnosis not present

## 2021-08-02 DIAGNOSIS — O2341 Unspecified infection of urinary tract in pregnancy, first trimester: Secondary | ICD-10-CM

## 2021-08-02 DIAGNOSIS — Z880 Allergy status to penicillin: Secondary | ICD-10-CM | POA: Diagnosis not present

## 2021-08-02 DIAGNOSIS — Z888 Allergy status to other drugs, medicaments and biological substances status: Secondary | ICD-10-CM | POA: Diagnosis not present

## 2021-08-02 DIAGNOSIS — Z87440 Personal history of urinary (tract) infections: Secondary | ICD-10-CM | POA: Insufficient documentation

## 2021-08-02 DIAGNOSIS — Z91030 Bee allergy status: Secondary | ICD-10-CM | POA: Insufficient documentation

## 2021-08-02 LAB — URINALYSIS, ROUTINE W REFLEX MICROSCOPIC
Bilirubin Urine: NEGATIVE
Glucose, UA: NEGATIVE mg/dL
Hgb urine dipstick: NEGATIVE
Ketones, ur: NEGATIVE mg/dL
Nitrite: POSITIVE — AB
Protein, ur: 30 mg/dL — AB
Specific Gravity, Urine: 1.015 (ref 1.005–1.030)
pH: 8 (ref 5.0–8.0)

## 2021-08-02 LAB — URINALYSIS, MICROSCOPIC (REFLEX): WBC, UA: >50 WBC/hpf (ref 0–5)

## 2021-08-02 LAB — WET PREP, GENITAL
Clue Cells Wet Prep HPF POC: NONE SEEN
Sperm: NONE SEEN
Trich, Wet Prep: NONE SEEN
Yeast Wet Prep HPF POC: NONE SEEN

## 2021-08-02 MED ORDER — FOSFOMYCIN TROMETHAMINE 3 G PO PACK: 3.0000 g | PACK | Freq: Once | ORAL | 0 refills | Status: AC

## 2021-08-02 NOTE — MAU Provider Note (Signed)
Chief Complaint:  Abdominal Pain, Possible UTI, Emesis, and Nausea   Event Date/Time   First Provider Initiated Contact with Patient 08/02/21 1228     HPI: Erin Krueger is a 25 y.o. [id number] at [redacted]w[redacted]d who presents to maternity admissions reporting possible UTI. Patient reports she went to Urgent Care and was told to come here for evaluation. She is reporting cloudy, "smoky", malodorous urine, lower abdominal pain, and urinary frequency since yesterday. She denies burning/pain with urination, hematuria, fever, chills, or flank pain. No vaginal bleeding or discharge. Has had some lower back pain, however resolves with massage. Patient says that she has a history of UTI's with her last pregnancy. Reports she was recently treated for UTI and completed Keflex two weeks ago.   Pregnancy Course:   Past Medical History:  Diagnosis Date   ADHD (attention deficit hyperactivity disorder)    Allergy    Anxiety    Asthma    Eating disorder    Headache(784.0)    Kidney stone    kidney stones   Migraines    ODD (oppositional defiant disorder)    PID (acute pelvic inflammatory disease) 01/29/2016   PTSD (post-traumatic stress disorder)    Seizures (HCC)    diagnosed age 64   OB History  Gravida Para Term Preterm AB Living  6 3 2 1 2 3   SAB IAB Ectopic Multiple Live Births  2     0 3    # Outcome Date GA Lbr Len/2nd Weight Sex Delivery Anes PTL Lv  6 Current           5 Preterm 12/28/19 [redacted]w[redacted]d 08:08 / 00:05 2790 g M Vag-Spont EPI  LIV  4 Term 11/08/18 [redacted]w[redacted]d 00:15 / 00:18 3275 g M Vag-Spont EPI  LIV     Birth Comments: WNL  3 Term 06/23/17 [redacted]w[redacted]d  2948 g F Vag-Spont EPI N LIV     Birth Comments: cardiac defect  2 SAB 2014          1 SAB            Past Surgical History:  Procedure Laterality Date   right knee surgery     TONSILLECTOMY AND ADENOIDECTOMY     Family History  Problem Relation Age of Onset   Diabetes Mother    Diabetes Sister    Breast cancer Maternal Grandmother    Diabetes  Maternal Grandmother    Glaucoma Maternal Grandmother    Other Son        born with hole in heart   Other Daughter        born with hole in heart   Social History   Tobacco Use   Smoking status: Former    Packs/day: 0.50    Types: Cigarettes, E-cigarettes   Smokeless tobacco: Never   Tobacco comments:    nicotine patch  Vaping Use   Vaping Use: Some days   Substances: Flavoring  Substance Use Topics   Alcohol use: Not Currently   Drug use: Not Currently    Types: Marijuana    Comment: Not since 02/13   Allergies  Allergen Reactions   Bee Venom Anaphylaxis   Cashew Nut Oil Anaphylaxis   Penicillins Anaphylaxis    Has patient had a PCN reaction causing immediate rash, facial/tongue/throat swelling, SOB or lightheadedness with hypotension: Unknown Has patient had a PCN reaction causing severe rash involving mucus membranes or skin necrosis: Unknown Has patient had a PCN reaction that required hospitalization: Unknown Has patient had  a PCN reaction occurring within the last 10 years: Unknown If all of the above answers are "NO", then may proceed with Cephalosporin use.    Ativan [Lorazepam]     Aggressive-agitation   Lodine [Etodolac] Nausea And Vomiting and Other (See Comments)    Patient states had vomiting when taking medication, and threw up blood; also complained of severe headaches while on medication   Prednisone Other (See Comments)    Bleeding nose bleeding and internal bleeding   Cocoa Butter Rash   Latex Rash   No medications prior to admission.    I have reviewed patient's Past Medical Hx, Surgical Hx, Family Hx, Social Hx, medications and allergies.   ROS:  Review of Systems  Constitutional: Negative.   Respiratory: Negative.    Cardiovascular: Negative.   Gastrointestinal:  Positive for abdominal pain and nausea. Negative for diarrhea and vomiting.  Genitourinary:  Positive for frequency. Negative for dysuria, flank pain, hematuria, urgency, vaginal  bleeding and vaginal discharge.  Musculoskeletal:  Positive for back pain.  All other systems reviewed and are negative.  Physical Exam  Patient Vitals for the past 24 hrs:  BP Temp Temp src Pulse Resp SpO2 Height Weight  08/02/21 1400 104/73 98.1 F (36.7 C) Oral 77 16 98 % -- --  08/02/21 1205 110/69 98.2 F (36.8 C) Oral 86 19 99 % -- --  08/02/21 1158 -- -- -- -- -- -- 5\' 7"  (1.702 m) 86.9 kg   Constitutional: well-developed, well-nourished female in no acute distress.  Cardiovascular: normal rate Respiratory: normal effort GI: abd soft, non-tender MS: extremities nontender, no edema, normal ROM Neurologic: alert and oriented x 4.  GU: neg CVAT. Pelvic: blind swabs obtained   FHT: 176 bpm via doppler   Labs: Results for orders placed or performed during the hospital encounter of 08/02/21 (from the past 24 hour(s))  Wet prep, genital     Status: Abnormal   Collection Time: 08/02/21 12:39 PM  Result Value Ref Range   Yeast Wet Prep HPF POC NONE SEEN NONE SEEN   Trich, Wet Prep NONE SEEN NONE SEEN   Clue Cells Wet Prep HPF POC NONE SEEN NONE SEEN   WBC, Wet Prep HPF POC MANY (A) NONE SEEN   Sperm NONE SEEN   Urinalysis, Routine w reflex microscopic     Status: Abnormal   Collection Time: 08/02/21  1:10 PM  Result Value Ref Range   Color, Urine YELLOW YELLOW   APPearance CLOUDY (A) CLEAR   Specific Gravity, Urine 1.015 1.005 - 1.030   pH 8.0 5.0 - 8.0   Glucose, UA NEGATIVE NEGATIVE mg/dL   Hgb urine dipstick NEGATIVE NEGATIVE   Bilirubin Urine NEGATIVE NEGATIVE   Ketones, ur NEGATIVE NEGATIVE mg/dL   Protein, ur 30 (A) NEGATIVE mg/dL   Nitrite POSITIVE (A) NEGATIVE   Leukocytes,Ua MODERATE (A) NEGATIVE  Urinalysis, Microscopic (reflex)     Status: Abnormal   Collection Time: 08/02/21  1:10 PM  Result Value Ref Range   RBC / HPF 6-10 0 - 5 RBC/hpf   WBC, UA >50 0 - 5 WBC/hpf   Bacteria, UA FEW (A) NONE SEEN   Squamous Epithelial / LPF 6-10 0 - 5   Non Squamous  Epithelial PRESENT (A) NONE SEEN   WBC Clumps PRESENT    Mucus PRESENT    Amorphous Crystal PRESENT     Imaging:    MAU Course: Orders Placed This Encounter  Procedures   Wet prep, genital  OB Urine Culture   Urinalysis, Routine w reflex microscopic   Urinalysis, Microscopic (reflex)   Discharge patient   Meds ordered this encounter  Medications   fosfomycin (MONUROL) 3 g PACK    Sig: Take 3 g by mouth once for 1 dose.    Dispense:  3 g    Refill:  0    Order Specific Question:   Supervising Provider    Answer:   Reva Bores [[id number]]     MDM: UA +nitrites, urine culture pending Wet prep, GC/CT collected   Assessment: 1. UTI (urinary tract infection) during pregnancy, first trimester   2. [redacted] weeks gestation of pregnancy     Plan: Rx for fosfomycin sent to pharmacy given multiple antibiotic allergies and first trimester Discharge home in stable condition Patient given strict return precautions. Return to MAU as needed Patient to keep appointment at [facility] as scheduled on 9/20     Follow-up Information     [facility] Follow up.   Specialty: Obstetrics and Gynecology Why: as scheduled on 9/20. Return to MAU as needed. Contact information: [address] [place] [address] [phone]                Allergies as of 08/02/2021       Reactions   Bee Venom Anaphylaxis   Cashew Nut Oil Anaphylaxis   Penicillins Anaphylaxis   Has patient had a PCN reaction causing immediate rash, facial/tongue/throat swelling, SOB or lightheadedness with hypotension: Unknown Has patient had a PCN reaction causing severe rash involving mucus membranes or skin necrosis: Unknown Has patient had a PCN reaction that required hospitalization: Unknown Has patient had a PCN reaction occurring within the last 10 years: Unknown If all of the above answers are "NO", then may proceed with Cephalosporin use.   Ativan [lorazepam]     Aggressive-agitation   Lodine [etodolac] Nausea And Vomiting, Other (See Comments)   Patient states had vomiting when taking medication, and threw up blood; also complained of severe headaches while on medication   Prednisone Other (See Comments)   Bleeding nose bleeding and internal bleeding   Cocoa Butter Rash   Latex Rash        Medication List     STOP taking these medications    azithromycin 250 MG tablet Commonly known as: Zithromax Z-Pak   nitrofurantoin (macrocrystal-monohydrate) 100 MG capsule Commonly known as: MACROBID   norethindrone 0.35 MG tablet Commonly known as: MICRONOR       TAKE these medications    EPINEPHrine 0.3 mg/0.3 mL Soaj injection Commonly known as: EPI-PEN Inject 0.3 mg into the muscle as needed for anaphylaxis.   fosfomycin 3 g Pack Commonly known as: MONUROL Take 3 g by mouth once for 1 dose.   levETIRAcetam 1000 MG tablet Commonly known as: Keppra Take 1 tablet (1,000 mg total) by mouth 2 (two) times daily.   ondansetron 4 MG disintegrating tablet Commonly known as: Zofran ODT Take 1 tablet (4 mg total) by mouth every 8 (eight) hours as needed.   phenazopyridine 200 MG tablet Commonly known as: PYRIDIUM Take 1 tablet (200 mg total) by mouth 3 (three) times daily.   traZODone 100 MG tablet Commonly known as: DESYREL Take 100 mg by mouth at bedtime.         Camelia Eng, MSN, CNM 08/02/2021 2:09 PM

## 2021-08-02 NOTE — MAU Note (Signed)
Presents with c/o lower abdominal pain , possible UTI, and N/V.  States abdominal pain is intermittent and sharp.  Denies VB.  Report thinks has UTI because urine is cloudy, denies pain or burning, endorses urinary urgency.  States completed Keflex for UTI 2 weeks ago.  Endorses N/V, able to keep juice down.

## 2021-08-04 LAB — CULTURE, OB URINE: Culture: 80000 — AB

## 2021-08-04 LAB — GC/CHLAMYDIA PROBE AMP (~~LOC~~) NOT AT ARMC
Chlamydia: NEGATIVE
Comment: NEGATIVE
Comment: NORMAL
Neisseria Gonorrhea: NEGATIVE

## 2021-08-11 ENCOUNTER — Other Ambulatory Visit: Payer: Self-pay | Admitting: Obstetrics & Gynecology

## 2021-08-11 DIAGNOSIS — Z3682 Encounter for antenatal screening for nuchal translucency: Secondary | ICD-10-CM

## 2021-08-12 ENCOUNTER — Encounter: Payer: Self-pay | Admitting: Women's Health

## 2021-08-12 ENCOUNTER — Ambulatory Visit (INDEPENDENT_AMBULATORY_CARE_PROVIDER_SITE_OTHER): Payer: Medicaid Other | Admitting: Women's Health

## 2021-08-12 ENCOUNTER — Other Ambulatory Visit: Payer: Self-pay

## 2021-08-12 ENCOUNTER — Ambulatory Visit: Payer: Medicaid Other | Admitting: *Deleted

## 2021-08-12 ENCOUNTER — Ambulatory Visit (INDEPENDENT_AMBULATORY_CARE_PROVIDER_SITE_OTHER): Payer: Medicaid Other

## 2021-08-12 ENCOUNTER — Other Ambulatory Visit (HOSPITAL_COMMUNITY)
Admission: RE | Admit: 2021-08-12 | Discharge: 2021-08-12 | Disposition: A | Discharge status: Home / Self Care | Payer: Medicaid Other | Source: Ambulatory Visit | Attending: Women's Health | Admitting: Women's Health

## 2021-08-12 VITALS — BP 113/76 | HR 79 | Wt 192.0 lb

## 2021-08-12 DIAGNOSIS — Z3682 Encounter for antenatal screening for nuchal translucency: Secondary | ICD-10-CM

## 2021-08-12 DIAGNOSIS — Z3A12 12 weeks gestation of pregnancy: Secondary | ICD-10-CM

## 2021-08-12 DIAGNOSIS — Z113 Encounter for screening for infections with a predominantly sexual mode of transmission: Secondary | ICD-10-CM

## 2021-08-12 DIAGNOSIS — Z3490 Encounter for supervision of normal pregnancy, unspecified, unspecified trimester: Secondary | ICD-10-CM | POA: Insufficient documentation

## 2021-08-12 DIAGNOSIS — Z124 Encounter for screening for malignant neoplasm of cervix: Secondary | ICD-10-CM | POA: Insufficient documentation

## 2021-08-12 DIAGNOSIS — Z3481 Encounter for supervision of other normal pregnancy, first trimester: Secondary | ICD-10-CM

## 2021-08-12 DIAGNOSIS — Z3480 Encounter for supervision of other normal pregnancy, unspecified trimester: Secondary | ICD-10-CM

## 2021-08-12 DIAGNOSIS — F99 Mental disorder, not otherwise specified: Secondary | ICD-10-CM

## 2021-08-12 LAB — POCT URINALYSIS DIPSTICK OB
Blood, UA: NEGATIVE
Glucose, UA: NEGATIVE
Ketones, UA: NEGATIVE
Leukocytes, UA: NEGATIVE
Nitrite, UA: POSITIVE
POC,PROTEIN,UA: NEGATIVE

## 2021-08-12 MED ORDER — ASPIRIN 81 MG PO TBEC: 162.0000 mg | DELAYED_RELEASE_TABLET | Freq: Every day | ORAL | 2 refills | Status: DC

## 2021-08-12 MED ORDER — BLOOD PRESSURE MONITOR MISC: 0 refills | Status: DC

## 2021-08-12 NOTE — Progress Notes (Signed)
[place] 12+4 wks,measurements c/w dates,CRL 63.36 mm,normal ovaries,NB present,NT 2.2 mm,fhr 162 bpm,posterior placenta

## 2021-08-12 NOTE — Patient Instructions (Signed)
Erin Krueger, thank you for choosing our office today! We appreciate the opportunity to meet your healthcare needs. You may receive a short survey by mail, e-mail, or through [employer]. If you are happy with your care we would appreciate if you could take just a few minutes to complete the survey questions. We read all of your comments and take your feedback very seriously. Thank you again for choosing our office.  Center for [employer] [facility] Team at [facility]  [facility] at [facility] ([address] [place], [place] [address]) Entrance C, located off of [address] Free 24/7 valet parking   Nausea & Vomiting Have saltine crackers or pretzels by your bed and eat a few bites before you raise your head out of bed in the morning Eat small frequent meals throughout the day instead of large meals Drink plenty of fluids throughout the day to stay hydrated, just don't drink a lot of fluids with your meals.  This can make your stomach fill up faster making you feel sick Do not brush your teeth right after you eat Products with real ginger are good for nausea, like ginger ale and ginger hard candy Make sure it says made with real ginger! Sucking on sour candy like lemon heads is also good for nausea If your prenatal vitamins make you nauseated, take them at night so you will sleep through the nausea Sea Bands If you feel like you need medicine for the nausea & vomiting please let us know If you are unable to keep any fluids or food down please let us know   Constipation Drink plenty of fluid, preferably water, throughout the day Eat foods high in fiber such as fruits, vegetables, and grains Exercise, such as walking, is a good way to keep your bowels regular Drink warm fluids, especially warm prune juice, or decaf coffee Eat a 1/2 cup of real oatmeal (not instant), 1/2 cup applesauce, and 1/2-1 cup warm prune juice every day If needed, you may take Colace (docusate sodium) stool softener  once or twice a day to help keep the stool soft.  If you still are having problems with constipation, you may take Miralax once daily as needed to help keep your bowels regular.   Home Blood Pressure Monitoring for Patients   Your provider has recommended that you check your blood pressure (BP) at least once a week at home. If you do not have a blood pressure cuff at home, one will be provided for you. Contact your provider if you have not received your monitor within 1 week.   Helpful Tips for Accurate Home Blood Pressure Checks  Don't smoke, exercise, or drink caffeine 30 minutes before checking your BP Use the restroom before checking your BP (a full bladder can raise your pressure) Relax in a comfortable upright chair Feet on the ground Left arm resting comfortably on a flat surface at the level of your heart Legs uncrossed Back supported Sit quietly and don't talk Place the cuff on your bare arm Adjust snuggly, so that only two fingertips can fit between your skin and the top of the cuff Check 2 readings separated by at least one minute Keep a log of your BP readings For a visual, please reference this diagram: http://ccnc.care/bpdiagram  Provider Name: [facility]     Phone: [phone]  Zone 1: ALL CLEAR  Continue to monitor your symptoms:  BP reading is less than 140 (top number) or less than 90 (bottom  number)  No right upper stomach pain No headaches or seeing spots No feeling nauseated or throwing up No swelling in face and hands  Zone 2: CAUTION Call your doctor's office for any of the following:  BP reading is greater than 140 (top number) or greater than 90 (bottom number)  Stomach pain under your ribs in the middle or right side Headaches or seeing spots Feeling nauseated or throwing up Swelling in face and hands  Zone 3: EMERGENCY  Seek immediate medical care if you have any of the following:  BP reading is greater than160 (top number) or greater than  110 (bottom number) Severe headaches not improving with Tylenol Serious difficulty catching your breath Any worsening symptoms from Zone 2    First Trimester of Pregnancy The first trimester of pregnancy is from week 1 until the end of week 12 (months 1 through 3). A week after a sperm fertilizes an egg, the egg will implant on the wall of the uterus. This embryo will begin to develop into a baby. Genes from you and your partner are forming the baby. The female genes determine whether the baby is a boy or a girl. At 6-8 weeks, the eyes and face are formed, and the heartbeat can be seen on ultrasound. At the end of 12 weeks, all the baby's organs are formed.  Now that you are pregnant, you will want to do everything you can to have a healthy baby. Two of the most important things are to get good prenatal care and to follow your health care provider's instructions. Prenatal care is all the medical care you receive before the baby's birth. This care will help prevent, find, and treat any problems during the pregnancy and childbirth. BODY CHANGES Your body goes through many changes during pregnancy. The changes vary from woman to woman.  You may gain or lose a couple of pounds at first. You may feel sick to your stomach (nauseous) and throw up (vomit). If the vomiting is uncontrollable, call your health care provider. You may tire easily. You may develop headaches that can be relieved by medicines approved by your health care provider. You may urinate more often. Painful urination may mean you have a bladder infection. You may develop heartburn as a result of your pregnancy. You may develop constipation because certain hormones are causing the muscles that push waste through your intestines to slow down. You may develop hemorrhoids or swollen, bulging veins (varicose veins). Your breasts may begin to grow larger and become tender. Your nipples may stick out more, and the tissue that surrounds them  (areola) may become darker. Your gums may bleed and may be sensitive to brushing and flossing. Dark spots or blotches (chloasma, mask of pregnancy) may develop on your face. This will likely fade after the baby is born. Your menstrual periods will stop. You may have a loss of appetite. You may develop cravings for certain kinds of food. You may have changes in your emotions from day to day, such as being excited to be pregnant or being concerned that something may go wrong with the pregnancy and baby. You may have more vivid and strange dreams. You may have changes in your hair. These can include thickening of your hair, rapid growth, and changes in texture. Some women also have hair loss during or after pregnancy, or hair that feels dry or thin. Your hair will most likely return to normal after your baby is born. WHAT TO EXPECT AT YOUR PRENATAL  VISITS During a routine prenatal visit: You will be weighed to make sure you and the baby are growing normally. Your blood pressure will be taken. Your abdomen will be measured to track your baby's growth. The fetal heartbeat will be listened to starting around week 10 or 12 of your pregnancy. Test results from any previous visits will be discussed. Your health care provider may ask you: How you are feeling. If you are feeling the baby move. If you have had any abnormal symptoms, such as leaking fluid, bleeding, severe headaches, or abdominal cramping. If you have any questions. Other tests that may be performed during your first trimester include: Blood tests to find your blood type and to check for the presence of any previous infections. They will also be used to check for low iron levels (anemia) and Rh antibodies. Later in the pregnancy, blood tests for diabetes will be done along with other tests if problems develop. Urine tests to check for infections, diabetes, or protein in the urine. An ultrasound to confirm the proper growth and development  of the baby. An amniocentesis to check for possible genetic problems. Fetal screens for spina bifida and Down syndrome. You may need other tests to make sure you and the baby are doing well. HOME CARE INSTRUCTIONS  Medicines Follow your health care provider's instructions regarding medicine use. Specific medicines may be either safe or unsafe to take during pregnancy. Take your prenatal vitamins as directed. If you develop constipation, try taking a stool softener if your health care provider approves. Diet Eat regular, well-balanced meals. Choose a variety of foods, such as meat or vegetable-based protein, fish, milk and low-fat dairy products, vegetables, fruits, and whole grain breads and cereals. Your health care provider will help you determine the amount of weight gain that is right for you. Avoid raw meat and uncooked cheese. These carry germs that can cause birth defects in the baby. Eating four or five small meals rather than three large meals a day may help relieve nausea and vomiting. If you start to feel nauseous, eating a few soda crackers can be helpful. Drinking liquids between meals instead of during meals also seems to help nausea and vomiting. If you develop constipation, eat more high-fiber foods, such as fresh vegetables or fruit and whole grains. Drink enough fluids to keep your urine clear or pale yellow. Activity and Exercise Exercise only as directed by your health care provider. Exercising will help you: Control your weight. Stay in shape. Be prepared for labor and delivery. Experiencing pain or cramping in the lower abdomen or low back is a good sign that you should stop exercising. Check with your health care provider before continuing normal exercises. Try to avoid standing for long periods of time. Move your legs often if you must stand in one place for a long time. Avoid heavy lifting. Wear low-heeled shoes, and practice good posture. You may continue to have sex  unless your health care provider directs you otherwise. Relief of Pain or Discomfort Wear a good support bra for breast tenderness.   Take warm sitz baths to soothe any pain or discomfort caused by hemorrhoids. Use hemorrhoid cream if your health care provider approves.   Rest with your legs elevated if you have leg cramps or low back pain. If you develop varicose veins in your legs, wear support hose. Elevate your feet for 15 minutes, 3-4 times a day. Limit salt in your diet. Prenatal Care Schedule your prenatal visits by the  twelfth week of pregnancy. They are usually scheduled monthly at first, then more often in the last 2 months before delivery. Write down your questions. Take them to your prenatal visits. Keep all your prenatal visits as directed by your health care provider. Safety Wear your seat belt at all times when driving. Make a list of emergency phone numbers, including numbers for family, friends, the hospital, and police and fire departments. General Tips Ask your health care provider for a referral to a local prenatal education class. Begin classes no later than at the beginning of month 6 of your pregnancy. Ask for help if you have counseling or nutritional needs during pregnancy. Your health care provider can offer advice or refer you to specialists for help with various needs. Do not use hot tubs, steam rooms, or saunas. Do not douche or use tampons or scented sanitary pads. Do not cross your legs for long periods of time. Avoid cat litter boxes and soil used by cats. These carry germs that can cause birth defects in the baby and possibly loss of the fetus by miscarriage or stillbirth. Avoid all smoking, herbs, alcohol, and medicines not prescribed by your health care provider. Chemicals in these affect the formation and growth of the baby. Schedule a dentist appointment. At home, brush your teeth with a soft toothbrush and be gentle when you floss. SEEK MEDICAL CARE IF:   You have dizziness. You have mild pelvic cramps, pelvic pressure, or nagging pain in the abdominal area. You have persistent nausea, vomiting, or diarrhea. You have a bad smelling vaginal discharge. You have pain with urination. You notice increased swelling in your face, hands, legs, or ankles. SEEK IMMEDIATE MEDICAL CARE IF:  You have a fever. You are leaking fluid from your vagina. You have spotting or bleeding from your vagina. You have severe abdominal cramping or pain. You have rapid weight gain or loss. You vomit blood or material that looks like coffee grounds. You are exposed to [place] measles and have never had them. You are exposed to fifth disease or chickenpox. You develop a severe headache. You have shortness of breath. You have any kind of trauma, such as from a fall or a car accident. Document Released: 11/03/2001 Document Revised: 03/26/2014 Document Reviewed: 09/19/2013 [facility] Patient Information 2015 [place], [place]. This information is not intended to replace advice given to you by your health care provider. Make sure you discuss any questions you have with your health care provider.

## 2021-08-12 NOTE — Progress Notes (Signed)
INITIAL OBSTETRICAL VISIT Patient name: Erin Krueger MRN [id number]  Date of birth: May 04, 1996 Chief Complaint:   Initial Prenatal Visit  History of Present Illness:   Erin Krueger is a 25 y.o. [phone] Caucasian female at [redacted]w[redacted]d by [place] at 9 weeks with an Estimated Date of Delivery: 02/20/22 being seen today for her initial obstetrical visit.   No LMP recorded (lmp unknown). Patient is pregnant. Her obstetrical history is significant for  SAB x 2, term SVB x2, then 35wk SVB after IOL for severe pre-e. ? h/o cardiac anomalies in children. Told me in Apr 06, 2018 her 1st child died at during surgery to place defibrillator- this did not happen. Per SW notes from 2020/04/06, child is alive and was adopted through foster care. 2nd child w/ ?VSD, and lives w/ family .  Pt states she is very concerned about this baby's heart b/c of hx, and FOB has 'heart problems', wakes in middle of night w/ chest pain. FOB has never had any cardiac dx. 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 Taking Keppra 1,000mg  BID, hasn't missed doses. Last saw Dr. Gerilyn Pilgrim ago (could not recall his name), has moved to [facility] and does not have f/u.   Bipolar/BPD/PTSD- not on meds, states she stopped trazadone w/ +UPT UTI 9/10, rx'd fosfomycin at [facility] visit 9/10. Denies current sx. Has h/o recurrent UTI during last pregnancy. Today she reports no complaints.  Last pap 04/05/18. Results were: NILM w/ HRHPV not done  Depression screen [facility] 2/9 08/12/2021 07/19/2019 04/05/2018  Decreased Interest 0 0 0  Down, Depressed, Hopeless 0 0 0  PHQ - 2 Score 0 0 0  Altered sleeping 1 0 1  Tired, decreased energy 1 0 1  Change in appetite 0 0 0  Feeling bad or failure about yourself  0 0 0  Trouble concentrating 0 0 0  Moving slowly or fidgety/restless 0 0 0  Suicidal thoughts 0 0 0  PHQ-9 Score 2 0 2  Difficult doing work/chores - - Not difficult at all     GAD 7 : Generalized Anxiety Score 08/12/2021   Nervous, Anxious, on Edge 0  Control/stop worrying 0  Worry too much - different things 0  Trouble relaxing 0  Restless 0  Easily annoyed or irritable 1  Afraid - awful might happen 0  Total GAD 7 Score 1     Review of Systems:   Pertinent items are noted in HPI Denies cramping/contractions, leakage of fluid, vaginal bleeding, abnormal vaginal discharge w/ itching/odor/irritation, headaches, visual changes, shortness of breath, chest pain, abdominal pain, severe nausea/vomiting, or problems with urination or bowel movements unless otherwise stated above.  Pertinent History Reviewed:  Reviewed past medical,surgical, social, obstetrical and family history.  Reviewed problem list, medications and allergies. OB History  Gravida Para Term Preterm AB Living  6 3 2 1 2 3   SAB IAB Ectopic Multiple Live Births  2     0 3    # Outcome Date GA Lbr Len/2nd Weight Sex Delivery Anes PTL Lv  6 Current           5 Preterm 12/28/19 [redacted]w[redacted]d 08:08 / 00:05 6 lb 2.4 oz (2.79 kg) M Vag-Spont EPI  LIV     Complications: Eclampsia  4 Term 11/08/18 [redacted]w[redacted]d 00:15 / 00:18 7 lb 3.5 oz (3.275 kg) M Vag-Spont EPI  LIV     Birth Comments: WNL  3 Term 06/23/17 [redacted]w[redacted]d  6 lb  8 oz (2.948 kg) F Vag-Spont EPI N LIV     Birth Comments: cardiac defect  2 SAB 2014          1 SAB            Physical Assessment:   Vitals:   08/12/21 1409  BP: 113/76  Pulse: 79  Weight: 192 lb (87.1 kg)  Body mass index is 30.07 kg/m.       Physical Examination:  General appearance - well appearing, and in no distress  Mental status - alert, oriented to person, place, and time  Psych:  She has a normal mood and affect  Skin - warm and dry, normal color, no suspicious lesions noted  Chest - effort normal, all lung fields clear to auscultation bilaterally  Heart - normal rate and regular rhythm  Abdomen - soft, nontender  Extremities:  No swelling or varicosities noted  Pelvic - VULVA: normal appearing vulva with no masses,  tenderness or lesions  VAGINA: normal appearing vagina with normal color and discharge, no lesions  CERVIX: normal appearing cervix without discharge or lesions, no CMT  Thin prep pap is done w/ reflex HR HPV cotesting  Chaperone: Peggy Dones    TODAY'S NT [place] 12+4 wks,measurements c/w dates,CRL 63.36 mm,normal ovaries,NB present,NT 2.2 mm,fhr 162 bpm,posterior placenta   Results for orders placed or performed in visit on 08/12/21 (from the past 24 hour(s))  POC Urinalysis Dipstick OB   Collection Time: 08/12/21  3:14 PM  Result Value Ref Range   Color, UA     Clarity, UA     Glucose, UA Negative Negative   Bilirubin, UA     Ketones, UA neg    Spec Grav, UA     Blood, UA neg    pH, UA     POC,PROTEIN,UA Negative Negative, Trace, Small (1+), Moderate (2+), Large (3+), 4+   Urobilinogen, UA     Nitrite, UA positive    Leukocytes, UA Negative Negative   Appearance     Odor      Assessment & Plan:  1) Low-Risk Pregnancy [id number] at [redacted]w[redacted]d with an Estimated Date of Delivery: 02/20/22   2) Initial OB visit  3) H/O children w/ ? Cardiac anomalies> does not have custody of kids; Plan fetal echo @ 24wks  4) Biplar/BPD/PTSD> no meds, stopped trazadone w/ +UPT  5) Pseduo-seizures> on Keppra 1,000mg  BID  6) H/O severe pre-e> ASA 162mg , baseline labs today  7) Recent UTI> tx w/ fosfomycin in [facility] 9/10 (d/t 1st trimester, multiple allergies), +nitrites today, asymptomatic, will wait for culture   Meds:  Meds ordered this encounter  Medications   Blood Pressure Monitor MISC    Sig: For regular home bp monitoring during pregnancy    Dispense:  1 each    Refill:  0    Z34.81 Please mail to patient   aspirin 81 MG EC tablet    Sig: Take 2 tablets (162 mg total) by mouth daily. Swallow whole.    Dispense:  180 tablet    Refill:  2    Order Specific Question:   Supervising Provider    Answer:   11/10 H [2510]    Initial labs obtained Continue prenatal vitamins Reviewed n/v  relief measures and warning s/s to report Reviewed recommended weight gain based on pre-gravid BMI Encouraged well-balanced diet Genetic & carrier screening discussed: requests Panorama and NT/IT, declines Horizon  Ultrasound discussed; fetal survey: requested CCNC completed> form faxed if has  or is planning to apply for medicaid The nature of [place] [facility] [employer] with multiple MDs and other Advanced Practice Providers was explained to patient; also emphasized that fellows, residents, and students are part of our team. Does not have home bp cuff. Office bp cuff given: no. Rx sent: yes. Check bp weekly, let us know if consistently >140/90.   Follow-up: Return in about 4 weeks (around 09/09/2021) for LROB, MD or CNM, in person.   Orders Placed This Encounter  Procedures   Urine Culture   Integrated 1   CBC/D/Plt+RPR+Rh+ABO+RubIgG...   Genetic Screening   Protein / creatinine ratio, urine   Pain Management Screening Profile (10S)   Comprehensive metabolic panel   Amb ref to Integrated Behavioral Health   POC Urinalysis Dipstick OB    Cheral Marker CNM, [facility] 08/12/2021

## 2021-08-14 LAB — CBC/D/PLT+RPR+RH+ABO+RUBIGG...
Antibody Screen: NEGATIVE
Basophils Absolute: 0 10*3/uL (ref 0.0–0.2)
Basos: 0 %
EOS (ABSOLUTE): 0.1 10*3/uL (ref 0.0–0.4)
Eos: 1 %
HCV Ab: <0.1 s/co ratio (ref 0.0–0.9)
HIV Screen 4th Generation wRfx: NONREACTIVE
Hematocrit: 37.2 % (ref 34.0–46.6)
Hemoglobin: 12.9 g/dL (ref 11.1–15.9)
Hepatitis B Surface Ag: NEGATIVE
Immature Grans (Abs): 0 10*3/uL (ref 0.0–0.1)
Immature Granulocytes: 0 %
Lymphocytes Absolute: 2.7 10*3/uL (ref 0.7–3.1)
Lymphs: 27 %
MCH: 30.9 pg (ref 26.6–33.0)
MCHC: 34.7 g/dL (ref 31.5–35.7)
MCV: 89 fL (ref 79–97)
Monocytes Absolute: 0.6 10*3/uL (ref 0.1–0.9)
Monocytes: 6 %
Neutrophils Absolute: 6.7 10*3/uL (ref 1.4–7.0)
Neutrophils: 66 %
Platelets: 227 10*3/uL (ref 150–450)
RBC: 4.18 x10E6/uL (ref 3.77–5.28)
RDW: 12.7 % (ref 11.7–15.4)
RPR Ser Ql: NONREACTIVE
Rh Factor: POSITIVE
Rubella Antibodies, IGG: 6.89 index (ref >0.99)
WBC: 10.2 10*3/uL (ref 3.4–10.8)

## 2021-08-14 LAB — COMPREHENSIVE METABOLIC PANEL
ALT: 8 IU/L (ref 0–32)
AST: 11 IU/L (ref 0–40)
Albumin/Globulin Ratio: 2.1 (ref 1.2–2.2)
Albumin: 4.6 g/dL (ref 3.9–5.0)
Alkaline Phosphatase: 73 IU/L (ref 44–121)
BUN/Creatinine Ratio: 16 (ref 9–23)
BUN: 8 mg/dL (ref 6–20)
Bilirubin Total: 0.2 mg/dL (ref 0.0–1.2)
CO2: 21 mmol/L (ref 20–29)
Calcium: 9.1 mg/dL (ref 8.7–10.2)
Chloride: 100 mmol/L (ref 96–106)
Creatinine, Ser: 0.51 mg/dL — ABNORMAL LOW (ref 0.57–1.00)
Globulin, Total: 2.2 g/dL (ref 1.5–4.5)
Glucose: 84 mg/dL (ref 65–99)
Potassium: 4.3 mmol/L (ref 3.5–5.2)
Sodium: 136 mmol/L (ref 134–144)
Total Protein: 6.8 g/dL (ref 6.0–8.5)
eGFR: 134 mL/min/{1.73_m2} (ref >59)

## 2021-08-14 LAB — HCV INTERPRETATION

## 2021-08-14 LAB — INTEGRATED 1
Crown Rump Length: 63.4 mm
Gest. Age on Collection Date: 12.6 weeks
Maternal Age at EDD: 24.9 yr
Nuchal Translucency (NT): 2.2 mm
Number of Fetuses: 1
PAPP-A Value: 1185.5 ng/mL
Weight: 192 [lb_av]

## 2021-08-14 LAB — PROTEIN / CREATININE RATIO, URINE
Creatinine, Urine: 201.8 mg/dL
Protein, Ur: 32.8 mg/dL
Protein/Creat Ratio: 163 mg/g creat (ref 0–200)

## 2021-08-15 LAB — CYTOLOGY - PAP
Chlamydia: NEGATIVE
Comment: NEGATIVE
Comment: NORMAL
Diagnosis: NEGATIVE
Diagnosis: REACTIVE
Neisseria Gonorrhea: NEGATIVE

## 2021-08-17 LAB — URINE CULTURE

## 2021-08-19 ENCOUNTER — Ambulatory Visit (INDEPENDENT_AMBULATORY_CARE_PROVIDER_SITE_OTHER): Payer: Medicaid Other

## 2021-08-19 ENCOUNTER — Other Ambulatory Visit: Payer: Self-pay

## 2021-08-19 ENCOUNTER — Encounter: Payer: Self-pay | Admitting: Women's Health

## 2021-08-19 DIAGNOSIS — O2341 Unspecified infection of urinary tract in pregnancy, first trimester: Secondary | ICD-10-CM

## 2021-08-19 MED ORDER — CEFTRIAXONE SODIUM 1 G IJ SOLR: 1.0000 g | Freq: Once | INTRAMUSCULAR | 0 refills | Status: DC

## 2021-08-19 MED ORDER — CEFTRIAXONE SODIUM 1 G IJ SOLR
1.0000 g | Freq: Once | INTRAMUSCULAR | Status: AC
  Administered 2021-08-19: 1 g via INTRAMUSCULAR

## 2021-08-19 NOTE — Progress Notes (Signed)
   NURSE VISIT- INJECTION  SUBJECTIVE:  Erin Krueger is a 25 y.o. [phone] female here for a Rocephin for treatment for UTI. She is [redacted]w[redacted]d pregnant.   OBJECTIVE:  LMP  (LMP Unknown)   Appears well, in no apparent distress  Injection administered in: Left upper quad. gluteus  Meds ordered this encounter  Medications   DISCONTD: cefTRIAXone (ROCEPHIN) 1 g injection    Sig: Inject 1 g into the muscle once for 1 dose.    Dispense:  1 each    Refill:  0   cefTRIAXone (ROCEPHIN) injection 1 g    Order Specific Question:   Antibiotic Indication:    Answer:   UTI    ASSESSMENT: Pregnancy [redacted]w[redacted]d Rocephin for treatment for UTI PLAN: Follow-up: as scheduled   Drevon Plog A Lyncoln Ledgerwood  08/19/2021 4:32 PM

## 2021-08-20 LAB — PMP SCREEN PROFILE (10S), URINE

## 2021-09-09 ENCOUNTER — Ambulatory Visit (INDEPENDENT_AMBULATORY_CARE_PROVIDER_SITE_OTHER): Payer: Medicaid Other | Admitting: Obstetrics & Gynecology

## 2021-09-09 ENCOUNTER — Encounter: Payer: Medicaid Other | Admitting: Obstetrics & Gynecology

## 2021-09-09 ENCOUNTER — Other Ambulatory Visit: Payer: Self-pay

## 2021-09-09 ENCOUNTER — Encounter: Payer: Self-pay | Admitting: Obstetrics & Gynecology

## 2021-09-09 VITALS — BP 134/86 | HR 95 | Wt 189.2 lb

## 2021-09-09 DIAGNOSIS — Z3A16 16 weeks gestation of pregnancy: Secondary | ICD-10-CM

## 2021-09-09 DIAGNOSIS — O2341 Unspecified infection of urinary tract in pregnancy, first trimester: Secondary | ICD-10-CM

## 2021-09-09 DIAGNOSIS — Z1379 Encounter for other screening for genetic and chromosomal anomalies: Secondary | ICD-10-CM

## 2021-09-09 NOTE — Progress Notes (Signed)
   LOW-RISK PREGNANCY VISIT Patient name: Erin Krueger MRN [id number]  Date of birth: 09-26-1996 Chief Complaint:   Routine Prenatal Visit  History of Present Illness:   Erin Krueger is a 25 y.o. [phone] female at [redacted]w[redacted]d with an Estimated Date of Delivery: 02/20/22 being seen today for ongoing management of a low-risk pregnancy.   Pregnancy complicated by: -h/o severe preeclampsia -Pseudo seizures- on Keppra -prior baby with cardiac abnormalities -UTI- pt recently treated, states she is still concerned that infection present  Of note, pt states she no longer lives in [place], moved to [place] as her husband works in that area.  Depression screen [facility] 2/9 08/12/2021 07/19/2019 04/05/2018  Decreased Interest 0 0 0  Down, Depressed, Hopeless 0 0 0  PHQ - 2 Score 0 0 0  Altered sleeping 1 0 1  Tired, decreased energy 1 0 1  Change in appetite 0 0 0  Feeling bad or failure about yourself  0 0 0  Trouble concentrating 0 0 0  Moving slowly or fidgety/restless 0 0 0  Suicidal thoughts 0 0 0  PHQ-9 Score 2 0 2  Difficult doing work/chores - - Not difficult at all    Today she reports no complaints. Contractions: Not present. Vag. Bleeding: None.  Movement: Absent. denies leaking of fluid. Review of Systems:   Pertinent items are noted in HPI Denies abnormal vaginal discharge w/ itching/odor/irritation, headaches, visual changes, shortness of breath, chest pain, abdominal pain, severe nausea/vomiting, or problems with urination or bowel movements unless otherwise stated above. Pertinent History Reviewed:  Reviewed past medical,surgical, social, obstetrical and family history.  Reviewed problem list, medications and allergies.  Physical Assessment:   Vitals:   09/09/21 1530  BP: 134/86  Pulse: 95  Weight: 189 lb 3.2 oz (85.8 kg)  Body mass index is 29.63 kg/m.        Physical Examination:   General appearance: Well appearing, and in no distress  Mental status: Alert,  oriented to person, place, and time  Skin: Warm & dry  Respiratory: Normal respiratory effort, no distress  Abdomen: Soft, gravid, nontender  Pelvic: Cervical exam deferred         Extremities: Edema: None  Psych:  mood and affect appropriate  Fetal Status: Fetal Heart Rate (bpm): 160   Movement: Absent    Chaperone: n/a    No results found for this or any previous visit (from the past 24 hour(s)).   Assessment & Plan:  1) Low-risk pregnancy [id number] at [redacted]w[redacted]d with an Estimated Date of Delivery: 02/20/22   2) Plan to transfer to [facility] office []  plan to schedule OB visit and anatomy scan in 4wks  3) Recent UTI []  urine culture pending    Meds: No orders of the defined types were placed in this encounter.  Labs/procedures today: doppler  Plan:  Continue routine obstetrical care  Next visit: prefers in person    Reviewed: Preterm labor symptoms and general obstetric precautions including but not limited to vaginal bleeding, contractions, leaking of fluid and fetal movement were reviewed in detail with the patient.  All questions were answered.   Follow-up: Return in about 4 weeks (around 10/07/2021) for anatomy and HROB visit.  Orders Placed This Encounter  Procedures   Urine Culture   INTEGRATED 2    , DO Attending Obstetrician & Gynecologist, [facility] for [facility], [facility]

## 2021-09-11 LAB — INTEGRATED 2
AFP MoM: 1.41
Alpha-Fetoprotein: 41.8 ng/mL
Crown Rump Length: 63.4 mm
DIA MoM: 2.03
DIA Value: 273.9 pg/mL
Estriol, Unconjugated: 0.77 ng/mL
Gest. Age on Collection Date: 12.6 weeks
Gestational Age: 16.6 weeks
Maternal Age at EDD: 24.9 yr
Nuchal Translucency (NT): 2.2 mm
Nuchal Translucency MoM: 1.55
Number of Fetuses: 1
PAPP-A MoM: 1.62
PAPP-A Value: 1185.5 ng/mL
Test Results:: NEGATIVE
Weight: 192 [lb_av]
Weight: 192 [lb_av]
hCG MoM: 1.52
hCG Value: 44.2 IU/mL
uE3 MoM: 0.77

## 2021-09-12 LAB — URINE CULTURE

## 2021-09-25 ENCOUNTER — Telehealth: Payer: Self-pay | Admitting: Clinical

## 2021-09-25 NOTE — Telephone Encounter (Signed)
Left HIPPA-compliant message to call back Jamie from [facility] at  [phone] (Jamie's office).   

## 2021-09-29 NOTE — BH Specialist Note (Addendum)
Integrated Behavioral Health via Telemedicine Visit  09/29/2021 Erin Krueger [id number]  Number of Integrated Behavioral Health visits: 1 Session Start time: 2:16  Session End time: 3:21 Total time:  27  Referring Provider: Shawna Krueger, CNM Patient/Family location: [facility] Provider location: [facility]  All persons participating in visit: [name] and Erin Krueger   Types of Service: Individual psychotherapy and Video visit  I connected with Erin Krueger and/or Erin Krueger's  n/a  via  Telephone or Engineer, civil (consulting)  (Video is Surveyor, mining) and verified that I am speaking with the correct person using two identifiers. Discussed confidentiality: Yes   I discussed the limitations of telemedicine and the availability of in person appointments.  Discussed there is a possibility of technology failure and discussed alternative modes of communication if that failure occurs.  I discussed that engaging in this telemedicine visit, they consent to the provision of behavioral healthcare and the services will be billed under their insurance.  Patient and/or legal guardian expressed understanding and consented to Telemedicine visit: Yes   Presenting Concerns: Patient and/or family reports the following symptoms/concerns: Worry over past (unfounded) CPS case affecting current pregnancy; pt's goals are healthy pregnancy and bringing baby home postpartum, with big goal of bringing older children home. Pt is using many coping strategies to manage emotions.  Duration of problem: Current pregnancy; Severity of problem: mild  Patient and/or Family's Strengths/Protective Factors: Social connections, Concrete supports in place (healthy food, safe environments, etc.), Sense of purpose, Physical Health (exercise, healthy diet, medication compliance, etc.), and Caregiver has knowledge of  parenting & child development  Goals Addressed: Patient will:  Reduce symptoms of: stress   Increase knowledge and/or ability of: healthy habits and stress reduction   Demonstrate ability to: Increase healthy adjustment to current life circumstances  Progress towards Goals: Ongoing  Interventions: Interventions utilized:  Solution-Focused Strategies, Psychoeducation and/or Health Education, and Link to [employer] Standardized Assessments completed:  PHQ9/GAD7 given in past two weeks  Patient and/or Family Response: Pt agrees with treatment plan  Assessment: Patient currently experiencing History of PTSD, History of ADHD (previously diagnosed).   Patient may benefit from psychoeducation and brief therapeutic interventions regarding maintaining emotional wellness .  Plan: Follow up with behavioral health clinician on : Two weeks Behavioral recommendations:  -Continue taking prenatal vitamin, as recommended by medical provider -Continue using healthy self-coping strategies to manage emotions; prioritizing healthy self-care for remainder of pregnancy -Continue attending weekly parenting classes with FOB; continue plan to attend Lamaze classes with FOB -Consider [email or web address] for additional resources/information Referral(s): Integrated [employer] (In Clinic)  I discussed the assessment and treatment plan with the patient and/or parent/guardian. They were provided an opportunity to ask questions and all were answered. They agreed with the plan and demonstrated an understanding of the instructions.   They were advised to call back or seek an in-person evaluation if the symptoms worsen or if the condition fails to improve as anticipated.  Erin Lips, LCSW  Depression screen [facility] 2/9 10/01/2021 08/12/2021 07/19/2019 04/05/2018  Decreased Interest 0 0 0 0  Down, Depressed, Hopeless 0 0 0 0  PHQ - 2 Score 0 0 0 0  Altered sleeping 1 1 0 1  Tired,  decreased energy 0 1 0 1  Change in appetite 0 0 0 0  Feeling bad or failure about yourself  0 0 0 0  Trouble concentrating 0  0 0 0  Moving slowly or fidgety/restless 0 0 0 0  Suicidal thoughts 0 0 0 0  PHQ-9 Score 1 2 0 2  Difficult doing work/chores - - - Not difficult at all   GAD 7 : Generalized Anxiety Score 10/01/2021 08/12/2021  Nervous, Anxious, on Edge 0 0  Control/stop worrying 0 0  Worry too much - different things 0 0  Trouble relaxing 0 0  Restless 0 0  Easily annoyed or irritable 0 1  Afraid - awful might happen 0 0  Total GAD 7 Score 0 1

## 2021-10-01 ENCOUNTER — Ambulatory Visit (INDEPENDENT_AMBULATORY_CARE_PROVIDER_SITE_OTHER): Payer: Medicaid Other | Admitting: Obstetrics and Gynecology

## 2021-10-01 ENCOUNTER — Encounter: Payer: Self-pay | Admitting: Obstetrics and Gynecology

## 2021-10-01 ENCOUNTER — Other Ambulatory Visit: Payer: Self-pay

## 2021-10-01 VITALS — BP 118/88 | HR 101 | Wt 193.9 lb

## 2021-10-01 DIAGNOSIS — O0990 Supervision of high risk pregnancy, unspecified, unspecified trimester: Secondary | ICD-10-CM | POA: Insufficient documentation

## 2021-10-01 DIAGNOSIS — O09299 Supervision of pregnancy with other poor reproductive or obstetric history, unspecified trimester: Secondary | ICD-10-CM

## 2021-10-01 NOTE — Progress Notes (Signed)
A lot of pressure & pain in Vaginal area when she lays down.

## 2021-10-01 NOTE — Progress Notes (Signed)
   PRENATAL VISIT NOTE  Subjective:  Erin Krueger is a 25 y.o. [phone] at [redacted]w[redacted]d being seen today for ongoing prenatal care.  She is currently monitored for the following issues for this high-risk pregnancy and has PTSD (post-traumatic stress disorder); ADHD (attention deficit hyperactivity disorder), combined type; Polysubstance abuse (HCC); Conduct disorder, adolescent-onset type; Seizure (HCC); Pseudoseizures (HCC); Smoker; Family history of congenital heart defect; Recurrent UTI (urinary tract infection) complicating pregnancy; Asthma; Bipolar 1 disorder (HCC); Adopted person; Hx of preeclampsia, prior pregnancy, currently pregnant; and Supervision of high risk pregnancy, antepartum on their problem list.  Transfer of care from [facility] due to convenience of this location.   Patient reports no complaints.  Contractions: Not present. Vag. Bleeding: None.  Movement: Present. Denies leaking of fluid.   The following portions of the patient's history were reviewed and updated as appropriate: allergies, current medications, past family history, past medical history, past social history, past surgical history and problem list.   Objective:   Vitals:   10/01/21 1029  BP: 118/88  Pulse: (!) 101  Weight: 193 lb 14.4 oz (88 kg)    Fetal Status: Fetal Heart Rate (bpm): 161   Movement: Present     General:  Alert, oriented and cooperative. Patient is in no acute distress.  Skin: Skin is warm and dry. No rash noted.   Cardiovascular: Normal heart rate noted  Respiratory: Normal respiratory effort, no problems with respiration noted  Abdomen: Soft, gravid, appropriate for gestational age.  Pain/Pressure: Present     Pelvic: Cervical exam deferred        Extremities: Normal range of motion.  Edema: None  Mental Status: Normal mood and affect. Normal behavior. Normal judgment and thought content.   Assessment and Plan:  Pregnancy: [id number] at [redacted]w[redacted]d  1. High risk pregnancy,  antepartum  - [place] MFM OB DETAIL +14 WK; Future - Urine Culture- hx of frequent UTI's  2. Hx of preeclampsia, prior pregnancy, currently pregnant  - Urine Culture - Continue BASA   3. Supervision of high risk pregnancy, antepartum  Doing well No complaints  BP good today. [place] scheduled with MFM  Integrated x 2 normal    Preterm labor symptoms and general obstetric precautions including but not limited to vaginal bleeding, contractions, leaking of fluid and fetal movement were reviewed in detail with the patient. Please refer to After Visit Summary for other counseling recommendations.   Return in about 4 weeks (around 10/29/2021) for MD only .  Future Appointments  Date Time Provider Department Center  10/08/2021  2:15 PM [facility] HEALTH CLINICIAN [facility]  10/29/2021  3:55 PM Warden Fillers, MD [facility]    Venia Carbon, NP

## 2021-10-07 LAB — URINE CULTURE

## 2021-10-08 ENCOUNTER — Encounter: Payer: Self-pay | Admitting: Obstetrics and Gynecology

## 2021-10-08 ENCOUNTER — Other Ambulatory Visit: Payer: Self-pay | Admitting: Obstetrics and Gynecology

## 2021-10-08 ENCOUNTER — Ambulatory Visit: Payer: Self-pay | Admitting: Clinical

## 2021-10-08 DIAGNOSIS — O2340 Unspecified infection of urinary tract in pregnancy, unspecified trimester: Secondary | ICD-10-CM | POA: Insufficient documentation

## 2021-10-08 DIAGNOSIS — F909 Attention-deficit hyperactivity disorder, unspecified type: Secondary | ICD-10-CM

## 2021-10-08 DIAGNOSIS — O2342 Unspecified infection of urinary tract in pregnancy, second trimester: Secondary | ICD-10-CM | POA: Insufficient documentation

## 2021-10-08 DIAGNOSIS — F4310 Post-traumatic stress disorder, unspecified: Secondary | ICD-10-CM

## 2021-10-08 MED ORDER — NITROFURANTOIN MONOHYD MACRO 100 MG PO CAPS: 100.0000 mg | ORAL_CAPSULE | Freq: Two times a day (BID) | ORAL | 0 refills | Status: AC

## 2021-10-08 MED ORDER — NITROFURANTOIN MONOHYD MACRO 100 MG PO CAPS: 100.0000 mg | ORAL_CAPSULE | Freq: Every day | ORAL | 2 refills | Status: AC

## 2021-10-08 NOTE — Progress Notes (Signed)
+   UTI, second UTI since September. Will start prophylaxis treatment.    Duane Lope, NP 10/08/2021 12:31 PM

## 2021-10-08 NOTE — Patient Instructions (Signed)
[facility] [address] , [place] [address] [phone] (main office) [phone] (Anjelique Makar's office)  [email or web address]       BRAINSTORMING  Develop a Plan Goals: Provide a way to start conversation about your new life with a baby Assist parents in recognizing and using resources within their reach Help pave the way before birth for an easier period of transition afterwards.  Make a list of the following information to keep in a central location: Full name of Mom and Partner: _____________________________________________ Baby's full name and Date of Birth: ___________________________________________ Home Address: ___________________________________________________________ ________________________________________________________________________ Home Phone: ____________________________________________________________ Parents' cell numbers: _____________________________________________________ ________________________________________________________________________ Name and contact info for OB: ______________________________________________ Name and contact info for Pediatrician:________________________________________ Contact info for Lactation Consultants: ________________________________________  REST and SLEEP *You each need at least 4-5 hours of uninterrupted sleep every day. Write specific names and contact information.* How are you going to rest in the postpartum period? While partner's home? When partner returns to work? When you both return to work? Where will your baby sleep? Who is available to help during the day? Evening? Night? Who could move in for a period to help support you? What are some ideas to help you get enough  sleep? __________________________________________________________________________________________________________________________________________________________________________________________________________________________________________ NUTRITIOUS FOOD AND DRINK *Plan for meals before your baby is born so you can have healthy food to eat during the immediate postpartum period.* Who will look after breakfast? Lunch? Dinner? List names and contact information. Brainstorm quick, healthy ideas for each meal. What can you do before baby is born to prepare meals for the postpartum period? How can others help you with meals? Which grocery stores provide online shopping and delivery? Which restaurants offer take-out or delivery options? ______________________________________________________________________________________________________________________________________________________________________________________________________________________________________________________________________________________________________________________________________________________________________________________________________  CARE FOR MOM *It's important that mom is cared for and pampered in the postpartum period. Remember, the most important ways new mothers need care are: sleep, nutrition, gentle exercise, and time off.* Who can come take care of mom during this period? Make a list of people with their contact information. List some activities that make you feel cared for, rested, and energized? Who can make sure you have opportunities to do these things? Does mom have a space of her very own within your home that's just for her? Make a "Mama Cave" where she can be comfortable, rest, and renew herself  daily. ______________________________________________________________________________________________________________________________________________________________________________________________________________________________________________________________________________________________________________________________________________________________________________________________________    CARE FOR AND FEEDING BABY *Knowledgeable and encouraging people will offer the best support with regard to feeding your baby.* Educate yourself and choose the best feeding option for your baby. Make a list of people who will guide, support, and be a resource for you as your care for and feed your baby. (Friends that have breastfed or are currently breastfeeding, lactation consultants, breastfeeding support groups, etc.) Consider a postpartum doula. (These websites can give you information: dona.org & padanc.org) Seek out local breastfeeding resources like the breastfeeding support group at [facility] or [employer]. ______________________________________________________________________________________________________________________________________________________________________________________________________________________________________________________________________________________________________________________________________________________________________________________________________  CHORES AND ERRANDS Who can help with a thorough cleaning before baby is born? Make a list of people who will help with housekeeping and chores, like laundry, light cleaning, dishes, bathrooms, etc. Who can run some errands for you? What can you do to make sure you are stocked with basic supplies before baby is born? Who is going to do the  shopping? ______________________________________________________________________________________________________________________________________________________________________________________________________________________________________________________________________________________________________________________________________________________________________________________________________     Family Adjustment *Nurture yourselves.it helps parents be more loving and allows for better bonding with their child.* What sorts of things do   doing together? Which activities help you to connect and strengthen your relationship? Make a list of those things. Make a list of people whom you trust to care for your baby so you can have some time together as a couple. What types of things help partner feel connected to Mom? Make a list. What needs will partner have in order to bond with baby? Other children? Who will care for them when you go into labor and while you are in the hospital? Think about what the needs of your older children might be. Who can help you meet those needs? In what ways are you helping them prepare for bringing baby home? List some specific strategies you have for family adjustment. _______________________________________________________________________________________________________________________________________________________________________________________________________________________________________________________________________________________________________________________________________________  SUPPORT *Someone who can empathize with experiences normalizes your problems and makes them more bearable.* Make a list of other friends, neighbors, and/or co-workers you know with infants (and small children, if applicable) with whom you can connect. Make a list of local or online support groups, mom groups, etc. in which you can be  involved. ______________________________________________________________________________________________________________________________________________________________________________________________________________________________________________________________________________________________________________________________________________________________________________________________________  Childcare Plans Investigate and plan for childcare if mom is returning to work. Talk about mom's concerns about her transition back to work. Talk about partner's concerns regarding this transition.  Mental Health *Your mental health is one of the highest priorities for a pregnant or postpartum mom.* 1 in 5 women experience anxiety and/or depression from the time of conception through the first year after birth. Postpartum Mood Disorders are the #1 complication of pregnancy and childbirth and the suffering experienced by these mothers is not necessary! These illnesses are temporary and respond well to treatment, which often includes self-care, social support, talk therapy, and medication when needed. Women experiencing anxiety and depression often say things like: "I'm supposed to be happy.why do I feel so sad?", "Why can't I snap out of it?", "I'm having thoughts that scare me." There is no need to be embarrassed if you are feeling these symptoms: Overwhelmed, anxious, angry, sad, guilty, irritable, hopeless, exhausted but can't sleep You are NOT alone. You are NOT to blame. With help, you WILL be well. Where can I find help? Medical professionals such as your OB, midwife, gynecologist, family practitioner, primary care provider, pediatrician, or mental health providers; [facility] support groups: Feelings After Birth, Breastfeeding Support Group, Baby and Me Group, and Fit 4 Two exercise classes. You have permission to ask for help. It will confirm your feelings, validate your experiences,  share/learn coping strategies, and gain support and encouragement as you heal. You are important! BRAINSTORM Make a list of local resources, including resources for mom and for partner. Identify support groups. Identify people to call late at night - include names and contact info. Talk with partner about perinatal mood and anxiety disorders. Talk with your OB, midwife, and doula about baby blues and about perinatal mood and anxiety disorders. Talk with your pediatrician about perinatal mood and anxiety disorders.   Support & Sanity Savers   What do you really need?  Basics In preparing for a new baby, many expectant parents spend hours shopping for baby clothes, decorating the nursery, and deciding which car seat to buy. Yet most don't think much about what the reality of parenting a newborn will be like, and what they need to make it through that. So, here is the advice of experienced parents. We know you'll read this, and think "they're exaggerating, I don't really need that." Just trust us on these, OK? Plan for all of   this, and if it turns out you don't need it, come back and teach us how you did it!  Must-Haves (Once baby's survival needs are met, make sure you attend to your own survival needs!) Sleep An average newborn sleeps 16-18 hours per day, over 6-7 sleep periods, rarely more than three hours at a time. It is normal and healthy for a newborn to wake throughout the night... but really hard on parents!! Naps. Prioritize sleep above any responsibilities like: cleaning house, visiting friends, running errands, etc.  Sleep whenever baby sleeps. If you can't nap, at least have restful times when baby eats. The more rest you get, the more patient you will be, the more emotionally stable, and better at solving problems.  Food You may not have realized it would be difficult to eat when you have a newborn. Yet, when we talk to countless new parents, they say things like "it may be 2:00 pm  when I realize I haven't had breakfast yet." Or "every time we sit down to dinner, baby needs to eat, and my food gets cold, so I don't bother to eat it." Finger food. Before your baby is born, stock up with one months' worth of food that: 1) you can eat with one hand while holding a baby, 2) doesn't need to be prepped, 3) is good hot or cold, 4) doesn't spoil when left out for a few hours, and 5) you like to eat. Think about: nuts, dried fruit, Clif bars, pretzels, jerky, gogurt, baby carrots, apples, bananas, crackers, cheez-n-crackers, string cheese, hot pockets or frozen burritos to microwave, garden burgers and breakfast pastries to put in the toaster, yogurt drinks, etc. Restaurant Menus. Make lists of your favorite restaurants & menu items. When family/friends want to help, you can give specific information without much thought. They can either bring you the food or send gift cards for just the right meals. Freezer Meals.  Take some time to make a few meals to put in the freezer ahead of time.  Easy to freeze meals can be anything such as soup, lasagna, chicken pie, or spaghetti sauce. Set up a Meal Schedule.  Ask friends and family to sign up to bring you meals during the first few weeks of being home. (It can be passed around at baby showers!) You have no idea how helpful this will be until you are in the throes of parenting.  www.takethemameal.com is a great website to check out. Emotional Support Know who to call when you're stressed out. Parenting a newborn is very challenging work. There are times when it totally overwhelms your normal coping abilities. EVERY NEW PARENT NEEDS TO HAVE A PLAN FOR WHO TO CALL WHEN THEY JUST CAN'T COPE ANY MORE. (And it has to be someone other than the baby's other parent!) Before your baby is born, come up with at least one person you can call for support - write their phone number down and post it on the refrigerator. Anxiety & Sadness. Baby blues are normal after  pregnancy; however, there are more severe types of anxiety & sadness which can occur and should not be ignored.  They are always treatable, but you have to take the first step by reaching out for help. [facility] offers a "Mom Talk" group which meets every Tuesday from 10 am - 11 am.  This group is for new moms who need support and connection after their babies are born.  Call [phone].  Really, Really Helpful (Plan for them!   Make sure these happen often!!) Physical Support with Taking Care of Yourselves Asking friends and family. Before your baby is born, set up a schedule of people who can come and visit and help out (or ask a friend to schedule for you). Any time someone says "let me know what I can do to help," sign them up for a day. When they get there, their job is not to take care of the baby (that's your job and your joy). Their job is to take care of you!  Postpartum doulas. If you don't have anyone you can call on for support, look into postpartum doulas:  professionals at helping parents with caring for baby, caring for themselves, getting breastfeeding started, and helping with household tasks. www.padanc.org is a helpful website for learning about doulas in our area. Peer Support / Parent Groups Why: One of the greatest ideas for new parents is to be around other new parents. Parent groups give you a chance to share and listen to others who are going through the same season of life, get a sense of what is normal infant development by watching several babies learn and grow, share your stories of triumph and struggles with empathetic ears, and forgive your own mistakes when you realize all parents are learning by trial and error. Where to find: There are many places you can meet other new parents throughout our community.  [facility] offers the following classes for new moms and their little ones:  Baby and Me (Birth to Crawling) and Breastfeeding Support Group. Go to  www.[email or web address] or call [phone] for more information. Time for your Relationship It's easy to get so caught up in meeting baby's immediate needs that it's hard to find time to connect with your partner, and meet the needs of your relationship. It's also easy to forget what "quality time with your partner" actually looks like. If you take your baby on a date, you'd be amazed how much of your couple time is spent feeding the baby, diapering the baby, admiring the baby, and talking about the baby. Dating: Try to take time for just the two of you. Babysitter tip: Sometimes when moms are breastfeeding a newborn, they find it hard to figure out how to schedule outings around baby's unpredictable feeding schedules. Have the babysitter come for a three hour period. When she comes over, if baby has just eaten, you can leave right away, and come back in two hours. If baby hasn't fed recently, you start the date at home. Once baby gets hungry and gets a good feeding in, you can head out for the rest of your date time. Date Nights at Home: If you can't get out, at least set aside one evening a week to prioritize your relationship: whenever baby dozes off or doesn't have any immediate needs, spend a little time focusing on each other. Potential conflicts: The main relationship conflicts that come up for new parents are: issues related to sexuality, financial stresses, a feeling of an unfair division of household tasks, and conflicts in parenting styles. The more you can work on these issues before baby arrives, the better!  Fun and Frills (Don't forget these. and don't feel guilty for indulging in them!) Everyone has something in life that is a fun little treat that they do just for themselves. It may be: reading the morning paper, or going for a daily jog, or having coffee with a friend once a week, or going to a movie on Friday nights,   or fine chocolates, or bubble baths, or curling up with a good  book. Unless you do fun things for yourself every now and then, it's hard to have the energy for fun with your baby. Whatever your "special" treats are, make sure you find a way to continue to indulge in them after your baby is born. These special moments can recharge you, and allow you to return to baby with a new joy   PERINATAL MOOD DISORDERS: MATERNAL MENTAL HEALTH FROM CONCEPTION THROUGH THE POSTPARTUM PERIOD   _________________________________________Emergency and Crisis Resources If you are an imminent risk to self or others, are experiencing intense personal distress, and/or have noticed significant changes in activities of daily living, call:  911 [facility]: [phone]  [address], [place], [place], [address] Mobile Crisis: 877-626-1772 National Suicide Hotline: 988 Or visit the following crisis centers: Local Emergency Departments [facility]: [address], [place]  [phone]. Hours: 8:30AM-5PM. Insurance Accepted: Medicaid, Medicare, and Uninsured.  [facility]:  [address], [place]  Mon-Friday 8am-3pm, [phone]                                                                                  ___________ Non-Crisis Resources To identify specific providers that are covered by your insurance, contact your insurance company or local agencies:  [facility]: 1-800-256-2452 [facility]: 888-581-9988 [facility]: 1-800-939-5911 Postpartum Support International- Warm-line: 1-800-944-4773                                                      __Outpatient Therapy and Medication Management   Providers:  [facility]: [phone] Hours: 9AM-5PM  Insurance Accepted: AARP, Aetna, BCBS, Cigna, [employer], Humana, Medicare  [facility] [phone] Hours: 8AM-5:30PM  nsurance Accepted: All insurances EXCEPT AARP, Aetna,  [employer], and Humana [facility]: [phone] Hours: 8AM-8PM Insurance Accepted: Aetna, BCBS, Cigna, [employer], Medicaid, Medicare, Uninsured [facility]: [phone] [facility]: [phone] Hours: 8:30AM-7PM Insurance Accepted: Aetna, BCBS, Medicaid, Medicare, Tricare, United Healthcare [facility]:  [phone]   Hours:9AM-5PM Insurance Accepted:  Aetna, BCBS, [facility], Medicaid, United Health Care  [facility]: [phone] Hours: 9AM-5:30PM Insurance Accepted: AARP, Aetna, BCBS, Cigna, and Medicaid, Medicare, United Health Care [facility]:  [phone] Hours: 9am-5pm Insurance Accepted: BCBS; they do not accept Medicaid/Medicare [facility]: [phone] Hours: 9am-9pm Insurance Accepted: All major insurance including Medicaid and Medicare [facility]: [phone] Hours: 9AM- 5PM Insurance Accepted: All insurances EXCEPT Medicaid and Medicare. [facility]: [phone]   ____________                                                                       Parenting Support Groups [facility]: [phone] [facility]:  [phone] Family Support Network: (support for children in the NICU and/or with special needs), [phone]   ___________                                                                 Mental Health Support Groups Mental Health Association: [phone]    _____________                                                                                  Online Resources Postpartum Support International: http://www.postpartum.net/  800-944-4PPD 2Moms Supporting Moms:  www.momssupportingmoms.net    

## 2021-10-09 NOTE — BH Specialist Note (Addendum)
Integrated Behavioral Health via Telemedicine Visit  10/09/2021 [name] [id number]  Number of Integrated Behavioral Health visits: 2 Session Start time: 9:16  Session End time: 9:55 Total time:  39  Referring Provider: Shawna Clamp, CNM Patient/Family location: Home [facility] Provider location: [facility]  All persons participating in visit: [name] and [facility] Duane Earnshaw   Types of Service: Individual psychotherapy and Telephone visit  I connected with [name] and/or Jacona D Johnson's  n/a  via  Telephone or Engineer, civil (consulting)  (Video is Surveyor, mining) and verified that I am speaking with the correct person using two identifiers. Discussed confidentiality: Yes   I discussed the limitations of telemedicine and the availability of in person appointments.  Discussed there is a possibility of technology failure and discussed alternative modes of communication if that failure occurs.  I discussed that engaging in this telemedicine visit, they consent to the provision of behavioral healthcare and the services will be billed under their insurance.  Patient and/or legal guardian expressed understanding and consented to Telemedicine visit: Yes   Presenting Concerns: Patient and/or family reports the following symptoms/concerns: Stress over custody issue of 24yo; pt goal remains to obtain full custody of 24yo and to bring new baby home from [facility] after upcoming childbirth Duration of problem: Ongoing; increase worry in current pregnancy; Severity of problem: mild  Patient and/or Family's Strengths/Protective Factors: Social connections, Concrete supports in place (healthy food, safe environments, etc.), and Sense of purpose  Goals Addressed: Patient will:  Reduce symptoms of: anxiety and stress   Increase knowledge and/or ability of: stress reduction   Demonstrate ability  to: Increase healthy adjustment to current life circumstances  Progress towards Goals: Ongoing  Interventions: Interventions utilized:  Solution-Focused Strategies Standardized Assessments completed: Not Needed  Patient and/or Family Response: pt agrees with2 treatment plan  Assessment: Patient currently experiencing History of PTSD; History of ADHD, previously diagnosed by psychiatry  Patient may benefit from continued psychoeducation and brief therapeutic interventions regarding coping with symptoms of anxiety and stress .  Plan: Follow up with behavioral health clinician on : Three weeks Behavioral recommendations:  -Continue taking prenatal vitamin daily as prescribed -Accept referral to [facility] for BH medication management and ongoing therapy for postpartum -Continue attending parenting classes, setting healthy boundaries with family members, and keeping a positive outlook -Continue keeping written record of attempts to see older child(ren), and outcomes of those attempts Referral(s): Integrated Art gallery manager (In Clinic) and [facility] (LME/Outside Clinic)  I discussed the assessment and treatment plan with the patient and/or parent/guardian. They were provided an opportunity to ask questions and all were answered. They agreed with the plan and demonstrated an understanding of the instructions.   They were advised to call back or seek an in-person evaluation if the symptoms worsen or if the condition fails to improve as anticipated.  Rae Lips, LCSW  Depression screen [facility] 2/9 10/01/2021 08/12/2021 07/19/2019 04/05/2018  Decreased Interest 0 0 0 0  Down, Depressed, Hopeless 0 0 0 0  PHQ - 2 Score 0 0 0 0  Altered sleeping 1 1 0 1  Tired, decreased energy 0 1 0 1  Change in appetite 0 0 0 0  Feeling bad or failure about yourself  0 0 0 0  Trouble concentrating 0 0 0 0  Moving slowly or fidgety/restless 0 0 0 0  Suicidal thoughts 0 0 0 0  PHQ-9 Score 1 2 0 2  Difficult doing work/chores - - - Not difficult at all   GAD 7 : Generalized Anxiety Score 10/01/2021 08/12/2021  Nervous, Anxious, on Edge 0 0  Control/stop worrying 0 0  Worry too much - different things 0 0  Trouble relaxing 0 0  Restless 0 0  Easily annoyed or irritable 0 1  Afraid - awful might happen 0 0  Total GAD 7 Score 0 1

## 2021-10-23 ENCOUNTER — Ambulatory Visit (INDEPENDENT_AMBULATORY_CARE_PROVIDER_SITE_OTHER): Payer: Medicaid Other | Admitting: Clinical

## 2021-10-23 DIAGNOSIS — F4310 Post-traumatic stress disorder, unspecified: Secondary | ICD-10-CM

## 2021-10-23 DIAGNOSIS — F319 Bipolar disorder, unspecified: Secondary | ICD-10-CM | POA: Diagnosis not present

## 2021-10-23 DIAGNOSIS — F909 Attention-deficit hyperactivity disorder, unspecified type: Secondary | ICD-10-CM

## 2021-10-27 ENCOUNTER — Encounter (HOSPITAL_COMMUNITY): Payer: Self-pay | Admitting: Family Medicine

## 2021-10-27 ENCOUNTER — Inpatient Hospital Stay (HOSPITAL_COMMUNITY): Payer: Medicaid Other

## 2021-10-27 ENCOUNTER — Other Ambulatory Visit: Payer: Self-pay

## 2021-10-27 ENCOUNTER — Inpatient Hospital Stay (HOSPITAL_COMMUNITY)
Admission: AD | Admit: 2021-10-27 | Discharge: 2021-10-27 | Disposition: A | Discharge status: Home / Self Care | Payer: Medicaid Other | Attending: Obstetrics and Gynecology | Admitting: Obstetrics and Gynecology

## 2021-10-27 DIAGNOSIS — M545 Low back pain, unspecified: Secondary | ICD-10-CM

## 2021-10-27 DIAGNOSIS — Z79899 Other long term (current) drug therapy: Secondary | ICD-10-CM | POA: Diagnosis not present

## 2021-10-27 DIAGNOSIS — Z3A23 23 weeks gestation of pregnancy: Secondary | ICD-10-CM | POA: Diagnosis not present

## 2021-10-27 DIAGNOSIS — O26892 Other specified pregnancy related conditions, second trimester: Secondary | ICD-10-CM | POA: Insufficient documentation

## 2021-10-27 DIAGNOSIS — M5459 Other low back pain: Secondary | ICD-10-CM | POA: Diagnosis present

## 2021-10-27 DIAGNOSIS — Z760 Encounter for issue of repeat prescription: Secondary | ICD-10-CM | POA: Diagnosis not present

## 2021-10-27 DIAGNOSIS — O2342 Unspecified infection of urinary tract in pregnancy, second trimester: Secondary | ICD-10-CM | POA: Insufficient documentation

## 2021-10-27 DIAGNOSIS — N390 Urinary tract infection, site not specified: Secondary | ICD-10-CM | POA: Diagnosis not present

## 2021-10-27 DIAGNOSIS — O368120 Decreased fetal movements, second trimester, not applicable or unspecified: Secondary | ICD-10-CM | POA: Diagnosis present

## 2021-10-27 DIAGNOSIS — R102 Pelvic and perineal pain: Secondary | ICD-10-CM | POA: Diagnosis not present

## 2021-10-27 DIAGNOSIS — Z87442 Personal history of urinary calculi: Secondary | ICD-10-CM | POA: Insufficient documentation

## 2021-10-27 LAB — URINALYSIS, MICROSCOPIC (REFLEX)

## 2021-10-27 LAB — URINALYSIS, ROUTINE W REFLEX MICROSCOPIC
Bilirubin Urine: NEGATIVE
Glucose, UA: NEGATIVE mg/dL
Hgb urine dipstick: NEGATIVE
Ketones, ur: NEGATIVE mg/dL
Nitrite: NEGATIVE
Protein, ur: NEGATIVE mg/dL
Specific Gravity, Urine: 1.02 (ref 1.005–1.030)
pH: 6.5 (ref 5.0–8.0)

## 2021-10-27 IMAGING — US US RENAL
1 series · 16 of 25 positions shown · non-contrast
Comparison: None.

CLINICAL DATA: Right flank pain.

EXAM:
RENAL / URINARY TRACT ULTRASOUND COMPLETE

[Series 1: us renal · 31 acquisitions, 16 frames shown]
[im 1/31]
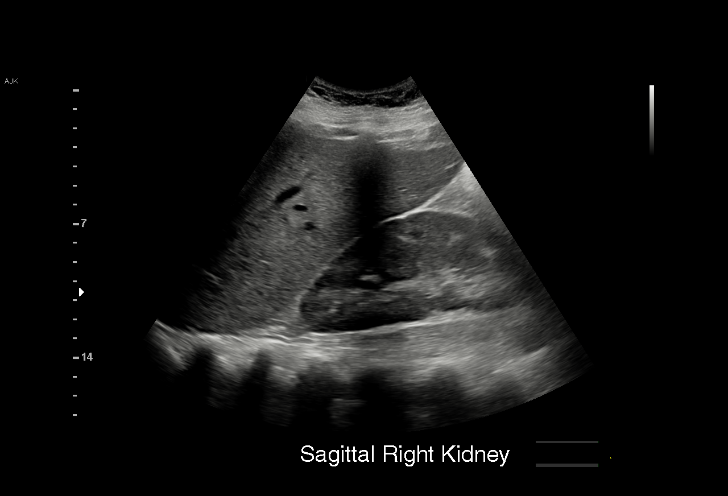
[im 3/31]
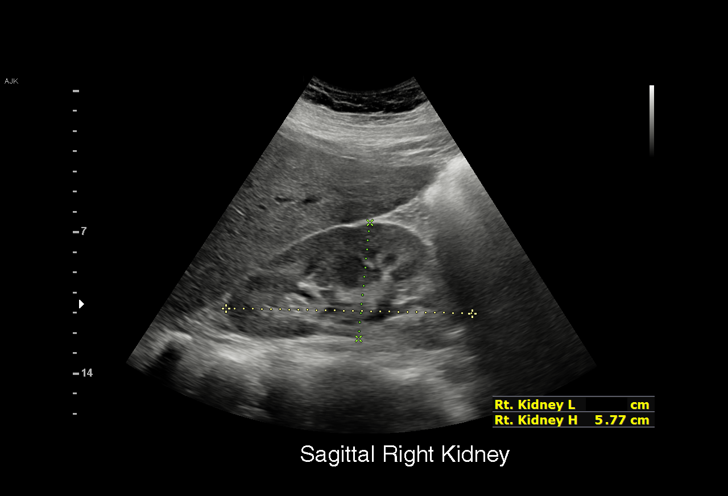
[im 4/31]
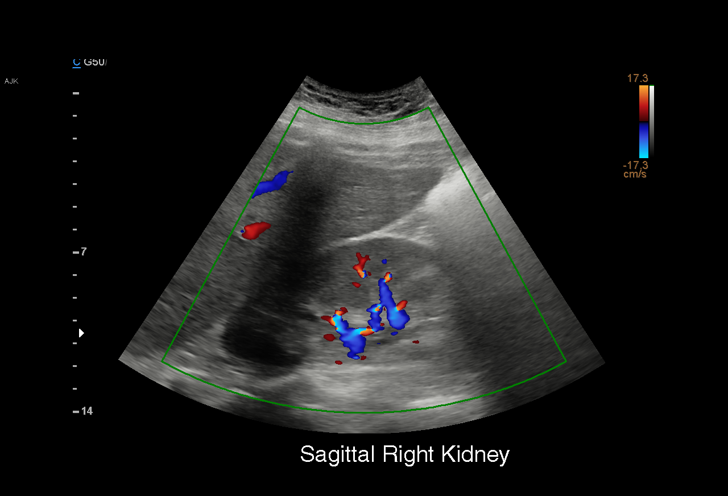
[im 7/31]
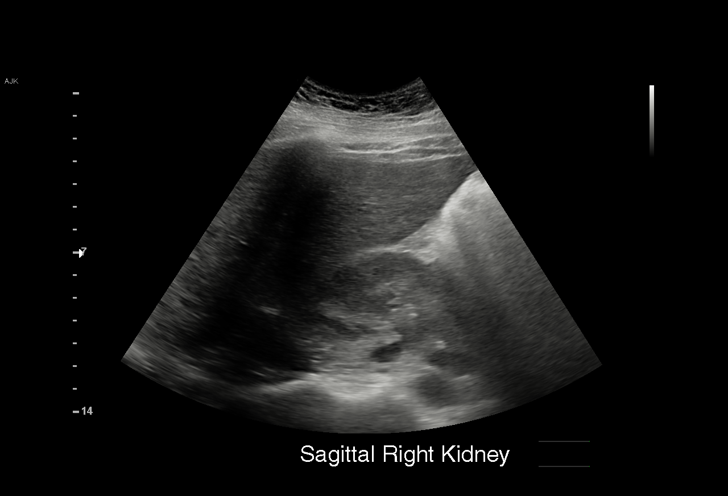
[im 9/31]
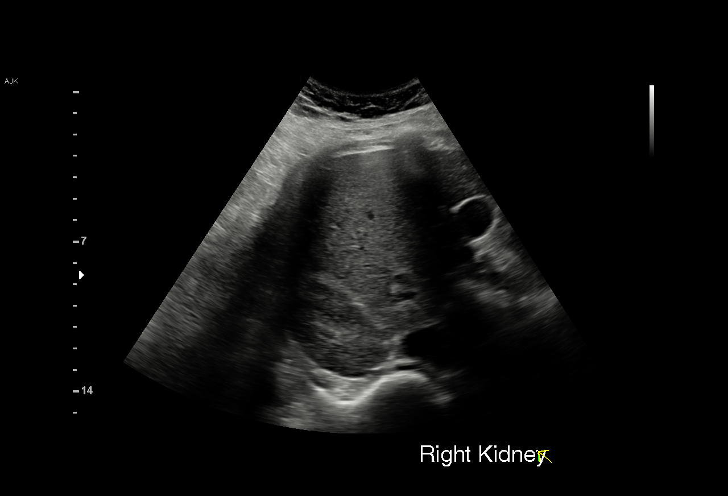
[im 11/31]
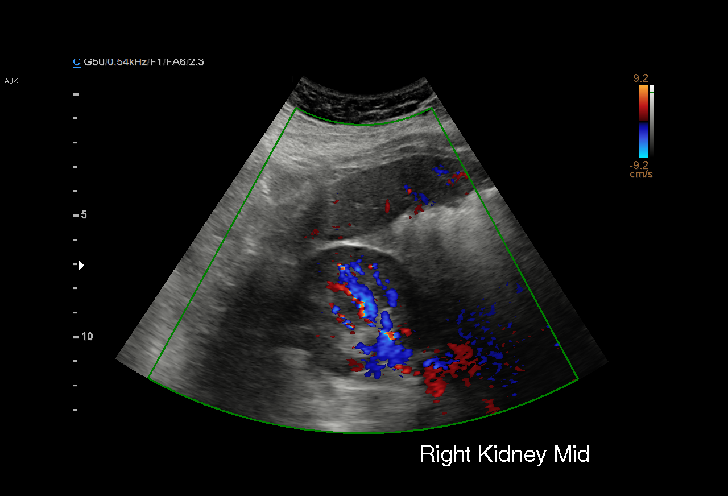
[im 13/31]
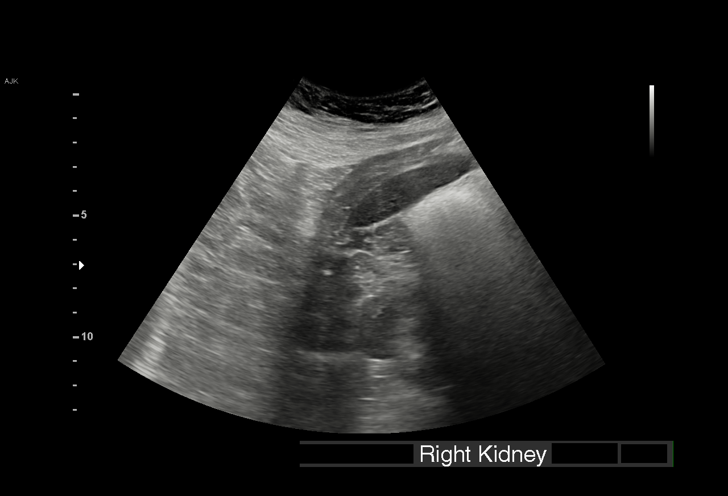
[im 14/31]
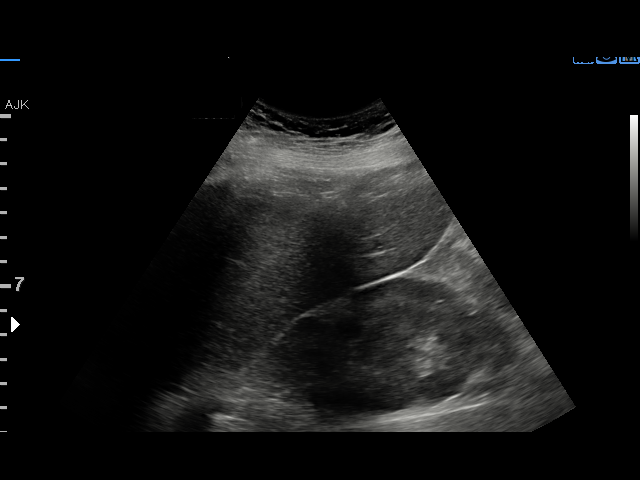
[im 17/31]
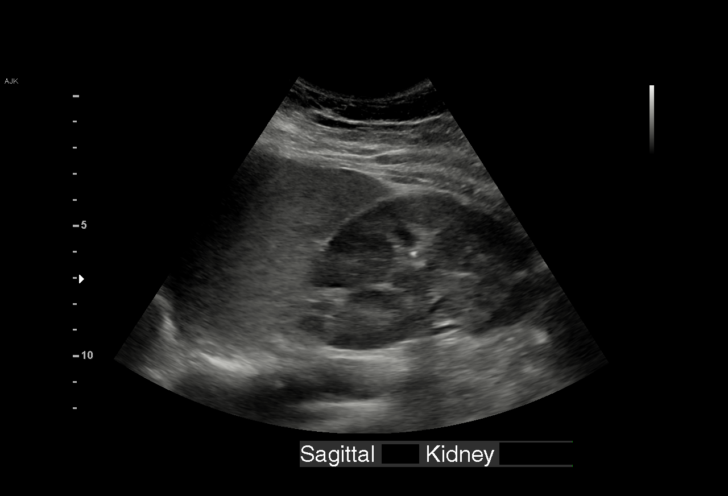
[im 18/31]
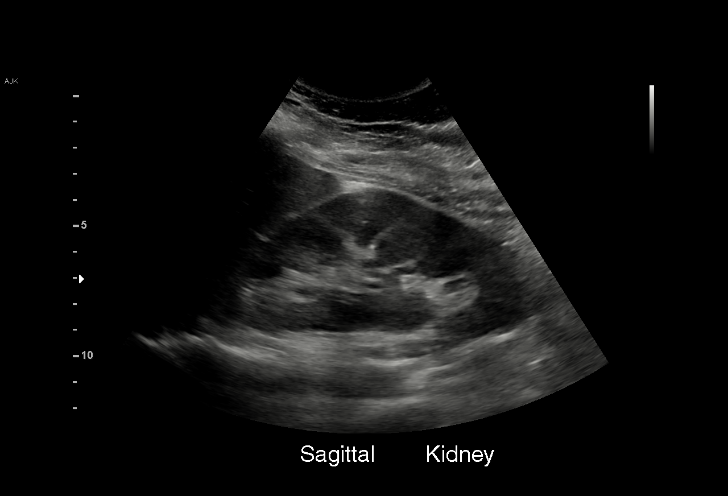
[im 21/31]
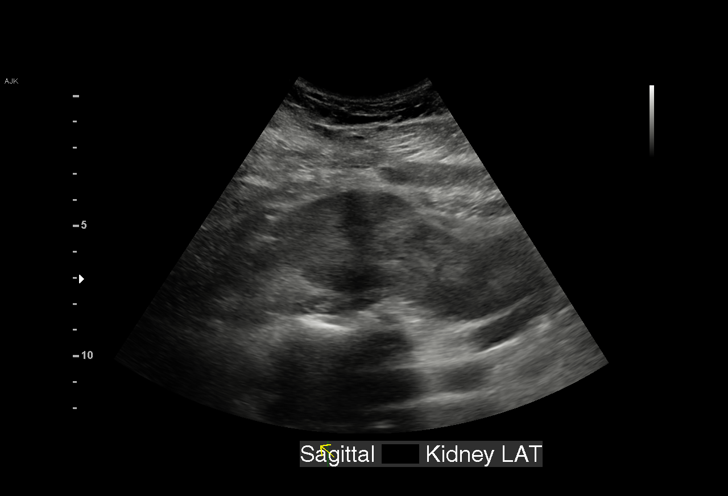
[im 22/31]
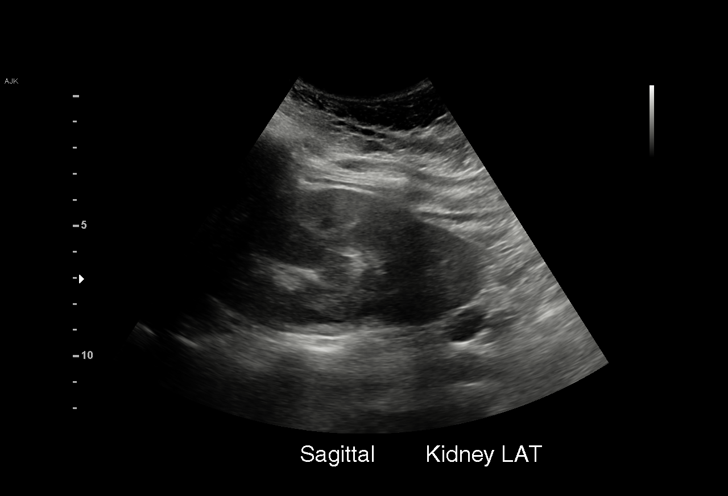
[im 24/31]
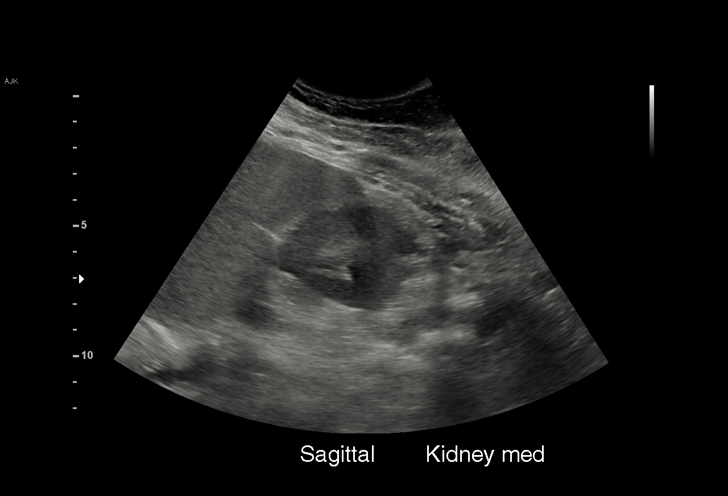
[im 27/31]
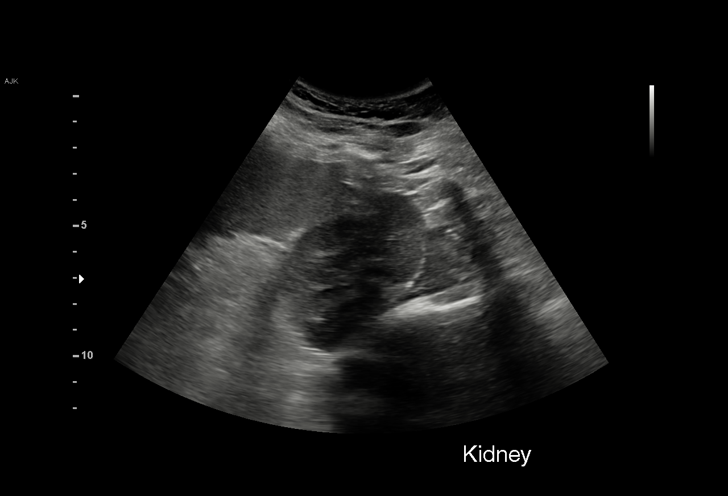
[im 28/31]
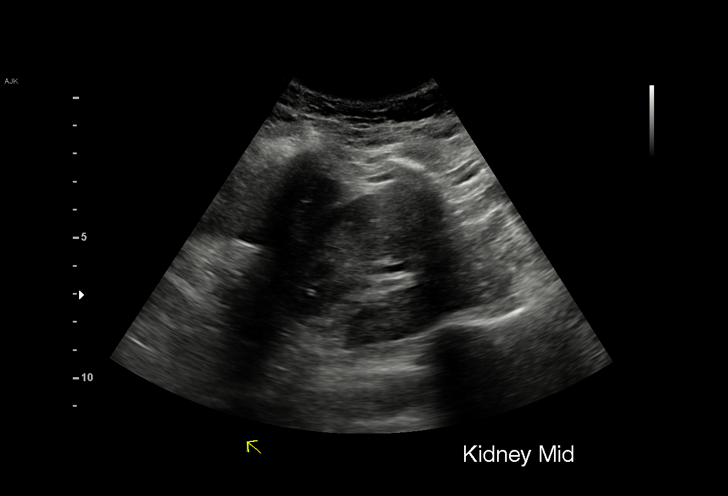
[im 31/31]
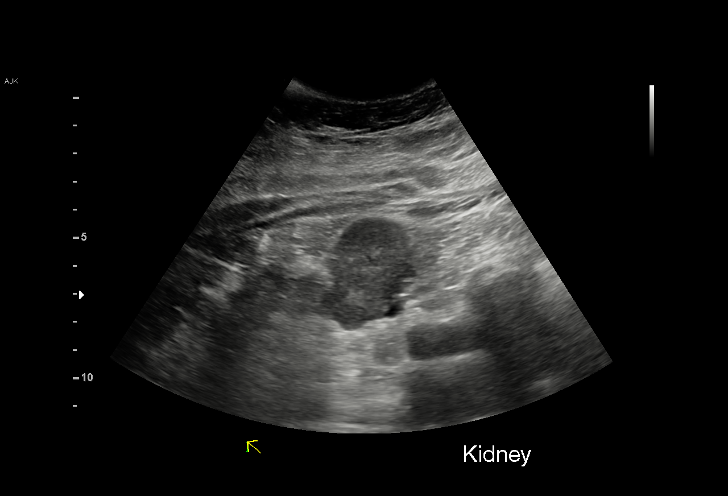

[16 of 25 positions shown; findings below may reference images not displayed]

FINDINGS: Right Kidney:

Renal measurements: 12.2 x 5.8 x 5.8 cm = volume: 213 mL.
Echogenicity within normal limits. No mass or hydronephrosis
visualized.

Left Kidney:

Renal measurements: 11.5 x 5.6 x 6.0 cm = volume: 201 mL.
Echogenicity within normal limits. No mass or hydronephrosis
visualized.

Bladder:

Appears normal for degree of bladder distention.

Other:

None.
IMPRESSION: Unremarkable renal ultrasound.

## 2021-10-27 MED ORDER — ACETAMINOPHEN 325 MG PO TABS: 650.0000 mg | ORAL_TABLET | Freq: Once | ORAL | Status: DC

## 2021-10-27 MED ORDER — HYDROXYZINE HCL 25 MG PO TABS: 25.0000 mg | ORAL_TABLET | Freq: Every evening | ORAL | 0 refills | Status: DC

## 2021-10-27 MED ORDER — LEVETIRACETAM 1000 MG PO TABS: 1000.0000 mg | ORAL_TABLET | Freq: Two times a day (BID) | ORAL | 0 refills | Status: DC

## 2021-10-27 MED ORDER — CYCLOBENZAPRINE HCL 5 MG PO TABS
10.0000 mg | ORAL_TABLET | Freq: Once | ORAL | Status: AC
  Administered 2021-10-27: 10 mg via ORAL
  Filled 2021-10-27: qty 2

## 2021-10-27 MED ORDER — CYCLOBENZAPRINE HCL 10 MG PO TABS: 10.0000 mg | ORAL_TABLET | Freq: Three times a day (TID) | ORAL | 0 refills | Status: DC

## 2021-10-27 NOTE — MAU Note (Addendum)
Pt in wc, tearful. About 1200 last night, has been having severe lower back and lower abd pain.  Also her movements have greatly decreased.  Called OB and was told to come in .  No bleeding or leaking.  Hx of PTL and 1 preterm delivery (induced because of severe pre E).  Denies urinary problems, constipation or diarrhea.

## 2021-10-27 NOTE — MAU Provider Note (Signed)
Patient DOROTHYE BERNI is a 25 y.o. [phone]  At [redacted]w[redacted]d here with complaints of back pain that started last night, as well as ongoing pelvic pressure. Partner is at the bedside stating that "patient cannot sleep at night". Patient reports distress that she is unable to sleep.   She denies vaginal bleeding, LOF, dysuria, contractions, HA, blurry vision, floating spots, SOB, chills, muscle aches.   Per patient, she is on macrobid prophylactically because of her constant UTIs in pregnancy. She has a history of kidney stones.   She reports a fever last night of 101. She doesn't have a fever right now.   She reports decreased fetal movements; she usually feels baby move around 5/hour and today her movements are decreased.  History     CSN: [id number]  Arrival date and time: 10/27/21 1801   None     Chief Complaint  Patient presents with   Abdominal Pain   Back Pain   Decreased Fetal Movement   Abdominal Pain This is a new problem. The current episode started yesterday. The pain is located in the suprapubic region. The quality of the pain is cramping. Associated symptoms include a fever. Pertinent negatives include no dysuria.  Back Pain This is a new problem. The current episode started yesterday. Pain scale: 5 when sitting and 8-9 when moving around. Associated symptoms include abdominal pain, a fever and pelvic pain. Pertinent negatives include no dysuria.   OB History     Gravida  7   Para  4   Term  3   Preterm  1   AB  2   Living  3      SAB  2   IAB      Ectopic      Multiple  0   Live Births  3           Past Medical History:  Diagnosis Date   ADHD (attention deficit hyperactivity disorder)    Allergy    Anxiety    Asthma    Eating disorder    Headache(784.0)    Kidney stone    kidney stones   Migraines    ODD (oppositional defiant disorder)    PID (acute pelvic inflammatory disease) 01/29/2016   PTSD (post-traumatic stress disorder)     Seizures (HCC)    diagnosed age 39    Past Surgical History:  Procedure Laterality Date   right knee surgery     TONSILLECTOMY AND ADENOIDECTOMY      Family History  Problem Relation Age of Onset   Diabetes Mother    Diabetes Sister    Breast cancer Maternal Grandmother    Diabetes Maternal Grandmother    Glaucoma Maternal Grandmother    Other Son        born with hole in heart   Other Daughter        born with hole in heart    Social History   Tobacco Use   Smoking status: Former    Packs/day: 0.50    Types: Cigarettes, E-cigarettes   Smokeless tobacco: Never   Tobacco comments:    nicotine patch  Vaping Use   Vaping Use: Former   Substances: Flavoring  Substance Use Topics   Alcohol use: Not Currently   Drug use: Not Currently    Types: Marijuana    Comment: Not since 02/13    Allergies:  Allergies  Allergen Reactions   Bee Venom Anaphylaxis   Penicillins Anaphylaxis    Has  patient had a PCN reaction causing immediate rash, facial/tongue/throat swelling, SOB or lightheadedness with hypotension: Unknown Has patient had a PCN reaction causing severe rash involving mucus membranes or skin necrosis: Unknown Has patient had a PCN reaction that required hospitalization: Unknown Has patient had a PCN reaction occurring within the last 10 years: Unknown If all of the above answers are "NO", then may proceed with Cephalosporin use.    Ativan [Lorazepam]     Aggressive-agitation   Prednisone Other (See Comments)    Bleeding nose bleeding and internal bleeding   Cocoa Butter Rash   Latex Rash    Medications Prior to Admission  Medication Sig Dispense Refill Last Dose   aspirin 81 MG EC tablet Take 2 tablets (162 mg total) by mouth daily. Swallow whole. 180 tablet 2 10/27/2021 at 0900   nitrofurantoin, macrocrystal-monohydrate, (MACROBID) 100 MG capsule Take 1 capsule (100 mg total) by mouth at bedtime for 90 doses. 30 capsule 2 10/26/2021 at 2230   Prenatal Vit-Fe  Fumarate-FA (PRENATAL VITAMIN PO) Take by mouth.   10/27/2021 at 0900   Blood Pressure Monitor MISC For regular home bp monitoring during pregnancy 1 each 0    EPINEPHrine 0.3 mg/0.3 mL IJ SOAJ injection Inject 0.3 mg into the muscle as needed for anaphylaxis. (Patient not taking: Reported on 08/12/2021) 2 each 0    ondansetron (ZOFRAN ODT) 4 MG disintegrating tablet Take 1 tablet (4 mg total) by mouth every 8 (eight) hours as needed. (Patient not taking: No sig reported) 20 tablet 0    phenazopyridine (PYRIDIUM) 200 MG tablet Take 1 tablet (200 mg total) by mouth 3 (three) times daily. (Patient not taking: No sig reported) 6 tablet 0    traZODone (DESYREL) 100 MG tablet Take 100 mg by mouth at bedtime.      [DISCONTINUED] levETIRAcetam (KEPPRA) 1000 MG tablet Take 1 tablet (1,000 mg total) by mouth 2 (two) times daily. 60 tablet 0     Review of Systems  Constitutional:  Positive for fever.  Gastrointestinal:  Positive for abdominal pain.  Genitourinary:  Positive for pelvic pain. Negative for dysuria.  Musculoskeletal:  Positive for back pain.  Neurological: Negative.   Physical Exam   Blood pressure (!) 130/91, pulse 99, temperature 98.3 F (36.8 C), temperature source Oral, resp. rate 20, SpO2 100 %, unknown if currently breastfeeding.  Physical Exam Constitutional:      Appearance: She is well-developed.  Abdominal:     General: Abdomen is flat. Bowel sounds are normal.     Palpations: Abdomen is soft.     Tenderness: There is no abdominal tenderness.  Genitourinary:    Vagina: No tenderness or bleeding.     Cervix: Normal.  Neurological:     Mental Status: She is alert.    MAU Course  Procedures  MDM -140 bpm, mod var, present acel, no decels, no contractions -Renal US is benign; no stones -cervix is closed, long, posterior -UA is negative for signs of infection.  -Flexeril offered before discharge  -Patient feels good fetal movements in MAU - Assessment and Plan    1. Acute right-sided low back pain without sciatica   -recommend pregnancy support belt -discussed exercises for pregnancy; could consider referral to PT if necessary -patient requesting refill on her Keppra; Refill given and RX for vistaril and flexeril  given -keep appt on Wednesdy at [facility] -reviewed normal movements at 23 weeks -all questions answered; patient stable for discharge.    Charlesetta Garibaldi Haward Pope 10/27/2021, 8:55  PM  

## 2021-10-29 ENCOUNTER — Encounter: Payer: Medicaid Other | Admitting: Obstetrics and Gynecology

## 2021-10-31 ENCOUNTER — Encounter: Payer: Self-pay | Admitting: *Deleted

## 2021-10-31 NOTE — BH Specialist Note (Signed)
Integrated Behavioral Health via Telemedicine Visit  10/31/2021 Erin [facility] [id number]  Number of Integrated Behavioral Health visits: 3 Session Start time: 10:52  Session End time: 12:03 Total time:  16  Referring Provider: Shawna [facility], CNM Patient/Family location: Home [facility] Provider [facility]: [facility]  All persons participating in visit: [name] and Erin [facility]   Types of Service: Family psychotherapy and Video visit  I connected with Erin Krueger and/or Erin Krueger's  n/a  via  Telephone or [employer]  (Video is Surveyor, mining) and verified that I am speaking with the correct person using two identifiers. Discussed confidentiality: Yes   I discussed the limitations of telemedicine and the availability of in person appointments.  Discussed there is a possibility of technology failure and discussed alternative modes of communication if that failure occurs.  I discussed that engaging in this telemedicine visit, they consent to the provision of behavioral healthcare and the services will be billed under their insurance.  Patient and/or legal guardian expressed understanding and consented to Telemedicine visit: Yes   Presenting Concerns: Patient and/or family reports the following symptoms/concerns: Ongoing stress over custody of children; pt experiencing anxiety as she gets closer to birth, fearing she will have new baby taken by DSS; feels children were taken by DSS for unfounded reasons. Pt finished parenting class, scheduled to take Lamaze classes with FOB, he will take Dad [employer] class, pt waiting to hear back from [facility] for initial psychiatry appointment (though she has seen psychiatry previously without need for [facility] medication), she and FOB have their own home and car, baby supplies ready for baby, and supportive friends/family. Pt's primary stress  stems from being separated from her children; sees the children weekly, in-person and face time.  Duration of problem: Increase current pregnancy; Severity of problem: moderate  Patient and/or Family's Strengths/Protective Factors: Concrete supports in place (healthy food, safe environments, etc.), Sense of purpose, Physical Health (exercise, healthy diet, medication compliance, etc.), and Caregiver has knowledge of parenting & child development  Goals Addressed: Patient will:  Reduce symptoms of: anxiety and stress   Increase knowledge and/or ability of: stress reduction   Demonstrate ability to: Increase healthy adjustment to current life circumstances  Progress towards Goals: Ongoing  Interventions: Interventions utilized:  Solution-Focused Strategies and Supportive Reflection Standardized Assessments completed:  PHQ9/GAD7 given in past two weeks  Patient and/or Family Response: Pt agrees with treatment plan  Assessment: Patient currently experiencing History of PTSD, History of ADHD,previously diagnosed by psychiatry; (Do not see evidence in person or in previous psychiatric assessment notes consistent with bipolar disorder)  Patient may benefit from continued psychoeducation and brief therapeutic interventions regarding coping with symptoms of anxiety and current stress .  Plan: Follow up with behavioral health clinician on : Three weeks Behavioral recommendations:  -Continue attending scheduled classes in preparation for baby's arrival -Continue plan to attend initial psychiatry appointment, when it is scheduled, at [facility], for ongoing therapy and assessment (and BH med management, if advised by psychiatry) -Continue taking prenatal vitamin daily -Continue setting healthy boundaries with family, keeping a positive outlook, and keeping written record of attempts to see children, and outcomes of the attempts -Read Postpartum Planner on AVS to help plan for postpartum time with  baby  Referral(s): Integrated Art gallery manager (In Clinic) and [facility] (LME/Outside Clinic)  I discussed the assessment and treatment plan with the patient and/or parent/guardian.  They were provided an opportunity to ask questions and all were answered. They agreed with the plan and demonstrated an understanding of the instructions.   They were advised to call back or seek an in-person evaluation if the symptoms worsen or if the condition fails to improve as anticipated.  Erin Lips, LCSW  Depression screen [facility] 2/9 11/03/2021 10/01/2021 08/12/2021 07/19/2019 04/05/2018  Decreased Interest 0 0 0 0 0  Down, Depressed, Hopeless 0 0 0 0 0  PHQ - 2 Score 0 0 0 0 0  Altered sleeping 0 1 1 0 1  Tired, decreased energy 0 0 1 0 1  Change in appetite 0 0 0 0 0  Feeling bad or failure about yourself  0 0 0 0 0  Trouble concentrating 0 0 0 0 0  Moving slowly or fidgety/restless 0 0 0 0 0  Suicidal thoughts 0 0 0 0 0  PHQ-9 Score 0 1 2 0 2  Difficult doing work/chores - - - - Not difficult at all   GAD 7 : Generalized Anxiety Score 11/03/2021 10/01/2021 08/12/2021  Nervous, Anxious, on Edge 0 0 0  Control/stop worrying 0 0 0  Worry too much - different things 0 0 0  Trouble relaxing 0 0 0  Restless 0 0 0  Easily annoyed or irritable 0 0 1  Afraid - awful might happen 0 0 0  Total GAD 7 Score 0 0 1

## 2021-11-03 ENCOUNTER — Encounter: Payer: Self-pay | Admitting: Women's Health

## 2021-11-03 ENCOUNTER — Ambulatory Visit (INDEPENDENT_AMBULATORY_CARE_PROVIDER_SITE_OTHER): Payer: Medicaid Other | Admitting: Obstetrics and Gynecology

## 2021-11-03 ENCOUNTER — Other Ambulatory Visit: Payer: Self-pay | Admitting: *Deleted

## 2021-11-03 ENCOUNTER — Other Ambulatory Visit: Payer: Self-pay

## 2021-11-03 ENCOUNTER — Ambulatory Visit: Payer: Medicaid Other | Attending: Obstetrics and Gynecology

## 2021-11-03 ENCOUNTER — Ambulatory Visit: Payer: Medicaid Other | Admitting: *Deleted

## 2021-11-03 ENCOUNTER — Encounter: Payer: Self-pay | Admitting: *Deleted

## 2021-11-03 VITALS — BP 144/86 | HR 101

## 2021-11-03 VITALS — BP 122/62 | HR 98 | Wt 206.4 lb

## 2021-11-03 DIAGNOSIS — Z8759 Personal history of other complications of pregnancy, childbirth and the puerperium: Secondary | ICD-10-CM

## 2021-11-03 DIAGNOSIS — O09292 Supervision of pregnancy with other poor reproductive or obstetric history, second trimester: Secondary | ICD-10-CM | POA: Diagnosis present

## 2021-11-03 DIAGNOSIS — F17200 Nicotine dependence, unspecified, uncomplicated: Secondary | ICD-10-CM

## 2021-11-03 DIAGNOSIS — F319 Bipolar disorder, unspecified: Secondary | ICD-10-CM

## 2021-11-03 DIAGNOSIS — O0990 Supervision of high risk pregnancy, unspecified, unspecified trimester: Secondary | ICD-10-CM | POA: Insufficient documentation

## 2021-11-03 DIAGNOSIS — O09299 Supervision of pregnancy with other poor reproductive or obstetric history, unspecified trimester: Secondary | ICD-10-CM

## 2021-11-03 DIAGNOSIS — Z8279 Family history of other congenital malformations, deformations and chromosomal abnormalities: Secondary | ICD-10-CM

## 2021-11-03 DIAGNOSIS — O2342 Unspecified infection of urinary tract in pregnancy, second trimester: Secondary | ICD-10-CM

## 2021-11-03 NOTE — Progress Notes (Signed)
Pt c/o headache since yesterday, and pressure in her upper abd

## 2021-11-03 NOTE — Progress Notes (Signed)
   PRENATAL VISIT NOTE  Subjective:  Erin [facility]ueger is a 25 y.o. [phone] at [redacted]w[redacted]d being seen today for ongoing prenatal care.  She is currently monitored for the following issues for this low-risk pregnancy and has PTSD (post-traumatic stress disorder); ADHD (attention deficit hyperactivity disorder), combined type; Polysubstance abuse (HCC); Conduct disorder, adolescent-onset type; Seizure (HCC); Pseudoseizures (HCC); Smoker; Family history of congenital heart defect; Asthma; Bipolar 1 disorder (HCC); Adopted person; Hx of preeclampsia, prior pregnancy, currently pregnant; Supervision of high risk pregnancy, antepartum; and Recurrent UTI (urinary tract infection) complicating pregnancy on their problem list.  Patient reports no complaints.  Contractions: Not present. Vag. Bleeding: None.  Movement: Absent. Denies leaking of fluid.   The following portions of the patient's history were reviewed and updated as appropriate: allergies, current medications, past family history, past medical history, past social history, past surgical history and problem list.   Objective:   Vitals:   11/03/21 0927  BP: 122/62  Pulse: 98  Weight: 206 lb 6.4 oz (93.6 kg)    Fetal Status: Fetal Heart Rate (bpm): 158 Fundal Height: 23 cm Movement: Absent     General:  Alert, oriented and cooperative. Patient is in no acute distress.  Skin: Skin is warm and dry. No rash noted.   Cardiovascular: Normal heart rate noted  Respiratory: Normal respiratory effort, no problems with respiration noted  Abdomen: Soft, gravid, appropriate for gestational age.  Pain/Pressure: Present     Pelvic: Cervical exam deferred        Extremities: Normal range of motion.  Edema: Trace  Mental Status: Normal mood and affect. Normal behavior. Normal judgment and thought content.   Assessment and Plan:  Pregnancy: [id number] at [redacted]w[redacted]d 1. Family history of congenital heart defect - Normal fetal echo  2. Smoker  3. Hx of  preeclampsia, prior pregnancy, currently pregnant - BP normal today, continue ldASA  4. Supervision of high risk pregnancy, antepartum - Discussed birth control for after - she has conceive on Nexplanon and POP. She did Depo in the past but had weight gain that was substantial. She is considering IUD but nervous about things she has read. We discussed the risks/benefits. She will consider. Information given.  - She is feeling better after MAU - she has ordered belly band and flexeril helps.  - Anatomy scan not yet done. They have added her on today.  - We discussed fasting labs for next time.   5. Bipolar 1 disorder (HCC) - Follows with behavioral  6. UTI in pregnancy - Proteus mirabilis - will do TOC. She is on suppression with macrobid.   Preterm labor symptoms and general obstetric precautions including but not limited to vaginal bleeding, contractions, leaking of fluid and fetal movement were reviewed in detail with the patient. Please refer to After Visit Summary for other counseling recommendations.   Return in about 4 weeks (around 12/01/2021) for OB VISIT, MD or APP, 2 hr GTT, Schedule [place] with MFM.  Future Appointments  Date Time Provider Department Center  11/03/2021  1:15 PM [facility] NURSE [facility]  11/03/2021  1:30 PM [facility] US3 [facility]  11/13/2021 10:45 AM [facility]  12/01/2021  8:15 AM Liberty Bing, MD [facility]  12/01/2021  8:50 AM [facility] LAB [facility]    Milas Hock, MD

## 2021-11-13 ENCOUNTER — Ambulatory Visit (INDEPENDENT_AMBULATORY_CARE_PROVIDER_SITE_OTHER): Payer: Medicaid Other | Admitting: Clinical

## 2021-11-13 DIAGNOSIS — F909 Attention-deficit hyperactivity disorder, unspecified type: Secondary | ICD-10-CM

## 2021-11-13 DIAGNOSIS — F4310 Post-traumatic stress disorder, unspecified: Secondary | ICD-10-CM

## 2021-11-13 NOTE — Patient Instructions (Signed)
[facility] [address] [place], [place] [address] [phone] (main office) [phone] (Devonta Blanford's office)     BRAINSTORMING  Develop a Plan Goals: Provide a way to start conversation about your new life with a baby Assist parents in recognizing and using resources within their reach Help pave the way before birth for an easier period of transition afterwards.  Make a list of the following information to keep in a central location: Full name of Mom and Partner: _____________________________________________ Baby's full name and Date of Birth: ___________________________________________ Home Address: ___________________________________________________________ ________________________________________________________________________ Home Phone: ____________________________________________________________ Parents' cell numbers: _____________________________________________________ ________________________________________________________________________ Name and contact info for OB: ______________________________________________ Name and contact info for Pediatrician:________________________________________ Contact info for Lactation Consultants: ________________________________________  REST and SLEEP *You each need at least 4-5 hours of uninterrupted sleep every day. Write specific names and contact information.* How are you going to rest in the postpartum period? While partner's home? When partner returns to work? When you both return to work? Where will your baby sleep? Who is available to help during the day? Evening? Night? Who could move in for a period to help support you? What are some ideas to help you get enough  sleep? __________________________________________________________________________________________________________________________________________________________________________________________________________________________________________ NUTRITIOUS FOOD AND DRINK *Plan for meals before your baby is born so you can have healthy food to eat during the immediate postpartum period.* Who will look after breakfast? Lunch? Dinner? List names and contact information. Brainstorm quick, healthy ideas for each meal. What can you do before baby is born to prepare meals for the postpartum period? How can others help you with meals? Which grocery stores provide online shopping and delivery? Which restaurants offer take-out or delivery options? ______________________________________________________________________________________________________________________________________________________________________________________________________________________________________________________________________________________________________________________________________________________________________________________________________  CARE FOR MOM *It's important that mom is cared for and pampered in the postpartum period. Remember, the most important ways new mothers need care are: sleep, nutrition, gentle exercise, and time off.* Who can come take care of mom during this period? Make a list of people with their contact information. List some activities that make you feel cared for, rested, and energized? Who can make sure you have opportunities to do these things? Does mom have a space of her very own within your home that's just for her? Make a "Mama Cave" where she can be comfortable, rest, and renew herself  daily. ______________________________________________________________________________________________________________________________________________________________________________________________________________________________________________________________________________________________________________________________________________________________________________________________________    CARE FOR AND FEEDING BABY *Knowledgeable and encouraging people will offer the best support with regard to feeding your baby.* Educate yourself and choose the best feeding option for your baby. Make a list of people who will guide, support, and be a resource for you as your care for and feed your baby. (Friends that have breastfed or are currently breastfeeding, lactation consultants, breastfeeding support groups, etc.) Consider a postpartum doula. (These websites can give you information: dona.org & padanc.org) Seek out local breastfeeding resources like the breastfeeding support group at [facility] or [employer]. ______________________________________________________________________________________________________________________________________________________________________________________________________________________________________________________________________________________________________________________________________________________________________________________________________  CHORES AND ERRANDS Who can help with a thorough cleaning before baby is born? Make a list of people who will help with housekeeping and chores, like laundry, light cleaning, dishes, bathrooms, etc. Who can run some errands for you? What can you do to make sure you are stocked with basic supplies before baby is born? Who is going to do the  shopping? ______________________________________________________________________________________________________________________________________________________________________________________________________________________________________________________________________________________________________________________________________________________________________________________________________     Family Adjustment *Nurture yourselves.it helps parents be more loving and allows for better bonding with their child.* What sorts of things do you and partner enjoy   doing together? Which activities help you to connect and strengthen your relationship? Make a list of those things. Make a list of people whom you trust to care for your baby so you can have some time together as a couple. What types of things help partner feel connected to Mom? Make a list. What needs will partner have in order to bond with baby? Other children? Who will care for them when you go into labor and while you are in the hospital? Think about what the needs of your older children might be. Who can help you meet those needs? In what ways are you helping them prepare for bringing baby home? List some specific strategies you have for family adjustment. _______________________________________________________________________________________________________________________________________________________________________________________________________________________________________________________________________________________________________________________________________________  SUPPORT *Someone who can empathize with experiences normalizes your problems and makes them more bearable.* Make a list of other friends, neighbors, and/or co-workers you know with infants (and small children, if applicable) with whom you can connect. Make a list of local or online support groups, mom groups, etc. in which you can be  involved. ______________________________________________________________________________________________________________________________________________________________________________________________________________________________________________________________________________________________________________________________________________________________________________________________________  Childcare Plans Investigate and plan for childcare if mom is returning to work. Talk about mom's concerns about her transition back to work. Talk about partner's concerns regarding this transition.  Mental Health *Your mental health is one of the highest priorities for a pregnant or postpartum mom.* 1 in 5 women experience anxiety and/or depression from the time of conception through the first year after birth. Postpartum Mood Disorders are the #1 complication of pregnancy and childbirth and the suffering experienced by these mothers is not necessary! These illnesses are temporary and respond well to treatment, which often includes self-care, social support, talk therapy, and medication when needed. Women experiencing anxiety and depression often say things like: I'm supposed to be happywhy do I feel so sad?, Why can't I snap out of it?, I'm having thoughts that scare me. There is no need to be embarrassed if you are feeling these symptoms: Overwhelmed, anxious, angry, sad, guilty, irritable, hopeless, exhausted but can't sleep You are NOT alone. You are NOT to blame. With help, you WILL be well. Where can I find help? Medical professionals such as your OB, midwife, gynecologist, family practitioner, primary care provider, pediatrician, or mental health providers; [facility] support groups: Feelings After Birth, Breastfeeding Support Group, Baby and Me Group, and Fit 4 Two exercise classes. You have permission to ask for help. It will confirm your feelings, validate your experiences,  share/learn coping strategies, and gain support and encouragement as you heal. You are important! BRAINSTORM Make a list of local resources, including resources for mom and for partner. Identify support groups. Identify people to call late at night - include names and contact info. Talk with partner about perinatal mood and anxiety disorders. Talk with your OB, midwife, and doula about baby blues and about perinatal mood and anxiety disorders. Talk with your pediatrician about perinatal mood and anxiety disorders.   Support & Sanity Savers   What do you really need?  Basics In preparing for a new baby, many expectant parents spend hours shopping for baby clothes, decorating the nursery, and deciding which car seat to buy. Yet most don't think much about what the reality of parenting a newborn will be like, and what they need to make it through that. So, here is the advice of experienced parents. We know you'll read this, and think they're exaggerating, I don't really need that. Just trust [place] on these, OK? Plan for all of  this, and if it turns out you don't need it, come back and teach us how you did it!  Must-Haves (Once baby's survival needs are met, make sure you attend to your own survival needs!) Sleep An average newborn sleeps 16-18 hours per day, over 6-7 sleep periods, rarely more than three hours at a time. It is normal and healthy for a newborn to wake throughout the night... but really hard on parents!! Naps. Prioritize sleep above any responsibilities like: cleaning house, visiting friends, running errands, etc.  Sleep whenever baby sleeps. If you can't nap, at least have restful times when baby eats. The more rest you get, the more patient you will be, the more emotionally stable, and better at solving problems.  Food You may not have realized it would be difficult to eat when you have a newborn. Yet, when we talk to countless new parents, they say things like "it may be 2:00 pm  when I realize I haven't had breakfast yet." Or "every time we sit down to dinner, baby needs to eat, and my food gets cold, so I don't bother to eat it." Finger food. Before your baby is born, stock up with one months' worth of food that: 1) you can eat with one hand while holding a baby, 2) doesn't need to be prepped, 3) is good hot or cold, 4) doesn't spoil when left out for a few hours, and 5) you like to eat. Think about: nuts, dried fruit, Clif bars, pretzels, jerky, gogurt, baby carrots, apples, bananas, crackers, cheez-n-crackers, string cheese, hot pockets or frozen burritos to microwave, garden burgers and breakfast pastries to put in the toaster, yogurt drinks, etc. Restaurant Menus. Make lists of your favorite restaurants & menu items. When family/friends want to help, you can give specific information without much thought. They can either bring you the food or send gift cards for just the right meals. Freezer Meals.  Take some time to make a few meals to put in the freezer ahead of time.  Easy to freeze meals can be anything such as soup, lasagna, chicken pie, or spaghetti sauce. Set up a Meal Schedule.  Ask friends and family to sign up to bring you meals during the first few weeks of being home. (It can be passed around at baby showers!) You have no idea how helpful this will be until you are in the throes of parenting.  www.takethemameal.com is a great website to check out. Emotional Support Know who to call when you're stressed out. Parenting a newborn is very challenging work. There are times when it totally overwhelms your normal coping abilities. EVERY NEW PARENT NEEDS TO HAVE A PLAN FOR WHO TO CALL WHEN THEY JUST CAN'T COPE ANY MORE. (And it has to be someone other than the baby's other parent!) Before your baby is born, come up with at least one person you can call for support - write their phone number down and post it on the refrigerator. Anxiety & Sadness. Baby blues are normal after  pregnancy; however, there are more severe types of anxiety & sadness which can occur and should not be ignored.  They are always treatable, but you have to take the first step by reaching out for help. [facility] offers a "Mom Talk" group which meets every Tuesday from 10 am - 11 am.  This group is for new moms who need support and connection after their babies are born.  Call [phone].  Really, Really Helpful (Plan for them!   Make sure these happen often!!) Physical Support with Taking Care of Yourselves Asking friends and family. Before your baby is born, set up a schedule of people who can come and visit and help out (or ask a friend to schedule for you). Any time someone says let me know what I can do to help, sign them up for a day. When they get there, their job is not to take care of the baby (that's your job and your joy). Their job is to take care of you!  Postpartum doulas. If you don't have anyone you can call on for support, look into postpartum doulas:  professionals at helping parents with caring for baby, caring for themselves, getting breastfeeding started, and helping with household tasks. www.padanc.org is a helpful website for learning about doulas in our area. Peer Support / Parent Groups Why: One of the greatest ideas for new parents is to be around other new parents. Parent groups give you a chance to share and listen to others who are going through the same season of life, get a sense of what is normal infant development by watching several babies learn and grow, share your stories of triumph and struggles with empathetic ears, and forgive your own mistakes when you realize all parents are learning by trial and error. Where to find: There are many places you can meet other new parents throughout our community.  [facility] offers the following classes for new moms and their little ones:  Baby and Me (Birth to [place]) and Breastfeeding Support Group. Go to  www.conehealthybaby.com or call [phone] for more information. Time for your Relationship It's easy to get so caught up in meeting baby's immediate needs that it's hard to find time to connect with your partner, and meet the needs of your relationship. It's also easy to forget what quality time with your partner actually looks like. If you take your baby on a date, you'd be amazed how much of your couple time is spent feeding the baby, diapering the baby, admiring the baby, and talking about the baby. Dating: Try to take time for just the two of you. Babysitter tip: Sometimes when moms are breastfeeding a newborn, they find it hard to figure out how to schedule outings around baby's unpredictable feeding schedules. Have the babysitter come for a three hour period. When she comes over, if baby has just eaten, you can leave right away, and come back in two hours. If baby hasn't fed recently, you start the date at home. Once baby gets hungry and gets a good feeding in, you can head out for the rest of your date time. Date Nights at Home: If you can't get out, at least set aside one evening a week to prioritize your relationship: whenever baby dozes off or doesn't have any immediate needs, spend a little time focusing on each other. Potential conflicts: The main relationship conflicts that come up for new parents are: issues related to sexuality, financial stresses, a feeling of an unfair division of household tasks, and conflicts in parenting styles. The more you can work on these issues before baby arrives, the better!  Fun and Frills (Don't forget these and don't feel guilty for indulging in them!) Everyone has something in life that is a fun little treat that they do just for themselves. It may be: reading the morning paper, or going for a daily jog, or having coffee with a friend once a week, or going to a movie on Friday nights,  or fine chocolates, or bubble baths, or curling up with a good  book. Unless you do fun things for yourself every now and then, it's hard to have the energy for fun with your baby. Whatever your "special" treats are, make sure you find a way to continue to indulge in them after your baby is born. These special moments can recharge you, and allow you to return to baby with a new joy   PERINATAL MOOD DISORDERS: MATERNAL MENTAL HEALTH FROM CONCEPTION THROUGH THE POSTPARTUM PERIOD   _________________________________________Emergency and Crisis Resources If you are an imminent risk to self or others, are experiencing intense personal distress, and/or have noticed significant changes in activities of daily living, call:  911 [facility]: [phone]  [address], [place], [place], [address] Mobile Crisis: 877-626-1772 National Suicide Hotline: 988 Or visit the following crisis centers: Local Emergency Departments [facility]: [address],   [phone]. Hours: 8:30AM-5PM. Insurance Accepted: Medicaid, Medicare, and Uninsured.  [facility]:  [address], [place]  Mon-Friday 8am-3pm, [phone]                                                                                  ___________ Non-Crisis Resources To identify specific providers that are covered by your insurance, contact your insurance company or local agencies:  [facility]: 1-800-256-2452 [facility]: 888-581-9988 [employer] [facility]: 1-800-939-5911 Postpartum Support International- Warm-line: 1-800-944-4773                                                      __Outpatient Therapy and Medication Management   Providers:  [facility]: [phone] Hours: 9AM-5PM  Insurance Accepted: AARP, Aetna, BCBS, Cigna, [employer], Humana, Medicare  [facility] [phone] Hours: 8AM-5:30PM  nsurance Accepted: All insurances EXCEPT AARP, Aetna,  [employer], and Humana [facility]: [phone] Hours: 8AM-8PM Insurance Accepted: Aetna, BCBS, Cigna, [employer], Medicaid, Medicare, Uninsured [facility]: [phone] [facility]: [phone] Hours: 8:30AM-7PM Insurance Accepted: Aetna, BCBS, Medicaid, Medicare, Tricare, United Healthcare [facility]:  [phone]   Hours:9AM-5PM Insurance Accepted:  Aetna, BCBS, [facility], Medicaid, United Health Care  [facility]: [phone] Hours: 9AM-5:30PM Insurance Accepted: AARP, Aetna, BCBS, Cigna, and Medicaid, Medicare, United Health Care [facility]:  [phone] Hours: 9am-5pm Insurance Accepted: BCBS; they do not accept Medicaid/Medicare [facility]: [phone] Hours: 9am-9pm Insurance Accepted: All major insurance including Medicaid and Medicare [facility]: [phone] Hours: 9AM- 5PM Insurance Accepted: All insurances EXCEPT Medicaid and Medicare. [facility]: [phone]   ____________                                                                       Parenting Support Groups [facility]: [phone] [facility]:  [phone] Family Support Network: (support for children in the NICU and/or with special needs), [phone]   ___________                                                                 Mental Health Support Groups Mental Health Association: [phone]    _____________                                                                                  Online Resources Postpartum Support International: http://www.postpartum.net/  800-944-4PPD 2Moms Supporting Moms:  www.momssupportingmoms.net    

## 2021-11-18 ENCOUNTER — Encounter (HOSPITAL_COMMUNITY): Payer: Self-pay | Admitting: Obstetrics and Gynecology

## 2021-11-18 ENCOUNTER — Other Ambulatory Visit: Payer: Self-pay

## 2021-11-18 ENCOUNTER — Inpatient Hospital Stay (HOSPITAL_COMMUNITY)
Admission: AD | Admit: 2021-11-18 | Discharge: 2021-11-18 | Disposition: A | Discharge status: Home / Self Care | Payer: Medicaid Other | Attending: Obstetrics and Gynecology | Admitting: Obstetrics and Gynecology

## 2021-11-18 DIAGNOSIS — O99512 Diseases of the respiratory system complicating pregnancy, second trimester: Secondary | ICD-10-CM | POA: Insufficient documentation

## 2021-11-18 DIAGNOSIS — J111 Influenza due to unidentified influenza virus with other respiratory manifestations: Secondary | ICD-10-CM | POA: Diagnosis not present

## 2021-11-18 DIAGNOSIS — Z3A26 26 weeks gestation of pregnancy: Secondary | ICD-10-CM | POA: Diagnosis not present

## 2021-11-18 DIAGNOSIS — O2692 Pregnancy related conditions, unspecified, second trimester: Secondary | ICD-10-CM

## 2021-11-18 DIAGNOSIS — Z20822 Contact with and (suspected) exposure to covid-19: Secondary | ICD-10-CM | POA: Diagnosis not present

## 2021-11-18 LAB — RESP PANEL BY RT-PCR (FLU A&B, COVID) ARPGX2
Influenza A by PCR: NEGATIVE
Influenza B by PCR: NEGATIVE
SARS Coronavirus 2 by RT PCR: NEGATIVE

## 2021-11-18 MED ORDER — CYCLOBENZAPRINE HCL 5 MG PO TABS: 5.0000 mg | ORAL_TABLET | Freq: Three times a day (TID) | ORAL | 0 refills | Status: DC | PRN

## 2021-11-18 NOTE — MAU Provider Note (Signed)
History     CSN: [id number]  Arrival date and time: 11/18/21 2203   Event Date/Time   First Provider Initiated Contact with Patient 11/18/21 2251      Chief Complaint  Patient presents with   Nasal Congestion   HPI OMUNIQUE PEDERSON is a 25 y.o. [id number] at [redacted]w[redacted]d who presents with flu like symptoms. Symptoms started on Friday. Reports nasal congestion & productive cough. States her temp was up to 99.5 earlier today. Had 2 negative home covid tests over the weekend. States her son tested positive for flu earlier today. Has not taken anything for her symptoms.  Denies headache, ear pain, sore throat, chest pain, SOB, abdominal pain, or vaginal bleeding. Reports good fetal movement.   OB History     Gravida  7   Para  4   Term  3   Preterm  1   AB  2   Living  3      SAB  2   IAB      Ectopic      Multiple  0   Live Births  3           Past Medical History:  Diagnosis Date   ADHD (attention deficit hyperactivity disorder)    Allergy    Anxiety    Asthma    Eating disorder    Headache(784.0)    Kidney stone    kidney stones   Migraines    ODD (oppositional defiant disorder)    PID (acute pelvic inflammatory disease) 01/29/2016   PTSD (post-traumatic stress disorder)    Seizures (HCC)    diagnosed age 71    Past Surgical History:  Procedure Laterality Date   right knee surgery     TONSILLECTOMY AND ADENOIDECTOMY      Family History  Problem Relation Age of Onset   Diabetes Mother    Diabetes Sister    Breast cancer Maternal Grandmother    Diabetes Maternal Grandmother    Glaucoma Maternal Grandmother    Other Son        born with hole in heart   Other Daughter        born with hole in heart    Social History   Tobacco Use   Smoking status: Former    Packs/day: 0.50    Types: Cigarettes, E-cigarettes   Smokeless tobacco: Never   Tobacco comments:    nicotine patch  Vaping Use   Vaping Use: Former   Substances: Flavoring   Substance Use Topics   Alcohol use: Not Currently   Drug use: Not Currently    Types: Marijuana    Comment: Not since 02/13    Allergies:  Allergies  Allergen Reactions   Bee Venom Anaphylaxis   Penicillins Anaphylaxis    * Has tolerated several cephalosporins* Has patient had a PCN reaction causing immediate rash, facial/tongue/throat swelling, SOB or lightheadedness with hypotension: Unknown Has patient had a PCN reaction causing severe rash involving mucus membranes or skin necrosis: Unknown Has patient had a PCN reaction that required hospitalization: Unknown Has patient had a PCN reaction occurring within the last 10 years: Unknown If all of the above answers are "NO", then may proceed with Cephalosporin use.    Ativan [Lorazepam]     Aggressive-agitation   Prednisone Other (See Comments)    Bleeding nose bleeding and internal bleeding   Latex Rash    Medications Prior to Admission  Medication Sig Dispense Refill Last Dose   aspirin  81 MG EC tablet Take 2 tablets (162 mg total) by mouth daily. Swallow whole. 180 tablet 2 11/18/2021   levETIRAcetam (KEPPRA) 1000 MG tablet Take 1 tablet (1,000 mg total) by mouth 2 (two) times daily. 60 tablet 0 11/18/2021   nitrofurantoin, macrocrystal-monohydrate, (MACROBID) 100 MG capsule Take 1 capsule (100 mg total) by mouth at bedtime for 90 doses. 30 capsule 2 11/18/2021   Prenatal Vit-Fe Fumarate-FA (PRENATAL VITAMIN PO) Take by mouth.   11/18/2021   cyclobenzaprine (FLEXERIL) 10 MG tablet Take 1 tablet (10 mg total) by mouth 3 (three) times daily. 60 tablet 0    hydrOXYzine (ATARAX) 25 MG tablet Take 1 tablet (25 mg total) by mouth at bedtime. (Patient not taking: Reported on 11/03/2021) 30 tablet 0     Review of Systems  Constitutional:  Positive for fatigue. Negative for chills and fever.  HENT:  Positive for congestion. Negative for ear pain and sore throat.   Respiratory:  Positive for cough. Negative for shortness of breath  and wheezing.   Cardiovascular:  Negative for chest pain.  Gastrointestinal: Negative.   Neurological: Negative.   Physical Exam   Blood pressure 128/79, pulse (!) 104, temperature 98.5 F (36.9 C), temperature source Oral, resp. rate 20, height 5\' 6"  (1.676 m), weight 96.3 kg, SpO2 98 %, unknown if currently breastfeeding.  Physical Exam Vitals and nursing note reviewed.  Constitutional:      Appearance: Normal appearance. She is not ill-appearing.  HENT:     Head: Normocephalic and atraumatic.     Right Ear: Tympanic membrane, ear canal and external ear normal.     Left Ear: Tympanic membrane, ear canal and external ear normal.     Mouth/Throat:     Mouth: Mucous membranes are moist.     Pharynx: No oropharyngeal exudate or posterior oropharyngeal erythema.  Eyes:     General: No scleral icterus.    Conjunctiva/sclera: Conjunctivae normal.     Pupils: Pupils are equal, round, and reactive to light.  Cardiovascular:     Rate and Rhythm: Regular rhythm.     Heart sounds: Normal heart sounds.  Pulmonary:     Effort: Pulmonary effort is normal. No respiratory distress.     Breath sounds: Normal breath sounds. No wheezing.  Musculoskeletal:     Cervical back: Normal range of motion and neck supple.  Skin:    General: Skin is warm and dry.  Neurological:     General: No focal deficit present.     Mental Status: She is alert.   NST:  Baseline: 160 bpm, Variability: Good {> 6 bpm), Accelerations: 10x10, and Decelerations: Absent  MAU Course  Procedures Results for orders placed or performed during the hospital encounter of 11/18/21 (from the past 24 hour(s))  Resp Panel by RT-PCR (Flu A&B, Covid) Nasopharyngeal Swab     Status: None   Collection Time: 11/18/21 10:35 PM   Specimen: Nasopharyngeal Swab; Nasopharyngeal(NP) swabs in vial transport medium  Result Value Ref Range   SARS Coronavirus 2 by RT PCR NEGATIVE NEGATIVE   Influenza A by PCR NEGATIVE NEGATIVE   Influenza B  by PCR NEGATIVE NEGATIVE    MDM Patient presents with flu like symptoms & exposure. Symptoms started Friday. She has not taken anything for symptoms.  VS reassuring.  Lung sounds clear throughout.  Fetal tracing appropriate for gestational age.   COVID & flu swab are negative Discussed OTC meds to treat her symptoms  Assessment and Plan   1. Influenza-like  illness   2. [redacted] weeks gestation of pregnancy    -COVID & flu negative -OTC meds to treat symptoms - given list of pregnancy safe meds  Judeth Horn 11/18/2021, 10:51 PM

## 2021-11-18 NOTE — MAU Note (Signed)
PT SAYS ON Friday- COVID NEG AND TODAY  YOUNGEST SON HAD FLU FEELS CONGESTED IN NOSE AND CHEST AND PRODUCTIVE COUGH . ACHY AND WEAKNESS  [facility]- CLINIC  DID NOT CALL CLINIC

## 2021-11-18 NOTE — Discharge Instructions (Signed)

## 2021-11-21 ENCOUNTER — Inpatient Hospital Stay (HOSPITAL_COMMUNITY)
Admission: AD | Admit: 2021-11-21 | Discharge: 2021-11-22 | Disposition: A | Discharge status: Home / Self Care | Payer: Medicaid Other | Attending: Obstetrics and Gynecology | Admitting: Obstetrics and Gynecology

## 2021-11-21 ENCOUNTER — Inpatient Hospital Stay (HOSPITAL_COMMUNITY): Payer: Medicaid Other

## 2021-11-21 DIAGNOSIS — O99352 Diseases of the nervous system complicating pregnancy, second trimester: Secondary | ICD-10-CM | POA: Diagnosis present

## 2021-11-21 DIAGNOSIS — Z20822 Contact with and (suspected) exposure to covid-19: Secondary | ICD-10-CM | POA: Insufficient documentation

## 2021-11-21 DIAGNOSIS — Z7982 Long term (current) use of aspirin: Secondary | ICD-10-CM | POA: Diagnosis not present

## 2021-11-21 DIAGNOSIS — O99353 Diseases of the nervous system complicating pregnancy, third trimester: Secondary | ICD-10-CM

## 2021-11-21 DIAGNOSIS — Z3A27 27 weeks gestation of pregnancy: Secondary | ICD-10-CM | POA: Diagnosis not present

## 2021-11-21 DIAGNOSIS — Z880 Allergy status to penicillin: Secondary | ICD-10-CM | POA: Insufficient documentation

## 2021-11-21 DIAGNOSIS — O0990 Supervision of high risk pregnancy, unspecified, unspecified trimester: Secondary | ICD-10-CM

## 2021-11-21 DIAGNOSIS — R569 Unspecified convulsions: Secondary | ICD-10-CM

## 2021-11-21 DIAGNOSIS — G40909 Epilepsy, unspecified, not intractable, without status epilepticus: Secondary | ICD-10-CM | POA: Insufficient documentation

## 2021-11-21 DIAGNOSIS — Z87891 Personal history of nicotine dependence: Secondary | ICD-10-CM | POA: Diagnosis not present

## 2021-11-21 LAB — COMPREHENSIVE METABOLIC PANEL
ALT: 12 U/L (ref 0–44)
AST: 13 U/L — ABNORMAL LOW (ref 15–41)
Albumin: 2.7 g/dL — ABNORMAL LOW (ref 3.5–5.0)
Alkaline Phosphatase: 79 U/L (ref 38–126)
Anion gap: 9 (ref 5–15)
BUN: 8 mg/dL (ref 6–20)
CO2: 19 mmol/L — ABNORMAL LOW (ref 22–32)
Calcium: 8.5 mg/dL — ABNORMAL LOW (ref 8.9–10.3)
Chloride: 105 mmol/L (ref 98–111)
Creatinine, Ser: 0.62 mg/dL (ref 0.44–1.00)
GFR, Estimated: >60 mL/min (ref >60)
Glucose, Bld: 114 mg/dL — ABNORMAL HIGH (ref 70–99)
Potassium: 3.6 mmol/L (ref 3.5–5.1)
Sodium: 133 mmol/L — ABNORMAL LOW (ref 135–145)
Total Bilirubin: <0.1 mg/dL — ABNORMAL LOW (ref 0.3–1.2)
Total Protein: 6.2 g/dL — ABNORMAL LOW (ref 6.5–8.1)

## 2021-11-21 LAB — CBC
HCT: 32.9 % — ABNORMAL LOW (ref 36.0–46.0)
Hemoglobin: 11.3 g/dL — ABNORMAL LOW (ref 12.0–15.0)
MCH: 31.6 pg (ref 26.0–34.0)
MCHC: 34.3 g/dL (ref 30.0–36.0)
MCV: 91.9 fL (ref 80.0–100.0)
Platelets: 240 10*3/uL (ref 150–400)
RBC: 3.58 MIL/uL — ABNORMAL LOW (ref 3.87–5.11)
RDW: 11.9 % (ref 11.5–15.5)
WBC: 15.7 10*3/uL — ABNORMAL HIGH (ref 4.0–10.5)
nRBC: 0 % (ref 0.0–0.2)

## 2021-11-21 LAB — TYPE AND SCREEN
ABO/RH(D): O POS
Antibody Screen: NEGATIVE

## 2021-11-21 LAB — RESP PANEL BY RT-PCR (FLU A&B, COVID) ARPGX2
Influenza A by PCR: NEGATIVE
Influenza B by PCR: NEGATIVE
SARS Coronavirus 2 by RT PCR: NEGATIVE

## 2021-11-21 LAB — MAGNESIUM: Magnesium: 1.7 mg/dL (ref 1.7–2.4)

## 2021-11-21 LAB — PHOSPHORUS: Phosphorus: 3.8 mg/dL (ref 2.5–4.6)

## 2021-11-21 IMAGING — DX DG SHOULDER 2+V PORT*R*
1 series · 1 of 1 positions shown · non-contrast
Comparison: None.

CLINICAL DATA: Fall.  Right shoulder pain.

EXAM:
PORTABLE RIGHT SHOULDER

[shoulder ap]
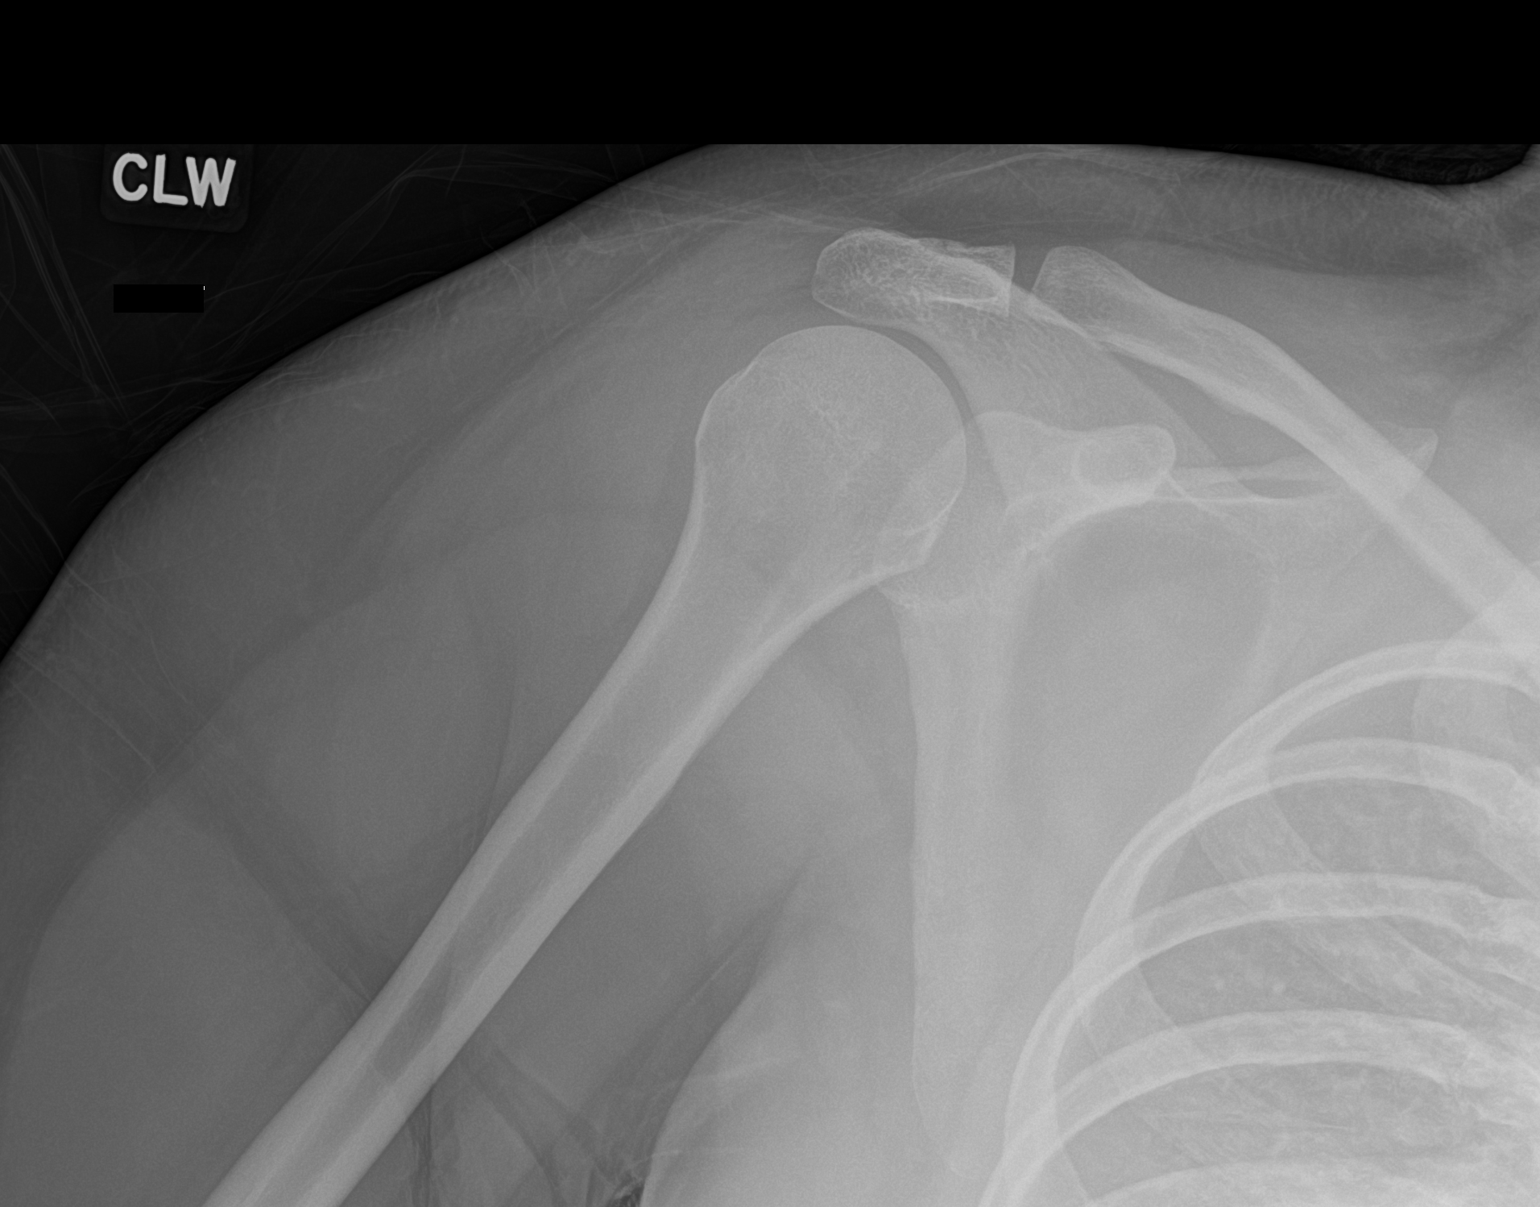

[1 of 1 positions shown; findings below may reference images not displayed]

FINDINGS: Single view of the right shoulder shows no evidence of a fracture.
Glenohumeral and AC joints appear normally spaced and aligned.

Surrounding soft tissues are unremarkable.
IMPRESSION: Negative.

## 2021-11-21 MED ORDER — ASPIRIN EC 81 MG PO TBEC
162.0000 mg | DELAYED_RELEASE_TABLET | Freq: Every day | ORAL | Status: DC
  Administered 2021-11-22: 162 mg via ORAL
  Filled 2021-11-21 (×2): qty 2

## 2021-11-21 MED ORDER — CALCIUM CARBONATE ANTACID 500 MG PO CHEW: 2.0000 | CHEWABLE_TABLET | ORAL | Status: DC | PRN

## 2021-11-21 MED ORDER — PRENATAL MULTIVITAMIN CH
1.0000 | ORAL_TABLET | Freq: Every day | ORAL | Status: DC
  Administered 2021-11-22: 1 via ORAL
  Filled 2021-11-21: qty 1

## 2021-11-21 MED ORDER — PRENATAL VITAMIN 27-0.8 MG PO TABS: 1.0000 | ORAL_TABLET | Freq: Every day | ORAL | Status: DC

## 2021-11-21 MED ORDER — LACTATED RINGERS IV BOLUS
1000.0000 mL | Freq: Once | INTRAVENOUS | Status: AC
  Administered 2021-11-21: 19:00:00 1000 mL via INTRAVENOUS

## 2021-11-21 MED ORDER — DOCUSATE SODIUM 100 MG PO CAPS
100.0000 mg | ORAL_CAPSULE | Freq: Every day | ORAL | Status: DC
  Administered 2021-11-22: 100 mg via ORAL
  Filled 2021-11-21: qty 1

## 2021-11-21 MED ORDER — LORAZEPAM 2 MG/ML IJ SOLN
INTRAMUSCULAR | Status: AC
  Administered 2021-11-21: 20:00:00 2 mg via INTRAVENOUS
  Filled 2021-11-21: qty 1

## 2021-11-21 MED ORDER — LORAZEPAM 2 MG/ML IJ SOLN
INTRAMUSCULAR | Status: AC
  Administered 2021-11-21: 21:00:00 2 mg via INTRAVENOUS
  Filled 2021-11-21: qty 1

## 2021-11-21 MED ORDER — LEVETIRACETAM 500 MG PO TABS
1000.0000 mg | ORAL_TABLET | Freq: Two times a day (BID) | ORAL | Status: DC
  Filled 2021-11-21: qty 2

## 2021-11-21 MED ORDER — LORAZEPAM 2 MG/ML IJ SOLN: 2.0000 mg | Freq: Once | INTRAMUSCULAR | Status: AC

## 2021-11-21 MED ORDER — ACETAMINOPHEN 325 MG PO TABS: 650.0000 mg | ORAL_TABLET | ORAL | Status: DC | PRN

## 2021-11-21 MED ORDER — LEVETIRACETAM IN NACL 1500 MG/100ML IV SOLN
1500.0000 mg | Freq: Once | INTRAVENOUS | Status: AC
  Administered 2021-11-21: 20:00:00 1500 mg via INTRAVENOUS
  Filled 2021-11-21 (×2): qty 100

## 2021-11-21 MED ORDER — LEVETIRACETAM IN NACL 1500 MG/100ML IV SOLN
1500.0000 mg | Freq: Once | INTRAVENOUS | Status: AC
  Administered 2021-11-21: 20:00:00 1500 mg via INTRAVENOUS
  Filled 2021-11-21: qty 100

## 2021-11-21 MED ORDER — LACTATED RINGERS IV SOLN: INTRAVENOUS | Status: DC

## 2021-11-21 NOTE — Consult Note (Addendum)
NEURO HOSPITALIST CONSULT NOTE   Requesting physician: Dr. Donavan Foil  Reason for Consult: Breakthrough seizures  History obtained from:  Partner and Chart     HPI:                                                                                                                                          Erin Krueger is a 25 y.o. female who is [redacted] weeks pregnant, with a PMHx of ADHD, asthma, anxiety, eating disorder, headache, renal stones, migraines, oppositional defiant disorder, PID and PTSD presenting acutely to the [facility] from home via POV for breakthrough seizures. 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   She sees a Neurologist outpatient and is on Keppra 1000 mg BID. She last had seizures in October for which she was assessed in a hospital setting. Last seizures before that were in April. Per her Significant Other, her seizures tend to come in pairs, each lasting for about 10 seconds with semiology of a blank stare with hands clenched and arms stiff at her sides. She often can sense when a seizure is about to start and has been driven to the hospital in the past with the aura followed by seizure activity. She will be postictal for several minutes afterwards and gradually "comes out of it".   Today, she sensed that a seizure was coming on, so she asked her partner to drive her to the hospital. En route she had 2 of her typical seizures, with urinary incontinence. After arriving to the MAU, she had another seizure lasting for 10-15 seconds initially with horizontal pendular eye movements, then stiffening with back and neck arched, arms extended, hands clenched followed by clonic movements. She was densely postictal afterwards but was awakening on arrival of Neurologist. She subsequently had another seizure with the same semiology, a portion of which was witnessed by Neurology.  Subsequently, she was arousable to the extent that she could answer simple questions and follow commands, but still confused and also complaining of seeing "blue spots" on the ceiling.   Past Medical History:  Diagnosis Date   ADHD (attention deficit hyperactivity disorder)    Allergy    Anxiety    Asthma    Eating disorder    Headache(784.0)    Kidney stone    kidney stones   Migraines    ODD (oppositional defiant disorder)    PID (acute pelvic inflammatory disease) 01/29/2016   PTSD (post-traumatic stress disorder)    Seizures (HCC)    diagnosed age 33    Past Surgical History:  Procedure Laterality Date   right knee surgery     TONSILLECTOMY AND ADENOIDECTOMY  Family History  Problem Relation Age of Onset   Diabetes Mother    Diabetes Sister    Breast cancer Maternal Grandmother    Diabetes Maternal Grandmother    Glaucoma Maternal Grandmother    Other Son        born with hole in heart   Other Daughter        born with hole in heart              Social History:  reports that she has quit smoking. Her smoking use included cigarettes and e-cigarettes. She smoked an average of .5 packs per day. She has never used smokeless tobacco. She reports that she does not currently use alcohol. She reports that she does not currently use drugs after having used the following drugs: Marijuana.  Allergies  Allergen Reactions   Bee Venom Anaphylaxis   Penicillins Anaphylaxis    * Has tolerated several cephalosporins* Has patient had a PCN reaction causing immediate rash, facial/tongue/throat swelling, SOB or lightheadedness with hypotension: Unknown Has patient had a PCN reaction causing severe rash involving mucus membranes or skin necrosis: Unknown Has patient had a PCN reaction that required hospitalization: Unknown Has patient had a PCN reaction occurring within the last 10 years: Unknown If all of the above answers are "NO", then may proceed with Cephalosporin use.     Ativan [Lorazepam]     Aggressive-agitation   Prednisone Other (See Comments)    Bleeding nose bleeding and internal bleeding   Latex Rash    MEDICATIONS:                                                                                                                     Prior to Admission:  Medications Prior to Admission  Medication Sig Dispense Refill Last Dose   aspirin 81 MG EC tablet Take 2 tablets (162 mg total) by mouth daily. Swallow whole. 180 tablet 2 11/21/2021 at 0830   levETIRAcetam (KEPPRA) 1000 MG tablet Take 1 tablet (1,000 mg total) by mouth 2 (two) times daily. 60 tablet 0 11/21/2021 at 0830   nitrofurantoin, macrocrystal-monohydrate, (MACROBID) 100 MG capsule Take 1 capsule (100 mg total) by mouth at bedtime for 90 doses. 30 capsule 2 11/21/2021 at 0830   Prenatal Vit-Fe Fumarate-FA (PRENATAL VITAMIN PO) Take by mouth.   11/21/2021 at 0830   Scheduled:  [START ON 11/22/2021] docusate sodium  100 mg Oral Daily   [START ON 11/22/2021] prenatal multivitamin  1 tablet Oral Q1200   Continuous:  lactated ringers       ROS:  Unable to obtain due to postictal state.    Blood pressure (!) 156/57, pulse (!) 111, temperature 97.7 F (36.5 C), temperature source Axillary, SpO2 99 %, unknown if currently breastfeeding.   General Examination:                                                                                                       Physical Exam  HEENT-  [place]/AT   Lungs- Respirations unlabored Extremities- No edema  Neurological Examination Mental Status: Postictal confusion. Speech is hypophonic but fluent and nondysarthric, although she does speak only with short phrases. Able to follow simple commands. Oriented to "Saturday", "2021" and "November". Not oriented to the city but knows that the state is [place].  Cranial Nerves: II:  Visual field testing is inconsistent on three separate assessments. Best visual field responses across assessments reveals no definite field cut. PERRL.  III,IV, VI: EOMI without nystagmus. No ptosis. V: Subjectively decreased temp sensation on the right.  VII: Smile symmetric but did appear to volitionally contract left side of mouth more than right transiently.  VIII: Hearing intact to questions and commands IX,X: Hypophonic speech XI: head is midline.  XII: Midline tongue extension Motor: Significant giveway weakness with poor and inconsistent effort x 4, which patient endorses is due to fatigue. In this context maximum strength elicitable as follows: BUE 4/5 proximally and distally.  BLE 4-/5 proximally and distally No asymmetry noted.  No pronator drift.  Tone and bulk normal.  Sensory:  Temperature: Intact to BLE. Subjectively decreased on the right upper extremity, normal on the left.  FT: Subjectively diminished on the LEFT upper extremity, normal on the right, diminished bilateral lower extremities.  Positive for intermittent extinction on the right to DSS.  Deep Tendon Reflexes: 2+ and symmetric bilateral brachioradialis, biceps, patellae and achilles Plantars: Right: downgoing   Left: downgoing Cerebellar: Slow but without ataxia with FNF bilaterally Gait: Deferred   Lab Results: Basic Metabolic Panel: No results for input(s): NA, K, CL, CO2, GLUCOSE, BUN, CREATININE, CALCIUM, MG, PHOS in the last 168 hours.  CBC: Recent Labs  Lab 11/21/21 1920  WBC 15.7*  HGB 11.3*  HCT 32.9*  MCV 91.9  PLT 240    Cardiac Enzymes: No results for input(s): CKTOTAL, CKMB, CKMBINDEX, TROPONINI in the last 168 hours.  Lipid Panel: No results for input(s): CHOL, TRIG, HDL, CHOLHDL, VLDL, LDLCALC in the last 168 hours.  Imaging: DG Shoulder Right Portable  Result Date: 11/21/2021 CLINICAL DATA:  Fall.  Right shoulder pain. EXAM: PORTABLE RIGHT SHOULDER COMPARISON:  None.  FINDINGS: Single view of the right shoulder shows no evidence of a fracture. Glenohumeral and AC joints appear normally spaced and aligned. Surrounding soft tissues are unremarkable. IMPRESSION: Negative. Electronically Signed   By: Amie Portland M.D.   On: 11/21/2021 19:53     Assessment: 25 year old female, [redacted] weeks pregnant, with a history of epilepsy since childhood, who presents with breakthrough seizures.  1. Exam reveals findings most consistent with postictal state. No focal motor weakness or sensory loss seen on exam. Reflexes  are normal. After initially being hypertensive, BP has normalized to 112/71.  2. Loaded with a total of 3000 mg Keppra at time of evaluation. Received 2 mg IV Ativan x 2 for initial seizure prior to consult, then a second dose of 2 mg IV for seizure recurrence.  3. Per report, the patient also has a history of pseudoseizures. However, the semiology of the 1st described event as well as the portion of the second spell observed by Neurohospitalist are most consistent with epileptic seizures.  4. Overall risks of Ativan for the fetus are felt to be outweighed by benefits of seizure cessation for both fetus and mother.  5. Preliminary review of LTM EEG tracings reveals intermittent alpha wave activity bilaterally with no electrographic seizures. LTM EEG was placed after the witnessed seizures described above.   Recommendations: 1. Increase her scheduled home dose of Keppra 1000 mg BID to 1500 mg BID.  2. Continue to monitor clinically and with LTM EEG.  3. Official EEG read pending for the AM 4. Inpatient seizure precautions.  5. Outpatient seizure precautions: Per [facility] statutes, patients with seizures are not allowed to drive until  they have been seizure-free for six months. Use caution when using heavy equipment or power tools. Avoid working on ladders or at heights. Take showers instead of baths. Ensure the water temperature is not too high on the home  water heater. Do not go swimming alone. When caring for infants or small children, sit down when holding, feeding, or changing them to minimize risk of injury to the child in the event you have a seizure. Also, Maintain good sleep hygiene. Avoid alcohol.   Electronically signed: Dr. Caryl Pina 11/21/2021, 8:05 PM

## 2021-11-21 NOTE — BH Specialist Note (Signed)
Integrated Behavioral Health via Telemedicine Visit  11/21/2021 HAASINI PATNAUDE [id number]  Number of Integrated Behavioral Health visits: 4 Session Start time: 10:45  Session End time: 12:00 Total time:  9  Referring Provider: Shawna Clamp, CNM Patient/Family location: Home [facility] Provider location: [facility]  All persons participating in visit: [name] and [facility] Kam[facility] Buckbee   Types of Service: Individual psychotherapy and Video visit  I connected with [name] and/or [place] [facility]'s  n/a  via  Telephone or Engineer, civil (consulting)  (Video is Surveyor, mining) and verified that I am speaking with the correct person using two identifiers. Discussed confidentiality: Yes   I discussed the limitations of telemedicine and the availability of in person appointments.  Discussed there is a possibility of technology failure and discussed alternative modes of communication if that failure occurs.  I discussed that engaging in this telemedicine visit, they consent to the provision of behavioral healthcare and the services will be billed under their insurance.  Patient and/or legal guardian expressed understanding and consented to Telemedicine visit: Yes   Presenting Concerns: Patient and/or family reports the following symptoms/concerns: Increased stress and anxiety regarding high-risk pregnancy status, as well as distress at the possibility of CPS involvement, due to previous involvement, due to misunderstanding. Pt's goal is to have the opportunity to keep her baby. Pt has supplies ready for baby, safe car, new home in two weeks, taking parenting classes, and has reduced tobacco use from pack/day prior to pregnancy to only 4 cigarettes/day.  Duration of problem: Current pregnancy; Severity of problem:  moderate  Patient and/or Family's Strengths/Protective Factors: Social connections,  Concrete supports in place (healthy food, safe environments, etc.), Sense of purpose, Physical Health (exercise, healthy diet, medication compliance, etc.), and Caregiver has knowledge of parenting & child development  Goals Addressed: Patient will:  Reduce symptoms of: anxiety and stress   Demonstrate ability to: Increase healthy adjustment to current life circumstances  Progress towards Goals: Ongoing  Interventions: Interventions utilized:  Solution-Focused Strategies Standardized Assessments completed:  PHQ9/GAD7 in past two weeks  Patient and/or Family Response: Pt agrees with treatment plan [facility] has not called to schedule appointment, per both pt AND referral order notes)  Assessment: Patient currently experiencing PTSD, ADHD (both previously diagnosed by psychiatry).   Patient may benefit from continued therapeutic interventions.  Plan: Follow up with behavioral health clinician on : Two weeks Behavioral recommendations:  -Continue attending scheduled childcare classes; FOB attending [employer] classes -Continue taking prenatal vitamin daily as prescribed -Continue plan to move into new home in two weeks -Continue reducing cigarettes to 4/ -Consider walk-in option by 7:30am [facility] to establish for outpatient treatment, if they do not call within the next two weeks to schedule Referral(s): Integrated [employer] (In Clinic)  I discussed the assessment and treatment plan with the patient and/or parent/guardian. They were provided an opportunity to ask questions and all were answered. They agreed with the plan and demonstrated an understanding of the instructions.   They were advised to call back or seek an in-person evaluation if the symptoms worsen or if the condition fails to improve as anticipated.  Rae Lips, LCSW  Depression screen [facility] 2/9 12/01/2021 11/03/2021 10/01/2021 08/12/2021 07/19/2019  Decreased Interest 0 0 0 0 0   Down, Depressed, Hopeless 0 0 0 0 0  PHQ - 2 Score 0 0 0 0 0  Altered sleeping  1 0 1 1 0  Tired, decreased energy 0 0 0 1 0  Change in appetite 0 0 0 0 0  Feeling bad or failure about yourself  0 0 0 0 0  Trouble concentrating 0 0 0 0 0  Moving slowly or fidgety/restless 0 0 0 0 0  Suicidal thoughts 0 0 0 0 0  PHQ-9 Score 1 0 1 2 0  Difficult doing work/chores - - - - -   GAD 7 : Generalized Anxiety Score 12/01/2021 11/03/2021 10/01/2021 08/12/2021  Nervous, Anxious, on Edge 0 0 0 0  Control/stop worrying 0 0 0 0  Worry too much - different things 0 0 0 0  Trouble relaxing 0 0 0 0  Restless 0 0 0 0  Easily annoyed or irritable 0 0 0 1  Afraid - awful might happen 0 0 0 0  Total GAD 7 Score 0 0 0 1

## 2021-11-21 NOTE — H&P (Signed)
History     CSN: [id number]  Arrival date and time: 11/21/21 1927   Chief Complaint  Patient presents with   Seizures   HPI Erin Krueger is a 25 y.o. [id number] at [redacted]w[redacted]d who presents with seizures. At [place], a medical emergency in the drop off outside was called. CNM and RNs presented immediately and found patient in passenger seat of car unresponsive and incontinent of urine. Patient's partner states patient had 2 seizures on the way to the hospital, each approximately 30-45 seconds. He reports she has epilepsy and takes Keppra daily. Patient was post ictal on arrival. Fetal heart tones found immediately when patient was brought to room.   Unable to obtain history from patient given postictal state.  OB History     Gravida  7   Para  4   Term  3   Preterm  1   AB  2   Living  3      SAB  2   IAB      Ectopic      Multiple  0   Live Births  3           Past Medical History:  Diagnosis Date   ADHD (attention deficit hyperactivity disorder)    Allergy    Anxiety    Asthma    Eating disorder    Headache(784.0)    Kidney stone    kidney stones   Migraines    ODD (oppositional defiant disorder)    PID (acute pelvic inflammatory disease) 01/29/2016   PTSD (post-traumatic stress disorder)    Seizures ([place])    diagnosed age 52    Past Surgical History:  Procedure Laterality Date   right knee surgery     TONSILLECTOMY AND ADENOIDECTOMY      Family History  Problem Relation Age of Onset   Diabetes Mother    Diabetes Sister    Breast cancer Maternal Grandmother    Diabetes Maternal Grandmother    Glaucoma Maternal Grandmother    Other Son        born with hole in heart   Other Daughter        born with hole in heart    Social History   Tobacco Use   Smoking status: Former    Packs/day: 0.50    Types: Cigarettes, E-cigarettes   Smokeless tobacco: Never   Tobacco comments:    nicotine patch  Vaping Use   Vaping Use: Former    Substances: Flavoring  Substance Use Topics   Alcohol use: Not Currently   Drug use: Not Currently    Types: Marijuana    Comment: Not since 02/13    Allergies:  Allergies  Allergen Reactions   Bee Venom Anaphylaxis   Penicillins Anaphylaxis    * Has tolerated several cephalosporins* Has patient had a PCN reaction causing immediate rash, facial/tongue/throat swelling, SOB or lightheadedness with hypotension: Unknown Has patient had a PCN reaction causing severe rash involving mucus membranes or skin necrosis: Unknown Has patient had a PCN reaction that required hospitalization: Unknown Has patient had a PCN reaction occurring within the last 10 years: Unknown If all of the above answers are "NO", then may proceed with Cephalosporin use.    Ativan [Lorazepam]     Aggressive-agitation   Prednisone Other (See Comments)    Bleeding nose bleeding and internal bleeding   Latex Rash    Medications Prior to Admission  Medication Sig Dispense Refill Last Dose  aspirin 81 MG EC tablet Take 2 tablets (162 mg total) by mouth daily. Swallow whole. 180 tablet 2    cyclobenzaprine (FLEXERIL) 5 MG tablet Take 1 tablet (5 mg total) by mouth 3 (three) times daily as needed for muscle spasms. 20 tablet 0    levETIRAcetam (KEPPRA) 1000 MG tablet Take 1 tablet (1,000 mg total) by mouth 2 (two) times daily. 60 tablet 0    nitrofurantoin, macrocrystal-monohydrate, (MACROBID) 100 MG capsule Take 1 capsule (100 mg total) by mouth at bedtime for 90 doses. 30 capsule 2    Prenatal Vit-Fe Fumarate-FA (PRENATAL VITAMIN PO) Take by mouth.       Review of Systems  Constitutional: Negative.  Negative for fatigue and fever.  HENT: Negative.    Respiratory: Negative.  Negative for shortness of breath.   Cardiovascular: Negative.  Negative for chest pain.  Gastrointestinal: Negative.  Negative for abdominal pain, constipation, diarrhea, nausea and vomiting.  Genitourinary: Negative.  Negative for dysuria.   Neurological:  Positive for seizures and headaches. Negative for dizziness.  Physical Exam   Blood pressure 121/66, pulse (!) 142, temperature 97.7 F (36.5 C), temperature source Axillary, resp. rate 19, SpO2 99 %, unknown if currently breastfeeding.  Patient Vitals for the past 24 hrs:  BP Temp Temp src Pulse Resp SpO2  11/21/21 2200 -- -- -- -- -- 99 %  11/21/21 2155 -- -- -- -- -- 98 %  11/21/21 2150 -- -- -- -- -- 97 %  11/21/21 2146 121/66 -- -- (!) 142 19 --  11/21/21 2145 -- -- -- -- -- 97 %  11/21/21 2140 -- -- -- -- -- 96 %  11/21/21 2135 -- -- -- -- -- 97 %  11/21/21 2125 -- -- -- -- -- 97 %  11/21/21 2120 -- -- -- -- -- 97 %  11/21/21 2116 121/69 -- -- (!) 115 -- --  11/21/21 2115 -- -- -- -- -- 98 %  11/21/21 2110 -- -- -- -- -- 98 %  11/21/21 2105 -- -- -- -- -- 98 %  11/21/21 2100 -- -- -- -- -- 99 %  11/21/21 2055 -- -- -- -- -- 98 %  11/21/21 2050 -- -- -- -- -- 98 %  11/21/21 2046 126/85 -- -- (!) 115 -- --  11/21/21 2045 -- -- -- -- -- 99 %  11/21/21 2040 -- -- -- -- -- 98 %  11/21/21 2037 129/76 -- -- (!) 115 -- --  11/21/21 2035 -- -- -- -- -- 98 %  11/21/21 2031 113/88 -- -- (!) 114 -- --  11/21/21 2030 -- -- -- -- -- 98 %  11/21/21 2026 135/67 -- -- (!) 111 -- --  11/21/21 2025 -- -- -- -- -- 99 %  11/21/21 2021 126/72 -- -- (!) 115 -- --  11/21/21 2020 -- -- -- -- -- 98 %  11/21/21 2016 119/82 -- -- (!) 111 -- --  11/21/21 2015 -- -- -- -- -- 98 %  11/21/21 2012 129/84 -- -- (!) 111 -- --  11/21/21 2010 -- -- -- -- -- 99 %  11/21/21 2006 (!) 155/55 -- -- (!) 110 -- 98 %  11/21/21 2005 -- -- -- -- -- 98 %  11/21/21 2001 (!) 143/52 -- -- (!) 110 -- --  11/21/21 1956 (!) 156/57 -- -- (!) 111 -- --  11/21/21 1951 (!) 145/73 -- -- (!) 110 -- --  11/21/21 1946 131/80 -- -- (!) 109 -- --  11/21/21 1941 (!) 142/55 -- -- (!) 109 -- --  11/21/21 1936 (!) 116/57 -- -- (!) 101 -- --  11/21/21 1935 -- -- -- -- -- 99 %  11/21/21 1931 91/62 -- -- 91 -- 98 %   11/21/21 1929 (!) 81/62 97.7 F (36.5 C) Axillary (!) 125 -- 98 %     Physical Exam Vitals and nursing note reviewed.  Constitutional:      General: She is not in acute distress.    Appearance: She is well-developed. She is ill-appearing.  HENT:     Head: Normocephalic.  Eyes:     Pupils: Pupils are equal, round, and reactive to light.  Cardiovascular:     Rate and Rhythm: Normal rate and regular rhythm.     Heart sounds: Normal heart sounds.  Pulmonary:     Effort: Pulmonary effort is normal. No respiratory distress.     Breath sounds: Normal breath sounds.  Abdominal:     General: Bowel sounds are normal. There is no distension.     Palpations: Abdomen is soft.     Tenderness: There is no abdominal tenderness.  Skin:    General: Skin is warm and dry.  Neurological:     Mental Status: She is alert and oriented to person, place, and time.     Motor: Abnormal muscle tone and seizure activity present.  Psychiatric:        Mood and Affect: Mood normal.        Behavior: Behavior normal.        Thought Content: Thought content normal.        Judgment: Judgment normal.   Fetal Tracing:  Baseline: 160 Variability: moderate Accels: 10x10 Decels: none  Toco: none   MAU Course  Procedures Results for orders placed or performed during the hospital encounter of 11/21/21 (from the past 24 hour(s))  CBC     Status: Abnormal   Collection Time: 11/21/21  7:20 PM  Result Value Ref Range   WBC 15.7 (H) 4.0 - 10.5 K/uL   RBC 3.58 (L) 3.87 - 5.11 MIL/uL   Hemoglobin 11.3 (L) 12.0 - 15.0 g/dL   HCT 57.3 (L) 45.7 - 89.9 %   MCV 91.9 80.0 - 100.0 fL   MCH 31.6 26.0 - 34.0 pg   MCHC 34.3 30.0 - 36.0 g/dL   RDW 04.1 79.2 - 04.5 %   Platelets 240 150 - 400 K/uL   nRBC 0.0 0.0 - 0.2 %  Comprehensive metabolic panel     Status: Abnormal   Collection Time: 11/21/21  7:20 PM  Result Value Ref Range   Sodium 133 (L) 135 - 145 mmol/L   Potassium 3.6 3.5 - 5.1 mmol/L   Chloride 105  98 - 111 mmol/L   CO2 19 (L) 22 - 32 mmol/L   Glucose, Bld 114 (H) 70 - 99 mg/dL   BUN 8 6 - 20 mg/dL   Creatinine, Ser 4.25 0.44 - 1.00 mg/dL   Calcium 8.5 (L) 8.9 - 10.3 mg/dL   Total Protein 6.2 (L) 6.5 - 8.1 g/dL   Albumin 2.7 (L) 3.5 - 5.0 g/dL   AST 13 (L) 15 - 41 U/L   ALT 12 0 - 44 U/L   Alkaline Phosphatase 79 38 - 126 U/L   Total Bilirubin <0.1 (L) 0.3 - 1.2 mg/dL   GFR, Estimated >20 >53 mL/min   Anion gap 9 5 - 15  Resp Panel by RT-PCR (Flu A&B, Covid) Nasopharyngeal Swab  Status: None   Collection Time: 11/21/21  7:40 PM   Specimen: Nasopharyngeal Swab; Nasopharyngeal(NP) swabs in vial transport medium  Result Value Ref Range   SARS Coronavirus 2 by RT PCR NEGATIVE NEGATIVE   Influenza A by PCR NEGATIVE NEGATIVE   Influenza B by PCR NEGATIVE NEGATIVE  Type and screen [facility]     Status: None (Preliminary result)   Collection Time: 11/21/21  8:32 PM  Result Value Ref Range   ABO/RH(D) PENDING    Antibody Screen PENDING    Sample Expiration      11/24/2021,2359 Performed at [facility], [place] [address] [place], [place] [address]     MDM Dr. Elgie Congo met CNM at car to assess patient as patient was transported to MAU room CBC, CMP, Protein/creat ratio UA, UDS LR infusion Keppra bolus $RemoveBefo'1500mg'ZlEHdQqVJBg$   Dr. Elgie Congo called neurologist to come to bedside at 1935 NST reassuring for gestational age  60: patient reports feeling unwell and like the room is spinning. Immediately after, patient's eyes started rapidly moving and rolled to back of head. Seizure activity began and a tonic clonic seizure noted lasting 15 seconds. Patient significantly more confused and lethargic after third seizure. Dr. Elgie Congo notified who notified neurologist. Order for $RemoveB'2mg'ZbyYDRAk$  Ativan given and administered. Neurologist at bedside for assessement  EEG tech at bedside to begin EEG.   2102: patient became unresponsive, fluttering eye activity noted and tonic clonic seizure again  lasting approximately 30 seconds. Ativan $RemoveBeforeD'2mg'faSirdWyEZuhxc$  given, Dr. Elgie Congo notified and neurologist notified.   2115: another small seizure noted by EEG, minimal change in patient behavior.   Will admit to [facility] for further management of seizures with neurology.   Assessment and Plan   1. Seizure disorder during pregnancy in third trimester ([place])   2. [redacted] weeks gestation of pregnancy    -Admit to [place] turned over to MD   [place] 11/21/2021, 7:30 PM

## 2021-11-21 NOTE — MAU Note (Signed)
Pt reporting seeing blue and purple spots.  EEG tech in

## 2021-11-21 NOTE — MAU Note (Addendum)
Pt staring, unresponsive with chewing motion x 60 sec-pt initially confused when waking up Within 2-3 minutes pt was talking and coherent. Pt stated that her seizure activity has increased progressively each month and feels that the Keppra has become ineffective. Hot packs applied to R shoulder and neck. Tongue ring removed per pts fiancee' and placed in his pocket.

## 2021-11-21 NOTE — Progress Notes (Signed)
LTM EEG hooked up and running - no initial skin breakdown - push button tested - neuro notified.  

## 2021-11-21 NOTE — MAU Note (Incomplete)
Pt complaint of headache. Rapid eye movement

## 2021-11-21 NOTE — MAU Note (Addendum)
Security called MAU stating there was an emergency out front. This RN, Agustin Cree, RN, Quincy Carnes, CNM, and Camelia Eng, CNM en route to front.   Patient's friend stated patient is [redacted] weeks pregnant and had had two tonic clonic seizures on the way to the hospital. Patient incontinent of bladder. [facility] called and [facility] to front with MAU team.  Patient brought to MAU room 121. FHR found to be in 170's. Patient unable to respond to questions.   Dr. Donavan Foil at bedside at 1930. 18 gauge IV started by Agustin Cree, RN, at [phone]. LR bolus administered.

## 2021-11-21 NOTE — Progress Notes (Signed)
EEG complete - results pending 

## 2021-11-21 NOTE — MAU Note (Signed)
2102   30sec  Seizure-pt began with fixed eyes and unresponsive to questions or name-rapid eye movement with progression to tonic clonic movements.Cleone Slim CNM and Dr.Bass at bedside.

## 2021-11-21 NOTE — MAU Note (Signed)
Patient began tonic clonic seizure at 1941 that lasted 15 seconds. Cleone Slim, CNM at bedside with RN. Patient turned left lateral.

## 2021-11-21 NOTE — MAU Note (Addendum)
Patient stated her head hurt and she was feeling dizzy. Patients eyes began turning side to side and patient began seizing at 2002. Tonic clonic. Eyes rolled back into head. Last 15 seconds. Dr. Donavan Foil, MD and Dr. Otelia Limes, MD notified by Ginnie Smart, RN in department.   MD's immediately at bedside. Dr. Otelia Limes ordered 2 mg of Ativan STAT and another 1500 mg bag of Keppra STAT.

## 2021-11-21 NOTE — MAU Note (Signed)
Pt more alert-answering questions for admission intake

## 2021-11-21 NOTE — MAU Note (Signed)
Dr.Lindzen returned for neurologic exam-pt responsive-with slow reaction and response

## 2021-11-22 ENCOUNTER — Encounter (HOSPITAL_COMMUNITY): Payer: Self-pay | Admitting: Obstetrics and Gynecology

## 2021-11-22 ENCOUNTER — Other Ambulatory Visit: Payer: Self-pay

## 2021-11-22 DIAGNOSIS — G40909 Epilepsy, unspecified, not intractable, without status epilepticus: Secondary | ICD-10-CM

## 2021-11-22 DIAGNOSIS — R569 Unspecified convulsions: Secondary | ICD-10-CM

## 2021-11-22 DIAGNOSIS — O99353 Diseases of the nervous system complicating pregnancy, third trimester: Secondary | ICD-10-CM | POA: Diagnosis not present

## 2021-11-22 DIAGNOSIS — Z3A27 27 weeks gestation of pregnancy: Secondary | ICD-10-CM | POA: Diagnosis not present

## 2021-11-22 LAB — RAPID URINE DRUG SCREEN, HOSP PERFORMED
Amphetamines: NOT DETECTED
Barbiturates: NOT DETECTED
Benzodiazepines: POSITIVE — AB
Cocaine: NOT DETECTED
Opiates: NOT DETECTED
Tetrahydrocannabinol: NOT DETECTED

## 2021-11-22 LAB — PROTEIN / CREATININE RATIO, URINE
Creatinine, Urine: 90.28 mg/dL
Protein Creatinine Ratio: 0.12 mg/mg{Cre} (ref 0.00–0.15)
Total Protein, Urine: 11 mg/dL

## 2021-11-22 MED ORDER — MAGNESIUM OXIDE -MG SUPPLEMENT 400 (240 MG) MG PO TABS
400.0000 mg | ORAL_TABLET | Freq: Two times a day (BID) | ORAL | Status: DC
  Administered 2021-11-22: 400 mg via ORAL
  Filled 2021-11-22: qty 1

## 2021-11-22 MED ORDER — MAGNESIUM OXIDE -MG SUPPLEMENT 400 (240 MG) MG PO TABS: 400.0000 mg | ORAL_TABLET | Freq: Two times a day (BID) | ORAL | 4 refills | Status: DC

## 2021-11-22 MED ORDER — LEVETIRACETAM 750 MG PO TABS: 1500.0000 mg | ORAL_TABLET | Freq: Two times a day (BID) | ORAL | 4 refills | Status: DC

## 2021-11-22 MED ORDER — LEVETIRACETAM 750 MG PO TABS
1500.0000 mg | ORAL_TABLET | Freq: Two times a day (BID) | ORAL | Status: DC
  Administered 2021-11-22: 1500 mg via ORAL
  Filled 2021-11-22: qty 2

## 2021-11-22 NOTE — Discharge Summary (Signed)
Physician Discharge Summary  Patient ID: Erin Krueger MRN: [id number] DOB/AGE: 1995/12/26 25 y.o.  Admit date: 11/21/2021 Discharge date: 11/22/2021  Admission Diagnoses: Seizure, Intrauterine pregnancy @ 27wk  Discharge Diagnoses:  Active Problems:   Seizure [facility])   Supervision of high risk pregnancy, antepartum   Discharged Condition: stable  Hospital Course: 25yo [id number] @ [redacted]w[redacted]d presented due to seizures.  Pt was treated with IV Keppra and Ativan.  Neurology was consulted.  EEG showed no abnormalities.  Keppra medication was increased from 1000mg  bid to 1500mg  bid with the addition of magnesium twice daily.  Pt did well overnight and neurology recommended discharge home with outpatient follow up.  Consults: neurology  Significant Diagnostic Studies: labs:  Results for orders placed or performed during the hospital encounter of 11/21/21 (from the past 24 hour(s))  CBC     Status: Abnormal   Collection Time: 11/21/21  7:20 PM  Result Value Ref Range   WBC 15.7 (H) 4.0 - 10.5 K/uL   RBC 3.58 (L) 3.87 - 5.11 MIL/uL   Hemoglobin 11.3 (L) 12.0 - 15.0 g/dL   HCT 11/23/21 (L) 11/23/21 - 32.3 %   MCV 91.9 80.0 - 100.0 fL   MCH 31.6 26.0 - 34.0 pg   MCHC 34.3 30.0 - 36.0 g/dL   RDW 55.7 32.2 - 02.5 %   Platelets 240 150 - 400 K/uL   nRBC 0.0 0.0 - 0.2 %  Comprehensive metabolic panel     Status: Abnormal   Collection Time: 11/21/21  7:20 PM  Result Value Ref Range   Sodium 133 (L) 135 - 145 mmol/L   Potassium 3.6 3.5 - 5.1 mmol/L   Chloride 105 98 - 111 mmol/L   CO2 19 (L) 22 - 32 mmol/L   Glucose, Bld 114 (H) 70 - 99 mg/dL   BUN 8 6 - 20 mg/dL   Creatinine, Ser 06.2 0.44 - 1.00 mg/dL   Calcium 8.5 (L) 8.9 - 10.3 mg/dL   Total Protein 6.2 (L) 6.5 - 8.1 g/dL   Albumin 2.7 (L) 3.5 - 5.0 g/dL   AST 13 (L) 15 - 41 U/L   ALT 12 0 - 44 U/L   Alkaline Phosphatase 79 38 - 126 U/L   Total Bilirubin <0.1 (L) 0.3 - 1.2 mg/dL   GFR, Estimated 11/23/21 3.76 mL/min   Anion gap 9 5 - 15  Resp  Panel by RT-PCR (Flu A&B, Covid) Nasopharyngeal Swab     Status: None   Collection Time: 11/21/21  7:40 PM   Specimen: Nasopharyngeal Swab; Nasopharyngeal(NP) swabs in vial transport medium  Result Value Ref Range   SARS Coronavirus 2 by RT PCR NEGATIVE NEGATIVE   Influenza A by PCR NEGATIVE NEGATIVE   Influenza B by PCR NEGATIVE NEGATIVE  Type and screen [facility]     Status: None   Collection Time: 11/21/21  9:32 PM  Result Value Ref Range   ABO/RH(D) O POS    Antibody Screen NEG    Sample Expiration      11/24/2021,2359 Performed at [facility], [address] [place], [address] [place]   Magnesium     Status: None   Collection Time: 11/21/21  9:45 PM  Result Value Ref Range   Magnesium 1.7 1.7 - 2.4 mg/dL  Phosphorus     Status: None   Collection Time: 11/21/21  9:45 PM  Result Value Ref Range   Phosphorus 3.8 2.5 - 4.6 mg/dL  Protein / creatinine  ratio, urine     Status: None   Collection Time: 11/22/21  5:47 AM  Result Value Ref Range   Creatinine, Urine 90.28 mg/dL   Total Protein, Urine 11 mg/dL   Protein Creatinine Ratio 0.12 0.00 - 0.15 mg/mg[Cre]  Urine rapid drug screen (hosp performed)     Status: Abnormal   Collection Time: 11/22/21  5:47 AM  Result Value Ref Range   Opiates NONE DETECTED NONE DETECTED   Cocaine NONE DETECTED NONE DETECTED   Benzodiazepines POSITIVE (A) NONE DETECTED   Amphetamines NONE DETECTED NONE DETECTED   Tetrahydrocannabinol NONE DETECTED NONE DETECTED   Barbiturates NONE DETECTED NONE DETECTED    and EEG  Treatments: IV hydration and IV Keppra  Discharge Exam: Blood pressure 111/86, pulse (!) 105, temperature 98.3 F (36.8 C), temperature source Oral, resp. rate 19, SpO2 97 %, unknown if currently breastfeeding. Physical Examination:  General appearance - alert, well appearing, and in no distress Mental status - normal mood and behavior Chest - clear to auscultation, no wheezes, rales or rhonchi,  symmetric air entry Heart - normal rate and regular rhythm Abdomen - gravid, soft and non-tender Extremities - no edema, no calf tenderness Skin - warm and dry    Disposition: Discharge disposition: 01-Home or Self Care       Discharge Instructions     Discharge patient   Complete by: As directed    Discharge disposition: 01-Home or Self Care   Discharge patient date: 11/22/2021      Allergies as of 11/22/2021       Reactions   Bee Venom Anaphylaxis   Penicillins Anaphylaxis   * Has tolerated several cephalosporins* Has patient had a PCN reaction causing immediate rash, facial/tongue/throat swelling, SOB or lightheadedness with hypotension: Unknown Has patient had a PCN reaction causing severe rash involving mucus membranes or skin necrosis: Unknown Has patient had a PCN reaction that required hospitalization: Unknown Has patient had a PCN reaction occurring within the last 10 years: Unknown If all of the above answers are "NO", then may proceed with Cephalosporin use.   Ativan [lorazepam]    Aggressive-agitation   Prednisone Other (See Comments)   Bleeding nose bleeding and internal bleeding   Latex Rash        Medication List     TAKE these medications    aspirin 81 MG EC tablet Take 2 tablets (162 mg total) by mouth daily. Swallow whole.   levETIRAcetam 750 MG tablet Commonly known as: KEPPRA Take 2 tablets (1,500 mg total) by mouth 2 (two) times daily. What changed:  medication strength how much to take   magnesium oxide 400 (240 Mg) MG tablet Commonly known as: MAG-OX Take 1 tablet (400 mg total) by mouth 2 (two) times daily.   nitrofurantoin (macrocrystal-monohydrate) 100 MG capsule Commonly known as: Macrobid Take 1 capsule (100 mg total) by mouth at bedtime for 90 doses.   PRENATAL VITAMIN PO Take by mouth.         Signed: Sharon Seller 11/22/2021, 1:46 PM

## 2021-11-22 NOTE — Progress Notes (Signed)
FACULTY PRACTICE ANTEPARTUM(COMPREHENSIVE) NOTE  Erin Krueger is a 25 y.o. [phone] with Estimated Date of Delivery: 02/20/22   By  early ultrasound [redacted]w[redacted]d  who is admitted for seizure activity.    Fetal presentation is cephalic. Length of Stay:  0  Days  Date of admission:11/21/2021  Subjective: Pt resting comfortably this am, reports no issues/seizures overnight. Patient reports the fetal movement as active. Patient reports uterine contraction  activity as none. Patient reports  vaginal bleeding as none. Patient describes fluid per vagina as None.  Vitals:  Blood pressure 97/71, pulse (!) 107, temperature 98.3 F (36.8 C), temperature source Oral, resp. rate 15, SpO2 97 %, unknown if currently breastfeeding. Vitals:   11/22/21 1045 11/22/21 1050 11/22/21 1055 11/22/21 1100  BP:      Pulse:      Resp:      Temp:      TempSrc:      SpO2: 97% 97% 97% 97%   Physical Examination:  General appearance - alert, well appearing, and in no distress Mental status - normal mood and behavior Chest - clear to auscultation, no wheezes, rales or rhonchi, symmetric air entry Heart - normal rate and regular rhythm Abdomen - gravid, soft and non-tender Extremities - no edema, no calf tenderness Skin - warm and dry  Fetal Monitoring:  Baseline: 150 bpm, Variability: moderate variability, Accelerations: 10x10 accels present x 2, and Decelerations: Absent   reactive  Labs:  Results for orders placed or performed during the hospital encounter of 11/21/21 (from the past 24 hour(s))  CBC   Collection Time: 11/21/21  7:20 PM  Result Value Ref Range   WBC 15.7 (H) 4.0 - 10.5 K/uL   RBC 3.58 (L) 3.87 - 5.11 MIL/uL   Hemoglobin 11.3 (L) 12.0 - 15.0 g/dL   HCT 58.5 (L) 27.7 - 82.4 %   MCV 91.9 80.0 - 100.0 fL   MCH 31.6 26.0 - 34.0 pg   MCHC 34.3 30.0 - 36.0 g/dL   RDW 23.5 36.1 - 44.3 %   Platelets 240 150 - 400 K/uL   nRBC 0.0 0.0 - 0.2 %  Comprehensive metabolic panel   Collection Time:  11/21/21  7:20 PM  Result Value Ref Range   Sodium 133 (L) 135 - 145 mmol/L   Potassium 3.6 3.5 - 5.1 mmol/L   Chloride 105 98 - 111 mmol/L   CO2 19 (L) 22 - 32 mmol/L   Glucose, Bld 114 (H) 70 - 99 mg/dL   BUN 8 6 - 20 mg/dL   Creatinine, Ser 1.54 0.44 - 1.00 mg/dL   Calcium 8.5 (L) 8.9 - 10.3 mg/dL   Total Protein 6.2 (L) 6.5 - 8.1 g/dL   Albumin 2.7 (L) 3.5 - 5.0 g/dL   AST 13 (L) 15 - 41 U/L   ALT 12 0 - 44 U/L   Alkaline Phosphatase 79 38 - 126 U/L   Total Bilirubin <0.1 (L) 0.3 - 1.2 mg/dL   GFR, Estimated >00 >86 mL/min   Anion gap 9 5 - 15  Resp Panel by RT-PCR (Flu A&B, Covid) Nasopharyngeal Swab   Collection Time: 11/21/21  7:40 PM   Specimen: Nasopharyngeal Swab; Nasopharyngeal(NP) swabs in vial transport medium  Result Value Ref Range   SARS Coronavirus 2 by RT PCR NEGATIVE NEGATIVE   Influenza A by PCR NEGATIVE NEGATIVE   Influenza B by PCR NEGATIVE NEGATIVE  Type and screen [facility]   Collection Time: 11/21/21  9:32 PM  Result Value Ref Range   ABO/RH(D) O POS    Antibody Screen NEG    Sample Expiration      11/24/2021,2359 Performed at [facility], [address], [place], [place] [address]   Magnesium   Collection Time: 11/21/21  9:45 PM  Result Value Ref Range   Magnesium 1.7 1.7 - 2.4 mg/dL  Phosphorus   Collection Time: 11/21/21  9:45 PM  Result Value Ref Range   Phosphorus 3.8 2.5 - 4.6 mg/dL  Protein / creatinine ratio, urine   Collection Time: 11/22/21  5:47 AM  Result Value Ref Range   Creatinine, Urine 90.28 mg/dL   Total Protein, Urine 11 mg/dL   Protein Creatinine Ratio 0.12 0.00 - 0.15 mg/mg[Cre]  Urine rapid drug screen (hosp performed)   Collection Time: 11/22/21  5:47 AM  Result Value Ref Range   Opiates NONE DETECTED NONE DETECTED   Cocaine NONE DETECTED NONE DETECTED   Benzodiazepines POSITIVE (A) NONE DETECTED   Amphetamines NONE DETECTED NONE DETECTED   Tetrahydrocannabinol NONE DETECTED NONE DETECTED    Barbiturates NONE DETECTED NONE DETECTED    Imaging Studies:    DG Shoulder Right Portable  Result Date: 11/21/2021 CLINICAL DATA:  Fall.  Right shoulder pain. EXAM: PORTABLE RIGHT SHOULDER COMPARISON:  None. FINDINGS: Single view of the right shoulder shows no evidence of a fracture. Glenohumeral and AC joints appear normally spaced and aligned. Surrounding soft tissues are unremarkable. IMPRESSION: Negative. Electronically Signed   By: Amie Portland M.D.   On: 11/21/2021 19:53   Overnight EEG with video  Result Date: 11/22/2021 Charlsie Quest, MD     11/22/2021  9:04 AM Patient Name: Erin Krueger MRN: [id number] Epilepsy Attending: Charlsie Quest Referring Physician/Provider: Dr Caryl Pina Duration: 11/21/2021 2137 to 11/22/2021 0900  Patient history: 25 year old female, [redacted] weeks pregnant, with a history of epilepsy since childhood, who presents with breakthrough seizures. EEG to evaluate for seizure  Level of alertness: Awake, asleep  AEDs during EEG study: LEV, Ativan  Technical aspects: This EEG study was done with scalp electrodes positioned according to the 10-20 International system of electrode placement. Electrical activity was acquired at a sampling rate of 500Hz  and reviewed with a high frequency filter of 70Hz  and a low frequency filter of 1Hz . EEG data were recorded continuously and digitally stored.  Description: No clear posterior dominant rhythm was seen. Sleep was characterized by vertex waves, sleep spindles (12 to 14 Hz), maximal frontocentral region.  EEG showed an excessive amount of 15 to 18 Hz,  beta activity distributed symmetrically and diffusely. Hyperventilation and photic stimulation were not performed.    ABNORMALITY - Excessive beta, generalized  IMPRESSION: This study is within normal limits. No seizures or epileptiform discharges were seen throughout the recording.  The excessive beta activity seen in the background is most likely due to the effect of  benzodiazepine and is a benign EEG pattern. Priyanka     Medications:  Scheduled  aspirin EC  162 mg Oral Daily   docusate sodium  100 mg Oral Daily   levETIRAcetam  1,500 mg Oral BID   magnesium oxide  400 mg Oral BID   prenatal multivitamin  1 tablet Oral Q1200   I have reviewed the patient's current medications.  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  Jemia Fata 11/22/2021,11:11 AM

## 2021-11-22 NOTE — Plan of Care (Signed)

## 2021-11-22 NOTE — Procedures (Addendum)
Patient Name: Erin Krueger  MRN: [id number]  Epilepsy Attending: Charlsie Quest  Referring Physician/Provider: Dr Caryl Pina Duration: 11/21/2021 2137 to 11/22/2021 1227   Patient history: 25 year old female, [redacted] weeks pregnant, with a history of epilepsy since childhood, who presents with breakthrough seizures. EEG to evaluate for seizure   Level of alertness: Awake, asleep   AEDs during EEG study: LEV   Technical aspects: This EEG study was done with scalp electrodes positioned according to the 10-20 International system of electrode placement. Electrical activity was acquired at a sampling rate of 500Hz  and reviewed with a high frequency filter of 70Hz  and a low frequency filter of 1Hz . EEG data were recorded continuously and digitally stored.    Description: No clear posterior dominant rhythm was seen. Sleep was characterized by vertex waves, sleep spindles (12 to 14 Hz), maximal frontocentral region.  EEG showed an excessive amount of 15 to 18 Hz,  beta activity distributed symmetrically and diffusely. Hyperventilation and photic stimulation were not performed.      ABNORMALITY - Excessive beta, generalized   IMPRESSION: This study is within normal limits. No seizures or epileptiform discharges were seen throughout the recording.   The excessive beta activity seen in the background is most likely due to the effect of benzodiazepine and is a benign EEG pattern.   Erin Krueger 

## 2021-11-22 NOTE — Progress Notes (Signed)
Patient and significant other arguing @ bedside.  Patient asked for discharge papers and refused fetal monitoring after this RN discussed need for monitoring.  Patient stated " She(baby) is moving everywhere.I need to go, I need fresh air."

## 2021-11-22 NOTE — Progress Notes (Signed)
EEG discontinued at bedside. No skin breakdown noted. Results pending. °

## 2021-11-22 NOTE — Procedures (Signed)
Patient Name: Erin Krueger  MRN: [id number]  Epilepsy Attending: Charlsie Quest  Referring Physician/Provider: Dr Caryl Pina Date: 11/21/2021 Duration: 25.31 mins  Patient history: 25 year old female, [redacted] weeks pregnant, with a history of epilepsy since childhood, who presents with breakthrough seizures. EEG to evaluate for seizure  Level of alertness: Awake  AEDs during EEG study: LEV, Ativan  Technical aspects: This EEG study was done with scalp electrodes positioned according to the 10-20 International system of electrode placement. Electrical activity was acquired at a sampling rate of 500Hz  and reviewed with a high frequency filter of 70Hz  and a low frequency filter of 1Hz . EEG data were recorded continuously and digitally stored.   Description: EEG showed an excessive amount of 15 to 18 Hz,  beta activity distributed symmetrically and diffusely. Hyperventilation and photic stimulation were not performed.     ABNORMALITY - Excessive beta, generalized  IMPRESSION: This study is within normal limits. No seizures or epileptiform discharges were seen throughout the recording.  The excessive beta activity seen in the background is most likely due to the effect of benzodiazepine and is a benign EEG pattern.   Erin Krueger 

## 2021-11-22 NOTE — Progress Notes (Addendum)
Neurology Progress Note Erin Krueger MR# [id number] 11/22/2021   S: EEG this morning completed and normal.   O: Current vital signs: BP 103/60 (BP Location: Right Arm)    Pulse (!) 114    Temp 97.7 F (36.5 C) (Axillary)    Resp 16    LMP  (LMP Unknown)    SpO2 98%  Vital signs in last 24 hours: Temp:  [97.7 F (36.5 C)] 97.7 F (36.5 C) (12/30 1929) Pulse Rate:  [91-142] 114 (12/31 0524) Resp:  [16-19] 16 (12/31 0524) BP: (81-156)/(52-98) 103/60 (12/31 0524) SpO2:  [95 %-99 %] 98 % (12/31 0524)  Medications  Current Facility-Administered Medications:    acetaminophen (TYLENOL) tablet 650 mg, 650 mg, Oral, Q4H PRN, Myna Hidalgo, DO   aspirin EC tablet 162 mg, 162 mg, Oral, Daily, Warden Fillers, MD   calcium carbonate (TUMS - dosed in mg elemental calcium) chewable tablet 400 mg of elemental calcium, 2 tablet, Oral, Q4H PRN, Ozan, Jennifer, DO   docusate sodium (COLACE) capsule 100 mg, 100 mg, Oral, Daily, Ozan, Jennifer, DO   lactated ringers infusion, , Intravenous, Continuous, Myna Hidalgo, DO, Last Rate: 125 mL/hr at 11/22/21 0547, New Bag at 11/22/21 0547   levETIRAcetam (KEPPRA) tablet 1,000 mg, 1,000 mg, Oral, BID, Warden Fillers, MD   prenatal multivitamin tablet 1 tablet, 1 tablet, Oral, Q1200, Myna Hidalgo, DO Labs     Component Value Date/Time   WBC 15.7 (H) 11/21/2021 1920   RBC 3.58 (L) 11/21/2021 1920   HGB 11.3 (L) 11/21/2021 1920   HGB 12.9 08/12/2021 1654   HCT 32.9 (L) 11/21/2021 1920   HCT 37.2 08/12/2021 1654   PLT 240 11/21/2021 1920   PLT 227 08/12/2021 1654   MCV 91.9 11/21/2021 1920   MCV 89 08/12/2021 1654   MCH 31.6 11/21/2021 1920   MCHC 34.3 11/21/2021 1920   RDW 11.9 11/21/2021 1920   RDW 12.7 08/12/2021 1654   LYMPHSABS 2.7 08/12/2021 1654   MONOABS 0.4 12/15/2020 2044   EOSABS 0.1 08/12/2021 1654   BASOSABS 0.0 08/12/2021 1654       Component Value Date/Time   NA 133 (L) 11/21/2021 1920   NA 136 08/12/2021 1654    K 3.6 11/21/2021 1920   CL 105 11/21/2021 1920   CO2 19 (L) 11/21/2021 1920   GLUCOSE 114 (H) 11/21/2021 1920   BUN 8 11/21/2021 1920   BUN 8 08/12/2021 1654   CREATININE 0.62 11/21/2021 1920   CALCIUM 8.5 (L) 11/21/2021 1920   PROT 6.2 (L) 11/21/2021 1920   PROT 6.8 08/12/2021 1654   ALBUMIN 2.7 (L) 11/21/2021 1920   ALBUMIN 4.6 08/12/2021 1654   AST 13 (L) 11/21/2021 1920   ALT 12 11/21/2021 1920   ALKPHOS 79 11/21/2021 1920   BILITOT <0.1 (L) 11/21/2021 1920   BILITOT 0.2 08/12/2021 1654   GFRNONAA >60 11/21/2021 1920   GFRAA >60 06/24/2020 1846       Component Value Date/Time   CHOL 101 08/09/2013 0620   TRIG 121 08/09/2013 0620   HDL 55 08/09/2013 0620   CHOLHDL 1.8 08/09/2013 0620   VLDL 24 08/09/2013 0620   LDLCALC 22 08/09/2013 0620   Assessment: 25 year old female, [redacted] weeks pregnant, with a history of epilepsy since childhood, who presents with breakthrough seizures. EEG this morning is within normal limits. No seizures or epileptiform discharges were seen throughout the recording. However since she has had breakthrough seizure will continue LEV at higher dose.  Recommendations: Continue keppra 1500 mg two times daily. Start magnesium 400-800mg  two times daily as tolerated. Please call for questions.  Electronically signed by:  Marisue Humble, MD Page: [phone] 11/22/2021, 8:18 AM

## 2021-11-22 NOTE — Progress Notes (Signed)
Patient moved from ER to floor, EEG machine running , study uploaded. [facility] monitored, Event button test confirmed by [facility].

## 2021-11-23 NOTE — L&D Delivery Note (Addendum)
OB/GYN Faculty Practice Delivery Note ? ?Erin Krueger is a 26 y.o. [phone] s/p SVD at [redacted]w[redacted]d. She was admitted for IOL d/t gHTN, fetal tachycardia and hx of IUFD.  ? ?ROM: 14h 46m with clear fluid ?GBS Status: unknown tx'd with ancef ?Maximum Maternal Temperature: 98.3 ? ?Labor Progress: ?Pt presented for IOL, s/p FB, Cytotec, pitocin and AROM. She seizure like activity documented on 01/24/2022.  Otherwise, pt progressed to completed. ? ?Delivery Date/Time: 01/24/2022 @ 1627 ?Delivery: Called to room and patient was complete and pushing. Head delivered LOA. No nuchal cord present. Shoulder and body delivered in usual fashion. Infant with spontaneous cry, placed on mother's abdomen, dried and stimulated. Cord clamped x 2 after 1-minute delay, and cut by FOB. Cord blood drawn. Placenta delivered spontaneously with gentle cord traction. Fundus firm with massage and Pitocin. Labia, perineum, vagina, and cervix inspected inspected with no lacerations.  ? ?Placenta: Intact, 3 VC and to L&D ?Complications: None ?Lacerations: None ?EBL: 0 ?Analgesia: Epidural ? ?Postpartum Planning ?[ ]  message to sent to schedule follow-up  ?[ ]  vaccines UTD ? ?Infant: Female  APGARs 9/9  weight pending ? ? ?[place], Student-MidWife ? ?I was present and gloved with the student for the entire delivery I agree with the note above.  ? ?After the delivery a post placental liletta was placed.  ? ? ?Post-Placental IUD Insertion Procedure Note ? ?Patient identified, informed consent signed prior to delivery, signed copy in chart, time out was performed.   ? ?Vaginal, labial and perineal areas thoroughly inspected for lacerations. No laceration identified. Liletta ? ? ?- IUD inserted with inserter per manufacturer's instructions.  ?  ?Strings trimmed to the level of the introitus. Patient tolerated procedure well. ? ? ?Patient given post procedure instructions and IUD care card with expiration date.  Patient is asked to keep IUD strings  tucked in her vagina until her postpartum follow up visit in 4-6 weeks. Patient advised to abstain from sexual intercourse and pulling on strings before her follow-up visit. Patient verbalized an understanding of the plan of care and agrees.   ? ?Marcille Buffy DNP, CNM  ?01/24/22  5:27 PM  ? ? ? ? ?  ?

## 2021-11-28 ENCOUNTER — Other Ambulatory Visit: Payer: Self-pay

## 2021-11-28 DIAGNOSIS — O0990 Supervision of high risk pregnancy, unspecified, unspecified trimester: Secondary | ICD-10-CM

## 2021-12-01 ENCOUNTER — Other Ambulatory Visit: Payer: Self-pay

## 2021-12-01 ENCOUNTER — Other Ambulatory Visit: Payer: Medicaid Other

## 2021-12-01 ENCOUNTER — Ambulatory Visit (INDEPENDENT_AMBULATORY_CARE_PROVIDER_SITE_OTHER): Payer: Medicaid Other | Admitting: Obstetrics and Gynecology

## 2021-12-01 ENCOUNTER — Other Ambulatory Visit: Payer: Self-pay | Admitting: *Deleted

## 2021-12-01 ENCOUNTER — Ambulatory Visit: Payer: Medicaid Other

## 2021-12-01 ENCOUNTER — Ambulatory Visit (HOSPITAL_BASED_OUTPATIENT_CLINIC_OR_DEPARTMENT_OTHER): Payer: Medicaid Other

## 2021-12-01 ENCOUNTER — Ambulatory Visit: Payer: Medicaid Other | Attending: Obstetrics | Admitting: *Deleted

## 2021-12-01 ENCOUNTER — Encounter: Payer: Self-pay | Admitting: Obstetrics and Gynecology

## 2021-12-01 VITALS — BP 136/77 | HR 101

## 2021-12-01 VITALS — BP 139/83 | HR 89 | Wt 211.6 lb

## 2021-12-01 DIAGNOSIS — O09299 Supervision of pregnancy with other poor reproductive or obstetric history, unspecified trimester: Secondary | ICD-10-CM

## 2021-12-01 DIAGNOSIS — O133 Gestational [pregnancy-induced] hypertension without significant proteinuria, third trimester: Secondary | ICD-10-CM

## 2021-12-01 DIAGNOSIS — O99353 Diseases of the nervous system complicating pregnancy, third trimester: Secondary | ICD-10-CM | POA: Diagnosis not present

## 2021-12-01 DIAGNOSIS — O09293 Supervision of pregnancy with other poor reproductive or obstetric history, third trimester: Secondary | ICD-10-CM | POA: Insufficient documentation

## 2021-12-01 DIAGNOSIS — O139 Gestational [pregnancy-induced] hypertension without significant proteinuria, unspecified trimester: Secondary | ICD-10-CM | POA: Insufficient documentation

## 2021-12-01 DIAGNOSIS — Z8759 Personal history of other complications of pregnancy, childbirth and the puerperium: Secondary | ICD-10-CM | POA: Diagnosis not present

## 2021-12-01 DIAGNOSIS — R569 Unspecified convulsions: Secondary | ICD-10-CM

## 2021-12-01 DIAGNOSIS — Z3A28 28 weeks gestation of pregnancy: Secondary | ICD-10-CM

## 2021-12-01 DIAGNOSIS — Z79899 Other long term (current) drug therapy: Secondary | ICD-10-CM | POA: Diagnosis not present

## 2021-12-01 DIAGNOSIS — Z362 Encounter for other antenatal screening follow-up: Secondary | ICD-10-CM | POA: Insufficient documentation

## 2021-12-01 DIAGNOSIS — G40909 Epilepsy, unspecified, not intractable, without status epilepticus: Secondary | ICD-10-CM | POA: Diagnosis not present

## 2021-12-01 DIAGNOSIS — O99350 Diseases of the nervous system complicating pregnancy, unspecified trimester: Secondary | ICD-10-CM

## 2021-12-01 DIAGNOSIS — O09292 Supervision of pregnancy with other poor reproductive or obstetric history, second trimester: Secondary | ICD-10-CM | POA: Diagnosis not present

## 2021-12-01 DIAGNOSIS — O0990 Supervision of high risk pregnancy, unspecified, unspecified trimester: Secondary | ICD-10-CM

## 2021-12-01 DIAGNOSIS — O2343 Unspecified infection of urinary tract in pregnancy, third trimester: Secondary | ICD-10-CM

## 2021-12-01 DIAGNOSIS — O10919 Unspecified pre-existing hypertension complicating pregnancy, unspecified trimester: Secondary | ICD-10-CM | POA: Insufficient documentation

## 2021-12-01 LAB — COMPREHENSIVE METABOLIC PANEL
ALT: 9 IU/L (ref 0–32)
AST: 10 IU/L (ref 0–40)
Albumin/Globulin Ratio: 1.7 (ref 1.2–2.2)
Albumin: 3.7 g/dL — ABNORMAL LOW (ref 3.9–5.0)
Alkaline Phosphatase: 82 IU/L (ref 44–121)
BUN/Creatinine Ratio: 13 (ref 9–23)
BUN: 7 mg/dL (ref 6–20)
Bilirubin Total: <0.2 mg/dL (ref 0.0–1.2)
CO2: 19 mmol/L — ABNORMAL LOW (ref 20–29)
Calcium: 8.5 mg/dL — ABNORMAL LOW (ref 8.7–10.2)
Chloride: 105 mmol/L (ref 96–106)
Creatinine, Ser: 0.55 mg/dL — ABNORMAL LOW (ref 0.57–1.00)
Globulin, Total: 2.2 g/dL (ref 1.5–4.5)
Glucose: 129 mg/dL — ABNORMAL HIGH (ref 70–99)
Potassium: 3.5 mmol/L (ref 3.5–5.2)
Sodium: 139 mmol/L (ref 134–144)
Total Protein: 5.9 g/dL — ABNORMAL LOW (ref 6.0–8.5)
eGFR: 131 mL/min/{1.73_m2} (ref >59)

## 2021-12-01 IMAGING — US US MFM OB FOLLOW-UP
1 series · 14 of 28 positions shown · non-contrast
Comparison: none

[Series 1: us mfm ob follow-up · 46 acquisitions, 14 frames shown]
[im 2/46]
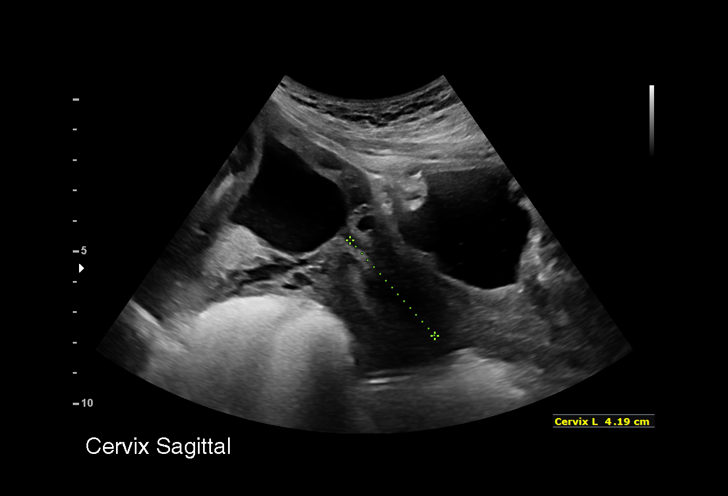
[im 6/46]
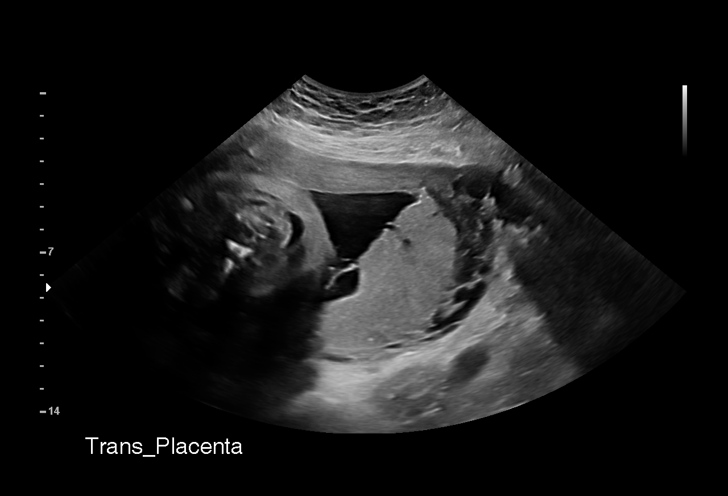
[im 9/46]
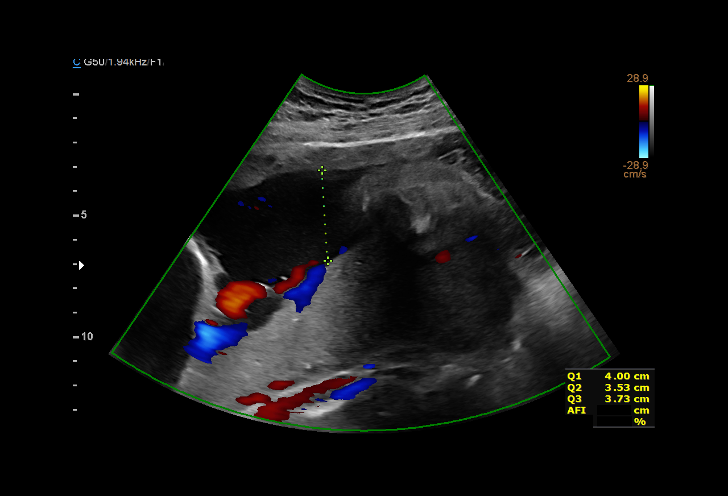
[im 12/46]
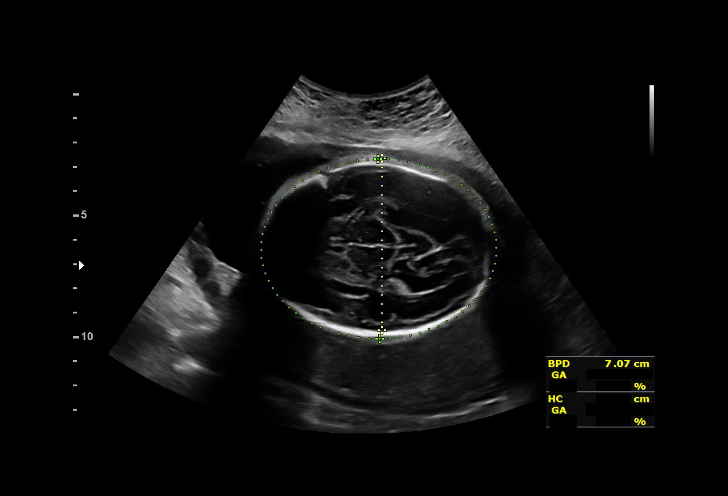
[im 16/46]
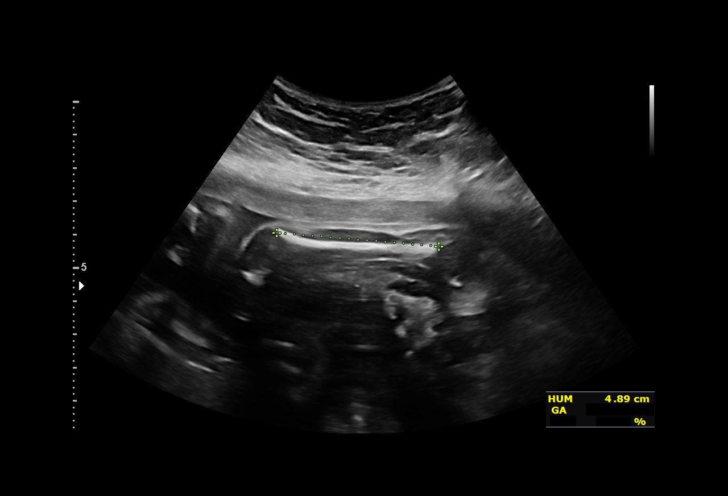
[im 19/46]
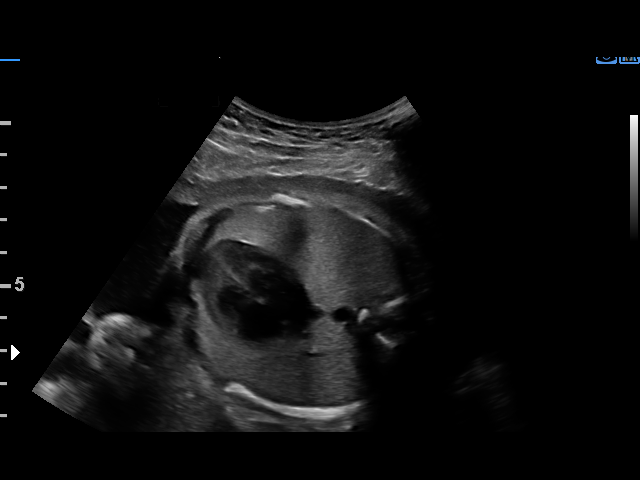
[im 22/46]
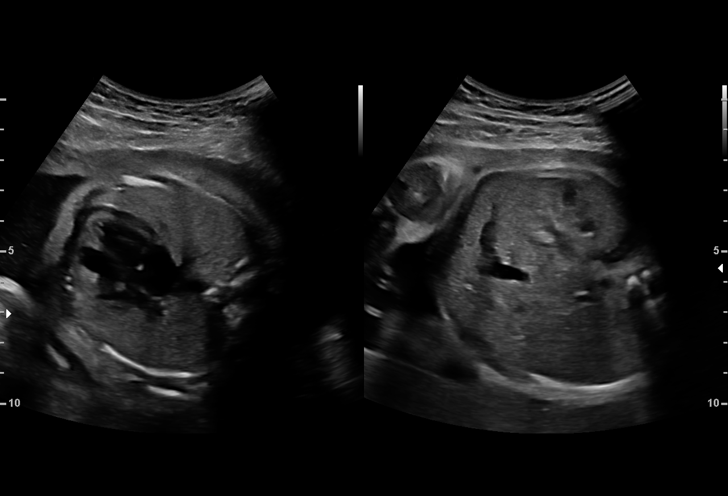
[im 26/46]
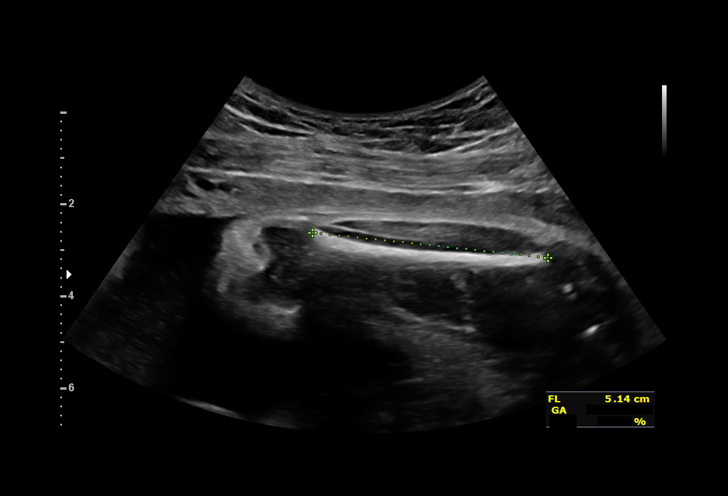
[im 29/46]
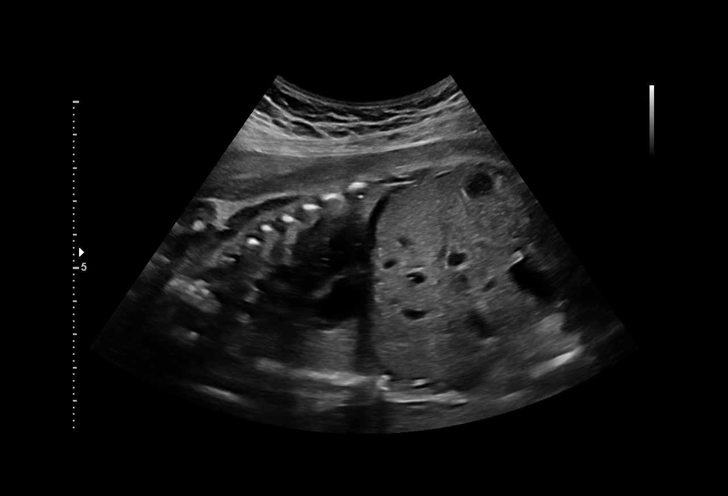
[im 32/46]
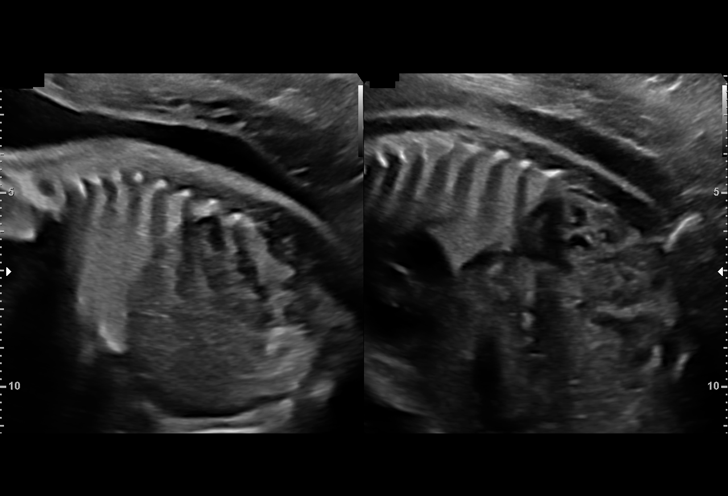
[im 36/46]
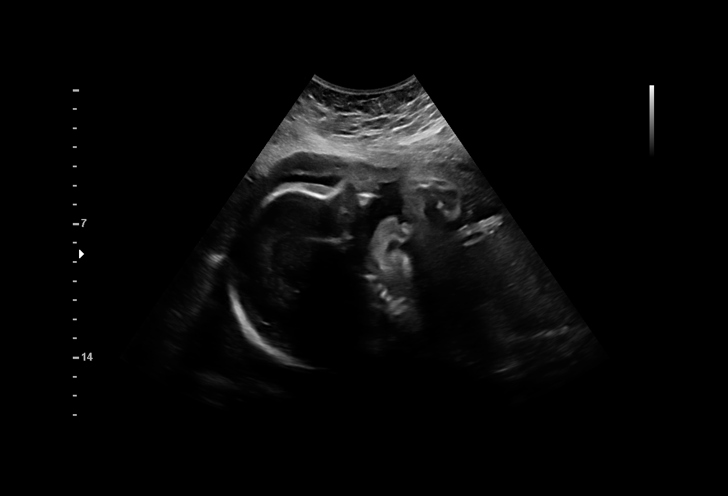
[im 39/46]
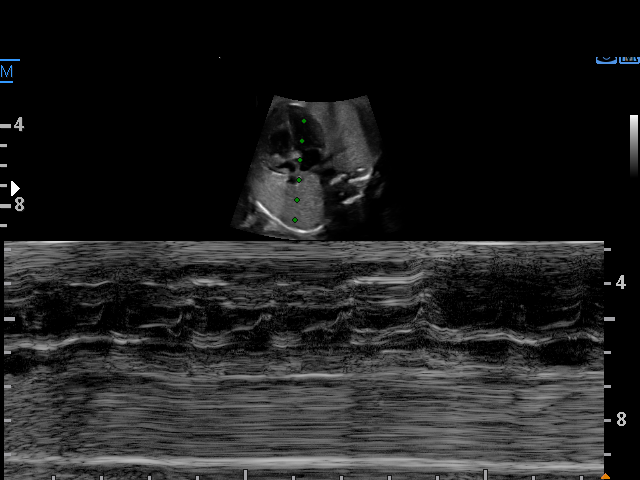
[im 42/46]
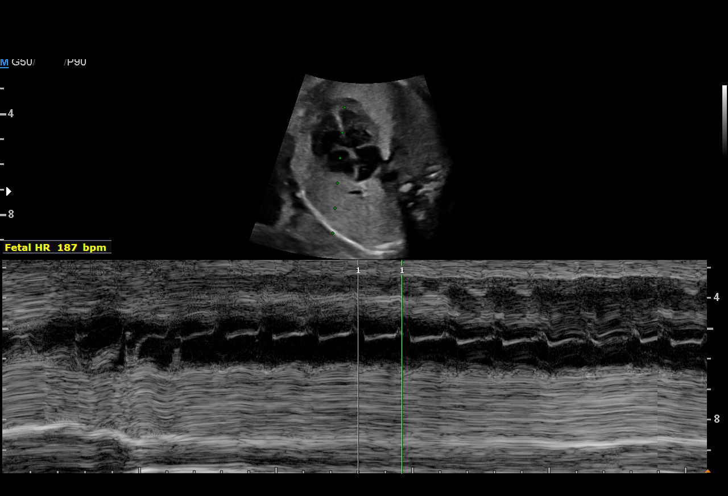
[im 46/46]
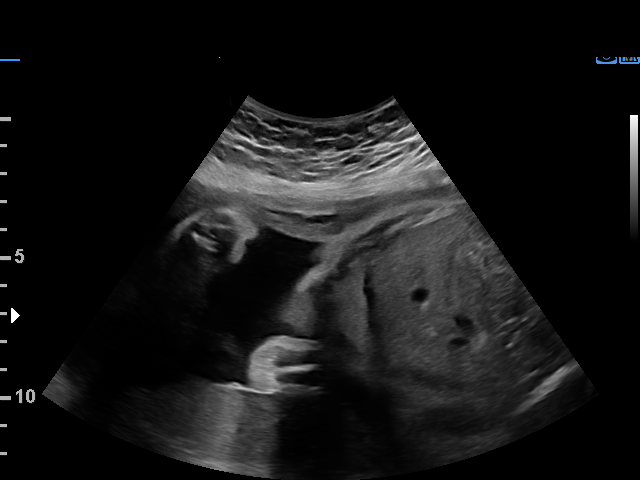

[14 of 28 positions shown; findings below may reference images not displayed]

Indications

 Seizure disorder (Keppra)                      [XX] [XX]
 Prior poor obstetrical history antepartum,     [XX]
 second trimester (term IUFD)
 Previous pregnacy with congenital heart        [XX]
 (cardiac) defect x 2 (no surg)
 Normal Fetal ECHO
 Normal Integrated Screen
 28 weeks gestation of pregnancy
 Encounter for other antenatal screening        [XX]
 follow-up
 Poor obstetric history: Previous preeclampsia  [XX]
Fetal Evaluation

 Num Of Fetuses:         1
 Fetal Heart Rate(bpm):  180
 Cardiac Activity:       Observed
 Presentation:           Breech
 Placenta:               Posterior
 P. Cord Insertion:      Visualized

 Amniotic Fluid
 AFI FV:      Within normal limits

 AFI Sum(cm)     %Tile       Largest Pocket(cm)
 14.3            48          4

 RUQ(cm)       RLQ(cm)       LUQ(cm)        LLQ(cm)
 4
Biometry

 BPD:      71.1  mm     G. Age:  28w 4d         42  %    CI:         69.6   %    70 - 86
                                                         FL/HC:      18.8   %    18.8 -
 HC:       272   mm     G. Age:  29w 5d         57  %    HC/AC:      1.07        1.05 -
 AC:      255.2  mm     G. Age:  29w 5d         80  %    FL/BPD:     71.7   %    71 - 87
 FL:         51  mm     G. Age:  27w 2d         10  %    FL/AC:      20.0   %    20 - 24
 HUM:      49.4  mm     G. Age:  29w 0d         56  %
 LV:        6.4  mm

 Est. FW:    [XX]  gm    2 lb 14 oz      53  %
OB History

 Gravidity:    7         Term:   3        Prem:   1        SAB:   2
 TOP:          0       Ectopic:  0        Living: 3
Gestational Age

 U/S Today:     28w 6d                                        EDD:   [DATE]
 Best:          28w 3d     Det. By:  Early Ultrasound         EDD:   [DATE]
                                     ([DATE])
Anatomy

 Cranium:               Appears normal         LVOT:                   Previously seen
 Cavum:                 Previously seen        Aortic Arch:            Previously seen
 Ventricles:            Appears normal         Ductal Arch:            Previously seen
 Choroid Plexus:        Previously seen        Diaphragm:              Appears normal
 Cerebellum:            Previously seen        Stomach:                Appears normal, left
                                                                       sided
 Posterior Fossa:       Previously seen        Abdomen:                Previously seen
 Nuchal Fold:           Not applicable (>20    Abdominal Wall:         Previously seen
                        wks GA)
 Face:                  Orbits and profile     Cord Vessels:           Previously seen
                        previously seen
 Lips:                  Previously seen        Kidneys:                Appear normal
 Palate:                Previously seen        Bladder:                Appears normal
 Thoracic:              Appears normal         Spine:                  Previously seen
 Heart:                 Appears normal         Upper Extremities:      Previously seen
                        (4CH, axis, and
                        situs)
 RVOT:                  Previously seen        Lower Extremities:      Previously seen

 Other:  VC, 3VV and 3VTV previously visualized. Heels/feet and open
         hands/5th digits previously visualized. Lenses, nasal bone, maxilla
         and mandible previously visualized.
Cervix Uterus Adnexa

 Cervix
 Length:           4.19  cm.
 Normal appearance by transabdominal scan.
Impression

 History of stillbirth followed by 3 term vaginal deliveries and
 these infants are alive and well.  Patient has screening for
 gestational diabetes today.  Blood pressure today at her
 office is 136/77 mmHg.

 Fetal growth is appropriate for gestational age .Amniotic fluid
 is normal and good fetal activity is seen .
Recommendations

 -An appointment was made for her to return in 4 weeks for
 fetal growth assessment and BPP.
 -Weekly BPP from 32 weeks gestation till delivery and this
 may be performed at your office or at our center.
                 [email or web address]

## 2021-12-01 NOTE — Progress Notes (Signed)
PRENATAL VISIT NOTE  Subjective:  Erin Krueger is a 26 y.o. [phone] at [redacted]w[redacted]d being seen today for ongoing prenatal care.  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  Patient reports  occasional headaches .  Contractions: Not present. Vag. Bleeding: None.  Movement: Present. Denies leaking of fluid.   The following portions of the patient's history were reviewed and updated as appropriate: allergies, current medications, past family history, past medical history, past social history, past surgical history and problem list.   Objective:   Vitals:   12/01/21 0832 12/01/21 0859  BP: (!) 144/123 139/83  Pulse: (!) 105 89  Weight: 211 lb 9.6 oz (96 kg)     Fetal Status: Fetal Heart Rate (bpm): 153   Movement: Present     General:  Alert, oriented and cooperative. Patient is in no acute distress.  Skin: Skin is warm and dry. No rash noted.   Cardiovascular: Normal heart rate noted  Respiratory: Normal respiratory effort, no problems with respiration noted  Abdomen: Soft, nttp, gravid, appropriate for gestational age.  Pain/Pressure: Present     Pelvic: Cervical exam deferred        Extremities: Normal range of motion.  Edema: Trace  Mental Status: Normal mood and affect. Normal behavior. Normal judgment and thought content.   Assessment and Plan:  Pregnancy: [id number] at  [redacted]w[redacted]d 1. Gestational hypertension, third trimester I gave her this diagnosis today based on BPs today and in her flowsheet. D/w her recommendation for 37wk IOL, qmonth growth u/s (pt has one today), qwk bpp starting at 32wks and qwk labs starting from here on out.  - Comprehensive metabolic panel - Culture, OB Urine - Protein / creatinine ratio, urine  2. Pseudoseizures ([place]) Pt states her prior neurologist in [place] is no longer in business. She confrims she is on the increased dose of keppra that was started on 12/31 in the hospital (1500 bid); she hasn't picked up her Mg Rx. I told her to try the recommended Mg to help with the occasional headahces. Referral made to [place] neurology - Ambulatory referral to Neurology  3. Seizure disorder during pregnancy, antepartum ([place]) See above - Ambulatory referral to Neurology  4. [redacted] weeks gestation of pregnancy GTT, labs today D/w pt re: BC next visit - Ambulatory referral to Neurology  5. Recurrent urinary tract infection affecting pregnancy in third trimester Pt on macrobid qhs suppression  6. Hx of preeclampsia, prior pregnancy, currently pregnant Pt confirms on low dose asa  Preterm labor symptoms and general obstetric precautions including but not limited to vaginal bleeding, contractions, leaking of fluid and fetal movement were reviewed in detail with the patient. Please refer to After Visit Summary for other counseling recommendations.   Return in about 1 week (around 12/08/2021) for in person, high risk ob, md visit.  Future Appointments  Date Time Provider [place]  12/04/2021 10:45 AM [place] [facility]  12/09/2021  8:20 AM [facility]  12/09/2021  8:55 AM Dione Plover Annice Needy, MD [facility]  12/16/2021  8:15 AM Renard Matter, MD [facility]  12/24/2021  8:55 AM Woodroe Mode, MD [facility]  12/31/2021  8:15 AM Radene Gunning, MD [facility]  01/07/2022  8:15 AM Caren Macadam, MD [facility]  01/14/2022  8:15 AM Radene Gunning, MD [facility]  01/21/2022  8:15 AM Osborne Oman, MD [facility]  01/28/2022  8:15 AM Clarnce Flock, MD [facility]    Aletha Halim, MD

## 2021-12-02 LAB — CBC
Hematocrit: 34.4 % (ref 34.0–46.6)
Hemoglobin: 11.4 g/dL (ref 11.1–15.9)
MCH: 30.6 pg (ref 26.6–33.0)
MCHC: 33.1 g/dL (ref 31.5–35.7)
MCV: 92 fL (ref 79–97)
Platelets: 239 10*3/uL (ref 150–450)
RBC: 3.73 x10E6/uL — ABNORMAL LOW (ref 3.77–5.28)
RDW: 11.5 % — ABNORMAL LOW (ref 11.7–15.4)
WBC: 13.8 10*3/uL — ABNORMAL HIGH (ref 3.4–10.8)

## 2021-12-02 LAB — RPR: RPR Ser Ql: NONREACTIVE

## 2021-12-02 LAB — GLUCOSE TOLERANCE, 2 HOURS W/ 1HR
Glucose, 1 hour: 127 mg/dL (ref 70–179)
Glucose, 2 hour: 59 mg/dL — ABNORMAL LOW (ref 70–152)
Glucose, Fasting: 82 mg/dL (ref 70–91)

## 2021-12-02 LAB — HIV ANTIBODY (ROUTINE TESTING W REFLEX): HIV Screen 4th Generation wRfx: NONREACTIVE

## 2021-12-02 LAB — ANTIBODY SCREEN: Antibody Screen: NEGATIVE

## 2021-12-04 ENCOUNTER — Ambulatory Visit: Payer: Medicaid Other | Admitting: Clinical

## 2021-12-04 DIAGNOSIS — F909 Attention-deficit hyperactivity disorder, unspecified type: Secondary | ICD-10-CM

## 2021-12-04 DIAGNOSIS — F4310 Post-traumatic stress disorder, unspecified: Secondary | ICD-10-CM

## 2021-12-05 ENCOUNTER — Other Ambulatory Visit: Payer: Self-pay

## 2021-12-05 ENCOUNTER — Inpatient Hospital Stay (HOSPITAL_COMMUNITY): Payer: Medicaid Other

## 2021-12-05 ENCOUNTER — Observation Stay (HOSPITAL_COMMUNITY)
Admission: AD | Admit: 2021-12-05 | Discharge: 2021-12-06 | Discharge status: Against Medical Advice | Payer: Medicaid Other | Attending: Obstetrics & Gynecology | Admitting: Obstetrics & Gynecology

## 2021-12-05 DIAGNOSIS — J45909 Unspecified asthma, uncomplicated: Secondary | ICD-10-CM | POA: Insufficient documentation

## 2021-12-05 DIAGNOSIS — O2313 Infections of bladder in pregnancy, third trimester: Secondary | ICD-10-CM | POA: Diagnosis not present

## 2021-12-05 DIAGNOSIS — Z7982 Long term (current) use of aspirin: Secondary | ICD-10-CM | POA: Insufficient documentation

## 2021-12-05 DIAGNOSIS — Z3A29 29 weeks gestation of pregnancy: Secondary | ICD-10-CM | POA: Diagnosis not present

## 2021-12-05 DIAGNOSIS — Z20822 Contact with and (suspected) exposure to covid-19: Secondary | ICD-10-CM | POA: Diagnosis not present

## 2021-12-05 DIAGNOSIS — Z87891 Personal history of nicotine dependence: Secondary | ICD-10-CM | POA: Insufficient documentation

## 2021-12-05 DIAGNOSIS — G40909 Epilepsy, unspecified, not intractable, without status epilepticus: Secondary | ICD-10-CM | POA: Diagnosis not present

## 2021-12-05 DIAGNOSIS — O99353 Diseases of the nervous system complicating pregnancy, third trimester: Secondary | ICD-10-CM | POA: Diagnosis present

## 2021-12-05 DIAGNOSIS — Z5329 Procedure and treatment not carried out because of patient's decision for other reasons: Secondary | ICD-10-CM | POA: Diagnosis present

## 2021-12-05 DIAGNOSIS — O99513 Diseases of the respiratory system complicating pregnancy, third trimester: Secondary | ICD-10-CM | POA: Insufficient documentation

## 2021-12-05 DIAGNOSIS — Z91040 Latex allergy status: Secondary | ICD-10-CM | POA: Diagnosis not present

## 2021-12-05 DIAGNOSIS — O133 Gestational [pregnancy-induced] hypertension without significant proteinuria, third trimester: Secondary | ICD-10-CM | POA: Diagnosis not present

## 2021-12-05 DIAGNOSIS — Z880 Allergy status to penicillin: Secondary | ICD-10-CM

## 2021-12-05 DIAGNOSIS — R569 Unspecified convulsions: Secondary | ICD-10-CM

## 2021-12-05 DIAGNOSIS — R404 Transient alteration of awareness: Secondary | ICD-10-CM | POA: Diagnosis present

## 2021-12-05 DIAGNOSIS — Z87440 Personal history of urinary (tract) infections: Secondary | ICD-10-CM

## 2021-12-05 LAB — COMPREHENSIVE METABOLIC PANEL
ALT: 12 U/L (ref 0–44)
AST: 11 U/L — ABNORMAL LOW (ref 15–41)
Albumin: 3 g/dL — ABNORMAL LOW (ref 3.5–5.0)
Alkaline Phosphatase: 68 U/L (ref 38–126)
Anion gap: 13 (ref 5–15)
BUN: 7 mg/dL (ref 6–20)
CO2: 20 mmol/L — ABNORMAL LOW (ref 22–32)
Calcium: 9.5 mg/dL (ref 8.9–10.3)
Chloride: 105 mmol/L (ref 98–111)
Creatinine, Ser: 0.59 mg/dL (ref 0.44–1.00)
GFR, Estimated: >60 mL/min (ref >60)
Glucose, Bld: 93 mg/dL (ref 70–99)
Potassium: 3.7 mmol/L (ref 3.5–5.1)
Sodium: 138 mmol/L (ref 135–145)
Total Bilirubin: 0.2 mg/dL — ABNORMAL LOW (ref 0.3–1.2)
Total Protein: 6 g/dL — ABNORMAL LOW (ref 6.5–8.1)

## 2021-12-05 LAB — CBC
HCT: 33 % — ABNORMAL LOW (ref 36.0–46.0)
Hemoglobin: 11.4 g/dL — ABNORMAL LOW (ref 12.0–15.0)
MCH: 31.7 pg (ref 26.0–34.0)
MCHC: 34.5 g/dL (ref 30.0–36.0)
MCV: 91.7 fL (ref 80.0–100.0)
Platelets: 238 10*3/uL (ref 150–400)
RBC: 3.6 MIL/uL — ABNORMAL LOW (ref 3.87–5.11)
RDW: 11.9 % (ref 11.5–15.5)
WBC: 18.5 10*3/uL — ABNORMAL HIGH (ref 4.0–10.5)
nRBC: 0 % (ref 0.0–0.2)

## 2021-12-05 LAB — TYPE AND SCREEN
ABO/RH(D): O POS
Antibody Screen: NEGATIVE

## 2021-12-05 LAB — RESP PANEL BY RT-PCR (FLU A&B, COVID) ARPGX2
Influenza A by PCR: NEGATIVE
Influenza B by PCR: NEGATIVE
SARS Coronavirus 2 by RT PCR: NEGATIVE

## 2021-12-05 IMAGING — DX DG CHEST 1V PORT
1 series · 1 of 1 positions shown · non-contrast
Comparison: [DATE]

CLINICAL DATA: Pregnant patient status post seizure.

EXAM:
PORTABLE CHEST 1 VIEW

[chest]
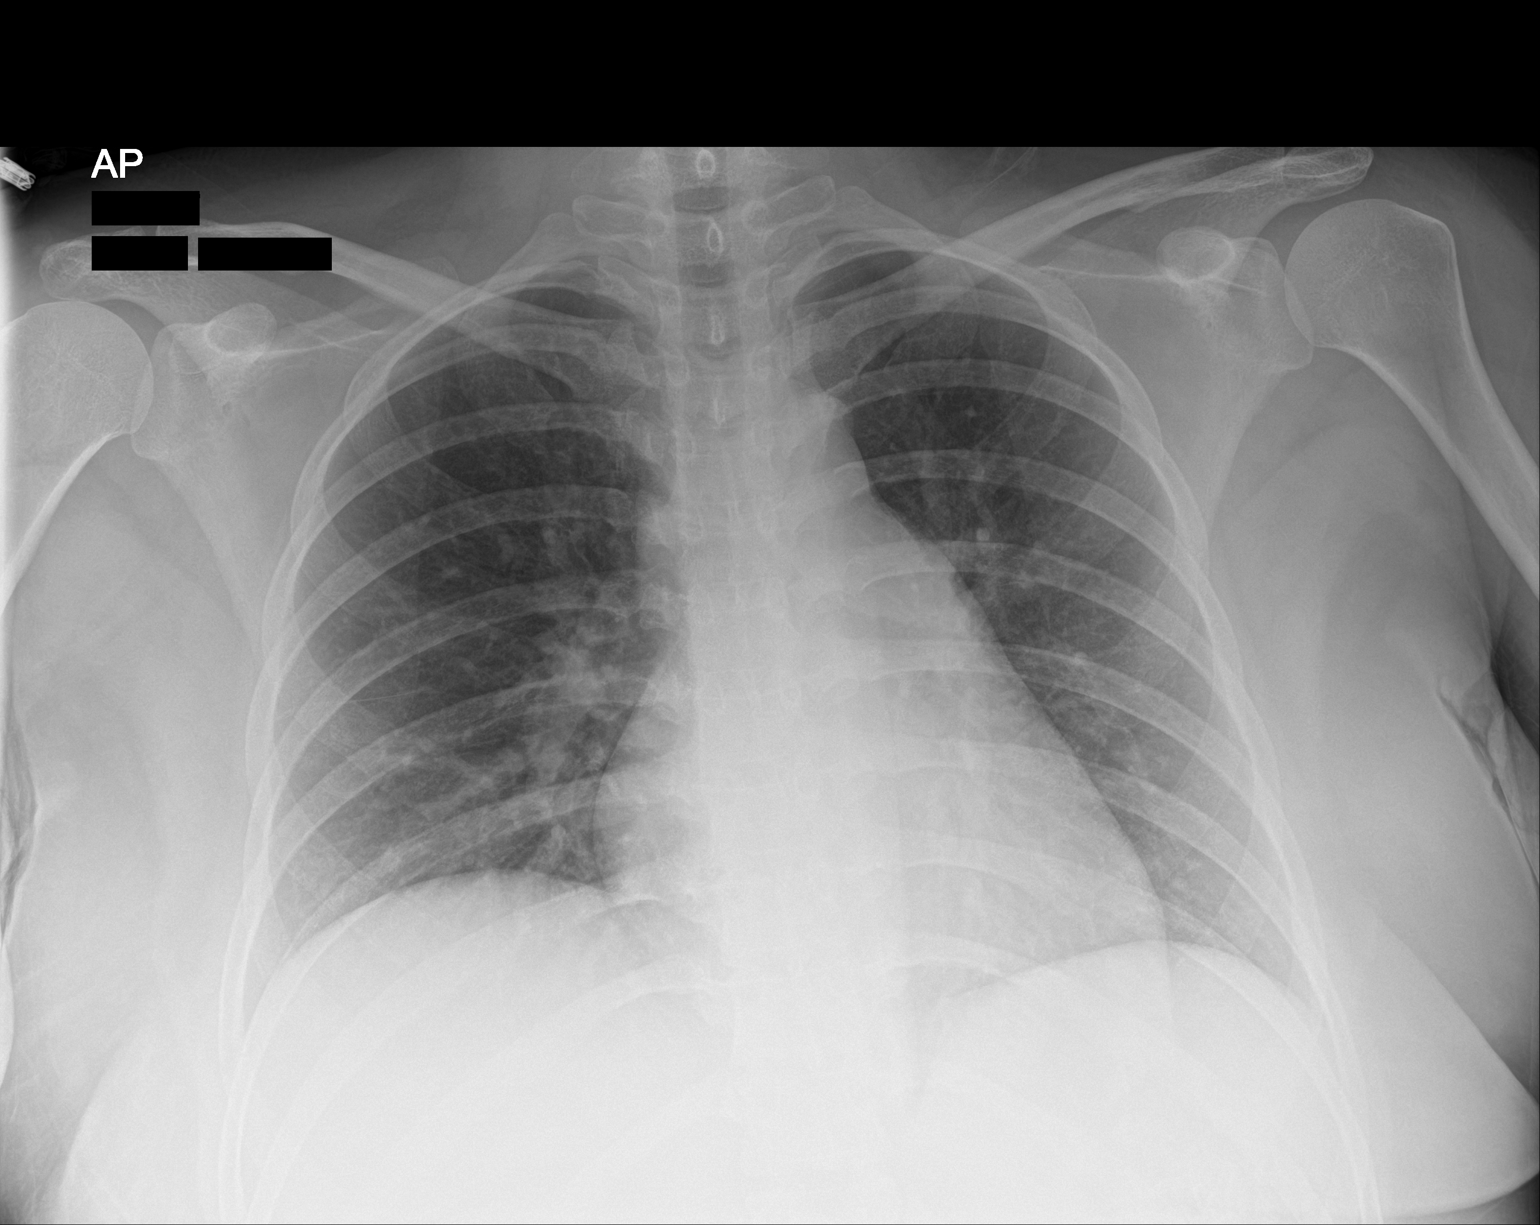

[1 of 1 positions shown; findings below may reference images not displayed]

FINDINGS: The heart size and mediastinal contours are within normal limits.
Both lungs are clear. The visualized skeletal structures are
unremarkable.
IMPRESSION: No active disease.

## 2021-12-05 MED ORDER — BETAMETHASONE SOD PHOS & ACET 6 (3-3) MG/ML IJ SUSP
12.0000 mg | INTRAMUSCULAR | Status: AC
  Administered 2021-12-05 – 2021-12-06 (×2): 12 mg via INTRAMUSCULAR
  Filled 2021-12-05: qty 5

## 2021-12-05 MED ORDER — LORAZEPAM 2 MG/ML IJ SOLN
1.0000 mg | Freq: Once | INTRAMUSCULAR | Status: AC
  Administered 2021-12-05: 1 mg via INTRAVENOUS

## 2021-12-05 MED ORDER — LACTATED RINGERS IV SOLN: Freq: Once | INTRAVENOUS | Status: AC

## 2021-12-05 MED ORDER — HYDRALAZINE HCL 20 MG/ML IJ SOLN: 10.0000 mg | INTRAMUSCULAR | Status: DC | PRN

## 2021-12-05 MED ORDER — MAGNESIUM SULFATE BOLUS VIA INFUSION
6.0000 g | Freq: Once | INTRAVENOUS | Status: AC
  Administered 2021-12-05: 6 g via INTRAVENOUS
  Filled 2021-12-05: qty 1000

## 2021-12-05 MED ORDER — LABETALOL HCL 5 MG/ML IV SOLN: 80.0000 mg | INTRAVENOUS | Status: DC | PRN

## 2021-12-05 MED ORDER — LORAZEPAM 2 MG/ML IJ SOLN
INTRAMUSCULAR | Status: AC
  Filled 2021-12-05: qty 1

## 2021-12-05 MED ORDER — MAGNESIUM SULFATE 40 GM/1000ML IV SOLN
INTRAVENOUS | Status: AC
  Filled 2021-12-05: qty 1000

## 2021-12-05 MED ORDER — AMMONIA AROMATIC IN INHA
RESPIRATORY_TRACT | Status: AC
  Filled 2021-12-05: qty 10

## 2021-12-05 MED ORDER — LABETALOL HCL 5 MG/ML IV SOLN: 40.0000 mg | INTRAVENOUS | Status: DC | PRN

## 2021-12-05 MED ORDER — LEVETIRACETAM IN NACL 1500 MG/100ML IV SOLN
1500.0000 mg | Freq: Once | INTRAVENOUS | Status: AC
  Administered 2021-12-05: 1500 mg via INTRAVENOUS
  Filled 2021-12-05: qty 100

## 2021-12-05 MED ORDER — LABETALOL HCL 5 MG/ML IV SOLN: 20.0000 mg | INTRAVENOUS | Status: DC | PRN

## 2021-12-05 MED ORDER — MAGNESIUM SULFATE 40 GM/1000ML IV SOLN
2.0000 g/h | INTRAVENOUS | Status: DC
  Administered 2021-12-05: 2 g/h via INTRAVENOUS

## 2021-12-05 NOTE — MAU Provider Note (Signed)
History     CSN: [id number]  Arrival date and time: 12/05/21 2243   Event Date/Time   First Provider Initiated Contact with Patient 12/05/21 2310      Chief Complaint  Patient presents with   Headache   Hypertension   HPI Erin Krueger is a 26 y.o. [id number] at [redacted]w[redacted]d who presents to MAU with chief complaint of headache since the morning of 12/04/2021. Patient also reports spots in her field of vision, onset coinciding with onset of headache. Patient became unresponsive and began to actively seize shortly after registering in MAU. Health history is significant for seizures managed on Keppra. Patient states she has been seizure free since [facility]. She took her morning dose of Keppra as prescribed this morning. She typically takes her bedtime does at 2330 and so has not yet taken it today.  She is accompanied by her fiance Zack. OB History     Gravida  7   Para  4   Term  3   Preterm  1   AB  2   Living  3      SAB  2   IAB      Ectopic      Multiple  0   Live Births  3           Past Medical History:  Diagnosis Date   ADHD (attention deficit hyperactivity disorder)    Adopted person 10/04/2019   Does not know personal family history other than Diabetes, Breast Cancer. Raised in foster care   Allergy    Anxiety    Asthma    Eating disorder    Headache(784.0)    Kidney stone    kidney stones   Migraines    ODD (oppositional defiant disorder)    PID (acute pelvic inflammatory disease) 01/29/2016   PTSD (post-traumatic stress disorder)    Seizures ([place])    diagnosed age 63    Past Surgical History:  Procedure Laterality Date   right knee surgery     TONSILLECTOMY AND ADENOIDECTOMY      Family History  Problem Relation Age of Onset   Diabetes Mother    Diabetes Sister    Breast cancer Maternal Grandmother    Diabetes Maternal Grandmother    Glaucoma Maternal Grandmother    Other Son        born with hole in heart    Other Daughter        born with hole in heart    Social History   Tobacco Use   Smoking status: Former    Packs/day: 0.50    Types: Cigarettes, E-cigarettes   Smokeless tobacco: Never   Tobacco comments:    nicotine patch  Vaping Use   Vaping Use: Former   Substances: Flavoring  Substance Use Topics   Alcohol use: Not Currently   Drug use: Not Currently    Types: Marijuana    Comment: Not since 02/13    Allergies:  Allergies  Allergen Reactions   Bee Venom Anaphylaxis   Penicillins Anaphylaxis    * Has tolerated several cephalosporins* Has patient had a PCN reaction causing immediate rash, facial/tongue/throat swelling, SOB or lightheadedness with hypotension: Unknown Has patient had a PCN reaction causing severe rash involving mucus membranes or skin necrosis: Unknown Has patient had a PCN reaction that required hospitalization: Unknown Has patient had a PCN reaction occurring within the last 10 years: Unknown If all of the above answers are "  NO", then may proceed with Cephalosporin use.    Ativan [Lorazepam]     Aggressive-agitation   Prednisone Other (See Comments)    Bleeding nose bleeding and internal bleeding   Latex Rash    Medications Prior to Admission  Medication Sig Dispense Refill Last Dose   aspirin 81 MG EC tablet Take 2 tablets (162 mg total) by mouth daily. Swallow whole. 180 tablet 2    diphenhydrAMINE (BENADRYL) 25 mg capsule Take 25 mg by mouth every 6 (six) hours as needed.      levETIRAcetam (KEPPRA) 750 MG tablet Take 2 tablets (1,500 mg total) by mouth 2 (two) times daily. 360 tablet 4    magnesium oxide (MAG-OX) 400 (240 Mg) MG tablet Take 1 tablet (400 mg total) by mouth 2 (two) times daily. 180 tablet 4    nitrofurantoin, macrocrystal-monohydrate, (MACROBID) 100 MG capsule Take 1 capsule (100 mg total) by mouth at bedtime for 90 doses. 30 capsule 2    Prenatal Vit-Fe Fumarate-FA (PRENATAL VITAMIN PO) Take by mouth.       Review of  Systems  Eyes:  Positive for photophobia and visual disturbance.  Neurological:  Positive for seizures and headaches.  All other systems reviewed and are negative. Physical Exam   unknown if currently breastfeeding.  Physical Exam Vitals and nursing note reviewed. Exam conducted with a chaperone present.  Constitutional:      General: She is in acute distress.  Cardiovascular:     Pulses: Normal pulses.  Pulmonary:     Effort: Pulmonary effort is normal.  Abdominal:     Comments: Gravid  Neurological:     Mental Status: She is unresponsive.    MAU Course  Procedures   --MAU staff called emergently by MAU registration staff. --CNM accompanied all MAU RNs to lobby where patient was found to be lying supine, head beneath waiting room chair, actively seizing.  --Patient assisted to right lateral, moved from underneath chair by CNM. Attending, RR RN, and House Coverage requested in MAU --Patient with 4-person assist to stretcher, moved to MAU exam room --Dr. Ilda Basset and all requested staff present a short time later. Orders placed per his assessment. Patient with new soft vocalizations.Magnesium Sulfate initiated for persistent severe range blood pressure. RR RN present in unit. Additional orders placed per her communications with Neurology --Reactive tracing: baseline 140, mod var, + 1 accels, no decels --Toco: not applied, no contractions palpated  Orders Placed This Encounter  Procedures   Resp Panel by RT-PCR (Flu A&B, Covid) Nasopharyngeal Swab   DG Chest Port 1 View   CBC   Comprehensive metabolic panel   Rapid urine drug screen (hospital performed)   Notify physician (specify) Confirmatory reading of BP> 160/110 15 minutes later   Strict intake and output   Measure blood pressure   Type and screen [place] peripheral IV   Patient Vitals for the past 24 hrs:  BP Temp Temp src Pulse Resp SpO2  12/06/21 0000 129/88 97.8 F (36.6 C) Oral 96  20 98 %  12/05/21 2310 126/79 -- -- (!) 111 -- 99 %  12/05/21 2300 118/71 -- -- (!) 114 -- 97 %  12/05/21 2255 (!) 171/141 -- -- (!) 106 -- 98 %  12/05/21 2252 (!) 160/140 -- -- 83 -- --   Meds ordered this encounter  Medications   lactated ringers infusion   magnesium bolus via infusion 6 g   magnesium sulfate 40 grams in SWI  1000 mL OB infusion   DISCONTD: labetalol (NORMODYNE) injection 20 mg   DISCONTD: labetalol (NORMODYNE) injection 40 mg   DISCONTD: labetalol (NORMODYNE) injection 80 mg   DISCONTD: hydrALAZINE (APRESOLINE) injection 10 mg   betamethasone acetate-betamethasone sodium phosphate (CELESTONE) injection 12 mg   magnesium sulfate 40 grams in SWI 1000 mL 40 GM/1000ML infusion    Wilson, Kathrine M: cabinet override   ammonia inhalant    Elio Forget M: cabinet override   levETIRAcetam (KEPPRA) IVPB 1500 mg/ 100 mL premix   LORazepam (ATIVAN) injection 1 mg   LORazepam (ATIVAN) 2 MG/ML injection    Elio Forget M: cabinet override   Assessment and Plan  --26 y.o. [id number] at [redacted]w[redacted]d  --Reactive tracing --A&O x 4 s/p Magnesium Sulfate, IV Keppra and IV Ativan --Per Dr. Ilda Basset, admit to [facility] --Neurology inbound to bedside  Darlina Rumpf, [place] 12/05/2021, 12:08 AM

## 2021-12-05 NOTE — MAU Provider Note (Signed)
MAU Note Time: 2315 G7P3 at 29/0 weeks with known seizure d/o and new gestational HTN.   Patient checked in via fiance with complaint of HA for past two days and had seizure like activity in the lobby; per fiance, patient has been compliant on keppra that was increased a few weeks ago when she was admitted for same issue.   Patient moved to triage room and initial assessment done. Pt post ictal and gradually coming to; no e/o incontinence, NAD, normal s1 and s2, no MRGs, CTAB and no resp distress. BP normal in the 120s/70s and HR came down to 100s from 140s. Fetal heart tones normal and fetus category I with a accels. Pre-eclampsia labs drawn and IV Mg bolus and maintenance started due to concern for severe pre-eclampsia even though BPs appear normal; antenatal steroids also ordered for fetal lung maturity in case patient's condition warrants preterm delivery. Neuro rapid response present and to relay situation to their on call who is in a code stroke presently. Patient had another seizure approximately 10-15 minutes later; her eyes were opened and twitching was noted and she did not respond to chest rub or alcohol swab to noise and seizure like activity stopped after approximately 15 seconds; her pulse ox stayed normal and she had generalized but small shaking. After this, I asked the neuro rapid response to ask their on call if they wanted to do what they did last time which was IV keppra and ativan; last time they needed to do two doses of keppra IV 1500mg  and two doses of ativan 2mg  IV.  Will get pCXR just to make sure lungs are fine when patient is clear and likely CT head non contrast. Plan to admit to antepartum   MD Attending Center for [facility]) GYN Consult Phone: [phone] (M-F, [phone]) & [phone] (Off hours, weekends, holidays)

## 2021-12-05 NOTE — MAU Note (Signed)
2245-Pt signed in at registration desk with c/o headache and hypertension. Pt tehn sat in lobby with S/O. Observed pt on video monitor and she was on the ground. Staff went out to assess pt.S. Weinhold, CNM present and observed pt having grand mal seizure for about 1 minute. Pt unresponsive .Pt was placed on her side by S/O and environment was secured.  Lifted pt to gurney and brought to room.   2247-RROB ,RRRN , Dr. Gomez Cleverly and [facility] called to help.    2248-IV LR stared in right hand(see flow sheet).  Labs drawn. Pt started becoming more responsive after about 5 min ( ammonia inhalent used as well).  2250-FHR 148 See flow sheet for v/s  2255- pt more alert saying a few words  2256-attempting to start second IV line  2302- 6gm bolus MGSO4 given  2305-Pt began to seize again. Rolled to right  side. Lasted 45sec-1 min.  2310- Ammonia inhalant used again and pt become more responsive.  2318- 1 mg ativan given 2324- Kepra IV started 2325- Pt A/A/O  asking questions about baby and plan of care.

## 2021-12-05 NOTE — BH Specialist Note (Signed)
Integrated Behavioral Health via Telemedicine Visit  12/18/2021 Erin Krueger [id number]  Number of Integrated Behavioral Health visits: 5 Session Start time: 10:54  Session End time: 11:09 Total time:  16  Referring Provider: Shawna Krueger, CNM Patient/Family location[facility] Provider location: [facility]  All persons participating in visit: [name] and Erin Krueger Erin Krueger   Types of Service: Individual psychotherapy and Telephone visit  I connected with Erin Krueger and/or Erin Krueger's  n/a  via  Telephone or Engineer, civil (consulting)  (Video is Surveyor, mining) and verified that I am speaking with the correct person using two identifiers. Discussed confidentiality: Yes   I discussed the limitations of telemedicine and the availability of in person appointments.  Discussed there is a possibility of technology failure and discussed alternative modes of communication if that failure occurs.  I discussed that engaging in this telemedicine visit, they consent to the provision of behavioral healthcare and the services will be billed under their insurance.  Patient and/or legal guardian expressed understanding and consented to Telemedicine visit: Yes   Presenting Concerns: Patient and/or family reports the following symptoms/concerns: Stress over difficulty obtaining [place] Krueger (needed for moving into new home), as she was not given her original birth certificate when she aged out of foster care; pt originally planned to use walk-in [facility] to establish care, but prefers to obtain scheduled initial appointment; requests follow up with Erin Krueger due to today's urgent priority. Duration of problem: Ongoing; Severity of problem: moderate  Patient and/or Family's Strengths/Protective Factors: Social connections, Concrete supports in place (healthy food, safe environments, etc.), Sense of purpose,  Physical Health (exercise, healthy diet, medication compliance, etc.), and Caregiver has knowledge of parenting & child development  Goals Addressed: Patient will:  Reduce symptoms of: anxiety and stress   Progress towards Goals: Ongoing  Interventions: Interventions utilized:  Supportive Reflection Standardized Assessments completed: Not Needed  Patient and/or Family Response: Pt agrees with ongoing treatment plan  Assessment: Patient currently experiencing PTSD, ADHD (both previously diagnosed via psychiatry).   Patient may benefit from continued brief therapeutic interventions until established with ongoing therapy  Plan: Follow up with behavioral health clinician on : Two weeks Behavioral recommendations:  -Continue with plan to obtain all documents needed to obtain Erin Krueger today -Continue attending scheduled childcare classes, taking daily prenatal vitamin, reducing cigarettes to no more than 4/day; considering walk-in [facility], as discussed Referral(s): Integrated [employer] (In Clinic)  I discussed the assessment and treatment plan with the patient and/or parent/guardian. They were provided an opportunity to ask questions and all were answered. They agreed with the plan and demonstrated an understanding of the instructions.   They were advised to call back or seek an in-person evaluation if the symptoms worsen or if the condition fails to improve as anticipated.  Valetta Close Kayelee Herbig, LCSW

## 2021-12-05 NOTE — Significant Event (Addendum)
Rapid Response Event Note   Reason for Call :  Pt came to MAU with headaches. She had a seizure while in the waiting room, was moved to the [facility] ED, and RRT was asked to assist.   Initial Focused Assessment:  Pt lying in bed with eyes closed, in no distress. She is post-ictal. She is disoriented but able to follow commands and move all extremities. Pupils 4, equal, and sluggish. She has nystagmus. Skin hot to touch.   HR-106, BP-171/141, RR-19, SpO2-98% on RA.  After pt assessed, she had another seizure lasting approx 45 secs. Neuro MD notified. Keppra/Ativan ordered.   Interventions:  6g Mg bolus given, Mg gtt at 2g/hr 1500mg  Keppra IV 1mg  Ativan IV Plan of Care:  Pt MS improving after interventions. Neuro to come see pt.  Plan to admit to [facility] speciality care, obtain PCXR, and CT head. Continue to monitor pt closely. Call RRT if further assistance needed.   Event Summary:   MD Notified: Dr. [facility] at bedside on RRT arrival, Dr. [phone] MD) consulted Call [phone] Arrival [phone] End Time:2330  TFTD:3220, RN

## 2021-12-06 ENCOUNTER — Inpatient Hospital Stay (HOSPITAL_COMMUNITY): Payer: Medicaid Other

## 2021-12-06 ENCOUNTER — Encounter (HOSPITAL_COMMUNITY): Payer: Self-pay | Admitting: Obstetrics and Gynecology

## 2021-12-06 ENCOUNTER — Other Ambulatory Visit: Payer: Self-pay

## 2021-12-06 DIAGNOSIS — O99353 Diseases of the nervous system complicating pregnancy, third trimester: Secondary | ICD-10-CM | POA: Diagnosis not present

## 2021-12-06 DIAGNOSIS — Z3A29 29 weeks gestation of pregnancy: Secondary | ICD-10-CM | POA: Diagnosis not present

## 2021-12-06 DIAGNOSIS — Z5329 Procedure and treatment not carried out because of patient's decision for other reasons: Secondary | ICD-10-CM | POA: Diagnosis present

## 2021-12-06 DIAGNOSIS — R404 Transient alteration of awareness: Secondary | ICD-10-CM | POA: Diagnosis present

## 2021-12-06 DIAGNOSIS — G40909 Epilepsy, unspecified, not intractable, without status epilepticus: Secondary | ICD-10-CM | POA: Diagnosis not present

## 2021-12-06 DIAGNOSIS — R519 Headache, unspecified: Secondary | ICD-10-CM

## 2021-12-06 DIAGNOSIS — O133 Gestational [pregnancy-induced] hypertension without significant proteinuria, third trimester: Secondary | ICD-10-CM | POA: Diagnosis present

## 2021-12-06 DIAGNOSIS — Z87440 Personal history of urinary (tract) infections: Secondary | ICD-10-CM | POA: Diagnosis not present

## 2021-12-06 DIAGNOSIS — Z20822 Contact with and (suspected) exposure to covid-19: Secondary | ICD-10-CM | POA: Diagnosis present

## 2021-12-06 DIAGNOSIS — Z880 Allergy status to penicillin: Secondary | ICD-10-CM | POA: Diagnosis not present

## 2021-12-06 DIAGNOSIS — R569 Unspecified convulsions: Secondary | ICD-10-CM | POA: Diagnosis not present

## 2021-12-06 DIAGNOSIS — Z87891 Personal history of nicotine dependence: Secondary | ICD-10-CM | POA: Diagnosis not present

## 2021-12-06 LAB — RAPID URINE DRUG SCREEN, HOSP PERFORMED
Amphetamines: NOT DETECTED
Barbiturates: NOT DETECTED
Benzodiazepines: NOT DETECTED
Cocaine: NOT DETECTED
Opiates: NOT DETECTED
Tetrahydrocannabinol: NOT DETECTED

## 2021-12-06 IMAGING — MR MR MRV HEAD W/O CM
1 series · 20 of 48 positions shown · non-contrast
Comparison: Noncontrast brain MRI from earlier the same day.

CLINICAL DATA: Headache and pregnancy, rule out dural venous sinus
thrombosis.

EXAM:
MR VENOGRAM HEAD WITHOUT CONTRAST
TECHNIQUE: Angiographic images of the intracranial venous structures were
acquired using MRV technique without intravenous contrast.

[Series 2: MRV · coronal · 1.5mm · 0.43mm/px · 20 of 124 slices shown]
[im 1/124]
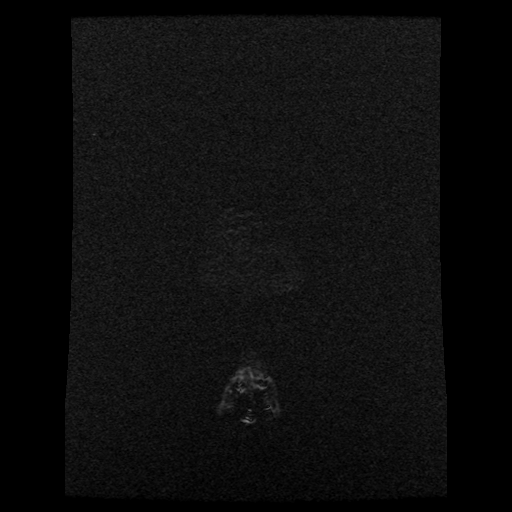
[im 3/124]
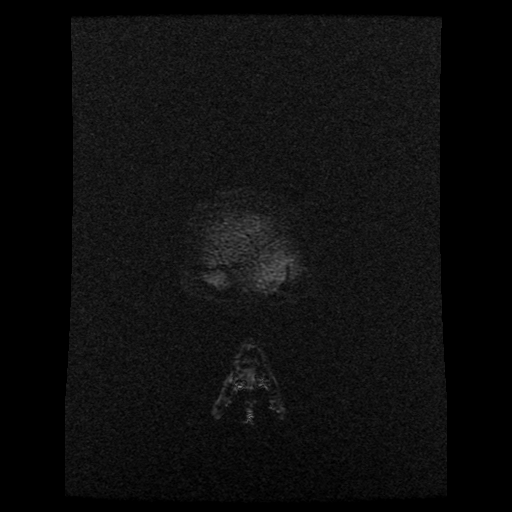
[im 6/124]
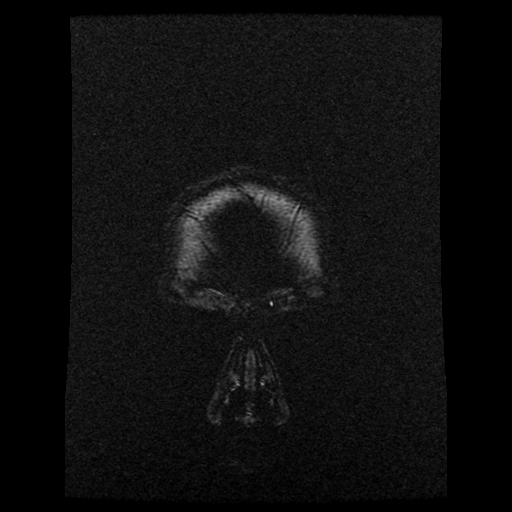
[im 8/124]
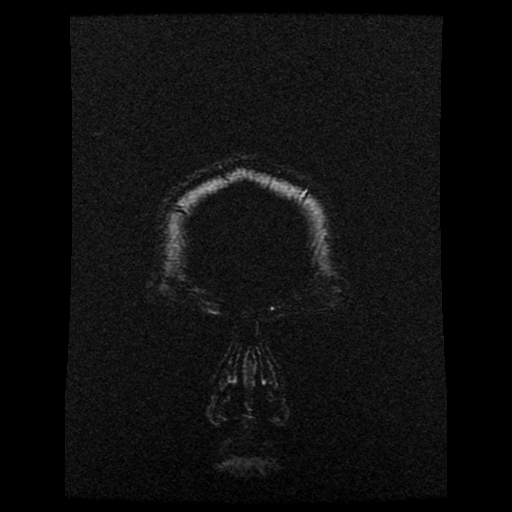
[im 11/124]
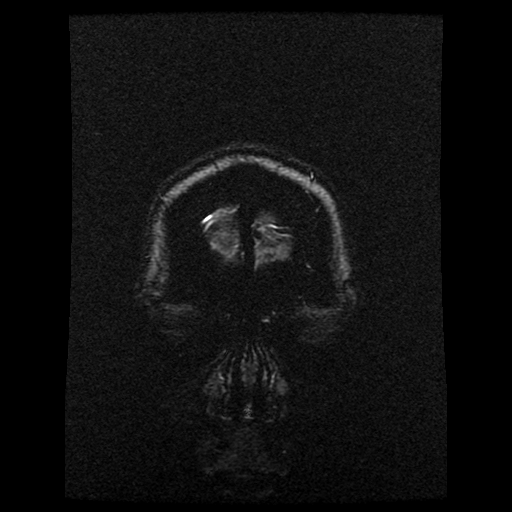
[im 14/124]
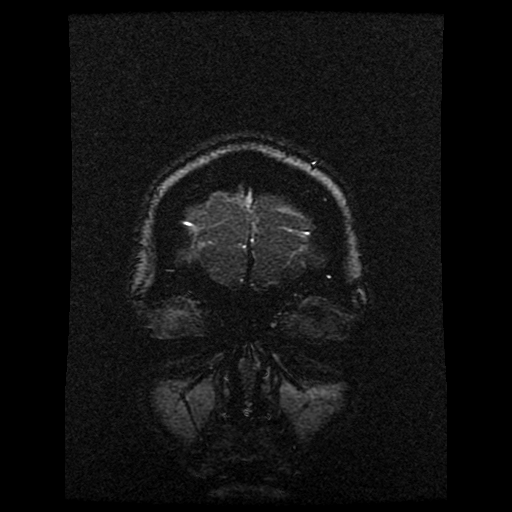
[im 16/124]
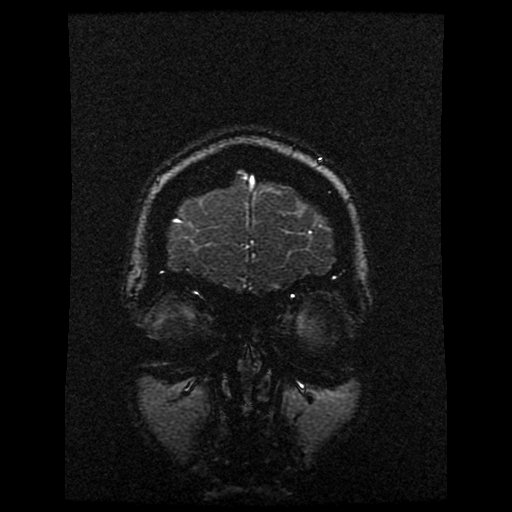
[im 19/124]
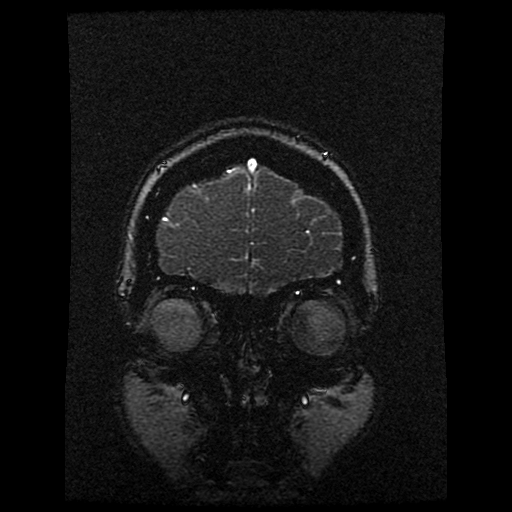
[im 21/124]
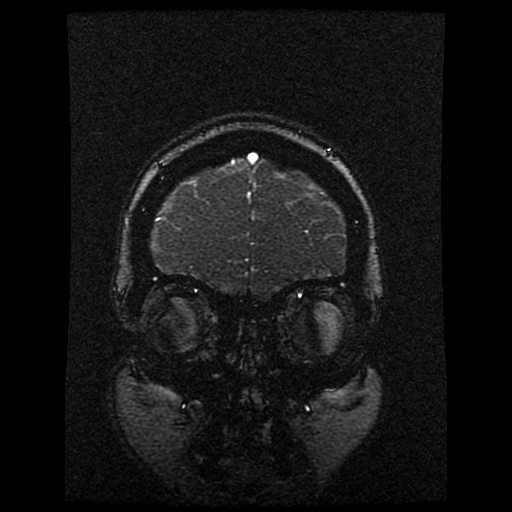
[im 24/124]
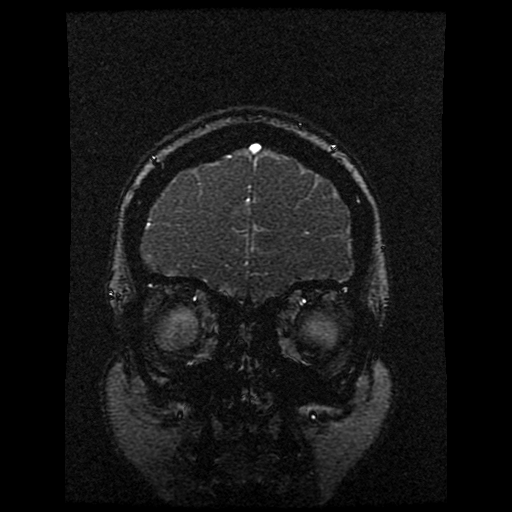
[im 27/124]
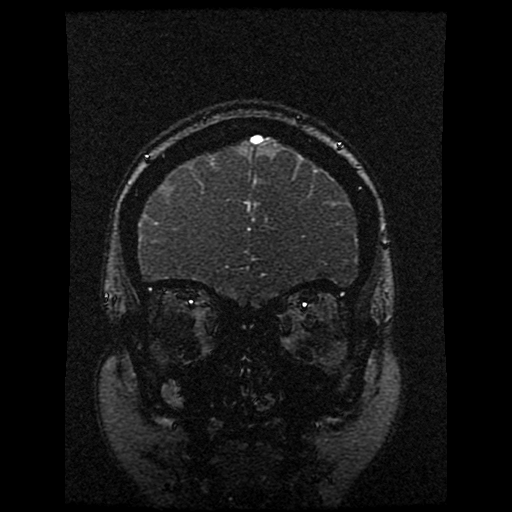
[im 29/124]
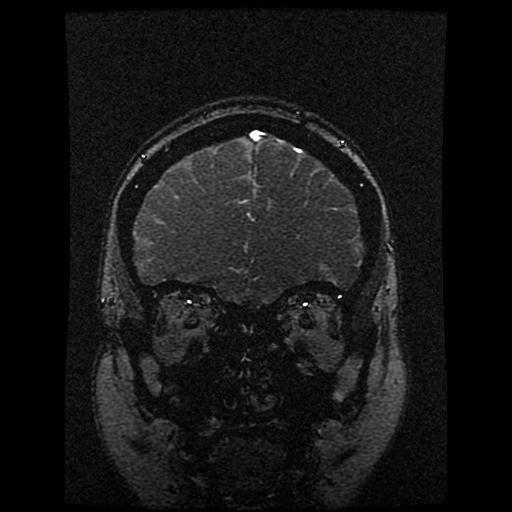
[im 40/124]
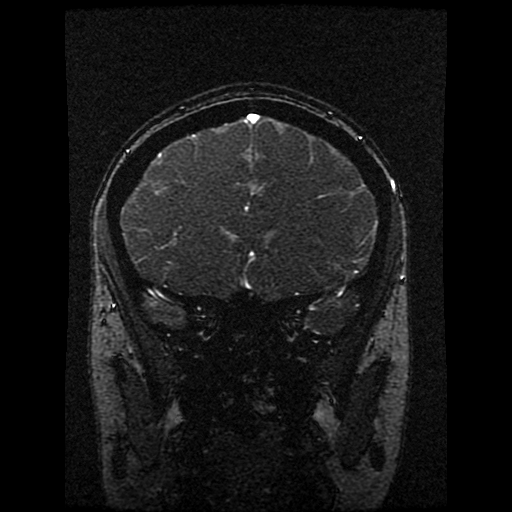
[im 55/124]
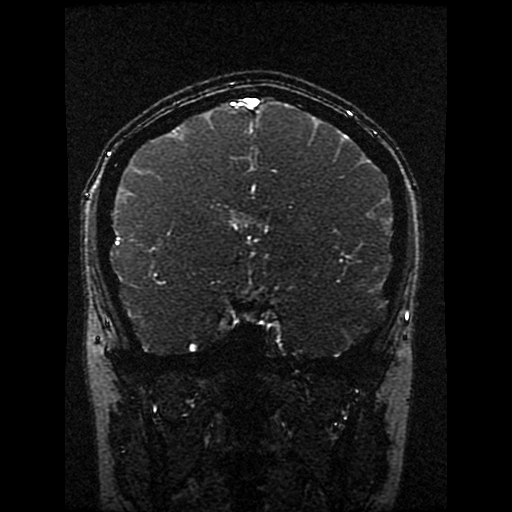
[im 63/124]
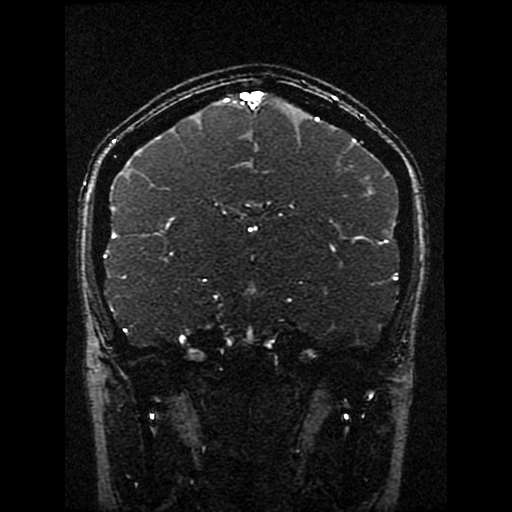
[im 71/124]
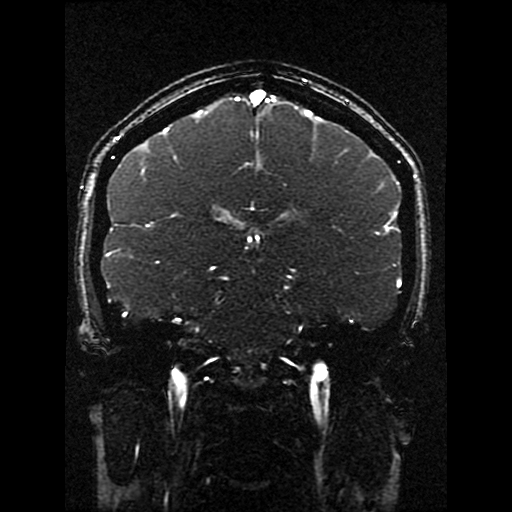
[im 87/124]
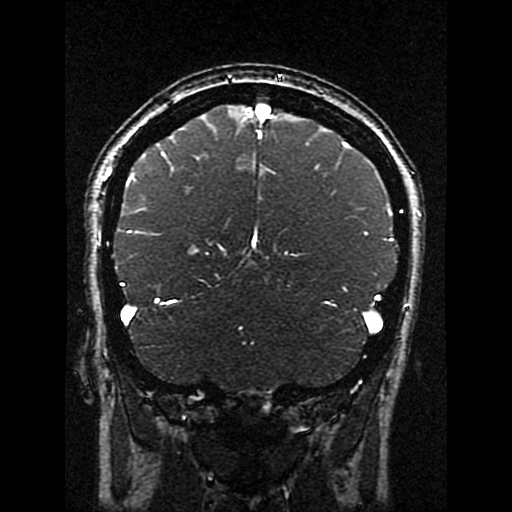
[im 103/124]
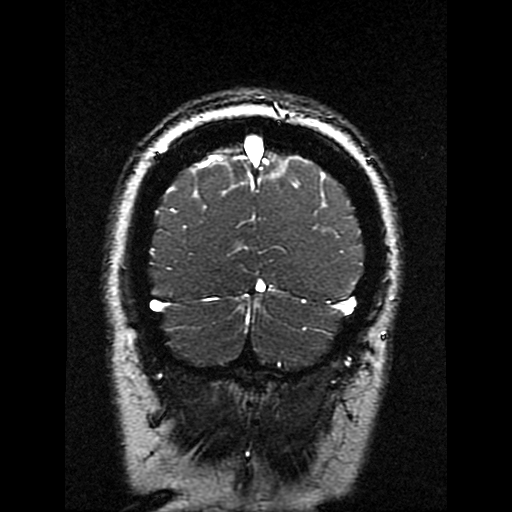
[im 105/124]
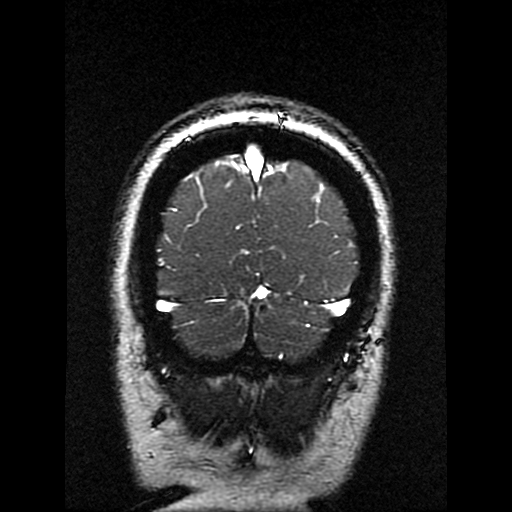
[im 118/124]
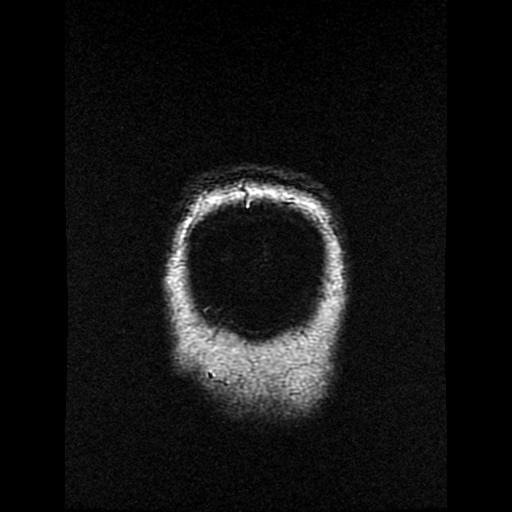

[20 of 48 positions shown; findings below may reference images not displayed]

FINDINGS: Reformats were generated and reviewed in PACS.

Robust flow in the dural sinuses and paired deep veins. No evidence
of cortical vein thrombosis.
IMPRESSION: Normal MRV.

## 2021-12-06 IMAGING — MR MR HEAD W/O CM
6 of 10 series · 27 of 48 positions shown · non-contrast
Comparison: [DATE]

CLINICAL DATA: Headache, new or worsening (pregnant)

EXAM:
MRI HEAD WITHOUT CONTRAST
TECHNIQUE: Multiplanar, multiecho pulse sequences of the brain and surrounding
structures were obtained without intravenous contrast.

[Series 2: DWI · axial · 3.0mm · 0.94mm/px · z∈[-105,+44]mm · 8 of 106 slices shown (1 of 2)]
[im 1/106]
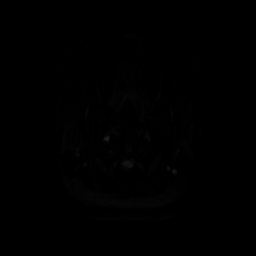
[im 12/106]
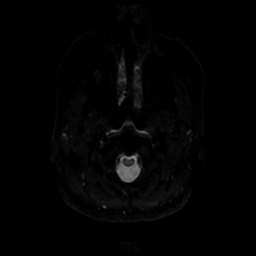
[im 36/106]
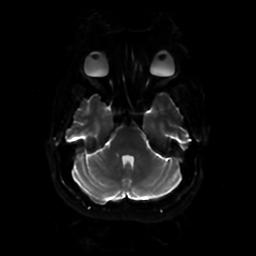
[im 47/106]
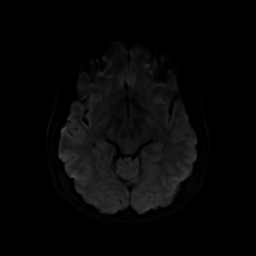
[im 59/106]
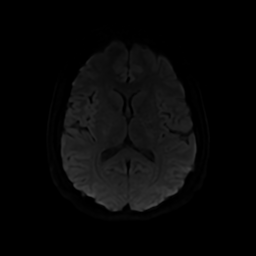
[im 71/106]
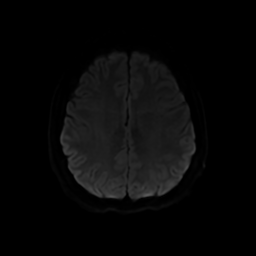
[im 94/106]
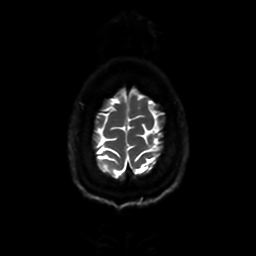
[im 106/106]
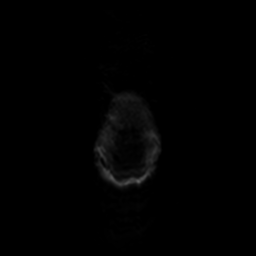

[Series 3: DWI · coronal · 4.0mm · 0.94mm/px · 6 of 74 slices shown (2 of 2)]
[im 1/74]
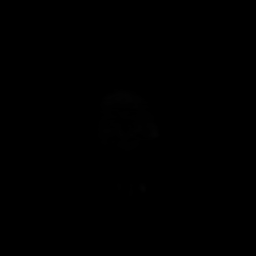
[im 15/74]
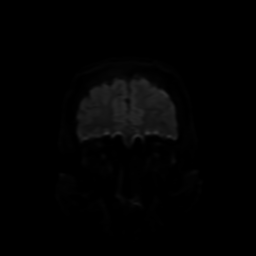
[im 30/74]
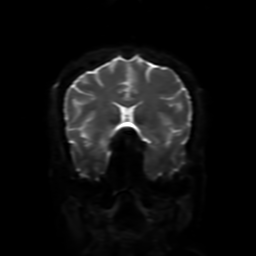
[im 44/74]
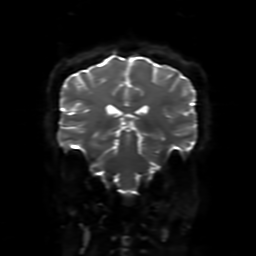
[im 59/74]
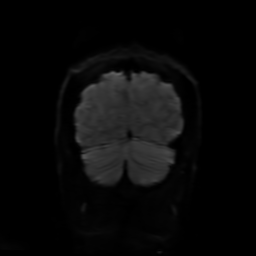
[im 74/74]
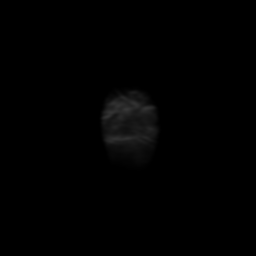

[Series 4: FLAIR · sagittal · 5.0mm · 0.23mm/px · 2 of 25 slices shown (1 of 2)]
[im 1/25]
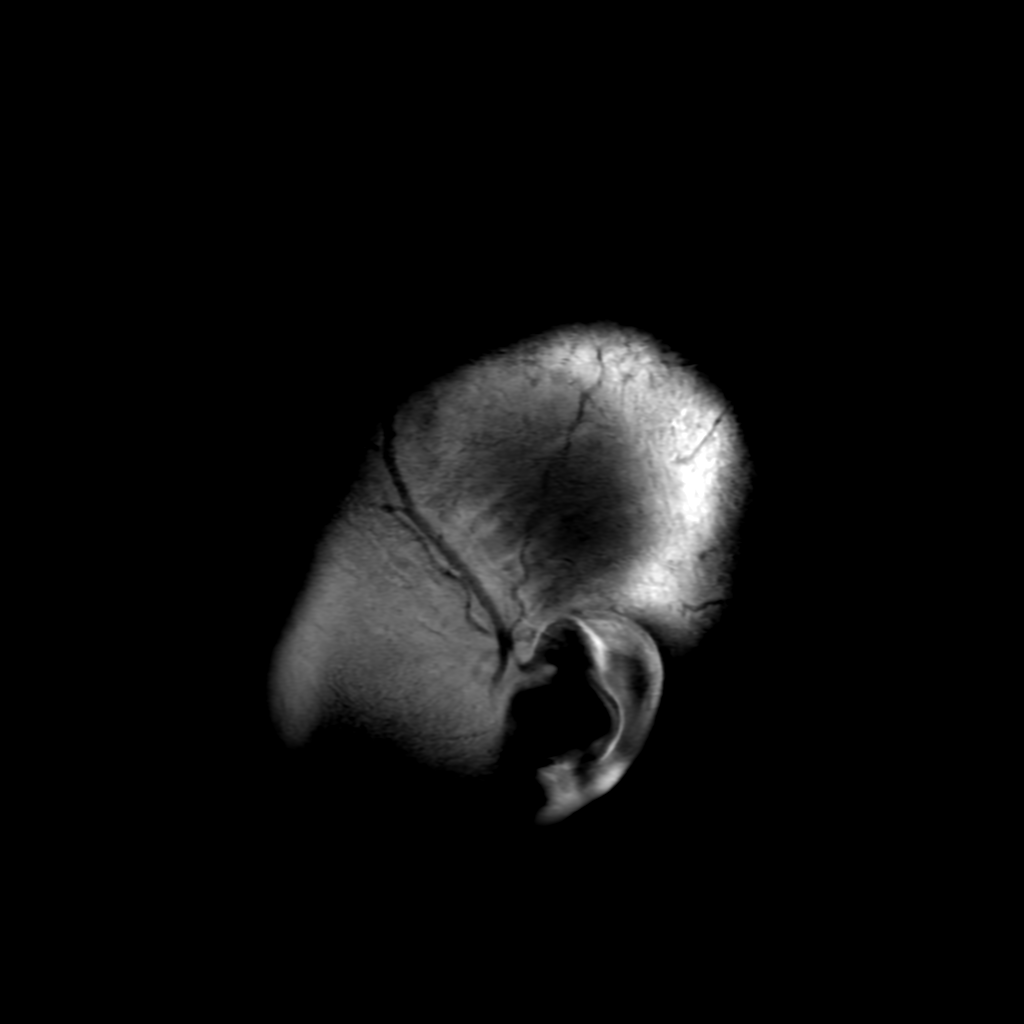
[im 25/25]
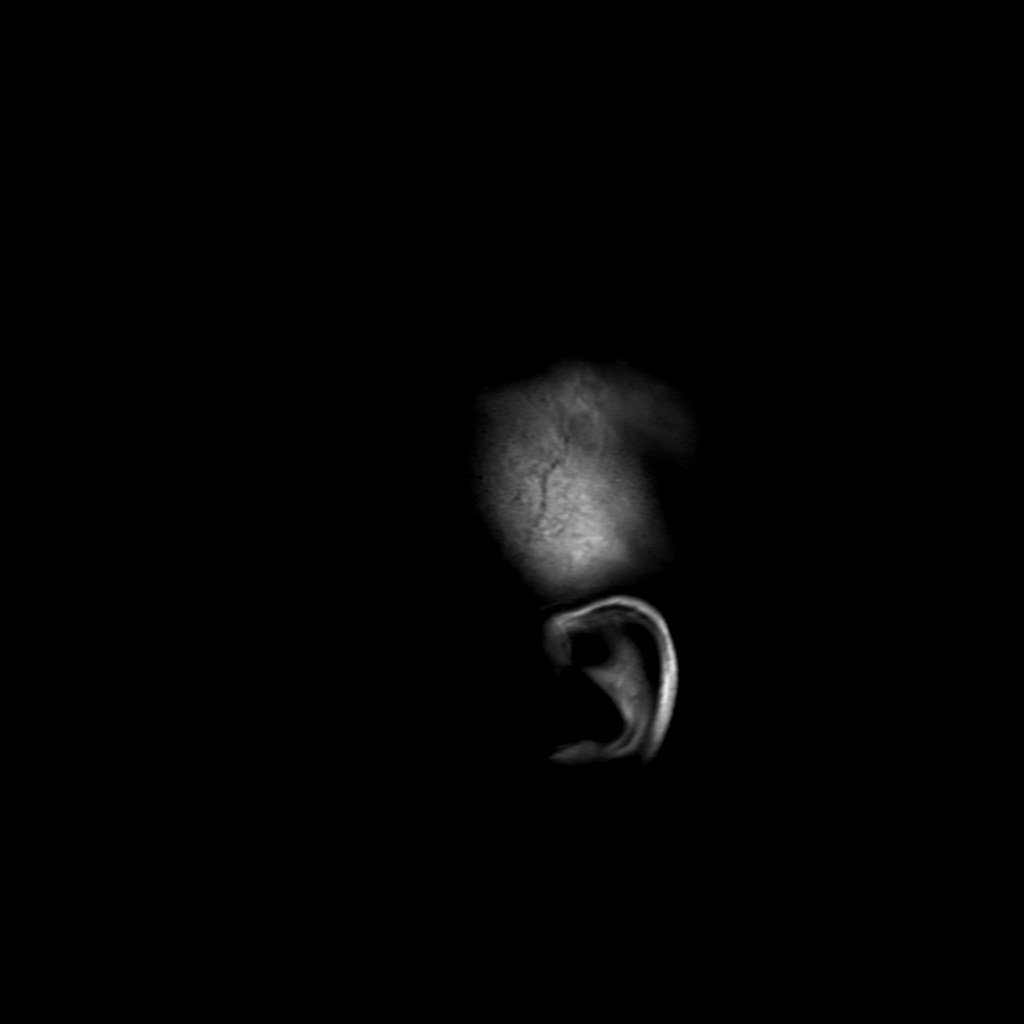

[Series 6: FLAIR · axial · 4.0mm · 0.45mm/px · z∈[-109,+47]mm · 3 of 38 slices shown (2 of 2)]
[im 1/38]
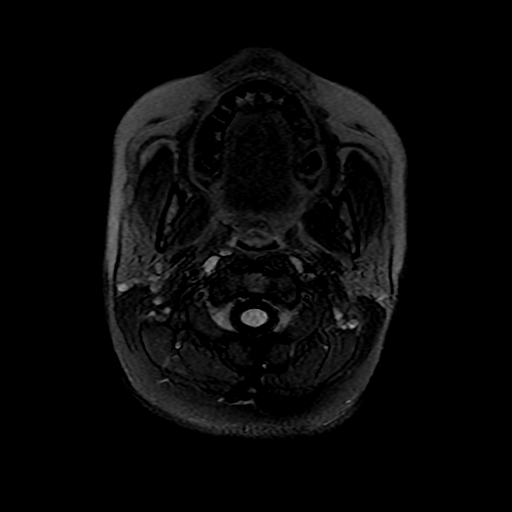
[im 19/38]
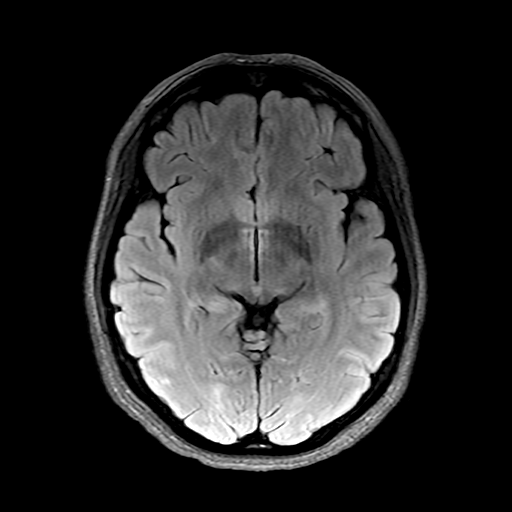
[im 38/38]
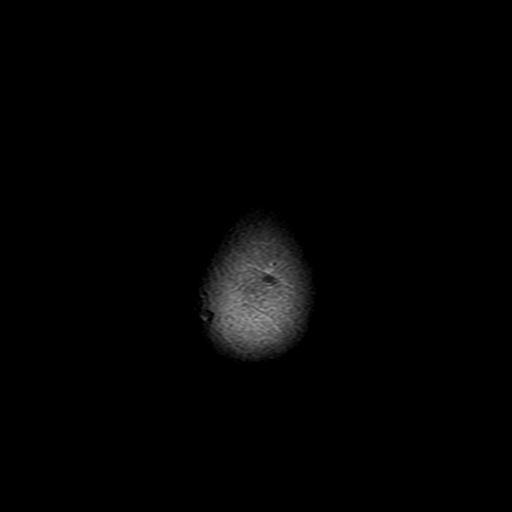

[Series 250: ADC · axial · 3.0mm · 0.94mm/px · z∈[-105,+44]mm · 5 of 53 slices shown (1 of 2)]
[im 1/53]
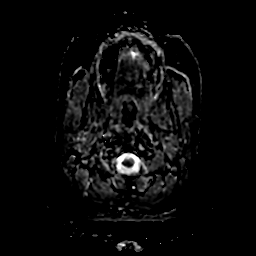
[im 14/53]
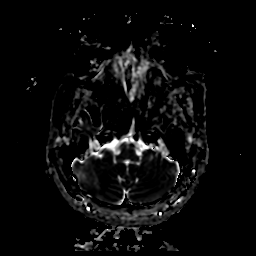
[im 27/53]
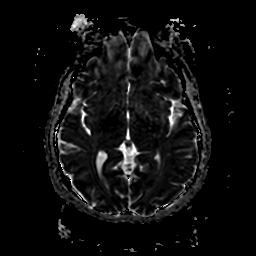
[im 40/53]
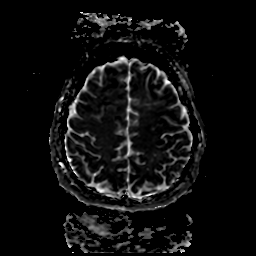
[im 53/53]
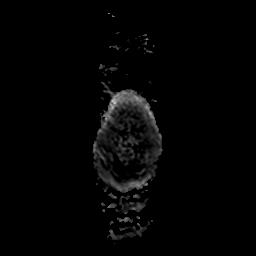

[Series 350: ADC · coronal · 4.0mm · 0.94mm/px · 3 of 35 slices shown (2 of 2)]
[im 1/35]
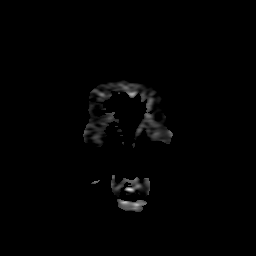
[im 18/35]
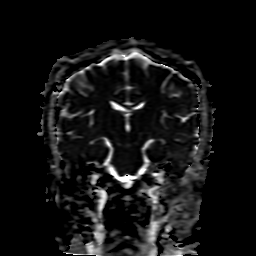
[im 35/35]
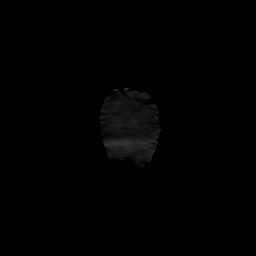

[27 of 48 positions shown; findings below may reference images not displayed]

FINDINGS: Brain: No acute infarction, hemorrhage, hydrocephalus, extra-axial
collection or mass lesion. No white matter disease.

Vascular: Normal flow voids.

Skull and upper cervical spine: Normal marrow signal

Sinuses/Orbits: Chronic, mild generalized mucosal thickening in
paranasal sinuses. Negative orbits
IMPRESSION: Normal MRI of the brain.

## 2021-12-06 MED ORDER — FAMOTIDINE IN NACL 20-0.9 MG/50ML-% IV SOLN
20.0000 mg | Freq: Once | INTRAVENOUS | Status: AC
  Administered 2021-12-06: 20 mg via INTRAVENOUS
  Filled 2021-12-06: qty 50

## 2021-12-06 MED ORDER — LEVETIRACETAM IN NACL 1500 MG/100ML IV SOLN: 1500.0000 mg | Freq: Two times a day (BID) | INTRAVENOUS | Status: DC

## 2021-12-06 MED ORDER — BUTALBITAL-APAP-CAFFEINE 50-325-40 MG PO TABS: 1.0000 | ORAL_TABLET | Freq: Four times a day (QID) | ORAL | 0 refills | Status: DC | PRN

## 2021-12-06 MED ORDER — BUTALBITAL-APAP-CAFFEINE 50-325-40 MG PO TABS
1.0000 | ORAL_TABLET | Freq: Four times a day (QID) | ORAL | Status: DC | PRN
  Administered 2021-12-06 (×2): 1 via ORAL
  Filled 2021-12-06 (×2): qty 1

## 2021-12-06 MED ORDER — LACTATED RINGERS IV SOLN: Freq: Once | INTRAVENOUS | Status: AC

## 2021-12-06 MED ORDER — ACETAMINOPHEN 325 MG PO TABS
650.0000 mg | ORAL_TABLET | ORAL | Status: DC | PRN
  Administered 2021-12-06: 650 mg via ORAL
  Filled 2021-12-06 (×2): qty 2

## 2021-12-06 NOTE — Progress Notes (Signed)
EEG complete - results pending 

## 2021-12-06 NOTE — Progress Notes (Signed)
LTM EEG discontinued - no skin breakdown at unhook.   

## 2021-12-06 NOTE — Progress Notes (Signed)
Patient left for home AMA after explaining her risks by MD.12 mg Betamethasone given in right gluteal per MD request. Reason for medication explained to patient and she agreed to take it.

## 2021-12-06 NOTE — Progress Notes (Signed)
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  While in the lobby, patient had two seizure-like events.  Subjective: Patient states that yesterday, she developed a headache with black spots in her vision and photophobia.  She attempted to use acetaminophen and various home remedies for this headache, but it did not improve and she presented to the MAU for evaluation.  While waiting to be seen, she had two episodes of seizure like activity in the lobby.  Patient states that she does not have any recollection of these episodes and the immediate time period afterwards.  She denies having any new or increased stressors in her life and states that she has been happy lately.  Exam: Vitals:   12/06/21 0918 12/06/21 1116  BP: 122/87 113/81  Pulse: (!) 105 (!) 107  Resp: 18 18  Temp: 98 F (36.7 C) 97.9 F (36.6 C)  SpO2: 95%    Gen: In bed, NAD Resp: non-labored breathing, no acute distress Abd: soft, nt  Neuro: Mental Status: alert and oriented x3, able to converse fluently and extensively about her medical condition Cranial Nerves:PERRL, EOMI, facial sensation and movement symmetrical, phonation normal, hearing intact to voice, tongue midline Motor: 5/5 strength in all extremities, normal bulk and tone Sensory: slightly decreased sensation in right foot, intact to light touch elsewhere DTR: 2+ throughout Gait: Deferred  Pertinent Labs: CBC    Component Value Date/Time   WBC 18.5 (H) 12/05/2021 2253   RBC 3.60 (L) 12/05/2021 2253   HGB 11.4 (L) 12/05/2021 2253   HGB 11.4 12/01/2021 0835   HCT 33.0 (L) 12/05/2021 2253   HCT 34.4 12/01/2021 0835   PLT 238 12/05/2021 2253   PLT 239 12/01/2021 0835   MCV 91.7 12/05/2021 2253   MCV 92 12/01/2021 0835   MCH 31.7 12/05/2021 2253   MCHC 34.5  12/05/2021 2253   RDW 11.9 12/05/2021 2253   RDW 11.5 (L) 12/01/2021 0835   LYMPHSABS 2.7 08/12/2021 1654   MONOABS 0.4 12/15/2020 2044   EOSABS 0.1 08/12/2021 1654   BASOSABS 0.0 08/12/2021 1654   CMP     Component Value Date/Time   NA 138 12/05/2021 2253   NA 139 12/01/2021 0920   K 3.7 12/05/2021 2253   CL 105 12/05/2021 2253   CO2 20 (L) 12/05/2021 2253   GLUCOSE 93 12/05/2021 2253   BUN 7 12/05/2021 2253   BUN 7 12/01/2021 0920   CREATININE 0.59 12/05/2021 2253   CALCIUM 9.5 12/05/2021 2253   PROT 6.0 (L) 12/05/2021 2253   PROT 5.9 (L) 12/01/2021 0920   ALBUMIN 3.0 (L) 12/05/2021 2253   ALBUMIN 3.7 (L) 12/01/2021 0920   AST 11 (L) 12/05/2021 2253   ALT 12 12/05/2021 2253   ALKPHOS 68 12/05/2021 2253   BILITOT 0.2 (L) 12/05/2021 2253   BILITOT <0.2 12/01/2021 0920   GFRNONAA >60 12/05/2021 2253   GFRAA >60 06/24/2020 1846     Imaging Reviewed: MRI brain: no acute abnormalities MRV brain: normal venous flow without evidence of thrombosis  Assessment: 26 year old female who is [redacted] weeks pregnant, presented to the emergency room for evaluation of headaches and then had 2 seizures in the emergency department waiting lobby at MAU, was given magnesium, IV Ativan and IV Keppra load and is now slowly coming back to baseline. - Given her extensive  seizure history, breakthrough seizure versus nonepileptic seizure activity are both in consideration as far as the seizures are concerned. - Other concern that brought her in initially was headache that has been ongoing for 2 days. MRI brain without acute abnormality. No dural venous sinus thrombosis seen on MRV  - As MRI is negative for any potential cause of seizures or dural venous sinus thrombosis, will order LTM EEG to further characterize whether events are epileptic or nonepileptic seizures.  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 Kerney Elbe

## 2021-12-06 NOTE — Progress Notes (Signed)
vLTM started All imp below 10kohms.  [facility] to monitor.  Patient event button tested.

## 2021-12-06 NOTE — H&P (Signed)
History    CSN: [id number]   Arrival date and time: 12/05/21 2243    Event Date/Time   First Provider Initiated Contact with Patient 12/05/21 2310          Chief Complaint  Patient presents with   Headache   Hypertension    HPI Erin Krueger is a 26 y.o. [id number] at [redacted]w[redacted]d who presents to MAU with chief complaint of headache since the morning of 12/04/2021. Patient also reports spots in her field of vision, onset coinciding with onset of headache. Patient became unresponsive and began to actively seize shortly after registering in MAU. Health history is significant for seizures managed on Keppra. Patient states she has been seizure free since her discharge from [facility] on 11/22/2021. She took her morning dose of Keppra as prescribed this morning. She typically takes her bedtime does at 2330 and so has not yet taken it today.  She is accompanied by her fiance Zack. OB History       Gravida  7   Para  4   Term  3   Preterm  1   AB  2   Living  3        SAB  2   IAB      Ectopic      Multiple  0   Live Births  3                   Past Medical History:  Diagnosis Date   ADHD (attention deficit hyperactivity disorder)     Adopted person 10/04/2019    Does not know personal family history other than Diabetes, Breast Cancer. Raised in foster care   Allergy     Anxiety     Asthma     Eating disorder     Headache(784.0)     Kidney stone      kidney stones   Migraines     ODD (oppositional defiant disorder)     PID (acute pelvic inflammatory disease) 01/29/2016   PTSD (post-traumatic stress disorder)     Seizures ([place])      diagnosed age 22           Past Surgical History:  Procedure Laterality Date   right knee surgery       TONSILLECTOMY AND ADENOIDECTOMY               Family History  Problem Relation Age of Onset   Diabetes Mother     Diabetes Sister     Breast cancer Maternal Grandmother     Diabetes Maternal Grandmother     Glaucoma Maternal  Grandmother     Other Son          born with hole in heart   Other Daughter          born with hole in heart      Social History         Tobacco Use   Smoking status: Former      Packs/day: 0.50      Types: Cigarettes, E-cigarettes   Smokeless tobacco: Never   Tobacco comments:      nicotine patch  Vaping Use   Vaping Use: Former   Substances: Flavoring  Substance Use Topics   Alcohol use: Not Currently   Drug use: Not Currently      Types: Marijuana      Comment: Not since 02/13      Allergies:  Allergies  Allergen Reactions   Bee Venom Anaphylaxis   Penicillins Anaphylaxis      * Has tolerated several cephalosporins* Has patient had a PCN reaction causing immediate rash, facial/tongue/throat swelling, SOB or lightheadedness with hypotension: Unknown Has patient had a PCN reaction causing severe rash involving mucus membranes or skin necrosis: Unknown Has patient had a PCN reaction that required hospitalization: Unknown Has patient had a PCN reaction occurring within the last 10 years: Unknown If all of the above answers are "NO", then may proceed with Cephalosporin use.     Ativan [Lorazepam]        Aggressive-agitation   Prednisone Other (See Comments)      Bleeding nose bleeding and internal bleeding   Latex Rash             Medications Prior to Admission  Medication Sig Dispense Refill Last Dose   aspirin 81 MG EC tablet Take 2 tablets (162 mg total) by mouth daily. Swallow whole. 180 tablet 2     diphenhydrAMINE (BENADRYL) 25 mg capsule Take 25 mg by mouth every 6 (six) hours as needed.         levETIRAcetam (KEPPRA) 750 MG tablet Take 2 tablets (1,500 mg total) by mouth 2 (two) times daily. 360 tablet 4     magnesium oxide (MAG-OX) 400 (240 Mg) MG tablet Take 1 tablet (400 mg total) by mouth 2 (two) times daily. 180 tablet 4     nitrofurantoin, macrocrystal-monohydrate, (MACROBID) 100 MG capsule Take 1 capsule (100 mg total) by mouth at bedtime for 90  doses. 30 capsule 2     Prenatal Vit-Fe Fumarate-FA (PRENATAL VITAMIN PO) Take by mouth.            Review of Systems  Eyes:  Positive for photophobia and visual disturbance.  Neurological:  Positive for seizures and headaches.  All other systems reviewed and are negative. Physical Exam    unknown if currently breastfeeding.   Physical Exam Vitals and nursing note reviewed. Exam conducted with a chaperone present.  Constitutional:      General: She is in acute distress.  Cardiovascular:     Pulses: Normal pulses.  Pulmonary:     Effort: Pulmonary effort is normal.  Abdominal:     Comments: Gravid  Neurological:     Mental Status: She is unresponsive.      MAU Course  Procedures     --MAU staff called emergently by MAU registration staff. --CNM accompanied all MAU RNs to lobby where patient was found to be lying supine, head beneath waiting room chair, actively seizing.  --Patient assisted to right lateral, moved from underneath chair by CNM. Attending, RR RN, and House Coverage requested in MAU --Patient with 4-person assist to stretcher, moved to MAU exam room --Dr. Ilda Basset and all requested staff present a short time later. Orders placed per his assessment. Patient with new soft vocalizations.Magnesium Sulfate initiated for persistent severe range blood pressure. RR RN present in unit. Additional orders placed per her communications with Neurology --Reactive tracing: baseline 140, mod var, + 1 accels, no decels --Toco: not applied, no contractions palpated      Orders Placed This Encounter  Procedures   Resp Panel by RT-PCR (Flu A&B, Covid) Nasopharyngeal Swab   DG Chest Port 1 View   CBC   Comprehensive metabolic panel   Rapid urine drug screen (hospital performed)   Notify physician (specify) Confirmatory reading of BP> 160/110 15 minutes later   Strict intake and output  Measure blood pressure   Type and screen [place] peripheral  IV    Patient Vitals for the past 24 hrs:   BP Temp Temp src Pulse Resp SpO2  12/06/21 0000 129/88 97.8 F (36.6 C) Oral 96 20 98 %  12/05/21 2310 126/79 -- -- (!) 111 -- 99 %  12/05/21 2300 118/71 -- -- (!) 114 -- 97 %  12/05/21 2255 (!) 171/141 -- -- (!) 106 -- 98 %  12/05/21 2252 (!) 160/140 -- -- 83 -- --        Meds ordered this encounter  Medications   lactated ringers infusion   magnesium bolus via infusion 6 g   magnesium sulfate 40 grams in SWI 1000 mL OB infusion   DISCONTD: labetalol (NORMODYNE) injection 20 mg   DISCONTD: labetalol (NORMODYNE) injection 40 mg   DISCONTD: labetalol (NORMODYNE) injection 80 mg   DISCONTD: hydrALAZINE (APRESOLINE) injection 10 mg   betamethasone acetate-betamethasone sodium phosphate (CELESTONE) injection 12 mg   magnesium sulfate 40 grams in SWI 1000 mL 40 GM/1000ML infusion      Wilson, Kathrine M: cabinet override   ammonia inhalant      Elio Forget M: cabinet override   levETIRAcetam (KEPPRA) IVPB 1500 mg/ 100 mL premix   LORazepam (ATIVAN) injection 1 mg   LORazepam (ATIVAN) 2 MG/ML injection      Elio Forget M: cabinet override    Assessment and Plan  --26 y.o. [id number] at [redacted]w[redacted]d  --Reactive tracing --A&O x 4 s/p Magnesium Sulfate, IV Keppra and IV Ativan --Per Dr. Ilda Basset, admit to [facility] --Neurology inbound to bedside   Darlina Rumpf, [place] 12/05/2021, 12:08 AM   Above note copied from MAU note, reviewed by me. Currently in radiology for imaging Recent vitals Blood pressure 113/70, pulse (!) 108, temperature 98.1 F (36.7 C), temperature source Oral, resp. rate 18, height 5\' 6"  (1.676 m), weight 96 kg, SpO2 97 %, unknown if currently breastfeeding. Will f/u with neurology recommendations  Woodroe Mode, MD 12/06/2021 8:50 AM

## 2021-12-06 NOTE — Consult Note (Signed)
Neurology Consultation  Reason for Consult: Seizures Referring Physician: Dr. [place] Bing  CC: Seizures  History is obtained from: Chart, patient  HPI: Erin Krueger is a 26 y.o. female who has a past medical history of ADD, asthma, anxiety, headache, renal stones, oppositional defiant disorder, PID, PTSD, seizures and possible concern for pseudoseizures on top of that as well, G7 P3 123 presented to the MAU for evaluation of headache that has been going on for couple of days.  She had been seen in late December by the neurology team for breakthrough seizures and was discharged home on Keppra 1500 twice daily.  She has had multiple EEGs with no abnormalities.  Dr. Ronal Fear note from a few years ago mentions concern for nonepileptic events on top of possible underlying epilepsy which she has been diagnosed since the age of 11. She reports that she has been having a headache for 2 days which is on the top of her head, different than her usual migraines with no photophobia or phonophobia.  She denies any recent stressors. In the MAU, she had 2 seizures while waiting to be seen in the lobby. First seizure abated without meds and then she had another one that lasted 45 seconds. I was able to assess her about an hour after seizure and she still appeared somewhat groggy.  She had received a bolus of magnesium per OB/GYN, and on phone discussion with me on my recommendation-1 mg of Ativan and 1500 mg of Keppra IV. Currently continues to complain of some headache.   ROS: Unable to obtain due to altered mental status.   Past Medical History:  Diagnosis Date   ADHD (attention deficit hyperactivity disorder)    Adopted person 10/04/2019   Does not know personal family history other than Diabetes, Breast Cancer. Raised in foster care   Allergy    Anxiety    Asthma    Eating disorder    Headache(784.0)    Kidney stone    kidney stones   Migraines    ODD (oppositional defiant disorder)     PID (acute pelvic inflammatory disease) 01/29/2016   PTSD (post-traumatic stress disorder)    Seizures ([facility])    diagnosed age 5        Family History  Problem Relation Age of Onset   Diabetes Mother    Diabetes Sister    Breast cancer Maternal Grandmother    Diabetes Maternal Grandmother    Glaucoma Maternal Grandmother    Other Son        born with hole in heart   Other Daughter        born with hole in heart     Social History:   reports that she has quit smoking. Her smoking use included cigarettes and e-cigarettes. She smoked an average of .5 packs per day. She has never used smokeless tobacco. She reports that she does not currently use alcohol. She reports that she does not currently use drugs after having used the following drugs: Marijuana.  Medications  Current Facility-Administered Medications:    acetaminophen (TYLENOL) tablet 650 mg, 650 mg, Oral, Q4H PRN, [place] Bing, MD   betamethasone acetate-betamethasone sodium phosphate (CELESTONE) injection 12 mg, 12 mg, Intramuscular, Q24 Hr x 2, [place]in[place], Samantha C, CNM, 12 mg at 12/05/21 2312   famotidine (PEPCID) IVPB 20 mg premix, 20 mg, Intravenous, Once, [place] Bing, MD, Last Rate: 100 mL/hr at 12/06/21 0038, 20 mg at 12/06/21 0038   [START ON 12/07/2021] levETIRAcetam (KEPPRA) IVPB 1500  mg/ 100 mL premix, 1,500 mg, Intravenous, Q12H, Milon DikesArora, Tashanda Fuhrer, MD   Exam: Current vital signs: BP 123/75 (BP Location: Left Arm)    Pulse (!) 108    Temp 97.9 F (36.6 C) (Oral)    Resp 16    LMP  (LMP Unknown)    SpO2 96%  Vital signs in last 24 hours: Temp:  [97.8 F (36.6 C)-97.9 F (36.6 C)] 97.9 F (36.6 C) (01/14 0016) Pulse Rate:  [83-114] 108 (01/14 0102) Resp:  [16-20] 16 (01/14 0102) BP: (113-171)/(71-141) 123/75 (01/14 0102) SpO2:  [96 %-99 %] 96 % (01/14 0102) General: Somewhat drowsy but opens eyes to voice and participates in the exam HEENT: Normocephalic/atraumatic Lungs: Clear Cardiovascular: Regular  rate rhythm Abdomen gravid Extremities with no edema Neurological exam She is somewhat drowsy but opens eyes to voice.  Participates in the exam. Slow to respond to questions. No dysarthria or aphasia Cranial nerves II to XII intact Motor examination with no drift in any of the 4 extremities upon coaching.  Initially had trouble lifting both legs against gravity above the bed.  With coaching was able to hold both of them up for 5 seconds.  Definitely some effort dependent weakness bilaterally, which was quickly overcome by coaching. Sensation intact light touch all over Coordination: No dysmetria NIH stroke scale 1 for mental status  Labs I have reviewed labs in epic and the results pertinent to this consultation are:  CBC    Component Value Date/Time   WBC 18.5 (H) 12/05/2021 2253   RBC 3.60 (L) 12/05/2021 2253   HGB 11.4 (L) 12/05/2021 2253   HGB 11.4 12/01/2021 0835   HCT 33.0 (L) 12/05/2021 2253   HCT 34.4 12/01/2021 0835   PLT 238 12/05/2021 2253   PLT 239 12/01/2021 0835   MCV 91.7 12/05/2021 2253   MCV 92 12/01/2021 0835   MCH 31.7 12/05/2021 2253   MCHC 34.5 12/05/2021 2253   RDW 11.9 12/05/2021 2253   RDW 11.5 (L) 12/01/2021 0835   LYMPHSABS 2.7 08/12/2021 1654   MONOABS 0.4 12/15/2020 2044   EOSABS 0.1 08/12/2021 1654   BASOSABS 0.0 08/12/2021 1654    CMP     Component Value Date/Time   NA 138 12/05/2021 2253   NA 139 12/01/2021 0920   K 3.7 12/05/2021 2253   CL 105 12/05/2021 2253   CO2 20 (L) 12/05/2021 2253   GLUCOSE 93 12/05/2021 2253   BUN 7 12/05/2021 2253   BUN 7 12/01/2021 0920   CREATININE 0.59 12/05/2021 2253   CALCIUM 9.5 12/05/2021 2253   PROT 6.0 (L) 12/05/2021 2253   PROT 5.9 (L) 12/01/2021 0920   ALBUMIN 3.0 (L) 12/05/2021 2253   ALBUMIN 3.7 (L) 12/01/2021 0920   AST 11 (L) 12/05/2021 2253   ALT 12 12/05/2021 2253   ALKPHOS 68 12/05/2021 2253   BILITOT 0.2 (L) 12/05/2021 2253   BILITOT <0.2 12/01/2021 0920   GFRNONAA >60 12/05/2021  2253   GFRAA >60 06/24/2020 1846    Lipid Panel     Component Value Date/Time   CHOL 101 08/09/2013 0620   TRIG 121 08/09/2013 0620   HDL 55 08/09/2013 0620   CHOLHDL 1.8 08/09/2013 0620   VLDL 24 08/09/2013 0620   LDLCALC 22 08/09/2013 0620     Imaging I have reviewed the images obtained:  CT-head-last obtained July 2021 with no intracranial abnormality.  Moderate ethmoid sinus mucosal thickening with small mucous retention cyst within the ethmoid, right sphenoid and bilateral maxillary  sinuses.  Assessment:  26 year old woman who is [redacted] weeks pregnant, presented to the emergency room for evaluation of headaches and then had 2 seizures in the emergency department waiting lobby at MAU, was given magnesium, IV Ativan and IV Keppra load and is now slowly coming back to baseline. Given her extensive seizure history, breakthrough seizure versus nonepileptic seizure activity are both in consideration as far as the seizures are concerned. Other concerned that brought her in initially was headache that has been ongoing for 2 days. Given her pregnancy, would recommend brain imaging-would avoid radiation with CT and would get MRI of the brain and MRV of the head without contrast to rule out any structural abnormality, bleed or dural venous sinus thrombus.  Recommendations: MRI brain without contrast MRV head without contrast Keppra 1500 twice daily-if she is cleared for p.o., can change the IV dose to p.o. dose-one-to-one conversion Seizure precautions Management of blood pressures per primary team Routine EEG in the morning Neurology will follow with you Plan discussed with Dr. Vergie Living over the phone Plan also discussed with the patient at bedside   -- Milon Dikes, MD Neurologist [facility] Pager: [phone]

## 2021-12-06 NOTE — Progress Notes (Signed)
Magnesium d/c'd at 0212 after confirming order for discontinuance with Dr Ilda Basset.

## 2021-12-06 NOTE — Discharge Summary (Signed)
Antenatal Physician Discharge Summary  Patient ID: Erin Krueger MRN: [id number] DOB/AGE: 25-25-1997 25 y.o.  Admit date: 12/05/2021 Discharge date: 12/06/2021  Admission Diagnoses: Seizure disorder Pseudoseizures Pregnancy at [redacted] weeks Gestational Hypertension Recurrent UTI  Discharge Diagnoses:  Seizure disorder Pseudoseizures Pregnancy at [redacted] weeks Gestational Hypertension Recurrent UTI Left against medical advice   Prenatal Procedures: EEG, MRI/MRV,   Consults: Neurology  Hospital Course:  Erin Krueger is a 26 y.o. [phone] with IUP at 73w1dadmitted for seizure activity witnessed in the MAU. She was admitted and started on her home medication of Keppra [id number]. She had taken it as usual. She was admitted to antepartum where neurology was consulted and saw her. She had MRI/MRV which was normal. She was then to start an overnight EEG and this was started but then she decided she wished to leave because she "misses her children. She has not been away from them for more than a day".  She was tearful during this discussion. I recommended she remain for the work up for her seizures. She reported she was told they were pseudoseizures, however she and I discussed that the EEG's purpose was to help evaluate this. We discussed the risks of leaving AMA both with regards to her health and the health of the fetus. We discussed the risk of seizure activity on a pregnancy namely that during that time oxygenation to the pregnancy may be limited and she may be risking stillbirth. We discussed that she may be putting her life at risk given her neurologic symptoms that have been incompletely worked up. I recommended she stay at least through the completion of the EEG. The patient will not stay. The patient reports she understands the risks and does not wish to stay.  She would like to leave AMA. She signed the [facility]. We gave her the second dose of bethamethasone prior to discharge.   I  reviewed her medications and ensured she has all the medications she needs. We discussed her follow up and she has appointments made (confirmed in Epic) with both our practice and neurology. We discussed that she may return at any point for continued care. She should return if she has more seizures, bleeding, decreased fetal movement, acute pain, or any other obstetric concerns or if she has any symptoms of preeclampsia such at HA/BV/RUQ pain that don't alleviate with usual measures.   Discharge Exam: Temp:  [97.8 F (36.6 C)-98.7 F (37.1 C)] 98.7 F (37.1 C) (01/14 1955) Pulse Rate:  [83-114] 106 (01/14 1955) Resp:  [16-20] 20 (01/14 1955) BP: (113-171)/(61-141) 127/83 (01/14 1955) SpO2:  [93 %-99 %] 99 % (01/14 1955) Weight:  [96 kg] 96 kg (01/14 0253)  Physical Examination: CONSTITUTIONAL: Well-developed, well-nourished female in no acute distress.  HENT:  Normocephalic, atraumatic, External right and left ear normal.  EYES: Conjunctivae and EOM are normal. Pupils are equal, round, and reactive to light. No scleral icterus.  NECK: Normal range of motion, supple, no masses SKIN: Skin is warm and dry. No rash noted. Not diaphoretic. No erythema. No pallor. NEUROLOGIC: Alert and oriented to person, place, and time. Normal reflexes, muscle tone coordination. No cranial nerve deficit noted. PSYCHIATRIC: Normal mood and affect. Normal behavior. Normal judgment and thought content. CARDIOVASCULAR: Normal heart rate noted, regular rhythm RESPIRATORY: Effort and breath sounds normal, no problems with respiration noted MUSCULOSKELETAL: Normal range of motion. No edema and no tenderness. 2+ distal pulses. ABDOMEN: Soft, nontender, nondistended, gravid. CERVIX:    Fetal monitoring:  FHR: 140 bpm, Variability: moderate, Accelerations: Present, Decelerations: Absent  Uterine activity: no contractions  Significant Diagnostic Studies:  Results for orders placed or performed during the hospital  encounter of 12/05/21 (from the past 168 hour(s))  Rapid urine drug screen (hospital performed)   Collection Time: 12/05/21  9:35 AM  Result Value Ref Range   Opiates NONE DETECTED NONE DETECTED   Cocaine NONE DETECTED NONE DETECTED   Benzodiazepines NONE DETECTED NONE DETECTED   Amphetamines NONE DETECTED NONE DETECTED   Tetrahydrocannabinol NONE DETECTED NONE DETECTED   Barbiturates NONE DETECTED NONE DETECTED  CBC   Collection Time: 12/05/21 10:53 PM  Result Value Ref Range   WBC 18.5 (H) 4.0 - 10.5 K/uL   RBC 3.60 (L) 3.87 - 5.11 MIL/uL   Hemoglobin 11.4 (L) 12.0 - 15.0 g/dL   HCT 33.0 (L) 36.0 - 46.0 %   MCV 91.7 80.0 - 100.0 fL   MCH 31.7 26.0 - 34.0 pg   MCHC 34.5 30.0 - 36.0 g/dL   RDW 11.9 11.5 - 15.5 %   Platelets 238 150 - 400 K/uL   nRBC 0.0 0.0 - 0.2 %  Comprehensive metabolic panel   Collection Time: 12/05/21 10:53 PM  Result Value Ref Range   Sodium 138 135 - 145 mmol/L   Potassium 3.7 3.5 - 5.1 mmol/L   Chloride 105 98 - 111 mmol/L   CO2 20 (L) 22 - 32 mmol/L   Glucose, Bld 93 70 - 99 mg/dL   BUN 7 6 - 20 mg/dL   Creatinine, Ser 0.59 0.44 - 1.00 mg/dL   Calcium 9.5 8.9 - 10.3 mg/dL   Total Protein 6.0 (L) 6.5 - 8.1 g/dL   Albumin 3.0 (L) 3.5 - 5.0 g/dL   AST 11 (L) 15 - 41 U/L   ALT 12 0 - 44 U/L   Alkaline Phosphatase 68 38 - 126 U/L   Total Bilirubin 0.2 (L) 0.3 - 1.2 mg/dL   GFR, Estimated >60 >60 mL/min   Anion gap 13 5 - 15  Type and screen [place]   Collection Time: 12/05/21 10:55 PM  Result Value Ref Range   ABO/RH(D) O POS    Antibody Screen NEG    Sample Expiration      12/08/2021,2359 Performed at [facility], [address] [place], [place] [address]   Resp Panel by RT-PCR (Flu A&B, Covid) Urine, Clean Catch   Collection Time: 12/05/21 10:56 PM   Specimen: Urine, Clean Catch; Nasopharyngeal(NP) swabs in vial transport medium  Result Value Ref Range   SARS Coronavirus 2 by RT PCR NEGATIVE NEGATIVE    Influenza A by PCR NEGATIVE NEGATIVE   Influenza B by PCR NEGATIVE NEGATIVE  Results for orders placed or performed in visit on 12/01/21 (from the past 168 hour(s))  HIV Antibody (routine testing w rflx)   Collection Time: 12/01/21  8:35 AM  Result Value Ref Range   HIV Screen 4th Generation wRfx Non Reactive Non Reactive  CBC   Collection Time: 12/01/21  8:35 AM  Result Value Ref Range   WBC 13.8 (H) 3.4 - 10.8 x10E3/uL   RBC 3.73 (L) 3.77 - 5.28 x10E6/uL   Hemoglobin 11.4 11.1 - 15.9 g/dL   Hematocrit 34.4 34.0 - 46.6 %   MCV 92 79 - 97 fL   MCH 30.6 26.6 - 33.0 pg   MCHC 33.1 31.5 - 35.7 g/dL   RDW 11.5 (L) 11.7 - 15.4 %   Platelets  239 150 - 450 x10E3/uL  RPR   Collection Time: 12/01/21  8:35 AM  Result Value Ref Range   RPR Ser Ql Non Reactive Non Reactive  Antibody screen   Collection Time: 12/01/21  8:35 AM  Result Value Ref Range   Antibody Screen Negative Negative  Glucose Tolerance, 2 Hours w/1 Hour   Collection Time: 12/01/21  8:36 AM  Result Value Ref Range   Glucose, Fasting 82 70 - 91 mg/dL   Glucose, 1 hour 127 70 - 179 mg/dL   Glucose, 2 hour 59 (L) 70 - 152 mg/dL  Results for orders placed or performed in visit on 12/01/21 (from the past 168 hour(s))  Comprehensive metabolic panel   Collection Time: 12/01/21  9:20 AM  Result Value Ref Range   Glucose 129 (H) 70 - 99 mg/dL   BUN 7 6 - 20 mg/dL   Creatinine, Ser 0.55 (L) 0.57 - 1.00 mg/dL   eGFR 131 >59 mL/min/1.73   BUN/Creatinine Ratio 13 9 - 23   Sodium 139 134 - 144 mmol/L   Potassium 3.5 3.5 - 5.2 mmol/L   Chloride 105 96 - 106 mmol/L   CO2 19 (L) 20 - 29 mmol/L   Calcium 8.5 (L) 8.7 - 10.2 mg/dL   Total Protein 5.9 (L) 6.0 - 8.5 g/dL   Albumin 3.7 (L) 3.9 - 5.0 g/dL   Globulin, Total 2.2 1.5 - 4.5 g/dL   Albumin/Globulin Ratio 1.7 1.2 - 2.2   Bilirubin Total <0.2 0.0 - 1.2 mg/dL   Alkaline Phosphatase 82 44 - 121 IU/L   AST 10 0 - 40 IU/L   ALT 9 0 - 32 IU/L   MR BRAIN WO CONTRAST  Result  Date: 12/06/2021 CLINICAL DATA:  Headache, new or worsening (pregnant) EXAM: MRI HEAD WITHOUT CONTRAST TECHNIQUE: Multiplanar, multiecho pulse sequences of the brain and surrounding structures were obtained without intravenous contrast. COMPARISON:  06/21/2020 FINDINGS: Brain: No acute infarction, hemorrhage, hydrocephalus, extra-axial collection or mass lesion. No white matter disease. Vascular: Normal flow voids. Skull and upper cervical spine: Normal marrow signal Sinuses/Orbits: Chronic, mild generalized mucosal thickening in paranasal sinuses. Negative orbits IMPRESSION: Normal MRI of the brain. Electronically Signed   By: Jorje Guild M.D.   On: 12/06/2021 08:36   DG Chest Port 1 View  Result Date: 12/05/2021 CLINICAL DATA:  Pregnant patient status post seizure. EXAM: PORTABLE CHEST 1 VIEW COMPARISON:  December 04, 2018 FINDINGS: The heart size and mediastinal contours are within normal limits. Both lungs are clear. The visualized skeletal structures are unremarkable. IMPRESSION: No active disease. Electronically Signed   By: Virgina Norfolk M.D.   On: 12/05/2021 23:59   DG Shoulder Right Portable  Result Date: 11/21/2021 CLINICAL DATA:  Fall.  Right shoulder pain. EXAM: PORTABLE RIGHT SHOULDER COMPARISON:  None. FINDINGS: Single view of the right shoulder shows no evidence of a fracture. Glenohumeral and AC joints appear normally spaced and aligned. Surrounding soft tissues are unremarkable. IMPRESSION: Negative. Electronically Signed   By: Lajean Manes M.D.   On: 11/21/2021 19:53   Overnight EEG with video  Result Date: 11/22/2021 Lora Havens, MD     11/22/2021  2:04 PM Patient Name: JOLISA INTRIAGO MRN: [id number] Epilepsy Attending: Lora Havens Referring Physician/Provider: Dr Kerney Elbe Duration: 11/21/2021 2137 to 11/22/2021 1227  Patient history: 26 year old female, [redacted] weeks pregnant, with a history of epilepsy since childhood, who presents with breakthrough seizures. EEG  to evaluate for seizure  Level  of alertness: Awake, asleep  AEDs during EEG study: LEV  Technical aspects: This EEG study was done with scalp electrodes positioned according to the 10-20 International system of electrode placement. Electrical activity was acquired at a sampling rate of _0  and reviewed with a high frequency filter of _1  and a low frequency filter of _2 . EEG data were recorded continuously and digitally stored.  Description: No clear posterior dominant rhythm was seen. Sleep was characterized by vertex waves, sleep spindles (12 to 14 Hz), maximal frontocentral region.  EEG showed an excessive amount of 15 to 18 Hz,  beta activity distributed symmetrically and diffusely. Hyperventilation and photic stimulation were not performed.    ABNORMALITY - Excessive beta, generalized  IMPRESSION: This study is within normal limits. No seizures or epileptiform discharges were seen throughout the recording.  The excessive beta activity seen in the background is most likely due to the effect of benzodiazepine and is a benign EEG pattern. Lora Havens   MR MRV HEAD WO CM  Result Date: 12/06/2021 CLINICAL DATA:  Headache and pregnancy, rule out dural venous sinus thrombosis. EXAM: MR VENOGRAM HEAD WITHOUT CONTRAST TECHNIQUE: Angiographic images of the intracranial venous structures were acquired using MRV technique without intravenous contrast. COMPARISON:  Noncontrast brain MRI from earlier the same day. FINDINGS: Reformats were generated and reviewed in PACS. Robust flow in the dural sinuses and paired deep veins. No evidence of cortical vein thrombosis. IMPRESSION: Normal MRV. Electronically Signed   By: Jorje Guild M.D.   On: 12/06/2021 08:58   [place] MFM OB FOLLOW UP  Result Date: 12/01/2021 ----------------------------------------------------------------------  OBSTETRICS REPORT                       (Signed Final 12/01/2021 11:59 am)  ---------------------------------------------------------------------- Patient Info  ID #:       [id number]                          D.O.B.:  November 03, 1996 (25 yrs)  Name:       Erin Krueger                Visit Date: 12/01/2021 09:53 am ---------------------------------------------------------------------- Performed By  Attending:        Tama High MD        Ref. Address:     [address]                                                             [place], [place]  Performed By:     Georgie Chard        [facility]                                                             [place] [facility]                                                             [facility]  Referred By:      [facility]                    for [facility] ---------------------------------------------------------------------- Orders  #  Description                           Code        Ordered By  1  [place] MFM OB FOLLOW UP                   [phone]    Peterson Ao ----------------------------------------------------------------------  #  Order #                     Accession #                Episode #  1  [id number]                   [id number]                 [id number] ---------------------------------------------------------------------- Indications  Seizure disorder (Keppra)                      O99.350 G40.909  Prior poor obstetrical history antepartum,     O09.292  second trimester (term IUFD)  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 Cord Insertion:      Visualized  Amniotic Fluid  AFI FV:      Within normal limits  AFI Sum(cm)     %Tile       Largest Pocket(cm)  14.3            48          4  RUQ(cm)       RLQ(cm)       LUQ(cm)        LLQ(cm)  4  3.04          3.53           3.73 ---------------------------------------------------------------------- Biometry  BPD:      71.1  mm     G. Age:  28w 4d         42  %    CI:         69.6   %    70 - 86                                                          FL/HC:      18.8   %    18.8 - 20.6  HC:       272   mm     G. Age:  29w 5d         57  %    HC/AC:      1.07        1.05 - 1.21  AC:      255.2  mm     G. Age:  29w 5d         80  %    FL/BPD:     71.7   %    71 - 87  FL:         51  mm     G. Age:  27w 2d         10  %    FL/AC:      20.0   %    20 - 24  HUM:      49.4  mm     G. Age:  29w 0d         70  %  LV:        6.4  mm  Est. FW:    1292  gm    2 lb 14 oz      53  % ---------------------------------------------------------------------- OB History  Gravidity:    7         Term:   3        Prem:   1        SAB:   2  TOP:          0       Ectopic:  0        Living: 3 ---------------------------------------------------------------------- Gestational Age  U/S Today:     28w 6d                                        EDD:   02/17/22  Best:          Timothy Lasso 3d     Det. ByLoman Chroman         EDD:   02/20/22                                      (07/18/21) ---------------------------------------------------------------------- Anatomy  Cranium:               Appears normal         LVOT:  Previously seen  Cavum:                 Previously seen        Aortic Arch:            Previously seen  Ventricles:            Appears normal         Ductal Arch:            Previously seen  Choroid Plexus:        Previously seen         Diaphragm:              Appears normal  Cerebellum:            Previously seen        Stomach:                Appears normal, left                                                                        sided  Posterior Fossa:       Previously seen        Abdomen:                Previously seen  Nuchal Fold:           Not applicable (>73    Abdominal Wall:         Previously seen                         wks GA)  Face:                  Orbits and profile     Cord Vessels:           Previously seen                         previously seen  Lips:                  Previously seen        Kidneys:                Appear normal  Palate:                Previously seen        Bladder:                Appears normal  Thoracic:              Appears normal         Spine:                  Previously seen  Heart:                 Appears normal         Upper Extremities:      Previously seen                         (4CH, axis, and  situs)  RVOT:                  Previously seen        Lower Extremities:      Previously seen  Other:  VC, 3VV and 3VTV previously visualized. Heels/feet and open          hands/5th digits previously visualized. Lenses, nasal bone, maxilla          and mandible previously visualized. ---------------------------------------------------------------------- Cervix Uterus Adnexa  Cervix  Length:           4.19  cm.  Normal appearance by transabdominal scan. ---------------------------------------------------------------------- Impression  History of stillbirth followed by 3 term vaginal deliveries and  these infants are alive and well.  Patient has screening for  gestational diabetes today.  Blood pressure today at her  office is 136/77 mmHg.  Fetal growth is appropriate for gestational age .Amniotic fluid  is normal and good fetal activity is seen . ---------------------------------------------------------------------- Recommendations  -An appointment was made for her to return in 4 weeks  for  fetal growth assessment and BPP.  -Weekly BPP from [redacted] weeks gestation till delivery and this  may be performed at your office or at our center. ----------------------------------------------------------------------                  Tama High, MD Electronically Signed Final Report   12/01/2021 11:59 am ----------------------------------------------------------------------   Future Appointments  Date Time Provider [place]  12/09/2021  8:55 AM Clarnce Flock, MD [facility]  12/16/2021  8:15 AM Renard Matter, MD [facility]  12/18/2021 10:45 AM [facility]  12/24/2021  8:55 AM Woodroe Mode, MD [facility]  12/24/2021  1:15 PM Alric Ran, MD [facility] None  12/31/2021  8:15 AM Radene Gunning, MD [facility]  12/31/2021  8:45 AM [facility] NURSE [facility]  12/31/2021  9:00 AM [facility] US1 [facility]  01/07/2022  8:15 AM Caren Macadam, MD [facility]  01/14/2022  8:15 AM Radene Gunning, MD [facility]  01/21/2022  8:15 AM Harolyn Rutherford, Sallyanne Havers, MD [facility]  01/28/2022  8:15 AM Clarnce Flock, MD [facility]    Discharge Condition: Stable  There are no questions and answers to display.         Allergies as of 12/06/2021       Reactions   Bee Venom Anaphylaxis   Penicillins Anaphylaxis   * Has tolerated several cephalosporins* Has patient had a PCN reaction causing immediate rash, facial/tongue/throat swelling, SOB or lightheadedness with hypotension: Unknown Has patient had a PCN reaction causing severe rash involving mucus membranes or skin necrosis: Unknown Has patient had a PCN reaction that required hospitalization: Unknown Has patient had a PCN reaction occurring within the last 10 years: Unknown If all of the above answers are "NO", then may proceed with Cephalosporin use.   Ativan [lorazepam]    Aggressive-agitation   Prednisone Other (See Comments)   Bleeding nose bleeding and internal bleeding   Latex Rash         Medication List     TAKE these medications    aspirin 81 MG EC tablet Take 2 tablets (162 mg total) by mouth daily. Swallow whole.   butalbital-acetaminophen-caffeine 50-325-40 MG tablet Commonly known as: FIORICET Take 1 tablet by mouth every 6 (six) hours as needed for headache.   diphenhydrAMINE 25 mg capsule Commonly known as: BENADRYL Take 25 mg by mouth every 6 (six) hours as needed.  levETIRAcetam 750 MG tablet Commonly known as: KEPPRA Take 2 tablets (1,500 mg total) by mouth 2 (two) times daily.   magnesium oxide 400 (240 Mg) MG tablet Commonly known as: MAG-OX Take 1 tablet (400 mg total) by mouth 2 (two) times daily.   nitrofurantoin (macrocrystal-monohydrate) 100 MG capsule Commonly known as: Macrobid Take 1 capsule (100 mg total) by mouth at bedtime for 90 doses.   PRENATAL VITAMIN PO Take by mouth.         Total discharge time: 45 minutes   Signed: Radene Gunning M.D. 12/06/2021, 9:17 PM

## 2021-12-07 NOTE — Procedures (Signed)
[facility]  Routine EEG Report  Duration: 21 min  Patient Name:   Erin Krueger, Erin Krueger Date of Birth:   1996-09-25 Identification Number:   MRN - [id number]  Date of Study:   12/07/2021 12:25:43  Indication: Spells, Eval for Seizures,  Technical Summary: A routine 20 channel electroencephalogram using the international 10-20 system of electrode placement was performed.  Background: 9-10 Hz, Posterior dominant rhythm that attenuated with eye Opening  States       Awake   Activation Procedures  Hyperventilation: Performed :  Photic Stimulation:  Classification: Normal : There were no Epileptiform discharges  Diagnosis: Normal Awake study. There are no epileptiform discharges.  Clinical Correlation: This is a normal study. The absence of interictal epileptiform abnormalities does not exclude the diagnosis of a seizure disorder.      Dr Joice Lofts   [facility] [phone]  Case [id number]

## 2021-12-07 NOTE — Procedures (Signed)
EEG Procedure CPT/Type of Study: [id number]; 2-12hr EEG with video Referring Provider: OBGYN Primary Neurological Diagnosis: shaking spells  History: This is a 26 yr old patient, undergoing an EEG to evaluate for spells of shaking. Clinical State: alert and oriented  Technical Description:  The EEG was performed using standard setting per the guidelines of American Clinical Neurophysiology Society (ACNS).  A minimum of 21 electrodes were placed on scalp according to the International 10-20 or/and 10-10 Systems. Supplemental electrodes were placed as needed. Single EKG electrode was also used to detect cardiac arrhythmia. Patient's behavior was continuously recorded on video simultaneously with EEG. A minimum of 16 channels were used for data display. Each epoch of study was reviewed manually daily and as needed using standard referential and bipolar montages. Computerized quantitative EEG analysis (such as compressed spectral array analysis, trending, automated spike & seizure detection) were used as indicated.   Day 1: from 1856 12/06/21 to 2057 12/06/21  EEG Description: Overall Amplitude:Normal Predominant Frequency: The background activity showed posterior dominant alpha, with about 10 Hz, that was frequent. Superimposed Frequencies: occasional theta and some beta activity bilaterally The background was symmetric  Background Abnormalities: None Rhythmic or periodic pattern: No Epileptiform activity: no Electrographic seizures: no Events: no   Breach rhythm: no  Reactivity: Present  Stimulation procedures:  Hyperventilation: not done Photic stimulation: not done  Sleep Background: Stage II  EKG:no significant arrhythmia  Impression: This was a normal continuous video EEG without epileptiform discharges or seizures.

## 2021-12-09 ENCOUNTER — Encounter: Payer: Self-pay | Admitting: Family Medicine

## 2021-12-09 ENCOUNTER — Other Ambulatory Visit: Payer: Medicaid Other

## 2021-12-09 ENCOUNTER — Other Ambulatory Visit: Payer: Self-pay

## 2021-12-09 ENCOUNTER — Ambulatory Visit (INDEPENDENT_AMBULATORY_CARE_PROVIDER_SITE_OTHER): Payer: Medicaid Other | Admitting: Family Medicine

## 2021-12-09 VITALS — BP 126/80 | HR 104 | Wt 215.7 lb

## 2021-12-09 DIAGNOSIS — Z5941 Food insecurity: Secondary | ICD-10-CM

## 2021-12-09 DIAGNOSIS — O133 Gestational [pregnancy-induced] hypertension without significant proteinuria, third trimester: Secondary | ICD-10-CM

## 2021-12-09 DIAGNOSIS — R569 Unspecified convulsions: Secondary | ICD-10-CM

## 2021-12-09 DIAGNOSIS — Z23 Encounter for immunization: Secondary | ICD-10-CM

## 2021-12-09 DIAGNOSIS — O09299 Supervision of pregnancy with other poor reproductive or obstetric history, unspecified trimester: Secondary | ICD-10-CM

## 2021-12-09 DIAGNOSIS — Z8279 Family history of other congenital malformations, deformations and chromosomal abnormalities: Secondary | ICD-10-CM

## 2021-12-09 DIAGNOSIS — O0990 Supervision of high risk pregnancy, unspecified, unspecified trimester: Secondary | ICD-10-CM

## 2021-12-09 DIAGNOSIS — F319 Bipolar disorder, unspecified: Secondary | ICD-10-CM

## 2021-12-09 NOTE — Patient Instructions (Signed)

## 2021-12-09 NOTE — Progress Notes (Signed)
° °  Subjective:  Erin Krueger is a 26 y.o. [phone] at [redacted]w[redacted]d being seen today for ongoing prenatal care.  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  Patient reports no complaints.  Contractions: Not present. Vag. Bleeding: None.  Movement: Present. Denies leaking of fluid.   The following portions of the patient's history were reviewed and updated as appropriate: allergies, current medications, past family history, past medical history, past social history, past surgical history and problem list. Problem list updated.  Objective:   Vitals:   12/09/21 0911  BP: 126/80  Pulse: (!) 104  Weight: 215 lb 11.2 oz (97.8 kg)    Fetal Status: Fetal Heart Rate (bpm): 175   Movement: Present     General:  Alert, oriented and cooperative. Patient is in no acute distress.  Skin: Skin is warm and dry. No rash noted.   Cardiovascular: Normal heart rate noted  Respiratory: Normal respiratory effort, no problems with respiration noted  Abdomen: Soft, gravid, appropriate for gestational age. Pain/Pressure: Absent     Pelvic: Vag. Bleeding: None     Cervical exam deferred        Extremities: Normal range of motion.  Edema: None  Mental Status: Normal mood and affect. Normal behavior. Normal judgment and thought content.   Urinalysis:      Assessment and Plan:  Pregnancy: [id number] at [redacted]w[redacted]d  1.  Supervision of high risk pregnancy, antepartum BP and FHR normal  2. Mild food insecurity on [place] [employer] - AMBULATORY REFERRAL TO [place]  3. Gestational hypertension, third trimester Diagnosed last visit Normotensive today UPCR not yet resulted, will send again Has MFM follow up scan on 12/31/21 Needs to start weekly testing, discussed with her, will book for next available  4. Pseudoseizures ([place]) Recently left AMA from hospitalization on 12/05/2021 for seizure activity Reports she is taking Keppra  5. Hx of preeclampsia, prior pregnancy, currently pregnant On ASA  6. H/o child with congenital heart defect S/p normal fetal echo  7. Bipolar 1 disorder ([place]) Reports not a correct diagnosis Not on any meds  Preterm labor symptoms and general obstetric precautions including but not limited to vaginal bleeding, contractions, leaking of fluid and fetal movement were reviewed in detail with the patient. Please refer to After Visit Summary for other counseling recommendations.  Return in 2 weeks (on 12/23/2021) for [facility], ob visit, needs MD.   Clarnce Flock, MD

## 2021-12-10 ENCOUNTER — Encounter: Payer: Self-pay | Admitting: Obstetrics and Gynecology

## 2021-12-10 LAB — PROTEIN / CREATININE RATIO, URINE
Creatinine, Urine: 88.3 mg/dL
Protein, Ur: 19.7 mg/dL
Protein/Creat Ratio: 223 mg/g creat — ABNORMAL HIGH (ref 0–200)

## 2021-12-16 ENCOUNTER — Telehealth: Payer: Self-pay

## 2021-12-16 ENCOUNTER — Encounter: Payer: Medicaid Other | Admitting: Family Medicine

## 2021-12-16 ENCOUNTER — Encounter (HOSPITAL_COMMUNITY): Payer: Self-pay | Admitting: Obstetrics and Gynecology

## 2021-12-16 ENCOUNTER — Other Ambulatory Visit: Payer: Self-pay

## 2021-12-16 ENCOUNTER — Other Ambulatory Visit: Payer: Medicaid Other

## 2021-12-16 ENCOUNTER — Inpatient Hospital Stay (HOSPITAL_COMMUNITY)
Admission: AD | Admit: 2021-12-16 | Discharge: 2021-12-16 | Disposition: A | Discharge status: Home / Self Care | Payer: Medicaid Other | Attending: Obstetrics and Gynecology | Admitting: Obstetrics and Gynecology

## 2021-12-16 DIAGNOSIS — G43909 Migraine, unspecified, not intractable, without status migrainosus: Secondary | ICD-10-CM | POA: Insufficient documentation

## 2021-12-16 DIAGNOSIS — R509 Fever, unspecified: Secondary | ICD-10-CM | POA: Diagnosis not present

## 2021-12-16 DIAGNOSIS — O0990 Supervision of high risk pregnancy, unspecified, unspecified trimester: Secondary | ICD-10-CM

## 2021-12-16 DIAGNOSIS — G43809 Other migraine, not intractable, without status migrainosus: Secondary | ICD-10-CM | POA: Diagnosis not present

## 2021-12-16 DIAGNOSIS — O1493 Unspecified pre-eclampsia, third trimester: Secondary | ICD-10-CM | POA: Diagnosis present

## 2021-12-16 DIAGNOSIS — Z3A30 30 weeks gestation of pregnancy: Secondary | ICD-10-CM | POA: Insufficient documentation

## 2021-12-16 LAB — URINALYSIS, ROUTINE W REFLEX MICROSCOPIC
Bilirubin Urine: NEGATIVE
Glucose, UA: NEGATIVE mg/dL
Ketones, ur: NEGATIVE mg/dL
Nitrite: NEGATIVE
Protein, ur: NEGATIVE mg/dL
Specific Gravity, Urine: 1.01 (ref 1.005–1.030)
pH: 7 (ref 5.0–8.0)

## 2021-12-16 MED ORDER — BLOOD PRESSURE KIT DEVI: 1.0000 | Freq: Once | 0 refills | Status: AC

## 2021-12-16 MED ORDER — DIPHENHYDRAMINE HCL 50 MG/ML IJ SOLN
25.0000 mg | Freq: Once | INTRAMUSCULAR | Status: AC
  Administered 2021-12-16: 21:00:00 25 mg via INTRAVENOUS
  Filled 2021-12-16: qty 1

## 2021-12-16 MED ORDER — METOCLOPRAMIDE HCL 5 MG/ML IJ SOLN
5.0000 mg | Freq: Once | INTRAMUSCULAR | Status: AC
  Administered 2021-12-16: 21:00:00 5 mg via INTRAVENOUS
  Filled 2021-12-16: qty 2

## 2021-12-16 MED ORDER — LACTATED RINGERS IV SOLN: Freq: Once | INTRAVENOUS | Status: AC

## 2021-12-16 NOTE — MAU Provider Note (Signed)
Chief Complaint:  Headache and Blurred Vision   Event Date/Time   First Provider Initiated Contact with Patient 12/16/21 2038     HPI: Erin Krueger is a 26 y.o. [id number] at 59w4dwho presents to maternity admissions reporting headache today, unresponsive to Tylenol.  Worried it is preeclampsia. . She reports good fetal movement, denies LOF, vaginal bleeding, vaginal itching/burning, urinary symptoms, dizziness, n/v, diarrhea, constipation or fever/chills.  She denies headache, visual changes or RUQ abdominal pain.  Headache  This is a new problem. The current episode started today. The problem occurs constantly. The problem has been unchanged. The quality of the pain is described as aching. Associated symptoms include blurred vision, a fever and a visual change. Pertinent negatives include no abdominal pain, back pain, nausea, photophobia, seizures or sinus pressure. Nothing aggravates the symptoms. She has tried acetaminophen for the symptoms. The treatment provided no relief.   RN note: Headache since last night. Hx migraines. Took Tylenol but did not help. Denies VB or LOF. States vision is like watching a TV with a lot of static. States had a fever this am of 101.6 and took Tylenol.   Past Medical History: Past Medical History:  Diagnosis Date   ADHD (attention deficit hyperactivity disorder)    Adopted person 10/04/2019   Does not know personal family history other than Diabetes, Breast Cancer. Raised in foster care   Allergy    Anxiety    Asthma    Eating disorder    Headache(784.0)    Kidney stone    kidney stones   Migraines    ODD (oppositional defiant disorder)    PID (acute pelvic inflammatory disease) 01/29/2016   PTSD (post-traumatic stress disorder)    Seizures ([place])    diagnosed age 12    Past obstetric history: OB History  Gravida Para Term Preterm AB Living  $Remov'7 4 3 1 2 3  'INSQoM$ SAB IAB Ectopic Multiple Live Births  2     0 3    # Outcome Date GA Lbr Len/2nd Weight  Sex Delivery Anes PTL Lv  7 Current           6 Preterm 12/28/19 [redacted]w[redacted]d 08:08 / 00:05 2790 g M Vag-Spont EPI  LIV     Complications: Eclampsia  5 Term 11/08/18 [redacted]w[redacted]d 00:15 / 00:18 3275 g M Vag-Spont EPI  LIV     Birth Comments: WNL  4 Term 06/23/17 [redacted]w[redacted]d  2948 g F Vag-Spont EPI N LIV     Birth Comments: cardiac defect  3 SAB 2014          2 Term  [redacted]w[redacted]d       FD  1 SAB             Past Surgical History: Past Surgical History:  Procedure Laterality Date   right knee surgery     TONSILLECTOMY AND ADENOIDECTOMY      Family History: Family History  Problem Relation Age of Onset   Diabetes Mother    Diabetes Sister    Breast cancer Maternal Grandmother    Diabetes Maternal Grandmother    Glaucoma Maternal Grandmother    Other Son        born with hole in heart   Other Daughter        born with hole in heart    Social History: Social History   Tobacco Use   Smoking status: Former    Packs/day: 0.50    Types: Cigarettes, E-cigarettes   Smokeless tobacco:  Never   Tobacco comments:    nicotine patch  Vaping Use   Vaping Use: Former   Substances: Flavoring  Substance Use Topics   Alcohol use: Not Currently   Drug use: Not Currently    Types: Marijuana    Comment: Not since 02/13    Allergies:  Allergies  Allergen Reactions   Bee Venom Anaphylaxis   Penicillins Anaphylaxis    * Has tolerated several cephalosporins* Has patient had a PCN reaction causing immediate rash, facial/tongue/throat swelling, SOB or lightheadedness with hypotension: Unknown Has patient had a PCN reaction causing severe rash involving mucus membranes or skin necrosis: Unknown Has patient had a PCN reaction that required hospitalization: Unknown Has patient had a PCN reaction occurring within the last 10 years: Unknown If all of the above answers are "NO", then may proceed with Cephalosporin use.    Ativan [Lorazepam]     Aggressive-agitation   Prednisone Other (See Comments)    Bleeding  nose bleeding and internal bleeding   Latex Rash    Meds:  Medications Prior to Admission  Medication Sig Dispense Refill Last Dose   aspirin 81 MG EC tablet Take 2 tablets (162 mg total) by mouth daily. Swallow whole. 180 tablet 2 12/16/2021   levETIRAcetam (KEPPRA) 750 MG tablet Take 2 tablets (1,500 mg total) by mouth 2 (two) times daily. 360 tablet 4 12/16/2021   nitrofurantoin, macrocrystal-monohydrate, (MACROBID) 100 MG capsule Take 1 capsule (100 mg total) by mouth at bedtime for 90 doses. 30 capsule 2 12/16/2021   Prenatal Vit-Fe Fumarate-FA (PRENATAL VITAMIN PO) Take by mouth.   12/16/2021   Blood Pressure Monitoring (BLOOD PRESSURE KIT) DEVI 1 Device by Does not apply route once for 1 dose. 1 each 0    butalbital-acetaminophen-caffeine (FIORICET) 50-325-40 MG tablet Take 1 tablet by mouth every 6 (six) hours as needed for headache. 10 tablet 0    diphenhydrAMINE (BENADRYL) 25 mg capsule Take 25 mg by mouth every 6 (six) hours as needed.       I have reviewed patient's Past Medical Hx, Surgical Hx, Family Hx, Social Hx, medications and allergies.   ROS:  Review of Systems  Constitutional:  Positive for fever.  HENT:  Negative for sinus pressure.   Eyes:  Positive for blurred vision. Negative for photophobia.  Gastrointestinal:  Negative for abdominal pain and nausea.  Musculoskeletal:  Negative for back pain.  Neurological:  Positive for headaches. Negative for seizures.  Other systems negative  Physical Exam  Patient Vitals for the past 24 hrs:  BP Pulse SpO2 Height Weight  12/16/21 2023 106/83 -- -- -- --  12/16/21 2011 -- (!) 116 97 % $Re'5\' 6"'Mwx$  (1.676 m) 98.9 kg   Constitutional: Well-developed, well-nourished female in no acute distress.  Cardiovascular: normal rate and rhythm Respiratory: normal effort, clear to auscultation bilaterally GI: Abd soft, non-tender, gravid appropriate for gestational age.   No rebound or guarding. MS: Extremities nontender, no edema, normal  ROM Neurologic: Alert and oriented x 4.  GU: Neg CVAT.  FHT:  Baseline 140 , moderate variability, accelerations present, no decelerations Contractions:   Rare   Labs: Results for orders placed or performed during the hospital encounter of 12/16/21 (from the past 24 hour(s))  Urinalysis, Routine w reflex microscopic Urine, Clean Catch     Status: Abnormal   Collection Time: 12/16/21  8:47 PM  Result Value Ref Range   Color, Urine YELLOW YELLOW   APPearance CLOUDY (A) CLEAR   Specific Gravity, Urine  1.010 1.005 - 1.030   pH 7.0 5.0 - 8.0   Glucose, UA NEGATIVE NEGATIVE mg/dL   Hgb urine dipstick SMALL (A) NEGATIVE   Bilirubin Urine NEGATIVE NEGATIVE   Ketones, ur NEGATIVE NEGATIVE mg/dL   Protein, ur NEGATIVE NEGATIVE mg/dL   Nitrite NEGATIVE NEGATIVE   Leukocytes,Ua MODERATE (A) NEGATIVE   RBC / HPF 21-50 0 - 5 RBC/hpf   WBC, UA 11-20 0 - 5 WBC/hpf   Bacteria, UA RARE (A) NONE SEEN   Squamous Epithelial / LPF 6-10 0 - 5   Amorphous Crystal PRESENT    --/--/O POS (01/13 2255) Refused Resp Panel  Imaging:    MAU Course/MDM: I have ordered labs and reviewed results. Urine sent to culture  Refused Resp Panel Afebrile now, appears well  NST reviewed, reassuring  Treatments in MAU included EFM, IV, Meds  Headache relieved with meds given.Marland Kitchen  Has Fioricet at home  Assessment: Single IUP at [redacted]w[redacted]d Migraine headache Hx fever earlier today, declined testing  Plan: Discharge home Has Rx for Fioricet at home Labor precautions and fetal kick counts Follow up in Office for prenatal visits  Encouraged to return if she develops worsening of symptoms, increase in pain, fever, or other concerning symptoms.  Pt stable at time of discharge.  Hansel Feinstein CNM, MSN Certified Nurse-Midwife 12/16/2021 10:13 PM

## 2021-12-16 NOTE — Telephone Encounter (Signed)
Patient's partner called front office to ask what patient can take for a fever. Temperature currently 100.4. Spoke with patient and patient's partner on the phone. Reporting runny nose, spotty vision, and headache 6/10. Has not taken any medication. Does not have Tylenol at home. Does not have BP cuff. Reviewed with Crissie Reese who recommends patient go to MAU due to prior gestational hypertension diagnosis and pre-e symptoms. Pt agreeable, states she can go at 6 PM when her roommate is home with vehicle. BP cuff ordered for future use. Recommended pt purchase OTC Tylenol for future needs.

## 2021-12-16 NOTE — MAU Note (Addendum)
Headache since last night. Hx migraines. Took Tylenol but did not help. Denies VB or LOF. States vision is like watching a TV with a lot of static. States had a fever this am of 101.6 and took Tylenol.

## 2021-12-17 ENCOUNTER — Other Ambulatory Visit: Payer: Medicaid Other

## 2021-12-18 ENCOUNTER — Ambulatory Visit (INDEPENDENT_AMBULATORY_CARE_PROVIDER_SITE_OTHER): Payer: Medicaid Other | Admitting: Clinical

## 2021-12-18 DIAGNOSIS — F909 Attention-deficit hyperactivity disorder, unspecified type: Secondary | ICD-10-CM

## 2021-12-18 DIAGNOSIS — F4310 Post-traumatic stress disorder, unspecified: Secondary | ICD-10-CM | POA: Diagnosis not present

## 2021-12-22 ENCOUNTER — Encounter: Payer: Medicaid Other | Admitting: Obstetrics & Gynecology

## 2021-12-24 ENCOUNTER — Other Ambulatory Visit: Payer: Medicaid Other

## 2021-12-24 ENCOUNTER — Encounter: Payer: Medicaid Other | Admitting: Obstetrics & Gynecology

## 2021-12-24 ENCOUNTER — Ambulatory Visit: Payer: Medicaid Other | Admitting: Neurology

## 2021-12-24 NOTE — BH Specialist Note (Signed)
Integrated Behavioral Health via Telemedicine Visit  12/24/2021 Erin Krueger [id number]  Number of Integrated Behavioral Health visits: 6 Session Start time: 3:28  Session End time: 4:18 Total time: 50   Referring Provider: Shawna Krueger, CNM Patient/Family location: Home [facility] Provider [facility]: [facility]  All persons participating in visit: [name] and Erin Krueger   Types of Service: Individual psychotherapy and Video visit  I connected with Erin Krueger and/or Erin Krueger's n/a via  Telephone or Engineer, civil (consulting)  (Video is Surveyor, mining) and verified that I am speaking with the correct person using two identifiers. Discussed confidentiality: Yes   I discussed the limitations of telemedicine and the availability of in person appointments.  Discussed there is a possibility of technology failure and discussed alternative modes of communication if that failure occurs.  I discussed that engaging in this telemedicine visit, they consent to the provision of behavioral healthcare and the services will be billed under their insurance.  Patient and/or legal guardian expressed understanding and consented to Telemedicine visit: Yes   Presenting Concerns: Patient and/or family reports the following symptoms/concerns: Continued stress over potential CPS involvement after previous involvement; Pt and FOB have attended all required classes, as well as non-required classes, all baby supplies are ready for baby's arrival; pt has intake at [facility] scheduled for ongoing therapy, and feels well-prepared to bring baby home after birth. Pt is working on obtaining new vehicle, and move into new apartment prior to baby's arrival.  Duration of problem: Current pregnancy; Severity of problem: mild  Patient and/or Family's Strengths/Protective Factors: Social connections, [place] supports in  place (healthy food, safe environments, etc.), Sense of purpose, Physical Health (exercise, healthy diet, medication compliance, etc.), and Caregiver has knowledge of parenting & child development  Goals Addressed: Patient will:  Reduce symptoms of: stress   Increase knowledge and/or ability of: stress reduction   Demonstrate ability to: Increase healthy adjustment to current life circumstances  Progress towards Goals: Ongoing  Interventions: Interventions utilized:  Supportive Reflection Standardized Assessments completed: Not Needed  Patient and/or Family Response: Pt agrees to treatment plan  Assessment: Patient currently experiencing PTSD, ADHD, Psychosocial stress.   Patient may benefit from therapeutic intervention today and ongoing therapy with [facility] .  Plan: Follow up with behavioral health clinician on : Call Kinney Sackmann at [phone], as needed; Asher Muir will call in about 3 for very brief check in prior to childbirth Behavioral recommendations:  -Continue taking all medications as prescribed -Continue plan to print off certificates of all classes attended; put in [facility] bag, in case asked by CPS  -Continue plan to obtain new vehicle and work towards moving into new home, as it becomes available -Continue plan to attend initial intake appointment at [facility]  Referral(s): Integrated [employer] (In Clinic)  I discussed the assessment and treatment plan with the patient and/or parent/guardian. They were provided an opportunity to ask questions and all were answered. They agreed with the plan and demonstrated an understanding of the instructions.   They were advised to call back or seek an in-person evaluation if the symptoms worsen or if the condition fails to improve as anticipated.  Rae Lips, LCSW  Depression screen Dameron [facility] 2/9 12/09/2021 12/01/2021 11/03/2021 10/01/2021 08/12/2021  Decreased Interest 0 0 0 0 0  Down, Depressed, Hopeless 0 0 0 0 0  PHQ - 2  Score 0 0 0 0 0  Altered sleeping 0  1 0 1 1  Tired, decreased energy 0 0 0 0 1  Change in appetite 0 0 0 0 0  Feeling bad or failure about yourself  0 0 0 0 0  Trouble concentrating 0 0 0 0 0  Moving slowly or fidgety/restless 0 0 0 0 0  Suicidal thoughts 0 0 0 0 0  PHQ-9 Score 0 1 0 1 2  Difficult doing work/chores Not difficult at all - - - -   GAD 7 : Generalized Anxiety Score 12/09/2021 12/01/2021 11/03/2021 10/01/2021  Nervous, Anxious, on Edge 0 0 0 0  Control/stop worrying 0 0 0 0  Worry too much - different things 0 0 0 0  Trouble relaxing 0 0 0 0  Restless 0 0 0 0  Easily annoyed or irritable 0 0 0 0  Afraid - awful might happen 0 0 0 0  Total GAD 7 Score 0 0 0 0  Anxiety Difficulty Not difficult at all - - -

## 2021-12-25 ENCOUNTER — Other Ambulatory Visit: Payer: Medicaid Other

## 2021-12-27 NOTE — Progress Notes (Deleted)
° °  PRENATAL VISIT NOTE  Subjective:  Erin Krueger is a 26 y.o. [phone] at 32w***d being seen today for ongoing prenatal care.  She is currently monitored for the following issues for this high-risk pregnancy and has PTSD (post-traumatic stress disorder); ADHD (attention deficit hyperactivity disorder), combined type; Pseudoseizures ([place]); H/o child with congenital heart defect; Asthma; Bipolar 1 disorder ([place]); Hx of preeclampsia, prior pregnancy, currently pregnant; Supervision of high risk pregnancy, antepartum; Recurrent UTI (urinary tract infection) complicating pregnancy; Gestational hypertension; Seizure disorder during pregnancy, antepartum ([place]); and Seizure disorder during pregnancy in third trimester [facility]) on their problem list.  Patient reports {[id number]}.   .  .   . Denies leaking of fluid.   The following portions of the patient's history were reviewed and updated as appropriate: allergies, current medications, past family history, past medical history, past social history, past surgical history and problem list.   Objective:  There were no vitals filed for this visit.  Fetal Status:           General:  Alert, oriented and cooperative. Patient is in no acute distress.  Skin: Skin is warm and dry. No rash noted.   Cardiovascular: Normal heart rate noted  Respiratory: Normal respiratory effort, no problems with respiration noted  Abdomen: Soft, gravid, appropriate for gestational age.        Pelvic: Cervical exam deferred        Extremities: Normal range of motion.     Mental Status: Normal mood and affect. Normal behavior. Normal judgment and thought content.   Assessment and Plan:  Pregnancy: [id number] at 32w***d 1. Supervision of high risk pregnancy, antepartum - 28w labs normal - s/p tdap and flu shots  2. H/o child with congenital heart defect - Normal fetal echo  3. Recurrent urinary tract infection affecting pregnancy in third trimester - Needs Urine TOC  ***  4. Seizure disorder during pregnancy in third trimester (HCC) - Vs. Pseudoseizures. Controlled on Keppra  5. Gestational hypertension, third trimester - No S/Sx of PreE at this time  6. Uncomplicated asthma, unspecified asthma severity, unspecified whether persistent - No meds  Preterm labor symptoms and general obstetric precautions including but not limited to vaginal bleeding, contractions, leaking of fluid and fetal movement were reviewed in detail with the patient. Please refer to After Visit Summary for other counseling recommendations.   No follow-ups on file.  Future Appointments  Date Time Provider [place]  12/31/2021  8:15 AM Radene Gunning, MD [facility]  12/31/2021  8:45 AM [facility] NURSE [facility]  12/31/2021  9:00 AM [facility] US1 [facility]  01/01/2022  3:15 PM [place] [facility]  01/07/2022  8:15 AM Caren Macadam, MD [facility]  01/14/2022  8:15 AM Radene Gunning, MD [facility]  01/14/2022  2:15 PM Alric Ran, MD [facility] None  01/16/2022  8:00 AM Dory Horn, LCSW [facility] None  01/21/2022  8:15 AM Harolyn Rutherford, Sallyanne Havers, MD [facility]  01/28/2022  8:15 AM Clarnce Flock, MD [facility]    Radene Gunning, MD

## 2021-12-31 ENCOUNTER — Encounter: Payer: Medicaid Other | Admitting: Obstetrics and Gynecology

## 2021-12-31 ENCOUNTER — Ambulatory Visit: Payer: Medicaid Other | Attending: Obstetrics and Gynecology

## 2021-12-31 ENCOUNTER — Ambulatory Visit: Payer: Medicaid Other

## 2021-12-31 DIAGNOSIS — G40909 Epilepsy, unspecified, not intractable, without status epilepticus: Secondary | ICD-10-CM

## 2021-12-31 DIAGNOSIS — J45909 Unspecified asthma, uncomplicated: Secondary | ICD-10-CM

## 2021-12-31 DIAGNOSIS — O2343 Unspecified infection of urinary tract in pregnancy, third trimester: Secondary | ICD-10-CM

## 2021-12-31 DIAGNOSIS — Z8279 Family history of other congenital malformations, deformations and chromosomal abnormalities: Secondary | ICD-10-CM

## 2021-12-31 DIAGNOSIS — O133 Gestational [pregnancy-induced] hypertension without significant proteinuria, third trimester: Secondary | ICD-10-CM

## 2021-12-31 DIAGNOSIS — O0990 Supervision of high risk pregnancy, unspecified, unspecified trimester: Secondary | ICD-10-CM

## 2022-01-01 ENCOUNTER — Ambulatory Visit (INDEPENDENT_AMBULATORY_CARE_PROVIDER_SITE_OTHER): Payer: Medicaid Other | Admitting: Clinical

## 2022-01-01 DIAGNOSIS — F4310 Post-traumatic stress disorder, unspecified: Secondary | ICD-10-CM

## 2022-01-01 DIAGNOSIS — F909 Attention-deficit hyperactivity disorder, unspecified type: Secondary | ICD-10-CM

## 2022-01-01 DIAGNOSIS — Z658 Other specified problems related to psychosocial circumstances: Secondary | ICD-10-CM

## 2022-01-05 ENCOUNTER — Telehealth: Payer: Self-pay | Admitting: Family Medicine

## 2022-01-05 NOTE — Telephone Encounter (Signed)
Called patient to inform her of the appointment change, there was no answer to the phone call so a voicemail was left with the call back number for the office.  °

## 2022-01-07 ENCOUNTER — Encounter: Payer: Medicaid Other | Admitting: Family Medicine

## 2022-01-08 ENCOUNTER — Encounter: Payer: Medicaid Other | Admitting: Obstetrics and Gynecology

## 2022-01-13 ENCOUNTER — Encounter (HOSPITAL_COMMUNITY): Payer: Self-pay | Admitting: Obstetrics and Gynecology

## 2022-01-13 ENCOUNTER — Inpatient Hospital Stay (HOSPITAL_COMMUNITY)
Admission: AD | Admit: 2022-01-13 | Discharge: 2022-01-13 | Disposition: A | Discharge status: Home / Self Care | Payer: Medicaid Other | Attending: Emergency Medicine | Admitting: Emergency Medicine

## 2022-01-13 DIAGNOSIS — O99353 Diseases of the nervous system complicating pregnancy, third trimester: Secondary | ICD-10-CM | POA: Insufficient documentation

## 2022-01-13 DIAGNOSIS — F445 Conversion disorder with seizures or convulsions: Secondary | ICD-10-CM

## 2022-01-13 DIAGNOSIS — S0990XA Unspecified injury of head, initial encounter: Secondary | ICD-10-CM | POA: Diagnosis not present

## 2022-01-13 DIAGNOSIS — R569 Unspecified convulsions: Secondary | ICD-10-CM | POA: Diagnosis not present

## 2022-01-13 DIAGNOSIS — O9A213 Injury, poisoning and certain other consequences of external causes complicating pregnancy, third trimester: Secondary | ICD-10-CM | POA: Diagnosis not present

## 2022-01-13 DIAGNOSIS — Z3A34 34 weeks gestation of pregnancy: Secondary | ICD-10-CM | POA: Diagnosis not present

## 2022-01-13 DIAGNOSIS — W19XXXA Unspecified fall, initial encounter: Secondary | ICD-10-CM | POA: Diagnosis not present

## 2022-01-13 LAB — PROTEIN / CREATININE RATIO, URINE
Creatinine, Urine: 70.1 mg/dL
Protein Creatinine Ratio: 0.13 mg/mg{Cre} (ref 0.00–0.15)
Total Protein, Urine: 9 mg/dL

## 2022-01-13 LAB — COMPREHENSIVE METABOLIC PANEL
ALT: 9 U/L (ref 0–44)
AST: 14 U/L — ABNORMAL LOW (ref 15–41)
Albumin: 2.7 g/dL — ABNORMAL LOW (ref 3.5–5.0)
Alkaline Phosphatase: 90 U/L (ref 38–126)
Anion gap: 9 (ref 5–15)
BUN: <5 mg/dL — ABNORMAL LOW (ref 6–20)
CO2: 21 mmol/L — ABNORMAL LOW (ref 22–32)
Calcium: 8.8 mg/dL — ABNORMAL LOW (ref 8.9–10.3)
Chloride: 104 mmol/L (ref 98–111)
Creatinine, Ser: 0.49 mg/dL (ref 0.44–1.00)
GFR, Estimated: >60 mL/min (ref >60)
Glucose, Bld: 93 mg/dL (ref 70–99)
Potassium: 3.6 mmol/L (ref 3.5–5.1)
Sodium: 134 mmol/L — ABNORMAL LOW (ref 135–145)
Total Bilirubin: <0.1 mg/dL — ABNORMAL LOW (ref 0.3–1.2)
Total Protein: 5.9 g/dL — ABNORMAL LOW (ref 6.5–8.1)

## 2022-01-13 LAB — CBC
HCT: 33.2 % — ABNORMAL LOW (ref 36.0–46.0)
Hemoglobin: 11.2 g/dL — ABNORMAL LOW (ref 12.0–15.0)
MCH: 30.6 pg (ref 26.0–34.0)
MCHC: 33.7 g/dL (ref 30.0–36.0)
MCV: 90.7 fL (ref 80.0–100.0)
Platelets: 263 10*3/uL (ref 150–400)
RBC: 3.66 MIL/uL — ABNORMAL LOW (ref 3.87–5.11)
RDW: 12.4 % (ref 11.5–15.5)
WBC: 20.2 10*3/uL — ABNORMAL HIGH (ref 4.0–10.5)
nRBC: 0 % (ref 0.0–0.2)

## 2022-01-13 MED ORDER — LAMOTRIGINE 25 MG PO TABS: 25.0000 mg | ORAL_TABLET | Freq: Every day | ORAL | 0 refills | Status: DC

## 2022-01-13 MED ORDER — LEVETIRACETAM IN NACL 1500 MG/100ML IV SOLN
1500.0000 mg | Freq: Once | INTRAVENOUS | Status: AC
  Administered 2022-01-13: 1500 mg via INTRAVENOUS
  Filled 2022-01-13: qty 100

## 2022-01-13 NOTE — MAU Note (Signed)
.  Erin Krueger is a 26 y.o. at [redacted]w[redacted]d here in MAU via EMS reporting fall and witnessed seizure-like activity. Patient is unable to verbalize name, birthday, or location at this time. Patient is lying in bed with eyes closed. Regular, unlabored breathing on room air. Pt arrived with one saline-locked IV in her Left arm. Unable to obtain any further information at the time of arrival to the unit.   Patient became alert at 1945 and was able to answer some questions. Pt denies VB or LOF and reports +FM.      Vitals:   01/13/22 1944  BP: (!) 85/68  Pulse: (!) 101  Resp: 20  Temp: 98.7 F (37.1 C)  SpO2: 97%     FHT: 150 bpm

## 2022-01-13 NOTE — ED Notes (Addendum)
Received report from MAU RN. Pt found down with seizure like activity and is [redacted] wks pregnant. Pt cleared from MAU perspective.  Came to [place] for neuro eval for fall and seizure activity. Has been diagnosed with pseudoseizures by neuro in previous visits.

## 2022-01-13 NOTE — MAU Provider Note (Signed)
History     CSN: [id number]  Arrival date and time: 01/13/22 3474   Event Date/Time   First Provider Initiated Contact with Patient 01/13/22 1939      Chief Complaint  Patient presents with   Fall   Seizures   HPI Erin Krueger is a 26 y.o. [id number] at [redacted]w[redacted]d who presents from EMS for evaluation of seizure like activity. EMS reports 4 witnessed episodes at home. She has known pseudoseizures. With the first seizure, she fell and hit the back of her head. EMS brought her in with a C collar on. She reports pain in the back of her head. She denies any abdominal pain, vaginal bleeding or discharge. She reports normal fetal movement.   OB History     Gravida  7   Para  4   Term  3   Preterm  1   AB  2   Living  3      SAB  2   IAB      Ectopic      Multiple  0   Live Births  3           Past Medical History:  Diagnosis Date   ADHD (attention deficit hyperactivity disorder)    Adopted person 10/04/2019   Does not know personal family history other than Diabetes, Breast Cancer. Raised in foster care   Allergy    Anxiety    Asthma    Eating disorder    Headache(784.0)    Kidney stone    kidney stones   Migraines    ODD (oppositional defiant disorder)    PID (acute pelvic inflammatory disease) 01/29/2016   PTSD (post-traumatic stress disorder)    Seizures (HCC)    diagnosed age 41    Past Surgical History:  Procedure Laterality Date   right knee surgery     TONSILLECTOMY AND ADENOIDECTOMY      Family History  Problem Relation Age of Onset   Diabetes Mother    Diabetes Sister    Breast cancer Maternal Grandmother    Diabetes Maternal Grandmother    Glaucoma Maternal Grandmother    Other Son        born with hole in heart   Other Daughter        born with hole in heart    Social History   Tobacco Use   Smoking status: Former    Packs/day: 0.50    Types: Cigarettes, E-cigarettes   Smokeless tobacco: Never   Tobacco comments:     nicotine patch  Vaping Use   Vaping Use: Former   Substances: Flavoring  Substance Use Topics   Alcohol use: Not Currently   Drug use: Not Currently    Types: Marijuana    Comment: Not since 02/13    Allergies:  Allergies  Allergen Reactions   Bee Venom Anaphylaxis   Penicillins Anaphylaxis    * Has tolerated several cephalosporins* Has patient had a PCN reaction causing immediate rash, facial/tongue/throat swelling, SOB or lightheadedness with hypotension: Unknown Has patient had a PCN reaction causing severe rash involving mucus membranes or skin necrosis: Unknown Has patient had a PCN reaction that required hospitalization: Unknown Has patient had a PCN reaction occurring within the last 10 years: Unknown If all of the above answers are "NO", then may proceed with Cephalosporin use.    Ativan [Lorazepam]     Aggressive-agitation   Prednisone Other (See Comments)    Bleeding nose bleeding and internal bleeding  Latex Rash    Medications Prior to Admission  Medication Sig Dispense Refill Last Dose   aspirin 81 MG EC tablet Take 2 tablets (162 mg total) by mouth daily. Swallow whole. 180 tablet 2 01/13/2022   butalbital-acetaminophen-caffeine (FIORICET) 50-325-40 MG tablet Take 1 tablet by mouth every 6 (six) hours as needed for headache. 10 tablet 0 01/13/2022   levETIRAcetam (KEPPRA) 750 MG tablet Take 2 tablets (1,500 mg total) by mouth 2 (two) times daily. 360 tablet 4 01/13/2022   Prenatal Vit-Fe Fumarate-FA (PRENATAL VITAMIN PO) Take by mouth.   01/13/2022   diphenhydrAMINE (BENADRYL) 25 mg capsule Take 25 mg by mouth every 6 (six) hours as needed.       Review of Systems  Constitutional: Negative.  Negative for fatigue and fever.  HENT: Negative.    Respiratory: Negative.  Negative for shortness of breath.   Cardiovascular: Negative.  Negative for chest pain.  Gastrointestinal: Negative.  Negative for abdominal pain, constipation, diarrhea, nausea and vomiting.   Genitourinary: Negative.  Negative for dysuria, vaginal bleeding and vaginal discharge.  Neurological:  Positive for seizures and headaches. Negative for dizziness.  Physical Exam   Blood pressure (!) 85/68, pulse (!) 101, temperature 98.7 F (37.1 C), temperature source Oral, resp. rate 20, SpO2 97 %, unknown if currently breastfeeding.  Physical Exam Vitals and nursing note reviewed.  Constitutional:      General: She is not in acute distress.    Appearance: She is well-developed.  HENT:     Head: Normocephalic.  Eyes:     Pupils: Pupils are equal, round, and reactive to light.  Cardiovascular:     Rate and Rhythm: Normal rate and regular rhythm.     Heart sounds: Normal heart sounds.  Pulmonary:     Effort: Pulmonary effort is normal. No respiratory distress.     Breath sounds: Normal breath sounds.  Abdominal:     General: Bowel sounds are normal. There is no distension.     Palpations: Abdomen is soft.     Tenderness: There is no abdominal tenderness.  Skin:    General: Skin is warm and dry.  Neurological:     Mental Status: She is alert and oriented to person, place, and time.  Psychiatric:        Mood and Affect: Mood normal.        Behavior: Behavior normal.        Thought Content: Thought content normal.        Judgment: Judgment normal.   Fetal Tracing:  Baseline: 130 Variability: moderate Accels: 15x15 Decels: none  Toco: none   MAU Course  Procedures  MDM Consulted with Dr. Jolayne Panther- with reactive NST and normal BPs, patient is cleared obstetrically. Recommends transfer to [facility].   Report called to ED provider Alycia Rossetti who accepts transfer to clear head and C Spine injuries.   Assessment and Plan   1. Pseudoseizure   2. Injury of head, initial encounter   3. [redacted] weeks gestation of pregnancy    -Transfer to [employer] CNM 01/13/2022, 8:04 PM

## 2022-01-13 NOTE — ED Notes (Signed)
DC instructions reviewed with pt. Pt verbalized understanding.  Pt DC 

## 2022-01-13 NOTE — Progress Notes (Signed)
ON CALL PHONE CONSULT  Call from Dr. [email or web address], ER   Discussion: 26 year old woman [redacted] weeks pregnant with her seventh pregnancy, with history of epileptic and nonepileptic spells with prior EEG is completely unremarkable, ADD, anxiety, PTSD.   Concern for seizure-like activity today.  In addition to giving her her 1500 mg dose of Keppra once and then continuing 1500 mg Keppra twice daily, I recommended starting her on lamotrigine with the titration protocol below and a neurology follow-up in the coming week.  This slow titration is essential given the severe side effect of Stevens-Johnson syndrome.  Lamotrigine, like Keppra is safe in pregnancy, at this time in her gestation  Lamotrigine Morning Evening  Week 1 25mg    Week 2 25mg  25mg   Week 3 50mg  25mg   Week 4 50mg  50mg   Week 5 75mg  50mg   Week 6 75mg  75mg   Week 7 100mg  75mg   Week 8 100mg  100mg    -- 25, MD Neurologist [facility] Pager: [phone]

## 2022-01-13 NOTE — ED Notes (Signed)
Pt's c-collar put back on after Pt removed it. Pt instructed to keep it on.

## 2022-01-13 NOTE — Progress Notes (Unsigned)
° °  PRENATAL VISIT NOTE  Subjective:  Erin Krueger is a 26 y.o. [phone] at [redacted]w[redacted]d being seen today for ongoing prenatal care.  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  Patient reports {[id number]}.   .  .   . Denies leaking of fluid.   The following portions of the patient's history were reviewed and updated as appropriate: allergies, current medications, past family history, past medical history, past social history, past surgical history and problem list.   Objective:  There were no vitals filed for this visit.  Fetal Status:           General:  Alert, oriented and cooperative. Patient is in no acute distress.  Skin: Skin is warm and dry. No rash noted.   Cardiovascular: Normal heart rate noted  Respiratory: Normal respiratory effort, no problems with respiration noted  Abdomen: Soft, gravid, appropriate for gestational age.        Pelvic: Cervical exam deferred        Extremities: Normal range of motion.     Mental Status: Normal mood and affect. Normal behavior. Normal judgment and thought content.   Assessment and Plan:  Pregnancy: [id number] at [redacted]w[redacted]d 1. Gestational hypertension, third trimester - dx 1/9 - reviewed need for antenatal testing - did not go to growth on 2/8. Will reschedule *** - Recheck labs today - P/C normal on 1/17, normal labs on 1/13. *** - Reviewed timing of delivery - schedule IOL at 37w  after next appt.   2. Supervision of high risk pregnancy, antepartum - GBS next time  3. Hx of preeclampsia, prior pregnancy, currently pregnant - Continue ldASA  4. Pseudoseizures ([place]) - Reports on Keppra  5. 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 Please refer to After Visit Summary for other counseling recommendations.   No follow-ups on file.  Future Appointments  Date Time Provider [place]  01/14/2022  8:15 AM Radene Gunning, MD [facility]  01/14/2022  2:15 PM Alric Ran, MD [facility] None  01/16/2022  8:00 AM Dory Horn, LCSW [facility] None  01/21/2022  8:15 AM Harolyn Rutherford, Sallyanne Havers, MD [facility]  01/28/2022  8:15 AM Clarnce Flock, MD [facility]    Radene Gunning, MD

## 2022-01-13 NOTE — ED Provider Notes (Signed)
[facility] EMERGENCY DEPARTMENT Provider Note   CSN: [id number] Arrival date & time: 01/13/22  1937     History  Chief Complaint  Patient presents with   Fall   Seizures    Erin Krueger is a 26 y.o. female.  HPI     26 year old female G7 P3-1-2-3 at [redacted] weeks pregnant with a history of ADHD, asthma, anxiety, eating disorder, headache, renal stones, migraines, oppositional defiant disorder, PID, PTSD, diagnosis of epilepsy with seizures since the age of 55, has hx of partial and tonic clonic, also history of pseudoseizures, previous evaluation in December by neuro hospitalist when she was [redacted] weeks pregnant with concern for 4 epileptic breakthrough seizures and plan was to increase her scheduled dose of Keppra to 1500 mg twice daily, was seen again for seizures and headache December 06, 2021 and evaluated by neurology with MRI brain without contrast, MRV without contrast completed without acute abnormalities, EEG without abnormalities, who presents from MAU with concern for seizure.  Reports 630PM was going to get something and then was on floor and seizing, eyes rolled back, whole body shaking, 30 second episodes, groaned opened eyes, then had it again, approx 5 times lasting total 15 minutes. After that was out of it for about 20-25 minutes. She only remembers waking up in the hospital.  Did not bite tongue or have incontinence.  Last pregnancy had preeclampsia, told this time she had preeclampsia last time-thinks she has gestational htn and preeclampsia-not on medications for that. [place] diagnosed gestation hypertension per note 1/9, planned induction at 37wk  No neck pain, no back pain 2 months of headaches, at times will have black spots in vision, now has static vision which happens with other headaches \. Denies numbness, weakness, difficulty talking or walking, facial droop.  NO numbness.    Had fever last week but not this week.  Feeling baby move/no loss  of fluid/contractions/bleeding     Past Medical History:  Diagnosis Date   ADHD (attention deficit hyperactivity disorder)    Adopted person 10/04/2019   Does not know personal family history other than Diabetes, Breast Cancer. Raised in foster care   Allergy    Anxiety    Asthma    Eating disorder    Headache(784.0)    Kidney stone    kidney stones   Migraines    ODD (oppositional defiant disorder)    PID (acute pelvic inflammatory disease) 01/29/2016   PTSD (post-traumatic stress disorder)    Seizures (HCC)    diagnosed age 82    Home Medications Prior to Admission medications   Medication Sig Start Date End Date Taking? Authorizing Provider  aspirin 81 MG EC tablet Take 2 tablets (162 mg total) by mouth daily. Swallow whole. 08/12/21  Yes Cheral Marker, CNM  butalbital-acetaminophen-caffeine (FIORICET) [id number] MG tablet Take 1 tablet by mouth every 6 (six) hours as needed for headache. 12/06/21  Yes Milas Hock, MD  lamoTRIgine (LAMICTAL) 25 MG tablet Take 1 tablet (25 mg total) by mouth daily for 7 days. 01/13/22 01/20/22 Yes Alvira Monday, MD  levETIRAcetam (KEPPRA) 750 MG tablet Take 2 tablets (1,500 mg total) by mouth 2 (two) times daily. 11/22/21 02/20/22 Yes Myna Hidalgo, DO  Prenatal Vit-Fe Fumarate-FA (PRENATAL VITAMIN PO) Take by mouth.   Yes [provider]  diphenhydrAMINE (BENADRYL) 25 mg capsule Take 25 mg by mouth every 6 (six) hours as needed.    [provider]  albuterol (PROVENTIL HFA;VENTOLIN HFA) 108 (90  Base) MCG/ACT inhaler Inhale 2 puffs into the lungs every 6 (six) hours as needed for wheezing or shortness of breath. 04/05/18 11/28/19  Cheral Marker, CNM  metoCLOPramide (REGLAN) 10 MG tablet Take 1 tablet (10 mg total) by mouth every 6 (six) hours as needed for nausea. 07/19/19 11/28/19  Cresenzo-Dishmon, Scarlette Calico, CNM      Allergies    Bee venom, Penicillins, Ativan [lorazepam], Prednisone, and Latex    Review of Systems    Review of Systems See above  Physical Exam Updated Vital Signs BP 134/81    Pulse 96    Temp 98.7 F (37.1 C) (Oral)    Resp 16    LMP  (LMP Unknown)    SpO2 100%  Physical Exam Vitals and nursing note reviewed.  Constitutional:      General: She is not in acute distress.    Appearance: Normal appearance. She is well-developed. She is not ill-appearing or diaphoretic.  HENT:     Head: Normocephalic and atraumatic.  Eyes:     General: No visual field deficit.    Extraocular Movements: Extraocular movements intact.     Conjunctiva/sclera: Conjunctivae normal.     Pupils: Pupils are equal, round, and reactive to light.  Cardiovascular:     Rate and Rhythm: Normal rate and regular rhythm.     Pulses: Normal pulses.  Pulmonary:     Effort: Pulmonary effort is normal. No respiratory distress.  Abdominal:     General: There is no distension.     Palpations: Abdomen is soft.     Tenderness: There is no abdominal tenderness. There is no guarding.     Comments: gravid  Musculoskeletal:        General: No swelling or tenderness.     Cervical back: Normal range of motion.  Skin:    General: Skin is warm and dry.     Findings: No erythema or rash.  Neurological:     General: No focal deficit present.     Mental Status: She is alert and oriented to person, place, and time.     GCS: GCS eye subscore is 4. GCS verbal subscore is 5. GCS motor subscore is 6.     Cranial Nerves: No cranial nerve deficit, dysarthria or facial asymmetry.     Sensory: No sensory deficit.     Motor: No weakness or tremor.     Coordination: Coordination normal. Finger-Nose-Finger Test normal.     Gait: Gait normal.    ED Results / Procedures / Treatments   Labs (all labs ordered are listed, but only abnormal results are displayed) Labs Reviewed  CBC - Abnormal; Notable for the following components:      Result Value   WBC 20.2 (*)    RBC 3.66 (*)    Hemoglobin 11.2 (*)    HCT 33.2 (*)    All other  components within normal limits  COMPREHENSIVE METABOLIC PANEL - Abnormal; Notable for the following components:   Sodium 134 (*)    CO2 21 (*)    BUN <5 (*)    Calcium 8.8 (*)    Total Protein 5.9 (*)    Albumin 2.7 (*)    AST 14 (*)    Total Bilirubin <0.1 (*)    All other components within normal limits  PROTEIN / CREATININE RATIO, URINE    EKG None  Radiology No results found.  Procedures Procedures    Medications Ordered in ED Medications  levETIRAcetam (KEPPRA) IVPB 1500  mg/ 100 mL premix (0 mg Intravenous Stopped 01/13/22 2204)    ED Course/ Medical Decision Making/ A&P                           Medical Decision Making Risk Prescription drug management.   26 year old female G7 P3123 at [redacted] weeks pregnant with a history of ADHD, asthma, anxiety, eating disorder, headache, renal stones, migraines, oppositional defiant disorder, PID, PTSD, diagnosis of epilepsy with seizures since the age of 80, has hx of partial and tonic clonic, also history of pseudoseizures, previous evaluation in December by neuro hospitalist when she was [redacted] weeks pregnant with concern for 4 epileptic breakthrough seizures and plan was to increase her scheduled dose of Keppra to 1500 mg twice daily, was seen again for seizures and headache December 06, 2021 and evaluated by neurology with MRI brain without contrast, MRV without contrast completed without acute abnormalities, who presents from MAU with concern for seizure.   DDx includes epileptic seizures, nonepileptic seizures, eclampsia, CVT, electrolyte abnormality, syncope.  Evaluated by OB, BP WNL (although has been diagnosed with gestation hypertension this pregnancy and had one BP 134 just prior to discharge) and given history of seizures and above have low suspicion for eclampsia.  Headache today not as severe as previously, had MRI completed last month that did not show CVT or abnormalities.  No fever, doubt meningitis. No signs of skull  fracture on exam, [place] Head CT negative, NEXUS CSpine negative, normal mental status, low suspicion for intracranial hemorrhage or acute traumatic intracranial or cervical injury.  Neurologic exam normal.    Personally reviewed prior records of hospitalization and OB visits.   Reviewed labs obtained today in MAU without significant electrolyte abnormalities. Leukocytosis present before, likely stress reaction.  Anemia in setting of pregnancy 11.2.  Suspect breakthrough seizure with history of seizure disorder given postictal state, no memory of event, description per partner.  Given keppra 1500mg . Discussed with Neurology Dr. and will add lamictal 25mg  with plan to titrate--she has appointment on 2/24 or 2/25 and recommend continued discussion of titration and medication plan with her neurologist.  She is well appearing, improved in ED. Patient discharged in stable condition with understanding of reasons to return.         Final Clinical Impression(s) / ED Diagnoses Final diagnoses:  [redacted] weeks gestation of pregnancy  Seizure [facility])    Rx / DC Orders ED Discharge Orders          Ordered    lamoTRIgine (LAMICTAL) 25 MG tablet  Daily        01/13/22 2144              01/15/22, MD 01/14/22 1057

## 2022-01-14 ENCOUNTER — Encounter: Payer: Medicaid Other | Admitting: Obstetrics and Gynecology

## 2022-01-14 ENCOUNTER — Ambulatory Visit: Payer: Medicaid Other | Admitting: Neurology

## 2022-01-14 DIAGNOSIS — O0990 Supervision of high risk pregnancy, unspecified, unspecified trimester: Secondary | ICD-10-CM

## 2022-01-14 DIAGNOSIS — O09299 Supervision of pregnancy with other poor reproductive or obstetric history, unspecified trimester: Secondary | ICD-10-CM

## 2022-01-14 DIAGNOSIS — Z8279 Family history of other congenital malformations, deformations and chromosomal abnormalities: Secondary | ICD-10-CM

## 2022-01-14 DIAGNOSIS — O133 Gestational [pregnancy-induced] hypertension without significant proteinuria, third trimester: Secondary | ICD-10-CM

## 2022-01-14 DIAGNOSIS — R569 Unspecified convulsions: Secondary | ICD-10-CM

## 2022-01-15 ENCOUNTER — Encounter: Payer: Self-pay | Admitting: Neurology

## 2022-01-16 ENCOUNTER — Ambulatory Visit (HOSPITAL_COMMUNITY): Payer: Medicaid Other | Admitting: Licensed Clinical Social Worker

## 2022-01-16 DIAGNOSIS — F902 Attention-deficit hyperactivity disorder, combined type: Secondary | ICD-10-CM

## 2022-01-16 DIAGNOSIS — F4310 Post-traumatic stress disorder, unspecified: Secondary | ICD-10-CM

## 2022-01-16 NOTE — Plan of Care (Signed)
Pt agreeable to plan  ?
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
## 2022-01-19 ENCOUNTER — Telehealth (HOSPITAL_COMMUNITY): Payer: Self-pay | Admitting: Licensed Clinical Social Worker

## 2022-01-19 ENCOUNTER — Ambulatory Visit (INDEPENDENT_AMBULATORY_CARE_PROVIDER_SITE_OTHER): Payer: Medicaid Other | Admitting: Licensed Clinical Social Worker

## 2022-01-19 DIAGNOSIS — F4310 Post-traumatic stress disorder, unspecified: Secondary | ICD-10-CM | POA: Diagnosis not present

## 2022-01-19 NOTE — Progress Notes (Signed)
THERAPIST PROGRESS NOTE  Virtual Visit via Video Note  I connected with Erin Krueger on 01/19/22 at  9:00 AM EST by a video enabled telemedicine application and verified that I am speaking with the correct person using two identifiers.  Location: Patient: [facility]  Provider: Providers Home    I discussed the limitations of evaluation and management by telemedicine and the availability of in person appointments. The patient expressed understanding and agreed to proceed.    I discussed the assessment and treatment plan with the patient. The patient was provided an opportunity to ask questions and all were answered. The patient agreed with the plan and demonstrated an understanding of the instructions.   The patient was advised to call back or seek an in-person evaluation if the symptoms worsen or if the condition fails to improve as anticipated.  I provided 40 minutes of non-face-to-face time during this encounter.   [name], LCSW   Participation Level: Active  Behavioral Response: CasualAlertAnxious and Depressed  Type of Therapy: Individual Therapy  Treatment Goals addressed: Practice interpersonal effectiveness skills 3 times per week for the next 3  weeks  ProgressTowards Goals: Initial  Interventions: CBT, Motivational Interviewing, and Supportive  Summary: Erin Krueger is a 26 y.o. female who presents with depressed and anxious mood\affect.  Patient was pleasant, cooperative, and maintained good eye contact.  She engaged well in therapy session and was alert and oriented x5.  Erin Krueger comes in today as a move up list attendee.  LCSW called patient as slot was open for today at 9 AM and patient was agreeable to do a follow-up therapy session from initial assessment.  Patient reports primary stressors today are pregnancy, trauma, and anxiety.  Patient reports that she is [redacted] weeks pregnant and is due in the next 3 to 4 weeks.  Patient reports stress and  tension for this baby as she has 3 other children that are either in Department [employer] Social [employer] custody or already adopted.  Erin Krueger reports that it is her priority to keep this child.  Patient states that she has had her children taken prior due to drug addiction and unstable mental stability.  Erin Krueger reports poor boundary setting with family and friends.  We Erin Krueger reports that she is "a people pleaser".  She states that it is a struggle for her to prioritize herself.  We Erin Krueger states that it was "New Year's resolution" to prioritize herself for the year of 2023.  Suicidal/Homicidal: Nowithout intent/plan  Therapist Response:     Intervention/Plan: LCSW educated about signs and symptoms of PTSD.  LCSW utilized narrative therapy for patient to express thoughts feelings and emotions along with psychoanalytic therapy.  LCSW educated patient on boundary setting.  LCSW educated patient on advocating for self over others.  Patient to utilize boundary setting skills such as expressing her wants to others over the next 4 weeks.  Plan: Return again in 4 weeks.  Diagnosis: PTSD (post-traumatic stress disorder)  Collaboration of Care: Other none for this session.  Patient/Guardian was advised Release of Information must be obtained prior to any record release in order to collaborate their care with an outside provider. Patient/Guardian was advised if they have not already done so to contact the registration department to sign all necessary forms in order for [place] to release information regarding their care.   Consent: Patient/Guardian gives verbal consent for treatment and assignment of benefits for services provided during this visit. Patient/Guardian expressed understanding and agreed  to proceed.   [name], LCSW 01/19/2022

## 2022-01-19 NOTE — Telephone Encounter (Signed)
LCSW attempted to call x 3 to cell phone listed in epic. Each time call could not be completed at dialed. LCSW called to as provider had open today at 10 or 11.

## 2022-01-21 ENCOUNTER — Encounter: Payer: Medicaid Other | Admitting: Obstetrics & Gynecology

## 2022-01-21 ENCOUNTER — Telehealth: Payer: Self-pay | Admitting: Family Medicine

## 2022-01-21 ENCOUNTER — Inpatient Hospital Stay (HOSPITAL_COMMUNITY)
Admission: AD | Admit: 2022-01-21 | Discharge: 2022-01-21 | Disposition: A | Discharge status: Home / Self Care | Payer: Medicaid Other | Attending: Obstetrics & Gynecology | Admitting: Obstetrics & Gynecology

## 2022-01-21 ENCOUNTER — Encounter (HOSPITAL_COMMUNITY): Payer: Self-pay | Admitting: Obstetrics & Gynecology

## 2022-01-21 ENCOUNTER — Other Ambulatory Visit: Payer: Self-pay

## 2022-01-21 DIAGNOSIS — R519 Headache, unspecified: Secondary | ICD-10-CM | POA: Insufficient documentation

## 2022-01-21 DIAGNOSIS — Z87891 Personal history of nicotine dependence: Secondary | ICD-10-CM | POA: Diagnosis not present

## 2022-01-21 DIAGNOSIS — R030 Elevated blood-pressure reading, without diagnosis of hypertension: Secondary | ICD-10-CM | POA: Diagnosis present

## 2022-01-21 DIAGNOSIS — O99891 Other specified diseases and conditions complicating pregnancy: Secondary | ICD-10-CM | POA: Diagnosis not present

## 2022-01-21 DIAGNOSIS — Z3A35 35 weeks gestation of pregnancy: Secondary | ICD-10-CM | POA: Insufficient documentation

## 2022-01-21 DIAGNOSIS — Z79899 Other long term (current) drug therapy: Secondary | ICD-10-CM | POA: Insufficient documentation

## 2022-01-21 DIAGNOSIS — Z880 Allergy status to penicillin: Secondary | ICD-10-CM | POA: Insufficient documentation

## 2022-01-21 DIAGNOSIS — Z7982 Long term (current) use of aspirin: Secondary | ICD-10-CM | POA: Diagnosis not present

## 2022-01-21 HISTORY — DX: Other specified behavioral and emotional disorders with onset usually occurring in childhood and adolescence: F98.8

## 2022-01-21 LAB — CBC
HCT: 34.2 % — ABNORMAL LOW (ref 36.0–46.0)
Hemoglobin: 11.2 g/dL — ABNORMAL LOW (ref 12.0–15.0)
MCH: 30 pg (ref 26.0–34.0)
MCHC: 32.7 g/dL (ref 30.0–36.0)
MCV: 91.7 fL (ref 80.0–100.0)
Platelets: 269 10*3/uL (ref 150–400)
RBC: 3.73 MIL/uL — ABNORMAL LOW (ref 3.87–5.11)
RDW: 12.5 % (ref 11.5–15.5)
WBC: 24 10*3/uL — ABNORMAL HIGH (ref 4.0–10.5)
nRBC: 0 % (ref 0.0–0.2)

## 2022-01-21 LAB — URINALYSIS, ROUTINE W REFLEX MICROSCOPIC
Bilirubin Urine: NEGATIVE
Glucose, UA: NEGATIVE mg/dL
Hgb urine dipstick: NEGATIVE
Ketones, ur: NEGATIVE mg/dL
Nitrite: NEGATIVE
Protein, ur: NEGATIVE mg/dL
Specific Gravity, Urine: 1.017 (ref 1.005–1.030)
pH: 7 (ref 5.0–8.0)

## 2022-01-21 LAB — PROTEIN / CREATININE RATIO, URINE
Creatinine, Urine: 146.98 mg/dL
Protein Creatinine Ratio: 0.15 mg/mg{Cre} (ref 0.00–0.15)
Total Protein, Urine: 22 mg/dL

## 2022-01-21 LAB — COMPREHENSIVE METABOLIC PANEL
ALT: 11 U/L (ref 0–44)
AST: 13 U/L — ABNORMAL LOW (ref 15–41)
Albumin: 2.7 g/dL — ABNORMAL LOW (ref 3.5–5.0)
Alkaline Phosphatase: 83 U/L (ref 38–126)
Anion gap: 8 (ref 5–15)
BUN: 6 mg/dL (ref 6–20)
CO2: 22 mmol/L (ref 22–32)
Calcium: 8.6 mg/dL — ABNORMAL LOW (ref 8.9–10.3)
Chloride: 106 mmol/L (ref 98–111)
Creatinine, Ser: 0.55 mg/dL (ref 0.44–1.00)
GFR, Estimated: >60 mL/min (ref >60)
Glucose, Bld: 86 mg/dL (ref 70–99)
Potassium: 4 mmol/L (ref 3.5–5.1)
Sodium: 136 mmol/L (ref 135–145)
Total Bilirubin: 0.2 mg/dL — ABNORMAL LOW (ref 0.3–1.2)
Total Protein: 5.8 g/dL — ABNORMAL LOW (ref 6.5–8.1)

## 2022-01-21 MED ORDER — DIPHENHYDRAMINE HCL 25 MG PO CAPS
25.0000 mg | ORAL_CAPSULE | Freq: Once | ORAL | Status: AC
Start: 2022-01-21 — End: 2022-01-21
  Administered 2022-01-21: 25 mg via ORAL
  Filled 2022-01-21: qty 1

## 2022-01-21 MED ORDER — METOCLOPRAMIDE HCL 10 MG PO TABS
10.0000 mg | ORAL_TABLET | Freq: Once | ORAL | Status: AC
  Administered 2022-01-21: 10 mg via ORAL
  Filled 2022-01-21: qty 1

## 2022-01-21 MED ORDER — SODIUM CHLORIDE 0.9 % IV SOLN: 500.0000 mg | Freq: Once | INTRAVENOUS | Status: DC

## 2022-01-21 NOTE — Telephone Encounter (Signed)
Patient called in wanting to know if her induction was still set for March 10th due to her needed extra time to find transportation. I told her I do not see it in her chart so I would send a message to the nurses to see if someone can give her a call back.  ?

## 2022-01-21 NOTE — MAU Note (Signed)
Erin Krueger [name] is a 26 y.o. at [redacted]w[redacted]d here in MAU reporting: has been having some increased swelling and blurry vision and elevated BP at home. Was advised to come in. Headache for the past 3 days, has tried tylenol and last dose was a couple hours ago states she took 1 regular strength. No labor complaints. +FM. ? ?Onset of complaint: ongoing ? ?Pain score: 5/10 ? ?Vitals:  ? 01/21/22 1616  ?BP: 137/89  ?Pulse: (!) 105  ?Resp: 18  ?Temp: 98 ?F (36.7 ?C)  ?SpO2: 100%  ?   ?FHT:146 ? ?Lab orders placed from triage: UA ? ?

## 2022-01-21 NOTE — MAU Provider Note (Signed)
?History  ?  ? ?CSN: [id number] ? ?Arrival date and time: 01/21/22 1601 ? ? None  ?  ?Chief Complaint  ?Patient presents with  ? Hypertension  ? Headache  ? ?HPI ?Erin Krueger is a 26 y.o. [id number] at [redacted]w[redacted]d who presents to MAU for elevated BP and swelling in feet and hands. Patient reports she noticed the swelling start approximately 1.5 weeks ago. Swelling is intermittent. She reports a headache that she rates 6/10. She reports she took "250mg  of Tylenol 2 hours ago" and fioricet this morning which did not help headache. Reports intermittent blurry vision- none currently. Reports a history of migraines. She denies chest pain or shortness or breath. Reports irregular contractions. Denies leaking fluid or vaginal bleeding but reports she "lost her mucous plug 1 week ago". Endorses active fetal movement. She receives prenatal [facility] at [facility], however has missed her last 3 appointments due to transportation.  ? ?OB History   ? ? Gravida  ?7  ? Para  ?4  ? Term  ?3  ? Preterm  ?1  ? AB  ?2  ? Living  ?3  ?  ? ? SAB  ?2  ? IAB  ?   ? Ectopic  ?   ? Multiple  ?0  ? Live Births  ?3  ?   ?  ?  ? ? ?Past Medical History:  ?Diagnosis Date  ? ADD (attention deficit disorder)   ? ADHD (attention deficit hyperactivity disorder)   ? Adopted person 10/04/2019  ? Does not know personal family history other than Diabetes, Breast Cancer. Raised in foster care  ? Allergy   ? Anxiety   ? Asthma   ? Eating disorder   ? Headache(784.0)   ? Kidney stone   ? kidney stones  ? Migraines   ? ODD (oppositional defiant disorder)   ? PID (acute pelvic inflammatory disease) 01/29/2016  ? PTSD (post-traumatic stress disorder)   ? PTSD (post-traumatic stress disorder)   ? Seizures (HCC)   ? diagnosed age 89  ? ? ?Past Surgical History:  ?Procedure Laterality Date  ? right knee surgery    ? TONSILLECTOMY AND ADENOIDECTOMY    ? ? ?Family History  ?Problem Relation Age of Onset  ? Diabetes Mother   ? Diabetes Sister   ? Breast cancer Maternal Grandmother    ? Diabetes Maternal Grandmother   ? Glaucoma Maternal Grandmother   ? Other Son   ?     born with hole in heart  ? Other Daughter   ?     born with hole in heart  ? ? ?Social History  ? ?Tobacco Use  ? Smoking status: Former  ?  Packs/day: 0.50  ?  Types: Cigarettes, E-cigarettes  ? Smokeless tobacco: Never  ? Tobacco comments:  ?  nicotine patch  ?Vaping Use  ? Vaping Use: Former  ? Substances: Flavoring  ?Substance Use Topics  ? Alcohol use: Not Currently  ? Drug use: Not Currently  ?  Types: Marijuana, Cocaine  ?  Comment: Not since 2018  ? ? ?Allergies:  ?Allergies  ?Allergen Reactions  ? Bee Venom Anaphylaxis  ? Penicillins Anaphylaxis  ?  * Has tolerated several cephalosporins* ?Has patient had a PCN reaction causing immediate rash, facial/tongue/throat swelling, SOB or lightheadedness with hypotension: Unknown ?Has patient had a PCN reaction causing severe rash involving mucus membranes or skin necrosis: Unknown ?Has patient had a PCN reaction that required hospitalization: Unknown ?Has patient had a PCN  reaction occurring within the last 10 years: Unknown ?If all of the above answers are "NO", then may proceed with Cephalosporin use. ?  ? Ativan [Lorazepam]   ?  Aggressive-agitation  ? Peach [Prunus Persica]   ? Prednisone Other (See Comments)  ?  Bleeding nose bleeding and internal bleeding  ? Latex Rash  ? ? ?Medications Prior to Admission  ?Medication Sig Dispense Refill Last Dose  ? aspirin 81 MG EC tablet Take 2 tablets (162 mg total) by mouth daily. Swallow whole. 180 tablet 2 01/21/2022  ? lamoTRIgine (LAMICTAL) 25 MG tablet Take 1 tablet (25 mg total) by mouth daily for 7 days. 7 tablet 0 01/21/2022  ? levETIRAcetam (KEPPRA) 750 MG tablet Take 2 tablets (1,500 mg total) by mouth 2 (two) times daily. 360 tablet 4 01/21/2022  ? magnesium oxide (MAG-OX) 400 MG tablet Take 400 mg by mouth daily.   01/21/2022  ? Prenatal Vit-Fe Fumarate-FA (PRENATAL VITAMIN PO) Take by mouth.   01/21/2022  ?  butalbital-acetaminophen-caffeine (FIORICET) 50-325-40 MG tablet Take 1 tablet by mouth every 6 (six) hours as needed for headache. 10 tablet 0   ? diphenhydrAMINE (BENADRYL) 25 mg capsule Take 25 mg by mouth every 6 (six) hours as needed.     ? ? ?Review of Systems  ?Constitutional: Negative.   ?Eyes:  Positive for visual disturbance (intermittent blurry vision).  ?Respiratory: Negative.    ?Cardiovascular: Negative.   ?Gastrointestinal:  Positive for abdominal pain (irregular contractions).  ?Genitourinary: Negative.   ?Musculoskeletal:   ?     Swelling hands and feet  ?Neurological:  Positive for headaches.  ? ?Physical Exam  ? ?Patient Vitals for the past 24 hrs: ? BP Temp Temp src Pulse Resp SpO2 Height Weight  ?01/21/22 1900 (!) 103/47 -- -- (!) 106 -- -- -- --  ?01/21/22 1850 128/81 -- -- (!) 107 -- 96 % -- --  ?01/21/22 1840 131/61 -- -- (!) 104 17 97 % -- --  ?01/21/22 1825 108/66 -- -- (!) 103 -- 98 % -- --  ?01/21/22 1800 116/70 -- -- (!) 102 -- 98 % -- --  ?01/21/22 1750 (!) 141/69 -- -- (!) 105 -- 97 % -- --  ?01/21/22 1730 124/77 -- -- (!) 123 17 97 % -- --  ?01/21/22 1715 -- -- -- -- -- 98 % -- --  ?01/21/22 1700 (!) 138/104 -- -- (!) 118 17 98 % -- --  ?01/21/22 1631 119/89 98.2 ?F (36.8 ?C) Oral (!) 138 17 97 % -- --  ?01/21/22 1616 137/89 98 ?F (36.7 ?C) Oral (!) 105 18 100 % -- --  ?01/21/22 1610 -- -- -- -- -- -- 5\' 6"  (1.676 m) 100.7 kg  ? ?Physical Exam ?Vitals and nursing note reviewed.  ?Constitutional:   ?   General: She is not in acute distress. ?   Appearance: She is obese.  ?Eyes:  ?   Extraocular Movements: Extraocular movements intact.  ?   Pupils: Pupils are equal, round, and reactive to light.  ?Cardiovascular:  ?   Rate and Rhythm: Tachycardia present.  ?Pulmonary:  ?   Effort: Pulmonary effort is normal.  ?Abdominal:  ?   Palpations: Abdomen is soft.  ?   Tenderness: There is no abdominal tenderness.  ?   Comments: Gravid ?  ?Musculoskeletal:     ?   General: Normal range of motion.   ?   Right hand: Swelling present.  ?   Left hand: Swelling present.  ?  Cervical back: Normal range of motion.  ?   Right ankle: No swelling.  ?   Left ankle: No swelling.  ?   Right foot: No swelling.  ?   Left foot: No swelling.  ?Skin: ?   General: Skin is warm and dry.  ?Neurological:  ?   General: No focal deficit present.  ?   Mental Status: She is alert and oriented to person, place, and time.  ?   Comments: DTR's +1, no clonus  ?Psychiatric:     ?   Mood and Affect: Mood normal.     ?   Behavior: Behavior normal.     ?   Thought Content: Thought content normal.     ?   Judgment: Judgment normal.  ? ?NST ?FHR: 135 bpm, moderate variability, +15x15 accels, no decels ?Toco: occ ui ? ?MAU Course  ?Procedures ?NST ?CBC, CMP, UPCR ?PO Reglan and Benadryl ? ?MDM ?BP's mostly normotensive. Had 2 mild range. Labs reassuring. Patient was given PO Reglan and Benadryl and continues to rate headache 6/10. I ordered a dose of IV caffeine and patient reported that her ride was here and that she had to go. I discussed risks of leaving with patient and her significant other and that I highly recommend staying given no improvement in headache. Patient reports that they have already paid for ride and cannot get a refund and that she has an appointment tomorrow and will go to that appointment. Patient signed AMA form. I instructed patient to keep OB appointment tomorrow and gave strict return precautions.  ? ?Assessment and Plan  ?[redacted] weeks gestation of pregnancy ?Headache affecting pregnancy ? ?- Patient left AMA ? ? ?Brand Males, CNM ?01/21/2022, 8:25 PM  ?

## 2022-01-21 NOTE — Telephone Encounter (Signed)
Called and spoke with patient. Reviewed with her that she needs to come to her appointments for a determination to be made. She reports she has transportation issues and does not have a car. Reviewed with her that we can provide transportation as needed. Patient said she was not aware.  ? ? ?She reports her seizures have increased and her blood pressure has been elevated. She reports 150-160/80-120. She did go to MAU on 2/21 and Lamictal was added for seizures. She is not on any BP Medication. She has had a headache for at least 4-5 days. She has tried Tylenol and Fioricet and neither are helping. +She has blurry vision that comes and goes and has been happening since before she went to the hospital.  ? ?Had patient take BP while on the phone, BP today: 165/112 right arm. She reports it said her pulse was 185. She reports swelling to hands and feet in the last few days, she reports she has been moving.  ? ?She repeated the BP and pulse was 115. Repeat BP 135/76 left arm.  ? ?Spoke with Dr. Elgie Congo and he recommended that she go to MAU for evaluation. Patient reports understanding. She reports her husband can take her once her mom returns in about 20 minutes.  ? ?Appointment moved up to tomorrow at 1:15. Patient notified and front desk notified to schedule transportation.  ? ?Called Noni Saupe, NP in MAU and gave report.  ?

## 2022-01-22 ENCOUNTER — Ambulatory Visit (INDEPENDENT_AMBULATORY_CARE_PROVIDER_SITE_OTHER): Payer: Medicaid Other | Admitting: Obstetrics and Gynecology

## 2022-01-22 ENCOUNTER — Encounter: Payer: Self-pay | Admitting: Obstetrics and Gynecology

## 2022-01-22 ENCOUNTER — Other Ambulatory Visit (HOSPITAL_COMMUNITY)
Admission: RE | Admit: 2022-01-22 | Discharge: 2022-01-22 | Disposition: A | Discharge status: Home / Self Care | Payer: Medicaid Other | Source: Ambulatory Visit | Attending: Obstetrics & Gynecology | Admitting: Obstetrics & Gynecology

## 2022-01-22 VITALS — BP 140/80 | HR 75 | Wt 220.3 lb

## 2022-01-22 DIAGNOSIS — O0990 Supervision of high risk pregnancy, unspecified, unspecified trimester: Secondary | ICD-10-CM | POA: Diagnosis present

## 2022-01-22 DIAGNOSIS — O09299 Supervision of pregnancy with other poor reproductive or obstetric history, unspecified trimester: Secondary | ICD-10-CM

## 2022-01-22 DIAGNOSIS — O99350 Diseases of the nervous system complicating pregnancy, unspecified trimester: Secondary | ICD-10-CM

## 2022-01-22 DIAGNOSIS — G40909 Epilepsy, unspecified, not intractable, without status epilepticus: Secondary | ICD-10-CM

## 2022-01-22 DIAGNOSIS — O133 Gestational [pregnancy-induced] hypertension without significant proteinuria, third trimester: Secondary | ICD-10-CM

## 2022-01-22 NOTE — Patient Instructions (Addendum)
AREA PEDIATRIC/FAMILY PRACTICE PHYSICIANS  [place]outheast [place] ([address])  [facility] Chambliss, MD; Eniola, MD; Hale, MD; Hensel, MD; McDiarmid, MD; McIntyer, MD; Neal, MD; Walden, MD [address] [place], [place] [address] [phone] Mon-Fri 8:30-12:30, 1:30-5:00 Providers come to see babies at [facility] Accepting Medicaid [facility] providers who accept newborns: Koirala, MD; Morrow, MD; Wolters, MD [address], [place], [place] [address] [phone] Mon-Fri 8:00-5:30 Babies seen by providers at [facility] Does NOT accept Medicaid Please call early in hospitalization for appointment (limited availability)  [facility] [place], MD [address] [place], [place] [address] [phone] Mon, Tue, Thur, Fri 8:30-5:00, Wed 10:00-7:00 (closed 1-2pm) Babies seen by [facility] providers Accepting Medicaid Rubin - Pediatrician Rubin, MD [address], [place], [place] [address] [phone] Mon-Fri 8:30-5:00, Sat 8:30-12:00 Provider comes to see babies at [facility] Accepting Medicaid Must have been referred from current patients or contacted office prior to delivery [facility] ([facility]) Brown, MD; Chandler, MD; Ettefagh, MD; Grant, MD; Lester, MD; McCormick, MD; McQueen, MD; Prose, MD; Simha, MD; Stanley, MD; Stryffeler, NP; Tebben, NP [address], [place], [place] [address] [phone] Mon, Tue, Thur, Fri 8:30-5:30, Wed 9:30-5:30, Sat 8:30-12:30 Babies seen by [facility] providers Accepting Medicaid Only accepting infants of first-time parents or siblings of current patients Hospital discharge coordinator will make follow-up appointment Jack Amos [address], [place], [place]  [address] [phone]   Fax - [phone] [facility] [address], [place], [place]  [address] Phone - [phone]   Fax - [phone] Shilpa Gosrani [address], [place], [place]  [address] [phone]  [place] ([address]) [facility] Bates, MD; [place]ass[place], MD; Cooper, Cox, MD; MD; Davis, MD; Dovico, MD; Ettefaugh, MD; Little, MD; Lowe, MD; Keiffer, MD; Melvin, MD; Sumner, MD; Williams, MD [address], [place], [place] [address] [phone] Mon-Fri 8:30-5:00 (extended evenings Mon-Thur as needed), Sat-Sun 10:00-1:00 Providers come to see babies at [facility] Accepting Medicaid for families of first-time babies and families with all children in the household age 3 and under. Must register with office prior to making appointment (M-F only). [facility] Henson, NP; Knapp, MD; Lalonde, MD; Tysinger, PA [address] [place], [place] [address] [phone] Mon-Fri 8:00-5:00 Babies seen by providers at [facility] Does NOT accept Medicaid/Commercial Insurance Only [facility] [place] ([facility])  Artis, MD; Barnes, MD; Bratton, MD; Coccaro, MD; [place] Gardner, MD; Kramer, MD; Marshall, MD; Netherton, MD; Poleto, MD; Skinner, MD [address] [place], [place] [address] [phone] Mon-Fri 8:30-5:30, Sat (Oct.-Mar.) 9:00-1:00 Babies seen by providers at [facility] Accepting Medicaid  [place] ([address]) [facility] Reid, MD; Warner, MD [address], [place], [place] [address] [phone] Mon-Fri 8:30-5:00, Sat 8:30-12:00 Providers come to see babies at [facility] Does NOT accept Medicaid [facility] Becker, PA; Hagler, MD; Scifres, PA; Sun, MD; Swayne, MD [address], [place],  [address] [phone] Mon-Fri 8:00-5:00 Babies seen by providers at [facility] Does NOT accept Medicaid Only accepting babies of parents who  are patients Please call early in hospitalization for appointment (limited availability)  Pediatricians Clark, MD; Frye, MD; Kelleher, MD; Mack, NP; Miller, MD; O'Keller, MD; Patterson, NP; Pudlo, MD; Puzio, MD; Thomas, MD; Tucker, MD; Twiselton, MD [address], [place], [place] [address] [phone] Mon-Fri 8:00-5:00, Sat 9:00-12:00 Providers come to see babies at [facility] Does NOT accept Medicaid  [place] ([address]) [facility] providers accepting new patients: Brake, NP; Wharton, PA [address], [place], [place] [address] [phone] Mon-Fri 8:00-5:00 Babies seen by providers at [facility] Does NOT accept Medicaid Only accepting babies of parents who are patients Please call early in hospitalization for appointment (limited availability) [facility] Gay, MD; Quinlan, MD [address] [place], [place] [address] [phone] (press 1 to schedule appointment) Mon-Fri 8:00-5:00 Providers come to see babies at [facility] Does NOT accept Medicaid [facility] Mazer, MD [address] [place], [place] [address] [phone] Mon-Fri 8:30-5:00 (lunch 12:30-1:00), extended hours by appointment only Wed 5:00-6:30 Babies seen by [facility] providers Accepting Medicaid [facility] Banks, MD; Jordan, MD; Koberlein, MD [address], [place], [place] [address] [phone] Mon-Fri 8:00-5:00 Babies seen by [facility] providers Does NOT accept Medicaid [facility] Parker, MD; Hunter, MD; Wallace, DO [address] [place], [place] [address] [phone] Mon-Fri 8:00-5:00 Babies seen by [facility] providers Does NOT accept Medicaid [facility] Brandon, PA; Brecken, PA; Christy, NP; Dees, MD; DeClaire, MD; DeWeese, MD; Hansen, NP; Mills, NP; Parrish, NP; Smoot, NP; Summer, MD; Vapne,  MD [address] [place], [place] [address] [phone] Mon-Fri 8:30-5:00, Sat 10:00-1:00 Providers come to see babies at [facility] Does NOT accept Medicaid Free prenatal information session Tuesdays at 4:45pm [facility] Bouska, MD; Gordon, PA; Jeffery, PA; Weber, PA [address] [place] [address] [phone] Mon-Fri 7:30-5:30 Babies seen by [facility] providers [facility] [address], [place], [place]  [address] [phone]   Fax - [phone]  [place] ([address] & [address]) [facility] [place] [address] [place], [place] [address] [phone] Mon-Thur 8:00-6:00 Providers come to see babies at [facility] Accepting Medicaid [facility] Anderson, NP; Badger, MD; Beal, PA; Spencer, PA [address] [place], [place] [address] [phone] Mon-Thur 7:30-7:30, Fri 7:30-4:30 Babies seen by [facility] providers Accepting Medicaid [facility] Agbuya, MD; Klett, NP; Romgoolam, MD [address], [place], [place] [address] [phone] Mon-Fri 8:30-5:00, Sat 8:30-12:00 Providers come to see babies at [facility] Accepting Medicaid Must have "Meet & Greet" appointment at office prior to delivery [facility] ([facility]) McCord, MD; Wallace, MD; Wood, MD [address], [place], [place] [address] [phone] Mon-Wed 8:00-6:00, Thur-Fri 8:00-5:00, Sat 9:00-12:00 Providers come to see babies at [facility] Does NOT accept Medicaid Only accepting siblings of current patients [facility]  [address], [place], [place]  [address] [phone]   Fax - [phone] [facility] [address], [place], [place]  [address] [phone]   Fax -  [phone]  [place] ([address] & [address])  [facility] Cirigliano, DO; Matthews, DO [address] [place],  [address] [phone] Mon-Fri 7:00-5:00 Babies seen by [facility] providers Does NOT accept Medicaid [facility] Briscoe, MD; Howley, PA; Moreira, PA [address], [place],  [address] [phone] Mon-Fri 8:00-5:00 Babies seen by [facility] providers Accepting Medicaid [facility] Boyd, MD; Church, PA; Jones, NP; Osborn, PA [address], [place],  [address] [phone] Mon-Fri 8:00-5:00 Babies seen by providers at [facility] Accepting Medicaid  [place] ([address]) [facility] [place] Wendling, DO [address] [place], [place] [address] [phone] Mon-Fri 8:00-5:00 Babies seen by [facility] providers Does NOT accept Medicaid Limited availability, please call early in hospitalization to schedule follow-up [facility] Calderon, PA; Cummings, MD; Dillard, MD; Martin, PA; Olson, MD; VanDeven, PA [address], [place], [place] [address] [phone] Mon-Fri 8:30-5:00, Sat 9:00-12:00 Babies seen by providers at [facility] Accepting Medicaid Please register online then schedule online or call office [email or web address] ([facility]) Hunter, NP; Kumar, MD; Martin Rogers, PA [address], [place], [place] [address] [phone] Mon-Fri 8:00-5:00 Babies seen by providers at [facility] Accepting Medicaid [facility] ([facility]) [place], MD; Kristi Fleenor, NP; West, MD [address], [place], [place] [address] [phone] Mon-Fri 8:00-5:30, Sat&Sun by appointment (phones open at  8:30) Babies seen by [facility] providers Accepting Medicaid Must be a first-time baby or sibling of current patient [facility] [place]  [address], [place], [place]  [address] [phone]   Fax - [phone]  [place] ([address]) [facility] Brown, PA; Cowen, PA; Rice, MD; Helton, PA; Spry, MD [address] [place], [place] [address] [phone] Mon-Thur 8:00-7:00, Fri 8:00-5:00, Sat 8:00-12:00, Sun 9:00-12:00 Babies seen by [facility] providers Accepting Medicaid [facility] [place] Coe-Goins, MD; Marshall, MD; Pierre-Louis, MD [address] [place] [address], [place], [place] [address] [phone] Mon-Thur 8:00-5:00 Babies seen by providers at [facility] Accepting Medicaid [facility] Bratton, MD; Coe-Goins, MD; Hayes, MD; Lewis, MD; List, MD; Lott, MD; Marshall, MD; Moran, MD; O'Neal, MD; Pierre-Louis, MD; Pitonzo, MD; Scholer, MD; Spangle, MD [address] [place], [place] [address] [phone] Mon-Fri 8:00-5:30, Sat (Oct.-Mar.) 9:00-1:00 Babies seen by providers at [facility] Accepting Medicaid Must fill out new patient packet, available online at [email or web address] [facility] ([facility]) Friddle, NP; Harris, NP; Kelly, NP; Logan, MD; Melvin, PA; Poth, MD; Ramadoss, MD; Stanton, NP [address], [place], [place] [address] [phone] Mon-Thur 8:00-5:30, Fri 8:00-5:00 Babies seen by providers at [facility] Accepting Medicaid  [place] ([address]) [facility] Dixon, PA; Aromas, MD; Pickard, MD; Tapia, PA [address], [place], [place] [address] [phone] Mon-Fri 8:00-5:00 Babies seen by providers at [facility] Accepting Medicaid   [place] ([address]) [facility] [place] Masneri, DO; Meyers, MD; Nelson, PA [address], [place], [place] [address] [phone] Mon-Fri 8:00-5:00 Babies seen by providers at [facility] Does NOT accept Medicaid Limited appointment availability, please call early in hospitalization  [facility] [place] Kunedd, DO; McGowen, MD [address], [place], [place] [address] [phone] Mon-Fri 8:00-5:00 Babies seen by [facility] providers Does NOT accept Medicaid [facility] [place] Cameron, MD; MacDonald, MD; Michaels, PA; Nayak, MD [address] [place] [address], [place], [place] [address] [phone] Mon-Fri 8:00-5:00 After hours clinic ([address] [place], [place] [address]) [phone] Mon-Fri 5:00-8:00, Sat 12:00-6:00, Sun 10:00-4:00 Babies seen by [facility] providers Accepting Medicaid [facility] [place] [address] [place], [place]  [address] [phone]   Fax - [phone]  [place] [phone]) [place] [facility] [place] [address] [place], [place], [place] [address] [phone] Mon-Fri 8:00-5:00 Babies seen by [facility] providers Does NOT accept Medicaid [place] [facility] [place]) Daron Offer, [place] [address], [place], [place] [address] [phone] Mon-Thur 8:00-7:00, Fri 8:00-5:00, Sat 8:00-12:00 Babies seen by providers at [facility] Accepting Medicaid - but does not have vaccinations in office (must be received elsewhere) Limited availability, please call early in hospitalization  [place] [phone]) [place], MD [address] [place] [address] [phone]  Fax [phone]  [facility]  Lauretta [place], MD, [place], [place], [place], [place], [place], [place]  [address] [facility]  [place], [place], [place] [address] [place], [place], [place] [address] [phone] ([place])  [facility] [address] [place], [place] [address] [place], [place], [place] [address] [phone] [facility] [address] [place], [place], [place] [address] [phone] [facility] [address] [place], [place] [address] [place], [place], [place] [address] [facility] [address] [place], [place] [address] [phone] [place] [place] [address], [place], [place] [address] [phone] Dr. Okey Regal. [place] [address] [place], [place] [address] [facility] [address] [place] [address], [place], [place] [address] [facility] [address] [place], [place] [address] [phone]     Vaginal Delivery Vaginal delivery means that you give birth by pushing your baby out of your birth canal (vagina). Your health care team will help you before, during, and after vaginal delivery. Birth experiences are unique for every woman and every pregnancy, and birth experiences vary depending on where you choose to give birth. What are the risks and benefits? Generally, this is safe. However, problems may occur, including: Bleeding. Infection. Damage to other structures such as vaginal tearing. Allergic reactions to medicines. Despite the risks, benefits of vaginal delivery include less risk of bleeding and infection and a shorter recovery time compared to a Cesarean delivery. Cesarean delivery, or C-section, is the surgical delivery of a baby. What happens when I arrive at the birth center or hospital? Once you are in labor and have been admitted into the hospital or birth center, your health  care team may: Review your pregnancy history and any concerns that you have. Talk with you about your birth plan and discuss pain control options. Check your blood pressure, breathing, and heartbeat. Assess your baby's heartbeat. Monitor your uterus for contractions. Check whether your bag of water (amniotic sac) has broken (ruptured). Insert an IV into one of your veins. This may be used to give you fluids and medicines. Monitoring Your health care team may assess your contractions (uterine monitoring) and your baby's heart rate (fetal monitoring). You may need to be monitored: Often, but not continuously (intermittently). All the time or for long periods at a time (continuously). Continuous monitoring may be needed if: You are taking certain medicines, such as medicine to relieve pain or make your contractions stronger. You have pregnancy or labor complications. Monitoring may be done by: Placing a special stethoscope or a handheld monitoring device on your abdomen to check your baby's heartbeat and to check for  contractions. Placing monitors on your abdomen (external monitors) to record your baby's heartbeat and the frequency and length of contractions. Placing monitors inside your uterus through your vagina (internal monitors) to record your baby's heartbeat and the frequency, length, and strength of your contractions. Depending on the type of monitor, it may remain in your uterus or on your baby's head until birth. Telemetry. This is a type of continuous monitoring that can be done with external or internal monitors. Instead of having to stay in bed, you are able to move around. Physical exam Your health care team may perform frequent physical exams. This may include: Checking how and where your baby is positioned in your uterus. Checking your cervix to determine: Whether it is thinning out (effacing). Whether it is opening up (dilating). What happens during labor and delivery? Normal  labor and delivery is divided into the following three stages: Stage 1 This is the longest stage of labor. Throughout this stage, you will feel contractions. Contractions generally feel mild, infrequent, and irregular at first. They get stronger, more frequent, and more regular as you move through this stage. You may have contractions about every 2-3 minutes. This stage ends when your cervix is completely dilated to 4 inches (10 cm) and completely effaced. Stage 2 This stage starts once your cervix is completely effaced and dilated and lasts until the delivery of your baby. This is the stage where you will feel an urge to push your baby out of your vagina. You may feel stretching and burning pain, especially when the widest part of your baby's head passes through the vaginal opening (crowning). Once your baby is delivered, the umbilical cord will be clamped and cut. Timing of cutting the cord will depend on your wishes, your baby's health, and your health care provider's practices. Your baby will be placed on your bare chest (skin-to-skin contact) in an upright position and covered with a warm blanket. If you are choosing to breastfeed, watch your baby for feeding cues, like rooting or sucking, and help the baby to your breast for his or her first feeding. Stage 3 This stage starts immediately after the birth of your baby and ends after you deliver the placenta. This stage may take anywhere from 5 to 30 minutes. After your baby has been delivered, you will feel contractions as your body expels the placenta. These contractions also help your uterus get smaller and reduce bleeding. What can I expect after labor and delivery? After labor is over, you and your baby will be assessed closely until you are ready to go home. Your health care team will teach you how to care for yourself and your baby. You and your baby may be encouraged to stay in the same room (rooming in) during your hospital stay. This  will help promote early bonding and successful breastfeeding. Your uterus will be checked and massaged regularly (fundal massage). You may continue to receive fluids and medicines through an IV. You will have some soreness and pain in your abdomen, vagina, and the area of skin between your vaginal opening and your anus (perineum). If an incision was made near your vagina (episiotomy) or if you had some vaginal tearing during delivery, cold compresses may be placed on your episiotomy or your tear. This helps to reduce pain and swelling. It is normal to have vaginal bleeding after delivery. Wear a sanitary pad for vaginal bleeding and discharge. Summary Vaginal delivery means that you will give birth by pushing your baby out  of your birth canal (vagina). Your health care team will monitor you and your baby throughout the stages of labor. After you deliver your baby, your health care team will continue to assess you and your baby to ensure you are both recovering as expected after delivery. This information is not intended to replace advice given to you by your health care provider. Make sure you discuss any questions you have with your health care provider. Document Revised: 10/07/2020 Document Reviewed: 10/07/2020 Elsevier Patient Education  2022 [employer].

## 2022-01-22 NOTE — Progress Notes (Signed)
Subjective:  ?KAULA HOGSED is a 26 y.o. [phone] at [redacted]w[redacted]d being seen today for ongoing prenatal care.  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 ? ?Patient reports general discomforts of pregnancy. .  Contractions: Irritability. Vag. Bleeding: None.  Movement: Present. Denies leaking of fluid.  ? ?The following portions of the patient's history were reviewed and updated as appropriate: allergies, current medications, past family history, past medical history, past social history, past surgical history and problem list. Problem list updated. ? ?Objective:  ? ?Vitals:  ? 01/22/22 1322  ?BP: 140/80  ?Pulse: 75  ?Weight: 220 lb 4.8 oz (99.9 kg)  ? ? ?Fetal Status: Fetal Heart Rate (bpm): 159   Movement: Present    ? ?General:  Alert, oriented and cooperative. Patient is in no acute distress.  ?Skin: Skin is warm and dry. No rash noted.   ?Cardiovascular: Normal heart rate noted  ?Respiratory: Normal respiratory effort, no problems with respiration noted  ?Abdomen: Soft, gravid, appropriate for gestational age. Pain/Pressure: Present     ?Pelvic:  Cervical exam performed        ?Extremities: Normal range of motion.  Edema: None  ?Mental Status: Normal mood and affect. Normal behavior. Normal judgment and thought content.  ? ?Urinalysis:     ? ?Assessment and Plan:  ?Pregnancy: [id number] at [redacted]w[redacted]d ? ?1. Supervision of high risk pregnancy,  antepartum ?GBS and vaginal cultures today ? ?2. Gestational hypertension, third trimester ?IOL scheduled for 37 weeks ?Growth and BPP tomorrow ? ?3. Seizure disorder during pregnancy, antepartum ([place]) ?Stable ?Continue with Keppra ? ?4. Hx of preeclampsia, prior pregnancy, currently pregnant ?Stable ? ?Term labor symptoms and general obstetric precautions including but not limited to vaginal bleeding, contractions, leaking of fluid and fetal movement were reviewed in detail with the patient. ?Please refer to After Visit Summary for other counseling recommendations.  ?No follow-ups on file. ? ? ?Chancy Milroy, MD ?

## 2022-01-23 ENCOUNTER — Inpatient Hospital Stay (HOSPITAL_COMMUNITY): Payer: Medicaid Other | Admitting: Anesthesiology

## 2022-01-23 ENCOUNTER — Other Ambulatory Visit: Payer: Self-pay

## 2022-01-23 ENCOUNTER — Ambulatory Visit (HOSPITAL_BASED_OUTPATIENT_CLINIC_OR_DEPARTMENT_OTHER): Payer: Medicaid Other

## 2022-01-23 ENCOUNTER — Ambulatory Visit: Payer: Medicaid Other | Admitting: *Deleted

## 2022-01-23 ENCOUNTER — Inpatient Hospital Stay (HOSPITAL_COMMUNITY)
Admission: AD | Admit: 2022-01-23 | Discharge: 2022-01-26 | DRG: 806 | Disposition: A | Discharge status: Home / Self Care | Payer: Medicaid Other | Attending: Obstetrics and Gynecology | Admitting: Obstetrics and Gynecology

## 2022-01-23 ENCOUNTER — Telehealth (HOSPITAL_COMMUNITY): Payer: Self-pay | Admitting: *Deleted

## 2022-01-23 ENCOUNTER — Ambulatory Visit (HOSPITAL_BASED_OUTPATIENT_CLINIC_OR_DEPARTMENT_OTHER): Payer: Medicaid Other | Admitting: Obstetrics

## 2022-01-23 ENCOUNTER — Encounter (HOSPITAL_COMMUNITY): Payer: Self-pay

## 2022-01-23 ENCOUNTER — Other Ambulatory Visit: Payer: Self-pay | Admitting: Obstetrics and Gynecology

## 2022-01-23 ENCOUNTER — Encounter (HOSPITAL_COMMUNITY): Payer: Self-pay | Admitting: *Deleted

## 2022-01-23 VITALS — BP 121/87 | HR 123

## 2022-01-23 DIAGNOSIS — O09293 Supervision of pregnancy with other poor reproductive or obstetric history, third trimester: Secondary | ICD-10-CM | POA: Insufficient documentation

## 2022-01-23 DIAGNOSIS — G40909 Epilepsy, unspecified, not intractable, without status epilepticus: Secondary | ICD-10-CM

## 2022-01-23 DIAGNOSIS — Z880 Allergy status to penicillin: Secondary | ICD-10-CM | POA: Diagnosis not present

## 2022-01-23 DIAGNOSIS — O368390 Maternal care for abnormalities of the fetal heart rate or rhythm, unspecified trimester, not applicable or unspecified: Secondary | ICD-10-CM | POA: Insufficient documentation

## 2022-01-23 DIAGNOSIS — O99353 Diseases of the nervous system complicating pregnancy, third trimester: Secondary | ICD-10-CM

## 2022-01-23 DIAGNOSIS — Z3A36 36 weeks gestation of pregnancy: Secondary | ICD-10-CM

## 2022-01-23 DIAGNOSIS — Z7982 Long term (current) use of aspirin: Secondary | ICD-10-CM

## 2022-01-23 DIAGNOSIS — Z8279 Family history of other congenital malformations, deformations and chromosomal abnormalities: Secondary | ICD-10-CM

## 2022-01-23 DIAGNOSIS — O99354 Diseases of the nervous system complicating childbirth: Secondary | ICD-10-CM | POA: Diagnosis present

## 2022-01-23 DIAGNOSIS — Z87891 Personal history of nicotine dependence: Secondary | ICD-10-CM | POA: Diagnosis not present

## 2022-01-23 DIAGNOSIS — O99214 Obesity complicating childbirth: Secondary | ICD-10-CM | POA: Diagnosis present

## 2022-01-23 DIAGNOSIS — O09299 Supervision of pregnancy with other poor reproductive or obstetric history, unspecified trimester: Secondary | ICD-10-CM

## 2022-01-23 DIAGNOSIS — Z30430 Encounter for insertion of intrauterine contraceptive device: Secondary | ICD-10-CM | POA: Diagnosis not present

## 2022-01-23 DIAGNOSIS — O134 Gestational [pregnancy-induced] hypertension without significant proteinuria, complicating childbirth: Secondary | ICD-10-CM | POA: Diagnosis present

## 2022-01-23 DIAGNOSIS — Z8759 Personal history of other complications of pregnancy, childbirth and the puerperium: Secondary | ICD-10-CM

## 2022-01-23 DIAGNOSIS — Z30014 Encounter for initial prescription of intrauterine contraceptive device: Secondary | ICD-10-CM | POA: Diagnosis not present

## 2022-01-23 DIAGNOSIS — F319 Bipolar disorder, unspecified: Secondary | ICD-10-CM | POA: Diagnosis present

## 2022-01-23 DIAGNOSIS — O99824 Streptococcus B carrier state complicating childbirth: Secondary | ICD-10-CM | POA: Diagnosis present

## 2022-01-23 DIAGNOSIS — Z20822 Contact with and (suspected) exposure to covid-19: Secondary | ICD-10-CM | POA: Diagnosis present

## 2022-01-23 DIAGNOSIS — J45909 Unspecified asthma, uncomplicated: Secondary | ICD-10-CM | POA: Diagnosis present

## 2022-01-23 DIAGNOSIS — O10919 Unspecified pre-existing hypertension complicating pregnancy, unspecified trimester: Secondary | ICD-10-CM | POA: Diagnosis present

## 2022-01-23 DIAGNOSIS — O139 Gestational [pregnancy-induced] hypertension without significant proteinuria, unspecified trimester: Secondary | ICD-10-CM | POA: Diagnosis present

## 2022-01-23 DIAGNOSIS — O0990 Supervision of high risk pregnancy, unspecified, unspecified trimester: Secondary | ICD-10-CM

## 2022-01-23 LAB — CBC
HCT: 34.9 % — ABNORMAL LOW (ref 36.0–46.0)
Hemoglobin: 11.9 g/dL — ABNORMAL LOW (ref 12.0–15.0)
MCH: 31.2 pg (ref 26.0–34.0)
MCHC: 34.1 g/dL (ref 30.0–36.0)
MCV: 91.4 fL (ref 80.0–100.0)
Platelets: 278 10*3/uL (ref 150–400)
RBC: 3.82 MIL/uL — ABNORMAL LOW (ref 3.87–5.11)
RDW: 12.7 % (ref 11.5–15.5)
WBC: 18.3 10*3/uL — ABNORMAL HIGH (ref 4.0–10.5)
nRBC: 0 % (ref 0.0–0.2)

## 2022-01-23 LAB — COMPREHENSIVE METABOLIC PANEL
ALT: 12 U/L (ref 0–44)
AST: 16 U/L (ref 15–41)
Albumin: 2.8 g/dL — ABNORMAL LOW (ref 3.5–5.0)
Alkaline Phosphatase: 92 U/L (ref 38–126)
Anion gap: 10 (ref 5–15)
BUN: 6 mg/dL (ref 6–20)
CO2: 19 mmol/L — ABNORMAL LOW (ref 22–32)
Calcium: 8.8 mg/dL — ABNORMAL LOW (ref 8.9–10.3)
Chloride: 107 mmol/L (ref 98–111)
Creatinine, Ser: 0.5 mg/dL (ref 0.44–1.00)
GFR, Estimated: >60 mL/min (ref >60)
Glucose, Bld: 106 mg/dL — ABNORMAL HIGH (ref 70–99)
Potassium: 3.7 mmol/L (ref 3.5–5.1)
Sodium: 136 mmol/L (ref 135–145)
Total Bilirubin: 0.2 mg/dL — ABNORMAL LOW (ref 0.3–1.2)
Total Protein: 6.1 g/dL — ABNORMAL LOW (ref 6.5–8.1)

## 2022-01-23 LAB — TYPE AND SCREEN
ABO/RH(D): O POS
Antibody Screen: NEGATIVE

## 2022-01-23 LAB — PROTEIN / CREATININE RATIO, URINE
Creatinine, Urine: 237.23 mg/dL
Protein Creatinine Ratio: 0.15 mg/mg{Cre} (ref 0.00–0.15)
Total Protein, Urine: 35 mg/dL

## 2022-01-23 LAB — RESP PANEL BY RT-PCR (FLU A&B, COVID) ARPGX2
Influenza A by PCR: NEGATIVE
Influenza B by PCR: NEGATIVE
SARS Coronavirus 2 by RT PCR: NEGATIVE

## 2022-01-23 LAB — GC/CHLAMYDIA PROBE AMP (~~LOC~~) NOT AT ARMC
Chlamydia: NEGATIVE
Comment: NEGATIVE
Comment: NORMAL
Neisseria Gonorrhea: NEGATIVE

## 2022-01-23 IMAGING — US US MFM OB FOLLOW-UP
1 series · 14 of 28 positions shown · non-contrast
Comparison: none

[Series 1: us mfm ob follow-up · 32 acquisitions, 14 frames shown]
[im 2/32]
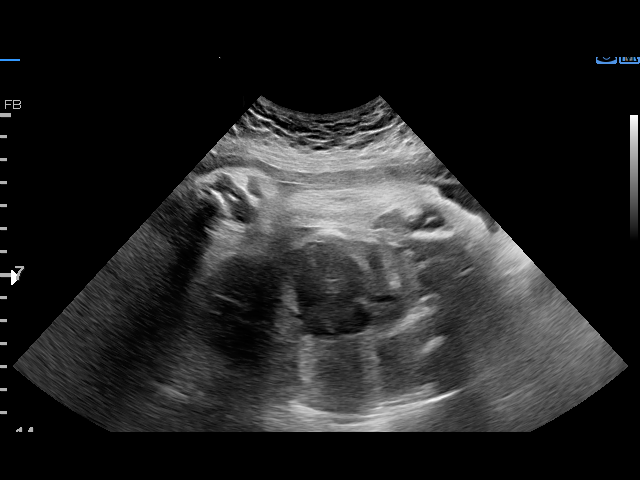
[im 4/32]
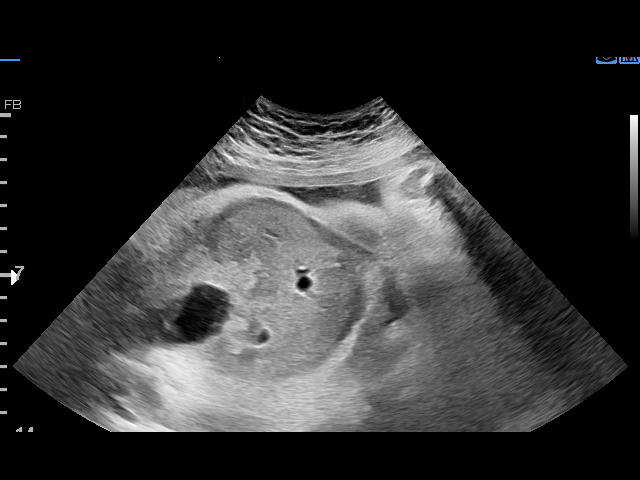
[im 6/32]
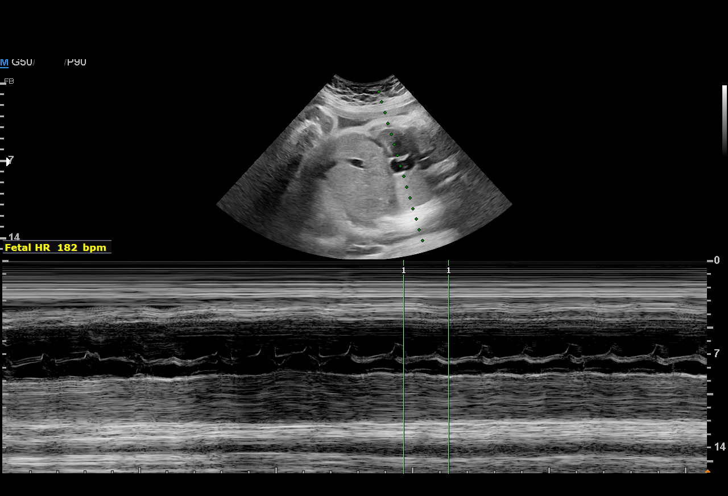
[im 9/32]
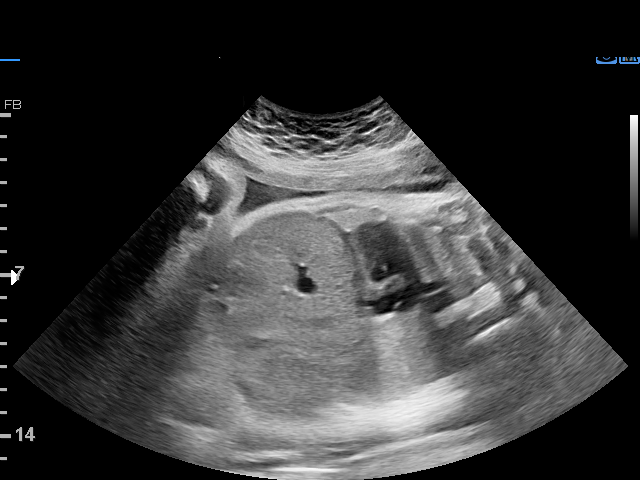
[im 11/32]
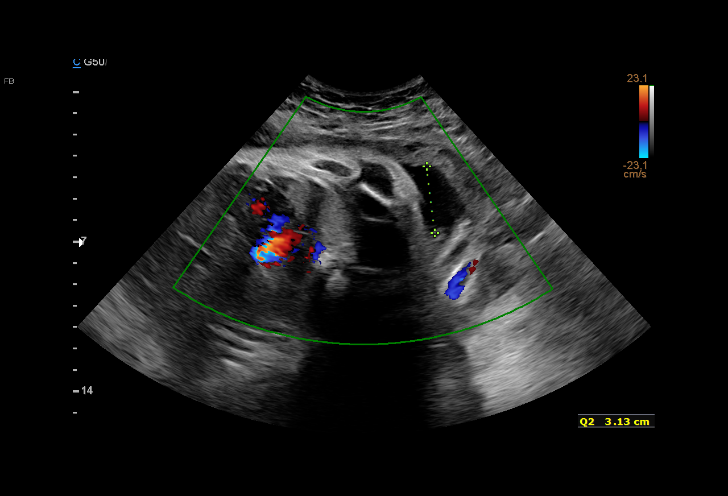
[im 13/32]
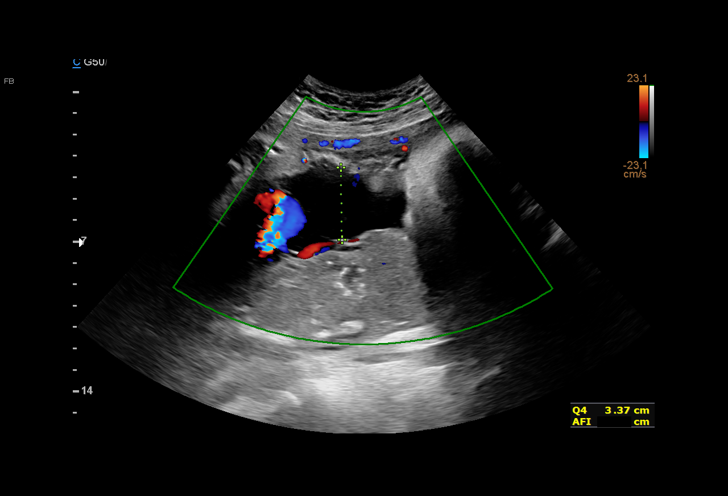
[im 15/32]
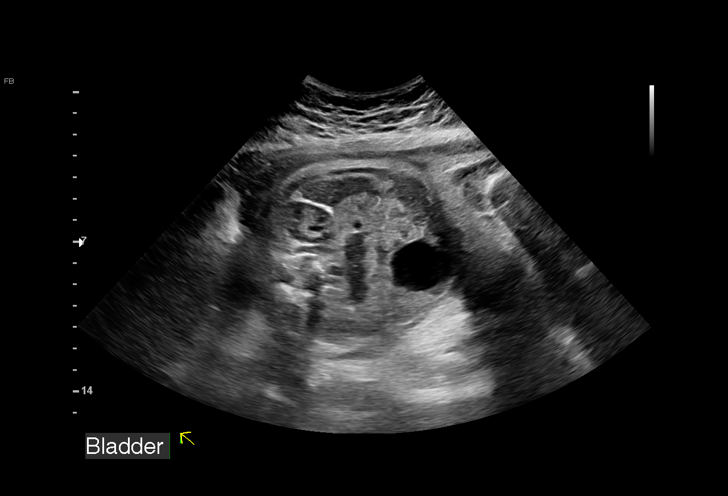
[im 18/32]
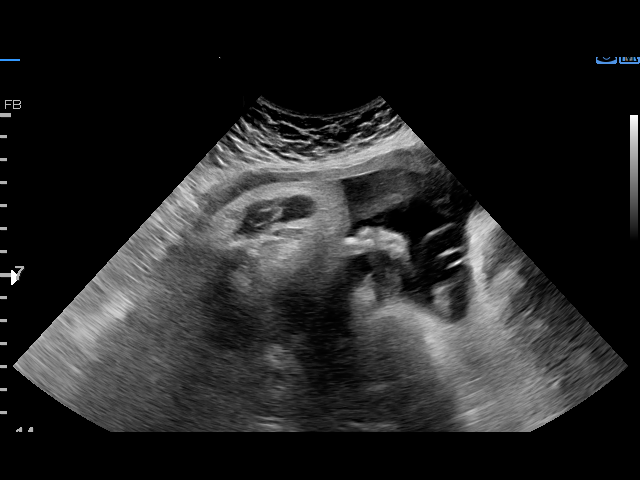
[im 20/32]
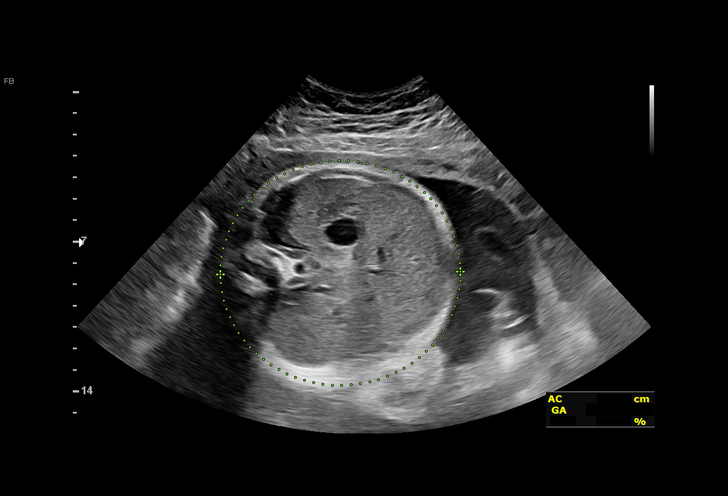
[im 22/32]
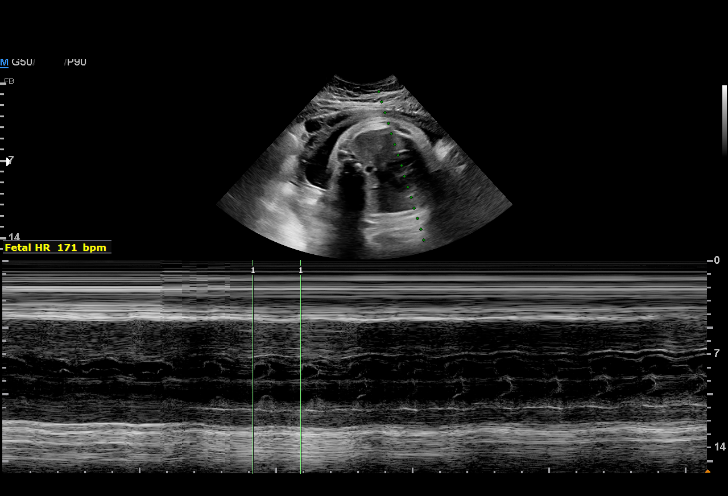
[im 25/32]
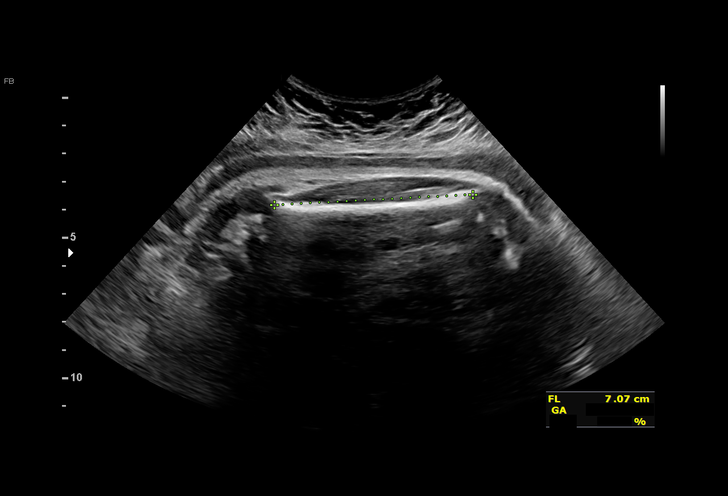
[im 27/32]
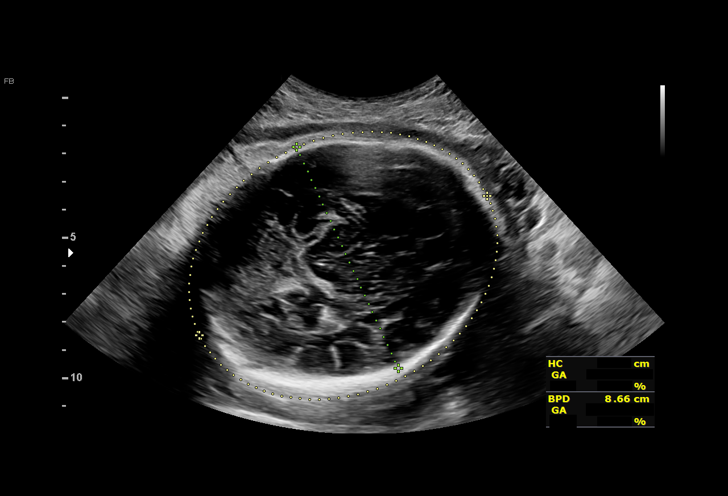
[im 29/32]
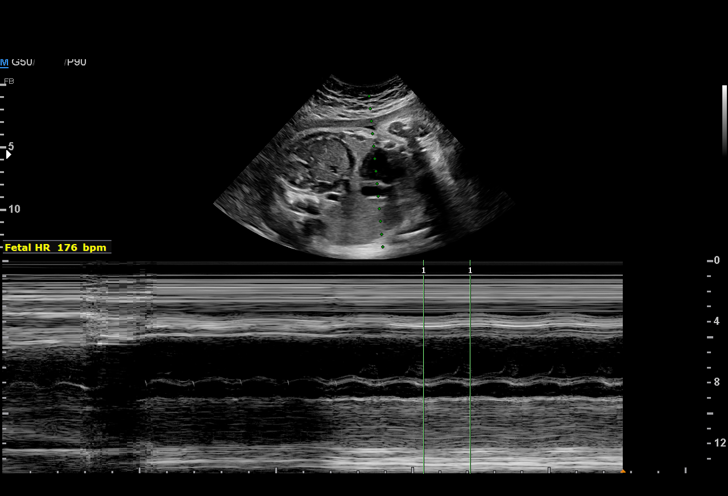
[im 32/32]
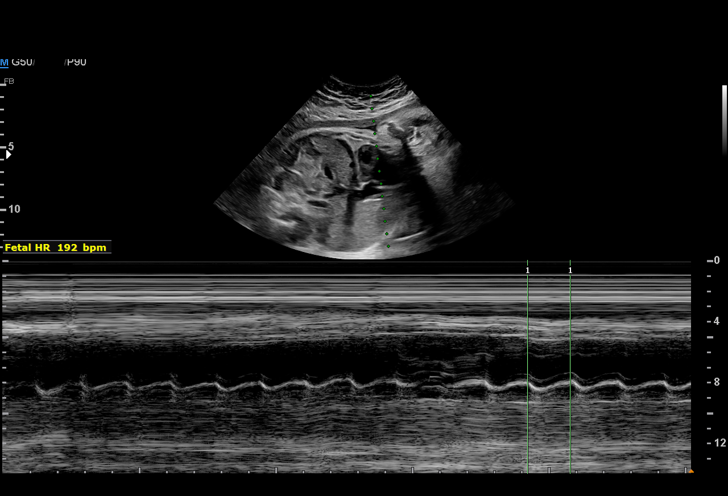

[14 of 28 positions shown; findings below may reference images not displayed]

W/NONSTRESS

Indications

 Seizure disorder (Keppra)                      [5S] [5S]
 Prior poor obstetrical history antepartum,     [5S]
 second trimester (term IUFD)
 Previous pregnacy with congenital heart        [5S]
 (cardiac) defect x 2 (no surg)
 Normal Fetal ECHO
 Normal Integrated Screen
 Encounter for other antenatal screening        [5S]
 follow-up
 Poor obstetric history: Previous preeclampsia  [5S]
 36 weeks gestation of pregnancy
Fetal Evaluation

 Num Of Fetuses:         1
 Fetal Heart Rate(bpm):  180
 Cardiac Activity:       Tachycardia
 Presentation:           Cephalic
 Placenta:               Posterior
 P. Cord Insertion:      Previously Visualized

 Amniotic Fluid
 AFI FV:      Within normal limits

 AFI Sum(cm)     %Tile       Largest Pocket(cm)
 16.5            61
 RUQ(cm)       RLQ(cm)       LUQ(cm)        LLQ(cm)

Biophysical Evaluation

 Amniotic F.V:   Pocket => 2 cm             F. Tone:        Observed
 F. Movement:    Observed                   N.S.T:          Reactive
 F. Breathing:   Observed                   Score:          [DATE]
Biometry

 BPD:      87.1  mm     G. Age:  35w 1d         34  %    CI:        73.58   %    70 - 86
                                                         FL/HC:      21.7   %    20.1 -
 HC:      322.6  mm     G. Age:  36w 3d         30  %    HC/AC:      0.94        0.93 -
 AC:      344.5  mm     G. Age:  38w 2d         98  %    FL/BPD:     80.5   %    71 - 87
 FL:       70.1  mm     G. Age:  36w 0d         44  %    FL/AC:      20.3   %    20 - 24

 Est. FW:    [5S]  gm    6 lb 15 oz      82  %
OB History

 Gravidity:    7         Term:   3        Prem:   1        SAB:   2
 TOP:          0       Ectopic:  0        Living: 3
Gestational Age

 U/S Today:     36w 3d                                        EDD:   [DATE]
 Best:          36w 0d     Det. By:  Early Ultrasound         EDD:   [DATE]
                                     ([DATE])
Anatomy

 Stomach:               Appears normal, left   Bladder:                Appears normal
                        sided
 Kidneys:               Appear normal
Comments

 HART was seen for a follow-up growth scan and
 BPP due to a history of a prior IUFD at 37 weeks.  She
 denies any problems since her last exam.
 The overall EFW was 6 pounds 15 ounces (82nd percentile).
 The total AFI was 16.5 cm.
 The fetus is in the vertex presentation.
 A BPP performed today was [DATE] with a reactive NST.
 Fetal tachycardia with a baseline heart rate in the 170s to
 180s was noted during her ultrasound exam and NST.  The
 patient stated that fetal tachycardia was noted prior to the
 IUFD when she delivered at [REDACTED].
 Due to her history of a prior IUFD and the fetal tachycardia
 noted today, delivery is recommended at her current
 gestational age.
 She will not require a course of antenatal corticosteroids prior
 to delivery.
 The patient was sent to labor and delivery immediately
 following today's exam for induction.
 The patient is happy and comfortable with the plan for
 delivery today.
 The patient stated that all of her questions were answered.
 A total of 20 minutes was spent counseling and coordinating
 the care for this patient.  Greater than 50% of the time was
 spent in direct face-to-face contact.

## 2022-01-23 MED ORDER — TERBUTALINE SULFATE 1 MG/ML IJ SOLN: 0.2500 mg | Freq: Once | INTRAMUSCULAR | Status: DC | PRN

## 2022-01-23 MED ORDER — OXYCODONE-ACETAMINOPHEN 5-325 MG PO TABS: 1.0000 | ORAL_TABLET | ORAL | Status: DC | PRN

## 2022-01-23 MED ORDER — VANCOMYCIN HCL IN DEXTROSE 1-5 GM/200ML-% IV SOLN: 1000.0000 mg | Freq: Two times a day (BID) | INTRAVENOUS | Status: DC

## 2022-01-23 MED ORDER — EPHEDRINE 5 MG/ML INJ: 10.0000 mg | INTRAVENOUS | Status: DC | PRN

## 2022-01-23 MED ORDER — FENTANYL CITRATE (PF) 100 MCG/2ML IJ SOLN
50.0000 ug | INTRAMUSCULAR | Status: DC | PRN
  Administered 2022-01-23: 100 ug via INTRAVENOUS
  Administered 2022-01-23: 50 ug via INTRAVENOUS
  Filled 2022-01-23 (×2): qty 2

## 2022-01-23 MED ORDER — CEFAZOLIN SODIUM-DEXTROSE 2-4 GM/100ML-% IV SOLN
2.0000 g | Freq: Once | INTRAVENOUS | Status: AC
  Administered 2022-01-23: 2 g via INTRAVENOUS
  Filled 2022-01-23: qty 100

## 2022-01-23 MED ORDER — ONDANSETRON HCL 4 MG/2ML IJ SOLN
4.0000 mg | Freq: Four times a day (QID) | INTRAMUSCULAR | Status: DC | PRN
  Administered 2022-01-24: 4 mg via INTRAVENOUS
  Filled 2022-01-23: qty 2

## 2022-01-23 MED ORDER — LACTATED RINGERS IV SOLN: 500.0000 mL | Freq: Once | INTRAVENOUS | Status: DC

## 2022-01-23 MED ORDER — SOD CITRATE-CITRIC ACID 500-334 MG/5ML PO SOLN: 30.0000 mL | ORAL | Status: DC | PRN

## 2022-01-23 MED ORDER — LEVETIRACETAM 750 MG PO TABS
1500.0000 mg | ORAL_TABLET | Freq: Two times a day (BID) | ORAL | Status: DC
  Administered 2022-01-23 (×2): 750 mg via ORAL
  Administered 2022-01-24: 1500 mg via ORAL
  Filled 2022-01-23: qty 1
  Filled 2022-01-23 (×3): qty 2

## 2022-01-23 MED ORDER — OXYTOCIN-SODIUM CHLORIDE 30-0.9 UT/500ML-% IV SOLN
2.5000 [IU]/h | INTRAVENOUS | Status: DC
  Administered 2022-01-24: 2.5 [IU]/h via INTRAVENOUS
  Filled 2022-01-23 (×2): qty 500

## 2022-01-23 MED ORDER — CEFAZOLIN SODIUM-DEXTROSE 1-4 GM/50ML-% IV SOLN
1.0000 g | Freq: Three times a day (TID) | INTRAVENOUS | Status: DC
  Administered 2022-01-24 (×2): 1 g via INTRAVENOUS
  Filled 2022-01-23 (×5): qty 50

## 2022-01-23 MED ORDER — LAMOTRIGINE 25 MG PO TABS
25.0000 mg | ORAL_TABLET | Freq: Every day | ORAL | Status: DC
  Administered 2022-01-23: 25 mg via ORAL
  Filled 2022-01-23 (×3): qty 1

## 2022-01-23 MED ORDER — OXYTOCIN BOLUS FROM INFUSION
333.0000 mL | Freq: Once | INTRAVENOUS | Status: AC
  Administered 2022-01-24: 333 mL via INTRAVENOUS

## 2022-01-23 MED ORDER — MISOPROSTOL 50MCG HALF TABLET
50.0000 ug | ORAL_TABLET | ORAL | Status: DC
  Administered 2022-01-23: 50 ug via BUCCAL
  Filled 2022-01-23: qty 1

## 2022-01-23 MED ORDER — PHENYLEPHRINE 40 MCG/ML (10ML) SYRINGE FOR IV PUSH (FOR BLOOD PRESSURE SUPPORT): 80.0000 ug | PREFILLED_SYRINGE | INTRAVENOUS | Status: DC | PRN

## 2022-01-23 MED ORDER — LACTATED RINGERS IV SOLN
500.0000 mL | INTRAVENOUS | Status: DC | PRN
  Administered 2022-01-23: 500 mL via INTRAVENOUS

## 2022-01-23 MED ORDER — ACETAMINOPHEN 325 MG PO TABS
650.0000 mg | ORAL_TABLET | ORAL | Status: DC | PRN
  Administered 2022-01-23: 650 mg via ORAL
  Filled 2022-01-23: qty 2

## 2022-01-23 MED ORDER — DIPHENHYDRAMINE HCL 50 MG/ML IJ SOLN
12.5000 mg | INTRAMUSCULAR | Status: DC | PRN
  Administered 2022-01-24 (×2): 12.5 mg via INTRAVENOUS
  Filled 2022-01-23: qty 1

## 2022-01-23 MED ORDER — LACTATED RINGERS IV SOLN: INTRAVENOUS | Status: DC

## 2022-01-23 MED ORDER — OXYCODONE-ACETAMINOPHEN 5-325 MG PO TABS: 2.0000 | ORAL_TABLET | ORAL | Status: DC | PRN

## 2022-01-23 MED ORDER — FENTANYL-BUPIVACAINE-NACL 0.5-0.125-0.9 MG/250ML-% EP SOLN
12.0000 mL/h | EPIDURAL | Status: DC | PRN
  Administered 2022-01-23: 12 mL/h via EPIDURAL
  Filled 2022-01-23: qty 250

## 2022-01-23 MED ORDER — LIDOCAINE HCL (PF) 1 % IJ SOLN: 30.0000 mL | INTRAMUSCULAR | Status: DC | PRN

## 2022-01-23 MED ORDER — LIDOCAINE HCL (PF) 1 % IJ SOLN
INTRAMUSCULAR | Status: DC | PRN
  Administered 2022-01-23: 8 mL via EPIDURAL

## 2022-01-23 MED ORDER — OXYTOCIN-SODIUM CHLORIDE 30-0.9 UT/500ML-% IV SOLN
1.0000 m[IU]/min | INTRAVENOUS | Status: DC
  Administered 2022-01-23: 2 m[IU]/min via INTRAVENOUS

## 2022-01-23 NOTE — Telephone Encounter (Signed)
Preadmission screen  

## 2022-01-23 NOTE — H&P (Addendum)
OBSTETRIC ADMISSION HISTORY AND PHYSICAL  Erin Krueger [name] is a 26 y.o. female [phone] with IUP at [redacted]w[redacted]d by 9-wk [place] presenting from MFM due to prolonged fetal tachycardia. At her office visit with MFM today, noted to have baseline fetal heart rate in the 170s-180s during [place] and NST. Patient has a history of IUFD in 2014 at 39 wks (unclear, this is what pt reported on admit, but also have documented at 37 or 40 w within her chart) and reported similar presentation with fetal tachycardia prior to delivery. After discussion with MFM, shared decision made to induce today given history. She reports +FMs, no LOF, no VB, no blurry vision, headaches, peripheral edema, or RUQ pain.  She plans on breast feeding. She requests a post-placental Liletta for birth control.  She [facility]    Dating: By 9-wk [place] --->  Estimated Date of Delivery: 02/20/22  Sono:   @[redacted]w[redacted]d , CWD, normal anatomy, cephalic presentation, posterior placental lie, 3143 g, 82% EFW   Prenatal History/Complications:  -gHTN -H/o preeclampsia/eclampsia (unclear if eclampsia vs related to underlying seizure disorder)   -Asthma -Seizure disorder -Bipolar I -H/o IUFD  Past Medical History: Past Medical History:  Diagnosis Date   ADD (attention deficit disorder)    ADHD (attention deficit hyperactivity disorder)    Adopted person 10/04/2019   Does not know personal family history other than Diabetes, Breast Cancer. Raised in foster care   Allergy    Anxiety    Asthma    Eating disorder    Headache(784.0)    History of eclampsia    History of pre-eclampsia    Kidney stone    kidney stones   Migraines    ODD (oppositional defiant disorder)    PID (acute pelvic inflammatory disease) 01/29/2016   Pregnancy induced hypertension    PTSD (post-traumatic stress disorder)    PTSD (post-traumatic stress disorder)    Seizures (HCC)    diagnosed age 75    Past Surgical History: Past Surgical History:   Procedure Laterality Date   right knee surgery     TONSILLECTOMY AND ADENOIDECTOMY      Obstetrical History: OB History     Gravida  7   Para  4   Term  3   Preterm  1   AB  2   Living  3      SAB  2   IAB      Ectopic      Multiple  0   Live Births  3           Social History Social History   Socioeconomic History   Marital status: Significant Other    Spouse name: Not on file   Number of children: 2   Years of education: Not on file   Highest education level: Not on file  Occupational History   Occupation: disabled has epilepsy   Tobacco Use   Smoking status: Former    Packs/day: 0.50    Types: Cigarettes, E-cigarettes    Quit date: 04/30/2021    Years since quitting: 0.7   Smokeless tobacco: Never   Tobacco comments:    nicotine patch  Vaping Use   Vaping Use: Former   Substances: Flavoring  Substance and Sexual Activity   Alcohol use: Not Currently   Drug use: Not Currently    Types: Marijuana, Cocaine    Comment: Not since 2018   Sexual activity: Not Currently    Birth control/protection: None  Other Topics Concern   Not on file  Social History Narrative   Not on file   Social Determinants of Health   Financial Resource Strain: Low Risk    Difficulty of Paying Living Expenses: Not hard at all  Food Insecurity: No Food Insecurity   Worried About Programme researcher, broadcasting/film/video in the Last Year: Never true   Ran Out of Food in the Last Year: Never true  Transportation Needs: No Transportation Needs   Lack of Transportation (Medical): No   Lack of Transportation (Non-Medical): No  Physical Activity: Sufficiently Active   Days of Exercise per Week: 4 days   Minutes of Exercise per Session: 60 min  Stress: No Stress Concern Present   Feeling of Stress : Not at all  Social Connections: Moderately Integrated   Frequency of Communication with Friends and Family: Three times a week   Frequency of Social Gatherings with Friends and Family: More  than three times a week   Attends Religious Services: 1 to 4 times per year   Active Member of [employer] or Organizations: No   Attends Engineer, structural: Never   Marital Status: Living with partner    Family History: Family History  Problem Relation Age of Onset   Diabetes Mother    Diabetes Sister    Breast cancer Maternal Grandmother    Diabetes Maternal Grandmother    Glaucoma Maternal Grandmother    Other Son        born with hole in heart   Other Daughter        born with hole in heart    Allergies: Allergies  Allergen Reactions   Bee Venom Anaphylaxis   Penicillins Anaphylaxis    * Has tolerated several cephalosporins* Has patient had a PCN reaction causing immediate rash, facial/tongue/throat swelling, SOB or lightheadedness with hypotension: Unknown Has patient had a PCN reaction causing severe rash involving mucus membranes or skin necrosis: Unknown Has patient had a PCN reaction that required hospitalization: Unknown Has patient had a PCN reaction occurring within the last 10 years: Unknown If all of the above answers are "NO", then may proceed with Cephalosporin use.    Ativan [Lorazepam]     Aggressive-agitation   Peach [Prunus Persica] Itching and Swelling   Prednisone Other (See Comments)    Bleeding nose bleeding and internal bleeding   Latex Rash    Medications Prior to Admission  Medication Sig Dispense Refill Last Dose   aspirin 81 MG EC tablet Take 2 tablets (162 mg total) by mouth daily. Swallow whole. 180 tablet 2 01/23/2022 at 0900   butalbital-acetaminophen-caffeine (FIORICET) 50-325-40 MG tablet Take 1 tablet by mouth every 6 (six) hours as needed for headache. 10 tablet 0 Past Week   diphenhydrAMINE (BENADRYL) 25 mg capsule Take 25 mg by mouth every 6 (six) hours as needed.   01/22/2022   lamoTRIgine (LAMICTAL) 25 MG tablet Take 1 tablet (25 mg total) by mouth daily for 7 days. 7 tablet 0 01/22/2022 at 2110   levETIRAcetam (KEPPRA) 750 MG  tablet Take 2 tablets (1,500 mg total) by mouth 2 (two) times daily. 360 tablet 4 01/23/2022 at 0900   magnesium oxide (MAG-OX) 400 MG tablet Take 400 mg by mouth daily.   01/23/2022 at 0900   Prenatal Vit-Fe Fumarate-FA (PRENATAL VITAMIN PO) Take by mouth.   01/23/2022 at 0900     Review of Systems   All systems reviewed and negative except as stated in HPI  Blood pressure  127/84, pulse 98, temperature 98.3 F (36.8 C), temperature source Axillary, resp. rate 18, height 5\' 6"  (1.676 m), weight 100.7 kg, SpO2 97 %, unknown if currently breastfeeding. General appearance: alert, cooperative, and no distress Lungs: normal work of breathing on room air Heart: regular rate, warm and well-perfused Abdomen: soft, non-tender; gravid Extremities: no LE edema, no calf tenderness to palpation  Presentation: cephalic Fetal monitoring: Baseline FHR 150 bpm/moderate variability/+accels, no decels Uterine activity: None Dilation: 1.5 Effacement (%): Thick Station: -3 Exam by:: Dr. [email or web address]   Prenatal labs: ABO, Rh: --/--/O POS (03/03 1730) Antibody: NEG (03/03 1730) Rubella: 6.89 (09/20 1654) RPR: Non Reactive (01/09 0835)  HBsAg: Negative (09/20 1654)  HIV: Non Reactive (01/09 0835)  GBS:  Unknown, collected yesterday 2 hr Glucola: Normal Genetic screening: Integrated 1&2 NIPS negative, AFP negative Anatomy 06-[birth year]1994: Normal  Prenatal Transfer Tool  Maternal Diabetes: No Genetic Screening: Normal Maternal Ultrasounds/Referrals: Normal Fetal Ultrasounds or other Referrals:  None Maternal Substance Abuse:  No Significant Maternal Medications:  Meds include: Other:  Keppra 1500 mg BID, lamictal 25 mg daily Significant Maternal Lab Results: GBS culture pending, h/o GBS positive  Results for orders placed or performed during the hospital encounter of 01/23/22 (from the past 24 hour(s))  Resp Panel by RT-PCR (Flu A&B, Covid) Nasopharyngeal Swab   Collection Time: 01/23/22  5:30 PM   Specimen:  Nasopharyngeal Swab; Nasopharyngeal(NP) swabs in vial transport medium  Result Value Ref Range   SARS Coronavirus 2 by RT PCR NEGATIVE NEGATIVE   Influenza A by PCR NEGATIVE NEGATIVE   Influenza B by PCR NEGATIVE NEGATIVE  CBC   Collection Time: 01/23/22  5:30 PM  Result Value Ref Range   WBC 18.3 (H) 4.0 - 10.5 K/uL   RBC 3.82 (L) 3.87 - 5.11 MIL/uL   Hemoglobin 11.9 (L) 12.0 - 15.0 g/dL   HCT 03/25/22 (L) 01.7 - 79.3 %   MCV 91.4 80.0 - 100.0 fL   MCH 31.2 26.0 - 34.0 pg   MCHC 34.1 30.0 - 36.0 g/dL   RDW 90.3 00.9 - 23.3 %   Platelets 278 150 - 400 K/uL   nRBC 0.0 0.0 - 0.2 %  Comprehensive metabolic panel   Collection Time: 01/23/22  5:30 PM  Result Value Ref Range   Sodium 136 135 - 145 mmol/L   Potassium 3.7 3.5 - 5.1 mmol/L   Chloride 107 98 - 111 mmol/L   CO2 19 (L) 22 - 32 mmol/L   Glucose, Bld 106 (H) 70 - 99 mg/dL   BUN 6 6 - 20 mg/dL   Creatinine, Ser 03/25/22 0.44 - 1.00 mg/dL   Calcium 8.8 (L) 8.9 - 10.3 mg/dL   Total Protein 6.1 (L) 6.5 - 8.1 g/dL   Albumin 2.8 (L) 3.5 - 5.0 g/dL   AST 16 15 - 41 U/L   ALT 12 0 - 44 U/L   Alkaline Phosphatase 92 38 - 126 U/L   Total Bilirubin 0.2 (L) 0.3 - 1.2 mg/dL   GFR, Estimated 6.22 >63 mL/min   Anion gap 10 5 - 15  Protein / creatinine ratio, urine   Collection Time: 01/23/22  5:30 PM  Result Value Ref Range   Creatinine, Urine 237.23 mg/dL   Total Protein, Urine 35 mg/dL   Protein Creatinine Ratio 0.15 0.00 - 0.15 mg/mg[Cre]  Type and screen   Collection Time: 01/23/22  5:30 PM  Result Value Ref Range   ABO/RH(D) O POS    Antibody Screen NEG  Sample Expiration      01/26/2022,2359 Performed at [facility], 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 [id number] at 4260w0d here for IOL due to prolonged fetal tachycardia with h/o IUFD.  #Labor: Will initiate induction with cytotec and FB for cervical ripening. FB placed without complication, tolerated procedure well. Will reassess in 4 hours.  #Pain: PRN, planning for epidural #FWB: Cat 1 #ID: GBS unknown, culture pending. H/o GBS positive. PCN anaphylactic allergy listed in allergies but has tolerated several cephalosporins, including Ancef during 2021 delivery. Will start Ancef. #MOF: Breast #MOC: Post-placental IUD, consented at bedside  #Fetal tachycardia: Given IV fluid bolus with resolution on arrival. Category 1 strip on admission. Will continue to monitor closely.  #gHTN: Mild range BP. Asymptomatic. Negative PEC labs on admission. Will continue to monitor.   #Seizure disorder: Reported diagnosis of epilepsy since age 89. Evaluated in MAU at 27 weeks during current pregnancy with concern for epileptic breakthrough seizures, increased Keppra to 1500 mg BID at that time. Recently prescribed Lamictal 25 mg (per inpatient Neurology recs) with plan to up titrate on 01/13/22 after ED presentation with concern for another breakthrough seizure. Missed follow up Neurology outpatient appt. Did not start taking medication until this past week. Will plan to continue Keppra and Lamictal here.  Anselm PancoastSamantha Horowitz, DO PGY-1 01/23/2022, 7:30 PM  GME ATTESTATION:  I saw and evaluated the patient. I agree with  the findings and the plan of care as documented in the residents note.  Leticia PennaSamantha Demian Maisel, DO OB Fellow, Faculty [facility] [place], [facility] 01/23/2022 8:22 PM

## 2022-01-23 NOTE — Progress Notes (Addendum)
Erin Krueger is a 26 y.o. [phone] at [redacted]w[redacted]d by ultrasound admitted for induction of labor due to fetal tachycardia. ? ?Subjective: ?Erin Krueger is coping well with CTXs using breathing techniques but requests IV pain intervention to assist. She is planning to get an epidural at some point when her labor progresses. She does not report decreased fetal movement but reports vaginal bleeding.  ? ?Objective: ?BP 123/87   Pulse 94   Temp 98.3 ?F (36.8 ?C) (Axillary)   Resp 15   Ht 5\' 6"  (1.676 m)   Wt 100.7 kg   LMP  (LMP Unknown)   SpO2 97%   BMI 35.83 kg/m?  ?No intake/output data recorded. ?No intake/output data recorded. ? ?FHT:  FHR: 150 bpm, variability: moderate,  accelerations:  Present,  decelerations:  Absent ?UC:   regular, every 4-5 minutes ?SVE:   Dilation: 4.5 ?Effacement (%): 60 ?Station: -3 ?Exam by:: [place], SNM ?Moderate vaginal bleeding noted with SVE, dark red with mucous, no clots present ? ?Labs: ?Lab Results  ?Component Value Date  ? WBC 18.3 (H) 01/23/2022  ? HGB 11.9 (L) 01/23/2022  ? HCT 34.9 (L) 01/23/2022  ? MCV 91.4 01/23/2022  ? PLT 278 01/23/2022  ? ? ?Assessment / Plan: ?mIOL at 36w progressing well with cytotec and FB.  ? ?Labor:  progresssing normally. Continue current management.  ?Preeclampsia:  labs stable ?Fetal Wellbeing:  Category I ?Pain Control:  Epidural and IV pain meds ?I/D:  n/a ?Anticipated MOD:  NSVD ? ?Shella Maxim, SNM ?01/23/2022, 8:58 PM ? ? ?

## 2022-01-23 NOTE — Procedures (Signed)
Erin Krueger ?1996-11-20 ?[redacted]w[redacted]d ? ?Fetus A Non-Stress Test Interpretation for 01/23/22 ? ?Indication:  Fetal Tachycardia ? ?Fetal Heart Rate A ?Mode: External ?Baseline Rate (A): 170 bpm ?Variability: Moderate ?Accelerations: 15 x 15 ?Decelerations: None ?Multiple birth?: No ? ?Uterine Activity ?Mode: Palpation, Toco ?Contraction Frequency (min): none ?Resting Tone Palpated: Relaxed ? ?Interpretation (Fetal Testing) ?Nonstress Test Interpretation: Reactive ?Overall Impression: Non-reassuring (Fetal tachycardia during [place] and NST) ?Comments: Dr. Parke Poisson reviewed tracing. Patient sent to [facility] L&D for induction of labor due to prolonged fetal tachycardia. ? ? ?

## 2022-01-23 NOTE — Anesthesia Preprocedure Evaluation (Signed)
Anesthesia Evaluation  ?Patient identified by MRN, date of birth, ID band ?Patient awake ? ? ? ?Reviewed: ?Allergy & Precautions, Patient's Chart, lab work & pertinent test results ? ?Airway ?Mallampati: II ? ?TM Distance: >3 FB ?Neck ROM: Full ? ? ? Dental ?no notable dental hx. ?(+) Teeth Intact ?  ?Pulmonary ?asthma , former smoker,  ?  ?Pulmonary exam normal ?breath sounds clear to auscultation ? ? ? ? ? ? Cardiovascular ?hypertension, Normal cardiovascular exam ?Rhythm:Regular Rate:Normal ? ? ?  ?Neuro/Psych ? Headaches, Seizures - (last seizure 2 days ago), Poorly Controlled,  PSYCHIATRIC DISORDERS Anxiety Bipolar Disorder Hx/o Oppositional [place] disorder  ? GI/Hepatic ?GERD  ,(+)  ?  ? substance abuse ? , Hx/o Polysubstance abuse ?  ?Endo/Other  ?Obesity ? Renal/GU ?Renal diseaseHx/o renal calculi  ?negative genitourinary ?  ?Musculoskeletal ?negative musculoskeletal ROS ?(+)  ? Abdominal ?  ?Peds ? Hematology ? ?(+) Blood dyscrasia, anemia ,   ?Anesthesia Other Findings ? ? Reproductive/Obstetrics ?(+) Pregnancy ? ?  ? ? ? ? ? ? ? ? ? ? ? ? ? ?  ?  ? ? ? ? ? ? ? ? ?Anesthesia Physical ?Anesthesia Plan ? ?ASA: 2 ? ?Anesthesia Plan: Epidural  ? ?Post-op Pain Management:   ? ?Induction:  ? ?PONV Risk Score and Plan: 2 and Treatment may vary due to age or medical condition ? ?Airway Management Planned: Natural Airway ? ?Additional Equipment:  ? ?Intra-op Plan:  ? ?Post-operative Plan:  ? ?Informed Consent: I have reviewed the patients History and Physical, chart, labs and discussed the procedure including the risks, benefits and alternatives for the proposed anesthesia with the patient or authorized representative who has indicated his/her understanding and acceptance.  ? ? ? ? ? ?Plan Discussed with: Anesthesiologist ? ?Anesthesia Plan Comments:   ? ? ? ? ? ? ?Anesthesia Quick Evaluation ? ?

## 2022-01-23 NOTE — Progress Notes (Signed)
LIANN RACCA is a 26 y.o. [phone] at [redacted]w[redacted]d by ultrasound admitted for induction of labor due to prolonged fetal tachycardia. ? ?Subjective: ?Shuntia reports relief with epidural on board. Denies discomfort. Does not report decreased fetal movement, LOF. Reports mild HA at this time ? ?Objective: ?BP 119/79   Pulse 90   Temp 98.1 ?F (36.7 ?C) (Oral)   Resp 15   Ht 5\' 6"  (1.676 m)   Wt 100.7 kg   LMP  (LMP Unknown)   SpO2 98%   BMI 35.83 kg/m?  ?No intake/output data recorded. ?No intake/output data recorded. ? ?FHT:  FHR: 135 bpm, variability: moderate,  accelerations:  Present,  decelerations:  Absent ?UC:   regular, every 2-3 minutes ?SVE:   Dilation: 4.5 ?Effacement (%): 60 ?Station: -3 ?Exam by:: Greggory Keen, SNM ? ?Labs: ?Lab Results  ?Component Value Date  ? WBC 18.3 (H) 01/23/2022  ? HGB 11.9 (L) 01/23/2022  ? HCT 34.9 (L) 01/23/2022  ? MCV 91.4 01/23/2022  ? PLT 278 01/23/2022  ? ? ?Assessment / Plan: ?mIOL progressing normally at 36w.  ?Increased vaginal bleeding following cervical manipulation. Plan to watch pad counts for abnormal saturation. Dr. Ilda Basset aware. Will initiate pitocin to encourage adequate active labor pattern and treat HA with acetaminophen. ? ?Labor:  progressing normally, pitocin ordered now ?Preeclampsia:  labs stable ?Fetal Wellbeing:  Category I ?Pain Control:  Epidural ?I/D:  n/a ?Anticipated MOD:  NSVD ? ? ?Shella Maxim, SNM ?01/23/2022, 11:25 PM ? ? ?

## 2022-01-23 NOTE — H&P (Shared)
OBSTETRIC ADMISSION HISTORY AND PHYSICAL  Erin Krueger Erin Krueger is a 26 y.o. female [phone] with IUP at [redacted]w[redacted]d by 9 wk [place] presenting for ***. She reports +FMs, No LOF, no VB, no blurry vision, headaches or peripheral edema, and RUQ pain.  She plans on *** feeding. She request *** for birth control. She received her prenatal care at {Blank single:19197::"[facility]"}   Dating: By *** --->  Estimated Date of Delivery: 02/20/22  Sono:    @***w***d, CWD, normal anatomy, *** presentation, *** lie, ***g, ***% EFW   Prenatal History/Complications: ***  Past Medical History: Past Medical History:  Diagnosis Date   ADD (attention deficit disorder)    ADHD (attention deficit hyperactivity disorder)    Adopted person 10/04/2019   Does not know personal family history other than Diabetes, Breast Cancer. Raised in foster care   Allergy    Anxiety    Asthma    Eating disorder    Headache(784.0)    History of eclampsia    History of pre-eclampsia    Kidney stone    kidney stones   Migraines    ODD (oppositional defiant disorder)    PID (acute pelvic inflammatory disease) 01/29/2016   Pregnancy induced hypertension    PTSD (post-traumatic stress disorder)    PTSD (post-traumatic stress disorder)    Seizures (HCC)    diagnosed age 41    Past Surgical History: Past Surgical History:  Procedure Laterality Date   right knee surgery     TONSILLECTOMY AND ADENOIDECTOMY      Obstetrical History: OB History     Gravida  7   Para  4   Term  3   Preterm  1   AB  2   Living  3      SAB  2   IAB      Ectopic      Multiple  0   Live Births  3           Social History Social History   Socioeconomic History   Marital status: Significant Other    Spouse name: Not on file   Number of children: 2   Years of education: Not on file   Highest education level: Not on file  Occupational History   Occupation: disabled has epilepsy   Tobacco Use    Smoking status: Former    Packs/day: 0.50    Types: Cigarettes, E-cigarettes    Quit date: 04/30/2021    Years since quitting: 0.7   Smokeless tobacco: Never   Tobacco comments:    nicotine patch  Vaping Use   Vaping Use: Former   Substances: Flavoring  Substance and Sexual Activity   Alcohol use: Not Currently   Drug use: Not Currently    Types: Marijuana, Cocaine    Comment: Not since 2018   Sexual activity: Not Currently    Birth control/protection: None  Other Topics Concern   Not on file  Social History Narrative   Not on file   Social Determinants of Health   Financial Resource Strain: Low Risk    Difficulty of Paying Living Expenses: Not hard at all  Food Insecurity: No Food Insecurity   Worried About Programme researcher, broadcasting/film/video in the Last Year: Never true   Ran Out of Food in the Last Year: Never true  Transportation Needs: No Transportation Needs   Lack of Transportation (Medical): No   Lack of Transportation (Non-Medical): No  Physical Activity: Sufficiently Active   Days  of Exercise per Week: 4 days   Minutes of Exercise per Session: 60 min  Stress: No Stress Concern Present   Feeling of Stress : Not at all  Social Connections: Moderately Integrated   Frequency of Communication with Friends and Family: Three times a week   Frequency of Social Gatherings with Friends and Family: More than three times a week   Attends Religious Services: 1 to 4 times per year   Active Member of [employer] or Organizations: No   Attends Engineer, structural: Never   Marital Status: Living with partner    Family History: Family History  Problem Relation Age of Onset   Diabetes Mother    Diabetes Sister    Breast cancer Maternal Grandmother    Diabetes Maternal Grandmother    Glaucoma Maternal Grandmother    Other Son        born with hole in heart   Other Daughter        born with hole in heart    Allergies: Allergies  Allergen Reactions   Bee Venom Anaphylaxis    Penicillins Anaphylaxis    * Has tolerated several cephalosporins* Has patient had a PCN reaction causing immediate rash, facial/tongue/throat swelling, SOB or lightheadedness with hypotension: Unknown Has patient had a PCN reaction causing severe rash involving mucus membranes or skin necrosis: Unknown Has patient had a PCN reaction that required hospitalization: Unknown Has patient had a PCN reaction occurring within the last 10 years: Unknown If all of the above answers are "NO", then may proceed with Cephalosporin use.    Ativan [Lorazepam]     Aggressive-agitation   Peach [Prunus Persica] Itching and Swelling   Prednisone Other (See Comments)    Bleeding nose bleeding and internal bleeding   Latex Rash    Medications Prior to Admission  Medication Sig Dispense Refill Last Dose   aspirin 81 MG EC tablet Take 2 tablets (162 mg total) by mouth daily. Swallow whole. 180 tablet 2    butalbital-acetaminophen-caffeine (FIORICET) 50-325-40 MG tablet Take 1 tablet by mouth every 6 (six) hours as needed for headache. 10 tablet 0    diphenhydrAMINE (BENADRYL) 25 mg capsule Take 25 mg by mouth every 6 (six) hours as needed.      lamoTRIgine (LAMICTAL) 25 MG tablet Take 1 tablet (25 mg total) by mouth daily for 7 days. 7 tablet 0    levETIRAcetam (KEPPRA) 750 MG tablet Take 2 tablets (1,500 mg total) by mouth 2 (two) times daily. 360 tablet 4    magnesium oxide (MAG-OX) 400 MG tablet Take 400 mg by mouth daily.      Prenatal Vit-Fe Fumarate-FA (PRENATAL VITAMIN PO) Take by mouth.        Review of Systems   All systems reviewed and negative except as stated in HPI  Blood pressure 126/88, pulse (!) 107, temperature 98.3 F (36.8 C), temperature source Axillary, resp. rate 18, SpO2 97 %, unknown if currently breastfeeding. General appearance: {general exam:16600} Lungs: clear to auscultation bilaterally Heart: regular rate and rhythm Abdomen: soft, non-tender; bowel sounds normal Pelvic:  *** Extremities: Homans sign is negative, no sign of DVT DTR's *** Presentation: {desc; fetal presentation:14558} Fetal monitoring{findings; monitor fetal heart monitor:31527} Uterine activity{Uterine contractions:31516}     Prenatal labs: ABO, Rh: --/--/PENDING (03/03 1730) Antibody: PENDING (03/03 1730) Rubella: 6.89 (09/20 1654) RPR: Non Reactive (01/09 0835)  HBsAg: Negative (09/20 1654)  HIV: Non Reactive (01/09 0835)  GBS:    1 hr Glucola *** Genetic  screening  *** Anatomy US ***  Prenatal Transfer Tool  Maternal Diabetes: {Maternal Diabetes:3043596} Genetic Screening: {Genetic Screening:20205} Maternal Ultrasounds/Referrals: {Maternal Ultrasounds / Referrals:20211} Fetal Ultrasounds or other Referrals:  {Fetal Ultrasounds or Other Referrals:20213} Maternal Substance Abuse:  {Maternal Substance Abuse:20223} Significant Maternal Medications:  {Significant Maternal Meds:20233} Significant Maternal Lab Results: {Significant Maternal Lab Results:20235}  Results for orders placed or performed during the hospital encounter of 01/23/22 (from the past 24 hour(s))  Type and screen   Collection Time: 01/23/22  5:30 PM  Result Value Ref Range   ABO/RH(D) PENDING    Antibody Screen PENDING    Sample Expiration      01/26/2022,2359 Performed at [facility], 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 [id number] at [redacted]w[redacted]d here for***  #Labor:*** #Pain: *** #FWB: *** #ID:  *** #MOF: *** #MOC:*** #Circ:  Allayne Stack, DO  01/23/2022, 5:50 PM

## 2022-01-23 NOTE — Progress Notes (Signed)
MFM Note ? ?Erin Krueger was seen for a follow-up growth scan and BPP due to a history of a prior IUFD at 37 weeks.  She denies any problems since her last exam. ? ?The overall EFW was 6 pounds 15 ounces (82nd percentile).  The total AFI was 16.5 cm. ? ?The fetus is in the vertex presentation. ? ?A BPP performed today was 10 out of 10 with a reactive NST. ? ?Fetal tachycardia with a baseline heart rate in the 170s to 180s was noted during her ultrasound exam and NST.  The patient stated that fetal tachycardia was noted prior to the IUFD when she delivered at [facility]. ? ?Due to her history of a prior IUFD and the fetal tachycardia noted today, delivery is recommended at her current gestational age.   ? ?She will not require a course of antenatal corticosteroids prior to delivery. ? ?The patient was sent to labor and delivery immediately following today's exam for induction. ? ?The patient is happy and comfortable with the plan for delivery today.   ? ?The patient stated that all of her questions were answered. ? ?A total of 20 minutes was spent counseling and coordinating the care for this patient.  Greater than 50% of the time was spent in direct face-to-face contact. ?

## 2022-01-23 NOTE — Anesthesia Procedure Notes (Signed)
Epidural ?Patient location during procedure: OB ?Start time: 01/23/2022 10:26 PM ?End time: 01/23/2022 10:36 PM ? ?Staffing ?Anesthesiologist: Merlinda Frederick, MD ?Performed: anesthesiologist  ? ?Preanesthetic Checklist ?Completed: patient identified, IV checked, site marked, risks and benefits discussed, monitors and equipment checked, pre-op evaluation and timeout performed ? ?Epidural ?Patient position: sitting ?Prep: DuraPrep ?Patient monitoring: heart rate, cardiac monitor, continuous pulse ox and blood pressure ?Approach: midline ?Location: L2-L3 ?Injection technique: LOR saline ? ?Needle:  ?Needle type: Tuohy  ?Needle gauge: 17 G ?Needle length: 9 cm ?Needle insertion depth: 5 cm ?Catheter type: closed end flexible ?Catheter size: 20 Guage ?Catheter at skin depth: 10 cm ?Test dose: negative and Other ? ?Assessment ?Events: blood not aspirated, injection not painful, no injection resistance and negative IV test ? ?Additional Notes ?Informed consent obtained prior to proceeding including risk of failure, 1% risk of PDPH, risk of minor discomfort and bruising.  Discussed rare but serious complications including epidural abscess, permanent nerve injury, epidural hematoma.  Discussed alternatives to epidural analgesia and patient desires to proceed.  Timeout performed pre-procedure verifying patient name, procedure, and platelet count.  Patient tolerated procedure well. ? ? ? ? ?

## 2022-01-24 ENCOUNTER — Encounter (HOSPITAL_COMMUNITY): Payer: Self-pay | Admitting: Obstetrics and Gynecology

## 2022-01-24 DIAGNOSIS — Z3A36 36 weeks gestation of pregnancy: Secondary | ICD-10-CM

## 2022-01-24 DIAGNOSIS — O09293 Supervision of pregnancy with other poor reproductive or obstetric history, third trimester: Secondary | ICD-10-CM

## 2022-01-24 DIAGNOSIS — Z30014 Encounter for initial prescription of intrauterine contraceptive device: Secondary | ICD-10-CM

## 2022-01-24 DIAGNOSIS — O134 Gestational [pregnancy-induced] hypertension without significant proteinuria, complicating childbirth: Secondary | ICD-10-CM

## 2022-01-24 LAB — CBC
HCT: 31.6 % — ABNORMAL LOW (ref 36.0–46.0)
Hemoglobin: 10.5 g/dL — ABNORMAL LOW (ref 12.0–15.0)
MCH: 30.3 pg (ref 26.0–34.0)
MCHC: 33.2 g/dL (ref 30.0–36.0)
MCV: 91.3 fL (ref 80.0–100.0)
Platelets: 255 10*3/uL (ref 150–400)
RBC: 3.46 MIL/uL — ABNORMAL LOW (ref 3.87–5.11)
RDW: 12.7 % (ref 11.5–15.5)
WBC: 20.3 10*3/uL — ABNORMAL HIGH (ref 4.0–10.5)
nRBC: 0 % (ref 0.0–0.2)

## 2022-01-24 LAB — COMPREHENSIVE METABOLIC PANEL
ALT: 12 U/L (ref 0–44)
AST: 13 U/L — ABNORMAL LOW (ref 15–41)
Albumin: 2.5 g/dL — ABNORMAL LOW (ref 3.5–5.0)
Alkaline Phosphatase: 75 U/L (ref 38–126)
Anion gap: 9 (ref 5–15)
BUN: 5 mg/dL — ABNORMAL LOW (ref 6–20)
CO2: 21 mmol/L — ABNORMAL LOW (ref 22–32)
Calcium: 8.5 mg/dL — ABNORMAL LOW (ref 8.9–10.3)
Chloride: 105 mmol/L (ref 98–111)
Creatinine, Ser: 0.55 mg/dL (ref 0.44–1.00)
GFR, Estimated: >60 mL/min (ref >60)
Glucose, Bld: 101 mg/dL — ABNORMAL HIGH (ref 70–99)
Potassium: 3.5 mmol/L (ref 3.5–5.1)
Sodium: 135 mmol/L (ref 135–145)
Total Bilirubin: 0.3 mg/dL (ref 0.3–1.2)
Total Protein: 5.4 g/dL — ABNORMAL LOW (ref 6.5–8.1)

## 2022-01-24 LAB — RPR: RPR Ser Ql: NONREACTIVE

## 2022-01-24 MED ORDER — MAGNESIUM OXIDE -MG SUPPLEMENT 400 (240 MG) MG PO TABS
400.0000 mg | ORAL_TABLET | Freq: Every day | ORAL | Status: DC
  Administered 2022-01-25 – 2022-01-26 (×2): 400 mg via ORAL
  Filled 2022-01-24 (×3): qty 1

## 2022-01-24 MED ORDER — IBUPROFEN 600 MG PO TABS
600.0000 mg | ORAL_TABLET | Freq: Four times a day (QID) | ORAL | Status: DC
  Administered 2022-01-24 – 2022-01-26 (×6): 600 mg via ORAL
  Filled 2022-01-24 (×7): qty 1

## 2022-01-24 MED ORDER — BUTALBITAL-APAP-CAFFEINE 50-325-40 MG PO TABS: 1.0000 | ORAL_TABLET | Freq: Four times a day (QID) | ORAL | Status: DC | PRN

## 2022-01-24 MED ORDER — TETANUS-DIPHTH-ACELL PERTUSSIS 5-2.5-18.5 LF-MCG/0.5 IM SUSY: 0.5000 mL | PREFILLED_SYRINGE | Freq: Once | INTRAMUSCULAR | Status: DC

## 2022-01-24 MED ORDER — ACETAMINOPHEN 325 MG PO TABS: 650.0000 mg | ORAL_TABLET | ORAL | Status: DC | PRN

## 2022-01-24 MED ORDER — BENZOCAINE-MENTHOL 20-0.5 % EX AERO: 1.0000 "application " | INHALATION_SPRAY | CUTANEOUS | Status: DC | PRN

## 2022-01-24 MED ORDER — WITCH HAZEL-GLYCERIN EX PADS: 1.0000 "application " | MEDICATED_PAD | CUTANEOUS | Status: DC | PRN

## 2022-01-24 MED ORDER — LAMOTRIGINE 25 MG PO TABS
25.0000 mg | ORAL_TABLET | Freq: Every day | ORAL | Status: DC
  Administered 2022-01-24 – 2022-01-25 (×2): 25 mg via ORAL
  Filled 2022-01-24 (×3): qty 1

## 2022-01-24 MED ORDER — PRENATAL MULTIVITAMIN CH
1.0000 | ORAL_TABLET | Freq: Every day | ORAL | Status: DC
  Administered 2022-01-25 – 2022-01-26 (×2): 1 via ORAL
  Filled 2022-01-24 (×2): qty 1

## 2022-01-24 MED ORDER — DIPHENHYDRAMINE HCL 25 MG PO CAPS: 25.0000 mg | ORAL_CAPSULE | Freq: Four times a day (QID) | ORAL | Status: DC | PRN

## 2022-01-24 MED ORDER — BUTALBITAL-APAP-CAFFEINE 50-325-40 MG PO TABS
1.0000 | ORAL_TABLET | Freq: Once | ORAL | Status: AC
  Administered 2022-01-24: 1 via ORAL
  Filled 2022-01-24: qty 1

## 2022-01-24 MED ORDER — SIMETHICONE 80 MG PO CHEW: 80.0000 mg | CHEWABLE_TABLET | ORAL | Status: DC | PRN

## 2022-01-24 MED ORDER — LEVETIRACETAM 750 MG PO TABS
1500.0000 mg | ORAL_TABLET | Freq: Two times a day (BID) | ORAL | Status: DC
Start: 2022-01-24 — End: 2022-01-26
  Administered 2022-01-24 – 2022-01-26 (×4): 1500 mg via ORAL
  Filled 2022-01-24 (×5): qty 2

## 2022-01-24 MED ORDER — LEVONORGESTREL 20.1 MCG/DAY IU IUD
1.0000 | INTRAUTERINE_SYSTEM | Freq: Once | INTRAUTERINE | Status: AC
  Administered 2022-01-24: 1 via INTRAUTERINE
  Filled 2022-01-24: qty 1

## 2022-01-24 MED ORDER — SENNOSIDES-DOCUSATE SODIUM 8.6-50 MG PO TABS
2.0000 | ORAL_TABLET | Freq: Every day | ORAL | Status: DC
  Administered 2022-01-25 – 2022-01-26 (×2): 2 via ORAL
  Filled 2022-01-24 (×2): qty 2

## 2022-01-24 MED ORDER — DIBUCAINE (PERIANAL) 1 % EX OINT: 1.0000 "application " | TOPICAL_OINTMENT | CUTANEOUS | Status: DC | PRN

## 2022-01-24 MED ORDER — ZOLPIDEM TARTRATE 5 MG PO TABS: 5.0000 mg | ORAL_TABLET | Freq: Every evening | ORAL | Status: DC | PRN

## 2022-01-24 MED ORDER — ONDANSETRON HCL 4 MG/2ML IJ SOLN: 4.0000 mg | INTRAMUSCULAR | Status: DC | PRN

## 2022-01-24 MED ORDER — ONDANSETRON HCL 4 MG PO TABS: 4.0000 mg | ORAL_TABLET | ORAL | Status: DC | PRN

## 2022-01-24 MED ORDER — COCONUT OIL OIL
1.0000 "application " | TOPICAL_OIL | Status: DC | PRN
  Administered 2022-01-25: 1 via TOPICAL

## 2022-01-24 NOTE — Discharge Summary (Addendum)
?  Postpartum Discharge Summary ? ?   ?Patient Name: Erin Krueger ?DOB: August 01, 1996 ?MRN: [id number] ? ?Date of admission: 01/23/2022 ?Delivery date:01/24/2022  ?Delivering provider: Marcille Buffy D  ?Date of discharge: 01/26/2022 ? ?Admitting diagnosis: Gestational hypertension [O13.9] ?Intrauterine pregnancy: [redacted]w[redacted]d    ?Secondary diagnosis:  Principal Problem: ?  Vaginal delivery ?Active Problems: ?  H/o child with congenital heart defect ?  Asthma ?  Bipolar 1 disorder ([place] ?  Hx of preeclampsia, prior pregnancy, currently pregnant ?  Supervision of high risk pregnancy, antepartum ?  Gestational hypertension ?  Seizure disorder during pregnancy in third trimester ([facility], potentially pseudoseizures/psychogenic seizures ? ?Additional problems: None    ?Discharge diagnosis: Preterm Pregnancy Delivered                                              ?Post partum procedures: PP IUD placement  ?Augmentation: AROM ?Complications: None ? ?Hospital course: Induction of Labor With Vaginal Delivery   ?26y.o. yo [id number] admitted to the hospital 01/23/2022 for induction of labor.  Indication for induction:  non-reassuring fetal testing .  Patient had an uncomplicated labor course as follows: ?Membrane Rupture Time/Date: 1:35 AM ,01/24/2022   ?Delivery Method:Vaginal, Spontaneous  ?Episiotomy: None  ?Lacerations:  None  ?Details of delivery can be found in separate delivery note.  Patient had a routine postpartum course. She is ambulating, tolerating a regular diet, passing flatus, and urinating well. Patient was started on Lasix 2028miven her history of gHTN. Her blood pressures did not meet criteria for starting PO Procardia. She was set up with 1 week BP check and had a BP cuff at her home. Attempt was made to sign her up for baby scripts however her insurance did not allow for setting up. Patient's seizure medications were kept the same at discharge and patient will call neurologist to reschedule the appointment she  missed while being in hospital. A message was also sent by the L&D team to the neurologist to schedule. Patient is discharged home 01/26/22. ? ?Newborn Data: ?Birth date:01/24/2022  ?Birth time:4:27 PM  ?Gender:Female  ?Living status:Living  ?Apgars:9 ,9  ?Weight:6 lb 7.4 oz (2.93 kg)  ? ?Magnesium Sulfate received: No ?BMZ received: No ?Rhophylac:N/A ?MMR:N/A ?T-DaP:Given prenatally ?Flu: Given prenatally ?Transfusion:No ? ?Physical exam  ?Vitals:  ? 01/25/22 1557 01/25/22 2158 01/25/22 2332 01/26/22 0528  ?BP: 118/81 (!) 134/92 117/70 111/86  ?Pulse: 88 95 88 72  ?Resp: 20   18  ?Temp: 98.2 ?F (36.8 ?C) 98.2 ?F (36.8 ?C)  98 ?F (36.7 ?C)  ?TempSrc:  Oral  Oral  ?SpO2:  100%    ?Weight:      ?Height:      ? ?General: alert, cooperative, and no distress ?Lochia: appropriate ?Uterine Fundus: firm and below umbilicus ?DVT Evaluation: no LE edema, no calf tenderness to palpation ? ?Labs: ?Lab Results  ?Component Value Date  ? WBC 18.7 (H) 01/25/2022  ? HGB 9.8 (L) 01/25/2022  ? HCT 29.1 (L) 01/25/2022  ? MCV 90.9 01/25/2022  ? PLT 229 01/25/2022  ? ?CMP Latest Ref Rng & Units 01/24/2022  ?Glucose 70 - 99 mg/dL 101(H)  ?BUN 6 - 20 mg/dL 5(L)  ?Creatinine 0.44 - 1.00 mg/dL 0.55  ?Sodium 135 - 145 mmol/L 135  ?Potassium 3.5 - 5.1 mmol/L 3.5  ?Chloride 98 - 111 mmol/L  105  ?CO2 22 - 32 mmol/L 21(L)  ?Calcium 8.9 - 10.3 mg/dL 8.5(L)  ?Total Protein 6.5 - 8.1 g/dL 5.4(L)  ?Total Bilirubin 0.3 - 1.2 mg/dL 0.3  ?Alkaline Phos 38 - 126 U/L 75  ?AST 15 - 41 U/L 13(L)  ?ALT 0 - 44 U/L 12  ? ?Edinburgh Score: ?Edinburgh Postnatal Depression Scale Screening Tool 01/24/2022  ?I have been able to laugh and see the funny side of things. 0  ?I have looked forward with enjoyment to things. 0  ?I have blamed myself unnecessarily when things went wrong. 0  ?I have been anxious or worried for no good reason. 0  ?I have felt scared or panicky for no good reason. 0  ?Things have been getting on top of me. 0  ?I have been so unhappy that I have had  difficulty sleeping. 0  ?I have felt sad or miserable. 0  ?I have been so unhappy that I have been crying. 0  ?The thought of harming myself has occurred to me. 0  ?Edinburgh Postnatal Depression Scale Total 0  ? ? ? ? ?After visit meds:  ?Allergies as of 01/26/2022   ? ?   Reactions  ? Bee Venom Anaphylaxis  ? Peach [prunus Persica] Anaphylaxis, Itching, Swelling  ? Penicillins Anaphylaxis  ? * Has tolerated several cephalosporins* ?Has patient had a PCN reaction causing immediate rash, facial/tongue/throat swelling, SOB or lightheadedness with hypotension: Unknown ?Has patient had a PCN reaction causing severe rash involving mucus membranes or skin necrosis: Unknown ?Has patient had a PCN reaction that required hospitalization: Unknown ?Has patient had a PCN reaction occurring within the last 10 years: Unknown ?If all of the above answers are "NO", then may proceed with Cephalosporin use.  ? Ativan [lorazepam] Other (See Comments)  ? Aggressive-agitation  ? Prednisone Other (See Comments)  ? Bleeding nose bleeding and internal bleeding  ? Latex Rash  ? ?  ? ?  ?Medication List  ?  ? ?STOP taking these medications   ? ?aspirin 81 MG EC tablet ?  ?butalbital-acetaminophen-caffeine 50-325-40 MG tablet ?Commonly known as: FIORICET ?  ?diphenhydrAMINE 25 mg capsule ?Commonly known as: BENADRYL ?  ? ?  ? ?TAKE these medications   ? ?Acetaminophen Extra Strength 500 MG tablet ?Generic drug: acetaminophen ?Take 2 tablets (1,000 mg total) by mouth every 8 (eight) hours as needed (for pain). ?  ?furosemide 20 MG tablet ?Commonly known as: LASIX ?Take 1 tablet (20 mg total) by mouth daily. ?Start taking on: January 27, 2022 ?  ?ibuprofen 600 MG tablet ?Commonly known as: ADVIL ?Take 1 tablet (600 mg total) by mouth every 6 (six) hours. ?  ?lamoTRIgine 25 MG tablet ?Commonly known as: LaMICtal ?Take 1 tablet (25 mg total) by mouth daily. ?  ?levETIRAcetam 750 MG tablet ?Commonly known as: KEPPRA ?Take 2 tablets (1,500 mg total) by  mouth 2 (two) times daily. ?  ?magnesium oxide 400 MG tablet ?Commonly known as: MAG-OX ?Take 400 mg by mouth daily. ?  ?PRENATAL VITAMIN PO ?Take 1 capsule by mouth daily. ?  ? ?  ? ? ? ?Discharge home in stable condition ?Infant Feeding: Bottle and Breast ?Infant Disposition:home with mother ?Discharge instruction: per After Visit Summary and Postpartum booklet. ?Activity: Advance as tolerated. Pelvic rest for 6 weeks.  ?Diet: routine diet ?Anticipated Birth Control: PP IUD placed ?Postpartum Appointment:6 weeks ?Additional Postpartum F/U: BP check 1 week ?Future Appointments: ?Future Appointments  ?Date Time Provider [place]  ?02/02/2022  2:00 PM [facility] NURSE [facility]  ?03/06/2022  9:00 AM Goldammer, [name], LCSW [facility] None  ?03/09/2022  2:15 PM Donnamae Jude, MD [facility]  ?03/26/2022  2:00 PM Goldammer, [name], LCSW [facility] None  ? ?Karsten Fells, DO PGY-1 ?01/26/2022, 11:43 AM ? ?GME ATTESTATION:  ?I saw and evaluated the patient. I agree with the findings and the plan of care as documented in the resident?s note and made all necessary edits. ? ?Renard Matter, MD, MPH ?OB Fellow, Faculty Practice ?[place] [facility] ?01/26/2022 11:48 AM ? ? ? ? ?

## 2022-01-24 NOTE — Progress Notes (Signed)
At 0012, RN called to bedside. Pt experiencing seizure like activity. Pt non-responsive to voice or sternal rub. Pt body stiffened, eyes rolled back in head for 10 seconds, and right arm went limb. Dr. Ephriam Jenkins, Barnett Hatter, SNM, and Sharen Counter, CNM to bedside. Pt became responsive at 0013 and was able to respond to commands stating she was tired and her head hurt. Vital signs were taken and were WNL. Provider explained what occurred  to pt and pt shook her head in understanding. Family at bedside. Provider to order follow up labs.   ?

## 2022-01-24 NOTE — Progress Notes (Signed)
Erin Krueger is a 26 y.o. [phone] at [redacted]w[redacted]d by ultrasound admitted for induction of labor due to prolonged fetal tachycardia. ? ?Subjective: ?Pt resting comfortably with epidural. Reports feeling increasing pressure with CTXs.   ? ?Objective: ?BP 109/73   Pulse 86   Temp 98 ?F (36.7 ?C) (Oral)   Resp 16   Ht 5\' 6"  (1.676 m)   Wt 100.7 kg   LMP  (LMP Unknown)   SpO2 97%   BMI 35.83 kg/m?  ?No intake/output data recorded. ?Total I/O ?In: -  ?Out: 500 [Urine:500] ? ?FHT:  FHR: 130 bpm, variability: moderate,  accelerations:  Present,  decelerations:  Absent ?UC:   regular, every 3.5-4 minutes ?SVE:   Dilation: 6.5 ?Effacement (%): 60 ?Station: -3 ?Exam by:: [email or web address], SNM ? ?Labs: ?Lab Results  ?Component Value Date  ? WBC 20.3 (H) 01/24/2022  ? HGB 10.5 (L) 01/24/2022  ? HCT 31.6 (L) 01/24/2022  ? MCV 91.3 01/24/2022  ? PLT 255 01/24/2022  ? ? ?Assessment / Plan: ?mIOL at 36.1 in normal labor pattern. Plan for IUPC now and pitocin titration as tolerated.  ? ?Labor:  continue pitocin ?Preeclampsia:  labs stable ?Fetal Wellbeing:  Category I ?Pain Control:  Epidural ?I/D:  n/a ?Anticipated MOD:  NSVD ? ?03/26/2022, SNM ?01/24/2022, 6:37 AM ? ? ?

## 2022-01-24 NOTE — Progress Notes (Signed)
Called to bedside by RN reporting pt having seizure activity. Pt support persons called out to report pt with deviated gaze, stiffened extremities, and limp left arm. RN entered the room and pt not responsive to voice so sternal rub applied. Pt responsive to sternal rub and alert and appropriate but groggy when CNM and CNM student entered the room.  ?FHR tracing remained Category I with accelerations. Pt VS wnl.  ? ?Pt reports headache, was treated ~ 30 minutes prior with Tylenol 650 mg PO without resolution.   ? ?BP 113/76   Pulse 94   Temp 98.1 ?F (36.7 ?C) (Oral)   Resp 15   Ht 5\' 6"  (1.676 m)   Wt 100.7 kg   LMP  (LMP Unknown)   SpO2 97%   BMI 35.83 kg/m?   ? ?Notified Dr .  PEC labs including CMP ordered stat.  Will treat h/a with Fioricet x 1 tab since Tylenol given recently.  Will continue to monitor.  ? ?Vergie Living, CNM ?12:36 AM  ? ?

## 2022-01-24 NOTE — Lactation Note (Signed)
This note was copied from a baby's chart. ?Lactation Consultation Note ? ?Patient Name: Erin Krueger ?Today's Date: 01/24/2022 ?Reason for consult: L&D Initial assessment ?Age:25 hours ?See mom MR Keppra is compatible with breastfeeding. ?This is mom's first time breastfeeding she did not breastfeed her other children. ?Mom latched infant on her left breast using the cradle hold position, infant latched with depth and infant BF for 20 minutes. ?Mom was doing stimulation techniques breastfeeding infant skin to skin, breast compressions and gently stoking neck, shoulder and hands. ?Mom knows to breastfeed infant according to primal cues, 8 to 12+ or more times within 24 hours. ?Mom knows to call RN/LC if she has any questions, concern or need further assistance with latching infant at the breast. ?LC congratulated parents on the birth of their daughter. ?Maternal Data ?Does the patient have breastfeeding experience prior to this delivery?: No ? ?Feeding ?Mother's Current Feeding Choice: Breast Milk and Formula ? ?LATCH Score ?Latch: Grasps breast easily, tongue down, lips flanged, rhythmical sucking. ? ?Audible Swallowing: A few with stimulation ? ?Type of Nipple: Everted at rest and after stimulation ? ?Comfort (Breast/Nipple): Soft / non-tender ? ?Hold (Positioning): Assistance needed to correctly position infant at breast and maintain latch. ? ?LATCH Score: 8 ? ? ?Lactation Tools Discussed/Used ?  ? ?Interventions ?Interventions: Assisted with latch;Skin to skin;Reverse pressure;Breast compression;Adjust position;Support pillows;Position options;Education ? ?Discharge ?  ? ?Consult Status ?Consult Status: Follow-up from L&D ?Follow-up type: In-patient ? ? ? ?Danelle Earthly ?01/24/2022, 5:53 PM ? ? ? ?

## 2022-01-24 NOTE — Progress Notes (Signed)
Erin Krueger is a 26 y.o. [phone] at [redacted]w[redacted]d b admitted for non-reassuring fetal testing  ? ?Subjective: ?Patient wants to understand why she was 6cm when the night shift checked her and she is 4cm now. Otherwise doing well, comfortable with epidural.  ? ?Objective: ?BP 112/89   Pulse 82   Temp 98.1 ?F (36.7 ?C) (Oral)   Resp 16   Ht 5\' 6"  (1.676 m)   Wt 100.7 kg   LMP  (LMP Unknown)   SpO2 97%   BMI 35.83 kg/m?  ?I/O last 3 completed shifts: ?In: -  ?Out: 500 [Urine:500] ?Total I/O ?In: -  ?Out: 500 [Urine:500] ? ?FHT:  FHR: 125 bpm, variability: moderate,  accelerations:  Present,  decelerations:  Absent ?UC:   regular, every 2-3 minutes ?SVE:   Dilation: 4 ?Effacement (%): 50 ?Station: -2 ?Exam by:: Nira Conn CNM ? ?Labs: ?Lab Results  ?Component Value Date  ? WBC 20.3 (H) 01/24/2022  ? HGB 10.5 (L) 01/24/2022  ? HCT 31.6 (L) 01/24/2022  ? MCV 91.3 01/24/2022  ? PLT 255 01/24/2022  ? ? ?Assessment / Plan: ?Induction of labor due to non-reassuring fetal testing,  progressing well on pitocin ? ?Labor: Progressing normally ?Preeclampsia:   NA ?Fetal Wellbeing:  Category I ?Pain Control:  Epidural ?I/D:  n/a ?Anticipated MOD:  NSVD ? ?DW patient at length how cervical exams can be different, and that I agree she is 4cm now. Patient reassured that overall all is well and looks normal. Will continue to increase pitocin and use frequent position changes to promote fetal rotation and cervical dilation. She is in agreement with this plan and all questions answered.  ? ?Marcille Buffy DNP, CNM  ?01/24/22  10:11 AM  ? ? ? ?

## 2022-01-24 NOTE — Progress Notes (Signed)
Erin Krueger is a 26 y.o. [phone] at [redacted]w[redacted]d by ultrasound admitted for induction of labor due to prolonged fetal tachycardia. ? ?Subjective: ?Erin Krueger is coping with CTXs with epidural on board. Reports resolved HA following intake of clear liquids by RN. Reports feeling pressure in rectal area but no discomfort from CTXs.  ? ?Objective: ?BP 126/87   Pulse 91   Temp 98.1 ?F (36.7 ?C) (Oral)   Resp 15   Ht 5\' 6"  (1.676 m)   Wt 100.7 kg   LMP  (LMP Unknown)   SpO2 97%   BMI 35.83 kg/m?  ?No intake/output data recorded. ?No intake/output data recorded. ? ?FHT:  FHR: 135 bpm, variability: moderate,  accelerations:  Present,  decelerations:  Absent ?UC:   4-6 ?SVE:   Dilation: 6.5 ?Effacement (%): 60 ?Station: -3 ?Exam by:: Greggory Keen, SNM ? ?Labs: ?Lab Results  ?Component Value Date  ? WBC 20.3 (H) 01/24/2022  ? HGB 10.5 (L) 01/24/2022  ? HCT 31.6 (L) 01/24/2022  ? MCV 91.3 01/24/2022  ? PLT 255 01/24/2022  ? ? ?Assessment / Plan: ?mIOL progressing normally at [redacted]w[redacted]d. Improving vaginal bleeding with SVE. Continue current management plan with AROM now ? ?Labor: Progressing on Pitocin, will continue to increase then AROM now ?Preeclampsia:  labs stable ?Fetal Wellbeing:  Category I ?Pain Control:  Epidural ?I/D:  n/a ?Anticipated MOD:  NSVD ? ?Shella Maxim, SNM ?01/24/2022, 2:03 AM ? ? ?

## 2022-01-24 NOTE — Lactation Note (Signed)
This note was copied from a baby's chart. ?Lactation Consultation Note ? ?Patient Name: Erin Krueger ?Today's Date: 01/24/2022 ?Age:25 hours ? ?[place] contacted by Terance Ice regarding mother's medication. Per RN, mother takes 1500 mg BID of Keppra and wanted to confirm it is safe with breastfeeding. LC checked LactMed information about Levetiracetam; and, also talked to [place] Pharmacist from [facility] [place]. No need to discontinue breastfeeding but the infant should be monitored for sedation/drowsiness, adequate weight gain and pediatrician must be informed of mother's medication and dose.  ? ?[name] ?01/24/2022, 5:13 PM ? ? ? ?

## 2022-01-25 LAB — CBC
HCT: 29.1 % — ABNORMAL LOW (ref 36.0–46.0)
Hemoglobin: 9.8 g/dL — ABNORMAL LOW (ref 12.0–15.0)
MCH: 30.6 pg (ref 26.0–34.0)
MCHC: 33.7 g/dL (ref 30.0–36.0)
MCV: 90.9 fL (ref 80.0–100.0)
Platelets: 229 10*3/uL (ref 150–400)
RBC: 3.2 MIL/uL — ABNORMAL LOW (ref 3.87–5.11)
RDW: 12.5 % (ref 11.5–15.5)
WBC: 18.7 10*3/uL — ABNORMAL HIGH (ref 4.0–10.5)
nRBC: 0 % (ref 0.0–0.2)

## 2022-01-25 LAB — CULTURE, BETA STREP (GROUP B ONLY): Strep Gp B Culture: POSITIVE — AB

## 2022-01-25 NOTE — Progress Notes (Signed)
Post Partum Day 1 ?Subjective: ?Erin Krueger  is a 26 y.o. [id number] s/p SVD at [redacted]w[redacted]d.  She reports she is doing well. No acute events overnight. She denies any problems with ambulating, voiding or po intake. Denies nausea or vomiting.  Pain is well controlled on ibuprofen.  Lochia is moderate and improving. ? ?Objective: ?Blood pressure 113/77, pulse 75, temperature (!) 97.5 ?F (36.4 ?C), temperature source Oral, resp. rate 16, height 5\' 6"  (1.676 m), weight 100.7 kg, SpO2 100 %, unknown if currently breastfeeding. ? ?Physical Exam:  ?General: alert, cooperative, and no distress ?Lochia: appropriate ?Uterine Fundus: firm, U-1 ?Incision: N/A ?DVT Evaluation: No evidence of DVT seen on physical exam. ?Negative Homan's sign. ?No cords or calf tenderness. ?No significant calf/ankle edema. ? ?Recent Labs  ?  01/24/22 ?03/26/22 01/25/22 ?0501  ?HGB 10.5* 9.8*  ?HCT 31.6* 29.1*  ? ? ?Assessment/Plan: ?Plan for discharge tomorrow, Breastfeeding, Circumcision prior to discharge, and Contraception ppIUD ? ? LOS: 2 days  ? ?03/27/22, CNM ?01/25/2022, 6:59 AM  ? ? ?

## 2022-01-25 NOTE — Lactation Note (Addendum)
This note was copied from a baby's chart. ?Lactation Consultation Note ? ?Patient Name: Erin Krueger ?Today's Date: 01/25/2022 ?Reason for consult: Initial assessment;1st time breastfeeding;Late-preterm 34-36.6wks ?Age:26 hours ? ?LC in to visit with P4 Mom of LPTI.  Mom shared that she had changed her mind about breastfeeding as baby doesn't like breastfeeding.  Mom also shared that she is concerned about the medication she takes causing baby to have sleepiness and difficulty feeding.  Mom is choosing to exclusively formula feed.  ?LC shared with Mom that feeding difficulty is normal in a LPTI and usually Moms need to pump frequently to establish their milk supply.  Mom also shared she didn't have milk with her other babies.  ?Lactation services complete. ? ? ?Consult Status ?Consult Status: Complete ?Date: 01/25/22 ?Follow-up type: Call as needed ? ? ? ?Johny Blamer E ?01/25/2022, 9:52 AM ? ? ? ?
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
## 2022-01-25 NOTE — Anesthesia Postprocedure Evaluation (Signed)
Anesthesia Post Note ? ?Patient: ELYANAH FARINO ? ?Procedure(s) Performed: AN AD HOC LABOR EPIDURAL ? ?  ? ?Patient location during evaluation: Mother Baby ?Anesthesia Type: Epidural ?Level of consciousness: awake and alert ?Pain management: pain level controlled ?Vital Signs Assessment: post-procedure vital signs reviewed and stable ?Respiratory status: spontaneous breathing, nonlabored ventilation and respiratory function stable ?Cardiovascular status: stable ?Postop Assessment: no headache, no backache, epidural receding, no apparent nausea or vomiting, patient able to bend at knees, adequate PO intake and able to ambulate ?Anesthetic complications: no ? ? ?No notable events documented. ? ?Last Vitals:  ?Vitals:  ? 01/24/22 2333 01/25/22 0340  ?BP: 101/64 113/77  ?Pulse: 73 75  ?Resp: 18 16  ?Temp: 36.4 ?C (!) 36.4 ?C  ?SpO2: 99% 100%  ?  ?Last Pain:  ?Vitals:  ? 01/25/22 0340  ?TempSrc: Oral  ?PainSc:   ? ?Pain Goal:   ? ?  ?  ?  ?  ?  ?  ?  ? ?Antara Brecheisen Hristova ? ? ? ? ?

## 2022-01-26 ENCOUNTER — Encounter: Payer: Self-pay | Admitting: Family Medicine

## 2022-01-26 ENCOUNTER — Encounter: Payer: Self-pay | Admitting: Obstetrics and Gynecology

## 2022-01-26 ENCOUNTER — Other Ambulatory Visit (HOSPITAL_COMMUNITY): Payer: Self-pay

## 2022-01-26 DIAGNOSIS — Z975 Presence of (intrauterine) contraceptive device: Secondary | ICD-10-CM | POA: Insufficient documentation

## 2022-01-26 DIAGNOSIS — O99820 Streptococcus B carrier state complicating pregnancy: Secondary | ICD-10-CM | POA: Insufficient documentation

## 2022-01-26 MED ORDER — FUROSEMIDE 20 MG PO TABS
20.0000 mg | ORAL_TABLET | Freq: Every day | ORAL | 0 refills | Status: DC
  Filled 2022-01-26: qty 4, 4d supply, fill #0

## 2022-01-26 MED ORDER — FUROSEMIDE 20 MG PO TABS
20.0000 mg | ORAL_TABLET | Freq: Every day | ORAL | Status: DC
  Administered 2022-01-26: 20 mg via ORAL
  Filled 2022-01-26: qty 1

## 2022-01-26 MED ORDER — LAMOTRIGINE 25 MG PO TABS
25.0000 mg | ORAL_TABLET | Freq: Every day | ORAL | 0 refills | Status: DC
Start: 2022-01-26 — End: 2022-07-07
  Filled 2022-01-26: qty 30, 30d supply, fill #0

## 2022-01-26 MED ORDER — ACETAMINOPHEN 500 MG PO TABS
1000.0000 mg | ORAL_TABLET | Freq: Three times a day (TID) | ORAL | 0 refills | Status: DC | PRN
  Filled 2022-01-26: qty 60, 10d supply, fill #0

## 2022-01-26 MED ORDER — IBUPROFEN 600 MG PO TABS
600.0000 mg | ORAL_TABLET | Freq: Four times a day (QID) | ORAL | 0 refills | Status: DC
  Filled 2022-01-26: qty 40, 10d supply, fill #0

## 2022-01-26 NOTE — Progress Notes (Signed)
Dr. Ephriam Jenkins aware Patient never got set up with the babyscripts before DC. ?

## 2022-01-26 NOTE — Social Work (Signed)
CSW spoke with [place] CPS SW T. Phillips and was informed infant is to discharge with MOB brother: ? ?[facility] ?[address] [place], [place] [address] ?[phone] ? ?A copy of identification is needed, to be placed in infant chart at discharge.  ? ?No barriers to discharge.  ? ?Sky Primo, LCSW ?Clinical Social Work ?[facility] ?[phone] ? ?

## 2022-01-26 NOTE — Progress Notes (Signed)
Patient states her insurance does not accept Babyscripts log in. Dr. Cy Blamer aware of issue and is investigating. ?

## 2022-01-27 ENCOUNTER — Telehealth: Payer: Self-pay | Admitting: Clinical

## 2022-01-27 NOTE — Telephone Encounter (Signed)
Brief call, as agreed-upon for postpartum. Pt is understandably frustrated and confused as to why CPS took her baby from the hospital, without meeting or talking to her first.  ? ?Pt says CPS told her she had not been receiving any behavioral health care, and needed to take parenting classes, etc., but pt has attended all available parenting classes, and has  attended six appointments with Integrated Behavioral Health Clinician at her ob/gyn practice (from 10/08/21 - 01/01/22), and after referred to [facility] for ongoing therapy, she has seen therapist at [facility] outpatient twice (starting 01/16/22), and has an additional two scheduled appointments with her therapist at [facility].  ? ?Pt started breastfeeding her daughter in the hospital, is pumping now, but has transportation problems getting milk to [place], [place], where baby was placed with her brother. She is heartbroken at the unnecessary and unjust separation from her daughter.  ? ? ?

## 2022-01-28 ENCOUNTER — Encounter: Payer: Medicaid Other | Admitting: Family Medicine

## 2022-01-30 ENCOUNTER — Inpatient Hospital Stay (HOSPITAL_COMMUNITY)
Admission: AD | Admit: 2022-01-30 | Payer: Medicaid Other | Source: Home / Self Care | Admitting: Obstetrics & Gynecology

## 2022-01-30 ENCOUNTER — Inpatient Hospital Stay (HOSPITAL_COMMUNITY): Payer: Medicaid Other

## 2022-02-02 ENCOUNTER — Ambulatory Visit: Payer: Self-pay

## 2022-02-10 ENCOUNTER — Ambulatory Visit (INDEPENDENT_AMBULATORY_CARE_PROVIDER_SITE_OTHER): Payer: Medicaid Other | Admitting: Clinical

## 2022-02-10 DIAGNOSIS — Z658 Other specified problems related to psychosocial circumstances: Secondary | ICD-10-CM

## 2022-02-10 DIAGNOSIS — F4310 Post-traumatic stress disorder, unspecified: Secondary | ICD-10-CM

## 2022-02-10 NOTE — BH Specialist Note (Signed)
Integrated Behavioral Health via Telemedicine Visit ? ?02/10/2022 ?[name] ?[id number] ? ?Number of Integrated Behavioral Health Clinician visits: Additional Visit ? ?Session Start time: 1015 ?  ?Session End time: 1050 ? ?Total time in minutes: 35 ? ? ?Referring Provider: Shawna Clamp, CNM ?Patient/Family location: Home ?[facility] Provider location: [facility] for [employer] at [facility] ? ?All persons participating in visit: [name] and [facility] Erin Krueger [facility]  ? ?Types of Service: Individual psychotherapy and Video visit ? ?I connected with Erin Krueger and/or Erin Krueger's  boyfriend/FOB  via  Telephone or Engineer, civil (consulting)  (Video is Surveyor, mining) and verified that I am speaking with the correct person using two identifiers. Discussed confidentiality: Yes  ? ?I discussed the limitations of telemedicine and the availability of in person appointments.  Discussed there is a possibility of technology failure and discussed alternative modes of communication if that failure occurs. ? ?I discussed that engaging in this telemedicine visit, they consent to the provision of behavioral healthcare and the services will be billed under their insurance. ? ?Patient and/or legal guardian expressed understanding and consented to Telemedicine visit: Yes  ? ?Presenting Concerns: ?Patient and/or family reports the following symptoms/concerns: Emotional distress at being separated from daughter, leading to poor appetite and poor sleep and crying spells that resolve upon visiting with daughter.  ?Duration of problem:  ?About two weeks; Severity of problem:  moderately severe ? ?Patient and/or Family's Strengths/Protective Factors: ?Social connections, Concrete supports in place (healthy food, safe environments, etc.), Sense of purpose, Physical Health (exercise, healthy diet, medication compliance, etc.), and Caregiver has knowledge of parenting &  child development ? ?Goals Addressed: ?Patient will: ? Reduce symptoms of: anxiety, depression, and stress  ? Demonstrate ability to: Increase healthy adjustment to current life circumstances ? ?Progress towards Goals: ?Ongoing ? ?Interventions: ?Interventions utilized:  Functional Assessment of ADLs, Link to [employer], and Supportive Reflection ?Standardized Assessments completed:  not given today ? ?Patient and/or Family Response: Patient agrees with treatment plan. ? ? ?Assessment: ?Patient currently experiencing Grief (regarding separation from newborn daughter), PTSD and Psychosocial stress.  ? ?Patient may benefit from ongoing therapy with established therapist at [facility]. ? ?Plan: ?Follow up with behavioral health clinician on : Call Erin Krueger at [phone], as needed. ?Behavioral recommendations:  ?-Continue with plan to attend upcoming therapy appointment at [facility] ?-Consider consulting legal advice regarding unwarranted CPS case as soon as possible ([place] [employer]) ?-Prioritize healthy self-care daily while waiting for daughter to be returned home ?Referral(s): Integrated Art gallery manager (In Clinic) and [employer]:  legal ? ?I discussed the assessment and treatment plan with the patient and/or parent/guardian. They were provided an opportunity to ask questions and all were answered. They agreed with the plan and demonstrated an understanding of the instructions. ?  ?They were advised to call back or seek an in-person evaluation if the symptoms worsen or if the condition fails to improve as anticipated. ? ?Erin Lips, LCSW ? ?Depression screen [facility] 2/9 01/22/2022 01/16/2022 12/09/2021 12/01/2021 11/03/2021  ?Decreased Interest 0 0 0 0 0  ?Down, Depressed, Hopeless 0 1 0 0 0  ?PHQ - 2 Score 0 1 0 0 0  ?Altered sleeping 0 - 0 1 0  ?Tired, decreased energy 0 - 0 0 0  ?Change in appetite 0 - 0 0 0  ?Feeling bad or failure about yourself  0 - 0 0 0  ?Trouble concentrating 0 - 0 0 0  ?  Moving  slowly or fidgety/restless 0 - 0 0 0  ?Suicidal thoughts 0 - 0 0 0  ?PHQ-9 Score 0 - 0 1 0  ?Difficult doing work/chores - - Not difficult at all - -  ?Some recent data might be hidden  ? ?GAD 7 : Generalized Anxiety Score 01/22/2022 01/16/2022 12/09/2021 12/01/2021  ?Nervous, Anxious, on Edge 0 1 0 0  ?Control/stop worrying 0 1 0 0  ?Worry too much - different things 0 0 0 0  ?Trouble relaxing 0 2 0 0  ?Restless 0 1 0 0  ?Easily annoyed or irritable 0 1 0 0  ?Afraid - awful might happen 0 0 0 0  ?Total GAD 7 Score 0 6 0 0  ?Anxiety Difficulty - Somewhat difficult Not difficult at all -  ? ? ? ?

## 2022-02-10 NOTE — Patient Instructions (Signed)
[employer] [place] ?[email or web address]  ?[address] ?[place], [place] [address] ?[phone] ? ?Free Legal Aid Consultation [phone] ?[email or web address] ?

## 2022-02-20 ENCOUNTER — Inpatient Hospital Stay (HOSPITAL_COMMUNITY): Admit: 2022-02-20 | Payer: Self-pay

## 2022-03-06 ENCOUNTER — Ambulatory Visit (INDEPENDENT_AMBULATORY_CARE_PROVIDER_SITE_OTHER): Payer: Medicaid Other | Admitting: Licensed Clinical Social Worker

## 2022-03-06 ENCOUNTER — Encounter (HOSPITAL_COMMUNITY): Payer: Self-pay

## 2022-03-06 DIAGNOSIS — F4310 Post-traumatic stress disorder, unspecified: Secondary | ICD-10-CM | POA: Diagnosis not present

## 2022-03-06 DIAGNOSIS — F902 Attention-deficit hyperactivity disorder, combined type: Secondary | ICD-10-CM

## 2022-03-06 NOTE — Progress Notes (Signed)
? ?THERAPIST PROGRESS NOTE ? ?Virtual Visit via Video Note ? ?I connected with Erin Krueger on 03/06/22 at  9:00 AM EDT by a video enabled telemedicine application and verified that I am speaking with the correct person using two identifiers. ? ?Location: ?Patient: Patient home  ?Provider: Providers Home  ?  ?I discussed the limitations of evaluation and management by telemedicine and the availability of in person appointments. The patient expressed understanding and agreed to proceed. ? ? ? ?  ?I discussed the assessment and treatment plan with the patient. The patient was provided an opportunity to ask questions and all were answered. The patient agreed with the plan and demonstrated an understanding of the instructions. ?  ?The patient was advised to call back or seek an in-person evaluation if the symptoms worsen or if the condition fails to improve as anticipated. ? ?I provided 50 minutes of non-face-to-face time during this encounter. ? ? ?Dory Horn, LCSW  ? ?Participation Level: Active ? ?Behavioral Response: CasualAlertAnxious and Depressed ? ?Type of Therapy: Individual Therapy ? ?Treatment Goals addressed: Decrease PHQ-9 below 10  ? ?ProgressTowards Goals: Met ? ?Interventions: CBT, Motivational Interviewing, and Supportive ? ? ?Suicidal/Homicidal: Nowithout intent/plan ? ?Therapist Response:  ? ?Erin Krueger was alert and oriented x 5. She was dressed casually and engaged well in therapy session. Erin Krueger was pleasant, cooperative, and maintained good eye contact. She presented with euthymic mood affect.  ? Erin Krueger started out session by stating that she moved to [place]. LCSW does note that at [facility], we are only supposed to see [facility] residents. LCSW did reach out to [place] office in that area via secure chat to see if the LCSW out there would be willing to take her on. LCSW was agreeable to see her until a transfer could be made. Erin Krueger reports that her primary stressors are for legal  custody. She reports that she had her baby 6 weeks ago and CPS did take that baby away. They are concerned with Erin Krueger mental health stability. She is not taking medications currently. In today's session LCSW did attempt to explain the benefits of medications and that a medication assessment could be beneficial. Erin Krueger reports that her child is now in the custody of her brother who also has full custody of Erin Krueger 30-year-old as well. Erin Krueger reports her overall goal is to get the baby into her custody.  ? Intervention/Plan:  LCSW administered GAD-7 and Erin Krueger increased score from a 6 to a 15. Erin Krueger reports that her child was taken out of her custody, and it has been stressful process. LCSW provided Erin Krueger for 5,4,3,2,1 grounding technique to help decrease anxiety. LCSW administered a PHQ-9 and Erin Krueger scored below a ten which is her goal/objective. LCSW used narrative exposure therapy to help Erin Krueger work through trauma, LCSW used praised and encouragement for supportive therapy. LCSW utilized reframing in today's session  ? ? ?Plan: Return again in 4 weeks. ? ?Diagnosis: PTSD (post-traumatic stress disorder) ? ?ADHD (attention deficit hyperactivity disorder), combined type ? ?Collaboration of Care: Other None in today's session  ? ?Patient/Guardian was advised Release of Information must be obtained prior to any record release in order to collaborate their care with an outside provider. Patient/Guardian was advised if they have not already done so to contact the registration department to sign all necessary forms in order for [place] to release information regarding their care.  ? ?Consent: Patient/Guardian gives verbal consent for treatment and assignment of benefits for services provided  during this visit. Patient/Guardian expressed understanding and agreed to proceed.  ? ?Dory Horn, LCSW ?03/06/2022 ? ?

## 2022-03-06 NOTE — Plan of Care (Signed)
?  Problem: PTSD-Trauma Disorder CCP Problem  1 PTSD  ?Goal:  Walk 3 x weekly  ?Outcome: Progressing ?Goal: Decrease GAD_7 below 5  ?Outcome: Progressing ?Goal: STG: Practice interpersonal effectiveness skills 3 times per week for the next 3 weeks ?Outcome: Progressing ?Goal: STG: Erin Krueger WILL PRACTICE CONFLICT RESOLUTION SKILLS AT LEAST 1 PER WEEK FOR THE NEXT 10 WEEKS ?Outcome: Progressing ?Intervention: ENCOURAGE Erin Krueger TO PRACTICE BREATHING RETRAINING FOR 10 MINUTES, 3 TIMES PER DAY ?Intervention: WORK WITH Erin Krueger TO COMPLETE THE PHQ-9 DURING [facility] INDIVIDUAL SESSION ?Intervention: EDUCATE Erin Krueger ON COMMON REACTIONS TO A TRAUMATIC EXPERIENCE ?  ?

## 2022-03-09 ENCOUNTER — Ambulatory Visit: Payer: Self-pay | Admitting: Family Medicine

## 2022-03-09 ENCOUNTER — Telehealth: Payer: Self-pay

## 2022-03-09 ENCOUNTER — Encounter: Payer: Self-pay | Admitting: Family Medicine

## 2022-03-09 NOTE — Telephone Encounter (Signed)
Patient did not keep post partum appointment. Called pt; invalid number. Called pt contact; VM left stating I am calling for [facility], requested she return call. Callback number given. ?

## 2022-03-09 NOTE — Progress Notes (Signed)
Patient did not keep appointment today. She will be called to reschedule.  

## 2022-03-09 NOTE — Progress Notes (Deleted)
? ? ?  Post Partum Visit Note ? ?Erin Krueger is a 26 y.o. [phone] female who presents for a postpartum visit. She is 6 weeks 2 days postpartum following a normal spontaneous vaginal delivery.  I have fully reviewed the prenatal and intrapartum course. The delivery was at 36w 1d.  Anesthesia: epidural. Postpartum course has been ***. Baby is doing well***. Baby is feeding by {breastmilk/bottle:69}. Bleeding {vag bleed:12292}. Bowel function is {normal:32111}. Bladder function is {normal:32111}. Patient {is/is not:9024} sexually active. Contraception method is IUD. Postpartum depression screening: {gen negative/positive:315881}. ? ? ?The pregnancy intention screening data noted above was reviewed. Potential methods of contraception were discussed. The patient elected to proceed with No data recorded. ? ? ? ?There are no preventive care reminders to display for this patient. ? ?{Common ambulatory SmartLinks:19316} ? ?Review of Systems ?{ros; complete:30496} ? ?Objective:  ?LMP  (LMP Unknown)   ? ?General:  {gen appearance:16600}  ? Breasts:  {desc; normal/abnormal/not indicated:14647}  ?Lungs: {lung exam:16931}  ?Heart:  {heart exam:5510}  ?Abdomen: {abdomen exam:16834}   ?Wound {Wound assessment:11097}  ?GU exam:  {desc; normal/abnormal/not indicated:14647}  ?     ?Assessment:  ? ? There are no diagnoses linked to this encounter. ? ?*** postpartum exam.  ? ?Plan:  ? ?Essential components of care per ACOG recommendations: ? ?1.  Mood and well being: Patient with {gen negative/positive:315881} depression screening today. Reviewed local resources for support.  ?- Patient tobacco use? {tobacco use:25506}  ?- hx of drug use? {yes/no:25505}   ? ?2. Infant care and feeding:  ?-Patient currently breastmilk feeding? {yes/no:25502}  ?-Social determinants of health (SDOH) reviewed in EPIC. No concerns***The following needs were identified*** ? ?3. Sexuality, contraception and birth spacing ?- Patient {DOES_DOES HUT:65465}  want a pregnancy in the next year.  Desired family size is {NUMBER 1-10:22536} children.  ?- Reviewed reproductive life planning. Reviewed contraceptive methods based on pt preferences and effectiveness.  Patient desired {Upstream End Methods:24109} today.   ?- Discussed birth spacing of 18 months ? ?4. Sleep and fatigue ?-Encouraged family/partner/community support of 4 hrs of uninterrupted sleep to help with mood and fatigue ? ?5. Physical Recovery  ?- Discussed patients delivery and complications. She describes her labor as {description:25511} ?- Patient had a {CHL AMB DELIVERY:[phone]}. Patient had a {laceration:25518} laceration. Perineal healing reviewed. Patient expressed understanding ?- Patient has urinary incontinence? {yes/no:25515} ?- Patient {ACTION; IS/IS KPT:46568127} safe to resume physical and sexual activity ? ?6.  Health Maintenance ?- HM due items addressed {Yes or If no, why not?:20788} ?- Last pap smear  ?Diagnosis  ?Date Value Ref Range Status  ?08/12/2021   Final  ? - Negative for Intraepithelial Lesions or Malignancy (NILM)  ?08/12/2021 - Benign reactive/reparative changes  Final  ? Pap smear not done at today's visit.  ?-Breast Cancer screening indicated? {indicated:25516} ? ?7. Chronic Disease/Pregnancy Condition follow up: {Follow up:25499} ? ?- PCP follow up ? ?Marjo Bicker, RN ?Center for [employer], [facility] ? ?

## 2022-03-23 ENCOUNTER — Telehealth: Payer: Self-pay | Admitting: Clinical

## 2022-03-23 NOTE — Telephone Encounter (Signed)
Brief phone check: pt looking forward to bringing baby home by the end of the week, after being set up with in-home services, via CPS; will continue with ongoing therapy at [facility].  ?

## 2022-03-26 ENCOUNTER — Ambulatory Visit (INDEPENDENT_AMBULATORY_CARE_PROVIDER_SITE_OTHER): Payer: Medicaid Other | Admitting: Licensed Clinical Social Worker

## 2022-03-26 DIAGNOSIS — F39 Unspecified mood [affective] disorder: Secondary | ICD-10-CM

## 2022-03-26 DIAGNOSIS — F4310 Post-traumatic stress disorder, unspecified: Secondary | ICD-10-CM | POA: Diagnosis not present

## 2022-03-26 NOTE — Progress Notes (Signed)
? ?THERAPIST PROGRESS NOTE ? ?Virtual Visit via Video Note ? ?I connected with [name] on 03/26/22 at  2:00 PM EDT by a video enabled telemedicine application and verified that I am speaking with the correct person using two identifiers. ? ?Location: ?Patient: Erin Krueger  ?Provider: Providers Home  ?  ?I discussed the limitations of evaluation and management by telemedicine and the availability of in person appointments. The patient expressed understanding and agreed to proceed. ? ? ?  ?I discussed the assessment and treatment plan with the patient. The patient was provided an opportunity to ask questions and all were answered. The patient agreed with the plan and demonstrated an understanding of the instructions. ?  ?The patient was advised to call back or seek an in-person evaluation if the symptoms worsen or if the condition fails to improve as anticipated. ? ?I provided 30 minutes of non-face-to-face time during this encounter. ? ? ?[name], LCSW  ? ?Participation Level: Active ? ?Behavioral Response: CasualAlertAnxious and Depressed ? ?Type of Therapy: Individual Therapy ? ?Treatment Goals addressed: Decrease PHQ-9 below 10 and GAD-7 below 5  ? ?ProgressTowards Goals: Progressing ? ?Interventions: CBT, Motivational Interviewing, and Supportive ? ? ?Suicidal/Homicidal: Nowithout intent/plan ? ?Therapist Response:  ? ? Pt was alert and oriented x 5. She presented with flat and depressed mood/affect. She was pleasant, cooperative, and maintained good eye contact. She engaged well in therapy session and was dressed casually.  ? Pt reports stressors as relationship, legal, and grief/loss. LCSW notes that pt hair was died red in today's session and she was in a semi-truck. LCSW inquired to both pt reports that she want to get her hair back to a blonde style and that is why her hair is red today as she transitions. Pt states the reason she is in a semi-truck is because that is what  her new Erin does drives semi-trucks, LCSW inquiry what happened to her and her fianc? and states they were on a break, and he started seeing another girl plus he moved to [place]. Pt reports that DSS does not approve of pt picking up and taking Krueger trips as pt is attempting to get custody back of her daughter that was born 3 months ago. Pt reports that this was needed for her mental health. Other stressor for pt are for grief/loss. Pt reports her mother is very sick. She reports that her mom is her hero but pt does not believe she will make it another month. She reports that her brothers will not see her, and she is the only one that visits. Pt reports that she is struggling as her mother asks about her brothers but pt does not have the heart to tell her the truth.  ? Intervention/Plan: LCSW noted last session that pt moved to [facility]. LCSW provided pt phone number to  office for  Psychiatry as [facility] is only supposed to see [place] residents. Pt reports that she has been in contact with the new office but has not made an appointment yet. This will be pt last session with this LCSW as she reports verbally that she can make new appointment at [facility] office in the upcoming weeks. LCSW administered both a PHQ-9 and GAD-7. Pt improved on both those scores. LCSW does note some reckless behavior that need to be explored more such as getting a new Erin in less than 30 days of break up, coloring hair bright red, and  leaving to go on a Krueger trip with new Erin with pending DSS case. LCSW used language in today's session for praise, encouragement, and empowerment. LCSW educated pt on PHQ-9 and GAD-7 scores.  ? ?Plan: Return again in 4 weeks. ? ?Diagnosis: No diagnosis found. ? ?Collaboration of Care: Other referral to [facility]  ? ?Patient/Guardian was advised Release of Information must be obtained prior to any record release in order to  collaborate their care with an outside provider. Patient/Guardian was advised if they have not already done so to contact the registration department to sign all necessary forms in order for [place] to release information regarding their care.  ? ?Consent: Patient/Guardian gives verbal consent for treatment and assignment of benefits for services provided during this visit. Patient/Guardian expressed understanding and agreed to proceed.  ? ?[name], LCSW ?03/26/2022 ? ?

## 2022-03-27 ENCOUNTER — Telehealth (HOSPITAL_COMMUNITY): Payer: Self-pay | Admitting: Licensed Clinical Social Worker

## 2022-03-27 NOTE — Telephone Encounter (Signed)
Spoke with DSS Case Manager. Open CPS with youngest child. Pt reported to this LCSW they may call for compliance. LCSW explain that pt has been compliant with therapy only at this time, and no medication mgnt appointment has been made. LCSW went onto to explain that pt would need to f/u with [facility] in [place] as patient is no longer [facility] resident.  LCSW notes that patient does have Medicaid and will need to follow-up with them for further evaluation.  Patient is not far enough along in treatment to make a determination on mental health stabilization in regards to her child. ?

## 2022-04-10 ENCOUNTER — Other Ambulatory Visit (HOSPITAL_COMMUNITY): Payer: Self-pay

## 2022-04-19 ENCOUNTER — Other Ambulatory Visit: Payer: Self-pay

## 2022-04-19 ENCOUNTER — Emergency Department (HOSPITAL_COMMUNITY)
Admission: EM | Admit: 2022-04-19 | Discharge: 2022-04-19 | Disposition: A | Discharge status: Home / Self Care | Payer: Medicaid Other | Attending: Emergency Medicine | Admitting: Emergency Medicine

## 2022-04-19 ENCOUNTER — Encounter (HOSPITAL_COMMUNITY): Payer: Self-pay

## 2022-04-19 DIAGNOSIS — R569 Unspecified convulsions: Secondary | ICD-10-CM | POA: Diagnosis present

## 2022-04-19 DIAGNOSIS — N390 Urinary tract infection, site not specified: Secondary | ICD-10-CM

## 2022-04-19 DIAGNOSIS — Z91040 Latex allergy status: Secondary | ICD-10-CM | POA: Diagnosis not present

## 2022-04-19 LAB — CBC
HCT: 41.3 % (ref 36.0–46.0)
Hemoglobin: 13.7 g/dL (ref 12.0–15.0)
MCH: 29.1 pg (ref 26.0–34.0)
MCHC: 33.2 g/dL (ref 30.0–36.0)
MCV: 87.9 fL (ref 80.0–100.0)
Platelets: 250 10*3/uL (ref 150–400)
RBC: 4.7 MIL/uL (ref 3.87–5.11)
RDW: 13.7 % (ref 11.5–15.5)
WBC: 6.6 10*3/uL (ref 4.0–10.5)
nRBC: 0 % (ref 0.0–0.2)

## 2022-04-19 LAB — URINALYSIS, ROUTINE W REFLEX MICROSCOPIC
Bilirubin Urine: NEGATIVE
Glucose, UA: NEGATIVE mg/dL
Ketones, ur: NEGATIVE mg/dL
Nitrite: POSITIVE — AB
Protein, ur: 30 mg/dL — AB
Specific Gravity, Urine: 1.026 (ref 1.005–1.030)
WBC, UA: >50 WBC/hpf — ABNORMAL HIGH (ref 0–5)
pH: 6 (ref 5.0–8.0)

## 2022-04-19 LAB — COMPREHENSIVE METABOLIC PANEL
ALT: 58 U/L — ABNORMAL HIGH (ref 0–44)
AST: 33 U/L (ref 15–41)
Albumin: 4 g/dL (ref 3.5–5.0)
Alkaline Phosphatase: 72 U/L (ref 38–126)
Anion gap: 8 (ref 5–15)
BUN: 12 mg/dL (ref 6–20)
CO2: 21 mmol/L — ABNORMAL LOW (ref 22–32)
Calcium: 8.7 mg/dL — ABNORMAL LOW (ref 8.9–10.3)
Chloride: 112 mmol/L — ABNORMAL HIGH (ref 98–111)
Creatinine, Ser: 0.77 mg/dL (ref 0.44–1.00)
GFR, Estimated: >60 mL/min (ref >60)
Glucose, Bld: 89 mg/dL (ref 70–99)
Potassium: 4 mmol/L (ref 3.5–5.1)
Sodium: 141 mmol/L (ref 135–145)
Total Bilirubin: 0.8 mg/dL (ref 0.3–1.2)
Total Protein: 7.1 g/dL (ref 6.5–8.1)

## 2022-04-19 LAB — RAPID URINE DRUG SCREEN, HOSP PERFORMED
Amphetamines: NOT DETECTED
Barbiturates: NOT DETECTED
Benzodiazepines: POSITIVE — AB
Cocaine: NOT DETECTED
Opiates: NOT DETECTED
Tetrahydrocannabinol: POSITIVE — AB

## 2022-04-19 LAB — CBG MONITORING, ED: Glucose-Capillary: 101 mg/dL — ABNORMAL HIGH (ref 70–99)

## 2022-04-19 LAB — I-STAT BETA HCG BLOOD, ED (MC, WL, AP ONLY): I-stat hCG, quantitative: <5 m[IU]/mL (ref <5)

## 2022-04-19 LAB — ETHANOL: Alcohol, Ethyl (B): <10 mg/dL (ref <10)

## 2022-04-19 MED ORDER — NITROFURANTOIN MONOHYD MACRO 100 MG PO CAPS
100.0000 mg | ORAL_CAPSULE | Freq: Two times a day (BID) | ORAL | 0 refills | Status: DC
Start: 2022-04-19 — End: 2022-05-16

## 2022-04-19 NOTE — ED Triage Notes (Signed)
Patient brought in by EMS. EMS states patient had 1 seizure prior to arrival. Patient had 2 witnessed seizures with EMS on scene. EMS gave 2.5mg  versed IV. Patient is still post ictal. HX of epilepsy.

## 2022-04-19 NOTE — ED Provider Notes (Signed)
[facility] DEPT Provider Note   CSN: [id number] Arrival date & time: 04/19/22  2122     History {Add pertinent medical, surgical, social history, OB history to HPI:1} Chief Complaint  Patient presents with  . Seizures    Erin Krueger is a 26 y.o. female.  HPI Patient presenting for evaluation of seizures, multiple.  She was at home sitting on the couch when she suddenly "zoned out," and then began shaking.  Her boyfriend that is with her, states that both of her arms tensed up and were shaking for 3-1/2 minutes.  After that she was sleepy.  She then awoke and then as EMS arrived she had another shaking episode.  EMS report that she had her arms tight and her jaw tight so they thought she needed to be treated for seizure and gave her 2-1/2 mg of Versed.  This did not change her status much.  On arrival to the ED, she had another seizure that nursing described as tightness in both arms and not talking.  She also did not respond to a sternal rub at that time.  She has a history of seizures and epilepsy and takes antiepileptic medication.  She had a miscarriage sometime in the last couple months according to the boyfriend.  He states he is only been with her for about a month.    Home Medications Prior to Admission medications   Medication Sig Start Date End Date Taking? Authorizing Provider  acetaminophen (TYLENOL) 500 MG tablet Take 2 tablets (1,000 mg total) by mouth every 8 (eight) hours as needed (for pain). 01/26/22   Vennie Homans, MD  furosemide (LASIX) 20 MG tablet Take 1 tablet (20 mg total) by mouth daily. 01/27/22   Vennie Homans, MD  ibuprofen (ADVIL) 600 MG tablet Take 1 tablet (600 mg total) by mouth every 6 (six) hours. 01/26/22   Vennie Homans, MD  lamoTRIgine (LAMICTAL) 25 MG tablet Take 1 tablet (25 mg total) by mouth daily. 01/26/22 02/25/22  Vennie Homans, MD  levETIRAcetam (KEPPRA) 750 MG tablet Take 2 tablets (1,500  mg total) by mouth 2 (two) times daily. 11/22/21 02/20/22  Janyth Pupa, DO  magnesium oxide (MAG-OX) 400 MG tablet Take 400 mg by mouth daily.    [provider]  Prenatal Vit-Fe Fumarate-FA (PRENATAL VITAMIN PO) Take 1 capsule by mouth daily.    [provider]  albuterol (PROVENTIL HFA;VENTOLIN HFA) 108 (90 Base) MCG/ACT inhaler Inhale 2 puffs into the lungs every 6 (six) hours as needed for wheezing or shortness of breath. 04/05/18 11/28/19  Roma Schanz, CNM  metoCLOPramide (REGLAN) 10 MG tablet Take 1 tablet (10 mg total) by mouth every 6 (six) hours as needed for nausea. 07/19/19 11/28/19  Cresenzo-Dishmon, Joaquim Lai, CNM      Allergies    Bee venom, Peach [prunus persica], Penicillins, Ativan [lorazepam], Prednisone, and Latex    Review of Systems   Review of Systems  Physical Exam Updated Vital Signs BP (!) 124/98 (BP Location: Right Arm)   Pulse 78   Temp 99 F (37.2 C) (Oral)   Resp 16   SpO2 97%  Physical Exam Vitals and nursing note reviewed.  Constitutional:      General: She is in acute distress.     Appearance: She is well-developed. She is obese. She is not ill-appearing or diaphoretic.  HENT:     Head: Normocephalic and atraumatic.     Right Ear: External ear normal.  Left Ear: External ear normal.  Eyes:     Conjunctiva/sclera: Conjunctivae normal.     Pupils: Pupils are equal, round, and reactive to light.  Neck:     Trachea: Phonation normal.  Cardiovascular:     Rate and Rhythm: Normal rate and regular rhythm.     Heart sounds: Normal heart sounds.  Pulmonary:     Effort: Pulmonary effort is normal. No respiratory distress.     Breath sounds: Normal breath sounds. No stridor.  Abdominal:     General: There is no distension.     Palpations: Abdomen is soft.     Tenderness: There is no abdominal tenderness.  Musculoskeletal:        General: Normal range of motion.     Cervical back: Normal range of motion and neck supple.   Skin:    General: Skin is warm and dry.  Neurological:     Cranial Nerves: No cranial nerve deficit.     Motor: Abnormal muscle tone (Flaccid arms and legs bilaterally) present.     Comments: Patient tightly clenching jaw shot, not grinding teeth.  Pupils are reactive, and some eye motion is noted but it does not appear purposeful.  There is no nystagmus.  Psychiatric:     Comments: Nonverbal    ED Results / Procedures / Treatments   Labs (all labs ordered are listed, but only abnormal results are displayed) Labs Reviewed  CBG MONITORING, ED - Abnormal; Notable for the following components:      Result Value   Glucose-Capillary 101 (*)    All other components within normal limits  BASIC METABOLIC PANEL  CBC  I-STAT BETA HCG BLOOD, ED ([facility], [facility], [facility] ONLY)    EKG None  Radiology No results found.  Procedures Procedures  {Document cardiac monitor, telemetry assessment procedure when appropriate:1}  Medications Ordered in ED Medications - No data to display  ED Course/ Medical Decision Making/ A&P Clinical Course as of 04/19/22 2210  Sun Apr 19, 2022  2207 Patient apparently woke up about 12 minutes ago, and started talking, which was shortly after I saw her when she was not responsive. [EW]  2208 At this time the patient is alert, lucid and cooperative.  At this time there is no trismus, she opens her mouth easily and there is no injury to the tongue.  She states she had a miscarriage on May 2, following her pregnancy and delivery on January 23, 2022. [EW]  2208 Patient denies recent illnesses including fever, vomiting, dysuria, diarrhea or headaches.  She states she is taking her usual prescribed medicine. [EW]    Clinical Course User Index [EW] Daleen Bo, MD                           Medical Decision Making Patient presenting with unusual neurologic status, clenched jaw, flaccid arms and legs.  No respiratory distress.  Normal vital signs.  No visualized seizure  activity.  She apparently had 3 episodes, visualized initially by boyfriend, then EMS, then staff in the ED.  I did not see any shaking or evident seizure activity.  Patient takes Keppra and Lamictal.  She apparently had a miscarriage sometime in the last couple of months, according to the boyfriend who is with her.  Documentation in the EMR indicates that she delivered a child, in March 2023 that had a congenital heart defect.  Since then she has been seeing a Catering manager for  unspecified psychiatric illness.  Amount and/or Complexity of Data Reviewed Independent Historian:     Details: Initially patient could not give history that after she awoke she was able to give history Labs: ordered.     {Document critical care time when appropriate:1} {Document review of labs and clinical decision tools ie heart score, Chads2Vasc2 etc:1}  {Document your independent review of radiology images, and any outside records:1} {Document your discussion with family members, caretakers, and with consultants:1} {Document social determinants of health affecting pt's care:1} {Document your decision making why or why not admission, treatments were needed:1} Final Clinical Impression(s) / ED Diagnoses Final diagnoses:  None    Rx / DC Orders ED Discharge Orders     None

## 2022-04-19 NOTE — Discharge Instructions (Signed)
Take all of your medicine as prescribed.  We sent a prescription to treat a urinary tract infection to your pharmacy.  Make sure you are drinking plenty of fluids and eating a regular diet.  Follow-up with your primary care doctor or neurologist for checkup in a week or 2.  Return here if needed.

## 2022-04-22 LAB — URINE CULTURE: Culture: >=100000 — AB

## 2022-04-23 ENCOUNTER — Telehealth: Payer: Self-pay

## 2022-04-23 NOTE — Progress Notes (Signed)
ED Antimicrobial Stewardship Positive Culture Follow Up   Erin Krueger is an 26 y.o. female who presented to [facility] on 04/19/2022 with a chief complaint of  Chief Complaint  Patient presents with   Seizures    Recent Results (from the past 720 hour(s))  Urine Culture     Status: Abnormal   Collection Time: 04/19/22 10:47 PM   Specimen: Urine, Catheterized  Result Value Ref Range Status   Specimen Description   Final    URINE, CATHETERIZED Performed at [facility] [address], [place], [place] [address]    Special Requests   Final    NONE Performed at [facility], [place] [address], [place], [place] [address]    Culture >=100,000 COLONIES/mL PROTEUS MIRABILIS (A)  Final   Report Status 04/22/2022 FINAL  Final   Organism ID, Bacteria PROTEUS MIRABILIS (A)  Final      Susceptibility   Proteus mirabilis - MIC*    AMPICILLIN <=2 SENSITIVE Sensitive     CEFAZOLIN <=4 SENSITIVE Sensitive     CEFEPIME <=0.12 SENSITIVE Sensitive     CEFTRIAXONE <=0.25 SENSITIVE Sensitive     CIPROFLOXACIN <=0.25 SENSITIVE Sensitive     GENTAMICIN <=1 SENSITIVE Sensitive     IMIPENEM 2 SENSITIVE Sensitive     NITROFURANTOIN 256 RESISTANT Resistant     TRIMETH/SULFA >=320 RESISTANT Resistant     AMPICILLIN/SULBACTAM <=2 SENSITIVE Sensitive     PIP/TAZO <=4 SENSITIVE Sensitive     * >=100,000 COLONIES/mL PROTEUS MIRABILIS    [x]  Treated with nitrofurantoin, organism resistant to prescribed antimicrobial []  Patient discharged originally without antimicrobial agent and treatment is now indicated  Plan: - Discontinue nitrofurantoin  - Call patient for symptom check - If no urinary symptoms, no further treatment is needed - If patient has urinary symptoms, prescribe cephalexin 500mg  BID x7 days   ED Provider: Charlesetta Shanks, MD  Donald Pore 04/23/2022, 9:27 AM Clinical Pharmacist Monday - Friday phone -  [phone] Saturday - Sunday phone  - [phone]

## 2022-04-23 NOTE — Telephone Encounter (Signed)
Post ED Visit - Positive Culture Follow-up: Unsuccessful Patient Follow-up  Culture assessed and recommendations reviewed by:  []  , Pharm.D. []  Enzo Bi, Pharm.D., BCPS AQ-ID []  , Pharm.D., BCPS []  Celedonio Miyamoto, Pharm.D., BCPS []  [place], Garvin Fila.D., BCPS, AAHIVP []  , Pharm.D., BCPS, AAHIVP []  Georgina Pillion, PharmD []  , PharmD, BCPS  Positive urine culture  []  Patient discharged without antimicrobial prescription and treatment is now indicated [x]  Organism is resistant to prescribed ED discharge antimicrobial []  Patient with positive blood cultures   Plan: call pt if no UTI symptoms stop abx. if having symptoms prescribe Cephalexin 500 mg BID x 7 days per [place], MD  Unable to contact patient after multiple attempts, letter will be sent to address on file  [address] 04/23/2022, 10:21 AM

## 2022-05-16 ENCOUNTER — Encounter (HOSPITAL_COMMUNITY): Payer: Self-pay | Admitting: *Deleted

## 2022-05-16 ENCOUNTER — Emergency Department (HOSPITAL_COMMUNITY)
Admission: EM | Admit: 2022-05-16 | Discharge: 2022-05-17 | Discharge status: Against Medical Advice | Payer: Medicaid Other | Attending: Emergency Medicine | Admitting: Emergency Medicine

## 2022-05-16 ENCOUNTER — Other Ambulatory Visit: Payer: Self-pay

## 2022-05-16 DIAGNOSIS — O209 Hemorrhage in early pregnancy, unspecified: Secondary | ICD-10-CM | POA: Insufficient documentation

## 2022-05-16 DIAGNOSIS — Z91040 Latex allergy status: Secondary | ICD-10-CM | POA: Diagnosis not present

## 2022-05-16 DIAGNOSIS — O4690 Antepartum hemorrhage, unspecified, unspecified trimester: Secondary | ICD-10-CM

## 2022-05-16 LAB — CBC WITH DIFFERENTIAL/PLATELET
Abs Immature Granulocytes: 0.01 10*3/uL (ref 0.00–0.07)
Basophils Absolute: 0 10*3/uL (ref 0.0–0.1)
Basophils Relative: 0 %
Eosinophils Absolute: 0.3 10*3/uL (ref 0.0–0.5)
Eosinophils Relative: 4 %
HCT: 39.9 % (ref 36.0–46.0)
Hemoglobin: 13.1 g/dL (ref 12.0–15.0)
Immature Granulocytes: 0 %
Lymphocytes Relative: 41 %
Lymphs Abs: 3.3 10*3/uL (ref 0.7–4.0)
MCH: 28.5 pg (ref 26.0–34.0)
MCHC: 32.8 g/dL (ref 30.0–36.0)
MCV: 86.7 fL (ref 80.0–100.0)
Monocytes Absolute: 0.7 10*3/uL (ref 0.1–1.0)
Monocytes Relative: 8 %
Neutro Abs: 3.8 10*3/uL (ref 1.7–7.7)
Neutrophils Relative %: 47 %
Platelets: 238 10*3/uL (ref 150–400)
RBC: 4.6 MIL/uL (ref 3.87–5.11)
RDW: 14.5 % (ref 11.5–15.5)
WBC: 8 10*3/uL (ref 4.0–10.5)
nRBC: 0 % (ref 0.0–0.2)

## 2022-05-16 LAB — HCG, QUANTITATIVE, PREGNANCY: hCG, Beta Chain, Quant, S: 75 m[IU]/mL — ABNORMAL HIGH (ref <5)

## 2022-05-16 MED ORDER — ACETAMINOPHEN 325 MG PO TABS
650.0000 mg | ORAL_TABLET | Freq: Once | ORAL | Status: AC
  Administered 2022-05-16: 650 mg via ORAL
  Filled 2022-05-16: qty 2

## 2022-05-17 ENCOUNTER — Emergency Department (HOSPITAL_COMMUNITY): Payer: Medicaid Other

## 2022-05-17 NOTE — ED Provider Notes (Signed)
[facility] EMERGENCY DEPARTMENT Provider Note   CSN: [id number] Arrival date & time: 05/16/22  2058     History  Chief Complaint  Patient presents with   Vaginal Bleeding    Erin Krueger is a 26 y.o. female.  The history is provided by the patient.  Vaginal Bleeding Quality:  Bright red Severity:  Mild Onset quality:  Sudden Chronicity:  New Worsened by:  Nothing Associated symptoms: no fever    Patient reports that she is having vaginal bleeding.  Patient reports she was recently found to be pregnant.  Last menstrual cycle May 19 She reports earlier tonight she was moving furniture down steps when she fell and hit her abdomen on a table.  She then went down about 3 steps on her abdomen.  No head injury or LOC She now has mild abd pain and vag bleeding No h/o ectopic pregnancy She is a G5P4   Recently seen in outside ER for seizure, pt with known h/o epilepsy    Home Medications Prior to Admission medications   Medication Sig Start Date End Date Taking? Authorizing Provider  acetaminophen (TYLENOL) 500 MG tablet Take 2 tablets (1,000 mg total) by mouth every 8 (eight) hours as needed (for pain). 01/26/22   Myrna Blazer, MD  lamoTRIgine (LAMICTAL) 25 MG tablet Take 1 tablet (25 mg total) by mouth daily. 01/26/22 02/25/22  Myrna Blazer, MD  levETIRAcetam (KEPPRA) 750 MG tablet Take 2 tablets (1,500 mg total) by mouth 2 (two) times daily. 11/22/21 02/20/22  Myna Hidalgo, DO  magnesium oxide (MAG-OX) 400 MG tablet Take 400 mg by mouth daily.    [provider]  Prenatal Vit-Fe Fumarate-FA (PRENATAL VITAMIN PO) Take 1 capsule by mouth daily.    [provider]  albuterol (PROVENTIL HFA;VENTOLIN HFA) 108 (90 Base) MCG/ACT inhaler Inhale 2 puffs into the lungs every 6 (six) hours as needed for wheezing or shortness of breath. 04/05/18 11/28/19  Cheral Marker, CNM  metoCLOPramide (REGLAN) 10 MG tablet Take 1 tablet (10 mg total) by mouth every  6 (six) hours as needed for nausea. 07/19/19 11/28/19  Cresenzo-Dishmon, Scarlette Calico, CNM      Allergies    Bee venom, Penicillins, Ativan [lorazepam], Prednisone, and Latex    Review of Systems   Review of Systems  Constitutional:  Negative for fever.  Gastrointestinal:  Negative for vomiting.  Genitourinary:  Positive for vaginal bleeding.    Physical Exam Updated Vital Signs BP (!) 113/92 (BP Location: Right Arm)   Pulse 87   Temp 98.1 F (36.7 C) (Oral)   Resp 18   Wt 86.5 kg   LMP 04/10/2022   SpO2 100%   BMI 30.76 kg/m  Physical Exam CONSTITUTIONAL: Well developed/well nourished HEAD: Normocephalic/atraumatic, no visible trauma EYES: EOMI/PERRL ENMT: Mucous membranes moist NECK: supple no meningeal signs SPINE/BACK:entire spine nontender,No bruising/crepitance/stepoffs noted to spine CV: S1/S2 noted, no murmurs/rubs/gallops noted LUNGS: Lungs are clear to auscultation bilaterally, no apparent distress ABDOMEN: soft, mild lower abdominal tenderness, no bruising, no rebound or guarding, bowel sounds noted throughout abdomen GU:no cva tenderness Patient refuses pelvic exam NEURO: Pt is awake/alert/appropriate, moves all extremitiesx4.  No facial droop.  GCS 15 EXTREMITIES: pulses normal/equal, full ROM SKIN: warm, color normal PSYCH: no abnormalities of mood noted, alert and oriented to situation  ED Results / Procedures / Treatments   Labs (all labs ordered are listed, but only abnormal results are displayed) Labs Reviewed  HCG, QUANTITATIVE, PREGNANCY - Abnormal; Notable for  the following components:      Result Value   hCG, Beta Chain, Quant, S 75 (*)    All other components within normal limits  CBC WITH DIFFERENTIAL/PLATELET    EKG None  Radiology US OB LESS THAN 14 WEEKS WITH OB TRANSVAGINAL  Result Date: 05/17/2022 CLINICAL DATA:  Spotting and known positive pregnancy test EXAM: OBSTETRIC <14 WK [place] AND TRANSVAGINAL OB US TECHNIQUE: Both transabdominal and  transvaginal ultrasound examinations were performed for complete evaluation of the gestation as well as the maternal uterus, adnexal regions, and pelvic cul-de-sac. Transvaginal technique was performed to assess early pregnancy. COMPARISON:  None Available. FINDINGS: Intrauterine gestational sac: Present Yolk sac:  Absent Embryo:  Absent MSD: 6.9 mm mm   5 w   3 d Subchorionic hemorrhage:  None visualized. Maternal uterus/adnexae: 19 mm cyst is noted within the right ovary. IMPRESSION: Probable early intrauterine gestational sac, but no yolk sac, fetal pole, or cardiac activity yet visualized. Recommend follow-up quantitative B-HCG levels and follow-up US in 14 days to assess viability. This recommendation follows SRU consensus guidelines: Diagnostic Criteria for Nonviable Pregnancy Early in the First Trimester. Malva Limes Med 2013; 188:4166-06. Electronically Signed   By: Alcide Clever M.D.   On: 05/17/2022 01:08    Procedures Procedures    Medications Ordered in ED Medications  acetaminophen (TYLENOL) tablet 650 mg (650 mg Oral Given 05/16/22 2345)    ED Course/ Medical Decision Making/ A&P Clinical Course as of 05/17/22 0118  Sun May 17, 2022  0033 PATIENT REFUSES PELVIC EXAM [DW]  0035 Patient is O+ [DW]  0117 I was informed by nursing staff the patient decided to leave against advice after ultrasound imaging was completed, I was unable to give her the results [DW]    Clinical Course User Index [DW] Zadie Rhine, MD                           Medical Decision Making Amount and/or Complexity of Data Reviewed Labs: ordered. Radiology: ordered.  Risk OTC drugs.   This patient presents to the ED for concern of vaginal bleeding, this involves an extensive number of treatment options, and is a complaint that carries with it a high risk of complications and morbidity.  The differential diagnosis includes but is not limited to ectopic pregnancy, miscarriage   Comorbidities that complicate  the patient evaluation: Patient's presentation is complicated by their history of epilepsy   Additional history obtained: Additional history obtained from significant other  Lab Tests: I Ordered, and personally interpreted labs.  The pertinent results include:  patient is pregnant  Imaging Studies ordered: I ordered imaging studies including Ultrasound pelvic   I independently visualized and interpreted imaging which showed probable early IUP I agree with the radiologist interpretation   Medicines ordered and prescription drug management: I ordered medication including tylenol  for pain   Complexity of problems addressed: Patient's presentation is most consistent with  acute presentation with potential threat to life or bodily function  Disposition: After consideration of the diagnostic results and the patient's response to treatment,  I feel that the patent would benefit from discharge   .           Final Clinical Impression(s) / ED Diagnoses Final diagnoses:  Vaginal bleeding in pregnancy    Rx / DC Orders ED Discharge Orders     None         Zadie Rhine, MD 05/17/22 [phone]

## 2022-05-18 ENCOUNTER — Ambulatory Visit (INDEPENDENT_AMBULATORY_CARE_PROVIDER_SITE_OTHER): Payer: Medicaid Other

## 2022-05-18 ENCOUNTER — Other Ambulatory Visit: Payer: Self-pay

## 2022-05-18 VITALS — BP 110/84 | HR 80 | Wt 188.7 lb

## 2022-05-18 DIAGNOSIS — Z3200 Encounter for pregnancy test, result unknown: Secondary | ICD-10-CM

## 2022-05-18 DIAGNOSIS — O3680X0 Pregnancy with inconclusive fetal viability, not applicable or unspecified: Secondary | ICD-10-CM

## 2022-05-18 LAB — BETA HCG QUANT (REF LAB): hCG Quant: 202 m[IU]/mL

## 2022-05-18 NOTE — Progress Notes (Signed)
Possible Pregnancy  Patient presents to office stating she needs follow up from ED visit at [facility] on 05/16/22. Per chart review this was for vaginal bleeding and abdominal pain in early pregnancy. Probable early intrauterine gestational sac seen on ultrasound at ED visit. Patient denies any vaginal bleeding or pain today. Explained we are repeating beta HCG level for comparison. Reviewed possibility of miscarriage vs normal early pregnancy; explained we cannot completely rule out ectopic pregnancy at this time. Reviewed return precautions.   Beta HCG results: 05/16/22 2156 75  05/18/22 1423 202   Results and patient history reviewed with Crissie Reese, MD who states this is an appropriate rise in beta HCG; however, imaging is not consistent with beta HCG level. Recommends repeat beta HCG 05/20/22. Called pt; no answer and no option for VM. MyChart message sent.  Marjo Bicker, RN 05/18/2022  1:48 PM

## 2022-05-20 ENCOUNTER — Ambulatory Visit (INDEPENDENT_AMBULATORY_CARE_PROVIDER_SITE_OTHER): Payer: Medicaid Other

## 2022-05-20 ENCOUNTER — Other Ambulatory Visit: Payer: Self-pay

## 2022-05-20 VITALS — BP 141/95 | HR 104 | Wt 187.5 lb

## 2022-05-20 DIAGNOSIS — O3680X0 Pregnancy with inconclusive fetal viability, not applicable or unspecified: Secondary | ICD-10-CM

## 2022-05-20 LAB — BETA HCG QUANT (REF LAB): hCG Quant: 633 m[IU]/mL

## 2022-05-20 NOTE — Progress Notes (Signed)
Beta HCG Follow-up Visit  Erin Krueger presents to [facility] for follow-up beta HCG lab. She was seen at [facility] ED for vaginal bleeding and abdominal pain on 05/16/22. Patient states she fell while moving furniture on 05/16/22. Patient denies pain, bleeding today. Discussed with patient that we are following beta HCG levels today. Results will be back in approximately 2-4 hours. Valid contact number for patient confirmed. I explained to patient that results may come back after clinic closes and that she will still be notified either tonight or tomorrow morning of results. Patient verbalized understanding.  Patient has elevated blood pressure today at 135/93. Blood pressure recheck approximately 10 minutes later was 141/95. Reviewed with Dr. Crissie Reese. Per Dr. Crissie Reese patient is early in pregnancy- we will recheck her BP at next appointment. I reviewed this with patient. Patient verbalized understanding.   Beta HCG results: 05/16/22  HCG 75  05/18/22  HCG 202  05/20/22  HCG pending     Cline Crock 05/20/2022

## 2022-05-21 ENCOUNTER — Telehealth: Payer: Self-pay

## 2022-05-21 DIAGNOSIS — O3680X0 Pregnancy with inconclusive fetal viability, not applicable or unspecified: Secondary | ICD-10-CM

## 2022-05-21 NOTE — Telephone Encounter (Signed)
Per Anyanwu, MD pt had appropriate rise in beta HCG (see encounter 05/20/22) and should be scheduled for viability [place] in 10-14 days. Called pt. Reviewed results. [place] scheduled for 06/02/22 at 1100.

## 2022-06-02 ENCOUNTER — Ambulatory Visit (INDEPENDENT_AMBULATORY_CARE_PROVIDER_SITE_OTHER): Payer: Medicaid Other

## 2022-06-02 VITALS — BP 99/67 | HR 75 | Temp 98.0°F

## 2022-06-02 DIAGNOSIS — Z3A01 Less than 8 weeks gestation of pregnancy: Secondary | ICD-10-CM

## 2022-06-02 DIAGNOSIS — O3680X0 Pregnancy with inconclusive fetal viability, not applicable or unspecified: Secondary | ICD-10-CM | POA: Diagnosis not present

## 2022-06-02 DIAGNOSIS — Z3491 Encounter for supervision of normal pregnancy, unspecified, first trimester: Secondary | ICD-10-CM

## 2022-06-02 NOTE — Progress Notes (Signed)
GYNECOLOGY OFFICE VISIT NOTE  History:   Erin Krueger is a 26 y.o. [phone] here today for ultrasound results.  Ultrasound shows live IUP measuring 6 weeks 1 day.  Abdominal pain: No   Vaginal bleeding: No    OB History  Gravida Para Term Preterm AB Living  8 5 3 2 2 4   SAB IAB Ectopic Multiple Live Births  2     0 4    # Outcome Date GA Lbr Len/2nd Weight Sex Delivery Anes PTL Lv  8 Current           7 Preterm 01/24/22 [redacted]w[redacted]d 14:40 / 00:12 6 lb 7.4 oz (2.93 kg) F Vag-Spont EPI  LIV  6 Preterm 12/28/19 [redacted]w[redacted]d 08:08 / 00:05 6 lb 2.4 oz (2.79 kg) M Vag-Spont EPI  LIV     Complications: Eclampsia  5 Term 11/08/18 [redacted]w[redacted]d 00:15 / 00:18 7 lb 3.5 oz (3.275 kg) M Vag-Spont EPI  LIV     Birth Comments: WNL  4 Term 06/23/17 [redacted]w[redacted]d  6 lb 8 oz (2.948 kg) F Vag-Spont EPI N LIV     Birth Comments: cardiac defect  3 SAB 2014          2 Term  [redacted]w[redacted]d       FD  1 SAB              There are no preventive care reminders to display for this patient.  Past Medical History:  Diagnosis Date   ADD (attention deficit disorder)    ADHD (attention deficit hyperactivity disorder)    Adopted person 10/04/2019   Does not know personal family history other than Diabetes, Breast Cancer. Raised in foster care   Allergy    Anxiety    Asthma    Eating disorder    Headache(784.0)    History of eclampsia    History of pre-eclampsia    Kidney stone    kidney stones   Migraines    ODD (oppositional defiant disorder)    PID (acute pelvic inflammatory disease) 01/29/2016   Pregnancy induced hypertension    PTSD (post-traumatic stress disorder)    PTSD (post-traumatic stress disorder)    Seizures (HCC)    diagnosed age 50    Past Surgical History:  Procedure Laterality Date   right knee surgery     TONSILLECTOMY AND ADENOIDECTOMY      The following portions of the patient's history were reviewed and updated as appropriate: allergies, current medications, past family history, past medical  history, past social history, past surgical history and problem list.   Review of Systems:  Pertinent items noted in HPI and remainder of comprehensive ROS otherwise negative.  Physical Exam:  BP 99/67   Pulse 75   Temp 98 F (36.7 C)   LMP 04/10/2022  CONSTITUTIONAL: Well-developed, well-nourished female in no acute distress.  HEENT:  Normocephalic, atraumatic. External right and left ear normal. No scleral icterus.  NECK: Normal range of motion, supple, no masses noted on observation SKIN: No rash noted. Not diaphoretic. No erythema. No pallor. MUSCULOSKELETAL: Normal range of motion. No edema noted. NEUROLOGIC: Alert and oriented to person, place, and time. Normal muscle tone coordination.  PSYCHIATRIC: Normal mood and affect. Normal behavior. Normal judgment and thought content. RESPIRATORY: Effort normal, no problems with respiration noted  Assessment and Plan:  Viable Pregnancy - Z34.90  Reviewed results with patient that show a viable intrauterine pregnancy at 6 weeks of gestation. Recommended she contact providers to start prenatal  care, and she was given a list of options in her AVS. Prescription sent for prenatal vitamins. Reviewed ED precautions including vaginal bleeding like a period or heavier, severe abdominal pain, and fever. All questions answered.   Please refer to After Visit Summary for other counseling recommendations.   Patient to follow up with [facility] for prenatal care. Patient will make appointment at front desk.   Total face-to-face time with patient: 20 minutes.  Over 50% of encounter was spent on counseling and coordination of care.   Rolm Bookbinder, CNM [facility] [employer], [facility]

## 2022-06-02 NOTE — Patient Instructions (Signed)

## 2022-06-10 ENCOUNTER — Telehealth (INDEPENDENT_AMBULATORY_CARE_PROVIDER_SITE_OTHER): Payer: Medicaid Other

## 2022-06-10 DIAGNOSIS — O0990 Supervision of high risk pregnancy, unspecified, unspecified trimester: Secondary | ICD-10-CM

## 2022-06-10 NOTE — Progress Notes (Signed)
Called pt @ 0818 and left message that I am calling in regards to her NEW OB intake appt.  I will call back in 15 minutes in which if I do not reach you I will have to request that you reschedule both appts.    Called pt @ [phone] and left message that this is our second attempt to try to reach you at this time I will have to request that you call the office to reschedule your NEW OB intake and provider appt.    Leonette Nutting  06/10/22

## 2022-06-17 ENCOUNTER — Telehealth: Payer: Medicaid Other | Admitting: *Deleted

## 2022-06-17 ENCOUNTER — Encounter: Payer: Self-pay | Admitting: *Deleted

## 2022-06-17 DIAGNOSIS — Z8759 Personal history of other complications of pregnancy, childbirth and the puerperium: Secondary | ICD-10-CM | POA: Insufficient documentation

## 2022-06-17 DIAGNOSIS — Z3A00 Weeks of gestation of pregnancy not specified: Secondary | ICD-10-CM

## 2022-06-17 DIAGNOSIS — O99210 Obesity complicating pregnancy, unspecified trimester: Secondary | ICD-10-CM

## 2022-06-17 DIAGNOSIS — O0990 Supervision of high risk pregnancy, unspecified, unspecified trimester: Secondary | ICD-10-CM | POA: Insufficient documentation

## 2022-06-17 DIAGNOSIS — O09899 Supervision of other high risk pregnancies, unspecified trimester: Secondary | ICD-10-CM

## 2022-06-17 DIAGNOSIS — O09299 Supervision of pregnancy with other poor reproductive or obstetric history, unspecified trimester: Secondary | ICD-10-CM | POA: Insufficient documentation

## 2022-06-17 NOTE — Patient Instructions (Signed)
  At our [facility], we work as an integrated team, providing care to address both physical and emotional health. Your medical provider may refer you to see our Behavioral Health Clinician (BHC) on the same day you see your medical provider, as availability permits; often scheduled virtually at your convenience.  Our BHC is available to all patients, visits generally last between 20-30 minutes, but can be longer or shorter, depending on patient need. The BHC offers help with stress management, coping with symptoms of depression and anxiety, major life changes , sleep issues, changing risky behavior, grief and loss, life stress, working on personal life goals, and  behavioral health issues, as these all affect your overall health and wellness.  The BHC is NOT available for the following: FMLA paperwork, court-ordered evaluations, specialty assessments (custody or disability), letters to employers, or obtaining certification for an emotional support animal. The BHC does not provide long-term therapy. You have the right to refuse integrated behavioral health services, or to reschedule to see the BHC at a later date.  Confidentiality exception: If it is suspected that a child or disabled adult is being abused or neglected, we are required by law to report that to either Child Protective Services or Adult Protective Services.  If you have a diagnosis of Bipolar affective disorder, Schizophrenia, or recurrent Major depressive disorder, we will recommend that you establish care with a psychiatrist, as these are lifelong, chronic conditions, and we want your overall emotional health and medications to be more closely monitored. If you anticipate needing extended maternity leave due to mental health issues postpartum, it it recommended you inform your medical provider, so we can put in a referral to a psychiatrist as soon as possible. The BHC is unable to recommend an extended maternity leave for mental  health issues. Your medical provider or BHC may refer you to a therapist for ongoing, traditional therapy, or to a psychiatrist, for medication management, if it would benefit your overall health. Depending on your insurance, you may have a copay or be charged a deductible, depending on your insurance, to see the BHC. If you are uninsured, it is recommended that you apply for financial assistance. (Forms may be requested at the front desk for in-person visits, via MyChart, or request a form during a virtual visit).  If you see the BHC more than 6 times, you will have to complete a comprehensive clinical assessment interview with the BHC to resume integrated services.  For virtual visits with the BHC, you must be physically in the state of [place] at the time of the visit. For example, if you live in [place], you will have to do an in-person visit with the BHC, and your out-of-state insurance may not cover behavioral health services in [place]. If you are going out of the state or country for any reason, the BHC may see you virtually when you return to [place], but not while you are physically outside of [place].    [email or web address] is a website with information to help you prepare for Labor and delivery, patient information, visitor information and more.    Here is a link to the Pregnancy Navigators . Please Fill out prior to your New OB appointment.   English Link: [email or web address]  Spanish Link: [email or web address]  

## 2022-06-17 NOTE — Progress Notes (Signed)
New OB Intake  I connected with  Erin Krueger on 06/17/22 at 9:41 by MyChart Video Visit and verified that I am speaking with the correct person using two identifiers. Nurse is located at [facility] and pt is located at home.  I discussed the limitations, risks, security and privacy concerns of performing an evaluation and management service by telephone and the availability of in person appointments. I also discussed with the patient that there may be a patient responsible charge related to this service. The patient expressed understanding and agreed to proceed.  I explained I am completing New OB Intake today. We discussed her EDD of 01/25/23 that is based on [place]. Pt is G9/P3234. I reviewed her allergies, medications, Medical/Surgical/OB history, and appropriate screenings. I informed her of [facility] services. [facility] information placed in AVS. Based on history, this is a/an  pregnancy complicated by history pre-eclampsia and eclampsia, grand multipara,history stillbirth  history seizures.   Patient Active Problem List   Diagnosis Date Noted   History of pre-eclampsia in prior pregnancy, currently pregnant 06/17/2022   History of eclampsia 06/17/2022   Short interval between pregnancies affecting pregnancy, antepartum 06/17/2022   History of stillbirth 06/17/2022   Obesity in pregnancy 06/17/2022   Supervision of high risk pregnancy, antepartum 06/17/2022   Unspecified mood (affective) disorder (HCC) 03/26/2022   IUD (intrauterine device) in place 01/26/2022   Seizure disorder during pregnancy, antepartum (HCC) 12/01/2021   Asthma 11/29/2018   H/o child with congenital heart defect 04/05/2018   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 Patient given information for [facility]  Healthy Baby website for more information about [facility]. Patient is interested in water birth. Offered upcoming OB visit with CNM to discuss further.  MyChart/Babyscripts MyChart access verified. I explained pt will have some visits in office and some virtually. Babyscripts instructions given and order placed.   Blood Pressure Cuff/Weight Scale Patient has a weight scale.  Explained after first prenatal appt pt will check weekly and document in Babyscripts. Patient does have weight scale.   Anatomy US Explained first scheduled [place] will be around 19 weeks. Anatomy US scheduled for 08/31/22 at 245. Pt notified to arrive at 230.  Labs Discussed Avelina Laine genetic screening with patient. Would like Panorama  drawn at new OB visit. Routine prenatal labs needed.  Covid Vaccine Patient has not had covid vaccine.   Is patient a CenteringPregnancy candidate?  Declined Declined due to Support Person Concern- because wants him every visit and also having 5 th kid  Is patient a Mom+Baby Combined Care candidate?  Not a candidate     Social Determinants of Health Food Insecurity: Patient denies food insecurity. WIC Referral: Patient is not interested in referral to [facility]. , already has wic Transportation: Patient denies transportation needs. Childcare: Discussed no children allowed at ultrasound appointments. Offered childcare services; patient declines childcare services at this time.  First visit review I reviewed new OB appt with pt. I explained she will have a provider visit that includes routine ob labs, panorama/ urine culture/ hemoglobin A1c. Explained pt will be seen by Dr. Alvester Morin at first visit; encounter routed to appropriate provider. Explained that patient will be seen by pregnancy navigator following visit with provider.   Nancy Fetter 06/17/2022  12:25 PM

## 2022-06-25 ENCOUNTER — Telehealth: Payer: Self-pay

## 2022-06-25 NOTE — Telephone Encounter (Addendum)
[place], Babyscripts  P [facility]-Babyscripts Htn Md; P [facility] Clinical Pool Babyscripts Trigger Notification for JAHARI, WIGINTON  Trigger Severity: Critical  Blood Pressure Value: 154/101  Reading Date: 2022-06-25  Symptoms: Headache, Nausea   Call placed to pt. Spoke with pt. Pt states doing better than this morning and having mild headache. Only took one tablet of 500 mg Tylenol at 1130am. Pt advised to take two 500 mg Tylenol now and make sure well hydrated. Pt also retook BP and was 149/99. Pt advised to put weekly BP into babyscripts app and also with symptoms. Pt verbalized understanding and agreeable to plan of care. Has next OB appt on 8/30 with Dr Alvester Morin.  Iridian Reader,RNC

## 2022-07-04 ENCOUNTER — Emergency Department (HOSPITAL_COMMUNITY)
Admission: EM | Admit: 2022-07-04 | Discharge: 2022-07-04 | Disposition: A | Discharge status: Home / Self Care | Payer: Medicaid Other | Attending: Emergency Medicine | Admitting: Emergency Medicine

## 2022-07-04 ENCOUNTER — Encounter: Payer: Self-pay | Admitting: Obstetrics and Gynecology

## 2022-07-04 ENCOUNTER — Other Ambulatory Visit: Payer: Self-pay

## 2022-07-04 ENCOUNTER — Encounter (HOSPITAL_COMMUNITY): Payer: Self-pay | Admitting: Emergency Medicine

## 2022-07-04 DIAGNOSIS — R569 Unspecified convulsions: Secondary | ICD-10-CM | POA: Diagnosis present

## 2022-07-04 DIAGNOSIS — Z91040 Latex allergy status: Secondary | ICD-10-CM | POA: Diagnosis not present

## 2022-07-04 LAB — CBG MONITORING, ED: Glucose-Capillary: 110 mg/dL — ABNORMAL HIGH (ref 70–99)

## 2022-07-04 MED ORDER — ACETAMINOPHEN 500 MG PO TABS
1000.0000 mg | ORAL_TABLET | Freq: Once | ORAL | Status: AC
  Administered 2022-07-04: 1000 mg via ORAL
  Filled 2022-07-04: qty 2

## 2022-07-04 NOTE — ED Provider Notes (Signed)
[facility] EMERGENCY DEPARTMENT Provider Note   CSN: [id number] Arrival date & time: 07/04/22  0000     History  Chief Complaint  Patient presents with   Seizures    Erin Krueger is a 26 y.o. female.  Approximately [redacted] weeks pregnant. Had been monitoring blood pressures at home and had a couple in the 140's systolic, planed to follow up for the same, however tonight had one >150 and was sent to the ER. On the way had reported seizure on the way. H/o same. Being transitioned from keppra to lamotrigine. Also h/o eclampsia.    Seizures      Home Medications Prior to Admission medications   Medication Sig Start Date End Date Taking? Authorizing Provider  lamoTRIgine (LAMICTAL) 25 MG tablet Take 1 tablet (25 mg total) by mouth daily. 01/26/22 07/04/22 Yes Myrna Blazer, MD  levETIRAcetam (KEPPRA) 750 MG tablet Take 1,500 mg by mouth 2 (two) times daily.   Yes [provider]  magnesium oxide (MAG-OX) 400 MG tablet Take 400 mg by mouth daily.   Yes [provider]  Prenatal Vit-Fe Fumarate-FA (PRENATAL VITAMIN PO) Take 1 capsule by mouth daily.   Yes [provider]  acetaminophen (TYLENOL) 500 MG tablet Take 2 tablets (1,000 mg total) by mouth every 8 (eight) hours as needed (for pain). 01/26/22   Myrna Blazer, MD  FOLIC ACID PO Take by mouth.    [provider]  levETIRAcetam (KEPPRA) 750 MG tablet Take 2 tablets (1,500 mg total) by mouth 2 (two) times daily. 11/22/21 05/20/22  Myna Hidalgo, DO  albuterol (PROVENTIL HFA;VENTOLIN HFA) 108 (90 Base) MCG/ACT inhaler Inhale 2 puffs into the lungs every 6 (six) hours as needed for wheezing or shortness of breath. 04/05/18 11/28/19  Cheral Marker, CNM  metoCLOPramide (REGLAN) 10 MG tablet Take 1 tablet (10 mg total) by mouth every 6 (six) hours as needed for nausea. 07/19/19 11/28/19  Cresenzo-Dishmon, Scarlette Calico, CNM      Allergies    Bee venom, Penicillins, Ativan [lorazepam], Prednisone,  and Latex    Review of Systems   Review of Systems  Neurological:  Positive for seizures.    Physical Exam Updated Vital Signs BP 102/68   Pulse 69   Temp 97.7 F (36.5 C) (Axillary)   Resp 19   Ht 5\' 6"  (1.676 m)   Wt 85 kg   LMP 04/10/2022   SpO2 97%   BMI 30.25 kg/m  Physical Exam Vitals and nursing note reviewed.  Constitutional:      Appearance: She is well-developed.  HENT:     Head: Normocephalic and atraumatic.     Mouth/Throat:     Mouth: Mucous membranes are moist.     Pharynx: Oropharynx is clear.  Eyes:     Pupils: Pupils are equal, round, and reactive to light.  Cardiovascular:     Rate and Rhythm: Normal rate and regular rhythm.  Pulmonary:     Effort: No respiratory distress.     Breath sounds: No stridor.  Abdominal:     General: Abdomen is flat. There is no distension.  Musculoskeletal:        General: No swelling or tenderness. Normal range of motion.     Cervical back: Normal range of motion.  Skin:    General: Skin is warm and dry.  Neurological:     General: No focal deficit present.     Mental Status: She is alert.     Comments: Responsive  but confused on arrival.      ED Results / Procedures / Treatments   Labs (all labs ordered are listed, but only abnormal results are displayed) Labs Reviewed  CBG MONITORING, ED - Abnormal; Notable for the following components:      Result Value   Glucose-Capillary 110 (*)    All other components within normal limits    EKG None  Radiology No results found.  Procedures Procedures    Medications Ordered in ED Medications  acetaminophen (TYLENOL) tablet 1,000 mg (1,000 mg Oral Given 07/04/22 0351)    ED Course/ Medical Decision Making/ A&P                           Medical Decision Making Risk OTC drugs.   Improved mental status. VSS here including BP's which are all in the low 100's. Early in pregnancy, doubt preeclampsia. Seizure may be from transitioning off the keppra.  Observed for a few hours without recurrent symptoms. Tolerating PO. Ambulating. Can follow up with outpatient team for same.    Final Clinical Impression(s) / ED Diagnoses Final diagnoses:  None    Rx / DC Orders ED Discharge Orders     None         Swathi Dauphin, Barbara Cower, MD 07/04/22 [phone]

## 2022-07-04 NOTE — ED Triage Notes (Signed)
Pt brought in by husband after he states her BP was high at home (153/70) and they called the nurse line and were told to come in for evaluation as pt is [redacted] weeks pregnant. As they were pulling in to parking lot, pt had a seizure lasting approximately 2 mins. Per husband, pt with hx of seizures.

## 2022-07-06 ENCOUNTER — Telehealth: Payer: Self-pay

## 2022-07-06 NOTE — Telephone Encounter (Addendum)
-----   Message from Milas Hock, MD sent at 07/04/2022  8:42 AM EDT ----- Regarding: Blood pressure cuff Can you bring her in for a BP check but she NEEDS TO BRING IN HER CUFF.   She went to the ER today and her BP there was 102/58 but her home BP cuff says 153/70.   Thanks!  Pad  -----------------------------------------------------------------------------------------------------------------------------  Called pt; VM left. MyChart message sent.

## 2022-07-07 ENCOUNTER — Encounter: Payer: Self-pay | Admitting: Neurology

## 2022-07-07 ENCOUNTER — Ambulatory Visit: Payer: Medicaid Other | Admitting: Neurology

## 2022-07-07 VITALS — BP 117/75 | HR 101 | Ht 66.0 in | Wt 190.0 lb

## 2022-07-07 DIAGNOSIS — G40909 Epilepsy, unspecified, not intractable, without status epilepticus: Secondary | ICD-10-CM | POA: Diagnosis not present

## 2022-07-07 DIAGNOSIS — Z3A11 11 weeks gestation of pregnancy: Secondary | ICD-10-CM

## 2022-07-07 DIAGNOSIS — Z5181 Encounter for therapeutic drug level monitoring: Secondary | ICD-10-CM

## 2022-07-07 MED ORDER — LAMOTRIGINE 25 MG PO TABS: ORAL_TABLET | ORAL | 0 refills | Status: DC

## 2022-07-07 NOTE — Patient Instructions (Addendum)
Continue with Keppra 1500 mg BID  Increase Lamictal to 25 mg twice daily for one week  Then increase to 50 mg (two pills) twice daily  Then increase 75 mg (three pills) twice daily Then increase to 100 mg (fours pills) twice daily Follow up 5 weeks for labs  Routine EEG  Follow up in 3 months

## 2022-07-07 NOTE — Progress Notes (Signed)
[facility]  PATIENT: Erin Krueger DOB: Oct 12, 1996  REQUESTING CLINICIAN: Aletha Halim, MD HISTORY FROM: Patient  REASON FOR VISIT: Epilepsy    HISTORICAL  CHIEF COMPLAINT:  Chief Complaint  Patient presents with   New Patient (Initial Visit)    Rm 13. Accompanied by husband. NP/Internal referral for seizures.    HISTORY OF PRESENT ILLNESS:  This is a 26 year old woman with past medical history of asthma and seizure disorder who is presenting for follow-up.  She reported being diagnosed with seizure at the age of 41 but having her first seizure 4 months.  She has been on multiple medication including Dilantin, Depakote, and currently on Keppra and Lamictal.  She reported her seizures have never been controlled.  She had different type of seizure including staring spells, episode of tensing up and an episode of full body shaking.  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  Patient reported she is currently [redacted] weeks pregnant, and her last seizure was 2 weeks ago, described as tensing up during her sleep.  No tongue biting, no urinary incontinence, at one time, she had a fall and had a concussion.  She had work-up in the past including EEG and MRI which were negative.    Handedness: Right handed   Onset: At the age of 7  Seizure Type: Staring spells, episode of whole body shaking, and other episode of full body tensing up  Current frequency: 1 to 2 seizure every 2 weeks   Any injuries from seizures: Concussion   Seizure risk factors: Family history of seizures, grandmother and mother   Previous ASMs: Dilantin, Depakote  Currenty ASMs: Keppra and Lamictal (but only 25 mg)   ASMs side effects: Denies   Brain Images: Normal MRI   Previous EEGs: Normal EEG    OTHER MEDICAL CONDITIONS: Asthma   REVIEW OF SYSTEMS: Full 14 system review of systems performed and negative with exception of: as noted in the HPI    ALLERGIES: Allergies  Allergen Reactions   Bee Venom Anaphylaxis   Cashew Nut Oil Anaphylaxis   Penicillins Anaphylaxis    * Has tolerated several cephalosporins* Has patient had a PCN reaction causing immediate rash, facial/tongue/throat swelling, SOB or lightheadedness with hypotension: Unknown Has patient had a PCN reaction causing severe rash involving mucus membranes or skin necrosis: Unknown Has patient had a PCN reaction that required hospitalization: Unknown Has patient had a PCN reaction occurring within the last 10 years: Unknown If all of the above answers are "NO", then may proceed with Cephalosporin use.    Ativan [Lorazepam] Other (See Comments)    Aggressive-agitation   Other    Prednisone Other (See Comments)    Bleeding nose bleeding and internal bleeding   Latex Rash    HOME MEDICATIONS: Outpatient Medications Prior to Visit  Medication Sig Dispense Refill   acetaminophen (TYLENOL) 500 MG tablet Take 2 tablets (1,000 mg total) by mouth every 8 (eight) hours as needed (for pain). 60 tablet 0   FOLIC ACID PO Take by mouth.     levETIRAcetam (KEPPRA) 750 MG tablet Take 1,500 mg by mouth 2 (two) times daily.     magnesium oxide (MAG-OX) 400 MG tablet Take 400 mg by mouth daily.     Prenatal Vit-Fe Fumarate-FA (PRENATAL VITAMIN PO) Take 1 capsule by mouth daily.     lamoTRIgine (LAMICTAL) 25 MG tablet Take 1 tablet (25 mg total) by mouth daily. 30 tablet 0   levETIRAcetam (KEPPRA)  750 MG tablet Take 2 tablets (1,500 mg total) by mouth 2 (two) times daily. 360 tablet 4   No facility-administered medications prior to visit.    PAST MEDICAL HISTORY: Past Medical History:  Diagnosis Date   ADD (attention deficit disorder)    ADHD (attention deficit hyperactivity disorder)    Adopted person 10/04/2019   Does not know personal family history other than Diabetes, Breast Cancer. Raised in foster care   Allergy    Anxiety    Asthma    Eating disorder     Headache(784.0)    History of eclampsia    History of pre-eclampsia    Kidney stone    kidney stones   Migraines    ODD (oppositional defiant disorder)    PID (acute pelvic inflammatory disease) 01/29/2016   Pregnancy induced hypertension    PTSD (post-traumatic stress disorder)    PTSD (post-traumatic stress disorder)    Seizures ([place])    diagnosed age 71    PAST SURGICAL HISTORY: Past Surgical History:  Procedure Laterality Date   DILATION AND CURETTAGE OF UTERUS     right knee surgery     TONSILLECTOMY AND ADENOIDECTOMY      FAMILY HISTORY: Family History  Adopted: Yes  Problem Relation Age of Onset   Diabetes Sister    Other Daughter        born with hole in heart   Other Son        born with hole in heart   Breast cancer Maternal Grandmother    Diabetes Maternal Grandmother    Glaucoma Maternal Grandmother     SOCIAL HISTORY: Social History   Socioeconomic History   Marital status: Married    Spouse name: Not on file   Number of children: 2   Years of education: Not on file   Highest education level: Not on file  Occupational History   Occupation: disabled has epilepsy   Tobacco Use   Smoking status: Former    Packs/day: 0.50    Types: Cigarettes, E-cigarettes    Quit date: 04/30/2021    Years since quitting: 1.1   Smokeless tobacco: Never   Tobacco comments:    nicotine patch  Vaping Use   Vaping Use: Former   Substances: Flavoring  Substance and Sexual Activity   Alcohol use: Not Currently    Comment: drinks beer on weekends when not pregnant   Drug use: Not Currently    Types: Marijuana, Cocaine    Comment: Not since 2018   Sexual activity: Yes    Birth control/protection: None  Other Topics Concern   Not on file  Social History Narrative   Not on file   Social Determinants of Health   Financial Resource Strain: Low Risk  (08/12/2021)   Overall Financial Resource Strain (CARDIA)    Difficulty of Paying Living Expenses: Not hard at all   Food Insecurity: No Food Insecurity (06/17/2022)   Hunger Vital Sign    Worried About Running Out of Food in the Last Year: Never true    [place] in the Last Year: Never true  Transportation Needs: No Transportation Needs (06/17/2022)   PRAPARE - Hydrologist (Medical): No    Lack of Transportation (Non-Medical): No  Physical Activity: Sufficiently Active (08/12/2021)   Exercise Vital Sign    Days of Exercise per Week: 4 days    Minutes of Exercise per Session: 60 min  Stress: No Stress Concern Present (08/12/2021)  [employer] of Occupational Health - Occupational Stress Questionnaire    Feeling of Stress : Not at all  Social Connections: Moderately Integrated (08/12/2021)   Social Connection and Isolation Panel [NHANES]    Frequency of Communication with Friends and Family: Three times a week    Frequency of Social Gatherings with Friends and Family: More than three times a week    Attends Religious Services: 1 to 4 times per year    Active Member of [employer] or Organizations: No    Attends Banker Meetings: Never    Marital Status: Living with partner  Intimate Partner Violence: Not At Risk (08/12/2021)   Humiliation, Afraid, Rape, and Kick questionnaire    Fear of Current or Ex-Partner: No    Emotionally Abused: No    Physically Abused: No    Sexually Abused: No    PHYSICAL EXAM  GENERAL EXAM/CONSTITUTIONAL: Vitals:  Vitals:   07/07/22 0804  BP: 117/75  Pulse: (!) 101  Weight: 190 lb (86.2 kg)  Height: 5\' 6"  (1.676 m)   Body mass index is 30.67 kg/m. Wt Readings from Last 3 Encounters:  07/07/22 190 lb (86.2 kg)  07/04/22 187 lb 6.3 oz (85 kg)  05/20/22 187 lb 8 oz (85 kg)   Patient is in no distress; well developed, nourished and groomed; neck is supple  EYES: Pupils round and reactive to light, Visual fields full to confrontation, Extraocular movements intacts,  No results found.  MUSCULOSKELETAL: Gait,  strength, tone, movements noted in Neurologic exam below  NEUROLOGIC: MENTAL STATUS:      No data to display         awake, alert, oriented to person, place and time recent and remote memory intact normal attention and concentration language fluent, comprehension intact, naming intact fund of knowledge appropriate  CRANIAL NERVE:  2nd, 3rd, 4th, 6th - pupils equal and reactive to light, visual fields full to confrontation, extraocular muscles intact, no nystagmus 5th - facial sensation symmetric 7th - facial strength symmetric 8th - hearing intact 9th - palate elevates symmetrically, uvula midline 11th - shoulder shrug symmetric 12th - tongue protrusion midline  MOTOR:  normal bulk and tone, full strength in the BUE, BLE  SENSORY:  normal and symmetric to light touch, pinprick, temperature, vibration  COORDINATION:  finger-nose-finger, fine finger movements normal  REFLEXES:  deep tendon reflexes present and symmetric  GAIT/STATION:  normal   DIAGNOSTIC DATA (LABS, IMAGING, TESTING) - I reviewed patient records, labs, notes, testing and imaging myself where available.  Lab Results  Component Value Date   WBC 8.0 05/16/2022   HGB 13.1 05/16/2022   HCT 39.9 05/16/2022   MCV 86.7 05/16/2022   PLT 238 05/16/2022      Component Value Date/Time   NA 141 04/19/2022 2145   NA 139 12/01/2021 0920   K 4.0 04/19/2022 2145   CL 112 (H) 04/19/2022 2145   CO2 21 (L) 04/19/2022 2145   GLUCOSE 89 04/19/2022 2145   BUN 12 04/19/2022 2145   BUN 7 12/01/2021 0920   CREATININE 0.77 04/19/2022 2145   CALCIUM 8.7 (L) 04/19/2022 2145   PROT 7.1 04/19/2022 2145   PROT 5.9 (L) 12/01/2021 0920   ALBUMIN 4.0 04/19/2022 2145   ALBUMIN 3.7 (L) 12/01/2021 0920   AST 33 04/19/2022 2145   ALT 58 (H) 04/19/2022 2145   ALKPHOS 72 04/19/2022 2145   BILITOT 0.8 04/19/2022 2145   BILITOT <0.2 12/01/2021 0920   GFRNONAA >60 04/19/2022 2145  GFRAA >60 06/24/2020 1846   Lab  Results  Component Value Date   CHOL 101 08/09/2013   HDL 55 08/09/2013   LDLCALC 22 08/09/2013   TRIG 121 08/09/2013   Lab Results  Component Value Date   HGBA1C 5.0 05/14/2013   No results found for: "VITAMINB12" Lab Results  Component Value Date   TSH 0.512 01/07/2016    EEG 11/2021: Normal   MRI 11/2021: Normal MRI of the brain.  I personally reviewed brain Images and previous EEG reports.    ASSESSMENT AND PLAN  26 y.o. year old female  with history of asthma, history of seizure and pseudoseizure who is presenting to establish care.  She reports currently her seizures are under control, she is having 1-2 seizures every 2 weeks.  Currently she is on Keppra 1500 mg twice daily and Lamictal 25 mg daily.  She reported Lamictal was started back in January and she was supposed to follow-up with [place] but has not done so.  Patient also reported she is [redacted] weeks pregnant.  At this time I am going to continue the Keppra 1500 mg twice daily and titrate Lamictal to a good maintenance dose.  First I will titrate to 100 mg twice daily then obtain a level and at that time I will adjust as necessary.  Patient will come back in 5 weeks to obtain blood work but otherwise I will see her in 3 months for follow-up.   1. Seizure disorder (HCC)   2. [redacted] weeks gestation of pregnancy     Patient Instructions  Continue with Keppra 1500 mg BID  Increase Lamictal to 25 mg twice daily for one week  Then increase to 50 mg (two pills) twice daily  Then increase 75 mg (three pills) twice daily Then increase to 100 mg (fours pills) twice daily Follow up 5 weeks for labs  Routine EEG  Follow up in 3 months    Per [facility] statutes, patients with seizures are not allowed to drive until they have been seizure-free for six months.  Other recommendations include using caution when using heavy equipment or power tools. Avoid working on ladders or at heights. Take showers instead of baths.  Do not swim  alone.  Ensure the water temperature is not too high on the home water heater. Do not go swimming alone. Do not lock yourself in a room alone (i.e. bathroom). When caring for infants or small children, sit down when holding, feeding, or changing them to minimize risk of injury to the child in the event you have a seizure. Maintain good sleep hygiene. Avoid alcohol.  Also recommend adequate sleep, hydration, good diet and minimize stress.   During the Seizure  - First, ensure adequate ventilation and place patients on the floor on their left side  Loosen clothing around the neck and ensure the airway is patent. If the patient is clenching the teeth, do not force the mouth open with any object as this can cause severe damage - Remove all items from the surrounding that can be hazardous. The patient may be oblivious to what's happening and may not even know what he or she is doing. If the patient is confused and wandering, either gently guide him/her away and block access to outside areas - Reassure the individual and be comforting - Call 911. In most cases, the seizure ends before EMS arrives. However, there are cases when seizures may last over 3 to 5 minutes. Or the individual may  have developed breathing difficulties or severe injuries. If a pregnant patient or a person with diabetes develops a seizure, it is prudent to call an ambulance. - Finally, if the patient does not regain full consciousness, then call EMS. Most patients will remain confused for about 45 to 90 minutes after a seizure, so you must use judgment in calling for help. - Avoid restraints but make sure the patient is in a bed with padded side rails - Place the individual in a lateral position with the neck slightly flexed; this will help the saliva drain from the mouth and prevent the tongue from falling backward - Remove all nearby furniture and other hazards from the area - Provide verbal assurance as the individual is regaining  consciousness - Provide the patient with privacy if possible - Call for help and start treatment as ordered by the caregiver   After the Seizure (Postictal Stage)  After a seizure, most patients experience confusion, fatigue, muscle pain and/or a headache. Thus, one should permit the individual to sleep. For the next few days, reassurance is essential. Being calm and helping reorient the person is also of importance.  Most seizures are painless and end spontaneously. Seizures are not harmful to others but can lead to complications such as stress on the lungs, brain and the heart. Individuals with prior lung problems may develop labored breathing and respiratory distress.     Orders Placed This Encounter  Procedures   Levetiracetam level   Lamotrigine level   EEG adult    Meds ordered this encounter  Medications   lamoTRIgine (LAMICTAL) 25 MG tablet    Sig: 25 mg twice daily for one week, then increase to 50 mg twice daily for one week, then increase to 75 mg twice daily for one week then increase to 100 mg twice daily for one week    Dispense:  200 tablet    Refill:  0    Return in about 3 months (around 10/07/2022).    Alric Ran, MD 07/07/2022, 12:52 PM  [facility] [address], [place], [place] [address] [phone]

## 2022-07-15 ENCOUNTER — Telehealth: Payer: Medicaid Other

## 2022-07-22 ENCOUNTER — Encounter: Payer: Self-pay | Admitting: Family Medicine

## 2022-07-22 ENCOUNTER — Other Ambulatory Visit: Payer: Self-pay

## 2022-07-22 ENCOUNTER — Ambulatory Visit (INDEPENDENT_AMBULATORY_CARE_PROVIDER_SITE_OTHER): Payer: Medicaid Other | Admitting: Family Medicine

## 2022-07-22 ENCOUNTER — Ambulatory Visit: Payer: Medicaid Other | Admitting: Neurology

## 2022-07-22 VITALS — BP 139/85 | HR 100 | Wt 190.4 lb

## 2022-07-22 DIAGNOSIS — O09299 Supervision of pregnancy with other poor reproductive or obstetric history, unspecified trimester: Secondary | ICD-10-CM

## 2022-07-22 DIAGNOSIS — O09899 Supervision of other high risk pregnancies, unspecified trimester: Secondary | ICD-10-CM

## 2022-07-22 DIAGNOSIS — O09291 Supervision of pregnancy with other poor reproductive or obstetric history, first trimester: Secondary | ICD-10-CM

## 2022-07-22 DIAGNOSIS — O99211 Obesity complicating pregnancy, first trimester: Secondary | ICD-10-CM

## 2022-07-22 DIAGNOSIS — O0990 Supervision of high risk pregnancy, unspecified, unspecified trimester: Secondary | ICD-10-CM

## 2022-07-22 DIAGNOSIS — O0991 Supervision of high risk pregnancy, unspecified, first trimester: Secondary | ICD-10-CM

## 2022-07-22 DIAGNOSIS — O10911 Unspecified pre-existing hypertension complicating pregnancy, first trimester: Secondary | ICD-10-CM

## 2022-07-22 DIAGNOSIS — O10919 Unspecified pre-existing hypertension complicating pregnancy, unspecified trimester: Secondary | ICD-10-CM

## 2022-07-22 DIAGNOSIS — O99210 Obesity complicating pregnancy, unspecified trimester: Secondary | ICD-10-CM

## 2022-07-22 DIAGNOSIS — Z8759 Personal history of other complications of pregnancy, childbirth and the puerperium: Secondary | ICD-10-CM

## 2022-07-22 DIAGNOSIS — O09891 Supervision of other high risk pregnancies, first trimester: Secondary | ICD-10-CM

## 2022-07-22 DIAGNOSIS — Z3A13 13 weeks gestation of pregnancy: Secondary | ICD-10-CM

## 2022-07-22 MED ORDER — LABETALOL HCL 200 MG PO TABS: 200.0000 mg | ORAL_TABLET | Freq: Two times a day (BID) | ORAL | 3 refills | Status: DC

## 2022-07-22 MED ORDER — ASPIRIN 81 MG PO TBEC: 81.0000 mg | DELAYED_RELEASE_TABLET | Freq: Every day | ORAL | 2 refills | Status: DC

## 2022-07-22 NOTE — Progress Notes (Signed)
INITIAL PRENATAL VISIT  Subjective:   Erin Krueger is being seen today for her first obstetrical visit.  This is not a planned pregnancy. This is a desired pregnancy.  She is at [redacted]w[redacted]d gestation by [redacted]w[redacted]d [place].  Her obstetrical history is significant for obesity, pregnancy induced hypertension, and pre-eclampsia. Relationship with FOB: significant other, not living together. Patient does not intend to breast feed. Pregnancy history fully reviewed.  Patient reports no complaints.  Indications for ASA therapy (per uptodate) One of the following: Previous pregnancy with preeclampsia, especially early onset and with an adverse outcome No Multifetal gestation No Chronic hypertension Yes Type 1 or 2 diabetes mellitus No Chronic kidney disease No Autoimmune disease (antiphospholipid syndrome, systemic lupus erythematosus) No  Two or more of the following: Nulliparity No Obesity (body mass index >30 kg/m2) Yes Family history of preeclampsia in mother or sister No Age ?35 years No Sociodemographic characteristics (African American race, low socioeconomic level) No Personal risk factors (eg, previous pregnancy with low birth weight or small for gestational age infant, previous adverse pregnancy outcome [eg, stillbirth], interval >10 years between pregnancies) Yes  Indications for early GDM screening  First-degree relative with diabetes Yes BMI >30kg/m2 Yes Age > 25 Yes  Early screening tests: FBS, A1C, Random CBG, glucose challenge   Review of Systems:   Review of Systems  Objective:    Obstetric History OB History  Gravida Para Term Preterm AB Living  9 5 3 2 3 4   SAB IAB Ectopic Multiple Live Births  3     0 4    # Outcome Date GA Lbr Len/2nd Weight Sex Delivery Anes PTL Lv  9 Current           8 Preterm 01/24/22 [redacted]w[redacted]d 14:40 / 00:12 6 lb 7.4 oz (2.93 kg) F Vag-Spont EPI  LIV  7 SAB 2022             Birth Comments: states didn't go to doctor, had positive home  pregnancy  test , then bled and passed tissue- didn't go to appt because of history of 2 sab  6 Preterm 12/28/19 [redacted]w[redacted]d 08:08 / 00:05 6 lb 2.4 oz (2.79 kg) M Vag-Spont EPI  LIV     Complications: Eclampsia  5 Term 11/08/18 [redacted]w[redacted]d 00:15 / 00:18 7 lb 3.5 oz (3.275 kg) M Vag-Spont EPI  LIV     Birth Comments: WNL  4 Term 06/23/17 [redacted]w[redacted]d  6 lb 8 oz (2.948 kg) F Vag-Spont EPI N LIV     Birth Comments: baby had cardiac defect, otherwise wnl  3 SAB 07/2014 [redacted]w[redacted]d            Birth Comments: had D&C  2 Term 2014 [redacted]w[redacted]d       FD     Birth Comments: stillbirth  1 SAB 2014 [redacted]w[redacted]d           Past Medical History:  Diagnosis Date   ADD (attention deficit disorder)    ADHD (attention deficit hyperactivity disorder)    Adopted person 10/04/2019   Does not know personal family history other than Diabetes, Breast Cancer. Raised in foster care   Allergy    Anxiety    Asthma    Eating disorder    Headache(784.0)    History of eclampsia    History of pre-eclampsia    Kidney stone    kidney stones   Migraines    ODD (oppositional defiant disorder)    PID (acute pelvic inflammatory disease) 01/29/2016  Pregnancy induced hypertension    PTSD (post-traumatic stress disorder)    PTSD (post-traumatic stress disorder)    Seizures (HCC)    diagnosed age 85    Past Surgical History:  Procedure Laterality Date   DILATION AND CURETTAGE OF UTERUS     right knee surgery     TONSILLECTOMY AND ADENOIDECTOMY      Current Outpatient Medications on File Prior to Visit  Medication Sig Dispense Refill   acetaminophen (TYLENOL) 500 MG tablet Take 2 tablets (1,000 mg total) by mouth every 8 (eight) hours as needed (for pain). 60 tablet 0   FOLIC ACID PO Take by mouth.     lamoTRIgine (LAMICTAL) 25 MG tablet 25 mg twice daily for one week, then increase to 50 mg twice daily for one week, then increase to 75 mg twice daily for one week then increase to 100 mg twice daily for one week 200 tablet 0   levETIRAcetam (KEPPRA) 750 MG  tablet Take 1,500 mg by mouth 2 (two) times daily.     magnesium oxide (MAG-OX) 400 MG tablet Take 400 mg by mouth daily.     Prenatal Vit-Fe Fumarate-FA (PRENATAL VITAMIN PO) Take 1 capsule by mouth daily.     [DISCONTINUED] albuterol (PROVENTIL HFA;VENTOLIN HFA) 108 (90 Base) MCG/ACT inhaler Inhale 2 puffs into the lungs every 6 (six) hours as needed for wheezing or shortness of breath. 1 Inhaler 2   [DISCONTINUED] metoCLOPramide (REGLAN) 10 MG tablet Take 1 tablet (10 mg total) by mouth every 6 (six) hours as needed for nausea. 60 tablet 1   No current facility-administered medications on file prior to visit.    Allergies  Allergen Reactions   Bee Venom Anaphylaxis   Cashew Nut Oil Anaphylaxis   Penicillins Anaphylaxis    * Has tolerated several cephalosporins* Has patient had a PCN reaction causing immediate rash, facial/tongue/throat swelling, SOB or lightheadedness with hypotension: Unknown Has patient had a PCN reaction causing severe rash involving mucus membranes or skin necrosis: Unknown Has patient had a PCN reaction that required hospitalization: Unknown Has patient had a PCN reaction occurring within the last 10 years: Unknown If all of the above answers are "NO", then may proceed with Cephalosporin use.    Ativan [Lorazepam] Other (See Comments)    Aggressive-agitation   Other    Prednisone Other (See Comments)    Bleeding nose bleeding and internal bleeding   Latex Rash    Social History:  reports that she quit smoking about 14 months ago. Her smoking use included cigarettes and e-cigarettes. She smoked an average of .5 packs per day. She has never used smokeless tobacco. She reports that she does not currently use alcohol. She reports that she does not currently use drugs after having used the following drugs: Marijuana and Cocaine.  Family History  Adopted: Yes  Problem Relation Age of Onset   Diabetes Sister    Other Daughter        born with hole in heart    Other Son        born with hole in heart   Breast cancer Maternal Grandmother    Diabetes Maternal Grandmother    Glaucoma Maternal Grandmother     The following portions of the patient's history were reviewed and updated as appropriate: allergies, current medications, past family history, past medical history, past social history, past surgical history and problem list.  Review of Systems Review of Systems  Constitutional:  Negative for chills and fever.  HENT:  Negative for congestion and sore throat.   Eyes:  Negative for pain and visual disturbance.  Respiratory:  Negative for cough, chest tightness and shortness of breath.   Cardiovascular:  Negative for chest pain.  Gastrointestinal:  Negative for abdominal pain, diarrhea, nausea and vomiting.  Endocrine: Negative for cold intolerance and heat intolerance.  Genitourinary:  Negative for dysuria and flank pain.  Musculoskeletal:  Negative for back pain.  Skin:  Negative for rash.  Allergic/Immunologic: Negative for food allergies.  Neurological:  Negative for dizziness and light-headedness.  Psychiatric/Behavioral:  Negative for agitation.       Physical Exam:  BP 139/85   Pulse 100   Wt 190 lb 6.4 oz (86.4 kg)   LMP 04/10/2022   BMI 30.73 kg/m  CONSTITUTIONAL: Well-developed, well-nourished female in no acute distress.  HENT:  Normocephalic, atraumatic, External right and left ear normal. Oropharynx is clear and moist EYES: Conjunctivae normal. No scleral icterus.  NECK: Normal range of motion, supple, no masses.  Normal thyroid.  SKIN: Skin is warm and dry. No rash noted. Not diaphoretic. No erythema. No pallor. MUSCULOSKELETAL: Normal range of motion. No tenderness.  No cyanosis, clubbing, or edema.   NEUROLOGIC: Alert and oriented to person, place, and time. Normal muscle tone coordination.  PSYCHIATRIC: Normal mood and affect. Normal behavior. Normal judgment and thought content. CARDIOVASCULAR: Normal heart rate  noted, regular rhythm RESPIRATORY: Clear to auscultation bilaterally. Effort and breath sounds normal, no problems with respiration noted. BREASTS: Symmetric in size. No masses, skin changes, nipple drainage, or lymphadenopathy. ABDOMEN: Soft, normal bowel sounds, no distention noted.  No tenderness, rebound or guarding. Fundal ht: below pelvis PELVIC:deferred FHR: 154   Assessment:    Pregnancy: [id number]   1. History of pre-eclampsia in prior pregnancy, currently pregnant - Comprehensive metabolic panel - Protein / creatinine ratio, urine - aspirin EC 81 MG tablet; Take 1 tablet (81 mg total) by mouth daily. Take after 12 weeks for prevention of preeclampsia later in pregnancy  Dispense: 300 tablet; Refill: 2 - labetalol (NORMODYNE) 200 MG tablet; Take 1 tablet (200 mg total) by mouth 2 (two) times daily.  Dispense: 60 tablet; Refill: 3  2. History of eclampsia - Comprehensive metabolic panel - Protein / creatinine ratio, urine  3. Short interval between pregnancies affecting pregnancy, antepartum Last delivery was 01/2022  4. History of stillbirth 2014  5. Obesity in pregnancy TWG=2 lb 6.4 oz (1.089 kg)  Recommended 15-20 lbs  6. Supervision of high risk pregnancy, antepartum - Initial OB panel - HA1C - urine CX - Comprehensive metabolic panel - Protein / creatinine ratio, urine  7. Chronic hypertension affecting pregnancy Patient with home BP 150s/100s consistently with one that was 160/112 Had ER visit in June 113/92 and in May that was 124/98 - aspirin EC 81 MG tablet; Take 1 tablet (81 mg total) by mouth daily. Take after 12 weeks for prevention of preeclampsia later in pregnancy  Dispense: 300 tablet; Refill: 2 - labetalol (NORMODYNE) 200 MG tablet; Take 1 tablet (200 mg total) by mouth 2 (two) times daily.  Dispense: 60 tablet; Refill: 3      Plan:     Initial labs drawn. Prenatal vitamins. Problem list reviewed and updated. Reviewed in detail the nature of  the practice with collaborative care between  Genetic screening discussed: NIPS/ ordered. Role of ultrasound in pregnancy discussed; Anatomy US:  Scheduled . Amniocentesis discussed: not indicated. Follow up in 4 weeks. Discussed clinic routines, schedule of care  and testing, genetic screening options, involvement of students and residents under the direct supervision of APPs and doctors and presence of female providers. Pt verbalized understanding.   Federico Flake, MD 07/22/2022 2:38 PM

## 2022-07-23 ENCOUNTER — Encounter: Payer: Self-pay | Admitting: *Deleted

## 2022-07-23 LAB — CBC/D/PLT+RPR+RH+ABO+RUBIGG...
Antibody Screen: NEGATIVE
Basophils Absolute: 0 10*3/uL (ref 0.0–0.2)
Basos: 0 %
EOS (ABSOLUTE): 0.1 10*3/uL (ref 0.0–0.4)
Eos: 2 %
HCV Ab: NONREACTIVE
HIV Screen 4th Generation wRfx: NONREACTIVE
Hematocrit: 37.1 % (ref 34.0–46.6)
Hemoglobin: 12.5 g/dL (ref 11.1–15.9)
Hepatitis B Surface Ag: NEGATIVE
Immature Grans (Abs): 0 10*3/uL (ref 0.0–0.1)
Immature Granulocytes: 0 %
Lymphocytes Absolute: 2.7 10*3/uL (ref 0.7–3.1)
Lymphs: 32 %
MCH: 29.6 pg (ref 26.6–33.0)
MCHC: 33.7 g/dL (ref 31.5–35.7)
MCV: 88 fL (ref 79–97)
Monocytes Absolute: 0.5 10*3/uL (ref 0.1–0.9)
Monocytes: 6 %
Neutrophils Absolute: 5.1 10*3/uL (ref 1.4–7.0)
Neutrophils: 60 %
Platelets: 255 10*3/uL (ref 150–450)
RBC: 4.23 x10E6/uL (ref 3.77–5.28)
RDW: 13.4 % (ref 11.7–15.4)
RPR Ser Ql: NONREACTIVE
Rh Factor: POSITIVE
Rubella Antibodies, IGG: 8.26 index (ref >0.99)
WBC: 8.4 10*3/uL (ref 3.4–10.8)

## 2022-07-23 LAB — HEMOGLOBIN A1C
Est. average glucose Bld gHb Est-mCnc: 105 mg/dL
Hgb A1c MFr Bld: 5.3 % (ref 4.8–5.6)

## 2022-07-23 LAB — COMPREHENSIVE METABOLIC PANEL
ALT: 11 IU/L (ref 0–32)
AST: 10 IU/L (ref 0–40)
Albumin/Globulin Ratio: 1.7 (ref 1.2–2.2)
Albumin: 4.3 g/dL (ref 4.0–5.0)
Alkaline Phosphatase: 84 IU/L (ref 44–121)
BUN/Creatinine Ratio: 8 — ABNORMAL LOW (ref 9–23)
BUN: 5 mg/dL — ABNORMAL LOW (ref 6–20)
Bilirubin Total: 0.3 mg/dL (ref 0.0–1.2)
CO2: 18 mmol/L — ABNORMAL LOW (ref 20–29)
Calcium: 9.3 mg/dL (ref 8.7–10.2)
Chloride: 104 mmol/L (ref 96–106)
Creatinine, Ser: 0.59 mg/dL (ref 0.57–1.00)
Globulin, Total: 2.5 g/dL (ref 1.5–4.5)
Glucose: 79 mg/dL (ref 70–99)
Potassium: 4.1 mmol/L (ref 3.5–5.2)
Sodium: 139 mmol/L (ref 134–144)
Total Protein: 6.8 g/dL (ref 6.0–8.5)
eGFR: 128 mL/min/{1.73_m2} (ref >59)

## 2022-07-23 LAB — HCV INTERPRETATION

## 2022-07-28 ENCOUNTER — Other Ambulatory Visit: Payer: Medicaid Other | Admitting: *Deleted

## 2022-07-29 LAB — PANORAMA PRENATAL TEST FULL PANEL:PANORAMA TEST PLUS 5 ADDITIONAL MICRODELETIONS: FETAL FRACTION: 10.3

## 2022-08-10 ENCOUNTER — Other Ambulatory Visit: Payer: Self-pay | Admitting: Nurse Practitioner

## 2022-08-10 ENCOUNTER — Other Ambulatory Visit (HOSPITAL_COMMUNITY): Payer: Self-pay | Admitting: Nurse Practitioner

## 2022-08-10 DIAGNOSIS — Z0282 Encounter for adoption services: Secondary | ICD-10-CM | POA: Insufficient documentation

## 2022-08-10 DIAGNOSIS — R109 Unspecified abdominal pain: Secondary | ICD-10-CM

## 2022-08-19 ENCOUNTER — Encounter: Payer: Medicaid Other | Admitting: Obstetrics and Gynecology

## 2022-08-27 ENCOUNTER — Encounter (HOSPITAL_COMMUNITY): Payer: Self-pay

## 2022-08-27 ENCOUNTER — Ambulatory Visit (HOSPITAL_COMMUNITY): Admission: RE | Admit: 2022-08-27 | Payer: Medicaid Other | Source: Ambulatory Visit

## 2022-08-31 ENCOUNTER — Ambulatory Visit: Payer: Medicaid Other | Attending: Family Medicine

## 2022-08-31 ENCOUNTER — Ambulatory Visit: Payer: Medicaid Other | Admitting: *Deleted

## 2022-08-31 VITALS — BP 106/90 | HR 84

## 2022-08-31 DIAGNOSIS — O09899 Supervision of other high risk pregnancies, unspecified trimester: Secondary | ICD-10-CM

## 2022-08-31 DIAGNOSIS — O99210 Obesity complicating pregnancy, unspecified trimester: Secondary | ICD-10-CM | POA: Diagnosis present

## 2022-08-31 DIAGNOSIS — Z8759 Personal history of other complications of pregnancy, childbirth and the puerperium: Secondary | ICD-10-CM

## 2022-08-31 DIAGNOSIS — O09299 Supervision of pregnancy with other poor reproductive or obstetric history, unspecified trimester: Secondary | ICD-10-CM | POA: Diagnosis present

## 2022-08-31 DIAGNOSIS — O0990 Supervision of high risk pregnancy, unspecified, unspecified trimester: Secondary | ICD-10-CM | POA: Insufficient documentation

## 2022-09-01 ENCOUNTER — Other Ambulatory Visit: Payer: Self-pay | Admitting: *Deleted

## 2022-09-01 DIAGNOSIS — O09292 Supervision of pregnancy with other poor reproductive or obstetric history, second trimester: Secondary | ICD-10-CM

## 2022-09-01 DIAGNOSIS — O99212 Obesity complicating pregnancy, second trimester: Secondary | ICD-10-CM

## 2022-09-01 DIAGNOSIS — O09892 Supervision of other high risk pregnancies, second trimester: Secondary | ICD-10-CM

## 2022-09-28 ENCOUNTER — Ambulatory Visit: Payer: Medicaid Other | Admitting: *Deleted

## 2022-09-28 ENCOUNTER — Encounter: Payer: Self-pay | Admitting: *Deleted

## 2022-09-28 ENCOUNTER — Ambulatory Visit: Payer: Medicaid Other | Attending: Obstetrics

## 2022-09-28 VITALS — BP 129/58 | HR 78

## 2022-09-28 DIAGNOSIS — Z8759 Personal history of other complications of pregnancy, childbirth and the puerperium: Secondary | ICD-10-CM | POA: Insufficient documentation

## 2022-09-28 DIAGNOSIS — O99210 Obesity complicating pregnancy, unspecified trimester: Secondary | ICD-10-CM | POA: Diagnosis present

## 2022-09-28 DIAGNOSIS — O0990 Supervision of high risk pregnancy, unspecified, unspecified trimester: Secondary | ICD-10-CM | POA: Diagnosis present

## 2022-09-28 DIAGNOSIS — O09892 Supervision of other high risk pregnancies, second trimester: Secondary | ICD-10-CM | POA: Diagnosis present

## 2022-09-28 DIAGNOSIS — O99212 Obesity complicating pregnancy, second trimester: Secondary | ICD-10-CM | POA: Diagnosis present

## 2022-09-28 DIAGNOSIS — O09299 Supervision of pregnancy with other poor reproductive or obstetric history, unspecified trimester: Secondary | ICD-10-CM | POA: Diagnosis present

## 2022-09-28 DIAGNOSIS — O09899 Supervision of other high risk pregnancies, unspecified trimester: Secondary | ICD-10-CM | POA: Diagnosis present

## 2022-09-28 DIAGNOSIS — O09292 Supervision of pregnancy with other poor reproductive or obstetric history, second trimester: Secondary | ICD-10-CM | POA: Insufficient documentation

## 2022-09-29 ENCOUNTER — Other Ambulatory Visit: Payer: Self-pay | Admitting: *Deleted

## 2022-09-29 DIAGNOSIS — O09892 Supervision of other high risk pregnancies, second trimester: Secondary | ICD-10-CM

## 2022-09-29 DIAGNOSIS — O10912 Unspecified pre-existing hypertension complicating pregnancy, second trimester: Secondary | ICD-10-CM

## 2022-09-29 DIAGNOSIS — O09292 Supervision of pregnancy with other poor reproductive or obstetric history, second trimester: Secondary | ICD-10-CM

## 2022-09-29 DIAGNOSIS — O99212 Obesity complicating pregnancy, second trimester: Secondary | ICD-10-CM

## 2022-10-12 ENCOUNTER — Other Ambulatory Visit: Payer: Self-pay | Admitting: Neurology

## 2022-10-12 MED ORDER — LAMOTRIGINE 25 MG PO TABS: ORAL_TABLET | ORAL | 1 refills | Status: DC

## 2022-10-21 ENCOUNTER — Ambulatory Visit: Payer: Medicaid Other | Admitting: Neurology

## 2022-10-21 ENCOUNTER — Encounter: Payer: Self-pay | Admitting: Neurology

## 2022-10-21 VITALS — BP 126/83 | HR 94 | Ht 67.0 in | Wt 216.5 lb

## 2022-10-21 DIAGNOSIS — G40909 Epilepsy, unspecified, not intractable, without status epilepticus: Secondary | ICD-10-CM | POA: Diagnosis not present

## 2022-10-21 MED ORDER — LEVETIRACETAM 750 MG PO TABS: 1500.0000 mg | ORAL_TABLET | Freq: Two times a day (BID) | ORAL | 6 refills | Status: DC

## 2022-10-21 MED ORDER — LAMOTRIGINE 100 MG PO TABS: 100.0000 mg | ORAL_TABLET | Freq: Two times a day (BID) | ORAL | 6 refills | Status: DC

## 2022-10-21 NOTE — Progress Notes (Signed)
[facility]  PATIENT: Erin Krueger DOB: [birth year]  REQUESTING CLINICIAN: No ref. provider found HISTORY FROM: Patient  REASON FOR VISIT: Epilepsy    HISTORICAL  CHIEF COMPLAINT:  Chief Complaint  Patient presents with   Follow-up    Rm 15. Alone. Pt reports one seizure since last visit, lasted 10-15 seconds with a 20-30 minute postictal period. Pt has been out of lamotrigine for 2 days. Requesting refills of seizure medications.   INTERVAL HISTORY 10/21/22:  Patient presents today for follow up. At last visit, we have restarted her on Lamotrigine with goal of 100 mg BID. She reports reaching 100 mg twice daily but ran out of meds 2 days ago. She reports one seizure 2.5 weeks ago, this was in the setting of watching a movie with flashing light. Husband told that she had a brief seizure of 10 to 15 sec. She reports being confused afterward, denies any injuries, did not go to the hospital.    HISTORY OF PRESENT ILLNESS:  This is a 26 year old woman with past medical history of asthma and seizure disorder who is presenting for follow-up.  She reported being diagnosed with seizure at the age of 7 but having her first seizure 4 months.  She has been on multiple medication including Dilantin, Depakote, and currently on Keppra and Lamictal.  She reported her seizures have never been controlled.  She had different type of seizure including staring spells, episode of tensing up and an episode of full body shaking.  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  Patient reported she is currently [redacted] weeks pregnant, and her last seizure was 2 weeks ago, described as tensing up during her sleep.  No tongue biting, no urinary incontinence, at one time, she had a fall and had a concussion.  She had work-up in the past including EEG and MRI which were negative.   Handedness: Right handed   Onset: At the age of 7  Seizure Type: Staring spells, episode of whole  body shaking, and other episode of full body tensing up  Current frequency: 1 to 2 seizure every 2 weeks   Any injuries from seizures: Concussion   Seizure risk factors: Family history of seizures, grandmother and mother   Previous ASMs: Dilantin, Depakote  Currenty ASMs: Keppra and Lamictal   ASMs side effects: Denies   Brain Images: Normal MRI   Previous EEGs: Normal EEG    OTHER MEDICAL CONDITIONS: Asthma   REVIEW OF SYSTEMS: Full 14 system review of systems performed and negative with exception of: as noted in the HPI   ALLERGIES: Allergies  Allergen Reactions   Bee Venom Anaphylaxis   Cashew Nut Oil Anaphylaxis   Penicillins Anaphylaxis    * Has tolerated several cephalosporins* Has patient had a PCN reaction causing immediate rash, facial/tongue/throat swelling, SOB or lightheadedness with hypotension: Unknown Has patient had a PCN reaction causing severe rash involving mucus membranes or skin necrosis: Unknown Has patient had a PCN reaction that required hospitalization: Unknown Has patient had a PCN reaction occurring within the last 10 years: Unknown If all of the above answers are "NO", then may proceed with Cephalosporin use.    Ativan [Lorazepam] Other (See Comments)    Aggressive-agitation   Other    Prednisone Other (See Comments)    Bleeding nose bleeding and internal bleeding   Latex Rash    HOME MEDICATIONS: Outpatient Medications Prior to Visit  Medication Sig Dispense Refill   acetaminophen (TYLENOL)  500 MG tablet Take 2 tablets (1,000 mg total) by mouth every 8 (eight) hours as needed (for pain). 60 tablet 0   aspirin EC 81 MG tablet Take 1 tablet (81 mg total) by mouth daily. Take after 12 weeks for prevention of preeclampsia later in pregnancy 300 tablet 2   EPINEPHrine 0.3 mg/0.3 mL IJ SOAJ injection Inject 0.3 mg into the muscle as needed for anaphylaxis.     FOLIC ACID PO Take by mouth.     labetalol (NORMODYNE) 200 MG tablet Take 1 tablet  (200 mg total) by mouth 2 (two) times daily. 60 tablet 3   magnesium oxide (MAG-OX) 400 MG tablet Take 400 mg by mouth daily.     Prenatal Vit-Fe Fumarate-FA (PRENATAL VITAMIN PO) Take 1 capsule by mouth daily.     lamoTRIgine (LAMICTAL) 100 MG tablet Take 100 mg by mouth 2 (two) times daily.     levETIRAcetam (KEPPRA) 750 MG tablet Take 1,500 mg by mouth 2 (two) times daily.     lamoTRIgine (LAMICTAL) 25 MG tablet 25 mg twice daily for one week, then increase to 50 mg twice daily for one week, then increase to 75 mg twice daily for one week then increase to 100 mg twice daily for one week 200 tablet 1   No facility-administered medications prior to visit.    PAST MEDICAL HISTORY: Past Medical History:  Diagnosis Date   ADD (attention deficit disorder)    ADHD (attention deficit hyperactivity disorder)    Adopted person 10/04/2019   Does not know personal family history other than Diabetes, Breast Cancer. Raised in foster care   Allergy    Anxiety    Asthma    Eating disorder    Headache(784.0)    History of eclampsia    History of pre-eclampsia    Kidney stone    kidney stones   Migraines    ODD (oppositional defiant disorder)    PID (acute pelvic inflammatory disease) 01/29/2016   Pregnancy induced hypertension    PTSD (post-traumatic stress disorder)    PTSD (post-traumatic stress disorder)    Seizures (HCC)    diagnosed age 72    PAST SURGICAL HISTORY: Past Surgical History:  Procedure Laterality Date   DILATION AND CURETTAGE OF UTERUS     right knee surgery     TONSILLECTOMY AND ADENOIDECTOMY      FAMILY HISTORY: Family History  Adopted: Yes  Problem Relation Age of Onset   Diabetes Sister    Other Daughter        born with hole in heart   Other Son        born with hole in heart   Breast cancer Maternal Grandmother    Diabetes Maternal Grandmother    Glaucoma Maternal Grandmother     SOCIAL HISTORY: Social History   Socioeconomic History   Marital  status: Married    Spouse name: Not on file   Number of children: 2   Years of education: Not on file   Highest education level: Not on file  Occupational History   Occupation: disabled has epilepsy   Tobacco Use   Smoking status: Former    Packs/day: 0.50    Types: Cigarettes, E-cigarettes    Quit date: 04/30/2021    Years since quitting: 1.4   Smokeless tobacco: Never   Tobacco comments:    nicotine patch  Vaping Use   Vaping Use: Former   Substances: Flavoring  Substance and Sexual Activity   Alcohol  use: Not Currently    Comment: drinks beer on weekends when not pregnant   Drug use: Not Currently    Types: Marijuana, Cocaine    Comment: Not since 2018   Sexual activity: Yes    Birth control/protection: None  Other Topics Concern   Not on file  Social History Narrative   Not on file   Social Determinants of Health   Financial Resource Strain: Low Risk  (08/12/2021)   Overall Financial Resource Strain (CARDIA)    Difficulty of Paying Living Expenses: Not hard at all  Food Insecurity: No Food Insecurity (06/17/2022)   Hunger Vital Sign    Worried About Running Out of Food in the Last Year: Never true    Ran Out of Food in the Last Year: Never true  Transportation Needs: No Transportation Needs (06/17/2022)   PRAPARE - Administrator, Civil Service (Medical): No    Lack of Transportation (Non-Medical): No  Physical Activity: Sufficiently Active (08/12/2021)   Exercise Vital Sign    Days of Exercise per Week: 4 days    Minutes of Exercise per Session: 60 min  Stress: No Stress Concern Present (08/12/2021)   [employer] of Occupational Health - Occupational Stress Questionnaire    Feeling of Stress : Not at all  Social Connections: Moderately Integrated (08/12/2021)   Social Connection and Isolation Panel [NHANES]    Frequency of Communication with Friends and Family: Three times a week    Frequency of Social Gatherings with Friends and Family: More than  three times a week    Attends Religious Services: 1 to 4 times per year    Active Member of [employer] or Organizations: No    Attends Banker Meetings: Never    Marital Status: Living with partner  Intimate Partner Violence: Not At Risk (08/12/2021)   Humiliation, Afraid, Rape, and Kick questionnaire    Fear of Current or Ex-Partner: No    Emotionally Abused: No    Physically Abused: No    Sexually Abused: No    PHYSICAL EXAM  GENERAL EXAM/CONSTITUTIONAL: Vitals:  Vitals:   10/21/22 1438  BP: 126/83  Pulse: 94  Weight: 216 lb 8 oz (98.2 kg)  Height: 5\' 7"  (1.702 m)    Body mass index is 33.91 kg/m. Wt Readings from Last 3 Encounters:  10/21/22 216 lb 8 oz (98.2 kg)  07/22/22 190 lb 6.4 oz (86.4 kg)  07/07/22 190 lb (86.2 kg)   Patient is in no distress; well developed, nourished and groomed; neck is supple  EYES: Visual fields full to confrontation, Extraocular movements intacts,  No results found.  MUSCULOSKELETAL: Gait, strength, tone, movements noted in Neurologic exam below  NEUROLOGIC: MENTAL STATUS:      No data to display         awake, alert, oriented to person, place and time recent and remote memory intact normal attention and concentration language fluent, comprehension intact, naming intact fund of knowledge appropriate  CRANIAL NERVE:  2nd, 3rd, 4th, 6th - visual fields full to confrontation, extraocular muscles intact, no nystagmus 5th - facial sensation symmetric 7th - facial strength symmetric 8th - hearing intact 9th - palate elevates symmetrically, uvula midline 11th - shoulder shrug symmetric 12th - tongue protrusion midline  MOTOR:  normal bulk and tone, full strength in the BUE, BLE  SENSORY:  normal and symmetric to light touch, pinprick, temperature, vibration  COORDINATION:  finger-nose-finger, fine finger movements normal  REFLEXES:  deep tendon reflexes  present and symmetric  GAIT/STATION:   normal   DIAGNOSTIC DATA (LABS, IMAGING, TESTING) - I reviewed patient records, labs, notes, testing and imaging myself where available.  Lab Results  Component Value Date   WBC 8.4 07/22/2022   HGB 12.5 07/22/2022   HCT 37.1 07/22/2022   MCV 88 07/22/2022   PLT 255 07/22/2022      Component Value Date/Time   NA 139 07/22/2022 1613   K 4.1 07/22/2022 1613   CL 104 07/22/2022 1613   CO2 18 (L) 07/22/2022 1613   GLUCOSE 79 07/22/2022 1613   GLUCOSE 89 04/19/2022 2145   BUN 5 (L) 07/22/2022 1613   CREATININE 0.59 07/22/2022 1613   CALCIUM 9.3 07/22/2022 1613   PROT 6.8 07/22/2022 1613   ALBUMIN 4.3 07/22/2022 1613   AST 10 07/22/2022 1613   ALT 11 07/22/2022 1613   ALKPHOS 84 07/22/2022 1613   BILITOT 0.3 07/22/2022 1613   GFRNONAA >60 04/19/2022 2145   GFRAA >60 06/24/2020 1846   Lab Results  Component Value Date   CHOL 101 08/09/2013   HDL 55 08/09/2013   LDLCALC 22 08/09/2013   TRIG 121 08/09/2013   Lab Results  Component Value Date   HGBA1C 5.3 07/22/2022   No results found for: "VITAMINB12" Lab Results  Component Value Date   TSH 0.512 01/07/2016    EEG 11/2021: Normal   MRI 11/2021: Normal MRI of the brain.  I personally reviewed brain Images and previous EEG reports.    ASSESSMENT AND PLAN  26 y.o. year old female  with history of asthma, history of seizure and pseudoseizure who is presenting for follow up. Last seizure  was 2.5 week ago, lasted about 10 sec. Currently on Keppra 1500 mg and Lamotrigine 100 mg BID but ran out of meds 2 days ago. Will send new prescriptions and patient will come back the week of Dec 18 for lab works. Follow up in 3 months     1. Seizure disorder [facility](HCC)      Patient Instructions  Continue with Keppra 1500 mg BID  Continue with Lamotrigine 100 mg BID  Lab works the week of Dec 18.  Follow up in 3 months or sooner if worse    [place] [facility] [place] [place], patients with seizures are not allowed to drive until  they have been seizure-free for six months.  Other recommendations include using caution when using heavy equipment or power tools. Avoid working on ladders or at heights. Take showers instead of baths.  Do not swim alone.  Ensure the water temperature is not too high on the home water heater. Do not go swimming alone. Do not lock yourself in a room alone (i.e. bathroom). When caring for infants or small children, sit down when holding, feeding, or changing them to minimize risk of injury to the child in the event you have a seizure. Maintain good sleep hygiene. Avoid alcohol.  Also recommend adequate sleep, hydration, good diet and minimize stress.   During the Seizure  - First, ensure adequate ventilation and place patients on the floor on their left side  Loosen clothing around the neck and ensure the airway is patent. If the patient is clenching the teeth, do not force the mouth open with any object as this can cause severe damage - Remove all items from the surrounding that can be hazardous. The patient may be oblivious to what's happening and may not even know what he or she is doing. If the  patient is confused and wandering, either gently guide him/her away and block access to outside areas - Reassure the individual and be comforting - Call 911. In most cases, the seizure ends before EMS arrives. However, there are cases when seizures may last over 3 to 5 minutes. Or the individual may have developed breathing difficulties or severe injuries. If a pregnant patient or a person with diabetes develops a seizure, it is prudent to call an ambulance. - Finally, if the patient does not regain full consciousness, then call EMS. Most patients will remain confused for about 45 to 90 minutes after a seizure, so you must use judgment in calling for help. - Avoid restraints but make sure the patient is in a bed with padded side rails - Place the individual in a lateral position with the neck slightly flexed;  this will help the saliva drain from the mouth and prevent the tongue from falling backward - Remove all nearby furniture and other hazards from the area - Provide verbal assurance as the individual is regaining consciousness - Provide the patient with privacy if possible - Call for help and start treatment as ordered by the caregiver   After the Seizure (Postictal Stage)  After a seizure, most patients experience confusion, fatigue, muscle pain and/or a headache. Thus, one should permit the individual to sleep. For the next few days, reassurance is essential. Being calm and helping reorient the person is also of importance.  Most seizures are painless and end spontaneously. Seizures are not harmful to others but can lead to complications such as stress on the lungs, brain and the heart. Individuals with prior lung problems may develop labored breathing and respiratory distress.     Orders Placed This Encounter  Procedures   Lamotrigine level   Levetiracetam level    Meds ordered this encounter  Medications   lamoTRIgine (LAMICTAL) 100 MG tablet    Sig: Take 1 tablet (100 mg total) by mouth 2 (two) times daily.    Dispense:  60 tablet    Refill:  6   levETIRAcetam (KEPPRA) 750 MG tablet    Sig: Take 2 tablets (1,500 mg total) by mouth 2 (two) times daily.    Dispense:  120 tablet    Refill:  6    Return in about 3 months (around 01/21/2023).    Windell Norfolk, MD 10/21/2022, 2:56 PM  [facility] [address] [place], [place] [address] [phone]

## 2022-10-21 NOTE — Patient Instructions (Signed)
Continue with Keppra 1500 mg BID  Continue with Lamotrigine 100 mg BID  Lab works the week of Dec 18.  Follow up in 3 months or sooner if worse

## 2022-10-26 ENCOUNTER — Ambulatory Visit: Payer: Medicaid Other | Attending: Maternal & Fetal Medicine

## 2022-10-26 ENCOUNTER — Encounter: Payer: Self-pay | Admitting: *Deleted

## 2022-10-26 ENCOUNTER — Ambulatory Visit: Payer: Medicaid Other | Admitting: *Deleted

## 2022-10-26 VITALS — BP 128/94 | HR 102

## 2022-10-26 DIAGNOSIS — O10912 Unspecified pre-existing hypertension complicating pregnancy, second trimester: Secondary | ICD-10-CM | POA: Diagnosis present

## 2022-10-26 DIAGNOSIS — O99210 Obesity complicating pregnancy, unspecified trimester: Secondary | ICD-10-CM | POA: Diagnosis present

## 2022-10-26 DIAGNOSIS — E669 Obesity, unspecified: Secondary | ICD-10-CM | POA: Diagnosis not present

## 2022-10-26 DIAGNOSIS — O10012 Pre-existing essential hypertension complicating pregnancy, second trimester: Secondary | ICD-10-CM | POA: Diagnosis not present

## 2022-10-26 DIAGNOSIS — O09299 Supervision of pregnancy with other poor reproductive or obstetric history, unspecified trimester: Secondary | ICD-10-CM

## 2022-10-26 DIAGNOSIS — Z362 Encounter for other antenatal screening follow-up: Secondary | ICD-10-CM | POA: Diagnosis not present

## 2022-10-26 DIAGNOSIS — O99332 Smoking (tobacco) complicating pregnancy, second trimester: Secondary | ICD-10-CM

## 2022-10-26 DIAGNOSIS — Z8759 Personal history of other complications of pregnancy, childbirth and the puerperium: Secondary | ICD-10-CM | POA: Insufficient documentation

## 2022-10-26 DIAGNOSIS — O09899 Supervision of other high risk pregnancies, unspecified trimester: Secondary | ICD-10-CM | POA: Diagnosis present

## 2022-10-26 DIAGNOSIS — O99212 Obesity complicating pregnancy, second trimester: Secondary | ICD-10-CM | POA: Diagnosis not present

## 2022-10-26 DIAGNOSIS — O0990 Supervision of high risk pregnancy, unspecified, unspecified trimester: Secondary | ICD-10-CM

## 2022-10-26 DIAGNOSIS — O09892 Supervision of other high risk pregnancies, second trimester: Secondary | ICD-10-CM | POA: Diagnosis present

## 2022-10-26 DIAGNOSIS — O09292 Supervision of pregnancy with other poor reproductive or obstetric history, second trimester: Secondary | ICD-10-CM | POA: Insufficient documentation

## 2022-10-26 DIAGNOSIS — Z3A27 27 weeks gestation of pregnancy: Secondary | ICD-10-CM

## 2022-10-26 DIAGNOSIS — Z87891 Personal history of nicotine dependence: Secondary | ICD-10-CM

## 2022-10-27 ENCOUNTER — Other Ambulatory Visit: Payer: Self-pay | Admitting: *Deleted

## 2022-10-27 DIAGNOSIS — O99213 Obesity complicating pregnancy, third trimester: Secondary | ICD-10-CM

## 2022-10-27 DIAGNOSIS — Z8759 Personal history of other complications of pregnancy, childbirth and the puerperium: Secondary | ICD-10-CM

## 2022-10-27 DIAGNOSIS — O09293 Supervision of pregnancy with other poor reproductive or obstetric history, third trimester: Secondary | ICD-10-CM

## 2022-10-27 DIAGNOSIS — O09893 Supervision of other high risk pregnancies, third trimester: Secondary | ICD-10-CM

## 2022-10-27 DIAGNOSIS — O10919 Unspecified pre-existing hypertension complicating pregnancy, unspecified trimester: Secondary | ICD-10-CM

## 2022-11-03 ENCOUNTER — Encounter: Payer: Medicaid Other | Admitting: Family Medicine

## 2022-11-05 ENCOUNTER — Encounter: Payer: Self-pay | Admitting: *Deleted

## 2022-11-24 ENCOUNTER — Ambulatory Visit: Payer: Medicaid Other | Attending: Maternal & Fetal Medicine

## 2022-11-24 ENCOUNTER — Ambulatory Visit: Payer: Medicaid Other

## 2022-12-01 ENCOUNTER — Ambulatory Visit (INDEPENDENT_AMBULATORY_CARE_PROVIDER_SITE_OTHER): Payer: Medicaid Other | Admitting: Obstetrics and Gynecology

## 2022-12-01 ENCOUNTER — Ambulatory Visit: Payer: Medicaid Other | Attending: Obstetrics

## 2022-12-01 ENCOUNTER — Ambulatory Visit: Payer: Medicaid Other | Admitting: *Deleted

## 2022-12-01 ENCOUNTER — Other Ambulatory Visit: Payer: Self-pay

## 2022-12-01 ENCOUNTER — Other Ambulatory Visit (HOSPITAL_COMMUNITY)
Admission: RE | Admit: 2022-12-01 | Discharge: 2022-12-01 | Disposition: A | Discharge status: Home / Self Care | Payer: Medicaid Other | Source: Ambulatory Visit | Attending: Obstetrics and Gynecology | Admitting: Obstetrics and Gynecology

## 2022-12-01 VITALS — BP 125/70 | HR 88

## 2022-12-01 VITALS — BP 129/95 | HR 88 | Wt 223.0 lb

## 2022-12-01 DIAGNOSIS — O09892 Supervision of other high risk pregnancies, second trimester: Secondary | ICD-10-CM | POA: Diagnosis present

## 2022-12-01 DIAGNOSIS — F445 Conversion disorder with seizures or convulsions: Secondary | ICD-10-CM

## 2022-12-01 DIAGNOSIS — Z3A32 32 weeks gestation of pregnancy: Secondary | ICD-10-CM

## 2022-12-01 DIAGNOSIS — O09293 Supervision of pregnancy with other poor reproductive or obstetric history, third trimester: Secondary | ICD-10-CM | POA: Diagnosis present

## 2022-12-01 DIAGNOSIS — O09299 Supervision of pregnancy with other poor reproductive or obstetric history, unspecified trimester: Secondary | ICD-10-CM | POA: Insufficient documentation

## 2022-12-01 DIAGNOSIS — Z8759 Personal history of other complications of pregnancy, childbirth and the puerperium: Secondary | ICD-10-CM | POA: Insufficient documentation

## 2022-12-01 DIAGNOSIS — O0990 Supervision of high risk pregnancy, unspecified, unspecified trimester: Secondary | ICD-10-CM | POA: Insufficient documentation

## 2022-12-01 DIAGNOSIS — O99213 Obesity complicating pregnancy, third trimester: Secondary | ICD-10-CM | POA: Insufficient documentation

## 2022-12-01 DIAGNOSIS — O09292 Supervision of pregnancy with other poor reproductive or obstetric history, second trimester: Secondary | ICD-10-CM | POA: Diagnosis present

## 2022-12-01 DIAGNOSIS — O10013 Pre-existing essential hypertension complicating pregnancy, third trimester: Secondary | ICD-10-CM | POA: Diagnosis not present

## 2022-12-01 DIAGNOSIS — O09893 Supervision of other high risk pregnancies, third trimester: Secondary | ICD-10-CM | POA: Diagnosis present

## 2022-12-01 DIAGNOSIS — O10912 Unspecified pre-existing hypertension complicating pregnancy, second trimester: Secondary | ICD-10-CM | POA: Insufficient documentation

## 2022-12-01 DIAGNOSIS — Z23 Encounter for immunization: Secondary | ICD-10-CM

## 2022-12-01 DIAGNOSIS — O09899 Supervision of other high risk pregnancies, unspecified trimester: Secondary | ICD-10-CM

## 2022-12-01 DIAGNOSIS — O368330 Maternal care for abnormalities of the fetal heart rate or rhythm, third trimester, not applicable or unspecified: Secondary | ICD-10-CM | POA: Diagnosis not present

## 2022-12-01 DIAGNOSIS — O99210 Obesity complicating pregnancy, unspecified trimester: Secondary | ICD-10-CM

## 2022-12-01 DIAGNOSIS — O0943 Supervision of pregnancy with grand multiparity, third trimester: Secondary | ICD-10-CM | POA: Diagnosis not present

## 2022-12-01 DIAGNOSIS — O10919 Unspecified pre-existing hypertension complicating pregnancy, unspecified trimester: Secondary | ICD-10-CM | POA: Diagnosis present

## 2022-12-01 DIAGNOSIS — E669 Obesity, unspecified: Secondary | ICD-10-CM

## 2022-12-01 DIAGNOSIS — O99212 Obesity complicating pregnancy, second trimester: Secondary | ICD-10-CM | POA: Diagnosis present

## 2022-12-01 DIAGNOSIS — O99350 Diseases of the nervous system complicating pregnancy, unspecified trimester: Secondary | ICD-10-CM

## 2022-12-01 DIAGNOSIS — Z8279 Family history of other congenital malformations, deformations and chromosomal abnormalities: Secondary | ICD-10-CM

## 2022-12-01 DIAGNOSIS — G40909 Epilepsy, unspecified, not intractable, without status epilepticus: Secondary | ICD-10-CM

## 2022-12-01 LAB — POCT URINALYSIS DIP (DEVICE)
Bilirubin Urine: NEGATIVE
Glucose, UA: NEGATIVE mg/dL
Hgb urine dipstick: NEGATIVE
Ketones, ur: NEGATIVE mg/dL
Nitrite: NEGATIVE
Protein, ur: NEGATIVE mg/dL
Specific Gravity, Urine: 1.015 (ref 1.005–1.030)
Urobilinogen, UA: 0.2 mg/dL (ref 0.0–1.0)
pH: 7 (ref 5.0–8.0)

## 2022-12-01 NOTE — Progress Notes (Signed)
PRENATAL VISIT NOTE  Subjective:  Erin Krueger is a 27 y.o. [phone] at [redacted]w[redacted]d being seen today for ongoing prenatal care.  She is currently monitored for the following issues for this high-risk pregnancy and has PTSD (post-traumatic stress disorder); ADHD (attention deficit hyperactivity disorder), combined type; Psychogenic nonepileptic seizure; H/o child with congenital heart defect; Asthma; Chronic hypertension affecting pregnancy; Seizure disorder during pregnancy, antepartum (HCC); IUD (intrauterine device) in place; Unspecified mood (affective) disorder (HCC); History of pre-eclampsia in prior pregnancy, currently pregnant; History of eclampsia; Short interval between pregnancies affecting pregnancy, antepartum; History of stillbirth; Obesity in pregnancy; Supervision of high risk pregnancy, antepartum; and Adopted on their problem list.  Patient reports  reports blood show but after realized her doula checked her cervix. She reports HA but not currently just intermittently .  Contractions: Not present. Vag. Bleeding: Scant.  Movement: Present. Denies leaking of fluid.   The following portions of the patient's history were reviewed and updated as appropriate: allergies, current medications, past family history, past medical history, past social history, past surgical history and problem list.   Objective:   Vitals:   12/01/22 0934  BP: (!) 129/95  Pulse: 88  Weight: 223 lb (101.2 kg)    Fetal Status: Fetal Heart Rate (bpm): 154   Movement: Present     General:  Alert, oriented and cooperative. Patient is in no acute distress.  Skin: Skin is warm and dry. No rash noted.   Cardiovascular: Normal heart rate noted  Respiratory: Normal respiratory effort, no problems with respiration noted  Abdomen: Soft, gravid, appropriate for gestational age.  Pain/Pressure: Present     Pelvic: Cervical exam deferred        Extremities: Normal range of motion.  Edema: None  Mental Status: Normal  mood and affect. Normal behavior. Normal judgment and thought content.   Assessment and Plan:  Pregnancy: [id number] at [redacted]w[redacted]d 1. Seizure disorder during pregnancy, antepartum [facility]) Taking her routine home meds. Thought to be psychogenic as noted below.   2. Psychogenic nonepileptic seizure  3. H/o child with congenital heart defect Normal fetal echo on 11/30  4. History of eclampsia Questionable if eclampsia versus her seizure disorder  5. Short interval between pregnancies affecting pregnancy, antepartum Growth wnl on 12/4.   6. History of stillbirth Antenatal testing at 32 weeks.   7. Obesity in pregnancy  8. Supervision of high risk pregnancy, antepartum MOC: Would like try ppIUD again.  MOF: Breast Accepts tdap today.  Plans to move to [place] in the next two weeks!  Cultures done today for some bloody show type discharge - her doula checked her cervix. Plans [facility] [place].  Discussed flu shot and RSV vaccine. Pt declines flu shot. She will consider RSV vaccine.  BP appropriate today but will check labs today given her history.   Preterm labor symptoms and general obstetric precautions including but not limited to vaginal bleeding, contractions, leaking of fluid and fetal movement were reviewed in detail with the patient. Please refer to After Visit Summary for other counseling recommendations.   Return in about 2 weeks (around 12/15/2022) for OB VISIT, MD or APP.  Future Appointments  Date Time Provider Department Center  12/01/2022 11:15 AM [facility] NURSE [facility]  12/01/2022 11:30 AM [facility] US3 [facility]  12/08/2022 11:15 AM [facility] NURSE [facility]  12/08/2022 11:30 AM [facility] US3 [facility]  12/15/2022 11:15 AM [facility] NURSE [facility]  12/15/2022 11:30 AM [facility] US3 [facility]  12/18/2022  10:55 AM Radene Gunning, MD [facility]  12/22/2022 11:15 AM [facility] NURSE [facility]  12/22/2022 11:30 AM [facility] US3 [facility]  12/31/2022  2:35 PM  Aletha Halim, MD [facility]  01/08/2023  8:15 AM Radene Gunning, MD [facility]  01/15/2023  8:55 AM Chancy Milroy, MD [facility]  01/22/2023  8:55 AM Anyanwu, Sallyanne Havers, MD [facility]    Radene Gunning, MD

## 2022-12-01 NOTE — Progress Notes (Signed)
Patient reports headache this morning rated at a 4. Reports seeing spots this morning when she woke up. Patient states she has noticed an increase in headaches at home with dizziness as well as an increase in BP at home with systolic around [phone].

## 2022-12-02 LAB — CERVICOVAGINAL ANCILLARY ONLY
Bacterial Vaginitis (gardnerella): NEGATIVE
Candida Glabrata: NEGATIVE
Candida Vaginitis: NEGATIVE
Chlamydia: NEGATIVE
Comment: NEGATIVE
Comment: NEGATIVE
Comment: NEGATIVE
Comment: NEGATIVE
Comment: NEGATIVE
Comment: NORMAL
Neisseria Gonorrhea: NEGATIVE
Trichomonas: NEGATIVE

## 2022-12-02 LAB — COMPREHENSIVE METABOLIC PANEL
ALT: 6 IU/L (ref 0–32)
AST: 8 IU/L (ref 0–40)
Albumin/Globulin Ratio: 1.5 (ref 1.2–2.2)
Albumin: 3.5 g/dL — ABNORMAL LOW (ref 4.0–5.0)
Alkaline Phosphatase: 109 IU/L (ref 44–121)
BUN/Creatinine Ratio: 13 (ref 9–23)
BUN: 7 mg/dL (ref 6–20)
Bilirubin Total: <0.2 mg/dL (ref 0.0–1.2)
CO2: 18 mmol/L — ABNORMAL LOW (ref 20–29)
Calcium: 8.9 mg/dL (ref 8.7–10.2)
Chloride: 102 mmol/L (ref 96–106)
Creatinine, Ser: 0.52 mg/dL — ABNORMAL LOW (ref 0.57–1.00)
Globulin, Total: 2.4 g/dL (ref 1.5–4.5)
Glucose: 89 mg/dL (ref 70–99)
Potassium: 3.8 mmol/L (ref 3.5–5.2)
Sodium: 135 mmol/L (ref 134–144)
Total Protein: 5.9 g/dL — ABNORMAL LOW (ref 6.0–8.5)
eGFR: 132 mL/min/{1.73_m2} (ref >59)

## 2022-12-02 LAB — CBC
Hematocrit: 32.6 % — ABNORMAL LOW (ref 34.0–46.6)
Hemoglobin: 10.9 g/dL — ABNORMAL LOW (ref 11.1–15.9)
MCH: 28.1 pg (ref 26.6–33.0)
MCHC: 33.4 g/dL (ref 31.5–35.7)
MCV: 84 fL (ref 79–97)
Platelets: 252 10*3/uL (ref 150–450)
RBC: 3.88 x10E6/uL (ref 3.77–5.28)
RDW: 12.1 % (ref 11.7–15.4)
WBC: 11.8 10*3/uL — ABNORMAL HIGH (ref 3.4–10.8)

## 2022-12-02 LAB — PROTEIN / CREATININE RATIO, URINE
Creatinine, Urine: 54.3 mg/dL
Protein, Ur: 10.5 mg/dL
Protein/Creat Ratio: 193 mg/g creat (ref 0–200)

## 2022-12-08 ENCOUNTER — Ambulatory Visit: Payer: Medicaid Other | Attending: Obstetrics

## 2022-12-08 ENCOUNTER — Ambulatory Visit: Payer: Medicaid Other

## 2022-12-15 ENCOUNTER — Ambulatory Visit: Payer: Medicaid Other

## 2022-12-15 ENCOUNTER — Ambulatory Visit: Payer: Medicaid Other | Attending: Obstetrics

## 2022-12-16 NOTE — Progress Notes (Deleted)
   PRENATAL VISIT NOTE  Subjective:  Erin Krueger is a 27 y.o. [phone] at 34w***d being seen today for ongoing prenatal care.  She is currently monitored for the following issues for this high-risk pregnancy and has PTSD (post-traumatic stress disorder); ADHD (attention deficit hyperactivity disorder), combined type; Psychogenic nonepileptic seizure; H/o child with congenital heart defect; Asthma; Chronic hypertension affecting pregnancy; Seizure disorder during pregnancy, antepartum ([place]); Unspecified mood (affective) disorder ([place]); History of pre-eclampsia in prior pregnancy, currently pregnant; History of eclampsia; Short interval between pregnancies affecting pregnancy, antepartum; History of stillbirth; Obesity in pregnancy; Supervision of high risk pregnancy, antepartum; and Adopted on their problem list.  Patient reports {sx:14538}.   .  .   . Denies leaking of fluid.   The following portions of the patient's history were reviewed and updated as appropriate: allergies, current medications, past family history, past medical history, past social history, past surgical history and problem list.   Objective:  There were no vitals filed for this visit.  Fetal Status:           General:  Alert, oriented and cooperative. Patient is in no acute distress.  Skin: Skin is warm and dry. No rash noted.   Cardiovascular: Normal heart rate noted  Respiratory: Normal respiratory effort, no problems with respiration noted  Abdomen: Soft, gravid, appropriate for gestational age.        Pelvic: {Blank single:19197::"Cervical exam performed in the presence of a chaperone","Cervical exam deferred"}        Extremities: Normal range of motion.     Mental Status: Normal mood and affect. Normal behavior. Normal judgment and thought content.   Assessment and Plan:  Pregnancy: [id number] at 34w***d 1. Supervision of high risk pregnancy, antepartum Reviewed delivery and PNC plans: ***  2. History of  stillbirth Weekly antenatal testing for this reason and d/t CHTN on medications (controlled)  3. Short interval between pregnancies affecting pregnancy, antepartum 1/9 growth normal - 73%ile.   4. History of eclampsia Questionable if eclampsia versus her seizure disorder   5. H/o child with congenital heart defect Normal Echo on 1/18. This pregnancy had possible PACs.   6. Psychogenic nonepileptic seizure Taking her routine home meds.   7. Chronic hypertension affecting pregnancy BP today is *** Recheck of labs recently normal on 1/9 Recommend delivery no later than 39w.    Preterm labor symptoms and general obstetric precautions including but not limited to vaginal bleeding, contractions, leaking of fluid and fetal movement were reviewed in detail with the patient. Please refer to After Visit Summary for other counseling recommendations.   No follow-ups on file.  Future Appointments  Date Time Provider [place]  12/18/2022 10:55 AM Radene Gunning, MD [facility]  12/22/2022 11:15 AM [facility] NURSE [facility]  12/22/2022 11:30 AM [facility] US3 [facility]  12/31/2022  2:35 PM Aletha Halim, MD [facility]  01/08/2023  8:15 AM Radene Gunning, MD [facility]  01/15/2023  8:55 AM Chancy Milroy, MD [facility]  01/22/2023  8:55 AM Anyanwu, Sallyanne Havers, MD [facility]    Radene Gunning, MD

## 2022-12-18 ENCOUNTER — Encounter: Payer: Medicaid Other | Admitting: Obstetrics and Gynecology

## 2022-12-18 DIAGNOSIS — O10919 Unspecified pre-existing hypertension complicating pregnancy, unspecified trimester: Secondary | ICD-10-CM

## 2022-12-18 DIAGNOSIS — O0990 Supervision of high risk pregnancy, unspecified, unspecified trimester: Secondary | ICD-10-CM

## 2022-12-18 DIAGNOSIS — Z8279 Family history of other congenital malformations, deformations and chromosomal abnormalities: Secondary | ICD-10-CM

## 2022-12-18 DIAGNOSIS — Z8759 Personal history of other complications of pregnancy, childbirth and the puerperium: Secondary | ICD-10-CM

## 2022-12-18 DIAGNOSIS — O09899 Supervision of other high risk pregnancies, unspecified trimester: Secondary | ICD-10-CM

## 2022-12-18 DIAGNOSIS — F445 Conversion disorder with seizures or convulsions: Secondary | ICD-10-CM

## 2022-12-22 ENCOUNTER — Ambulatory Visit: Payer: Medicaid Other

## 2022-12-22 ENCOUNTER — Ambulatory Visit: Payer: Medicaid Other | Attending: Obstetrics

## 2022-12-24 ENCOUNTER — Encounter: Payer: Self-pay | Admitting: Family Medicine

## 2022-12-24 ENCOUNTER — Encounter: Payer: Medicaid Other | Admitting: Family Medicine

## 2022-12-24 NOTE — Progress Notes (Signed)
Patient did not keep appointment today. She will be called to reschedule.  

## 2022-12-31 ENCOUNTER — Encounter: Payer: Self-pay | Admitting: Obstetrics and Gynecology

## 2023-01-06 NOTE — Progress Notes (Signed)
   PRENATAL VISIT NOTE  Subjective:  Erin Krueger is a 27 y.o. [phone] at 42w4dbeing seen today for ongoing prenatal care.  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  Patient reports 8 seizures in the last while. Ctxns intermittent. She reports significant decreased FM.  Contractions: Irritability. Vag. Bleeding: Bloody Show.  Movement: (!) Decreased. Denies leaking of fluid.   The following portions of the patient's history were reviewed and updated as appropriate: allergies, current medications, past family history, past medical history, past social history, past surgical history and problem list.   Objective:   Vitals:   01/08/23 0825  BP: (!) 135/112  Pulse: 95  Weight: 231 lb 8 oz (105 kg)    Fetal Status: Fetal Heart Rate (bpm): 145 Fundal Height: 37 cm Movement: (!) Decreased     General:  Alert, oriented and cooperative. Patient is in no acute distress.  Skin: Skin is warm and dry. No rash noted.   Cardiovascular: Normal heart rate noted  Respiratory: Normal respiratory effort, no problems with respiration noted  Abdomen: Soft, gravid, appropriate for gestational age.  Pain/Pressure: Present     Pelvic: Cervical exam performed in the presence of a chaperone Dilation: 2 Effacement (%): 60 Station: -2  Extremities: Normal range of  motion.     Mental Status: Normal mood and affect. Normal behavior. Normal judgment and thought content.   Assessment and Plan:  Pregnancy: [id number] 317w4d. Seizure disorder during pregnancy, antepartum (HAbra[facility] Arizona Heart HospitalTaking her routine home meds. Thought to be psychogenic  2. History of pre-eclampsia in prior pregnancy, currently pregnant BP today is 135/112. Reports elevated BP at home.  She has headaches but they come and go. Not having one currently. No vision changes.   3. History of stillbirth Due for f/u growth and antenatal testing (weekly).  Last growth was on 1/16 and was normal. Cephalic. May be possible to obtain in MAU today. I have notified them.  Will send to MAU for decreased fetal movement.  Recommended for delivery at 39w if no indication for delivery today.   4. Supervision of high risk pregnancy, antepartum [place] [facility] has her medicaid. [facility]WV[facility]consinShe [facility] for that reason until that time.  Cultures today for GBS   Term labor symptoms and general obstetric precautions including but not limited to vaginal bleeding, contractions, leaking of fluid and fetal movement were reviewed in detail with the patient. Please refer to After Visit Summary for other counseling recommendations.   Return in about 1 week (around 01/15/2023) for OB VISIT, MD or APP.  Future Appointments  Date Time Provider DeRandlett2/21/2024  8:55 AM Anyanwu, UgSallyanne HaversMD [facility]3/11/2022  8:55 AM Anyanwu, UgSallyanne HaversMD WMNewton[facility]Well[facility] HospitalMO[facility]  PaRadene GunningMD

## 2023-01-08 ENCOUNTER — Inpatient Hospital Stay (HOSPITAL_COMMUNITY)
Admission: AD | Admit: 2023-01-08 | Discharge: 2023-01-08 | Discharge status: Against Medical Advice | Payer: Medicaid Other | Attending: Obstetrics and Gynecology | Admitting: Obstetrics and Gynecology

## 2023-01-08 ENCOUNTER — Other Ambulatory Visit: Payer: Self-pay

## 2023-01-08 ENCOUNTER — Inpatient Hospital Stay (HOSPITAL_BASED_OUTPATIENT_CLINIC_OR_DEPARTMENT_OTHER): Payer: Medicaid Other

## 2023-01-08 ENCOUNTER — Encounter: Payer: Self-pay | Admitting: Obstetrics and Gynecology

## 2023-01-08 ENCOUNTER — Encounter (HOSPITAL_COMMUNITY): Payer: Self-pay | Admitting: Obstetrics and Gynecology

## 2023-01-08 ENCOUNTER — Other Ambulatory Visit (HOSPITAL_COMMUNITY)
Admission: RE | Admit: 2023-01-08 | Discharge: 2023-01-08 | Disposition: A | Discharge status: Home / Self Care | Payer: Medicaid Other | Source: Ambulatory Visit | Attending: Obstetrics and Gynecology | Admitting: Obstetrics and Gynecology

## 2023-01-08 ENCOUNTER — Ambulatory Visit (INDEPENDENT_AMBULATORY_CARE_PROVIDER_SITE_OTHER): Payer: Medicaid Other | Admitting: Obstetrics and Gynecology

## 2023-01-08 VITALS — BP 135/112 | HR 95 | Wt 231.5 lb

## 2023-01-08 DIAGNOSIS — N3000 Acute cystitis without hematuria: Secondary | ICD-10-CM

## 2023-01-08 DIAGNOSIS — O0990 Supervision of high risk pregnancy, unspecified, unspecified trimester: Secondary | ICD-10-CM

## 2023-01-08 DIAGNOSIS — O368330 Maternal care for abnormalities of the fetal heart rate or rhythm, third trimester, not applicable or unspecified: Secondary | ICD-10-CM

## 2023-01-08 DIAGNOSIS — O09299 Supervision of pregnancy with other poor reproductive or obstetric history, unspecified trimester: Secondary | ICD-10-CM

## 2023-01-08 DIAGNOSIS — O10013 Pre-existing essential hypertension complicating pregnancy, third trimester: Secondary | ICD-10-CM | POA: Diagnosis not present

## 2023-01-08 DIAGNOSIS — Z3A37 37 weeks gestation of pregnancy: Secondary | ICD-10-CM | POA: Insufficient documentation

## 2023-01-08 DIAGNOSIS — O99210 Obesity complicating pregnancy, unspecified trimester: Secondary | ICD-10-CM

## 2023-01-08 DIAGNOSIS — O99213 Obesity complicating pregnancy, third trimester: Secondary | ICD-10-CM | POA: Diagnosis not present

## 2023-01-08 DIAGNOSIS — O0993 Supervision of high risk pregnancy, unspecified, third trimester: Secondary | ICD-10-CM

## 2023-01-08 DIAGNOSIS — E669 Obesity, unspecified: Secondary | ICD-10-CM

## 2023-01-08 DIAGNOSIS — O10919 Unspecified pre-existing hypertension complicating pregnancy, unspecified trimester: Secondary | ICD-10-CM | POA: Diagnosis not present

## 2023-01-08 DIAGNOSIS — O09293 Supervision of pregnancy with other poor reproductive or obstetric history, third trimester: Secondary | ICD-10-CM | POA: Insufficient documentation

## 2023-01-08 DIAGNOSIS — Z5329 Procedure and treatment not carried out because of patient's decision for other reasons: Secondary | ICD-10-CM

## 2023-01-08 DIAGNOSIS — Z8759 Personal history of other complications of pregnancy, childbirth and the puerperium: Secondary | ICD-10-CM

## 2023-01-08 DIAGNOSIS — O09899 Supervision of other high risk pregnancies, unspecified trimester: Secondary | ICD-10-CM

## 2023-01-08 DIAGNOSIS — G40909 Epilepsy, unspecified, not intractable, without status epilepticus: Secondary | ICD-10-CM

## 2023-01-08 DIAGNOSIS — O0943 Supervision of pregnancy with grand multiparity, third trimester: Secondary | ICD-10-CM

## 2023-01-08 DIAGNOSIS — O10913 Unspecified pre-existing hypertension complicating pregnancy, third trimester: Secondary | ICD-10-CM | POA: Insufficient documentation

## 2023-01-08 DIAGNOSIS — O99353 Diseases of the nervous system complicating pregnancy, third trimester: Secondary | ICD-10-CM

## 2023-01-08 LAB — COMPREHENSIVE METABOLIC PANEL
ALT: 9 U/L (ref 0–44)
AST: 15 U/L (ref 15–41)
Albumin: 2.6 g/dL — ABNORMAL LOW (ref 3.5–5.0)
Alkaline Phosphatase: 143 U/L — ABNORMAL HIGH (ref 38–126)
Anion gap: 12 (ref 5–15)
BUN: 7 mg/dL (ref 6–20)
CO2: 20 mmol/L — ABNORMAL LOW (ref 22–32)
Calcium: 8.9 mg/dL (ref 8.9–10.3)
Chloride: 105 mmol/L (ref 98–111)
Creatinine, Ser: 0.63 mg/dL (ref 0.44–1.00)
GFR, Estimated: >60 mL/min (ref >60)
Glucose, Bld: 99 mg/dL (ref 70–99)
Potassium: 3.4 mmol/L — ABNORMAL LOW (ref 3.5–5.1)
Sodium: 137 mmol/L (ref 135–145)
Total Bilirubin: 0.3 mg/dL (ref 0.3–1.2)
Total Protein: 6.4 g/dL — ABNORMAL LOW (ref 6.5–8.1)

## 2023-01-08 LAB — URINALYSIS, ROUTINE W REFLEX MICROSCOPIC
Bilirubin Urine: NEGATIVE
Glucose, UA: NEGATIVE mg/dL
Ketones, ur: NEGATIVE mg/dL
Nitrite: NEGATIVE
Protein, ur: 30 mg/dL — AB
RBC / HPF: >50 RBC/hpf (ref 0–5)
Specific Gravity, Urine: 1.016 (ref 1.005–1.030)
WBC, UA: >50 WBC/hpf (ref 0–5)
pH: 6 (ref 5.0–8.0)

## 2023-01-08 LAB — CBC
HCT: 35 % — ABNORMAL LOW (ref 36.0–46.0)
Hemoglobin: 11.3 g/dL — ABNORMAL LOW (ref 12.0–15.0)
MCH: 27 pg (ref 26.0–34.0)
MCHC: 32.3 g/dL (ref 30.0–36.0)
MCV: 83.5 fL (ref 80.0–100.0)
Platelets: 271 10*3/uL (ref 150–400)
RBC: 4.19 MIL/uL (ref 3.87–5.11)
RDW: 13.2 % (ref 11.5–15.5)
WBC: 11.7 10*3/uL — ABNORMAL HIGH (ref 4.0–10.5)
nRBC: 0 % (ref 0.0–0.2)

## 2023-01-08 LAB — PROTEIN / CREATININE RATIO, URINE
Creatinine, Urine: 132 mg/dL
Protein Creatinine Ratio: 0.2 mg/mg{Cre} — ABNORMAL HIGH (ref 0.00–0.15)
Total Protein, Urine: 26 mg/dL

## 2023-01-08 MED ORDER — CYCLOBENZAPRINE HCL 5 MG PO TABS
10.0000 mg | ORAL_TABLET | Freq: Once | ORAL | Status: AC
  Administered 2023-01-08: 10 mg via ORAL
  Filled 2023-01-08: qty 2

## 2023-01-08 MED ORDER — NIFEDIPINE 10 MG PO CAPS: 20.0000 mg | ORAL_CAPSULE | ORAL | Status: DC | PRN

## 2023-01-08 MED ORDER — NIFEDIPINE 10 MG PO CAPS
10.0000 mg | ORAL_CAPSULE | ORAL | Status: DC | PRN
  Filled 2023-01-08: qty 1

## 2023-01-08 MED ORDER — LABETALOL HCL 5 MG/ML IV SOLN: 40.0000 mg | INTRAVENOUS | Status: DC | PRN

## 2023-01-08 NOTE — Addendum Note (Signed)
Addended by: Radene Gunning A on: 01/08/2023 08:52 AM   Modules accepted: Orders

## 2023-01-08 NOTE — MAU Provider Note (Signed)
History     CSN: [id number]  Arrival date and time: 01/08/23 [id number]   Event Date/Time   First Provider Initiated Contact with Patient 01/08/23 0932      Chief Complaint  Patient presents with   Hypertension   HPI  Erin Krueger is a 27 y.o. [id number] at 40w4dwho presents for evaluation of her blood pressures. Patient reports she was in the office today and had a severe range blood pressure. She also reports a headache. Patient rates the pain as a 8/10 and has not tried anything for the pain. She reports she did take her labetalol as prescribed today. She denies any visual changes or epigastric pain. She denies any vaginal bleeding, discharge, and leaking of fluid. Denies any constipation, diarrhea or any urinary complaints. Reports normal fetal movement.   OB History     Gravida  9   Para  5   Term  3   Preterm  2   AB  3   Living  4      SAB  3   IAB      Ectopic      Multiple  0   Live Births  4           Past Medical History:  Diagnosis Date   ADD (attention deficit disorder)    ADHD (attention deficit hyperactivity disorder)    Adopted person 10/04/2019   Does not know personal family history other than Diabetes, Breast Cancer. Raised in foster care   Allergy    Anxiety    Asthma    Eating disorder    Headache(784.0)    History of eclampsia    History of pre-eclampsia    Kidney stone    kidney stones   Migraines    ODD (oppositional defiant disorder)    PID (acute pelvic inflammatory disease) 01/29/2016   Pregnancy induced hypertension    PTSD (post-traumatic stress disorder)    PTSD (post-traumatic stress disorder)    Seizures (HGascoy[place]    diagnosed age 27   Past Surgical History:  Procedure Laterality Date   DILATION AND CURETTAGE OF UTERUS     right knee surgery     TONSILLECTOMY AND ADENOIDECTOMY      Family History  Adopted: Yes  Problem Relation Age of Onset   Diabetes Sister    Other Daughter        born with hole in heart    Other Son        born with hole in heart   Breast cancer Maternal Grandmother    Diabetes Maternal Grandmother    Glaucoma Maternal Grandmother     Social History   Tobacco Use   Smoking status: Former    Packs/day: 0.50    Types: Cigarettes, E-cigarettes    Quit date: 04/30/2021    Years since quitting: 1.6   Smokeless tobacco: Never   Tobacco comments:    nicotine patch  Vaping Use   Vaping Use: Former   Substances: Flavoring  Substance Use Topics   Alcohol use: Not Currently    Comment: drinks beer on weekends when not pregnant   Drug use: Not Currently    Types: Marijuana, Cocaine    Comment: Not since 2018    Allergies:  Allergies  Allergen Reactions   Bee Venom Anaphylaxis   Cashew Nut Oil Anaphylaxis   Penicillins Anaphylaxis    * Has tolerated several cephalosporins* Has patient had a PCN reaction causing immediate rash, facial/tongue/throat  swelling, SOB or lightheadedness with hypotension: Unknown Has patient had a PCN reaction causing severe rash involving mucus membranes or skin necrosis: Unknown Has patient had a PCN reaction that required hospitalization: Unknown Has patient had a PCN reaction occurring within the last 10 years: Unknown If all of the above answers are "NO", then may proceed with Cephalosporin use.    Ativan [Lorazepam] Other (See Comments)    Aggressive-agitation   Other    Prednisone Other (See Comments)    Bleeding nose bleeding and internal bleeding   Latex Rash    No medications prior to admission.    Review of Systems  Constitutional: Negative.  Negative for fatigue and fever.  HENT: Negative.    Respiratory: Negative.  Negative for shortness of breath.   Cardiovascular: Negative.  Negative for chest pain.  Gastrointestinal: Negative.  Negative for abdominal pain, constipation, diarrhea, nausea and vomiting.  Genitourinary: Negative.  Negative for dysuria.  Neurological:  Positive for headaches. Negative for dizziness.    Physical Exam   Blood pressure 121/81, pulse 83, temperature 98.1 F (36.7 C), temperature source Oral, resp. rate 14, weight 104.8 kg, last menstrual period 04/10/2022, unknown if currently breastfeeding.  Patient Vitals for the past 24 hrs:  BP Temp Temp src Pulse Resp Weight  01/08/23 1107 121/81 -- -- 83 -- --  01/08/23 1102 108/67 -- -- 85 -- --  01/08/23 1050 111/69 -- -- (!) 105 -- --  01/08/23 1037 (!) 135/98 -- -- (!) 118 -- --  01/08/23 1003 132/82 -- -- 89 -- --  01/08/23 0929 (!) 133/115 98.1 F (36.7 C) Oral 90 14 104.8 kg    Physical Exam Vitals and nursing note reviewed.  Constitutional:      General: She is not in acute distress.    Appearance: She is well-developed.  HENT:     Head: Normocephalic.  Eyes:     Pupils: Pupils are equal, round, and reactive to light.  Cardiovascular:     Rate and Rhythm: Normal rate and regular rhythm.     Heart sounds: Normal heart sounds.  Pulmonary:     Effort: Pulmonary effort is normal. No respiratory distress.     Breath sounds: Normal breath sounds.  Abdominal:     General: Bowel sounds are normal. There is no distension.     Palpations: Abdomen is soft.     Tenderness: There is no abdominal tenderness.  Skin:    General: Skin is warm and dry.  Neurological:     Mental Status: She is alert and oriented to person, place, and time.  Psychiatric:        Mood and Affect: Mood normal.        Behavior: Behavior normal.        Thought Content: Thought content normal.        Judgment: Judgment normal.     Fetal Tracing:  Baseline: 140 Variability: moderate Accels:15x15 Decels: none  Toco: none      MAU Course  Procedures  Results for orders placed or performed during the hospital encounter of 01/08/23 (from the past 24 hour(s))  CBC     Status: Abnormal   Collection Time: 01/08/23  9:21 AM  Result Value Ref Range   WBC 11.7 (H) 4.0 - 10.5 K/uL   RBC 4.19 3.87 - 5.11 MIL/uL   Hemoglobin 11.3 (L) 12.0  - 15.0 g/dL   HCT 35.0 (L) 36.0 - 46.0 %   MCV 83.5 80.0 - 100.0 fL  MCH 27.0 26.0 - 34.0 pg   MCHC 32.3 30.0 - 36.0 g/dL   RDW 13.2 11.5 - 15.5 %   Platelets 271 150 - 400 K/uL   nRBC 0.0 0.0 - 0.2 %  Comprehensive metabolic panel     Status: Abnormal   Collection Time: 01/08/23  9:21 AM  Result Value Ref Range   Sodium 137 135 - 145 mmol/L   Potassium 3.4 (L) 3.5 - 5.1 mmol/L   Chloride 105 98 - 111 mmol/L   CO2 20 (L) 22 - 32 mmol/L   Glucose, Bld 99 70 - 99 mg/dL   BUN 7 6 - 20 mg/dL   Creatinine, Ser 0.63 0.44 - 1.00 mg/dL   Calcium 8.9 8.9 - 10.3 mg/dL   Total Protein 6.4 (L) 6.5 - 8.1 g/dL   Albumin 2.6 (L) 3.5 - 5.0 g/dL   AST 15 15 - 41 U/L   ALT 9 0 - 44 U/L   Alkaline Phosphatase 143 (H) 38 - 126 U/L   Total Bilirubin 0.3 0.3 - 1.2 mg/dL   GFR, Estimated >60 >60 mL/min   Anion gap 12 5 - 15  Protein / creatinine ratio, urine     Status: Abnormal   Collection Time: 01/08/23  9:41 AM  Result Value Ref Range   Creatinine, Urine 132 mg/dL   Total Protein, Urine 26 mg/dL   Protein Creatinine Ratio 0.20 (H) 0.00 - 0.15 mg/mg[Cre]  Urinalysis, Routine w reflex microscopic -Urine, Clean Catch     Status: Abnormal   Collection Time: 01/08/23  9:41 AM  Result Value Ref Range   Color, Urine YELLOW YELLOW   APPearance CLOUDY (A) CLEAR   Specific Gravity, Urine 1.016 1.005 - 1.030   pH 6.0 5.0 - 8.0   Glucose, UA NEGATIVE NEGATIVE mg/dL   Hgb urine dipstick LARGE (A) NEGATIVE   Bilirubin Urine NEGATIVE NEGATIVE   Ketones, ur NEGATIVE NEGATIVE mg/dL   Protein, ur 30 (A) NEGATIVE mg/dL   Nitrite NEGATIVE NEGATIVE   Leukocytes,Ua LARGE (A) NEGATIVE   RBC / HPF >50 0 - 5 RBC/hpf   WBC, UA >50 0 - 5 WBC/hpf   Bacteria, UA FEW (A) NONE SEEN   Squamous Epithelial / HPF 6-10 0 - 5 /HPF   Mucus PRESENT    Granular Casts, UA PRESENT    Ca Oxalate Crys, UA PRESENT      [place] MFM OB Follow Up  Result Date:  01/08/2023 ----------------------------------------------------------------------  OBSTETRICS REPORT                       (Signed Final 01/08/2023 11:54 am) ---------------------------------------------------------------------- Patient Info  ID #:       [id number]                          D.O.B.:  Sep 11, 1996 (26 yrs)  Name:       Erin Krueger                 Visit Date: 01/08/2023 09:16 am ---------------------------------------------------------------------- Performed By  Attending:        Johnell Comings MD         Referred By:      [facility] MAU/Triage  Performed By:     Nevin Bloodgood          [facility]  [place] ---------------------------------------------------------------------- Orders  #  Description                           Code        Ordered By  1  [place] MFM OB FOLLOW UP                   [id number]    Merrit Friesen ----------------------------------------------------------------------  #  Order #                     Accession #                Episode #  1  [id number]                   [id number]                 [id number] ---------------------------------------------------------------------- Indications  Poor obstetric history: Previous IUFD          O09.299  (stillbirth)  Previous pregnacy with congenital heart        O09.299  (cardiac) defect (x2)  Hypertension - Chronic/Pre-existing            O10.019  (labetalol)  Obesity complicating pregnancy, third          O99.213  trimester (BMI 30)  Short interval between pregancies, 3rd         O09.893  trimester (delivered 01/25/23)  Grand multiparity, antepartum                  O09.40  [redacted] weeks gestation of pregnancy                Z3A.37  Encounter for other antenatal screening        Z36.2  follow-up  Abnormality fetal heart rate or rhythm, 3rd    O36.833  trimester (PACs?)  Poor obstetric history: Previous               O09.299  preeclampsia / eclampsia/gestational HTN  LR NIPS  ---------------------------------------------------------------------- Fetal Evaluation  Num Of Fetuses:         1  Fetal Heart Rate(bpm):  144  Cardiac Activity:       Observed  Presentation:           Cephalic  Placenta:               Posterior  P. Cord Insertion:      Previously visualized  Amniotic Fluid  AFI FV:      Within normal limits  AFI Sum(cm)     %Tile       Largest Pocket(cm)  8.1             10          4.6  RUQ(cm)       RLQ(cm)       LUQ(cm)        LLQ(cm)  1.2           2.3           4.6            0 ---------------------------------------------------------------------- Biometry  BPD:      96.1  mm  G. Age:  39w 2d         96  %    CI:        82.21   %    70 - 86                                                          FL/HC:      21.5   %    20.9 - 22.7  HC:      334.4  mm     G. Age:  38w 2d         43  %    HC/AC:      0.96        0.92 - 1.05  AC:      347.1  mm     G. Age:  38w 4d         88  %    FL/BPD:     74.9   %    71 - 87  FL:         72  mm     G. Age:  36w 6d         33  %    FL/AC:      20.7   %    20 - 24  Est. FW:    3443  gm      7 lb 9 oz     77  % ---------------------------------------------------------------------- OB History  Gravidity:    9         Term:   3        Prem:   2        SAB:   3  TOP:          0       Ectopic:  0        Living: 4 ---------------------------------------------------------------------- Gestational Age  LMP:           39w 0d        Date:  04/10/22                  EDD:   01/15/23  U/S Today:     38w 2d                                        EDD:   01/20/23  Best:          37w 4d     Det. ByLoman Chroman         EDD:   01/25/23                                      (06/02/22) ---------------------------------------------------------------------- Anatomy  Cranium:               Appears normal         LVOT:                   Previously seen  Cavum:                 Previously  Aortic Arch:            Previously seen                          visualized  Ventricles:            Previously seen        Ductal Arch:            Possible narrowing                                                                        prev seen  Choroid Plexus:        Previously seen        Diaphragm:              Appears normal  Cerebellum:            Previously seen        Stomach:                Appears normal, left                                                                        sided  Posterior Fossa:       Previously seen        Abdomen:                Previously seen  Nuchal Fold:           Not applicable ([id number]    Abdominal Wall:         Previously seen                         wks GA)  Face:                  Orbits and profile     Cord Vessels:           Previously seen                         previously seen  Lips:                  Previously seen        Kidneys:                Appear normal  Palate:                Not well visualized    Bladder:                Appears normal  Thoracic:              Previously seen        Spine:                  Previously seen  Heart:  Previously seen        Upper Extremities:      Previously seen  RVOT:                  Previously seen        Lower Extremities:      Previously seen  Other:  Female gender previously seen. Heels/feet, open hands/5th digits,          Nasal bone, lenses, maxilla, mandible, falx, VC, 3VTV, 3VV          previously visualized. Technically difficult due to fetal position &          advanced GA. ---------------------------------------------------------------------- Cervix Uterus Adnexa  Cervix  Not visualized (advanced GA >24wks)  Uterus  No abnormality visualized.  Right Ovary  Not visualized.  Left Ovary  Not visualized.  Cul De Sac  No free fluid seen.  Adnexa  No abnormality visualized ---------------------------------------------------------------------- Comments  This patient was seen in the MAU due to elevated blood  pressures and history of stillbirth.  On today's exam, the overall  EFW of 7 pounds 9 ounces  measures at the [place] percentile for her gestational age.  There was normal amniotic fluid noted.  The fetus was in the vertex presentation. ----------------------------------------------------------------------                   Johnell Comings, MD Electronically Signed Final Report   01/08/2023 11:54 am ----------------------------------------------------------------------    MDM Labs ordered and reviewed.   UA CBC, CMP, Protein/creat ratio Flexeril PO [place] MFM Follow Up  CNM consulted with Dr. Mikel Cella regarding presentation and results- MD recommends IOL for worsening CHTN.   Discussed recommendation with patient and partner. Patient verbalized understanding and agreeable to delivery but states she has to deliver in [place]. She states that is where her mother is and she needs to be induced at the hospital where her mother can come.   CNM validated feelings and dicussed that if patient leaves it will be AGAINST MEDICAL ADVICE. CNM discs used risks of worsening HTN including preeclampsia, eclampsia, maternal and fetal death. Patient verbalized understanding and states she will go to the [facility] for delivery.  Assessment and Plan   1. History of pre-eclampsia in prior pregnancy, currently pregnant   2. Chronic hypertension affecting pregnancy   3. Left against medical advice   4. [redacted] weeks gestation of pregnancy   5. History of eclampsia   6. Short interval between pregnancies affecting pregnancy, antepartum   7. History of stillbirth   8. Obesity in pregnancy    -Patient [place], [place] 01/08/2023, 9:32 AM

## 2023-01-08 NOTE — MAU Note (Signed)
Patient requesting to leave AMA because she wants to deliver in [place], [place] because that is where her mother lives. Risks reviewed with patient and AMA form signed.

## 2023-01-08 NOTE — MAU Note (Signed)
.  Erin Krueger is a 27 y.o. at 42w4dhere in MAU reporting: sent over from the office for elevated BP and [place] Has been having headaches, and dizzy spells. Also has had an increase in seizures in the last 2 weeks. Denies VB or LOF. +FM. Also having some lower back pain.   Pain score: 4 FHT:152 with audible arrhythmia

## 2023-01-10 LAB — CULTURE, OB URINE: Culture: >=100000 — AB

## 2023-01-11 ENCOUNTER — Telehealth: Payer: Self-pay | Admitting: Family Medicine

## 2023-01-11 LAB — CERVICOVAGINAL ANCILLARY ONLY
Bacterial Vaginitis (gardnerella): NEGATIVE
Candida Glabrata: NEGATIVE
Candida Vaginitis: NEGATIVE
Chlamydia: NEGATIVE
Comment: NEGATIVE
Comment: NEGATIVE
Comment: NEGATIVE
Comment: NEGATIVE
Comment: NEGATIVE
Comment: NORMAL
Neisseria Gonorrhea: NEGATIVE
Trichomonas: NEGATIVE

## 2023-01-11 LAB — CULTURE, BETA STREP (GROUP B ONLY): Strep Gp B Culture: NEGATIVE

## 2023-01-11 NOTE — Telephone Encounter (Signed)
Called patient stating I am returning her phone. Patient states she was seen in MAU on 2/16 and was advised to be induced. She states she went to [place] to deliver and was induced there. She said during labor they broke her water and the cord came out so she had an emergency c-section on 2/17. Patient states they prescribed her oxycodone but [facility] is unable to process Rx because the provider isn't enrolled in [facility] Medicaid. Discussed with Dr Damita Dunnings who states the Rx can be transferred to a [facility] then the patient can pay for it with goodRx coupon OR patient can come in for a nurse visit with records then we could prescribe something. Discussed with patient who would like an appt with [place]. Offered nurse visit appt tomorrow at 2:30pm and patient verbalized understanding.

## 2023-01-11 NOTE — Telephone Encounter (Signed)
Patient want to talk to somebody about getting a Rx for Oxycodone

## 2023-01-12 ENCOUNTER — Other Ambulatory Visit: Payer: Self-pay

## 2023-01-12 ENCOUNTER — Ambulatory Visit (INDEPENDENT_AMBULATORY_CARE_PROVIDER_SITE_OTHER): Payer: Medicaid Other

## 2023-01-12 VITALS — BP 123/89 | HR 80 | Wt 223.0 lb

## 2023-01-12 DIAGNOSIS — Z98891 History of uterine scar from previous surgery: Secondary | ICD-10-CM

## 2023-01-12 DIAGNOSIS — Z5189 Encounter for other specified aftercare: Secondary | ICD-10-CM

## 2023-01-12 NOTE — Progress Notes (Signed)
Incision Check Visit  Erin Krueger is here for incision check following primary c-section on 01/09/23 in [place], [place]. Seen at our office for prenatal care, but delivered in [place] due to need to be close to family for delivery.   Assessment: Incision is clean, dry, and well healing.   Education: Reviewed good wound care and s/s of infection with patient. Reviewed good pain control with ibuprofen and Tylenol on a regular schedule; currently taking every 8 hours as instructed by [name]  Autry-Lott, DO to bedside for exam and recommends short course of oxycodone for additional pain relief as pain is not fully controlled by Tylenol and ibuprofen. Patient will follow up at post partum visit .  Marjo Bicker, RN 01/12/2023  3:06 PM

## 2023-01-13 ENCOUNTER — Encounter: Payer: Medicaid Other | Admitting: Obstetrics & Gynecology

## 2023-01-14 LAB — URINE CULTURE, OB REFLEX

## 2023-01-14 LAB — CULTURE, OB URINE

## 2023-01-14 MED ORDER — CEFADROXIL 500 MG PO CAPS: 500.0000 mg | ORAL_CAPSULE | Freq: Two times a day (BID) | ORAL | 0 refills | Status: AC

## 2023-01-14 MED ORDER — OXYCODONE HCL 5 MG PO TABS: 5.0000 mg | ORAL_TABLET | ORAL | 0 refills | Status: AC | PRN

## 2023-01-14 NOTE — Addendum Note (Signed)
Addended by: Caralee Ates on: 01/14/2023 08:21 AM   Modules accepted: Orders

## 2023-01-14 NOTE — Addendum Note (Signed)
Addended by: Radene Gunning A on: 01/14/2023 08:40 PM   Modules accepted: Orders

## 2023-01-15 ENCOUNTER — Encounter: Payer: Self-pay | Admitting: Obstetrics and Gynecology

## 2023-01-22 ENCOUNTER — Encounter: Payer: Self-pay | Admitting: Obstetrics & Gynecology

## 2023-02-11 ENCOUNTER — Other Ambulatory Visit: Payer: Self-pay

## 2023-02-11 ENCOUNTER — Ambulatory Visit (INDEPENDENT_AMBULATORY_CARE_PROVIDER_SITE_OTHER): Payer: Medicaid Other | Admitting: Family Medicine

## 2023-02-11 ENCOUNTER — Encounter: Payer: Self-pay | Admitting: Family Medicine

## 2023-02-11 DIAGNOSIS — Z98891 History of uterine scar from previous surgery: Secondary | ICD-10-CM | POA: Diagnosis not present

## 2023-02-11 DIAGNOSIS — Z8759 Personal history of other complications of pregnancy, childbirth and the puerperium: Secondary | ICD-10-CM

## 2023-02-11 DIAGNOSIS — Z30011 Encounter for initial prescription of contraceptive pills: Secondary | ICD-10-CM | POA: Diagnosis not present

## 2023-02-11 DIAGNOSIS — O1093 Unspecified pre-existing hypertension complicating the puerperium: Secondary | ICD-10-CM

## 2023-02-11 MED ORDER — NORETHINDRONE 0.35 MG PO TABS: 1.0000 | ORAL_TABLET | Freq: Every day | ORAL | 11 refills | Status: DC

## 2023-02-11 MED ORDER — AMLODIPINE BESYLATE 5 MG PO TABS: 5.0000 mg | ORAL_TABLET | Freq: Every day | ORAL | 1 refills | Status: DC

## 2023-02-11 NOTE — Progress Notes (Signed)
Post Partum Visit Note  Erin Krueger is a 27 y.o. [phone] female who presents for a postpartum visit. She is 4 weeks postpartum following a primary cesarean section.  I have fully reviewed the prenatal and intrapartum course. The delivery was at 42/3 gestational weeks.  Anesthesia: epidural. Postpartum course has been ok overall. BP still elevated at home, about 130s/90s. Baby is doing well. Baby is feeding by both breast and bottle - [employer] Good Start. Bleeding no bleeding. Bowel function is normal. Bladder function is normal. Patient is not sexually active. Contraception method is none. Postpartum depression screening: negative.  LMP: states she had an interval when lochia had stopped for about 4 days. And then had menstrual bleeding 3/8 - 3/19. She states that her periods were heavy and long prior to pregnancy.  H/aches - started in the last few days, intermittent, around her temple and behind her eyes.    The pregnancy intention screening data noted above was reviewed. Potential methods of contraception were discussed. The patient elected to proceed with No data recorded.   Edinburgh Postnatal Depression Scale - 02/11/23 1333       Edinburgh Postnatal Depression Scale:  In the Past 7 Days   I have been able to laugh and see the funny side of things. 0    I have looked forward with enjoyment to things. 0    I have blamed myself unnecessarily when things went wrong. 0    I have been anxious or worried for no good reason. 0    I have felt scared or panicky for no good reason. 0    Things have been getting on top of me. 1    I have been so unhappy that I have had difficulty sleeping. 0    I have felt sad or miserable. 0    I have been so unhappy that I have been crying. 0    The thought of harming myself has occurred to me. 0    Edinburgh Postnatal Depression Scale Total 1             There are no preventive care reminders to display for this patient.  The following  portions of the patient's history were reviewed and updated as appropriate: allergies, current medications, past family history, past medical history, past social history, past surgical history, and problem list.  Review of Systems A comprehensive review of systems was negative.  Objective:  BP (!) 135/97   Pulse 79   Ht 5\' 6"  (1.676 m)   Wt 218 lb 14.4 oz (99.3 kg)   LMP 04/10/2022   Breastfeeding Yes Comment: Bottle also  BMI 35.33 kg/m    General:  alert, cooperative, and appears stated age   Breasts:  not indicated  Lungs: No increased work of breathing  Heart:  regular rate and rhythm  Abdomen: Soft, non tenders.     Wound C/S scar is well healed. Mild erythema noted around the incision, but no concerns for infection.  GU exam:  not indicated       Assessment:   Encounter for postpartum visit Doing well overall  History of severe pre-eclampsia Pre-eclampsia superimposed on chronic hypertension, postpartum Suspect intermittent h/aches are due to high blood pressure. Less likely to be having severe features of pre-eclampsia.  - Plan: amLODipine (NORVASC) 5 MG tablet - recommend follow up with PCP in 1 month. She is able to set up an appointment - recommend follow up in [facility] if blood  pressure numbers worsen, or having symptoms worrisome for pre-eclampsia.  Status post emergency cesarean section Doing good. Incision site is well healed. Recommended keeping clean, dry and aerated to reduce the risk   Encounter for initial prescription of contraceptive pills - Plan: norethindrone (MICRONOR) 0.35 MG tablet    Plan:   Essential components of care per ACOG recommendations:  1.  Mood and well being: Patient with negative depression screening today. Reviewed local resources for support.  - Patient tobacco use? No.   - hx of drug use? No.    2. Infant care and feeding:  -Patient currently breastmilk feeding? Yes. Reviewed importance of draining breast regularly to support  lactation.  -Social determinants of health (SDOH) reviewed in EPIC. No concerns  3. Sexuality, contraception and birth spacing - Patient does not want a pregnancy in the next year.  Desired family size is 5 children.  - Reviewed reproductive life planning. Reviewed contraceptive methods based on pt preferences and effectiveness.  Patient desired Oral Contraceptive today.   - Discussed birth spacing of 18 months  4. Sleep and fatigue -Encouraged family/partner/community support of 4 hrs of uninterrupted sleep to help with mood and fatigue  5. Physical Recovery  - Discussed patients delivery and complications. She describes her labor as mixed. - Patient had a C-section emergent. Patient had a  no  laceration. Patient expressed understanding - Patient has urinary incontinence? No. - Patient is safe to resume physical and sexual activity in 2 weeks, and when she feels ready  6.  Health Maintenance - HM due items addressed Yes - Last pap smear  Diagnosis  Date Value Ref Range Status  08/12/2021   Final   - Negative for Intraepithelial Lesions or Malignancy (NILM)  08/12/2021 - Benign reactive/reparative changes  Final   Pap smear not done at today's visit.  -Breast Cancer screening indicated? No.   7. Chronic Disease/Pregnancy Condition follow up: Hypertension as above  - PCP follow up  Liliane Channel MD MPH OB Fellow, [place] [place] 02/11/2023

## 2023-08-17 ENCOUNTER — Other Ambulatory Visit: Payer: Medicaid Other

## 2023-08-24 ENCOUNTER — Other Ambulatory Visit: Payer: Self-pay | Admitting: Adult Health

## 2023-08-24 ENCOUNTER — Other Ambulatory Visit (INDEPENDENT_AMBULATORY_CARE_PROVIDER_SITE_OTHER): Payer: Medicaid Other | Admitting: *Deleted

## 2023-08-24 VITALS — BP 115/74 | HR 74 | Ht 66.0 in | Wt 228.0 lb

## 2023-08-24 DIAGNOSIS — Z3201 Encounter for pregnancy test, result positive: Secondary | ICD-10-CM | POA: Diagnosis not present

## 2023-08-24 DIAGNOSIS — N926 Irregular menstruation, unspecified: Secondary | ICD-10-CM

## 2023-08-24 LAB — POCT URINE PREGNANCY: Preg Test, Ur: POSITIVE — AB

## 2023-08-24 MED ORDER — ONDANSETRON HCL 4 MG PO TABS: 4.0000 mg | ORAL_TABLET | Freq: Three times a day (TID) | ORAL | 1 refills | Status: DC | PRN

## 2023-08-24 NOTE — Progress Notes (Signed)
-  Rx. [email or web address] 

## 2023-08-24 NOTE — Progress Notes (Signed)
   NURSE VISIT- PREGNANCY CONFIRMATION   SUBJECTIVE:  Erin Krueger is a 27 y.o. [id number] female at [redacted]w[redacted]d by certain LMP of Patient's last menstrual period was 07/19/2023. Here for pregnancy confirmation.  Home pregnancy test: positive x 6   She reports nausea and vomiting.  She is taking prenatal vitamins.    OBJECTIVE:  BP 115/74 (BP Location: Left Arm, Patient Position: Sitting, Cuff Size: Normal)   Pulse 74   Ht 5\' 6"  (1.676 m)   Wt 228 lb (103.4 kg)   LMP 07/19/2023   Breastfeeding No   BMI 36.80 kg/m   Appears well, in no apparent distress  Results for orders placed or performed in visit on 08/24/23 (from the past 24 hour(s))  POCT urine pregnancy   Collection Time: 08/24/23  3:16 PM  Result Value Ref Range   Preg Test, Ur Positive (A) Negative    ASSESSMENT: Positive pregnancy test, [redacted]w[redacted]d by LMP    PLAN: Schedule for dating ultrasound in 3 weeks Prenatal vitamins: continue   Nausea medicines: requested-note routed to [facility]  to send prescription   OB packet given: Yes  Annamarie Dawley  08/24/2023 3:22 PM

## 2023-09-11 ENCOUNTER — Other Ambulatory Visit: Payer: Self-pay | Admitting: Obstetrics & Gynecology

## 2023-09-11 DIAGNOSIS — O99210 Obesity complicating pregnancy, unspecified trimester: Secondary | ICD-10-CM

## 2023-09-11 DIAGNOSIS — Z8759 Personal history of other complications of pregnancy, childbirth and the puerperium: Secondary | ICD-10-CM

## 2023-09-11 DIAGNOSIS — Z3A08 8 weeks gestation of pregnancy: Secondary | ICD-10-CM

## 2023-09-11 DIAGNOSIS — Z3A01 Less than 8 weeks gestation of pregnancy: Secondary | ICD-10-CM

## 2023-09-11 DIAGNOSIS — O09291 Supervision of pregnancy with other poor reproductive or obstetric history, first trimester: Secondary | ICD-10-CM

## 2023-09-14 ENCOUNTER — Other Ambulatory Visit: Payer: Medicaid Other | Admitting: Radiology

## 2023-09-20 ENCOUNTER — Other Ambulatory Visit: Payer: Self-pay | Admitting: Obstetrics & Gynecology

## 2023-09-20 DIAGNOSIS — Z3A09 9 weeks gestation of pregnancy: Secondary | ICD-10-CM

## 2023-09-20 DIAGNOSIS — O99210 Obesity complicating pregnancy, unspecified trimester: Secondary | ICD-10-CM

## 2023-09-20 DIAGNOSIS — O3680X0 Pregnancy with inconclusive fetal viability, not applicable or unspecified: Secondary | ICD-10-CM

## 2023-09-20 DIAGNOSIS — O09291 Supervision of pregnancy with other poor reproductive or obstetric history, first trimester: Secondary | ICD-10-CM

## 2023-09-20 DIAGNOSIS — Z8759 Personal history of other complications of pregnancy, childbirth and the puerperium: Secondary | ICD-10-CM

## 2023-09-21 ENCOUNTER — Other Ambulatory Visit: Payer: Medicaid Other | Admitting: Radiology

## 2023-09-21 ENCOUNTER — Other Ambulatory Visit: Payer: Medicaid Other

## 2023-09-21 ENCOUNTER — Other Ambulatory Visit: Payer: Self-pay | Admitting: Obstetrics & Gynecology

## 2023-09-21 DIAGNOSIS — O3680X0 Pregnancy with inconclusive fetal viability, not applicable or unspecified: Secondary | ICD-10-CM

## 2023-09-21 DIAGNOSIS — Z3490 Encounter for supervision of normal pregnancy, unspecified, unspecified trimester: Secondary | ICD-10-CM

## 2023-09-21 DIAGNOSIS — O09291 Supervision of pregnancy with other poor reproductive or obstetric history, first trimester: Secondary | ICD-10-CM

## 2023-09-21 DIAGNOSIS — Z8759 Personal history of other complications of pregnancy, childbirth and the puerperium: Secondary | ICD-10-CM

## 2023-09-21 DIAGNOSIS — Z3A09 9 weeks gestation of pregnancy: Secondary | ICD-10-CM | POA: Diagnosis not present

## 2023-09-21 DIAGNOSIS — O99210 Obesity complicating pregnancy, unspecified trimester: Secondary | ICD-10-CM | POA: Diagnosis not present

## 2023-09-21 NOTE — Progress Notes (Addendum)
[place] 9+1 by LMP   Hx habitual aborter, SABx3 TA and TV imaging performed:   Chaperone: Peggy  GS = 19mm = 6+6 weeks   size < dates Avascular gestational sac,  GS appears intact with two small circular shaped structures: .45mm and .58 mm ? Two yolk sacs  -  no evidence of embryonic pole  -  suspect early MAB Needs F/U Normal ovaries -  neg adnexal regions - neg CDS  -  no free fluid

## 2023-09-30 ENCOUNTER — Other Ambulatory Visit: Payer: Self-pay | Admitting: Obstetrics & Gynecology

## 2023-09-30 DIAGNOSIS — O3680X0 Pregnancy with inconclusive fetal viability, not applicable or unspecified: Secondary | ICD-10-CM

## 2023-09-30 LAB — BETA HCG QUANT (REF LAB): hCG Quant: 19706 m[IU]/mL

## 2023-10-01 ENCOUNTER — Ambulatory Visit (INDEPENDENT_AMBULATORY_CARE_PROVIDER_SITE_OTHER): Payer: Medicaid Other | Admitting: Obstetrics & Gynecology

## 2023-10-01 ENCOUNTER — Encounter: Payer: Self-pay | Admitting: Obstetrics & Gynecology

## 2023-10-01 ENCOUNTER — Other Ambulatory Visit: Payer: Self-pay | Admitting: Obstetrics & Gynecology

## 2023-10-01 ENCOUNTER — Ambulatory Visit (INDEPENDENT_AMBULATORY_CARE_PROVIDER_SITE_OTHER): Payer: Medicaid Other

## 2023-10-01 VITALS — BP 123/82 | HR 74 | Ht 66.0 in | Wt 220.0 lb

## 2023-10-01 DIAGNOSIS — Z3A00 Weeks of gestation of pregnancy not specified: Secondary | ICD-10-CM

## 2023-10-01 DIAGNOSIS — Z3A01 Less than 8 weeks gestation of pregnancy: Secondary | ICD-10-CM

## 2023-10-01 DIAGNOSIS — O3680X0 Pregnancy with inconclusive fetal viability, not applicable or unspecified: Secondary | ICD-10-CM | POA: Diagnosis not present

## 2023-10-01 DIAGNOSIS — O209 Hemorrhage in early pregnancy, unspecified: Secondary | ICD-10-CM | POA: Diagnosis not present

## 2023-10-01 NOTE — Progress Notes (Signed)
US TV 5+6 wks,single IUP with small yolk sac,no fetal heart tones,gestational sac 24.7 mm,normal ovaries

## 2023-10-01 NOTE — Progress Notes (Signed)
   GYN VISIT Patient name: Erin Krueger MRN [id number]  Date of birth: 1995/12/09 Chief Complaint:   Follow-up ([place] today)  History of Present Illness:   Erin Krueger is a 27 y.o. [id number] female being seen today for follow up US.  -She has noted some light spotting, but no cramping.  Per pt she had similar bleeding in past pregnancy consistent with implantation bleeding.  Denies fever/chills.  No other acute complaints.  Patient's last menstrual period was 07/19/2023.    Review of Systems:   Pertinent items are noted in HPI Denies fever/chills, dizziness, headaches, visual disturbances, fatigue, shortness of breath, chest pain, abdominal pain, vomiting, no problems with bowel movements, urination, or intercourse unless otherwise stated above.  Pertinent History Reviewed:   Past Surgical History:  Procedure Laterality Date   DILATION AND CURETTAGE OF UTERUS     right knee surgery     TONSILLECTOMY AND ADENOIDECTOMY      Past Medical History:  Diagnosis Date   ADD (attention deficit disorder)    ADHD (attention deficit hyperactivity disorder)    Adopted person 10/04/2019   Does not know personal family history other than Diabetes, Breast Cancer. Raised in foster care   Allergy    Anxiety    Asthma    Eating disorder    Headache(784.0)    History of eclampsia    History of pre-eclampsia    Kidney stone    kidney stones   Migraines    ODD (oppositional defiant disorder)    PID (acute pelvic inflammatory disease) 01/29/2016   Pregnancy induced hypertension    PTSD (post-traumatic stress disorder)    PTSD (post-traumatic stress disorder)    Seizures (HCC)    diagnosed age 28   Reviewed problem list, medications and allergies. Physical Assessment:   Vitals:   10/01/23 1225  BP: 123/82  Pulse: 74  Weight: 220 lb (99.8 kg)  Height: 5\' 6"  (1.676 m)  Body mass index is 35.51 kg/m.       Physical Examination:   General appearance: alert, well appearing, and in  no distress  Psych: mood appropriate, normal affect  Skin: warm & dry   Cardiovascular: normal heart rate noted  Respiratory: normal respiratory effort, no distress  Abdomen: soft, non-tender   Pelvic: examination not indicated  Extremities: no edema   [place] today: single IUP with small yolk sac,no fetal heart tones,gestational sac 24.7 mm,normal ovaries   Chaperone: N/A    Assessment & Plan:  1) early miscarriage vs failed pregnancy -Reviewed prior US, which was less than 11 days ago -based on today's imaging concern for miscarriage, but cannot definitely confirm as it does not quite meet criteria -reviewed miscarriage as well as septic precautions -plan to repeat US no sooner than 10 days from last [place] -questions/concerns were addressed   No orders of the defined types were placed in this encounter.   Return for reschedule early [place] 11/20 or 11/21.   Myna Hidalgo, DO Attending Obstetrician & Gynecologist, [facility] for [employer], [facility]

## 2023-10-04 ENCOUNTER — Encounter: Payer: Self-pay | Admitting: Obstetrics & Gynecology

## 2023-10-12 ENCOUNTER — Other Ambulatory Visit: Payer: Self-pay | Admitting: Obstetrics & Gynecology

## 2023-10-12 DIAGNOSIS — O3680X1 Pregnancy with inconclusive fetal viability, fetus 1: Secondary | ICD-10-CM

## 2023-10-12 DIAGNOSIS — Z3682 Encounter for antenatal screening for nuchal translucency: Secondary | ICD-10-CM

## 2023-10-13 ENCOUNTER — Ambulatory Visit: Payer: Medicaid Other | Admitting: Adult Health

## 2023-10-13 ENCOUNTER — Ambulatory Visit (INDEPENDENT_AMBULATORY_CARE_PROVIDER_SITE_OTHER): Payer: Medicaid Other

## 2023-10-13 ENCOUNTER — Encounter: Payer: Self-pay | Admitting: Adult Health

## 2023-10-13 VITALS — BP 124/78 | HR 76 | Ht 66.0 in | Wt 222.0 lb

## 2023-10-13 DIAGNOSIS — Z3A00 Weeks of gestation of pregnancy not specified: Secondary | ICD-10-CM

## 2023-10-13 DIAGNOSIS — O3680X1 Pregnancy with inconclusive fetal viability, fetus 1: Secondary | ICD-10-CM | POA: Diagnosis not present

## 2023-10-13 DIAGNOSIS — O039 Complete or unspecified spontaneous abortion without complication: Secondary | ICD-10-CM | POA: Diagnosis not present

## 2023-10-13 DIAGNOSIS — Z3A12 12 weeks gestation of pregnancy: Secondary | ICD-10-CM | POA: Diagnosis not present

## 2023-10-13 MED ORDER — PROMETHAZINE HCL 25 MG PO TABS
25.0000 mg | ORAL_TABLET | Freq: Four times a day (QID) | ORAL | 1 refills | Status: DC | PRN
Start: 2023-10-13 — End: 2024-07-20

## 2023-10-13 MED ORDER — MISOPROSTOL 200 MCG PO TABS: ORAL_TABLET | ORAL | 1 refills | Status: DC

## 2023-10-13 NOTE — Progress Notes (Signed)
US TV: 5+6 wks,single IUP,no fetal heart tones,CRL 2.78 mm,gestational sac 24.5 mm,small yolk sac,N/C from prior ultrasound on 10/01/2023

## 2023-10-13 NOTE — Progress Notes (Signed)
Subjective:     Patient ID: Erin Krueger, female   DOB: 1996-05-10, 27 y.o.   MRN: [id number]  HPI Zniyah is a 27 year old white female, married, [id number] in for follow up after [place]. She had first [place] 09/21/23 no FHM seen, and S<D, [place] was repeated 11/8, no growth or FHM, seen, QHCG was 19/706 09/29/23. She says she went to [facility] 10/04/23 and US showed no FHM and QHGC had dropped to 14,365. [place] today showed nor growth 5+6 weeks and NO FHM today. Blood type is O+     Component Value Date/Time   DIAGPAP  08/12/2021 1512    - Negative for Intraepithelial Lesions or Malignancy (NILM)   DIAGPAP - Benign reactive/reparative changes 08/12/2021 1512   DIAGPAP  04/05/2018 0000    -  NEGATIVE FOR INTRAEPITHELIAL LESIONS OR MALIGNANCY   DIAGPAP -  BENIGN REACTIVE/REPARATIVE CHANGES 04/05/2018 0000   ADEQPAP  08/12/2021 1512    Satisfactory for evaluation; transformation zone component PRESENT.   ADEQPAP  04/05/2018 0000    Satisfactory for evaluation  endocervical/transformation zone component PRESENT.    PCP is [facility]  Review of Systems Has had bleeding on and off and cramping Feels dizzy at times Reviewed past medical,surgical, social and family history. Reviewed medications and allergies.     Objective:   Physical Exam BP 124/78 (BP Location: Right Arm, Patient Position: Sitting, Cuff Size: Normal)   Pulse 76   Ht 5\' 6"  (1.676 m)   Wt 222 lb (100.7 kg)   LMP 07/19/2023   Breastfeeding No   BMI 35.83 kg/m     Skin warm and dry.  Lungs: clear to ausculation bilaterally. Cardiovascular: regular rate and rhythm.  Reviewed [place] with her: no FHM, no growth, CRL 2.8 mm about 5+6 weeks   Upstream - 10/13/23 1139       Pregnancy Intention Screening   Does the patient want to become pregnant in the next year? N/A    Does the patient's partner want to become pregnant in the next year? N/A    Would the patient like to discuss contraceptive options today? N/A      Contraception  Wrap Up   Current Method Pregnant/Seeking Pregnancy    End Method Pregnant/Seeking Pregnancy    Contraception Counseling Provided No             Assessment:     1. Miscarriage No FHM Bleeding on and off Dizzy at times Will check labs Take PNV Push fluids Discussed options of waiting and watching or Cytotec and she wants Cytotec Take 4 today and repeat in 48 hours if needed  Will rx phenergan for nausea  Can take tylenol and advil together for pain if needed  Meds ordered this encounter  Medications   misoprostol (CYTOTEC) 200 MCG tablet    Sig: Take 4 po today and can repeat in 48 hours if needed    Dispense:  4 tablet    Refill:  1    Order Specific Question:   Supervising Provider    Answer:   Duane Lope H [[id number]]   promethazine (PHENERGAN) 25 MG tablet    Sig: Take 1 tablet (25 mg total) by mouth every 6 (six) hours as needed for nausea or vomiting.    Dispense:  30 tablet    Refill:  1    Order Specific Question:   Supervising Provider    Answer:   Duane Lope H [[id number]]    - CBC -  Beta hCG quant (ref lab)      Plan:     Follow up in 1 week for ROS and repeat [facility]

## 2023-10-14 LAB — CBC
Hematocrit: 39.3 % (ref 34.0–46.6)
Hemoglobin: 12.5 g/dL (ref 11.1–15.9)
MCH: 27.7 pg (ref 26.6–33.0)
MCHC: 31.8 g/dL (ref 31.5–35.7)
MCV: 87 fL (ref 79–97)
Platelets: 271 10*3/uL (ref 150–450)
RBC: 4.51 x10E6/uL (ref 3.77–5.28)
RDW: 13.3 % (ref 11.7–15.4)
WBC: 7.5 10*3/uL (ref 3.4–10.8)

## 2023-10-14 LAB — BETA HCG QUANT (REF LAB): hCG Quant: 3899 m[IU]/mL

## 2023-10-19 ENCOUNTER — Encounter: Payer: Self-pay | Admitting: Adult Health

## 2023-10-19 ENCOUNTER — Ambulatory Visit: Payer: Medicaid Other | Admitting: Adult Health

## 2023-10-19 VITALS — BP 124/88 | HR 83 | Ht 66.0 in | Wt 221.5 lb

## 2023-10-19 DIAGNOSIS — Z3A00 Weeks of gestation of pregnancy not specified: Secondary | ICD-10-CM

## 2023-10-19 DIAGNOSIS — O039 Complete or unspecified spontaneous abortion without complication: Secondary | ICD-10-CM | POA: Diagnosis not present

## 2023-10-19 NOTE — Progress Notes (Signed)
  Subjective:     Patient ID: Erin Krueger, female   DOB: 15-Aug-1996, 27 y.o.   MRN: [id number]  HPI Erin Krueger is a 27 year old white female, married, [id number] back in follow up on taking Cytotec, x 2 passed tissue Sunday and bleeding has slowed, she forgot to bring tissue.     Component Value Date/Time   DIAGPAP  08/12/2021 1512    - Negative for Intraepithelial Lesions or Malignancy (NILM)   DIAGPAP - Benign reactive/reparative changes 08/12/2021 1512   DIAGPAP  04/05/2018 0000    -  NEGATIVE FOR INTRAEPITHELIAL LESIONS OR MALIGNANCY   DIAGPAP -  BENIGN REACTIVE/REPARATIVE CHANGES 04/05/2018 0000   ADEQPAP  08/12/2021 1512    Satisfactory for evaluation; transformation zone component PRESENT.   ADEQPAP  04/05/2018 0000    Satisfactory for evaluation  endocervical/transformation zone component PRESENT.   PCP is [facility].  Review of Systems Bleeding has slowed Denies any pain Not having sex Reviewed past medical,surgical, social and family history. Reviewed medications and allergies.     Objective:   Physical Exam BP 124/88 (BP Location: Right Arm, Patient Position: Sitting, Cuff Size: Large)   Pulse 83   Ht 5\' 6"  (1.676 m)   Wt 221 lb 8 oz (100.5 kg)   LMP 07/19/2023   Breastfeeding No   BMI 35.75 kg/m     Skin warm and dry.Lungs: clear to ausculation bilaterally. Cardiovascular: regular rate and rhythm.   Upstream - 10/19/23 1541       Pregnancy Intention Screening   Does the patient want to become pregnant in the next year? No    Does the patient's partner want to become pregnant in the next year? No    Would the patient like to discuss contraceptive options today? N/A      Contraception Wrap Up   Current Method Abstinence    End Method Abstinence    Contraception Counseling Provided No             Assessment:     1. Miscarriage Check QHCG,it was 3899 10/13/23 She does not want to get pregnant anytime soon Review handout on [employer], as she has  gotten pregnant taking birthcontrol,(pills,depo,nexplanon and mirena) she is on 2 seizure meds and has migraines with aura - Beta hCG quant (ref lab)    No sex for now  Plan:     Follow up TBD

## 2023-10-20 LAB — BETA HCG QUANT (REF LAB): hCG Quant: 215 m[IU]/mL

## 2023-10-25 ENCOUNTER — Other Ambulatory Visit: Payer: Self-pay | Admitting: Adult Health

## 2023-10-25 DIAGNOSIS — O039 Complete or unspecified spontaneous abortion without complication: Secondary | ICD-10-CM

## 2023-10-27 ENCOUNTER — Other Ambulatory Visit: Payer: Self-pay | Admitting: Neurology

## 2024-02-20 HISTORY — PX: OTHER SURGICAL HISTORY: SHX169

## 2024-02-22 ENCOUNTER — Ambulatory Visit: Payer: Medicaid Other | Admitting: Neurology

## 2024-02-28 ENCOUNTER — Ambulatory Visit: Payer: Medicaid Other | Admitting: Neurology

## 2024-03-06 HISTORY — PX: OTHER SURGICAL HISTORY: SHX169

## 2024-05-24 ENCOUNTER — Encounter

## 2024-05-29 ENCOUNTER — Encounter: Payer: Self-pay | Admitting: *Deleted

## 2024-05-29 ENCOUNTER — Ambulatory Visit: Admitting: *Deleted

## 2024-05-29 VITALS — BP 135/89 | HR 93 | Ht 67.0 in | Wt 235.0 lb

## 2024-05-29 DIAGNOSIS — Z3201 Encounter for pregnancy test, result positive: Secondary | ICD-10-CM | POA: Diagnosis not present

## 2024-05-29 DIAGNOSIS — R8290 Unspecified abnormal findings in urine: Secondary | ICD-10-CM

## 2024-05-29 LAB — POCT URINALYSIS DIPSTICK OB
Glucose, UA: NEGATIVE
Ketones, UA: NEGATIVE
Nitrite, UA: NEGATIVE

## 2024-05-29 LAB — POCT URINE PREGNANCY: Preg Test, Ur: POSITIVE — AB

## 2024-05-29 MED ORDER — EPINEPHRINE 0.3 MG/0.3ML IJ SOAJ: 0.3000 mg | INTRAMUSCULAR | 2 refills | Status: AC | PRN

## 2024-05-29 MED ORDER — DOXYLAMINE-PYRIDOXINE 10-10 MG PO TBEC: DELAYED_RELEASE_TABLET | ORAL | 6 refills | Status: DC

## 2024-05-29 NOTE — Addendum Note (Signed)
 Addended by: KIZZIE SUZEN SAUNDERS on: 05/29/2024 04:53 PM   Modules accepted: Orders

## 2024-05-29 NOTE — Progress Notes (Addendum)
   NURSE VISIT- PREGNANCY CONFIRMATION   SUBJECTIVE:  Erin Krueger is a 28 y.o. [id number] female at [redacted]w[redacted]d by certain LMP of Patient's last menstrual period was 04/24/2024. Here for pregnancy confirmation.  Home pregnancy test: positive x 3  She reports nausea and vomiting and cramping.  She is taking prenatal vitamins.  Pt reports a strong odor with urine and cloudy urine X 2-3 days.   OBJECTIVE:  BP 135/89 (BP Location: Right Arm, Patient Position: Sitting, Cuff Size: Large)   Pulse 93   Ht 5' 7 (1.702 m)   Wt 235 lb (106.6 kg)   LMP 04/24/2024   BMI 36.81 kg/m   Appears well, in no apparent distress  Results for orders placed or performed in visit on 05/29/24 (from the past 24 hours)  POCT urine pregnancy   Collection Time: 05/29/24  4:03 PM  Result Value Ref Range   Preg Test, Ur Positive (A) Negative  POC Urinalysis Dipstick OB   Collection Time: 05/29/24  4:25 PM  Result Value Ref Range   Color, UA     Clarity, UA     Glucose, UA Negative Negative   Bilirubin, UA     Ketones, UA neg    Spec Grav, UA     Blood, UA trace    pH, UA     POC,PROTEIN,UA Trace Negative, Trace, Small (1+), Moderate (2+), Large (3+), 4+   Urobilinogen, UA     Nitrite, UA neg    Leukocytes, UA Large (3+) (A) Negative   Appearance     Odor      ASSESSMENT: Positive pregnancy test, [redacted]w[redacted]d by LMP    PLAN: Schedule for dating ultrasound in 3 weeks. Pt's urine was sent to lab for culture.  Prenatal vitamins: continue   Nausea medicines: requested-note routed to [facility] to send prescription. Pt also requests a refill on Epi pen. Pt was advised can continue Effexor. Needs to wean off of Trazodone . Advised to call the dr that prescribed med and let him know she is pregnant.    OB packet given: Yes  Erin Krueger  05/29/2024 4:48 PM

## 2024-06-02 ENCOUNTER — Encounter: Payer: Self-pay | Admitting: Women's Health

## 2024-06-02 ENCOUNTER — Other Ambulatory Visit: Payer: Self-pay | Admitting: Adult Health

## 2024-06-02 LAB — URINE CULTURE

## 2024-06-02 MED ORDER — NITROFURANTOIN MONOHYD MACRO 100 MG PO CAPS: 100.0000 mg | ORAL_CAPSULE | Freq: Two times a day (BID) | ORAL | 0 refills | Status: AC

## 2024-06-02 NOTE — Progress Notes (Unsigned)
 Rx macrobid

## 2024-06-05 ENCOUNTER — Ambulatory Visit: Payer: Self-pay | Admitting: Women's Health

## 2024-06-05 DIAGNOSIS — R8271 Bacteriuria: Secondary | ICD-10-CM | POA: Insufficient documentation

## 2024-06-05 DIAGNOSIS — O2341 Unspecified infection of urinary tract in pregnancy, first trimester: Secondary | ICD-10-CM | POA: Insufficient documentation

## 2024-06-10 ENCOUNTER — Other Ambulatory Visit: Payer: Self-pay

## 2024-06-10 ENCOUNTER — Emergency Department (HOSPITAL_COMMUNITY)
Admission: EM | Admit: 2024-06-10 | Discharge: 2024-06-11 | Disposition: A | Discharge status: Home / Self Care | Attending: Emergency Medicine | Admitting: Emergency Medicine

## 2024-06-10 ENCOUNTER — Encounter (HOSPITAL_COMMUNITY): Payer: Self-pay

## 2024-06-10 DIAGNOSIS — J45909 Unspecified asthma, uncomplicated: Secondary | ICD-10-CM | POA: Insufficient documentation

## 2024-06-10 DIAGNOSIS — Z7951 Long term (current) use of inhaled steroids: Secondary | ICD-10-CM | POA: Diagnosis not present

## 2024-06-10 DIAGNOSIS — O200 Threatened abortion: Secondary | ICD-10-CM | POA: Insufficient documentation

## 2024-06-10 DIAGNOSIS — O209 Hemorrhage in early pregnancy, unspecified: Secondary | ICD-10-CM | POA: Diagnosis present

## 2024-06-10 DIAGNOSIS — Z91040 Latex allergy status: Secondary | ICD-10-CM | POA: Diagnosis not present

## 2024-06-10 MED ORDER — OXYCODONE HCL 5 MG PO TABS
5.0000 mg | ORAL_TABLET | Freq: Once | ORAL | Status: AC
  Administered 2024-06-10: 5 mg via ORAL
  Filled 2024-06-10: qty 1

## 2024-06-10 NOTE — ED Provider Notes (Signed)
 [place] EMERGENCY DEPARTMENT AT [facility] Provider Note   CSN: [id number] Arrival date & time: 06/10/24  2259     Patient presents with: Vaginal Bleeding   Erin Krueger is a 28 y.o. female.  {Add pertinent medical, surgical, social history, OB history to YEP:67052}  Vaginal Bleeding Associated symptoms: abdominal pain   Patient presents for vaginal bleeding.  Medical history includes PNES, preeclampsia, asthma, anxiety.  She is G9, P6.  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  Most recently, this occurred in October.  She is currently [redacted] weeks pregnant based on LMP.  She has not yet had a prenatal ultrasound of this pregnancy.  [facility]  For the past 2 days, she has had lower abdominal cramping and some mild vaginal bleeding.  When sitting or lying down, pain is 5/10 in severity.  When standing or walking, pain increases to 7/10.  It is slightly greater on the right side.  Earlier today, she passed a quarter sized blood clot.  She has not had to change out pads throughout the day.  She denies any lightheadedness or dizziness.     Prior to Admission medications   Medication Sig Start Date End Date Taking? Authorizing Provider  Acetaminophen  (TYLENOL  PO) Take by mouth.    [provider]  ALBUTEROL  SULFATE IN Inhale into the lungs.    [provider]  Doxylamine -Pyridoxine  (DICLEGIS ) 10-10 MG TBEC 2 tabs q hs, if sx persist add 1 tab q am on day 3, if sx persist add 1 tab q afternoon on day 4 05/29/24   Kizzie Suzen SAUNDERS, CNM  EPINEPHrine  0.3 mg/0.3 mL IJ SOAJ injection Inject 0.3 mg into the muscle as needed for anaphylaxis. 05/29/24   Kizzie Suzen SAUNDERS, CNM  IBUPROFEN  PO Take by mouth.    [provider]  lamoTRIgine  (LAMICTAL ) 100 MG tablet TAKE 1 TABLET BY MOUTH TWICE A DAY 10/28/23   Camara, Pastor, MD  levETIRAcetam  (KEPPRA ) 750 MG tablet TAKE 2 TABLETS (1,500 MG TOTAL) BY MOUTH 2 (TWO) TIMES DAILY. 10/28/23 05/29/24   Gregg Pastor, MD  Prenatal Vit-Fe Fumarate-FA (PRENATAL VITAMIN PO) Take 1 capsule by mouth daily.    [provider]  promethazine  (PHENERGAN ) 25 MG tablet Take 1 tablet (25 mg total) by mouth every 6 (six) hours as needed for nausea or vomiting. 10/13/23   Signa Delon LABOR, NP  traZODone  (DESYREL ) 100 MG tablet  05/13/24   [provider]  venlafaxine XR (EFFEXOR-XR) 75 MG 24 hr capsule Take 75 mg by mouth daily.    [provider]  metoCLOPramide  (REGLAN ) 10 MG tablet Take 1 tablet (10 mg total) by mouth every 6 (six) hours as needed for nausea. 07/19/19 11/28/19  Cresenzo-Dishmon, Cathlean, CNM    Allergies: Bee venom, Cashew nut oil, Ceftriaxone , Penicillins, Ativan  [lorazepam ], Prednisone , Aztreonam, Latex, and Other    Review of Systems  Gastrointestinal:  Positive for abdominal pain.  Genitourinary:  Positive for vaginal bleeding.  All other systems reviewed and are negative.   Updated Vital Signs LMP 04/24/2024   Physical Exam Vitals and nursing note reviewed.  Constitutional:      General: She is not in acute distress.    Appearance: Normal appearance. She is well-developed. She is not ill-appearing, toxic-appearing or diaphoretic.  HENT:     Head: Normocephalic and atraumatic.     Right Ear: External ear normal.     Left Ear: External ear normal.  Nose: Nose normal.     Mouth/Throat:     Mouth: Mucous membranes are moist.  Eyes:     Extraocular Movements: Extraocular movements intact.     Conjunctiva/sclera: Conjunctivae normal.  Cardiovascular:     Rate and Rhythm: Normal rate and regular rhythm.  Pulmonary:     Effort: Pulmonary effort is normal. No respiratory distress.  Abdominal:     General: There is no distension.     Palpations: Abdomen is soft.     Tenderness: There is no abdominal tenderness.  Musculoskeletal:        General: No swelling. Normal range of motion.     Cervical back: Normal range of motion and neck supple.   Skin:    General: Skin is warm and dry.     Coloration: Skin is not jaundiced or pale.  Neurological:     General: No focal deficit present.     Mental Status: She is alert and oriented to person, place, and time.  Psychiatric:        Mood and Affect: Mood normal.        Behavior: Behavior normal.     (all labs ordered are listed, but only abnormal results are displayed) Labs Reviewed - No data to display  EKG: None  Radiology: No results found.  {Document cardiac monitor, telemetry assessment procedure when appropriate:32947} Procedures   Medications Ordered in the ED - No data to display    {Click here for ABCD2, HEART and other calculators REFRESH Note before signing:1}                              Medical Decision Making Amount and/or Complexity of Data Reviewed Labs: ordered.   This patient presents to the ED for concern of ***, this involves an extensive number of treatment options, and is a complaint that carries with it a high risk of complications and morbidity.  The differential diagnosis includes ***   Co morbidities / Chronic conditions that complicate the patient evaluation  ***   Additional history obtained:  Additional history obtained from EMR External records from outside source obtained and reviewed including ***   Lab Tests:  I Ordered, and personally interpreted labs.  The pertinent results include:  ***   Imaging Studies ordered:  I ordered imaging studies including ***  I independently visualized and interpreted imaging which showed *** I agree with the radiologist interpretation   Cardiac Monitoring: / EKG:  The patient was maintained on a cardiac monitor.  I personally viewed and interpreted the cardiac monitored which showed an underlying rhythm of: ***   Problem List / ED Course / Critical interventions / Medication management  Patient presents for vaginal bleeding and for trimester pregnancy.  Onset of vaginal bleeding  was 2 days ago.  She describes the amount of bleeding as very slight.  She did have a recent miscarriage in October.  She is worried about the same today.  She did pass one quarter sized clot earlier this evening.  She has had ongoing lower abdominal cramping, slightly greater on the right.  On exam, she is overall well-appearing.  She does not any significant tenderness in her lower abdomen.  She endorses current 5/10 severity pain.  She did take Tylenol  at home.  She does request pain medicine.  Will give 1 dose of oxycodone .  Per chart review, blood type is a positive.  RhoGAM is not indicated.  Workup  was initiated.*** I ordered medication including ***   Reevaluation of the patient after these medicines showed that the patient *** I have reviewed the patients home medicines and have made adjustments as needed   Consultations Obtained:  I requested consultation with the ***,  and discussed lab and imaging findings as well as pertinent plan - they recommend: ***   Social Determinants of Health:  ***   Test / Admission - Considered:  ***   {Document critical care time when appropriate  Document review of labs and clinical decision tools ie CHADS2VASC2, etc  Document your independent review of radiology images and any outside records  Document your discussion with family members, caretakers and with consultants  Document social determinants of health affecting pt's care  Document your decision making why or why not admission, treatments were needed:32947:::1}   Final diagnoses:  None    ED Discharge Orders     None

## 2024-06-10 NOTE — ED Triage Notes (Signed)
 Patient from home for vaginal bleeding and cramping that started 2 days ago. Patient is currently [redacted] weeks pregnant; [id number]. History of miscarriages. Reports bleeding is trickling and more noticeable when using the bathroom; reports 1 small clot about 1 hour ago. Spoke to after hours nurse and was told to come for evaluation of possible miscarriage or ectopic. Upon arrival to ER, patient is alert and oriented, ambu

## 2024-06-11 ENCOUNTER — Emergency Department (HOSPITAL_COMMUNITY)

## 2024-06-11 LAB — CBC WITH DIFFERENTIAL/PLATELET
Abs Immature Granulocytes: 0.05 K/uL (ref 0.00–0.07)
Basophils Absolute: 0 K/uL (ref 0.0–0.1)
Basophils Relative: 0 %
Eosinophils Absolute: 0.3 K/uL (ref 0.0–0.5)
Eosinophils Relative: 3 %
HCT: 38 % (ref 36.0–46.0)
Hemoglobin: 12.2 g/dL (ref 12.0–15.0)
Immature Granulocytes: 1 %
Lymphocytes Relative: 32 %
Lymphs Abs: 3.3 K/uL (ref 0.7–4.0)
MCH: 29 pg (ref 26.0–34.0)
MCHC: 32.1 g/dL (ref 30.0–36.0)
MCV: 90.3 fL (ref 80.0–100.0)
Monocytes Absolute: 0.8 K/uL (ref 0.1–1.0)
Monocytes Relative: 8 %
Neutro Abs: 6 K/uL (ref 1.7–7.7)
Neutrophils Relative %: 56 %
Platelets: 273 K/uL (ref 150–400)
RBC: 4.21 MIL/uL (ref 3.87–5.11)
RDW: 13.2 % (ref 11.5–15.5)
WBC: 10.4 K/uL (ref 4.0–10.5)
nRBC: 0 % (ref 0.0–0.2)

## 2024-06-11 LAB — URINALYSIS, ROUTINE W REFLEX MICROSCOPIC
Bilirubin Urine: NEGATIVE
Glucose, UA: NEGATIVE mg/dL
Ketones, ur: NEGATIVE mg/dL
Nitrite: NEGATIVE
Protein, ur: 100 mg/dL — AB
RBC / HPF: >50 RBC/hpf (ref 0–5)
Specific Gravity, Urine: 1.023 (ref 1.005–1.030)
pH: 6 (ref 5.0–8.0)

## 2024-06-11 LAB — COMPREHENSIVE METABOLIC PANEL WITH GFR
ALT: 17 U/L (ref 0–44)
AST: 16 U/L (ref 15–41)
Albumin: 3.5 g/dL (ref 3.5–5.0)
Alkaline Phosphatase: 71 U/L (ref 38–126)
Anion gap: 10 (ref 5–15)
BUN: 9 mg/dL (ref 6–20)
CO2: 23 mmol/L (ref 22–32)
Calcium: 9.5 mg/dL (ref 8.9–10.3)
Chloride: 104 mmol/L (ref 98–111)
Creatinine, Ser: 0.58 mg/dL (ref 0.44–1.00)
GFR, Estimated: >60 mL/min (ref >60)
Glucose, Bld: 133 mg/dL — ABNORMAL HIGH (ref 70–99)
Potassium: 3.4 mmol/L — ABNORMAL LOW (ref 3.5–5.1)
Sodium: 137 mmol/L (ref 135–145)
Total Bilirubin: 0.4 mg/dL (ref 0.0–1.2)
Total Protein: 6.9 g/dL (ref 6.5–8.1)

## 2024-06-11 LAB — HCG, QUANTITATIVE, PREGNANCY: hCG, Beta Chain, Quant, S: 36257 m[IU]/mL — ABNORMAL HIGH (ref <5)

## 2024-06-11 MED ORDER — POTASSIUM CHLORIDE CRYS ER 20 MEQ PO TBCR
40.0000 meq | EXTENDED_RELEASE_TABLET | Freq: Once | ORAL | Status: AC
  Administered 2024-06-11: 40 meq via ORAL
  Filled 2024-06-11: qty 2

## 2024-06-11 MED ORDER — OXYCODONE HCL 5 MG PO TABS
5.0000 mg | ORAL_TABLET | Freq: Once | ORAL | Status: AC
  Administered 2024-06-11: 5 mg via ORAL
  Filled 2024-06-11: qty 1

## 2024-06-11 NOTE — Discharge Instructions (Addendum)
 Your ultrasound today showed a viable intrauterine pregnancy.  Take Tylenol  as needed for pain.  Follow-up with your OB/GYN doctor.  Return to the emergency department for any new or worsening symptoms of concern.

## 2024-06-14 ENCOUNTER — Telehealth: Payer: Self-pay

## 2024-06-14 NOTE — Telephone Encounter (Signed)
 Left message to call office to schedule new ob appointments. Viability was confirmed at [facility] on 7/19.

## 2024-06-16 ENCOUNTER — Other Ambulatory Visit: Payer: Self-pay | Admitting: Neurology

## 2024-06-17 ENCOUNTER — Other Ambulatory Visit: Payer: Self-pay

## 2024-06-17 ENCOUNTER — Emergency Department (HOSPITAL_COMMUNITY): Admission: EM | Admit: 2024-06-17 | Discharge: 2024-06-18 | Disposition: A | Discharge status: Home / Self Care

## 2024-06-17 ENCOUNTER — Encounter (HOSPITAL_COMMUNITY): Payer: Self-pay

## 2024-06-17 DIAGNOSIS — R109 Unspecified abdominal pain: Secondary | ICD-10-CM

## 2024-06-17 DIAGNOSIS — Z87442 Personal history of urinary calculi: Secondary | ICD-10-CM | POA: Insufficient documentation

## 2024-06-17 DIAGNOSIS — O219 Vomiting of pregnancy, unspecified: Secondary | ICD-10-CM | POA: Insufficient documentation

## 2024-06-17 DIAGNOSIS — O2390 Unspecified genitourinary tract infection in pregnancy, unspecified trimester: Secondary | ICD-10-CM | POA: Insufficient documentation

## 2024-06-17 DIAGNOSIS — Z91040 Latex allergy status: Secondary | ICD-10-CM | POA: Diagnosis not present

## 2024-06-17 DIAGNOSIS — Z3A01 Less than 8 weeks gestation of pregnancy: Secondary | ICD-10-CM | POA: Diagnosis not present

## 2024-06-17 DIAGNOSIS — O99891 Other specified diseases and conditions complicating pregnancy: Secondary | ICD-10-CM

## 2024-06-17 LAB — COMPREHENSIVE METABOLIC PANEL WITH GFR
ALT: 15 U/L (ref 0–44)
AST: 19 U/L (ref 15–41)
Albumin: 3.3 g/dL — ABNORMAL LOW (ref 3.5–5.0)
Alkaline Phosphatase: 57 U/L (ref 38–126)
Anion gap: 12 (ref 5–15)
BUN: 9 mg/dL (ref 6–20)
CO2: 21 mmol/L — ABNORMAL LOW (ref 22–32)
Calcium: 8.4 mg/dL — ABNORMAL LOW (ref 8.9–10.3)
Chloride: 105 mmol/L (ref 98–111)
Creatinine, Ser: 0.65 mg/dL (ref 0.44–1.00)
GFR, Estimated: >60 mL/min (ref >60)
Glucose, Bld: 97 mg/dL (ref 70–99)
Potassium: 3.1 mmol/L — ABNORMAL LOW (ref 3.5–5.1)
Sodium: 138 mmol/L (ref 135–145)
Total Bilirubin: 0.4 mg/dL (ref 0.0–1.2)
Total Protein: 6.4 g/dL — ABNORMAL LOW (ref 6.5–8.1)

## 2024-06-17 LAB — URINALYSIS, ROUTINE W REFLEX MICROSCOPIC
Bilirubin Urine: NEGATIVE
Glucose, UA: NEGATIVE mg/dL
Hgb urine dipstick: NEGATIVE
Ketones, ur: NEGATIVE mg/dL
Nitrite: NEGATIVE
Protein, ur: 30 mg/dL — AB
Specific Gravity, Urine: 1.028 (ref 1.005–1.030)
pH: 5 (ref 5.0–8.0)

## 2024-06-17 LAB — CBC WITH DIFFERENTIAL/PLATELET
Abs Immature Granulocytes: 0.02 K/uL (ref 0.00–0.07)
Basophils Absolute: 0 K/uL (ref 0.0–0.1)
Basophils Relative: 0 %
Eosinophils Absolute: 0.2 K/uL (ref 0.0–0.5)
Eosinophils Relative: 2 %
HCT: 34.5 % — ABNORMAL LOW (ref 36.0–46.0)
Hemoglobin: 11.4 g/dL — ABNORMAL LOW (ref 12.0–15.0)
Immature Granulocytes: 0 %
Lymphocytes Relative: 31 %
Lymphs Abs: 3 K/uL (ref 0.7–4.0)
MCH: 29.9 pg (ref 26.0–34.0)
MCHC: 33 g/dL (ref 30.0–36.0)
MCV: 90.6 fL (ref 80.0–100.0)
Monocytes Absolute: 0.6 K/uL (ref 0.1–1.0)
Monocytes Relative: 7 %
Neutro Abs: 5.8 K/uL (ref 1.7–7.7)
Neutrophils Relative %: 60 %
Platelets: 239 K/uL (ref 150–400)
RBC: 3.81 MIL/uL — ABNORMAL LOW (ref 3.87–5.11)
RDW: 13.3 % (ref 11.5–15.5)
WBC: 9.6 K/uL (ref 4.0–10.5)
nRBC: 0 % (ref 0.0–0.2)

## 2024-06-17 MED ORDER — SODIUM CHLORIDE 0.9 % IV BOLUS
1000.0000 mL | Freq: Once | INTRAVENOUS | Status: AC
  Administered 2024-06-17: 1000 mL via INTRAVENOUS

## 2024-06-17 MED ORDER — ONDANSETRON HCL 4 MG/2ML IJ SOLN
4.0000 mg | Freq: Once | INTRAMUSCULAR | Status: AC
  Administered 2024-06-17: 4 mg via INTRAVENOUS
  Filled 2024-06-17: qty 2

## 2024-06-17 MED ORDER — FENTANYL CITRATE (PF) 100 MCG/2ML IJ SOLN
12.5000 ug | Freq: Once | INTRAMUSCULAR | Status: AC
  Administered 2024-06-17: 12.5 ug via INTRAVENOUS
  Filled 2024-06-17: qty 2

## 2024-06-17 NOTE — ED Triage Notes (Signed)
 Pt stated that she started having right flank pain a few hours ago. Pt has hx of kidney stones and believes this is another one

## 2024-06-17 NOTE — ED Provider Notes (Signed)
 [place] EMERGENCY DEPARTMENT AT [facility] Provider Note   CSN: [id number] Arrival date & time: 06/17/24  2132     Patient presents with: Flank Pain   Erin Krueger is a 28 y.o. female.  {Add pertinent medical, surgical, social history, OB history to HPI:5245} 28 year old female here for the right flank pain.  States it started today.  States it feels like the last time she had a kidney stone.  She states she is currently [redacted] weeks pregnant.  States she does have some burning with urination as well.  Admits to some nausea and vomiting but states has been going on for the last few weeks.  Denies any other symptoms or concerns.   Flank Pain Pertinent negatives include no chest pain, no abdominal pain and no shortness of breath.       Prior to Admission medications   Medication Sig Start Date End Date Taking? Authorizing Provider  Acetaminophen  (TYLENOL  PO) Take by mouth.    [provider]  ALBUTEROL  SULFATE IN Inhale into the lungs.    [provider]  Doxylamine -Pyridoxine  (DICLEGIS ) 10-10 MG TBEC 2 tabs q hs, if sx persist add 1 tab q am on day 3, if sx persist add 1 tab q afternoon on day 4 05/29/24   Kizzie Suzen SAUNDERS, CNM  EPINEPHrine  0.3 mg/0.3 mL IJ SOAJ injection Inject 0.3 mg into the muscle as needed for anaphylaxis. 05/29/24   Kizzie Suzen SAUNDERS, CNM  IBUPROFEN  PO Take by mouth.    [provider]  lamoTRIgine  (LAMICTAL ) 100 MG tablet TAKE 1 TABLET BY MOUTH TWICE A DAY 10/28/23   Camara, Pastor, MD  levETIRAcetam  (KEPPRA ) 750 MG tablet TAKE 2 TABLETS (1,500 MG TOTAL) BY MOUTH 2 (TWO) TIMES DAILY. 10/28/23 05/29/24  Gregg Pastor, MD  Prenatal Vit-Fe Fumarate-FA (PRENATAL VITAMIN PO) Take 1 capsule by mouth daily.    [provider]  promethazine  (PHENERGAN ) 25 MG tablet Take 1 tablet (25 mg total) by mouth every 6 (six) hours as needed for nausea or vomiting. 10/13/23   Signa Delon LABOR, NP  traZODone  (DESYREL ) 100 MG tablet   05/13/24   [provider]  venlafaxine XR (EFFEXOR-XR) 75 MG 24 hr capsule Take 75 mg by mouth daily.    [provider]  metoCLOPramide  (REGLAN ) 10 MG tablet Take 1 tablet (10 mg total) by mouth every 6 (six) hours as needed for nausea. 07/19/19 11/28/19  Cresenzo-Dishmon, Cathlean, CNM    Allergies: Bee venom, Cashew nut oil, Ceftriaxone , Penicillins, Ativan  [lorazepam ], Prednisone , Aztreonam, Latex, and Other    Review of Systems  Constitutional:  Negative for chills and fever.  HENT:  Negative for ear pain and sore throat.   Eyes:  Negative for pain and visual disturbance.  Respiratory:  Negative for cough and shortness of breath.   Cardiovascular:  Negative for chest pain and palpitations.  Gastrointestinal:  Negative for abdominal pain and vomiting.  Genitourinary:  Positive for flank pain. Negative for dysuria and hematuria.  Musculoskeletal:  Negative for arthralgias and back pain.  Skin:  Negative for color change and rash.  Neurological:  Negative for seizures and syncope.  All other systems reviewed and are negative.   Updated Vital Signs BP 112/89   Pulse 87   Ht 5' 7 (1.702 m)   Wt 106.6 kg   LMP 04/24/2024   SpO2 100%   BMI 36.81 kg/m   Physical Exam Vitals and nursing note reviewed.  Constitutional:  General: She is not in acute distress.    Appearance: She is well-developed.  HENT:     Head: Normocephalic and atraumatic.  Eyes:     Conjunctiva/sclera: Conjunctivae normal.  Cardiovascular:     Rate and Rhythm: Normal rate and regular rhythm.     Heart sounds: No murmur heard. Pulmonary:     Effort: Pulmonary effort is normal. No respiratory distress.     Breath sounds: Normal breath sounds.  Abdominal:     Palpations: Abdomen is soft.     Tenderness: There is abdominal tenderness. There is right CVA tenderness.  Musculoskeletal:        General: No swelling.     Cervical back: Neck supple.  Skin:    General: Skin is warm and dry.      Capillary Refill: Capillary refill takes less than 2 seconds.  Neurological:     Mental Status: She is alert.  Psychiatric:        Mood and Affect: Mood normal.     (all labs ordered are listed, but only abnormal results are displayed) Labs Reviewed  COMPREHENSIVE METABOLIC PANEL WITH GFR  URINALYSIS, ROUTINE W REFLEX MICROSCOPIC  CBC WITH DIFFERENTIAL/PLATELET  POC URINE PREG, ED    EKG: None  Radiology: No results found.  {Document cardiac monitor, telemetry assessment procedure when appropriate:32947} Procedures   Medications Ordered in the ED  ondansetron  (ZOFRAN ) injection 4 mg (4 mg Intravenous Given 06/17/24 2245)  sodium chloride  0.9 % bolus 1,000 mL (1,000 mLs Intravenous New Bag/Given 06/17/24 2245)      {Click here for ABCD2, HEART and other calculators REFRESH Note before signing:1}                              Medical Decision Making Amount and/or Complexity of Data Reviewed Labs: ordered.  Risk Prescription drug management.   ***  {Document critical care time when appropriate  Document review of labs and clinical decision tools ie CHADS2VASC2, etc  Document your independent review of radiology images and any outside records  Document your discussion with family members, caretakers and with consultants  Document social determinants of health affecting pt's care  Document your decision making why or why not admission, treatments were needed:32947:::1}   Final diagnoses:  None    ED Discharge Orders     None

## 2024-06-18 LAB — POC URINE PREG, ED: Preg Test, Ur: POSITIVE — AB

## 2024-06-18 MED ORDER — CEPHALEXIN 500 MG PO CAPS: 500.0000 mg | ORAL_CAPSULE | Freq: Four times a day (QID) | ORAL | 0 refills | Status: DC | PRN

## 2024-06-18 NOTE — Discharge Instructions (Addendum)
 Take your antibiotics as prescribed if you get itching take Benadryl .  Follow-up with your primary care doctor and your OB/GYN.  Also follow-up with urologist.  Call urology Monday morning to make an appointment for next week.  Return to the ER for any new or worsening symptoms.

## 2024-06-20 ENCOUNTER — Other Ambulatory Visit

## 2024-06-27 ENCOUNTER — Other Ambulatory Visit: Payer: Self-pay | Admitting: Medical Genetics

## 2024-07-19 DIAGNOSIS — Z3480 Encounter for supervision of other normal pregnancy, unspecified trimester: Secondary | ICD-10-CM | POA: Insufficient documentation

## 2024-07-19 DIAGNOSIS — O0990 Supervision of high risk pregnancy, unspecified, unspecified trimester: Secondary | ICD-10-CM | POA: Insufficient documentation

## 2024-07-20 ENCOUNTER — Other Ambulatory Visit (HOSPITAL_COMMUNITY)
Admission: RE | Admit: 2024-07-20 | Discharge: 2024-07-20 | Disposition: A | Discharge status: Home / Self Care | Source: Ambulatory Visit | Attending: Advanced Practice Midwife | Admitting: Advanced Practice Midwife

## 2024-07-20 ENCOUNTER — Encounter: Admitting: *Deleted

## 2024-07-20 ENCOUNTER — Ambulatory Visit (INDEPENDENT_AMBULATORY_CARE_PROVIDER_SITE_OTHER): Admitting: Advanced Practice Midwife

## 2024-07-20 ENCOUNTER — Encounter: Payer: Self-pay | Admitting: Advanced Practice Midwife

## 2024-07-20 VITALS — BP 117/80 | HR 110 | Wt 234.5 lb

## 2024-07-20 DIAGNOSIS — Z131 Encounter for screening for diabetes mellitus: Secondary | ICD-10-CM

## 2024-07-20 DIAGNOSIS — Z8279 Family history of other congenital malformations, deformations and chromosomal abnormalities: Secondary | ICD-10-CM

## 2024-07-20 DIAGNOSIS — O99351 Diseases of the nervous system complicating pregnancy, first trimester: Secondary | ICD-10-CM

## 2024-07-20 DIAGNOSIS — Z3A12 12 weeks gestation of pregnancy: Secondary | ICD-10-CM | POA: Insufficient documentation

## 2024-07-20 DIAGNOSIS — Z8759 Personal history of other complications of pregnancy, childbirth and the puerperium: Secondary | ICD-10-CM

## 2024-07-20 DIAGNOSIS — O09899 Supervision of other high risk pregnancies, unspecified trimester: Secondary | ICD-10-CM

## 2024-07-20 DIAGNOSIS — Z363 Encounter for antenatal screening for malformations: Secondary | ICD-10-CM

## 2024-07-20 DIAGNOSIS — O99211 Obesity complicating pregnancy, first trimester: Secondary | ICD-10-CM

## 2024-07-20 DIAGNOSIS — O0990 Supervision of high risk pregnancy, unspecified, unspecified trimester: Secondary | ICD-10-CM

## 2024-07-20 DIAGNOSIS — F445 Conversion disorder with seizures or convulsions: Secondary | ICD-10-CM

## 2024-07-20 DIAGNOSIS — O34218 Maternal care for other type scar from previous cesarean delivery: Secondary | ICD-10-CM | POA: Diagnosis not present

## 2024-07-20 DIAGNOSIS — O0991 Supervision of high risk pregnancy, unspecified, first trimester: Secondary | ICD-10-CM | POA: Insufficient documentation

## 2024-07-20 DIAGNOSIS — J45909 Unspecified asthma, uncomplicated: Secondary | ICD-10-CM

## 2024-07-20 DIAGNOSIS — O09299 Supervision of pregnancy with other poor reproductive or obstetric history, unspecified trimester: Secondary | ICD-10-CM

## 2024-07-20 DIAGNOSIS — G40909 Epilepsy, unspecified, not intractable, without status epilepticus: Secondary | ICD-10-CM

## 2024-07-20 DIAGNOSIS — O99210 Obesity complicating pregnancy, unspecified trimester: Secondary | ICD-10-CM

## 2024-07-20 DIAGNOSIS — Z3480 Encounter for supervision of other normal pregnancy, unspecified trimester: Secondary | ICD-10-CM

## 2024-07-20 DIAGNOSIS — O10911 Unspecified pre-existing hypertension complicating pregnancy, first trimester: Secondary | ICD-10-CM

## 2024-07-20 DIAGNOSIS — O10919 Unspecified pre-existing hypertension complicating pregnancy, unspecified trimester: Secondary | ICD-10-CM

## 2024-07-20 DIAGNOSIS — N200 Calculus of kidney: Secondary | ICD-10-CM

## 2024-07-20 DIAGNOSIS — Z98891 History of uterine scar from previous surgery: Secondary | ICD-10-CM

## 2024-07-20 MED ORDER — BLOOD PRESSURE MONITOR MISC: 0 refills | Status: DC

## 2024-07-20 MED ORDER — ASPIRIN 81 MG PO TBEC: 81.0000 mg | DELAYED_RELEASE_TABLET | Freq: Every day | ORAL | 6 refills | Status: DC

## 2024-07-20 MED ORDER — ONDANSETRON 4 MG PO TBDP: 4.0000 mg | ORAL_TABLET | Freq: Four times a day (QID) | ORAL | 2 refills | Status: DC | PRN

## 2024-07-20 NOTE — Patient Instructions (Signed)
 Erin Krueger, I greatly value your feedback.  If you receive a survey following your visit with us  today, we appreciate you taking the time to fill it out.  Thanks, Sherrell Ely, DNP, CNM  [facility] HAS MOVED!!! It is now [facility] ([address] [place], [place] [address]) Entrance located off of [address] Free 24/7 valet parking   Nausea & Vomiting Have saltine crackers or pretzels by your bed and eat a few bites before you raise your head out of bed in the morning Eat small frequent meals throughout the day instead of large meals Drink plenty of fluids throughout the day to stay hydrated, just don't drink a lot of fluids with your meals.  This can make your stomach fill up faster making you feel sick Do not brush your teeth right after you eat Products with real ginger are good for nausea, like ginger ale and ginger hard candy Make sure it says made with real ginger! Sucking on sour candy like lemon heads is also good for nausea If your prenatal vitamins make you nauseated, take them at night so you will sleep through the nausea Sea Bands If you feel like you need medicine for the nausea & vomiting please let us  know If you are unable to keep any fluids or food down please let us  know   Constipation Drink plenty of fluid, preferably water , throughout the day Eat foods high in fiber such as fruits, vegetables, and grains Exercise, such as walking, is a good way to keep your bowels regular Drink warm fluids, especially warm prune juice, or decaf coffee Eat a 1/2 cup of real oatmeal (not instant), 1/2 cup applesauce, and 1/2-1 cup warm prune juice every day If needed, you may take Colace (docusate sodium ) stool softener once or twice a day to help keep the stool soft.  If you still are having problems with constipation, you may take Miralax once daily as needed to help keep your bowels regular.   Home Blood Pressure Monitoring for  Patients   Your provider has recommended that you check your blood pressure (BP) at least once a week at home. If you do not have a blood pressure cuff at home, one will be provided for you. Contact your provider if you have not received your monitor within 1 week.   Helpful Tips for Accurate Home Blood Pressure Checks  Don't smoke, exercise, or drink caffeine  30 minutes before checking your BP Use the restroom before checking your BP (a full bladder can raise your pressure) Relax in a comfortable upright chair Feet on the ground Left arm resting comfortably on a flat surface at the level of your heart Legs uncrossed Back supported Sit quietly and don't talk Place the cuff on your bare arm Adjust snuggly, so that only two fingertips can fit between your skin and the top of the cuff Check 2 readings separated by at least one minute Keep a log of your BP readings For a visual, please reference this diagram: http://ccnc.care/bpdiagram  Provider Name: [facility]     Phone: [phone]  Zone 1: ALL CLEAR  Continue to monitor your symptoms:  BP reading is less than 140 (top number) or less than 90 (bottom number)  No right upper stomach pain No headaches or seeing spots No feeling nauseated or throwing up No swelling in face and hands  Zone 2: CAUTION Call your doctor's office for any of the following:  BP reading is greater than 140 (top number) or greater than 90 (bottom number)  Stomach pain under your ribs in the middle or right side Headaches or seeing spots Feeling nauseated or throwing up Swelling in face and hands  Zone 3: EMERGENCY  Seek immediate medical care if you have any of the following:  BP reading is greater than160 (top number) or greater than 110 (bottom number) Severe headaches not improving with Tylenol  Serious difficulty catching your breath Any worsening symptoms from Zone 2    First Trimester of Pregnancy The first trimester of pregnancy is from  week 1 until the end of week 12 (months 1 through 3). A week after a sperm fertilizes an egg, the egg will implant on the wall of the uterus. This embryo will begin to develop into a baby. Genes from you and your partner are forming the baby. The female genes determine whether the baby is a boy or a girl. At 6-8 weeks, the eyes and face are formed, and the heartbeat can be seen on ultrasound. At the end of 12 weeks, all the baby's organs are formed.  Now that you are pregnant, you will want to do everything you can to have a healthy baby. Two of the most important things are to get good prenatal care and to follow your health care provider's instructions. Prenatal care is all the medical care you receive before the baby's birth. This care will help prevent, find, and treat any problems during the pregnancy and childbirth. BODY CHANGES Your body goes through many changes during pregnancy. The changes vary from woman to woman.  You may gain or lose a couple of pounds at first. You may feel sick to your stomach (nauseous) and throw up (vomit). If the vomiting is uncontrollable, call your health care provider. You may tire easily. You may develop headaches that can be relieved by medicines approved by your health care provider. You may urinate more often. Painful urination may mean you have a bladder infection. You may develop heartburn as a result of your pregnancy. You may develop constipation because certain hormones are causing the muscles that push waste through your intestines to slow down. You may develop hemorrhoids or swollen, bulging veins (varicose veins). Your breasts may begin to grow larger and become tender. Your nipples may stick out more, and the tissue that surrounds them (areola) may become darker. Your gums may bleed and may be sensitive to brushing and flossing. Dark spots or blotches (chloasma, mask of pregnancy) may develop on your face. This will likely fade after the baby is  born. Your menstrual periods will stop. You may have a loss of appetite. You may develop cravings for certain kinds of food. You may have changes in your emotions from day to day, such as being excited to be pregnant or being concerned that something may go wrong with the pregnancy and baby. You may have more vivid and strange dreams. You may have changes in your hair. These can include thickening of your hair, rapid growth, and changes in texture. Some women also have hair loss during or after pregnancy, or hair that feels dry or thin. Your hair will most likely return to normal after your baby is born. WHAT TO EXPECT AT YOUR PRENATAL VISITS During a routine prenatal visit: You will be weighed to make sure you and the baby are growing normally. Your blood pressure will be taken. Your abdomen will be measured to track your baby's growth. The fetal  heartbeat will be listened to starting around week 10 or 12 of your pregnancy. Test results from any previous visits will be discussed. Your health care provider may ask you: How you are feeling. If you are feeling the baby move. If you have had any abnormal symptoms, such as leaking fluid, bleeding, severe headaches, or abdominal cramping. If you have any questions. Other tests that may be performed during your first trimester include: Blood tests to find your blood type and to check for the presence of any previous infections. They will also be used to check for low iron  levels (anemia) and Rh antibodies. Later in the pregnancy, blood tests for diabetes will be done along with other tests if problems develop. Urine tests to check for infections, diabetes, or protein in the urine. An ultrasound to confirm the proper growth and development of the baby. An amniocentesis to check for possible genetic problems. Fetal screens for spina bifida and Down syndrome. You may need other tests to make sure you and the baby are doing well. HOME CARE  INSTRUCTIONS  Medicines Follow your health care provider's instructions regarding medicine use. Specific medicines may be either safe or unsafe to take during pregnancy. Take your prenatal vitamins as directed. If you develop constipation, try taking a stool softener if your health care provider approves. Diet Eat regular, well-balanced meals. Choose a variety of foods, such as meat or vegetable-based protein, fish, milk and low-fat dairy products, vegetables, fruits, and whole grain breads and cereals. Your health care provider will help you determine the amount of weight gain that is right for you. Avoid raw meat and uncooked cheese. These carry germs that can cause birth defects in the baby. Eating four or five small meals rather than three large meals a day may help relieve nausea and vomiting. If you start to feel nauseous, eating a few soda crackers can be helpful. Drinking liquids between meals instead of during meals also seems to help nausea and vomiting. If you develop constipation, eat more high-fiber foods, such as fresh vegetables or fruit and whole grains. Drink enough fluids to keep your urine clear or pale yellow. Activity and Exercise Exercise only as directed by your health care provider. Exercising will help you: Control your weight. Stay in shape. Be prepared for labor and delivery. Experiencing pain or cramping in the lower abdomen or low back is a good sign that you should stop exercising. Check with your health care provider before continuing normal exercises. Try to avoid standing for long periods of time. Move your legs often if you must stand in one place for a long time. Avoid heavy lifting. Wear low-heeled shoes, and practice good posture. You may continue to have sex unless your health care provider directs you otherwise. Relief of Pain or Discomfort Wear a good support bra for breast tenderness.   Take warm sitz baths to soothe any pain or discomfort caused by  hemorrhoids. Use hemorrhoid cream if your health care provider approves.   Rest with your legs elevated if you have leg cramps or low back pain. If you develop varicose veins in your legs, wear support hose. Elevate your feet for 15 minutes, 3-4 times a day. Limit salt in your diet. Prenatal Care Schedule your prenatal visits by the twelfth week of pregnancy. They are usually scheduled monthly at first, then more often in the last 2 months before delivery. Write down your questions. Take them to your prenatal visits. Keep all your prenatal visits as directed  by your health care provider. Safety Wear your seat belt at all times when driving. Make a list of emergency phone numbers, including numbers for family, friends, the hospital, and police and fire departments. General Tips Ask your health care provider for a referral to a local prenatal education class. Begin classes no later than at the beginning of month 6 of your pregnancy. Ask for help if you have counseling or nutritional needs during pregnancy. Your health care provider can offer advice or refer you to specialists for help with various needs. Do not use hot tubs, steam rooms, or saunas. Do not douche or use tampons or scented sanitary pads. Do not cross your legs for long periods of time. Avoid cat litter boxes and soil used by cats. These carry germs that can cause birth defects in the baby and possibly loss of the fetus by miscarriage or stillbirth. Avoid all smoking, herbs, alcohol, and medicines not prescribed by your health care provider. Chemicals in these affect the formation and growth of the baby. Schedule a dentist appointment. At home, brush your teeth with a soft toothbrush and be gentle when you floss. SEEK MEDICAL CARE IF:  You have dizziness. You have mild pelvic cramps, pelvic pressure, or nagging pain in the abdominal area. You have persistent nausea, vomiting, or diarrhea. You have a bad smelling vaginal  discharge. You have pain with urination. You notice increased swelling in your face, hands, legs, or ankles. SEEK IMMEDIATE MEDICAL CARE IF:  You have a fever. You are leaking fluid from your vagina. You have spotting or bleeding from your vagina. You have severe abdominal cramping or pain. You have rapid weight gain or loss. You vomit blood or material that looks like coffee grounds. You are exposed to [place] measles and have never had them. You are exposed to fifth disease or chickenpox. You develop a severe headache. You have shortness of breath. You have any kind of trauma, such as from a fall or a car accident. Document Released: 11/03/2001 Document Revised: 03/26/2014 Document Reviewed: 09/19/2013 [facility] Patient Information 2015 [place], [place]. This information is not intended to replace advice given to you by your health care provider. Make sure you discuss any questions you have with your health care provider.  ADDITIONAL HEALTHCARE OPTIONS FOR PATIENTS  [facility] / e-Visit: [email or web address]         [facility]: [phone]  [facility]: [phone]                   [facility]: [phone]     Safe Medications in Pregnancy   Acne: Benzoyl Peroxide Salicylic Acid  Backache/Headache: Tylenol : 2 regular strength every 4 hours OR              2 Extra strength every 6 hours  Colds/Coughs/Allergies: Benadryl  (alcohol free) 25 mg every 6 hours as needed Breath right strips Claritin  Cepacol throat lozenges Chloraseptic throat spray Cold-Eeze- up to three times per day Cough drops, alcohol free Flonase  (by prescription only) Guaifenesin  Mucinex  Robitussin DM (plain only, alcohol free) Saline nasal spray/drops Sudafed (pseudoephedrine ) & Actifed ** use only after [redacted] weeks gestation and if you do not have high blood pressure Tylenol  Vicks Vaporub Zinc   lozenges Zyrtec    Constipation: Colace Ducolax suppositories Fleet enema Glycerin  suppositories Metamucil Milk of magnesia Miralax Senokot Smooth move tea  Diarrhea: Kaopectate Imodium A-D  *NO pepto Bismol  Hemorrhoids: Anusol  Anusol  HC Preparation H Tucks  Indigestion: Tums Maalox  Mylanta Zantac   Pepcid   Insomnia: Benadryl  (alcohol free) 25mg  every 6 hours as needed Tylenol  PM Unisom , no Gelcaps  Leg Cramps: Tums MagGel  Nausea/Vomiting:  Bonine Dramamine Emetrol Ginger extract Sea bands Meclizine  Nausea medication to take during pregnancy:  Unisom  (doxylamine  succinate 25 mg tablets) Take one tablet daily at bedtime. If symptoms are not adequately controlled, the dose can be increased to a maximum recommended dose of two tablets daily (1/2 tablet in the morning, 1/2 tablet mid-afternoon and one at bedtime). Vitamin B6 100mg  tablets. Take one tablet twice a day (up to 200 mg per day).  Skin Rashes: Aveeno products Benadryl  cream or 25mg  every 6 hours as needed Calamine Lotion 1% cortisone cream  Yeast infection: Gyne-lotrimin 7 Monistat 7   **If taking multiple medications, please check labels to avoid duplicating the same active ingredients **take medication as directed on the label ** Do not exceed 4000 mg of tylenol  in 24 hours **Do not take medications that contain aspirin  or ibuprofen    [phone] is the phone number for Pregnancy Classes or hospital tours at [facility].   You will be referred to  [email or web address]   for more information on childbirth classes   At this site you may register for classes. You may sign up for a waiting list if classes are full. Please SIGN UP FOR THIS!.   When the waiting list becomes long, sometimes new classes can be added.  [facility] Call to Register: [phone] or  [phone]   or   Register Online: [email or web address] THESE CLASSES FILL UP VERY QUICKLY, SO SIGN UP AS SOON AS YOU CAN!!! Please visit [facility] pregnancy website at [email or web address]  Childbirth Classes  Option 1: Birth & Baby Series Series of 3 weekly classes, on the same day of the week (can choose Mon-Thurs) from 6-9pm Helps you and your support person prepare for childbirth Reviews newborn care, labor & birth, cesarean birth, pain management, and comfort techniques Cost: $60 per couple for insured or self-pay, $30 per couple for Medicaid  Option 2: Weekend Birth & Baby This class is a weekend version of our Birth & Baby series.  It is designed for parents who have a difficult time fitting several weeks of classes into their schedule.   Covers the care of your newborn and the basics of labor and childbirth Friday 6:30pm-8:30pm Saturday 9am-4pm, includes lunch for you and your partner  Cost: $75 per couple for insured or self-pay, $30 per couple for Medicaid  Option 3: Natural Childbirth This series of 5 weekly classes is for expectant parents who want to learn and practice natural methods of coping with the process of labor and childbirth.  Can choose Mon or Tues, 7-9pm.   Covers relaxation, breathing, massage, visualization, role of the partner, and helpful positioning Participants learn how to be confident in their body's ability to give birth. Class empowers and helps parents make informed decisions about care. Includes discussion that will help new parents transition into the immediate postpartum period.  Cost: $75 per couple for insured or self-pay, $30 per couple for Medicaid  Option 4: Online Birth & Baby This online class offers you the freedom to complete a Birth & Baby series in the comfort of your own home.  The flexibility of this option allows you to review sections at your own pace, at times convenient to you and your support people.  It includes additional  video information, animations, quizzes and extended activities. Get  organized with helpful eClass tools, checklists, and trackers.  Cost: $60 for 60 days of online access                                                                            Other Available Classes  Baby & Me Enjoy this time to discuss newborn & infant parenting topics and family adjustment issues with other new mothers in a relaxed environment. Each week brings a new speaker or baby-centered activity. We encourage mothers and their babies (birth to crawling) to join us . You are welcome to visit this group even if you haven't delivered yet! It's wonderful to make new friends early and watch other moms interact with their babies. No registration or fee.  Big Brother/Big Sister Let your children share in the joy of a new brother or sister in this special class designed just for them. Discussion includes how families care for babies: swaddling, holding, diapering, safety, as well as how they can be helpful in their new role. This class is designed for children ages 2 to 106, but any age is welcome. Please register each child individually. $5 Breastfeeding Support Group This group is a mother-to-mother support circle where moms have the opportunity to share their breastfeeding experiences. A Breastfeeding Support nurse is present for questions and concerns. An infant scale is available for weight checks. No fee or registration.  Breastfeeding Your Baby Breastfeeding is a special time for mother and child. This class will help you feel ready to begin this important relationship. Your partner is encouraged to attend with you. Learn what to expect and feel more confident in the first days of breastfeeding your newborn. This class also addresses the most common fears and challenges of breastfeeding during the first few weeks, months, and beyond. $30 per couple Caring for Baby This class is for expectant and adoptive parents who want to  learn and practice the most up-to-date newborn care for their babies. Focus is on birth through first six weeks of life. Topics include feeding, bathing, diapering, crying, umbilical cord care, circumcision care and safe sleep. Parents learn how to recognize symptoms of illness and when to call the pediatrician. Register only the mom-to-be and your partner can plan to come with you. (*Note: This class is included in the Birth & Baby series and the Weekend Birth & Baby classes.) $10 per couple Comfort Techniques & Tour This 2-hour interactive class is designed for those who either do not wish to take the Birth & Baby series or for those who prefer our online childbirth class, but don't want to miss the opportunity to learn and practice hands-on techniques. These skills can help relieve some of the discomfort of labor and encourage your baby to rotate toward the best position for birth. You and your partner will be able to try a variety of labor positions with birth balls and rebozos as well as practice breathing, relaxation, and visual techniques. $20 per couple [employer] [facility] This course offers Dads-to-be the tools and knowledge needed to feel confident on their journey to becoming new fathers. Experienced dads, who have been trained as coaches, teach dads-to-be how to hold, comfort, diapers, swaddle and play with their infant while being  able to support the new mom as well. $25 Grandparent Love Expecting a grandbaby? Learn about the latest infant care and safety recommendations and ways to support your own child as he or she transitions into the parenting role. $10 per person Infant and Child CPR Parents, grandparents, babysitters, and friends learn Cardio-Pulmonary Resuscitation skills for infants and children. You will also learn how to treat both conscious and unconscious choking infants and children. Register each participant individually. (Note: This Family & Friends program does not offer  certification.) $20 per person Marvelous Multiples Expecting twins, triplets, or more? This free 2-hour class covers the differences in labor, birth, parenting, and breastfeeding issues that face multiples' parents.  Maternity Care Center Virtual Tour  Online virtual tour of the new  [facility] Talk This free mom-led group offers support and connection to mothers as they journey through the adjustments and struggles of that sometimes overwhelming first year after the birth of a child. A member of our staff will be present to share resources and additional support if needed, as you care for yourself and baby. You are welcome to visit this group before you deliver! It's wonderful to meet new friends early and watch other moms interact with their babies.  Waterbirth Class Interested in a waterbirth? This free informational class will help you discover whether waterbirth is the right fit for you and is required if you are planning a waterbirth. Education about waterbirth itself, supplies you may need, and what you may need from your support team is included in this class. Partners are encouraged to come.

## 2024-07-21 ENCOUNTER — Encounter: Payer: Self-pay | Admitting: Advanced Practice Midwife

## 2024-07-21 DIAGNOSIS — N200 Calculus of kidney: Secondary | ICD-10-CM | POA: Insufficient documentation

## 2024-07-21 DIAGNOSIS — Z98891 History of uterine scar from previous surgery: Secondary | ICD-10-CM | POA: Insufficient documentation

## 2024-07-21 LAB — COMPREHENSIVE METABOLIC PANEL WITH GFR
ALT: 13 IU/L (ref 0–32)
AST: 13 IU/L (ref 0–40)
Albumin: 4.3 g/dL (ref 4.0–5.0)
Alkaline Phosphatase: 78 IU/L (ref 44–121)
BUN/Creatinine Ratio: 15 (ref 9–23)
BUN: 9 mg/dL (ref 6–20)
Bilirubin Total: <0.2 mg/dL (ref 0.0–1.2)
CO2: 17 mmol/L — ABNORMAL LOW (ref 20–29)
Calcium: 9.7 mg/dL (ref 8.7–10.2)
Chloride: 102 mmol/L (ref 96–106)
Creatinine, Ser: 0.6 mg/dL (ref 0.57–1.00)
Globulin, Total: 2.8 g/dL (ref 1.5–4.5)
Glucose: 90 mg/dL (ref 70–99)
Potassium: 4.1 mmol/L (ref 3.5–5.2)
Sodium: 138 mmol/L (ref 134–144)
Total Protein: 7.1 g/dL (ref 6.0–8.5)
eGFR: 126 mL/min/1.73 (ref >59)

## 2024-07-21 LAB — CBC/D/PLT+RPR+RH+ABO+RUBIGG...
Antibody Screen: NEGATIVE
Basophils Absolute: 0 x10E3/uL (ref 0.0–0.2)
Basos: 0 %
EOS (ABSOLUTE): 0.2 x10E3/uL (ref 0.0–0.4)
Eos: 1 %
HCV Ab: NONREACTIVE
HIV Screen 4th Generation wRfx: NONREACTIVE
Hematocrit: 37.8 % (ref 34.0–46.6)
Hemoglobin: 12.5 g/dL (ref 11.1–15.9)
Hepatitis B Surface Ag: NEGATIVE
Immature Grans (Abs): 0 x10E3/uL (ref 0.0–0.1)
Immature Granulocytes: 0 %
Lymphocytes Absolute: 2.8 x10E3/uL (ref 0.7–3.1)
Lymphs: 25 %
MCH: 29.6 pg (ref 26.6–33.0)
MCHC: 33.1 g/dL (ref 31.5–35.7)
MCV: 89 fL (ref 79–97)
Monocytes Absolute: 0.7 x10E3/uL (ref 0.1–0.9)
Monocytes: 6 %
Neutrophils Absolute: 7.6 x10E3/uL — ABNORMAL HIGH (ref 1.4–7.0)
Neutrophils: 68 %
Platelets: 297 x10E3/uL (ref 150–450)
RBC: 4.23 x10E6/uL (ref 3.77–5.28)
RDW: 13.9 % (ref 11.7–15.4)
RPR Ser Ql: NONREACTIVE
Rh Factor: POSITIVE
Rubella Antibodies, IGG: 7.45 {index} (ref >0.99)
WBC: 11.3 x10E3/uL — ABNORMAL HIGH (ref 3.4–10.8)

## 2024-07-21 LAB — PROTEIN / CREATININE RATIO, URINE
Creatinine, Urine: 199.3 mg/dL
Protein, Ur: 38.3 mg/dL
Protein/Creat Ratio: 192 mg/g{creat} (ref 0–200)

## 2024-07-21 LAB — HCV INTERPRETATION

## 2024-07-21 LAB — HEMOGLOBIN A1C
Est. average glucose Bld gHb Est-mCnc: 111 mg/dL
Hgb A1c MFr Bld: 5.5 % (ref 4.8–5.6)

## 2024-07-21 NOTE — Progress Notes (Signed)
 INITIAL OBSTETRICAL VISIT Patient name: Erin Krueger MRN [id number]  Date of birth: 12-31-1995 Chief Complaint:   Initial Prenatal Visit  History of Present Illness:   Tranesha D Auer is a 28 y.o. [id number]  female at [redacted]w[redacted]d by LMP c/w u/s at 6 weeks with an Estimated Date of Delivery: 01/29/25 being seen today for her initial obstetrical visit.   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   Today she reports nausea.     07/20/2024    3:43 PM 06/17/2022   10:05 AM 03/26/2022    2:13 PM 03/06/2022    9:48 AM 01/22/2022    4:50 PM  Depression screen PHQ 2/9  Decreased Interest 0 0   0  Down, Depressed, Hopeless 0 0   0  PHQ - 2 Score 0 0   0  Altered sleeping 0 3   0  Tired, decreased energy 0 3   0  Change in appetite 0 0   0  Feeling bad or failure about yourself  0 0   0  Trouble concentrating 0 3   0  Moving slowly or fidgety/restless 0 0   0  Suicidal thoughts 0 0   0  PHQ-9 Score 0 9   0  Difficult doing work/chores          Information is confidential and restricted. Go to Review Flowsheets to unlock data.    Patient's last menstrual period was 04/24/2024. Last pap  Diagnosis  Date Value Ref Range Status  08/12/2021   Final   - Negative for Intraepithelial Lesions or Malignancy (NILM)  08/12/2021 - Benign reactive/reparative changes  Final   Review of Systems:   Pertinent items are noted in HPI Denies cramping/contractions, leakage of fluid, vaginal bleeding, abnormal vaginal discharge w/ itching/odor/irritation, headaches, visual changes, shortness of breath, chest pain, abdominal pain, severe nausea/vomiting, or problems with  urination or bowel movements unless otherwise stated above.  Pertinent History Reviewed:  Reviewed past medical,surgical, social, obstetrical and family history.  Reviewed problem list, medications and allergies. OB History  Gravida Para Term Preterm AB Living  12 6 3 3 3 5   SAB IAB Ectopic Multiple Live Births  3   0 5    # Outcome Date GA Lbr Len/2nd Weight Sex Type Anes PTL Lv  12 Current           11 Preterm 01/09/23 [redacted]w[redacted]d  3.033 kg F CS-LTranv  N LIV     Complications: Umbilical cord prolapse  10 Preterm 01/24/22 [redacted]w[redacted]d 14:40 / 00:12 2.93 kg F Vag-Spont EPI  LIV  9 SAB 2022             Birth Comments: states didn't go to doctor, had positive home  pregnancy test , then bled and passed tissue- didn't go to appt because of history of 2 sab  8 Preterm 12/28/19 [redacted]w[redacted]d 08:08 / 00:05 2.79 kg M Vag-Spont EPI  LIV     Complications: Eclampsia  7 Term 11/08/18 [redacted]w[redacted]d 00:15 / 00:18 3.275 kg M Vag-Spont EPI  LIV     Birth Comments: WNL  6 Term 06/23/17 [redacted]w[redacted]d  2.948 kg  F Vag-Spont EPI N LIV     Birth Comments: baby had cardiac defect, otherwise wnl  5 SAB 07/2014 [redacted]w[redacted]d            Birth Comments: had D&C  4 Term 2014 [redacted]w[redacted]d       FD     Birth Comments: stillbirth  3 SAB 2014 [redacted]w[redacted]d         2 Gravida           1 Gravida            Physical Assessment:   Vitals:   07/20/24 1508  BP: 117/80  Pulse: (!) 110  Weight: 106.4 kg  Body mass index is 36.73 kg/m.       Physical Examination:  General appearance - well appearing, and in no distress  Mental status - alert, oriented to person, place, and time  Psych:  She has a normal mood and affect  Skin - warm and dry, normal color, no suspicious lesions noted  Chest - effort normal  Heart - normal rate and regular rhythm  Abdomen - soft, nontender  Extremities:  No swelling or varicosities noted    Results for orders placed or performed in visit on 07/20/24 (from the past 24 hours)  CBC/D/Plt+RPR+Rh+ABO+RubIgG...   Collection Time:  07/20/24  4:32 PM  Result Value Ref Range   Hepatitis B Surface Ag Negative Negative   HCV Ab Non Reactive Non Reactive   RPR Ser Ql Non Reactive Non Reactive   Rubella Antibodies, IGG 7.45 Immune >0.99 index   ABO Grouping O    Rh Factor Positive    Antibody Screen Negative Negative   HIV Screen 4th Generation wRfx Non Reactive Non Reactive   WBC 11.3 (H) 3.4 - 10.8 x10E3/uL   RBC 4.23 3.77 - 5.28 x10E6/uL   Hemoglobin 12.5 11.1 - 15.9 g/dL   Hematocrit 62.1 65.9 - 46.6 %   MCV 89 79 - 97 fL   MCH 29.6 26.6 - 33.0 pg   MCHC 33.1 31.5 - 35.7 g/dL   RDW 86.0 88.2 - 84.5 %   Platelets 297 150 - 450 x10E3/uL   Neutrophils 68 Not Estab. %   Lymphs 25 Not Estab. %   Monocytes 6 Not Estab. %   Eos 1 Not Estab. %   Basos 0 Not Estab. %   Neutrophils Absolute 7.6 (H) 1.4 - 7.0 x10E3/uL   Lymphocytes Absolute 2.8 0.7 - 3.1 x10E3/uL   Monocytes Absolute 0.7 0.1 - 0.9 x10E3/uL   EOS (ABSOLUTE) 0.2 0.0 - 0.4 x10E3/uL   Basophils Absolute 0.0 0.0 - 0.2 x10E3/uL   Immature Granulocytes 0 Not Estab. %   Immature Grans (Abs) 0.0 0.0 - 0.1 x10E3/uL  Comprehensive metabolic panel with GFR   Collection Time: 07/20/24  4:32 PM  Result Value Ref Range   Glucose 90 70 - 99 mg/dL   BUN 9 6 - 20 mg/dL   Creatinine, Ser 9.39 0.57 - 1.00 mg/dL   eGFR 873 >40 fO/fpw/8.26   BUN/Creatinine Ratio 15 9 - 23   Sodium 138 134 - 144 mmol/L   Potassium 4.1 3.5 - 5.2 mmol/L   Chloride 102 96 - 106 mmol/L   CO2 17 (L) 20 - 29 mmol/L   Calcium  9.7 8.7 - 10.2 mg/dL   Total Protein 7.1 6.0 - 8.5 g/dL   Albumin 4.3 4.0 - 5.0 g/dL   Globulin, Total 2.8 1.5 - 4.5 g/dL   Bilirubin Total <9.7 0.0 - 1.2  mg/dL   Alkaline Phosphatase 78 44 - 121 IU/L   AST 13 0 - 40 IU/L   ALT 13 0 - 32 IU/L  Hemoglobin A1c   Collection Time: 07/20/24  4:32 PM  Result Value Ref Range   Hgb A1c MFr Bld 5.5 4.8 - 5.6 %   Est. average glucose Bld gHb Est-mCnc 111 mg/dL  Protein / creatinine ratio, urine   Collection Time:  07/20/24  4:32 PM  Result Value Ref Range   Creatinine, Urine WILL FOLLOW    Protein, Ur WILL FOLLOW    Protein/Creat Ratio WILL FOLLOW   Interpretation:   Collection Time: 07/20/24  4:32 PM  Result Value Ref Range   HCV Interp 1: Comment      Indications for ASA therapy (per uptodate) One of the following: Previous pregnancy with preeclampsia, especially early onset and with an adverse outcome Yes     07/20/2024    3:43 PM 06/17/2022   10:05 AM 03/26/2022    2:13 PM 03/06/2022    9:48 AM 01/22/2022    4:50 PM  Depression screen PHQ 2/9  Decreased Interest 0 0   0  Down, Depressed, Hopeless 0 0   0  PHQ - 2 Score 0 0   0  Altered sleeping 0 3   0  Tired, decreased energy 0 3   0  Change in appetite 0 0   0  Feeling bad or failure about yourself  0 0   0  Trouble concentrating 0 3   0  Moving slowly or fidgety/restless 0 0   0  Suicidal thoughts 0 0   0  PHQ-9 Score 0 9   0  Difficult doing work/chores          Information is confidential and restricted. Go to Review Flowsheets to unlock data.        07/20/2024    3:43 PM 06/17/2022   10:07 AM 03/26/2022    2:15 PM 03/06/2022    9:24 AM  GAD 7 : Generalized Anxiety Score  Nervous, Anxious, on Edge 0 0    Control/stop worrying 0 0    Worry too much - different things 0 0    Trouble relaxing 0 1    Restless 0 3    Easily annoyed or irritable 0 0    Afraid - awful might happen 0 0    Total GAD 7 Score 0 4    Anxiety Difficulty         Information is confidential and restricted. Go to Review Flowsheets to unlock data.      Assessment & Plan:  1) High-Risk Pregnancy [id number] at [redacted]w[redacted]d with an Estimated Date of Delivery: 01/29/25   2) Initial OB visit    1. Screening for diabetes mellitus (Primary)  - Hemoglobin A1c  2. [redacted] weeks gestation of pregnancy   3. Supervision of high risk pregnancy, antepartum  - CH[facility] AMB BABYSCRIPTS SCHEDULE OPTIMIZATION - Blood Pressure Monitor MISC; For regular home bp monitoring  during pregnancy  Dispense: 1 each; Refill: 0 - CBC/D/Plt+RPR+Rh+ABO+RubIgG... - Comprehensive metabolic panel with GFR - Hemoglobin A1c - PANORAMA PRENATAL TEST - Protein / creatinine ratio, urine - Urine Culture - Cervicovaginal ancillary only  4. History of pre-eclampsia in prior pregnancy, currently pregnant  ASA 81 mg - Comprehensive metabolic panel with GFR - Protein / creatinine ratio, urine  5. Antenatal screening for malformation using ultrasonics  - US  OB Comp + 14 Wk; Future  6.  History of cesarean delivery Per op note Due to the elevated fetal position and emergent nature of the case, the incision was noted to actually be higher after delivery of the infant  TOLAC not recommended  7. Chronic hypertension affecting pregnancy Not on meds.  ASA 81mg  [x]   Baseline labs:       EFW q 4w @ 24w     no antenatal testing if bp remains <140/90 and no meds     Deliver @ 38-39.6wks:______   8. H/o child with congenital heart defect Plan fetal echo  9. History of eclampsia ASA  10. Obesity in pregnancy PG BMI 36  EFW 32 weeks  11. History of stillbirth At 40 weeks in 2014, no records available  12. Short interval between pregnancies affecting pregnancy, antepartum   13. 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 Uncomplicated asthma, unspecified asthma severity, unspecified whether persistent   15. Seizure disorder during pregnancy, antepartum (HCC) On keppra        Meds:  Meds ordered this encounter  Medications   Blood Pressure Monitor MISC    Sig: For regular home bp monitoring during pregnancy    Dispense:  1 each    Refill:  0    O09.91 Please mail to patient Large cuff   aspirin  EC 81 MG tablet    Sig: Take 1 tablet (81 mg total) by mouth daily.    Dispense:  30 tablet    Refill:  6    Supervising Provider:   JAYNE MINDER H [[id number]]   ondansetron  (ZOFRAN -ODT) 4 MG disintegrating tablet    Sig: Take 1 tablet (4 mg  total) by mouth every 6 (six) hours as needed for nausea.    Dispense:  30 tablet    Refill:  2    Supervising Provider:   JAYNE MINDER H [[id number]]    Initial labs obtained Continue prenatal vitamins Reviewed n/v relief measures and warning s/s to report Reviewed recommended weight gain based on pre-gravid BMI Encouraged well-balanced diet Genetic & carrier screening discussed: requests Panorama and AFP, declines NT/IT and Horizon  Ultrasound discussed; fetal survey: requested CCNC completed> form faxed if has or is planning to apply for medicaid The nature of [facility] [employer] with multiple MDs and other Advanced Practice Providers was explained to patient; also emphasized that fellows, residents, and students are part of our team. Doesn't have a home bp cuff. Rx faxed. Check bp weekly, let us  know if >140/90.        Cathlean Cresenzo-Dishmon 8:46 AM

## 2024-07-24 ENCOUNTER — Ambulatory Visit: Payer: Self-pay | Admitting: Advanced Practice Midwife

## 2024-07-24 LAB — URINE CULTURE

## 2024-07-24 MED ORDER — SULFAMETHOXAZOLE-TRIMETHOPRIM 800-160 MG PO TABS: 1.0000 | ORAL_TABLET | Freq: Two times a day (BID) | ORAL | 0 refills | Status: DC

## 2024-07-25 LAB — CERVICOVAGINAL ANCILLARY ONLY
Chlamydia: POSITIVE — AB
Comment: NEGATIVE
Comment: NORMAL
Neisseria Gonorrhea: NEGATIVE

## 2024-07-27 LAB — PANORAMA PRENATAL TEST FULL PANEL:PANORAMA TEST PLUS 5 ADDITIONAL MICRODELETIONS: FETAL FRACTION: 6.4

## 2024-07-27 MED ORDER — AZITHROMYCIN 500 MG PO TABS: 1000.0000 mg | ORAL_TABLET | Freq: Once | ORAL | 0 refills | Status: AC

## 2024-07-27 NOTE — Addendum Note (Signed)
 Addended by: NEWTON MERING on: 07/27/2024 10:34 AM   Modules accepted: Orders

## 2024-08-01 ENCOUNTER — Encounter (HOSPITAL_COMMUNITY): Payer: Self-pay

## 2024-08-01 ENCOUNTER — Emergency Department (HOSPITAL_COMMUNITY)
Admission: EM | Admit: 2024-08-01 | Discharge: 2024-08-01 | Discharge status: Against Medical Advice | Attending: Emergency Medicine | Admitting: Emergency Medicine

## 2024-08-01 ENCOUNTER — Other Ambulatory Visit: Payer: Self-pay

## 2024-08-01 DIAGNOSIS — Z5321 Procedure and treatment not carried out due to patient leaving prior to being seen by health care provider: Secondary | ICD-10-CM | POA: Insufficient documentation

## 2024-08-01 DIAGNOSIS — O219 Vomiting of pregnancy, unspecified: Secondary | ICD-10-CM | POA: Insufficient documentation

## 2024-08-01 DIAGNOSIS — R42 Dizziness and giddiness: Secondary | ICD-10-CM | POA: Insufficient documentation

## 2024-08-01 DIAGNOSIS — Z3A14 14 weeks gestation of pregnancy: Secondary | ICD-10-CM | POA: Insufficient documentation

## 2024-08-01 DIAGNOSIS — O99891 Other specified diseases and conditions complicating pregnancy: Secondary | ICD-10-CM | POA: Insufficient documentation

## 2024-08-01 DIAGNOSIS — R519 Headache, unspecified: Secondary | ICD-10-CM | POA: Insufficient documentation

## 2024-08-01 DIAGNOSIS — M7989 Other specified soft tissue disorders: Secondary | ICD-10-CM | POA: Diagnosis not present

## 2024-08-01 DIAGNOSIS — Z7982 Long term (current) use of aspirin: Secondary | ICD-10-CM | POA: Insufficient documentation

## 2024-08-01 DIAGNOSIS — R0602 Shortness of breath: Secondary | ICD-10-CM | POA: Insufficient documentation

## 2024-08-01 DIAGNOSIS — H538 Other visual disturbances: Secondary | ICD-10-CM | POA: Diagnosis not present

## 2024-08-01 DIAGNOSIS — Z5329 Procedure and treatment not carried out because of patient's decision for other reasons: Secondary | ICD-10-CM

## 2024-08-01 DIAGNOSIS — Z91040 Latex allergy status: Secondary | ICD-10-CM | POA: Insufficient documentation

## 2024-08-01 LAB — URINALYSIS, ROUTINE W REFLEX MICROSCOPIC
Bilirubin Urine: NEGATIVE
Glucose, UA: NEGATIVE mg/dL
Hgb urine dipstick: NEGATIVE
Ketones, ur: NEGATIVE mg/dL
Nitrite: NEGATIVE
Protein, ur: 30 mg/dL — AB
Specific Gravity, Urine: 1.03 (ref 1.005–1.030)
WBC, UA: >50 WBC/hpf (ref 0–5)
pH: 6 (ref 5.0–8.0)

## 2024-08-01 LAB — COMPREHENSIVE METABOLIC PANEL WITH GFR
ALT: 13 U/L (ref 0–44)
AST: 14 U/L — ABNORMAL LOW (ref 15–41)
Albumin: 3.1 g/dL — ABNORMAL LOW (ref 3.5–5.0)
Alkaline Phosphatase: 72 U/L (ref 38–126)
Anion gap: 14 (ref 5–15)
BUN: 11 mg/dL (ref 6–20)
CO2: 19 mmol/L — ABNORMAL LOW (ref 22–32)
Calcium: 9.5 mg/dL (ref 8.9–10.3)
Chloride: 102 mmol/L (ref 98–111)
Creatinine, Ser: 0.54 mg/dL (ref 0.44–1.00)
GFR, Estimated: >60 mL/min (ref >60)
Glucose, Bld: 94 mg/dL (ref 70–99)
Potassium: 3.6 mmol/L (ref 3.5–5.1)
Sodium: 135 mmol/L (ref 135–145)
Total Bilirubin: 0.3 mg/dL (ref 0.0–1.2)
Total Protein: 7.1 g/dL (ref 6.5–8.1)

## 2024-08-01 LAB — CBC WITH DIFFERENTIAL/PLATELET
Abs Immature Granulocytes: 0.04 K/uL (ref 0.00–0.07)
Basophils Absolute: 0 K/uL (ref 0.0–0.1)
Basophils Relative: 0 %
Eosinophils Absolute: 0.2 K/uL (ref 0.0–0.5)
Eosinophils Relative: 2 %
HCT: 37.1 % (ref 36.0–46.0)
Hemoglobin: 12.4 g/dL (ref 12.0–15.0)
Immature Granulocytes: 0 %
Lymphocytes Relative: 23 %
Lymphs Abs: 2.6 K/uL (ref 0.7–4.0)
MCH: 29.2 pg (ref 26.0–34.0)
MCHC: 33.4 g/dL (ref 30.0–36.0)
MCV: 87.3 fL (ref 80.0–100.0)
Monocytes Absolute: 0.8 K/uL (ref 0.1–1.0)
Monocytes Relative: 7 %
Neutro Abs: 7.5 K/uL (ref 1.7–7.7)
Neutrophils Relative %: 68 %
Platelets: 262 K/uL (ref 150–400)
RBC: 4.25 MIL/uL (ref 3.87–5.11)
RDW: 13.2 % (ref 11.5–15.5)
WBC: 11.2 K/uL — ABNORMAL HIGH (ref 4.0–10.5)
nRBC: 0 % (ref 0.0–0.2)

## 2024-08-01 LAB — MAGNESIUM: Magnesium: 1.8 mg/dL (ref 1.7–2.4)

## 2024-08-01 MED ORDER — METOCLOPRAMIDE HCL 5 MG/ML IJ SOLN
10.0000 mg | Freq: Once | INTRAMUSCULAR | Status: AC
  Administered 2024-08-01: 10 mg via INTRAVENOUS
  Filled 2024-08-01: qty 2

## 2024-08-01 MED ORDER — SODIUM CHLORIDE 0.9 % IV BOLUS
500.0000 mL | Freq: Once | INTRAVENOUS | Status: AC
  Administered 2024-08-01: 500 mL via INTRAVENOUS

## 2024-08-01 NOTE — ED Triage Notes (Addendum)
 Pt states that she is having migraines x2 days. Pt hx of same and has taken tylenol . Pt is 14W pregnant. Endorses nausea, vomiting, blurry vision and dizziness.   Hx of gestational hypertension - BP 131/108 in triage.

## 2024-08-01 NOTE — ED Notes (Signed)
 Pt in restroom obtaining urine sample at this time.

## 2024-08-01 NOTE — ED Provider Notes (Signed)
 [place] EMERGENCY DEPARTMENT AT [facility] Provider Note   CSN: [id number] Arrival date & time: 08/01/24  1858     Patient presents with: No chief complaint on file.   Erin Krueger is a 28 y.o. female. HPI Patient is a 28 year old female present to ED today with concerns for pulsatile headache noted over the forehead and temples, nausea, vomiting, shortness of breath, lower leg swelling, bilateral blurry vision, and vertigo when rising from seated position x 2 days.  Notably is approximately [redacted] weeks pregnant, with reporting this is her seventh pregnancy with 5 living children.  Notably leg swelling has improved today however headache has been accompanied with photophobia, phonophobia, noted to be pulsatile.  Similar to previous migraines but just more persistent.  History of migraines, seizures, preeclampsia, kidney stones, eclampsia, preeclampsia, PID, asthma.  Currently taking Lamictal , Keppra , Effexor.  Denies cough, congestion, hematemesis, abdominal pain, diarrhea, melena, hematochezia, dysuria, urinary urgency, vaginal bleeding, vaginal pain, vaginal discharge      Prior to Admission medications   Medication Sig Start Date End Date Taking? Authorizing Provider  Acetaminophen  (TYLENOL  PO) Take by mouth.    [provider]  ALBUTEROL  SULFATE IN Inhale into the lungs.    [provider]  aspirin  EC 81 MG tablet Take 1 tablet (81 mg total) by mouth daily. 07/20/24   Cresenzo-Dishmon, Cathlean, CNM  Blood Pressure Monitor MISC For regular home bp monitoring during pregnancy 07/20/24   Cresenzo-Dishmon, Cathlean, CNM  EPINEPHrine  0.3 mg/0.3 mL IJ SOAJ injection Inject 0.3 mg into the muscle as needed for anaphylaxis. 05/29/24   Kizzie Suzen SAUNDERS, CNM  lamoTRIgine  (LAMICTAL ) 100 MG tablet TAKE 1 TABLET BY MOUTH TWICE A DAY 10/28/23   Gregg Lek, MD  levETIRAcetam  (KEPPRA ) 750 MG tablet TAKE 2 TABLETS (1,500 MG TOTAL) BY MOUTH 2 (TWO) TIMES DAILY.  06/19/24 01/15/25  Gregg Lek, MD  ondansetron  (ZOFRAN -ODT) 4 MG disintegrating tablet Take 1 tablet (4 mg total) by mouth every 6 (six) hours as needed for nausea. 07/20/24   Cresenzo-Dishmon, Cathlean, CNM  Prenatal Vit-Fe Fumarate-FA (PRENATAL VITAMIN PO) Take 1 capsule by mouth daily.    [provider]  sulfamethoxazole -trimethoprim  (BACTRIM  DS) 800-160 MG tablet Take 1 tablet by mouth 2 (two) times daily. 07/24/24   Cresenzo-Dishmon, Cathlean, CNM  venlafaxine XR (EFFEXOR-XR) 75 MG 24 hr capsule Take 75 mg by mouth daily.    [provider]  metoCLOPramide  (REGLAN ) 10 MG tablet Take 1 tablet (10 mg total) by mouth every 6 (six) hours as needed for nausea. 07/19/19 11/28/19  Cresenzo-Dishmon, Cathlean, CNM    Allergies: Bee venom, Cashew nut oil, Ceftriaxone , Penicillins, Ativan  [lorazepam ], Prednisone , Aztreonam, Latex, and Other    Review of Systems  Eyes:  Positive for photophobia and visual disturbance.  Respiratory:  Positive for shortness of breath.   Neurological:  Positive for headaches.  All other systems reviewed and are negative.   Updated Vital Signs BP 104/79   Pulse 94   Temp 98.2 F (36.8 C)   Resp 17   Ht 5' 7 (1.702 m)   Wt 106.1 kg   LMP 04/24/2024   SpO2 96%   BMI 36.65 kg/m   Physical Exam Vitals and nursing note reviewed.  Constitutional:      General: She is not in acute distress.    Appearance: Normal appearance. She is not ill-appearing or diaphoretic.  HENT:     Head: Normocephalic and atraumatic.  Eyes:     General: No scleral  icterus.       Right eye: No discharge.        Left eye: No discharge.     Extraocular Movements: Extraocular movements intact.     Conjunctiva/sclera: Conjunctivae normal.     Pupils: Pupils are equal, round, and reactive to light.  Cardiovascular:     Rate and Rhythm: Normal rate and regular rhythm.     Pulses: Normal pulses.     Heart sounds: Normal heart sounds. No murmur heard.    No friction rub.  No gallop.  Pulmonary:     Effort: Pulmonary effort is normal. No respiratory distress.     Breath sounds: No stridor. No wheezing, rhonchi or rales.  Chest:     Chest wall: No tenderness.  Abdominal:     General: Abdomen is flat. There is no distension.     Palpations: Abdomen is soft.     Tenderness: There is no abdominal tenderness. There is no right CVA tenderness, left CVA tenderness, guarding or rebound.  Musculoskeletal:        General: No swelling, deformity or signs of injury.     Cervical back: Normal range of motion. No rigidity.     Right lower leg: No edema.     Left lower leg: No edema.  Skin:    General: Skin is warm and dry.     Findings: No bruising, erythema or lesion.  Neurological:     General: No focal deficit present.     Mental Status: She is alert and oriented to person, place, and time. Mental status is at baseline.     Sensory: No sensory deficit.     Motor: No weakness.  Psychiatric:        Mood and Affect: Mood normal.     (all labs ordered are listed, but only abnormal results are displayed) Labs Reviewed  CBC WITH DIFFERENTIAL/PLATELET - Abnormal; Notable for the following components:      Result Value   WBC 11.2 (*)    All other components within normal limits  COMPREHENSIVE METABOLIC PANEL WITH GFR  URINALYSIS, ROUTINE W REFLEX MICROSCOPIC  MAGNESIUM     EKG: None  Radiology: No results found.   Procedures   Medications Ordered in the ED  sodium chloride  0.9 % bolus 500 mL (0 mLs Intravenous Stopped 08/01/24 2000)  metoCLOPramide  (REGLAN ) injection 10 mg (10 mg Intravenous Given 08/01/24 1945)                               Medical Decision Making  This patient is a 28 year old female who presents to the ED for concern of multiple complaints.  Headache, vision changes, shortness of breath, x 2 days.  Seen for similar symptoms in 2023, with negative MRI and MRV.  On physical exam, patient is in no acute distress, afebrile, alert and  orient x 4, speaking in full sentences, nontachypneic, nontachycardic.  LCTAB, RRR no abdominal tenderness to palpation, no CVA tenderness.  No lower edema.  Initial labs ordered.  However patient left after initial labs due to family emergency, unable to evaluate or consult with patient before she left.  Left AMA without having a chance to further speak to the patient before her leaving.  Differential diagnoses prior to evaluation: The emergent differential diagnosis includes, but is not limited to, migraine, tension, cluster headache, venous thrombosis, CVA, preeclampsia. This is not an exhaustive differential.   Past Medical History / Co-morbidities /  Social History: Migraines, epilepsy, PID, seizures, asthma, eclampsia, preeclampsia, kidney stones  Additional history: Chart reviewed. Pertinent results include:   Last seen in the ED on 06/17/2024 for bacteriuria during pregnancy provided Keflex  that time.  History noted of large kidney stones.  Switch to TMP-SMX per OB/GYN.  Noted to have last seen OB/GYN, noted to be G12 [id number].  History of preeclampsia.  Noted to have a high risk pregnancy.  Noted to have a viable pregnancy, intrauterine on 06/11/2024.  Seen in January 2023 with similar symptoms seizure disorders, versus pseudoseizures had MRI and MRV at that time which were normal  Lab Tests/Imaging studies: I personally interpreted labs/imaging and the pertinent results include:   CBC notes a mildly elevated white count 11.2.. UA pending, CMP pending, magnesium  pending  Cardiac monitoring: EKG obtained and interpreted by myself and attending physician which shows: Sinus rhythm   Medications: I ordered medication including Reglan , NS.  I have reviewed the patients home medicines and have made adjustments as needed.  Critical Interventions: None  Social Determinants of Health: None  Disposition: Patient left AMA and eloped before I could further see her after initial HPI, unable  to evaluate the patient further.  Final diagnoses:  Left against medical advice    ED Discharge Orders     None          Beola Terrall GORMAN DEVONNA 08/01/24 2013    Cleotilde Rogue, MD 08/03/24 [id number]

## 2024-08-01 NOTE — ED Notes (Signed)
 Pt calls nurse to room and reports she has to leave due to a family emergency, PA made aware.

## 2024-08-01 NOTE — ED Provider Notes (Signed)
 This patient is a 28 year old female with a history of epileptic and nonepileptic seizures, history of anxiety and depression, she is on Keppra , Lamictal  and Effexor, she is currently pregnant with what she describes as her seventh pregnancy, she has had 5 children and recently miscarried last year.  She presents today with a complaint of a headache, the headache is bitemporal, throbbing, associated with sensitivity to light and nausea throughout the day today.  She does have a formal history of migraines, she had been admitted to the hospital 2 years ago when she was pregnant at [redacted] weeks along and had incapacitating headaches at that time, she had a full workup including an MRI and an MRV of the brain which showed no abnormalities including no venous sinus thrombosis.  She does report to me that she has a subtle blurry vision, she has no other focal neurologic symptoms and on my exam has a soft abdomen, clear heart sounds, clear lung sounds, normal speech coordination and HEENT exam.  She does not appear ill, she has no edema, we will proceed with fluids and Reglan , recheck the urine as she is currently taking Bactrim  for a UTI.   Cleotilde Rogue, MD 08/03/24 [phone]

## 2024-08-10 ENCOUNTER — Ambulatory Visit: Admitting: Advanced Practice Midwife

## 2024-08-10 VITALS — BP 124/86 | HR 100 | Wt 236.0 lb

## 2024-08-10 DIAGNOSIS — O0992 Supervision of high risk pregnancy, unspecified, second trimester: Secondary | ICD-10-CM | POA: Diagnosis not present

## 2024-08-10 DIAGNOSIS — O99212 Obesity complicating pregnancy, second trimester: Secondary | ICD-10-CM

## 2024-08-10 DIAGNOSIS — O99210 Obesity complicating pregnancy, unspecified trimester: Secondary | ICD-10-CM

## 2024-08-10 DIAGNOSIS — G40909 Epilepsy, unspecified, not intractable, without status epilepticus: Secondary | ICD-10-CM

## 2024-08-10 DIAGNOSIS — Z98891 History of uterine scar from previous surgery: Secondary | ICD-10-CM

## 2024-08-10 DIAGNOSIS — O10912 Unspecified pre-existing hypertension complicating pregnancy, second trimester: Secondary | ICD-10-CM | POA: Diagnosis not present

## 2024-08-10 DIAGNOSIS — O99352 Diseases of the nervous system complicating pregnancy, second trimester: Secondary | ICD-10-CM

## 2024-08-10 DIAGNOSIS — Z3A15 15 weeks gestation of pregnancy: Secondary | ICD-10-CM

## 2024-08-10 DIAGNOSIS — O0990 Supervision of high risk pregnancy, unspecified, unspecified trimester: Secondary | ICD-10-CM

## 2024-08-10 DIAGNOSIS — O10919 Unspecified pre-existing hypertension complicating pregnancy, unspecified trimester: Secondary | ICD-10-CM

## 2024-08-10 NOTE — Patient Instructions (Addendum)
 Erin Krueger, I greatly value your feedback.  If you receive a survey following your visit with us  today, we appreciate you taking the time to fill it out.  Thanks, Sherrell Ely, CNM     [facility] HAS MOVED!!! It is now [facility] at [facility] ([address] [place], [place] [address]) Entrance located off of [address] Free 24/7 valet parking   Go to [email or web address] to register for FREE online childbirth classes    Second Trimester of Pregnancy The second trimester is from week 14 through week 27 (months 4 through 6). The second trimester is often a time when you feel your best. Your body has adjusted to being pregnant, and you begin to feel better physically. Usually, morning sickness has lessened or quit completely, you may have more energy, and you may have an increase in appetite. The second trimester is also a time when the fetus is growing rapidly. At the end of the sixth month, the fetus is about 9 inches long and weighs about 1 pounds. You will likely begin to feel the baby move (quickening) between 16 and 20 weeks of pregnancy. Body changes during your second trimester Your body continues to go through many changes during your second trimester. The changes vary from woman to woman. Your weight will continue to increase. You will notice your lower abdomen bulging out. You may begin to get stretch marks on your hips, abdomen, and breasts. You may develop headaches that can be relieved by medicines. The medicines should be approved by your health care provider. You may urinate more often because the fetus is pressing on your bladder. You may develop or continue to have heartburn as a result of your pregnancy. You may develop constipation because certain hormones are causing the muscles that push waste through your intestines to slow down. You may develop hemorrhoids or swollen, bulging veins (varicose veins). You may have back pain. This is  caused by: Weight gain. Pregnancy hormones that are relaxing the joints in your pelvis. A shift in weight and the muscles that support your balance. Your breasts will continue to grow and they will continue to become tender. Your gums may bleed and may be sensitive to brushing and flossing. Dark spots or blotches (chloasma, mask of pregnancy) may develop on your face. This will likely fade after the baby is born. A dark line from your belly button to the pubic area (linea nigra) may appear. This will likely fade after the baby is born. You may have changes in your hair. These can include thickening of your hair, rapid growth, and changes in texture. Some women also have hair loss during or after pregnancy, or hair that feels dry or thin. Your hair will most likely return to normal after your baby is born.  What to expect at prenatal visits During a routine prenatal visit: You will be weighed to make sure you and the fetus are growing normally. Your blood pressure will be taken. Your abdomen will be measured to track your baby's growth. The fetal heartbeat will be listened to. Any test results from the previous visit will be discussed.  Your health care provider may ask you: How you are feeling. If you are feeling the baby move. If you have had any abnormal symptoms, such as leaking fluid, bleeding, severe headaches, or abdominal cramping. If you are using any tobacco products, including cigarettes, chewing tobacco, and electronic cigarettes. If you have any questions.  Other tests  that may be performed during your second trimester include: Blood tests that check for: Low iron  levels (anemia). High blood sugar that affects pregnant women (gestational diabetes) between 72 and 28 weeks. Rh antibodies. This is to check for a protein on red blood cells (Rh factor). Urine tests to check for infections, diabetes, or protein in the urine. An ultrasound to confirm the proper growth and  development of the baby. An amniocentesis to check for possible genetic problems. Fetal screens for spina bifida and Down syndrome. HIV (human immunodeficiency virus) testing. Routine prenatal testing includes screening for HIV, unless you choose not to have this test.  Follow these instructions at home: Medicines Follow your health care provider's instructions regarding medicine use. Specific medicines may be either safe or unsafe to take during pregnancy. Take a prenatal vitamin that contains at least 600 micrograms (mcg) of folic acid . If you develop constipation, try taking a stool softener if your health care provider approves. Eating and drinking Eat a balanced diet that includes fresh fruits and vegetables, whole grains, good sources of protein such as meat, eggs, or tofu, and low-fat dairy. Your health care provider will help you determine the amount of weight gain that is right for you. Avoid raw meat and uncooked cheese. These carry germs that can cause birth defects in the baby. If you have low calcium  intake from food, talk to your health care provider about whether you should take a daily calcium  supplement. Limit foods that are high in fat and processed sugars, such as fried and sweet foods. To prevent constipation: Drink enough fluid to keep your urine clear or pale yellow. Eat foods that are high in fiber, such as fresh fruits and vegetables, whole grains, and beans. Activity Exercise only as directed by your health care provider. Most women can continue their usual exercise routine during pregnancy. Try to exercise for 30 minutes at least 5 days a week. Stop exercising if you experience uterine contractions. Avoid heavy lifting, wear low heel shoes, and practice good posture. A sexual relationship may be continued unless your health care provider directs you otherwise. Relieving pain and discomfort Wear a good support bra to prevent discomfort from breast tenderness. Take  warm sitz baths to soothe any pain or discomfort caused by hemorrhoids. Use hemorrhoid cream if your health care provider approves. Rest with your legs elevated if you have leg cramps or low back pain. If you develop varicose veins, wear support hose. Elevate your feet for 15 minutes, 3-4 times a day. Limit salt in your diet. Prenatal Care Write down your questions. Take them to your prenatal visits. Keep all your prenatal visits as told by your health care provider. This is important. Safety Wear your seat belt at all times when driving. Make a list of emergency phone numbers, including numbers for family, friends, the hospital, and police and fire departments. General instructions Ask your health care provider for a referral to a local prenatal education class. Begin classes no later than the beginning of month 6 of your pregnancy. Ask for help if you have counseling or nutritional needs during pregnancy. Your health care provider can offer advice or refer you to specialists for help with various needs. Do not use hot tubs, steam rooms, or saunas. Do not douche or use tampons or scented sanitary pads. Do not cross your legs for long periods of time. Avoid cat litter boxes and soil used by cats. These carry germs that can cause birth defects in the baby  and possibly loss of the fetus by miscarriage or stillbirth. Avoid all smoking, herbs, alcohol, and unprescribed drugs. Chemicals in these products can affect the formation and growth of the baby. Do not use any products that contain nicotine  or tobacco, such as cigarettes and e-cigarettes. If you need help quitting, ask your health care provider. Visit your dentist if you have not gone yet during your pregnancy. Use a soft toothbrush to brush your teeth and be gentle when you floss. Contact a health care provider if: You have dizziness. You have mild pelvic cramps, pelvic pressure, or nagging pain in the abdominal area. You have persistent  nausea, vomiting, or diarrhea. You have a bad smelling vaginal discharge. You have pain when you urinate. Get help right away if: You have a fever. You are leaking fluid from your vagina. You have spotting or bleeding from your vagina. You have severe abdominal cramping or pain. You have rapid weight gain or weight loss. You have shortness of breath with chest pain. You notice sudden or extreme swelling of your face, hands, ankles, feet, or legs. You have not felt your baby move in over an hour. You have severe headaches that do not go away when you take medicine. You have vision changes. Summary The second trimester is from week 14 through week 27 (months 4 through 6). It is also a time when the fetus is growing rapidly. Your body goes through many changes during pregnancy. The changes vary from woman to woman. Avoid all smoking, herbs, alcohol, and unprescribed drugs. These chemicals affect the formation and growth your baby. Do not use any tobacco products, such as cigarettes, chewing tobacco, and e-cigarettes. If you need help quitting, ask your health care provider. Contact your health care provider if you have any questions. Keep all prenatal visits as told by your health care provider. This is important. This information is not intended to replace advice given to you by your health care provider. Make sure you discuss any questions you have with your health care provider.       Safe Medications in Pregnancy   Acne: Benzoyl Peroxide Salicylic Acid  Backache/Headache: Tylenol : 2 regular strength every 4 hours OR              2 Extra strength every 6 hours  Colds/Coughs/Allergies: Benadryl  (alcohol free) 25 mg every 6 hours as needed Breath right strips Claritin  Cepacol throat lozenges Chloraseptic throat spray Cold-Eeze- up to three times per day Cough drops, alcohol free Flonase  (by prescription only) Guaifenesin  Mucinex  Robitussin DM (plain only, alcohol  free) Saline nasal spray/drops Sudafed (pseudoephedrine ) & Actifed ** use only after [redacted] weeks gestation and if you do not have high blood pressure Tylenol  Vicks Vaporub Zinc  lozenges Zyrtec    Constipation: Colace Ducolax suppositories Fleet enema Glycerin  suppositories Metamucil Milk of magnesia Miralax Senokot Smooth move tea  Diarrhea: Kaopectate Imodium A-D  *NO pepto Bismol  Hemorrhoids: Anusol  Anusol  HC Preparation H Tucks  Indigestion: Tums Maalox Mylanta Zantac   Pepcid   Insomnia: Benadryl  (alcohol free) 25mg  every 6 hours as needed Tylenol  PM Unisom , no Gelcaps  Leg Cramps: Tums MagGel  Nausea/Vomiting:  Bonine Dramamine Emetrol Ginger extract Sea bands Meclizine  Nausea medication to take during pregnancy:  Unisom  (doxylamine  succinate 25 mg tablets) Take one tablet daily at bedtime. If symptoms are not adequately controlled, the dose can be increased to a maximum recommended dose of two tablets daily (1/2 tablet in the morning, 1/2 tablet mid-afternoon and one at bedtime). Vitamin B6  100mg  tablets. Take one tablet twice a day (up to 200 mg per day).  Skin Rashes: Aveeno products Benadryl  cream or 25mg  every 6 hours as needed Calamine Lotion 1% cortisone cream  Yeast infection: Gyne-lotrimin 7 Monistat 7   **If taking multiple medications, please check labels to avoid duplicating the same active ingredients **take medication as directed on the label ** Do not exceed 4000 mg of tylenol  in 24 hours **Do not take medications that contain aspirin  or ibuprofen   Erin Krueger, I greatly value your feedback.  If you receive a survey following your visit with us  today, we appreciate you taking the time to fill it out.  Thanks, Sherrell Ely, DNP, CNM  [facility] HAS MOVED!!! It is now [facility] at [facility] ([address] [place], [place] [address]) Entrance located off of [address] Free 24/7 valet  parking   Go to [email or web address] to register for FREE online childbirth classes    Call the office [phone]) or go to [facility] if: You begin to have strong, frequent contractions Your water  breaks.  Sometimes it is a big gush of fluid, sometimes it is just a trickle that keeps getting your panties wet or running down your legs You have vaginal bleeding.  It is normal to have a small amount of spotting if your cervix was checked.  You don't feel your baby moving like normal.  If you don't, get you something to eat and drink and lay down and focus on feeling your baby move.  You should feel at least 10 movements in 2 hours.  If you don't, you should call the office or go to [facility].   Home Blood Pressure Monitoring for Patients   Your provider has recommended that you check your blood pressure (BP) at least once a week at home. If you do not have a blood pressure cuff at home, one will be provided for you. Contact your provider if you have not received your monitor within 1 week.   Helpful Tips for Accurate Home Blood Pressure Checks  Don't smoke, exercise, or drink caffeine  30 minutes before checking your BP Use the restroom before checking your BP (a full bladder can raise your pressure) Relax in a comfortable upright chair Feet on the ground Left arm resting comfortably on a flat surface at the level of your heart Legs uncrossed Back supported Sit quietly and don't talk Place the cuff on your bare arm Adjust snuggly, so that only two fingertips can fit between your skin and the top of the cuff Check 2 readings separated by at least one minute Keep a log of your BP readings For a visual, please reference this diagram: http://ccnc.care/bpdiagram  Provider Name: [facility]     Phone: [phone]  Zone 1: ALL CLEAR  Continue to monitor your symptoms:  BP reading is less than 140 (top number) or less than 90 (bottom number)  No right upper stomach  pain No headaches or seeing spots No feeling nauseated or throwing up No swelling in face and hands  Zone 2: CAUTION Call your doctor's office for any of the following:  BP reading is greater than 140 (top number) or greater than 90 (bottom number)  Stomach pain under your ribs in the middle or right side Headaches or seeing spots Feeling nauseated or throwing up Swelling in face and hands  Zone 3: EMERGENCY  Seek immediate medical care if you have any of the  following:  BP reading is greater than160 (top number) or greater than 110 (bottom number) Severe headaches not improving with Tylenol  Serious difficulty catching your breath Any worsening symptoms from Zone 2

## 2024-08-10 NOTE — Progress Notes (Signed)
 HIGH-RISK PREGNANCY VISIT Patient name: Erin Krueger MRN [id number]  Date of birth: Jul 17, 1996 Chief Complaint:   Routine Prenatal Visit  History of Present Illness:   Erin Krueger is a 28 y.o. [id number] female at [redacted]w[redacted]d with an Estimated Date of Delivery: 01/29/25 being seen today for ongoing management of a high-risk pregnancy complicated by chronic hypertension currently on no meds, obesity (BMI >=35 and <40), and seizure disorder.    Today she reports both she and her partner took their azithromycin .  Hasn't gotten BP cuff in the mail, but pharmacy said they sent it out weeks ago. . Contractions: Not present. Vag. Bleeding: None.  Movement: Present. denies leaking of fluid.      07/20/2024    3:43 PM 06/17/2022   10:05 AM 03/26/2022    2:13 PM 03/06/2022    9:48 AM 01/22/2022    4:50 PM  Depression screen PHQ 2/9  Decreased Interest 0 0   0  Down, Depressed, Hopeless 0 0   0  PHQ - 2 Score 0 0   0  Altered sleeping 0 3   0  Tired, decreased energy 0 3   0  Change in appetite 0 0   0  Feeling bad or failure about yourself  0 0   0  Trouble concentrating 0 3   0  Moving slowly or fidgety/restless 0 0   0  Suicidal thoughts 0 0   0  PHQ-9 Score 0 9   0  Difficult doing work/chores          Information is confidential and restricted. Go to Review Flowsheets to unlock data.        07/20/2024    3:43 PM 06/17/2022   10:07 AM 03/26/2022    2:15 PM 03/06/2022    9:24 AM  GAD 7 : Generalized Anxiety Score  Nervous, Anxious, on Edge 0 0    Control/stop worrying 0 0    Worry too much - different things 0 0    Trouble relaxing 0 1    Restless 0 3    Easily annoyed or irritable 0 0    Afraid - awful might happen 0 0    Total GAD 7 Score 0 4    Anxiety Difficulty         Information is confidential and restricted. Go to Review Flowsheets to unlock data.     Review of Systems:   Pertinent items are noted in HPI Denies abnormal vaginal discharge w/ itching/odor/irritation,  headaches, visual changes, shortness of breath, chest pain, abdominal pain, severe nausea/vomiting, or problems with urination or bowel movements unless otherwise stated above. Pertinent History Reviewed:  Reviewed past medical,surgical, social, obstetrical and family history.  Reviewed problem list, medications and allergies. Physical Assessment:   Vitals:   08/10/24 1608  BP: 124/86  Pulse: 100  Weight: 236 lb (107 kg)  Body mass index is 36.96 kg/m.           Physical Examination:   General appearance: alert, well appearing, and in no distress  Mental status: alert, oriented to person, place, and time  Skin: warm & dry   Extremities:      Cardiovascular: normal heart rate noted  Respiratory: normal respiratory effort, no distress  Abdomen: gravid, soft, non-tender  Pelvic: Cervical exam deferred         Chaperone: N/A    Fetal Status: Fetal Heart Rate (bpm): 150   Movement: Present    Fetal  Surveillance Testing today: doppler     No results found for this or any previous visit (from the past 24 hours).  Assessment & Plan:  High-risk pregnancy: [id number] at [redacted]w[redacted]d with an Estimated Date of Delivery: 01/29/25   1. [redacted] weeks gestation of pregnancy (Primary)  - AFP, Serum, Open Spina Bifida  2. Supervision of high risk pregnancy, antepartum   4. Chronic hypertension affecting pregnancy BP cuff given to pt  5. History of cesarean delivery High Transverse on uterine incision, plans Repeat CS  6. Obesity in pregnancy EFW 32 weeks  7. Seizure disorder during pregnancy, antepartum (HCC) Continue keppra  and lamictal     Meds: No orders of the defined types were placed in this encounter.   Orders:  Orders Placed This Encounter  Procedures   AFP, Serum, Open Spina Bifida     Labs/procedures today: AFP   Reviewed:  general obstetric precautions including but not limited to vaginal bleeding, contractions, leaking of fluid and fetal movement were reviewed in  detail with the patient.  All questions were answered. Check bp weekly, let us  know if consistently >140 and/or >90.  Follow-up: Return for As scheduled.   Future Appointments  Date Time Provider Department Center  09/11/2024  2:45 PM Gregg Lek, MD [facility] None  09/12/2024  3:00 PM [facility]  [facility] None  09/12/2024  3:50 PM Kizzie Suzen SAUNDERS, CNM [facility]    Orders Placed This Encounter  Procedures   AFP, Serum, Open Spina Bifida   Cathlean Ely , DNP, CNM [facility] 08/10/2024 7:00 PM

## 2024-08-11 ENCOUNTER — Encounter: Payer: Self-pay | Admitting: Obstetrics & Gynecology

## 2024-08-17 ENCOUNTER — Ambulatory Visit

## 2024-08-17 VITALS — BP 104/74

## 2024-08-17 DIAGNOSIS — Z3A16 16 weeks gestation of pregnancy: Secondary | ICD-10-CM

## 2024-08-17 DIAGNOSIS — O10919 Unspecified pre-existing hypertension complicating pregnancy, unspecified trimester: Secondary | ICD-10-CM

## 2024-08-17 DIAGNOSIS — O10912 Unspecified pre-existing hypertension complicating pregnancy, second trimester: Secondary | ICD-10-CM

## 2024-08-17 NOTE — Progress Notes (Signed)
   NURSE VISIT- BLOOD PRESSURE CHECK  SUBJECTIVE:  Erin Krueger is a 28 y.o. [id number] female here for BP check. She is [redacted]w[redacted]d pregnant    HYPERTENSION ROS:  Pregnant:  Severe headaches that don't go away with tylenol /other medicines: Yes  Visual changes (seeing spots/double/blurred vision) No  Severe pain under right breast breast or in center of upper chest No  Severe nausea/vomiting No  Taking medicines as instructed not applicable   OBJECTIVE:  BP 104/74 (BP Location: Right Arm, Patient Position: Sitting, Cuff Size: Large) Comment: MANUAL  LMP 04/24/2024   Appearance alert, well appearing, and in no distress. RN unable to use automatic BP machines as they will not read her blood pressure. Manual cuff had to be used.  ASSESSMENT: Pregnancy [redacted]w[redacted]d  blood pressure check  PLAN: Discussed with Luke Fetters, CNM, [facility]   Recommendations: Dont use home BP cuff Follow-up: as scheduled   Erin Krueger  08/17/2024 4:35 PM

## 2024-09-08 ENCOUNTER — Other Ambulatory Visit: Payer: Self-pay | Admitting: Medical Genetics

## 2024-09-08 DIAGNOSIS — Z006 Encounter for examination for normal comparison and control in clinical research program: Secondary | ICD-10-CM

## 2024-09-10 ENCOUNTER — Encounter: Payer: Self-pay | Admitting: Obstetrics & Gynecology

## 2024-09-11 ENCOUNTER — Encounter: Payer: Self-pay | Admitting: Neurology

## 2024-09-11 ENCOUNTER — Ambulatory Visit: Admitting: Neurology

## 2024-09-12 ENCOUNTER — Other Ambulatory Visit (HOSPITAL_COMMUNITY)
Admission: RE | Admit: 2024-09-12 | Discharge: 2024-09-12 | Disposition: A | Discharge status: Home / Self Care | Source: Ambulatory Visit | Attending: Women's Health | Admitting: Women's Health

## 2024-09-12 ENCOUNTER — Ambulatory Visit (INDEPENDENT_AMBULATORY_CARE_PROVIDER_SITE_OTHER): Admitting: Women's Health

## 2024-09-12 ENCOUNTER — Ambulatory Visit

## 2024-09-12 ENCOUNTER — Encounter: Payer: Self-pay | Admitting: Women's Health

## 2024-09-12 VITALS — BP 124/89 | HR 65 | Wt 236.0 lb

## 2024-09-12 DIAGNOSIS — O10012 Pre-existing essential hypertension complicating pregnancy, second trimester: Secondary | ICD-10-CM

## 2024-09-12 DIAGNOSIS — Z363 Encounter for antenatal screening for malformations: Secondary | ICD-10-CM | POA: Diagnosis not present

## 2024-09-12 DIAGNOSIS — Z3A20 20 weeks gestation of pregnancy: Secondary | ICD-10-CM | POA: Diagnosis not present

## 2024-09-12 DIAGNOSIS — O99891 Other specified diseases and conditions complicating pregnancy: Secondary | ICD-10-CM

## 2024-09-12 DIAGNOSIS — A749 Chlamydial infection, unspecified: Secondary | ICD-10-CM | POA: Insufficient documentation

## 2024-09-12 DIAGNOSIS — Z124 Encounter for screening for malignant neoplasm of cervix: Secondary | ICD-10-CM | POA: Diagnosis present

## 2024-09-12 DIAGNOSIS — Z8759 Personal history of other complications of pregnancy, childbirth and the puerperium: Secondary | ICD-10-CM

## 2024-09-12 DIAGNOSIS — F418 Other specified anxiety disorders: Secondary | ICD-10-CM

## 2024-09-12 DIAGNOSIS — Z8279 Family history of other congenital malformations, deformations and chromosomal abnormalities: Secondary | ICD-10-CM

## 2024-09-12 DIAGNOSIS — O09292 Supervision of pregnancy with other poor reproductive or obstetric history, second trimester: Secondary | ICD-10-CM

## 2024-09-12 DIAGNOSIS — O0990 Supervision of high risk pregnancy, unspecified, unspecified trimester: Secondary | ICD-10-CM

## 2024-09-12 DIAGNOSIS — O10919 Unspecified pre-existing hypertension complicating pregnancy, unspecified trimester: Secondary | ICD-10-CM

## 2024-09-12 DIAGNOSIS — O10912 Unspecified pre-existing hypertension complicating pregnancy, second trimester: Secondary | ICD-10-CM

## 2024-09-12 DIAGNOSIS — O0992 Supervision of high risk pregnancy, unspecified, second trimester: Secondary | ICD-10-CM | POA: Diagnosis not present

## 2024-09-12 DIAGNOSIS — R8271 Bacteriuria: Secondary | ICD-10-CM

## 2024-09-12 DIAGNOSIS — Z3480 Encounter for supervision of other normal pregnancy, unspecified trimester: Secondary | ICD-10-CM

## 2024-09-12 NOTE — Patient Instructions (Signed)
 Erin Krueger, thank you for choosing our office today! We appreciate the opportunity to meet your healthcare needs. You may receive a short survey by mail, e-mail, or through [employer]. If you are happy with your care we would appreciate if you could take just a few minutes to complete the survey questions. We read all of your comments and take your feedback very seriously. Thank you again for choosing our office.  Center for [employer] Team at [facility] at [facility] ([address] [place], [place] [address]) Entrance C, located off of [address] Free 24/7 valet parking  Go to [email or web address] to register for FREE online childbirth classes  Call the office [phone]) or go to [name] if: You begin to severe cramping Your water  breaks.  Sometimes it is a big gush of fluid, sometimes it is just a trickle that keeps getting your panties wet or running down your legs You have vaginal bleeding.  It is normal to have a small amount of spotting if your cervix was checked.   [facility] Pediatricians/Family Doctors [facility]): [address] Luba BROCKS, [phone]           [facility]: [address], [phone]                [facility]): [address], [phone] (call to ask if accepting patients) [facility] Department: [address], [place], [phone]    [facility] Pediatricians/Family Doctors [facility]): [phone], [phone] [facility]: [address] [place], [phone] [facility]: [address], [phone]  [facility] Doctors  [facility]): [phone] [facility]: [address], [phone]   [facility] Doctors [facility]: [address], [phone]  [facility] Doctors  [facility]: [phone], [phone]  Home Blood Pressure Monitoring for Patients   Your provider has recommended that you check your blood pressure (BP) at least once a week at home. If you do not have a blood pressure cuff at home, one will be provided for you. Contact your provider if you have not received your monitor within 1 week.   Helpful Tips for Accurate Home Blood Pressure Checks  Don't smoke, exercise, or drink caffeine  30 minutes before checking your BP Use the restroom before checking your BP (a full bladder can raise your pressure) Relax in a comfortable upright chair Feet on the ground Left arm resting comfortably on a flat surface at the level of your heart Legs uncrossed Back supported Sit quietly and don't talk Place the cuff on your bare arm Adjust snuggly, so that only two fingertips can fit between your skin and the top of the cuff Check 2 readings separated by at least one minute Keep a log of your BP readings For a visual, please reference this diagram: http://ccnc.care/bpdiagram  Provider Name: [facility]     Phone: [phone]  Zone 1: ALL CLEAR  Continue to monitor your symptoms:  BP reading is less than 140 (top number) or less than 90 (bottom number)  No right upper stomach pain No headaches or seeing spots No feeling nauseated or throwing up No swelling in face and hands  Zone 2: CAUTION Call your doctor's office for any of the following:  BP reading is greater than 140 (top number) or greater than  90 (bottom number)  Stomach pain under your ribs in the middle or right side Headaches or seeing spots Feeling nauseated or throwing up Swelling in face and hands  Zone 3: EMERGENCY  Seek immediate medical care if you have any of the following:  BP reading is greater than160 (top number) or greater than 110 (bottom number) Severe headaches not improving with Tylenol  Serious difficulty catching your breath Any worsening symptoms from  Zone 2     Second Trimester of Pregnancy The second trimester is from week 14 through week 27 (months 4 through 6). The second trimester is often a time when you feel your best. Your body has adjusted to being pregnant, and you begin to feel better physically. Usually, morning sickness has lessened or quit completely, you may have more energy, and you may have an increase in appetite. The second trimester is also a time when the fetus is growing rapidly. At the end of the sixth month, the fetus is about 9 inches long and weighs about 1 pounds. You will likely begin to feel the baby move (quickening) between 16 and 20 weeks of pregnancy. Body changes during your second trimester Your body continues to go through many changes during your second trimester. The changes vary from woman to woman. Your weight will continue to increase. You will notice your lower abdomen bulging out. You may begin to get stretch marks on your hips, abdomen, and breasts. You may develop headaches that can be relieved by medicines. The medicines should be approved by your health care provider. You may urinate more often because the fetus is pressing on your bladder. You may develop or continue to have heartburn as a result of your pregnancy. You may develop constipation because certain hormones are causing the muscles that push waste through your intestines to slow down. You may develop hemorrhoids or swollen, bulging veins (varicose veins). You may have back pain. This is caused by: Weight gain. Pregnancy hormones that are relaxing the joints in your pelvis. A shift in weight and the muscles that support your balance. Your breasts will continue to grow and they will continue to become tender. Your gums may bleed and may be sensitive to brushing and flossing. Dark spots or blotches (chloasma, mask of pregnancy) may develop on your face. This will likely fade after the baby is born. A dark line from your belly button to  the pubic area (linea nigra) may appear. This will likely fade after the baby is born. You may have changes in your hair. These can include thickening of your hair, rapid growth, and changes in texture. Some women also have hair loss during or after pregnancy, or hair that feels dry or thin. Your hair will most likely return to normal after your baby is born.  What to expect at prenatal visits During a routine prenatal visit: You will be weighed to make sure you and the fetus are growing normally. Your blood pressure will be taken. Your abdomen will be measured to track your baby's growth. The fetal heartbeat will be listened to. Any test results from the previous visit will be discussed.  Your health care provider may ask you: How you are feeling. If you are feeling the baby move. If you have had any abnormal symptoms, such as leaking fluid, bleeding, severe headaches, or abdominal cramping. If you are using any tobacco products, including cigarettes, chewing tobacco, and electronic cigarettes. If you have any questions.  Other tests that may be performed during  your second trimester include: Blood tests that check for: Low iron  levels (anemia). High blood sugar that affects pregnant women (gestational diabetes) between 80 and 28 weeks. Rh antibodies. This is to check for a protein on red blood cells (Rh factor). Urine tests to check for infections, diabetes, or protein in the urine. An ultrasound to confirm the proper growth and development of the baby. An amniocentesis to check for possible genetic problems. Fetal screens for spina bifida and Down syndrome. HIV (human immunodeficiency virus) testing. Routine prenatal testing includes screening for HIV, unless you choose not to have this test.  Follow these instructions at home: Medicines Follow your health care provider's instructions regarding medicine use. Specific medicines may be either safe or unsafe to take during  pregnancy. Take a prenatal vitamin that contains at least 600 micrograms (mcg) of folic acid . If you develop constipation, try taking a stool softener if your health care provider approves. Eating and drinking Eat a balanced diet that includes fresh fruits and vegetables, whole grains, good sources of protein such as meat, eggs, or tofu, and low-fat dairy. Your health care provider will help you determine the amount of weight gain that is right for you. Avoid raw meat and uncooked cheese. These carry germs that can cause birth defects in the baby. If you have low calcium  intake from food, talk to your health care provider about whether you should take a daily calcium  supplement. Limit foods that are high in fat and processed sugars, such as fried and sweet foods. To prevent constipation: Drink enough fluid to keep your urine clear or pale yellow. Eat foods that are high in fiber, such as fresh fruits and vegetables, whole grains, and beans. Activity Exercise only as directed by your health care provider. Most women can continue their usual exercise routine during pregnancy. Try to exercise for 30 minutes at least 5 days a week. Stop exercising if you experience uterine contractions. Avoid heavy lifting, wear low heel shoes, and practice good posture. A sexual relationship may be continued unless your health care provider directs you otherwise. Relieving pain and discomfort Wear a good support bra to prevent discomfort from breast tenderness. Take warm sitz baths to soothe any pain or discomfort caused by hemorrhoids. Use hemorrhoid cream if your health care provider approves. Rest with your legs elevated if you have leg cramps or low back pain. If you develop varicose veins, wear support hose. Elevate your feet for 15 minutes, 3-4 times a day. Limit salt in your diet. Prenatal Care Write down your questions. Take them to your prenatal visits. Keep all your prenatal visits as told by your health  care provider. This is important. Safety Wear your seat belt at all times when driving. Make a list of emergency phone numbers, including numbers for family, friends, the hospital, and police and fire departments. General instructions Ask your health care provider for a referral to a local prenatal education class. Begin classes no later than the beginning of month 6 of your pregnancy. Ask for help if you have counseling or nutritional needs during pregnancy. Your health care provider can offer advice or refer you to specialists for help with various needs. Do not use hot tubs, steam rooms, or saunas. Do not douche or use tampons or scented sanitary pads. Do not cross your legs for long periods of time. Avoid cat litter boxes and soil used by cats. These carry germs that can cause birth defects in the baby and possibly loss of the  fetus by miscarriage or stillbirth. Avoid all smoking, herbs, alcohol, and unprescribed drugs. Chemicals in these products can affect the formation and growth of the baby. Do not use any products that contain nicotine  or tobacco, such as cigarettes and e-cigarettes. If you need help quitting, ask your health care provider. Visit your dentist if you have not gone yet during your pregnancy. Use a soft toothbrush to brush your teeth and be gentle when you floss. Contact a health care provider if: You have dizziness. You have mild pelvic cramps, pelvic pressure, or nagging pain in the abdominal area. You have persistent nausea, vomiting, or diarrhea. You have a bad smelling vaginal discharge. You have pain when you urinate. Get help right away if: You have a fever. You are leaking fluid from your vagina. You have spotting or bleeding from your vagina. You have severe abdominal cramping or pain. You have rapid weight gain or weight loss. You have shortness of breath with chest pain. You notice sudden or extreme swelling of your face, hands, ankles, feet, or legs. You  have not felt your baby move in over an hour. You have severe headaches that do not go away when you take medicine. You have vision changes. Summary The second trimester is from week 14 through week 27 (months 4 through 6). It is also a time when the fetus is growing rapidly. Your body goes through many changes during pregnancy. The changes vary from woman to woman. Avoid all smoking, herbs, alcohol, and unprescribed drugs. These chemicals affect the formation and growth your baby. Do not use any tobacco products, such as cigarettes, chewing tobacco, and e-cigarettes. If you need help quitting, ask your health care provider. Contact your health care provider if you have any questions. Keep all prenatal visits as told by your health care provider. This is important. This information is not intended to replace advice given to you by your health care provider. Make sure you discuss any questions you have with your health care provider. Document Released: 11/03/2001 Document Revised: 04/16/2016 Document Reviewed: 01/10/2013 Elsevier Interactive Patient Education  2017 [employer].

## 2024-09-12 NOTE — Progress Notes (Addendum)
 US  20+1 wks,cephalic,anterior placenta gr 0,normal ovaries,FHR 153 bpm,CX 4.6 cm,SVP of fluid 3.5 cm,EFW 313 g 27%,anatomy complete,no obvious abnormalities

## 2024-09-12 NOTE — Progress Notes (Signed)
 HIGH-RISK PREGNANCY VISIT Patient name: Erin Krueger MRN [id number]  Date of birth: 09-Jan-1996 Chief Complaint:   Routine Prenatal Visit and Pregnancy Ultrasound  History of Present Illness:   RACQUELLE HYSER is a 28 y.o. [id number] female at [redacted]w[redacted]d with an Estimated Date of Delivery: 01/29/25 being seen today for ongoing management of a high-risk pregnancy complicated by chronic hypertension currently on no meds, PG BMI 36, and h/o 40wk IUFD, recent +CT, recent ASB.    Today she reports round ligament pain. Dark urine w/ odor, no dysuria. Contractions: Not present.  .  Movement: Present. denies leaking of fluid.      07/20/2024    3:43 PM 06/17/2022   10:05 AM 03/26/2022    2:13 PM 03/06/2022    9:48 AM 01/22/2022    4:50 PM  Depression screen PHQ 2/9  Decreased Interest 0 0   0  Down, Depressed, Hopeless 0 0   0  PHQ - 2 Score 0 0   0  Altered sleeping 0 3   0  Tired, decreased energy 0 3   0  Change in appetite 0 0   0  Feeling bad or failure about yourself  0 0   0  Trouble concentrating 0 3   0  Moving slowly or fidgety/restless 0 0   0  Suicidal thoughts 0 0   0  PHQ-9 Score 0 9   0  Difficult doing work/chores          Information is confidential and restricted. Go to Review Flowsheets to unlock data.        07/20/2024    3:43 PM 06/17/2022   10:07 AM 03/26/2022    2:15 PM 03/06/2022    9:24 AM  GAD 7 : Generalized Anxiety Score  Nervous, Anxious, on Edge 0 0    Control/stop worrying 0 0    Worry too much - different things 0 0    Trouble relaxing 0 1    Restless 0 3    Easily annoyed or irritable 0 0    Afraid - awful might happen 0 0    Total GAD 7 Score 0 4    Anxiety Difficulty         Information is confidential and restricted. Go to Review Flowsheets to unlock data.     Review of Systems:   Pertinent items are noted in HPI Denies abnormal vaginal discharge w/ itching/odor/irritation, headaches, visual changes, shortness of breath, chest pain, abdominal pain,  severe nausea/vomiting, or problems with urination or bowel movements unless otherwise stated above. Pertinent History Reviewed:  Reviewed past medical,surgical, social, obstetrical and family history.  Reviewed problem list, medications and allergies. Physical Assessment:   Vitals:   09/12/24 1544  BP: 124/89  Pulse: 65  Weight: 236 lb (107 kg)  Body mass index is 36.96 kg/m.           Physical Examination:   General appearance: alert, well appearing, and in no distress  Mental status: alert, oriented to person, place, and time  Skin: warm & dry   Extremities: Edema: Trace    Cardiovascular: normal heart rate noted  Respiratory: normal respiratory effort, no distress  Abdomen: gravid, soft, non-tender  Pelvic: thin prep pap obtained        Fetal Status:     Movement: Present    Fetal Surveillance Testing today: US  20+1 wks,cephalic,anterior placenta gr 0,normal ovaries,FHR 153 bpm,CX 4.6 cm,SVP of fluid 3.5 cm,EFW 313 g 27%,anatomy  complete,no obvious abnormalities   Chaperone: Peggy Dones  No results found for this or any previous visit (from the past 24 hours).  Assessment & Plan:  High-risk pregnancy: [id number] at [redacted]w[redacted]d with an Estimated Date of Delivery: 01/29/25   1) CHTN, stable, ASA, no meds  2) H/o pre-e/eclampsia  3) Prev c/s w/ high incision> per note not TOLAC candidate, ?earlier RCS  4) Seizures> on lamictal /keppra   5) Dep/anx/PTSD> on lamictal /effexor  6) H/O 40wk IUFD  7) H/O child w/ small VSD> note routed to Tish to order fetal echo  8) Recent +CT> poc on pap today  9) Recent ASB> urine cx poc today  10) Cervical cancer screen> pap today  Meds: No orders of the defined types were placed in this encounter.   Labs/procedures today: pap, GC/CT, U/S, and urine culture  Treatment Plan:    2x/wk nst or weekly bpp @ 32wks   Deliver @ 38-39.6wks:______   Reviewed: Preterm labor symptoms and general obstetric precautions including but not limited to  vaginal bleeding, contractions, leaking of fluid and fetal movement were reviewed in detail with the patient.  All questions were answered. Does have home bp cuff. Office bp cuff given: not applicable. Check bp weekly, let us  know if consistently >140 and/or >90.  Follow-up: Return in about 4 weeks (around 10/10/2024) for HROB, US :EFW, in person, MD only (for next visit).   Future Appointments  Date Time Provider Department Center  10/10/2024  1:30 PM [facility] US  [facility] None  10/10/2024  2:30 PM Eure, Vonn DEL, MD [facility]    Orders Placed This Encounter  Procedures   Urine Culture   US  OB Comp + [address] [place], [facility] 09/12/2024 4:57 PM

## 2024-09-13 ENCOUNTER — Other Ambulatory Visit: Payer: Self-pay | Admitting: Women's Health

## 2024-09-13 DIAGNOSIS — Z8279 Family history of other congenital malformations, deformations and chromosomal abnormalities: Secondary | ICD-10-CM

## 2024-09-13 DIAGNOSIS — Z3A20 20 weeks gestation of pregnancy: Secondary | ICD-10-CM

## 2024-09-13 DIAGNOSIS — O0990 Supervision of high risk pregnancy, unspecified, unspecified trimester: Secondary | ICD-10-CM

## 2024-09-14 ENCOUNTER — Ambulatory Visit: Payer: Self-pay | Admitting: Women's Health

## 2024-09-14 DIAGNOSIS — R8271 Bacteriuria: Secondary | ICD-10-CM

## 2024-09-14 DIAGNOSIS — O0990 Supervision of high risk pregnancy, unspecified, unspecified trimester: Secondary | ICD-10-CM

## 2024-09-14 DIAGNOSIS — A749 Chlamydial infection, unspecified: Secondary | ICD-10-CM

## 2024-09-14 LAB — URINE CULTURE

## 2024-09-14 MED ORDER — NITROFURANTOIN MONOHYD MACRO 100 MG PO CAPS: 100.0000 mg | ORAL_CAPSULE | Freq: Two times a day (BID) | ORAL | 0 refills | Status: DC

## 2024-09-15 LAB — CYTOLOGY - PAP
Chlamydia: NEGATIVE
Comment: NEGATIVE
Comment: NEGATIVE
Comment: NORMAL
Diagnosis: NEGATIVE
High risk HPV: NEGATIVE
Neisseria Gonorrhea: NEGATIVE

## 2024-10-09 ENCOUNTER — Other Ambulatory Visit: Payer: Self-pay | Admitting: Women's Health

## 2024-10-09 DIAGNOSIS — Z8279 Family history of other congenital malformations, deformations and chromosomal abnormalities: Secondary | ICD-10-CM

## 2024-10-09 DIAGNOSIS — Z8759 Personal history of other complications of pregnancy, childbirth and the puerperium: Secondary | ICD-10-CM

## 2024-10-09 DIAGNOSIS — Z3A20 20 weeks gestation of pregnancy: Secondary | ICD-10-CM

## 2024-10-09 DIAGNOSIS — O0992 Supervision of high risk pregnancy, unspecified, second trimester: Secondary | ICD-10-CM

## 2024-10-09 DIAGNOSIS — O10919 Unspecified pre-existing hypertension complicating pregnancy, unspecified trimester: Secondary | ICD-10-CM

## 2024-10-09 DIAGNOSIS — O99891 Other specified diseases and conditions complicating pregnancy: Secondary | ICD-10-CM

## 2024-10-09 DIAGNOSIS — O0990 Supervision of high risk pregnancy, unspecified, unspecified trimester: Secondary | ICD-10-CM

## 2024-10-09 DIAGNOSIS — Z124 Encounter for screening for malignant neoplasm of cervix: Secondary | ICD-10-CM

## 2024-10-09 DIAGNOSIS — F418 Other specified anxiety disorders: Secondary | ICD-10-CM

## 2024-10-09 DIAGNOSIS — A749 Chlamydial infection, unspecified: Secondary | ICD-10-CM

## 2024-10-10 ENCOUNTER — Ambulatory Visit

## 2024-10-10 ENCOUNTER — Ambulatory Visit (INDEPENDENT_AMBULATORY_CARE_PROVIDER_SITE_OTHER): Admitting: Obstetrics & Gynecology

## 2024-10-10 VITALS — BP 110/72 | HR 89 | Wt 240.0 lb

## 2024-10-10 DIAGNOSIS — O34219 Maternal care for unspecified type scar from previous cesarean delivery: Secondary | ICD-10-CM

## 2024-10-10 DIAGNOSIS — O0992 Supervision of high risk pregnancy, unspecified, second trimester: Secondary | ICD-10-CM

## 2024-10-10 DIAGNOSIS — O10012 Pre-existing essential hypertension complicating pregnancy, second trimester: Secondary | ICD-10-CM | POA: Diagnosis not present

## 2024-10-10 DIAGNOSIS — O10912 Unspecified pre-existing hypertension complicating pregnancy, second trimester: Secondary | ICD-10-CM

## 2024-10-10 DIAGNOSIS — O0990 Supervision of high risk pregnancy, unspecified, unspecified trimester: Secondary | ICD-10-CM

## 2024-10-10 DIAGNOSIS — O2342 Unspecified infection of urinary tract in pregnancy, second trimester: Secondary | ICD-10-CM | POA: Diagnosis not present

## 2024-10-10 DIAGNOSIS — Z8759 Personal history of other complications of pregnancy, childbirth and the puerperium: Secondary | ICD-10-CM | POA: Diagnosis not present

## 2024-10-10 DIAGNOSIS — Z3A24 24 weeks gestation of pregnancy: Secondary | ICD-10-CM | POA: Diagnosis not present

## 2024-10-10 DIAGNOSIS — O10919 Unspecified pre-existing hypertension complicating pregnancy, unspecified trimester: Secondary | ICD-10-CM

## 2024-10-10 MED ORDER — SULFAMETHOXAZOLE-TRIMETHOPRIM 800-160 MG PO TABS: 1.0000 | ORAL_TABLET | Freq: Two times a day (BID) | ORAL | 0 refills | Status: DC

## 2024-10-10 NOTE — Progress Notes (Signed)
 US  24+1 wks,breech,anterior placenta gr 0,CX 4.6 cm,SVP of fluid 4.9 cm,EFW 686 g 50%

## 2024-10-10 NOTE — Progress Notes (Signed)
 [facility] PREGNANCY VISIT Patient name: Erin Krueger MRN [id number]  Date of birth: June 01, 1996 Chief Complaint:   Routine Prenatal Visit  History of Present Illness:   Erin Krueger is a 28 y.o. [id number] female at [redacted]w[redacted]d with an Estimated Date of Delivery: 01/29/25 being seen today for ongoing management of a high-risk pregnancy complicated by     ICD-10-CM   1. Supervision of high risk pregnancy, antepartum  O09.90     2. Chronic hypertension affecting pregnancy  O10.919     3. Urinary tract infection in mother during second trimester of pregnancy: Bactrim  DS BID x 7d  O23.42     4. Previous cesarean delivery affecting pregnancy, antepartum: needs repeat 37 weeks, high transverse  O34.219      .    Today she reports no complaints. Contractions: Not present. Vag. Bleeding: None.  Movement: Present. denies leaking of fluid.      07/20/2024    3:43 PM 06/17/2022   10:05 AM 03/26/2022    2:13 PM 03/06/2022    9:48 AM 01/22/2022    4:50 PM  Depression screen PHQ 2/9  Decreased Interest 0 0   0  Down, Depressed, Hopeless 0 0   0  PHQ - 2 Score 0 0   0  Altered sleeping 0 3   0  Tired, decreased energy 0 3   0  Change in appetite 0 0   0  Feeling bad or failure about yourself  0 0   0  Trouble concentrating 0 3   0  Moving slowly or fidgety/restless 0 0   0  Suicidal thoughts 0 0   0  PHQ-9 Score 0  9    0   Difficult doing work/chores          Information is confidential and restricted. Go to Review Flowsheets to unlock data.   Data saved with a previous flowsheet row definition        07/20/2024    3:43 PM 06/17/2022   10:07 AM 03/26/2022    2:15 PM 03/06/2022    9:24 AM  GAD 7 : Generalized Anxiety Score  Nervous, Anxious, on Edge 0 0    Control/stop worrying 0 0    Worry too much - different things 0 0    Trouble relaxing 0 1    Restless 0 3    Easily annoyed or irritable 0 0    Afraid - awful might happen 0 0    Total GAD 7 Score 0 4    Anxiety Difficulty          Information is confidential and restricted. Go to Review Flowsheets to unlock data.     Review of Systems:   Pertinent items are noted in HPI Denies abnormal vaginal discharge w/ itching/odor/irritation, headaches, visual changes, shortness of breath, chest pain, abdominal pain, severe nausea/vomiting, or problems with urination or bowel movements unless otherwise stated above. Pertinent History Reviewed:  Reviewed past medical,surgical, social, obstetrical and family history.  Reviewed problem list, medications and allergies. Physical Assessment:   Vitals:   10/10/24 1418  BP: 110/72  Pulse: 89  Weight: 240 lb (108.9 kg)  Body mass index is 37.59 kg/m.           Physical Examination:   General appearance: alert, well appearing, and in no distress  Mental status: alert, oriented to person, place, and time  Skin: warm & dry   Extremities:  Cardiovascular: normal heart rate noted  Respiratory: normal respiratory effort, no distress  Abdomen: gravid, soft, non-tender  Pelvic: Cervical exam deferred         Fetal Status:     Movement: Present    Fetal Surveillance Testing today: sonogram   Chaperone:     No results found for this or any previous visit (from the past 24 hours).  Assessment & Plan:  High-risk pregnancy: [id number] at [redacted]w[redacted]d with an Estimated Date of Delivery: 01/29/25      ICD-10-CM   1. Supervision of high risk pregnancy, antepartum  O09.90     2. Chronic hypertension affecting pregnancy  O10.919     3. Urinary tract infection in mother during second trimester of pregnancy: Bactrim  DS BID x 7d  O23.42     4. Previous cesarean delivery affecting pregnancy, antepartum: needs repeat 37 weeks, high transverse  O34.219          Meds:  Meds ordered this encounter  Medications   sulfamethoxazole -trimethoprim  (BACTRIM  DS) 800-160 MG tablet    Sig: Take 1 tablet by mouth 2 (two) times daily.    Dispense:  14 tablet    Refill:  0    Orders: No  orders of the defined types were placed in this encounter.    Labs/procedures today: U/S  Treatment Plan:  EFW 4 weeks    Follow-up: Return in about 4 weeks (around 11/07/2024) for sonogram fetal weight, HROB.   No future appointments.  No orders of the defined types were placed in this encounter.  Vonn VEAR Inch  Attending Physician for the Center for [facility] 10/10/2024 2:48 PM

## 2024-10-24 ENCOUNTER — Encounter (HOSPITAL_COMMUNITY): Payer: Self-pay | Admitting: Obstetrics and Gynecology

## 2024-10-24 ENCOUNTER — Other Ambulatory Visit: Payer: Self-pay

## 2024-10-24 ENCOUNTER — Inpatient Hospital Stay (HOSPITAL_COMMUNITY)
Admission: AD | Admit: 2024-10-24 | Discharge: 2024-10-24 | Disposition: A | Discharge status: Home / Self Care | Attending: Obstetrics and Gynecology | Admitting: Obstetrics and Gynecology

## 2024-10-24 DIAGNOSIS — O99891 Other specified diseases and conditions complicating pregnancy: Secondary | ICD-10-CM

## 2024-10-24 DIAGNOSIS — O10013 Pre-existing essential hypertension complicating pregnancy, third trimester: Secondary | ICD-10-CM | POA: Insufficient documentation

## 2024-10-24 DIAGNOSIS — Z3A26 26 weeks gestation of pregnancy: Secondary | ICD-10-CM | POA: Insufficient documentation

## 2024-10-24 DIAGNOSIS — O10919 Unspecified pre-existing hypertension complicating pregnancy, unspecified trimester: Secondary | ICD-10-CM

## 2024-10-24 DIAGNOSIS — R102 Pelvic and perineal pain unspecified side: Secondary | ICD-10-CM | POA: Diagnosis not present

## 2024-10-24 DIAGNOSIS — O26892 Other specified pregnancy related conditions, second trimester: Secondary | ICD-10-CM | POA: Insufficient documentation

## 2024-10-24 LAB — COMPREHENSIVE METABOLIC PANEL WITH GFR
ALT: 8 U/L (ref 0–44)
AST: 14 U/L — ABNORMAL LOW (ref 15–41)
Albumin: 2.4 g/dL — ABNORMAL LOW (ref 3.5–5.0)
Alkaline Phosphatase: 82 U/L (ref 38–126)
Anion gap: 12 (ref 5–15)
BUN: 7 mg/dL (ref 6–20)
CO2: 22 mmol/L (ref 22–32)
Calcium: 8.9 mg/dL (ref 8.9–10.3)
Chloride: 102 mmol/L (ref 98–111)
Creatinine, Ser: 0.63 mg/dL (ref 0.44–1.00)
GFR, Estimated: >60 mL/min (ref >60)
Glucose, Bld: 95 mg/dL (ref 70–99)
Potassium: 3.6 mmol/L (ref 3.5–5.1)
Sodium: 136 mmol/L (ref 135–145)
Total Bilirubin: 0.3 mg/dL (ref 0.0–1.2)
Total Protein: 5.8 g/dL — ABNORMAL LOW (ref 6.5–8.1)

## 2024-10-24 LAB — CBC
HCT: 33.1 % — ABNORMAL LOW (ref 36.0–46.0)
Hemoglobin: 11.1 g/dL — ABNORMAL LOW (ref 12.0–15.0)
MCH: 29.2 pg (ref 26.0–34.0)
MCHC: 33.5 g/dL (ref 30.0–36.0)
MCV: 87.1 fL (ref 80.0–100.0)
Platelets: 223 K/uL (ref 150–400)
RBC: 3.8 MIL/uL — ABNORMAL LOW (ref 3.87–5.11)
RDW: 12.6 % (ref 11.5–15.5)
WBC: 12.1 K/uL — ABNORMAL HIGH (ref 4.0–10.5)
nRBC: 0 % (ref 0.0–0.2)

## 2024-10-24 LAB — URINALYSIS, ROUTINE W REFLEX MICROSCOPIC
Bilirubin Urine: NEGATIVE
Glucose, UA: NEGATIVE mg/dL
Hgb urine dipstick: NEGATIVE
Ketones, ur: NEGATIVE mg/dL
Nitrite: NEGATIVE
Protein, ur: NEGATIVE mg/dL
Specific Gravity, Urine: 1.016 (ref 1.005–1.030)
pH: 7 (ref 5.0–8.0)

## 2024-10-24 LAB — PROTEIN / CREATININE RATIO, URINE
Creatinine, Urine: 144 mg/dL
Protein Creatinine Ratio: 0.11 mg/mg{creat} (ref 0.00–0.15)
Total Protein, Urine: 16 mg/dL

## 2024-10-24 MED ORDER — ACETAMINOPHEN 500 MG PO TABS: 1000.0000 mg | ORAL_TABLET | Freq: Four times a day (QID) | ORAL | 1 refills | Status: DC | PRN

## 2024-10-24 MED ORDER — CYCLOBENZAPRINE HCL 10 MG PO TABS: 10.0000 mg | ORAL_TABLET | Freq: Three times a day (TID) | ORAL | 2 refills | Status: DC | PRN

## 2024-10-24 MED ORDER — CYCLOBENZAPRINE HCL 5 MG PO TABS
10.0000 mg | ORAL_TABLET | Freq: Once | ORAL | Status: AC
  Administered 2024-10-24: 10 mg via ORAL
  Filled 2024-10-24: qty 2

## 2024-10-24 MED ORDER — ACETAMINOPHEN 500 MG PO TABS
1000.0000 mg | ORAL_TABLET | Freq: Once | ORAL | Status: AC
  Administered 2024-10-24: 1000 mg via ORAL
  Filled 2024-10-24: qty 2

## 2024-10-24 NOTE — MAU Provider Note (Cosign Needed Addendum)
 Chief Complaint:  Vaginal Pain, pelvic pressure, and Left Hip Pain   HPI   None     Erin Krueger is a 28 y.o. [id number] at [redacted]w[redacted]d who presents to maternity admissions reporting vaginal pain. She reports pain over her pubic symphysis and left hip for the past week that has worsened today. The pain is a 2-3/10 when sitting and not moving, but increased to 8/10 when moving. She is close to being unable to walk without assistance due to pain. She has not experienced this pain before. She denies vaginal discharge, vaginal bleeding, leaking of fluid.   Pregnancy Course: Receives care at [facility] for [facility] . Prenatal records reviewed. Pregnancy complicated by chronic hypertension, seizures (managed by neurology), history of preeclampsia and eclampsia.  Past Medical History:  Diagnosis Date   ADD (attention deficit disorder)    ADHD (attention deficit hyperactivity disorder)    Adopted person 10/04/2019   Does not know personal family history other than Diabetes, Breast Cancer. Raised in foster care   Allergy    Anxiety    Asthma    Epilepsy (HCC)    Headache(784.0)    History of eclampsia    History of pre-eclampsia    Kidney stone    kidney stones   Migraines    ODD (oppositional defiant disorder)    PID (acute pelvic inflammatory disease) 01/29/2016   Pregnancy induced hypertension    PTSD (post-traumatic stress disorder)    PTSD (post-traumatic stress disorder)    Seizures (HCC)    diagnosed age 71   OB History  Gravida Para Term Preterm AB Living  12 6 4 2 3 5   SAB IAB Ectopic Multiple Live Births  3   0 5    # Outcome Date GA Lbr Len/2nd Weight Sex Type Anes PTL Lv  12 Current           11 Term 01/09/23 [redacted]w[redacted]d  3033 g F CS-LTranv  N LIV     Complications: Umbilical cord prolapse  10 Preterm 01/24/22 [redacted]w[redacted]d 14:40 / 00:12 2930 g F Vag-Spont EPI  LIV  9 SAB 2022             Birth Comments: states didn't go to doctor, had positive home  pregnancy test ,  then bled and passed tissue- didn't go to appt because of history of 2 sab  8 Preterm 12/28/19 [redacted]w[redacted]d 08:08 / 00:05 2790 g M Vag-Spont EPI  LIV     Complications: Eclampsia  7 Term 11/08/18 [redacted]w[redacted]d 00:15 / 00:18 3275 g M Vag-Spont EPI  LIV     Birth Comments: WNL  6 Term 06/23/17 [redacted]w[redacted]d  2948 g F Vag-Spont EPI N LIV     Birth Comments: baby had cardiac defect, otherwise wnl  5 SAB 07/2014 [redacted]w[redacted]d            Birth Comments: had D&C  4 Term 2014 [redacted]w[redacted]d       FD     Birth Comments: stillbirth  3 SAB 2014 [redacted]w[redacted]d         2 Gravida           1 Gravida            Past Surgical History:  Procedure Laterality Date   CESAREAN SECTION  01/09/2023   DILATION AND CURETTAGE OF UTERUS     kidney stone removed  03/06/2024   right knee surgery     stent in left kidney  02/20/2024   TONSILLECTOMY  AND ADENOIDECTOMY     Family History  Adopted: Yes  Problem Relation Age of Onset   Diabetes Sister    Other Daughter        born with hole in heart   Other Son        born with hole in heart   Breast cancer Maternal Grandmother    Diabetes Maternal Grandmother    Glaucoma Maternal Grandmother    Epilepsy Maternal Grandmother    Social History   Tobacco Use   Smoking status: Former    Current packs/day: 0.00    Types: Cigarettes, E-cigarettes    Quit date: 04/30/2021    Years since quitting: 3.4   Smokeless tobacco: Never   Tobacco comments:    nicotine  patch  Vaping Use   Vaping status: Former   Substances: CBD  Substance Use Topics   Alcohol use: Not Currently    Comment: drinks beer on weekends when not pregnant   Drug use: Not Currently    Types: Marijuana, Cocaine    Comment: Not since 2018   Allergies  Allergen Reactions   Bee Venom Anaphylaxis   Ceftriaxone  Other (See Comments), Hives and Itching   Penicillins Anaphylaxis, Dermatitis, Hives and Itching    * Has tolerated several cephalosporins*  Has patient had a PCN reaction causing immediate rash, facial/tongue/throat swelling,  SOB or lightheadedness with hypotension: Unknown  Has patient had a PCN reaction causing severe rash involving mucus membranes or skin necrosis: Unknown  Has patient had a PCN reaction that required hospitalization: Unknown  Has patient had a PCN reaction occurring within the last 10 years: Unknown  If all of the above answers are NO, then may proceed with Cephalosporin use.  Has patient had a PCN reaction causing immediate rash, facial/tongue/throat swelling, SOB or lightheadedness with hypotension: Unknown    Has patient had a PCN reaction causing severe rash involving mucus membranes or skin necrosis: Unknown    Has patient had a PCN reaction that required hospitalization: Unknown    Has patient had a PCN reaction occurring within the last 10 years: Unknown    If all of the above answers are NO, then may proceed with Cephalosporin use.    * Has tolerated several cephalosporins* Has patient had a PCN reaction causing immediate rash, facial/tongue/throat swelling, SOB or lightheadedness with hypotension: Unknown Has patient had a PCN reaction causing severe rash involving mucus membranes or skin necrosis: Unknown Has patient had a PCN reaction that required hospitalization: Unknown Has patient had a PCN reaction occurring within the last 10 years: Unknown If all of the above answers are NO, then may proceed with Cephalosporin use.    Has tolerated cephalosporins   Lorazepam  Other (See Comments)    Aggressive-agitation   Prednisone  Other (See Comments)    Bleeding nose bleeding and internal bleeding  Bleeding nose bleeding and internal bleeding    Nose bleed    Other reaction(s): Other (See Comments) weakness    Nose bleed , weakness    Unknown to pt, reconciled from outside source   Aztreonam Itching   Latex Rash   Medications Prior to Admission  Medication Sig Dispense Refill Last Dose/Taking   Acetaminophen  (TYLENOL  PO) Take by mouth.   10/24/2024 at 12:00 PM    ALBUTEROL  SULFATE IN Inhale into the lungs.   Past Month   aspirin  EC 81 MG tablet Take 1 tablet (81 mg total) by mouth daily. 30 tablet 6 10/23/2024   lamoTRIgine  (LAMICTAL ) 100 MG  tablet TAKE 1 TABLET BY MOUTH TWICE A DAY 60 tablet 6 10/24/2024   levETIRAcetam  (KEPPRA ) 750 MG tablet TAKE 2 TABLETS (1,500 MG TOTAL) BY MOUTH 2 (TWO) TIMES DAILY. 360 tablet 1 10/24/2024   ondansetron  (ZOFRAN -ODT) 4 MG disintegrating tablet Take 1 tablet (4 mg total) by mouth every 6 (six) hours as needed for nausea. 30 tablet 2 10/23/2024   Prenatal Vit-Fe Fumarate-FA (PRENATAL VITAMIN PO) Take 1 capsule by mouth daily.   10/24/2024   sulfamethoxazole -trimethoprim  (BACTRIM  DS) 800-160 MG tablet Take 1 tablet by mouth 2 (two) times daily. 14 tablet 0 Past Week   venlafaxine XR (EFFEXOR-XR) 75 MG 24 hr capsule Take 75 mg by mouth daily.   10/24/2024   Blood Pressure Monitor MISC For regular home bp monitoring during pregnancy 1 each 0    EPINEPHrine  0.3 mg/0.3 mL IJ SOAJ injection Inject 0.3 mg into the muscle as needed for anaphylaxis. 1 each 2     I have reviewed patient's Past Medical Hx, Surgical Hx, Family Hx, Social Hx, medications and allergies.   ROS  Pertinent items noted in HPI and remainder of comprehensive ROS otherwise negative.   PHYSICAL EXAM  Patient Vitals for the past 24 hrs:  BP Temp Temp src Pulse SpO2 Height Weight  10/24/24 1700 119/62 -- -- -- -- -- --  10/24/24 1550 -- -- -- -- 98 % -- --  10/24/24 1546 129/82 -- -- (!) 101 -- -- --  10/24/24 1511 (!) 147/107 98 F (36.7 C) Oral (!) 105 100 % 5' 6 (1.676 m) 106.6 kg    Constitutional: Well-developed, well-nourished female in no acute distress.  HEENT: atraumatic, normocephalic. Neck has normal ROM. EOM intact. Cardiovascular: normal rate & rhythm, warm and well-perfused Respiratory: normal effort, no problems with respiration noted GI: Abd soft, non-tender, non-distended MSK: Extremities nontender, no edema, normal ROM but pain with  left hip movements in particular. Skin: warm and dry. Acyanotic, no jaundice or pallor. Neurologic: Alert and oriented x 4. No abnormal coordination. Psychiatric: Normal mood. Speech not slurred, not rapid/pressured. Patient is cooperative.    Fetal Tracing: Baseline FHR: 150 per minute Fetal heart variability: moderate Fetal Heart Rate accelerations: yes - 10x10, appropriate for gestational age Fetal Heart Rate decelerations: none Fetal Non-stress Test: Category I (reactive) Toco: no uterine contractions   Labs: Results for orders placed or performed during the hospital encounter of 10/24/24 (from the past 24 hours)  Urinalysis, Routine w reflex microscopic -Urine, Clean Catch     Status: Abnormal   Collection Time: 10/24/24  3:35 PM  Result Value Ref Range   Color, Urine YELLOW YELLOW   APPearance CLOUDY (A) CLEAR   Specific Gravity, Urine 1.016 1.005 - 1.030   pH 7.0 5.0 - 8.0   Glucose, UA NEGATIVE NEGATIVE mg/dL   Hgb urine dipstick NEGATIVE NEGATIVE   Bilirubin Urine NEGATIVE NEGATIVE   Ketones, ur NEGATIVE NEGATIVE mg/dL   Protein, ur NEGATIVE NEGATIVE mg/dL   Nitrite NEGATIVE NEGATIVE   Leukocytes,Ua SMALL (A) NEGATIVE   RBC / HPF 0-5 0 - 5 RBC/hpf   WBC, UA 0-5 0 - 5 WBC/hpf   Bacteria, UA RARE (A) NONE SEEN   Squamous Epithelial / HPF 0-5 0 - 5 /HPF   Mucus PRESENT    Amorphous Crystal PRESENT   Protein / creatinine ratio, urine     Status: None   Collection Time: 10/24/24  3:35 PM  Result Value Ref Range   Creatinine, Urine 144 mg/dL  Total Protein, Urine 16 mg/dL   Protein Creatinine Ratio 0.11 0.00 - 0.15 mg/mg[Cre]  CBC     Status: Abnormal   Collection Time: 10/24/24  3:46 PM  Result Value Ref Range   WBC 12.1 (H) 4.0 - 10.5 K/uL   RBC 3.80 (L) 3.87 - 5.11 MIL/uL   Hemoglobin 11.1 (L) 12.0 - 15.0 g/dL   HCT 66.8 (L) 63.9 - 53.9 %   MCV 87.1 80.0 - 100.0 fL   MCH 29.2 26.0 - 34.0 pg   MCHC 33.5 30.0 - 36.0 g/dL   RDW 87.3 88.4 - 84.4 %   Platelets  223 150 - 400 K/uL   nRBC 0.0 0.0 - 0.2 %  Comprehensive metabolic panel     Status: Abnormal   Collection Time: 10/24/24  3:46 PM  Result Value Ref Range   Sodium 136 135 - 145 mmol/L   Potassium 3.6 3.5 - 5.1 mmol/L   Chloride 102 98 - 111 mmol/L   CO2 22 22 - 32 mmol/L   Glucose, Bld 95 70 - 99 mg/dL   BUN 7 6 - 20 mg/dL   Creatinine, Ser 9.36 0.44 - 1.00 mg/dL   Calcium  8.9 8.9 - 10.3 mg/dL   Total Protein 5.8 (L) 6.5 - 8.1 g/dL   Albumin 2.4 (L) 3.5 - 5.0 g/dL   AST 14 (L) 15 - 41 U/L   ALT 8 0 - 44 U/L   Alkaline Phosphatase 82 38 - 126 U/L   Total Bilirubin 0.3 0.0 - 1.2 mg/dL   GFR, Estimated >39 >39 mL/min   Anion gap 12 5 - 15    Imaging:  No results found.  MDM & MAU COURSE  MDM: Moderate  MAU Course: -Elevated BP initially but normalized on recheck. BP has been normal throughout pregnancy without medication until this reading, need to differentiate worsening HTN vs preeclampsia vs transient elevation due to pain with movement. -CMP, CBC, urine protein/creatinine ratio to rule out preeclampsia.  -Trial Tylenol  and Flexeril  for suspected pubic symphysis dysfunction. If ineffective, offered referral to PT. -CBC with mild anemia. -urine protein/creatinine ratio within normal limits. UA negative for infection. -CMP within normal limits for pregnancy. -After medication, pain at rest improved to 0/10 and pain with movement improved to 3/10. -Discussed with Dr. Eldonna, agrees initial elevated BP is most likely a reaction to pain. Continue Baby Scripts monitoring for BP.  Differential diagnosis considered for elevated blood pressure includes but is not limited to: high risk conditions like preeclampsia; chronic hypertension; transient spurious elevated blood pressure   Orders Placed This Encounter  Procedures   Urinalysis, Routine w reflex microscopic -Urine, Clean Catch   CBC   Comprehensive metabolic panel   Protein / creatinine ratio, urine   Discharge patient    Meds ordered this encounter  Medications   acetaminophen  (TYLENOL ) tablet 1,000 mg   cyclobenzaprine  (FLEXERIL ) tablet 10 mg   acetaminophen  (TYLENOL ) 500 MG tablet    Sig: Take 2 tablets (1,000 mg total) by mouth every 6 (six) hours as needed for mild pain (pain score 1-3) or moderate pain (pain score 4-6).    Dispense:  60 tablet    Refill:  1   cyclobenzaprine  (FLEXERIL ) 10 MG tablet    Sig: Take 1 tablet (10 mg total) by mouth 3 (three) times daily as needed for muscle spasms.    Dispense:  60 tablet    Refill:  2    ASSESSMENT   1. Pain in symphysis pubis  during pregnancy   2. Chronic hypertension affecting pregnancy   3. [redacted] weeks gestation of pregnancy     PLAN  Discharge home in stable condition with return precautions.  Tylenol  and Flexeril  prn for pubic symphysis pain. Discussed that physical therapy is helpful as well, patient would like to try to manage with medication first but will let her OB know if she wants a referral to PT.  Continue Baby Scripts twice daily for BP monitoring.    Allergies as of 10/24/2024       Reactions   Bee Venom Anaphylaxis   Ceftriaxone  Other (See Comments), Hives, Itching   Penicillins Anaphylaxis, Dermatitis, Hives, Itching   * Has tolerated several cephalosporins* Has patient had a PCN reaction causing immediate rash, facial/tongue/throat swelling, SOB or lightheadedness with hypotension: Unknown Has patient had a PCN reaction causing severe rash involving mucus membranes or skin necrosis: Unknown Has patient had a PCN reaction that required hospitalization: Unknown Has patient had a PCN reaction occurring within the last 10 years: Unknown If all of the above answers are NO, then may proceed with Cephalosporin use. Has patient had a PCN reaction causing immediate rash, facial/tongue/throat swelling, SOB or lightheadedness with hypotension: Unknown    Has patient had a PCN reaction causing severe rash involving mucus membranes or  skin necrosis: Unknown    Has patient had a PCN reaction that required hospitalization: Unknown    Has patient had a PCN reaction occurring within the last 10 years: Unknown    If all of the above answers are NO, then may proceed with Cephalosporin use.    * Has tolerated several cephalosporins* Has patient had a PCN reaction causing immediate rash, facial/tongue/throat swelling, SOB or lightheadedness with hypotension: Unknown Has patient had a PCN reaction causing severe rash involving mucus membranes or skin necrosis: Unknown Has patient had a PCN reaction that required hospitalization: Unknown Has patient had a PCN reaction occurring within the last 10 years: Unknown If all of the above answers are NO, then may proceed with Cephalosporin use.    Has tolerated cephalosporins   Lorazepam  Other (See Comments)   Aggressive-agitation   Prednisone  Other (See Comments)   Bleeding nose bleeding and internal bleeding Bleeding nose bleeding and internal bleeding    Nose bleed    Other reaction(s): Other (See Comments) weakness    Nose bleed , weakness    Unknown to pt, reconciled from outside source   Aztreonam Itching   Latex Rash        Medication List     TAKE these medications    acetaminophen  500 MG tablet Commonly known as: TYLENOL  Take 2 tablets (1,000 mg total) by mouth every 6 (six) hours as needed for mild pain (pain score 1-3) or moderate pain (pain score 4-6). What changed:  medication strength how much to take when to take this reasons to take this   ALBUTEROL  SULFATE IN Inhale into the lungs.   aspirin  EC 81 MG tablet Take 1 tablet (81 mg total) by mouth daily.   Blood Pressure Monitor Misc For regular home bp monitoring during pregnancy   cyclobenzaprine  10 MG tablet Commonly known as: FLEXERIL  Take 1 tablet (10 mg total) by mouth 3 (three) times daily as needed for muscle spasms.   EPINEPHrine  0.3 mg/0.3 mL Soaj injection Commonly known as:  EPI-PEN Inject 0.3 mg into the muscle as needed for anaphylaxis.   lamoTRIgine  100 MG tablet Commonly known as: LAMICTAL  TAKE 1 TABLET BY MOUTH TWICE  A DAY   levETIRAcetam  750 MG tablet Commonly known as: KEPPRA  TAKE 2 TABLETS (1,500 MG TOTAL) BY MOUTH 2 (TWO) TIMES DAILY.   ondansetron  4 MG disintegrating tablet Commonly known as: ZOFRAN -ODT Take 1 tablet (4 mg total) by mouth every 6 (six) hours as needed for nausea.   PRENATAL VITAMIN PO Take 1 capsule by mouth daily.   sulfamethoxazole -trimethoprim  800-160 MG tablet Commonly known as: BACTRIM  DS Take 1 tablet by mouth 2 (two) times daily.   venlafaxine XR 75 MG 24 hr capsule Commonly known as: EFFEXOR-XR Take 75 mg by mouth daily.        Joesph DELENA Sear, PA

## 2024-10-24 NOTE — MAU Note (Signed)
 Erin Krueger is a 28 y.o. at [redacted]w[redacted]d here in MAU reporting: ongoing vaginal pain and pelvic pressure for the last week but is worse than usual today. Left hip pain with movement and makes a popping sound or she can feel it pop. Pain right on her pubic bone that is worse with movement. Denies any LOF or VB. Reports feeling some fetal movement but it is not consistent. Reports her usual clear vaginal discharge. Patient denies any unusual vaginal discharge, vaginal odor, itching or pain. Patient denies any pain with urination or urinary frequency. Reports finishing her abx 2 days ago for a UTI. Denies any recent sexual intercourse.   Feels like she is unable to walk without assistance.   Onset of complaint: worse today  Pain score: sitting/not moving-3  up moving/walking-8 Vitals:   10/24/24 1511  BP: (!) 147/107  Pulse: (!) 105  Temp: 98 F (36.7 C)  SpO2: 100%      Lab orders placed from triage:  UA

## 2024-10-24 NOTE — Discharge Instructions (Addendum)
 You can continue checking your blood pressure with [facility]!  Use the Tylenol  and Flexeril  as prescribed and as needed for pain. If it worsens or is no longer helped with medicine, let you OB know that you would like a referral to physical therapy.  7 Best Exercises For SPD In Pregnancy (To Help You Get Relief) - Postpartum Trainer, MD   What is Symphysis Pubis Dysfunction (SPD)? SPD describes pain in the symphysis pubis joint at the front of the pelvic girdle. The discomfort is often felt right over the pubic bone at the front, below your tummy, around the sides of your hips or in your lower back. You may experience pain in all or some of the areas shaded in the diagrams below.This leaflet will help you understand more about it, how you can adapt your lifestyle and how you can look after yourself during and after your pregnancy and the labour process. SPD is common. The sooner it is identified and assessed the better it can be managed. About one in five pregnant women experience mild discomfort in the back or especially the front of the pelvis during pregnancy. If you have any symptoms that do not improve within a couple of days or interfere with your normal day-to-day life, you may have SPD and should ask for help from your midwife, GP or physiotherapist. Women experience different symptoms and these are more severe in some women than others. If you understand how SPD may be caused, what treatment is available, and how you can help yourself, this may speed up your recovery, reducing the impact of SPD on your life.  How is SPD diagnosed? The diagnosis of SPD is based on certain signs and symptoms which you may experience during your pregnancy or afterwards. Having one or more of them may indicate the need for a physiotherapy assessment followed by advice on appropriate management. You may also have: difficulty with walking pain when standing on one leg, e.g. climbing stairs, dressing or getting  in and out of the bath pain and / or difficulty moving your legs apart, e.g. getting in and out of a car clicking or grinding in the pelvic area - you may hear or feel this limited or painful hip movements e.g. turning in bed difficulty with lying in some positions, e.g. on your side pain during normal activities of daily life pain and difficulty during sexual intercourse.  With SPD the degree of discomfort you feel may vary from being intermittent and irritating to being very wearing and upsetting.  What causes SPD? Sometimes there is no obvious explanation for the cause of SPD. Usually it is a combination of factors, including: change in the activity of the muscles in your stomach, pelvis, hip and pelvic floor, which can lead to the pelvic girdle becoming less stable; a previous fall, accident or weakness that has damaged your pelvis or hips; hormones released during pregnancy can lead to increased looseness in all ligaments and muscles throughout the body, destabilising joints; occasionally, the position of the baby may produce symptoms related to SPD. Susceptibility is increased if: you have had previous injury to your pelvis, you have had SPD in a previous pregnancy, you have a hard physical job or workload, you have increased body weight and body mass index before and / or by the end of the pregnancy.  How many women get SPD? This condition is common; about one in five women will have some pelvic pain. There is a wide range of symptoms  and in some women it is worse than in others, but having some symptoms does not mean you are automatically going to get worse. If you get the right treatment early during pregnancy, it can usually be managed well in some cases the symptoms will go completely. However, in a small percentage of women, SPD may persist longer after birth, particularly if left untreated.  Management You will need general advice to help you selfmanage your condition (see overleaf) and  you may need one or more of the following referrals:  your GP or midwife can refer you to the Physiotherapy Department for assessment of your pelvic joints, followed by treatment (as necessary) and advice on how to manage your condition  Physiotherapy treatment Physiotherapy aims to improve your spinal and pelvic joint position and stability, relieve pain and improve muscle function. Treatment may include: manual therapy to ensure your spinal, pelvic and hip joints are moving correctly exercises to stretch out tighter tissue and to help strengthen and improve stability of your stomach, back, pelvis and hips advice including: back care, lifting advice, suggested positions for labour and birth, looking after your baby or other toddlers, positions for sexual intercourse other types of pain relief, e.g. TENS, ice / heat exercises in water  provision of equipment such as pelvic support belts, crutches or wheelchairs. Your physiotherapist will see you during your pregnancy, as necessary. You may need several visits to control your pain and Information for patients and visitors improve your stability. If the pain persists treatment can continue after you have had your baby.   Exercise during pregnancy Take moderate exercise, but do not start new sporting activities Don't indulge in intensive or extensive periods of exercise Avoid high impact exercise such as running, racket sports and aerobics Swimming may be of benefit, but avoid breast stroke and leg kicks Walk with shorter strides than usual  Generally avoid any activity which increases your pelvic girdle pain  Tips during pregnancy Be as active as possible within pain limits    Avoid activities that make the pain worse Ask for and accept help with household chores and involve your partner, family and friends Rest when you can - you may need to rest and sit down more often Sit down to get dressed and undressed Avoid standing on one leg Wear  flat, supportive shoes Avoid standing to do tasks such as ironing Try to keep your knees together when moving in and out of the car - a plastic bag on the seat may help you swivel Sleep in a comfortable position, e.g. Lie on your side with a pillow between your legs Try different ways of turning in bed, e.g. turning under or over with your knees together and squeeze your buttocks Roll in and out of bed keeping your knees together Take the stairs one at a time (go upstairs leading with your less painful leg and downstairs with the more painful one, or go upstairs backwards, or on your bottom) Plan your day - bring everything you need downstairs in the morning and have everything to hand Consider alternative positions if you desire sexual intercourse, e.g. lying on your side or kneeling on all fours  Activities to avoid which will make the pain worse Standing on one leg Bending and twisting to lift or carry a toddler or baby on one hip Crossing your legs Sitting on the floor Sitting or standing for long periods Lifting heavy weights (shopping bags, wet washing, vacuum cleaners, toddlers) Vacuuming Pushing heavy objects like  supermarket trolleys or pushchairs, especially uphill Carrying anything in only one hand  After you have had your baby You should move about as much as possible after the baby, within the limits of your pain.  Be aware, medication to relieve pain may cover up the discomfort of your SPD, so be Information for patients and visitors careful and continue to avoid the aggravating activities, as you did before you had your baby.  Most women's SPD symptoms disappear in the week following the birth. If you still have symptoms 10-14 days after the birth, you should be referred to a physiotherapist for assessment and receive treatment from your midwife or GP. Remember, SPD is common and treatable. The sooner it is identified and assessed, the better it can be managed.    Contact  details for Further Information Physiotherapy Department, [facility], [place]: [phone]  [email or web address] Date of issue: June, 2015 Review Period: June, 2018 Author: Physiotherapy Department [id number]  NLGFT 2015

## 2024-10-30 ENCOUNTER — Encounter: Payer: Self-pay | Admitting: Women's Health

## 2024-11-01 ENCOUNTER — Other Ambulatory Visit: Payer: Self-pay | Admitting: Women's Health

## 2024-11-01 DIAGNOSIS — E669 Obesity, unspecified: Secondary | ICD-10-CM

## 2024-11-01 DIAGNOSIS — Z8279 Family history of other congenital malformations, deformations and chromosomal abnormalities: Secondary | ICD-10-CM

## 2024-11-01 DIAGNOSIS — Z124 Encounter for screening for malignant neoplasm of cervix: Secondary | ICD-10-CM

## 2024-11-01 DIAGNOSIS — A749 Chlamydial infection, unspecified: Secondary | ICD-10-CM

## 2024-11-01 DIAGNOSIS — O0992 Supervision of high risk pregnancy, unspecified, second trimester: Secondary | ICD-10-CM

## 2024-11-01 DIAGNOSIS — R8271 Bacteriuria: Secondary | ICD-10-CM

## 2024-11-01 DIAGNOSIS — F418 Other specified anxiety disorders: Secondary | ICD-10-CM

## 2024-11-01 DIAGNOSIS — O10919 Unspecified pre-existing hypertension complicating pregnancy, unspecified trimester: Secondary | ICD-10-CM

## 2024-11-01 DIAGNOSIS — O34219 Maternal care for unspecified type scar from previous cesarean delivery: Secondary | ICD-10-CM

## 2024-11-01 DIAGNOSIS — O0990 Supervision of high risk pregnancy, unspecified, unspecified trimester: Secondary | ICD-10-CM

## 2024-11-01 DIAGNOSIS — Z8759 Personal history of other complications of pregnancy, childbirth and the puerperium: Secondary | ICD-10-CM

## 2024-11-01 DIAGNOSIS — Z3A28 28 weeks gestation of pregnancy: Secondary | ICD-10-CM

## 2024-11-01 DIAGNOSIS — O09299 Supervision of pregnancy with other poor reproductive or obstetric history, unspecified trimester: Secondary | ICD-10-CM

## 2024-11-07 ENCOUNTER — Encounter: Payer: Self-pay | Admitting: Women's Health

## 2024-11-07 ENCOUNTER — Ambulatory Visit: Admitting: Women's Health

## 2024-11-07 ENCOUNTER — Other Ambulatory Visit: Admitting: Radiology

## 2024-11-07 VITALS — BP 126/58 | HR 105 | Wt 251.4 lb

## 2024-11-07 DIAGNOSIS — O09299 Supervision of pregnancy with other poor reproductive or obstetric history, unspecified trimester: Secondary | ICD-10-CM

## 2024-11-07 DIAGNOSIS — Z1389 Encounter for screening for other disorder: Secondary | ICD-10-CM

## 2024-11-07 DIAGNOSIS — O10919 Unspecified pre-existing hypertension complicating pregnancy, unspecified trimester: Secondary | ICD-10-CM

## 2024-11-07 DIAGNOSIS — Z23 Encounter for immunization: Secondary | ICD-10-CM

## 2024-11-07 DIAGNOSIS — O0992 Supervision of high risk pregnancy, unspecified, second trimester: Secondary | ICD-10-CM

## 2024-11-07 DIAGNOSIS — Z3A28 28 weeks gestation of pregnancy: Secondary | ICD-10-CM

## 2024-11-07 DIAGNOSIS — O99891 Other specified diseases and conditions complicating pregnancy: Secondary | ICD-10-CM | POA: Diagnosis not present

## 2024-11-07 DIAGNOSIS — G40909 Epilepsy, unspecified, not intractable, without status epilepticus: Secondary | ICD-10-CM

## 2024-11-07 DIAGNOSIS — O0993 Supervision of high risk pregnancy, unspecified, third trimester: Secondary | ICD-10-CM

## 2024-11-07 DIAGNOSIS — O10913 Unspecified pre-existing hypertension complicating pregnancy, third trimester: Secondary | ICD-10-CM

## 2024-11-07 DIAGNOSIS — O34219 Maternal care for unspecified type scar from previous cesarean delivery: Secondary | ICD-10-CM

## 2024-11-07 DIAGNOSIS — O321XX0 Maternal care for breech presentation, not applicable or unspecified: Secondary | ICD-10-CM | POA: Diagnosis not present

## 2024-11-07 DIAGNOSIS — Z8759 Personal history of other complications of pregnancy, childbirth and the puerperium: Secondary | ICD-10-CM

## 2024-11-07 DIAGNOSIS — E669 Obesity, unspecified: Secondary | ICD-10-CM

## 2024-11-07 DIAGNOSIS — O34218 Maternal care for other type scar from previous cesarean delivery: Secondary | ICD-10-CM | POA: Diagnosis not present

## 2024-11-07 DIAGNOSIS — Z331 Pregnant state, incidental: Secondary | ICD-10-CM

## 2024-11-07 DIAGNOSIS — R3989 Other symptoms and signs involving the genitourinary system: Secondary | ICD-10-CM | POA: Diagnosis not present

## 2024-11-07 DIAGNOSIS — O99353 Diseases of the nervous system complicating pregnancy, third trimester: Secondary | ICD-10-CM | POA: Diagnosis not present

## 2024-11-07 DIAGNOSIS — O0990 Supervision of high risk pregnancy, unspecified, unspecified trimester: Secondary | ICD-10-CM

## 2024-11-07 LAB — POCT URINALYSIS DIPSTICK OB
Glucose, UA: NEGATIVE
Nitrite, UA: NEGATIVE

## 2024-11-07 NOTE — Patient Instructions (Addendum)
 Ilee, thank you for choosing our office today! We appreciate the opportunity to meet your healthcare needs. You may receive a short survey by mail, e-mail, or through [employer]. If you are happy with your care we would appreciate if you could take just a few minutes to complete the survey questions. We read all of your comments and take your feedback very seriously. Thank you again for choosing our office.  Center for [employer] Team at [employer]  [facility] ([address] [place], [place] [address]) Entrance C, located off of [address] Free 24/7 valet parking   You will have your sugar test next visit.  Please do not eat or drink anything after midnight the night before you come, not even water .  You will be here for at least two hours.  Please make an appointment online for the bloodwork at [email or web address] for 8:30am (or as close to this as possible). Make sure you select the [facility] service center. The day of the appointment, check in with our office first, then you will go to [facility] to start the sugar test.    CLASSES: Go to [email or web address] to register for classes (childbirth, breastfeeding, waterbirth, infant CPR, daddy bootcamp, etc.)  Call the office [phone]) or go to [facility] if: You begin to have strong, frequent contractions Your water  breaks.  Sometimes it is a big gush of fluid, sometimes it is just a trickle that keeps getting your panties wet or running down your legs You have vaginal bleeding.  It is normal to have a small amount of spotting if your cervix was checked.  You don't feel your baby moving like normal.  If you don't, get you something to eat and drink and lay down and focus on feeling your baby move.   If your baby is still not moving like normal, you should call the office or go to [facility]  Call the office [phone]) or go to [facility] hospital for these signs of pre-eclampsia: Severe headache that does not  go away with Tylenol  Visual changes- seeing spots, double, blurred vision Pain under your right breast or upper abdomen that does not go away with Tums or heartburn medicine Nausea and/or vomiting Severe swelling in your hands, feet, and face   Tdap Vaccine It is recommended that you get the Tdap vaccine during the third trimester of EACH pregnancy to help protect your baby from getting pertussis (whooping cough) 27-36 weeks is the BEST time to do this so that you can pass the protection on to your baby. During pregnancy is better than after pregnancy, but if you are unable to get it during pregnancy it will be offered at the hospital.  You can get this vaccine with us , at the health department, your family doctor, or some local pharmacies Everyone who will be around your baby should also be up-to-date on their vaccines before the baby comes. Adults (who are not pregnant) only need 1 dose of Tdap during adulthood.   [facility]ians/Family Doctors [facility]): [address] Luba BROCKS, [phone]           [facility]: [address], [phone]                [facility]): [address], [phone] (call to ask if accepting patients) [facility] Department: [address], [place], [phone]    [facility] Pediatricians/Family [facility] (  [facility]): [address], [phone] [facility]: [address] [place], [phone] [facility]: [address], [phone]  [facility] Doctors  [facility] [phone] [facility]: [address] [phone]   [facility] Doctors [facility]: [address], [phone]  [facility] Doctors  [facility]: [phone], [phone]  Home Blood Pressure Monitoring for Patients   Your  provider has recommended that you check your blood pressure (BP) at least once a week at home. If you do not have a blood pressure cuff at home, one will be provided for you. Contact your provider if you have not received your monitor within 1 week.   Helpful Tips for Accurate Home Blood Pressure Checks  Don't smoke, exercise, or drink caffeine  30 minutes before checking your BP Use the restroom before checking your BP (a full bladder can raise your pressure) Relax in a comfortable upright chair Feet on the ground Left arm resting comfortably on a flat surface at the level of your heart Legs uncrossed Back supported Sit quietly and don't talk Place the cuff on your bare arm Adjust snuggly, so that only two fingertips can fit between your skin and the top of the cuff Check 2 readings separated by at least one minute Keep a log of your BP readings For a visual, please reference this diagram: http://ccnc.care/bpdiagram  Provider Name: [facility]     Phone: [phone]  Zone 1: ALL CLEAR  Continue to monitor your symptoms:  BP reading is less than 140 (top number) or less than 90 (bottom number)  No right upper stomach pain No headaches or seeing spots No feeling nauseated or throwing up No swelling in face and hands  Zone 2: CAUTION Call your doctor's office for any of the following:  BP reading is greater than 140 (top number) or greater than 90 (bottom number)  Stomach pain under your ribs in the middle or right side Headaches or seeing spots Feeling nauseated or throwing up Swelling in face and hands  Zone 3: EMERGENCY  Seek immediate medical care if you have any of the following:  BP reading is greater than160 (top number) or greater than 110 (bottom number) Severe headaches not improving with Tylenol  Serious difficulty catching your breath Any worsening symptoms from Zone 2   Third Trimester of Pregnancy The third trimester is from week 29 through week 42, months  7 through 9. The third trimester is a time when the fetus is growing rapidly. At the end of the ninth month, the fetus is about 20 inches in length and weighs 6-10 pounds.  BODY CHANGES Your body goes through many changes during pregnancy. The changes vary from woman to woman.  Your weight will continue to increase. You can expect to gain 25-35 pounds (11-16 kg) by the end of the pregnancy. You may begin to get stretch marks on your hips, abdomen, and breasts. You may urinate more often because the fetus is moving lower into your pelvis and pressing on your bladder. You may develop or continue to have heartburn as a result of your pregnancy. You may develop constipation because certain hormones are causing the muscles that push waste through your intestines to slow down. You may develop hemorrhoids or swollen, bulging veins (varicose veins). You may have pelvic pain because of the weight gain and pregnancy hormones relaxing your joints between the bones in  your pelvis. Backaches may result from overexertion of the muscles supporting your posture. You may have changes in your hair. These can include thickening of your hair, rapid growth, and changes in texture. Some women also have hair loss during or after pregnancy, or hair that feels dry or thin. Your hair will most likely return to normal after your baby is born. Your breasts will continue to grow and be tender. A yellow discharge may leak from your breasts called colostrum. Your belly button may stick out. You may feel short of breath because of your expanding uterus. You may notice the fetus dropping, or moving lower in your abdomen. You may have a bloody mucus discharge. This usually occurs a few days to a week before labor begins. Your cervix becomes thin and soft (effaced) near your due date. WHAT TO EXPECT AT YOUR PRENATAL EXAMS  You will have prenatal exams every 2 weeks until week 36. Then, you will have weekly prenatal exams. During a  routine prenatal visit: You will be weighed to make sure you and the fetus are growing normally. Your blood pressure is taken. Your abdomen will be measured to track your baby's growth. The fetal heartbeat will be listened to. Any test results from the previous visit will be discussed. You may have a cervical check near your due date to see if you have effaced. At around 36 weeks, your caregiver will check your cervix. At the same time, your caregiver will also perform a test on the secretions of the vaginal tissue. This test is to determine if a type of bacteria, Group B streptococcus, is present. Your caregiver will explain this further. Your caregiver may ask you: What your birth plan is. How you are feeling. If you are feeling the baby move. If you have had any abnormal symptoms, such as leaking fluid, bleeding, severe headaches, or abdominal cramping. If you have any questions. Other tests or screenings that may be performed during your third trimester include: Blood tests that check for low iron  levels (anemia). Fetal testing to check the health, activity level, and growth of the fetus. Testing is done if you have certain medical conditions or if there are problems during the pregnancy. FALSE LABOR You may feel small, irregular contractions that eventually go away. These are called Braxton Hicks contractions, or false labor. Contractions may last for hours, days, or even weeks before true labor sets in. If contractions come at regular intervals, intensify, or become painful, it is best to be seen by your caregiver.  SIGNS OF LABOR  Menstrual-like cramps. Contractions that are 5 minutes apart or less. Contractions that start on the top of the uterus and spread down to the lower abdomen and back. A sense of increased pelvic pressure or back pain. A watery or bloody mucus discharge that comes from the vagina. If you have any of these signs before the 37th week of pregnancy, call your  caregiver right away. You need to go to the hospital to get checked immediately. HOME CARE INSTRUCTIONS  Avoid all smoking, herbs, alcohol, and unprescribed drugs. These chemicals affect the formation and growth of the baby. Follow your caregiver's instructions regarding medicine use. There are medicines that are either safe or unsafe to take during pregnancy. Exercise only as directed by your caregiver. Experiencing uterine cramps is a good sign to stop exercising. Continue to eat regular, healthy meals. Wear a good support bra for breast tenderness. Do not use hot tubs, steam rooms, or saunas. Wear your  seat belt at all times when driving. Avoid raw meat, uncooked cheese, cat litter boxes, and soil used by cats. These carry germs that can cause birth defects in the baby. Take your prenatal vitamins. Try taking a stool softener (if your caregiver approves) if you develop constipation. Eat more high-fiber foods, such as fresh vegetables or fruit and whole grains. Drink plenty of fluids to keep your urine clear or pale yellow. Take warm sitz baths to soothe any pain or discomfort caused by hemorrhoids. Use hemorrhoid cream if your caregiver approves. If you develop varicose veins, wear support hose. Elevate your feet for 15 minutes, 3-4 times a day. Limit salt in your diet. Avoid heavy lifting, wear low heal shoes, and practice good posture. Rest a lot with your legs elevated if you have leg cramps or low back pain. Visit your dentist if you have not gone during your pregnancy. Use a soft toothbrush to brush your teeth and be gentle when you floss. A sexual relationship may be continued unless your caregiver directs you otherwise. Do not travel far distances unless it is absolutely necessary and only with the approval of your caregiver. Take prenatal classes to understand, practice, and ask questions about the labor and delivery. Make a trial run to the hospital. Pack your hospital bag. Prepare  the baby's nursery. Continue to go to all your prenatal visits as directed by your caregiver. SEEK MEDICAL CARE IF: You are unsure if you are in labor or if your water  has broken. You have dizziness. You have mild pelvic cramps, pelvic pressure, or nagging pain in your abdominal area. You have persistent nausea, vomiting, or diarrhea. You have a bad smelling vaginal discharge. You have pain with urination. SEEK IMMEDIATE MEDICAL CARE IF:  You have a fever. You are leaking fluid from your vagina. You have spotting or bleeding from your vagina. You have severe abdominal cramping or pain. You have rapid weight loss or gain. You have shortness of breath with chest pain. You notice sudden or extreme swelling of your face, hands, ankles, feet, or legs. You have not felt your baby move in over an hour. You have severe headaches that do not go away with medicine. You have vision changes. Document Released: 11/03/2001 Document Revised: 11/14/2013 Document Reviewed: 01/10/2013 [facility] Patient Information 2015 [place], [place]. This information is not intended to replace advice given to you by your health care provider. Make sure you discuss any questions you have with your health care provider.

## 2024-11-07 NOTE — Progress Notes (Signed)
 US : GA = 28+1 weeks Single active female fetus, Erin Krueger Breech, FHR = 170 bpm, AFI = 15.5 cm, MVP = 4.3 cm, anterior pl, gr1, EFW 1320g, 70%,  CL = 4.8 cm, closed

## 2024-11-07 NOTE — Progress Notes (Signed)
 HIGH-RISK PREGNANCY VISIT Patient name: Erin Krueger MRN [id number]  Date of birth: 1996-04-24 Chief Complaint:   Routine Prenatal Visit (Urine cx) and Pregnancy Ultrasound  History of Present Illness:   Erin Krueger is a 28 y.o. [id number] female at [redacted]w[redacted]d with an Estimated Date of Delivery: 01/29/25 being seen today for ongoing management of a high-risk pregnancy complicated by chronic hypertension currently on no meds, h/o 40w IUFD, seizures.    Today she reports took all antibiotics for ASB, urine dark and strong odor again. Contractions: Not present.  .  Movement: Present. denies leaking of fluid.      07/20/2024    3:43 PM 06/17/2022   10:05 AM 03/26/2022    2:13 PM 03/06/2022    9:48 AM 01/22/2022    4:50 PM  Depression screen PHQ 2/9  Decreased Interest 0 0   0  Down, Depressed, Hopeless 0 0   0  PHQ - 2 Score 0 0   0  Altered sleeping 0 3   0  Tired, decreased energy 0 3   0  Change in appetite 0 0   0  Feeling bad or failure about yourself  0 0   0  Trouble concentrating 0 3   0  Moving slowly or fidgety/restless 0 0   0  Suicidal thoughts 0 0   0  PHQ-9 Score 0  9    0   Difficult doing work/chores          Information is confidential and restricted. Go to Review Flowsheets to unlock data.   Data saved with a previous flowsheet row definition        07/20/2024    3:43 PM 06/17/2022   10:07 AM 03/26/2022    2:15 PM 03/06/2022    9:24 AM  GAD 7 : Generalized Anxiety Score  Nervous, Anxious, on Edge 0 0    Control/stop worrying 0 0    Worry too much - different things 0 0    Trouble relaxing 0 1    Restless 0 3    Easily annoyed or irritable 0 0    Afraid - awful might happen 0 0    Total GAD 7 Score 0 4    Anxiety Difficulty         Information is confidential and restricted. Go to Review Flowsheets to unlock data.     Review of Systems:   Pertinent items are noted in HPI Denies abnormal vaginal discharge w/ itching/odor/irritation, headaches, visual changes,  shortness of breath, chest pain, abdominal pain, severe nausea/vomiting, or problems with urination or bowel movements unless otherwise stated above. Pertinent History Reviewed:  Reviewed past medical,surgical, social, obstetrical and family history.  Reviewed problem list, medications and allergies. Physical Assessment:   Vitals:   11/07/24 1551  BP: (!) 126/58  Pulse: (!) 105  Weight: 251 lb 6.4 oz (114 kg)  Body mass index is 40.58 kg/m.           Physical Examination:   General appearance: alert, well appearing, and in no distress  Mental status: alert, oriented to person, place, and time  Skin: warm & dry   Extremities: Edema: Trace    Cardiovascular: normal heart rate noted  Respiratory: normal respiratory effort, no distress  Abdomen: gravid, soft, non-tender  Pelvic: Cervical exam deferred         Fetal Status:     Movement: Present    Fetal Surveillance Testing today: US : GA = 28+1  weeks Single active female fetus, Dempsey Breech, FHR = 170 bpm, AFI = 15.5 cm, MVP = 4.3 cm, anterior pl, gr1, EFW 1320g, 70%,  CL = 4.8 cm, closed  Chaperone: N/A  Results for orders placed or performed in visit on 11/07/24 (from the past 24 hours)  POC Urinalysis Dipstick OB   Collection Time: 11/07/24  4:08 PM  Result Value Ref Range   Color, UA     Clarity, UA     Glucose, UA Negative Negative   Bilirubin, UA     Ketones, UA trace    Spec Grav, UA     Blood, UA trace    pH, UA     POC,PROTEIN,UA Small (1+) Negative, Trace, Small (1+), Moderate (2+), Large (3+), 4+   Urobilinogen, UA     Nitrite, UA negative    Leukocytes, UA Small (1+) (A) Negative   Appearance     Odor      Assessment & Plan:  High-risk pregnancy: [id number] at 101w1d with an Estimated Date of Delivery: 01/29/25   1) CHTN w/ h/o pre-e, no meds, continue ASA, bp stable  2) H/O 40w IUFD  3) Seizures> on lamictal /CBD per neuro  4) Prev high incision C/S> RCS ? 37w  5) ASB x 2> urine cx poc today (having  darker/strong odor to urine)  Meds: No orders of the defined types were placed in this encounter.   Labs/procedures today: flu shot, Tdap, and pn2 was not scheduled  Treatment Plan:  EFW q4w @ 24w     2x/wk nst or weekly bpp @ 32wks        Reviewed: Preterm labor symptoms and general obstetric precautions including but not limited to vaginal bleeding, contractions, leaking of fluid and fetal movement were reviewed in detail with the patient.  All questions were answered. Does have home bp cuff. Office bp cuff given: not applicable. Check bp weekly, let us  know if consistently >140 and/or >90.  Follow-up: Return for asap pn2 (no visit); 2wks HROB MD; then 4wks EFW/bpp and HROB MD (start weekly bpp or 2x/wk NST@ 32).   Future Appointments  Date Time Provider Department Center  11/08/2024  8:30 AM [facility]  11/22/2024  2:10 PM Ozan, Jennifer, DO [facility]  12/05/2024  2:15 PM [facility] 2 [facility] None  12/05/2024  3:10 PM Ozan, Jennifer, DO [facility]    Orders Placed This Encounter  Procedures   Urine Culture   Tdap vaccine greater than or equal to 7yo IM   Flu vaccine trivalent PF, 6mos and older(Flulaval,Afluria,Fluarix,Fluzone)   POC Urinalysis Dipstick OB   Suzen JONELLE Fetters CNM, [facility] 11/07/2024 5:06 PM

## 2024-11-08 ENCOUNTER — Other Ambulatory Visit

## 2024-11-10 LAB — URINE CULTURE

## 2024-11-13 ENCOUNTER — Ambulatory Visit: Payer: Self-pay | Admitting: Women's Health

## 2024-11-13 DIAGNOSIS — O99891 Other specified diseases and conditions complicating pregnancy: Secondary | ICD-10-CM

## 2024-11-13 MED ORDER — NITROFURANTOIN MONOHYD MACRO 100 MG PO CAPS: 100.0000 mg | ORAL_CAPSULE | Freq: Every day | ORAL | 3 refills | Status: DC

## 2024-11-13 MED ORDER — SULFAMETHOXAZOLE-TRIMETHOPRIM 800-160 MG PO TABS: 1.0000 | ORAL_TABLET | Freq: Two times a day (BID) | ORAL | 0 refills | Status: DC

## 2024-11-14 ENCOUNTER — Other Ambulatory Visit

## 2024-11-17 ENCOUNTER — Other Ambulatory Visit

## 2024-11-20 ENCOUNTER — Encounter: Payer: Self-pay | Admitting: Women's Health

## 2024-11-22 ENCOUNTER — Ambulatory Visit: Admitting: Obstetrics & Gynecology

## 2024-11-22 VITALS — BP 136/74 | HR 74 | Wt 249.8 lb

## 2024-11-22 DIAGNOSIS — O99213 Obesity complicating pregnancy, third trimester: Secondary | ICD-10-CM | POA: Diagnosis not present

## 2024-11-22 DIAGNOSIS — O2343 Unspecified infection of urinary tract in pregnancy, third trimester: Secondary | ICD-10-CM | POA: Diagnosis not present

## 2024-11-22 DIAGNOSIS — O0990 Supervision of high risk pregnancy, unspecified, unspecified trimester: Secondary | ICD-10-CM

## 2024-11-22 DIAGNOSIS — O34218 Maternal care for other type scar from previous cesarean delivery: Secondary | ICD-10-CM | POA: Diagnosis not present

## 2024-11-22 DIAGNOSIS — J45909 Unspecified asthma, uncomplicated: Secondary | ICD-10-CM

## 2024-11-22 DIAGNOSIS — Z8759 Personal history of other complications of pregnancy, childbirth and the puerperium: Secondary | ICD-10-CM

## 2024-11-22 DIAGNOSIS — Z98891 History of uterine scar from previous surgery: Secondary | ICD-10-CM

## 2024-11-22 DIAGNOSIS — O09893 Supervision of other high risk pregnancies, third trimester: Secondary | ICD-10-CM | POA: Diagnosis not present

## 2024-11-22 DIAGNOSIS — O99513 Diseases of the respiratory system complicating pregnancy, third trimester: Secondary | ICD-10-CM

## 2024-11-22 DIAGNOSIS — Z3A30 30 weeks gestation of pregnancy: Secondary | ICD-10-CM

## 2024-11-22 DIAGNOSIS — E669 Obesity, unspecified: Secondary | ICD-10-CM | POA: Diagnosis not present

## 2024-11-22 DIAGNOSIS — O10913 Unspecified pre-existing hypertension complicating pregnancy, third trimester: Secondary | ICD-10-CM | POA: Diagnosis not present

## 2024-11-22 DIAGNOSIS — Z87898 Personal history of other specified conditions: Secondary | ICD-10-CM

## 2024-11-22 DIAGNOSIS — O99891 Other specified diseases and conditions complicating pregnancy: Secondary | ICD-10-CM

## 2024-11-22 NOTE — Progress Notes (Signed)
 "  HIGH-RISK PREGNANCY VISIT Patient name: Erin Krueger MRN [id number]  Date of birth: 1996-04-09 Chief Complaint:   Routine Prenatal Visit  History of Present Illness:   Erin Krueger is a 28 y.o. [id number] female at [redacted]w[redacted]d with an Estimated Date of Delivery: 01/29/25 being seen today for ongoing management of a high-risk pregnancy complicated by:  -Chronic HTN no meds -h/o IUFD -prior mid-transverse incision- plan for early repeat -Asthma -Obesity -h/o seizure d/o on Keppra /Lamictal  seen by Neuro - Asymptomatic bacteriuria x3, on macrobid  daily  Patient still struggling with movement due to hip and back pain.  Denies regular contractions.  Notes baseline headaches, but unchanged from previous.  Denies blurry vision or RUQ pain  Contractions: Not present. Vag. Bleeding: None.  Movement: Present. denies leaking of fluid.      07/20/2024    3:43 PM 06/17/2022   10:05 AM 03/26/2022    2:13 PM 03/06/2022    9:48 AM 01/22/2022    4:50 PM  Depression screen PHQ 2/9  Decreased Interest 0 0   0  Down, Depressed, Hopeless 0 0   0  PHQ - 2 Score 0 0   0  Altered sleeping 0 3   0  Tired, decreased energy 0 3   0  Change in appetite 0 0   0  Feeling bad or failure about yourself  0 0   0  Trouble concentrating 0 3   0  Moving slowly or fidgety/restless 0 0   0  Suicidal thoughts 0 0   0  PHQ-9 Score 0  9    0   Difficult doing work/chores          Information is confidential and restricted. Go to Review Flowsheets to unlock data.   Data saved with a previous flowsheet row definition     Current Outpatient Medications  Medication Instructions   acetaminophen  (TYLENOL ) 1,000 mg, Oral, Every 6 hours PRN   ALBUTEROL  SULFATE IN Inhale into the lungs.   aspirin  EC 81 mg, Oral, Daily   Blood Pressure Monitor MISC For regular home bp monitoring during pregnancy   cyclobenzaprine  (FLEXERIL ) 10 mg, Oral, 3 times daily PRN   EPINEPHrine  (EPI-PEN) 0.3 mg, Intramuscular, As needed    lamoTRIgine  (LAMICTAL ) 100 mg, Oral, 2 times daily   levETIRAcetam  (KEPPRA ) 1,500 mg, Oral, 2 times daily   nitrofurantoin  (macrocrystal-monohydrate) (MACROBID ) 100 mg, Oral, Daily at bedtime, After you are finished w/ bactrim  rx   ondansetron  (ZOFRAN -ODT) 4 mg, Oral, Every 6 hours PRN   Prenatal Vit-Fe Fumarate-FA (PRENATAL VITAMIN PO) 1 capsule, Daily   sulfamethoxazole -trimethoprim  (BACTRIM  DS) 800-160 MG tablet 1 tablet, Oral, 2 times daily   venlafaxine XR (EFFEXOR-XR) 75 mg, Daily     Review of Systems:   Pertinent items are noted in HPI  Pertinent History Reviewed:  Reviewed past medical,surgical, social, obstetrical and family history.  Reviewed problem list, medications and allergies. Physical Assessment:   Vitals:   11/22/24 1418  BP: 136/74  Pulse: 74  Weight: 249 lb 12.8 oz (113.3 kg)  Body mass index is 40.32 kg/m.           Physical Examination:   General appearance: alert, well appearing, and in no distress  Mental status: normal mood, behavior, speech, dress, motor activity, and thought processes  Skin: warm & dry   Extremities:   minimal edema   Cardiovascular: normal heart rate noted  Respiratory: normal respiratory effort, no distress  Abdomen: gravid, soft, non-tender  Pelvic: Cervical exam deferred         Fetal Status: Fetal Heart Rate (bpm): 145 Fundal Height: 30 cm Movement: Present    Fetal Surveillance Testing today: doppler   Chaperone: N/A    No results found for this or any previous visit (from the past 24 hours).   Assessment & Plan:  High-risk pregnancy: [id number] at [redacted]w[redacted]d with an Estimated Date of Delivery: 01/29/25   -Chronic HTN no meds -h/o IUFD -prior mid-transverse incision- plan for early repeat []  will schedule @ 37wks Discussed contraceptive options as pt does not desire another pregnancy -Risks of procedure discussed with patient including but not limited to: risk of regret, permanence of method, bleeding, infection, and  potential injury to surrounding organs- similar risk as C_section.  Adds a few minutes to the procedure and completed during C-section Discussed that especially in setting of salpingectomy, risk of ectopic pregnancy low, less than 1%.   Pt desires this option, consent obtained today -Asthma -Obesity -h/o seizure d/o on Keppra /Lamictal  seen by Neuro - Asymptomatic bacteriuria x3, on macrobid  daily - pt did not leave sample, []  plan to collect at next visit  Meds: No orders of the defined types were placed in this encounter.   Labs/procedures today: doppler  Treatment Plan:  routine OB care and as outlined above  Reviewed: Preterm labor symptoms and general obstetric precautions including but not limited to vaginal bleeding, contractions, leaking of fluid and fetal movement were reviewed in detail with the patient.  All questions were answered.   Follow-up: Return in about 2 weeks (around 12/06/2024) for HROB visit and growth ever 4wks.   Future Appointments  Date Time Provider Department Center  11/24/2024  8:50 AM [facility]  12/05/2024  2:15 PM [facility] 2 [facility] None  12/05/2024  3:10 PM Marilynn Nest, DO [facility]  12/12/2024  2:15 PM [facility] US  [facility] None  12/12/2024  3:10 PM Kizzie Suzen SAUNDERS, CNM [facility]  12/19/2024  2:15 PM [facility] US  [facility] None  12/19/2024  3:10 PM Max Romano, DO [facility]  12/26/2024  2:15 PM [facility] IM[facility] 2 [facility] None  12/26/2024  3:10 PM Kizzie Suzen SAUNDERS, CNM [facility]    No orders of the defined types were placed in this encounter.   Amnah Breuer, DO Attending Obstetrician & Gynecologist, [facility] for [facility], [facility]    "

## 2024-11-24 ENCOUNTER — Other Ambulatory Visit

## 2024-11-25 LAB — CBC
Hematocrit: 35.4 % (ref 34.0–46.6)
Hemoglobin: 11.6 g/dL (ref 11.1–15.9)
MCH: 29.2 pg (ref 26.6–33.0)
MCHC: 32.8 g/dL (ref 31.5–35.7)
MCV: 89 fL (ref 79–97)
Platelets: 235 x10E3/uL (ref 150–450)
RBC: 3.97 x10E6/uL (ref 3.77–5.28)
RDW: 12.7 % (ref 11.7–15.4)
WBC: 11.3 x10E3/uL — ABNORMAL HIGH (ref 3.4–10.8)

## 2024-11-25 LAB — SYPHILIS: RPR W/REFLEX TO RPR TITER AND TREPONEMAL ANTIBODIES, TRADITIONAL SCREENING AND DIAGNOSIS ALGORITHM: RPR Ser Ql: NONREACTIVE

## 2024-11-25 LAB — ANTIBODY SCREEN: Antibody Screen: NEGATIVE

## 2024-11-25 LAB — GLUCOSE TOLERANCE, 2 HOURS W/ 1HR
Glucose, 1 hour: 208 mg/dL — ABNORMAL HIGH (ref 70–179)
Glucose, 2 hour: 136 mg/dL (ref 70–152)
Glucose, Fasting: 99 mg/dL — ABNORMAL HIGH (ref 70–91)

## 2024-11-25 LAB — HIV ANTIBODY (ROUTINE TESTING W REFLEX): HIV Screen 4th Generation wRfx: NONREACTIVE

## 2024-11-27 ENCOUNTER — Other Ambulatory Visit: Payer: Self-pay | Admitting: *Deleted

## 2024-11-27 ENCOUNTER — Encounter: Payer: Self-pay | Admitting: Women's Health

## 2024-11-27 DIAGNOSIS — O24419 Gestational diabetes mellitus in pregnancy, unspecified control: Secondary | ICD-10-CM | POA: Insufficient documentation

## 2024-11-27 DIAGNOSIS — Z3A31 31 weeks gestation of pregnancy: Secondary | ICD-10-CM

## 2024-11-27 DIAGNOSIS — O24410 Gestational diabetes mellitus in pregnancy, diet controlled: Secondary | ICD-10-CM

## 2024-11-27 DIAGNOSIS — O0990 Supervision of high risk pregnancy, unspecified, unspecified trimester: Secondary | ICD-10-CM

## 2024-11-27 MED ORDER — ACCU-CHEK GUIDE TEST VI STRP: ORAL_STRIP | 12 refills | Status: DC

## 2024-11-27 MED ORDER — ACCU-CHEK GUIDE ME W/DEVICE KIT: 1.0000 | PACK | 0 refills | Status: DC

## 2024-11-27 MED ORDER — ACCU-CHEK SOFTCLIX LANCETS MISC: 12 refills | Status: DC

## 2024-12-04 ENCOUNTER — Other Ambulatory Visit: Payer: Self-pay | Admitting: Women's Health

## 2024-12-04 ENCOUNTER — Telehealth: Payer: Self-pay

## 2024-12-04 DIAGNOSIS — O34219 Maternal care for unspecified type scar from previous cesarean delivery: Secondary | ICD-10-CM

## 2024-12-04 DIAGNOSIS — E669 Obesity, unspecified: Secondary | ICD-10-CM

## 2024-12-04 DIAGNOSIS — O09299 Supervision of pregnancy with other poor reproductive or obstetric history, unspecified trimester: Secondary | ICD-10-CM

## 2024-12-04 DIAGNOSIS — O10919 Unspecified pre-existing hypertension complicating pregnancy, unspecified trimester: Secondary | ICD-10-CM

## 2024-12-04 DIAGNOSIS — Z124 Encounter for screening for malignant neoplasm of cervix: Secondary | ICD-10-CM

## 2024-12-04 DIAGNOSIS — Z3A32 32 weeks gestation of pregnancy: Secondary | ICD-10-CM

## 2024-12-04 DIAGNOSIS — Z8759 Personal history of other complications of pregnancy, childbirth and the puerperium: Secondary | ICD-10-CM

## 2024-12-04 DIAGNOSIS — O0990 Supervision of high risk pregnancy, unspecified, unspecified trimester: Secondary | ICD-10-CM

## 2024-12-04 DIAGNOSIS — A749 Chlamydial infection, unspecified: Secondary | ICD-10-CM

## 2024-12-04 DIAGNOSIS — R8271 Bacteriuria: Secondary | ICD-10-CM

## 2024-12-04 DIAGNOSIS — F418 Other specified anxiety disorders: Secondary | ICD-10-CM

## 2024-12-04 DIAGNOSIS — Z8279 Family history of other congenital malformations, deformations and chromosomal abnormalities: Secondary | ICD-10-CM

## 2024-12-04 NOTE — Telephone Encounter (Signed)
 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 Taking muscle relaxer 3x a day  and doing physical therapy, heating pad nothing helping. Wants to know what else she can do. RN discussed with Dr Jayne on options. Per MD there is nothing else we can do medically, he did suggest talking to physical therapy about getting a walker to help with mobility.

## 2024-12-05 ENCOUNTER — Ambulatory Visit (INDEPENDENT_AMBULATORY_CARE_PROVIDER_SITE_OTHER): Admitting: Radiology

## 2024-12-05 ENCOUNTER — Other Ambulatory Visit: Payer: Self-pay | Admitting: Women's Health

## 2024-12-05 ENCOUNTER — Ambulatory Visit: Admitting: Obstetrics & Gynecology

## 2024-12-05 ENCOUNTER — Encounter: Payer: Self-pay | Admitting: Obstetrics & Gynecology

## 2024-12-05 VITALS — BP 112/85 | HR 98 | Wt 252.0 lb

## 2024-12-05 DIAGNOSIS — Z3A33 33 weeks gestation of pregnancy: Secondary | ICD-10-CM

## 2024-12-05 DIAGNOSIS — Z79899 Other long term (current) drug therapy: Secondary | ICD-10-CM

## 2024-12-05 DIAGNOSIS — O99213 Obesity complicating pregnancy, third trimester: Secondary | ICD-10-CM

## 2024-12-05 DIAGNOSIS — E669 Obesity, unspecified: Secondary | ICD-10-CM | POA: Diagnosis not present

## 2024-12-05 DIAGNOSIS — Z3A32 32 weeks gestation of pregnancy: Secondary | ICD-10-CM

## 2024-12-05 DIAGNOSIS — Z98891 History of uterine scar from previous surgery: Secondary | ICD-10-CM

## 2024-12-05 DIAGNOSIS — O34219 Maternal care for unspecified type scar from previous cesarean delivery: Secondary | ICD-10-CM

## 2024-12-05 DIAGNOSIS — O10919 Unspecified pre-existing hypertension complicating pregnancy, unspecified trimester: Secondary | ICD-10-CM

## 2024-12-05 DIAGNOSIS — O10913 Unspecified pre-existing hypertension complicating pregnancy, third trimester: Secondary | ICD-10-CM

## 2024-12-05 DIAGNOSIS — O09893 Supervision of other high risk pregnancies, third trimester: Secondary | ICD-10-CM

## 2024-12-05 DIAGNOSIS — O24419 Gestational diabetes mellitus in pregnancy, unspecified control: Secondary | ICD-10-CM | POA: Diagnosis not present

## 2024-12-05 DIAGNOSIS — O09213 Supervision of pregnancy with history of pre-term labor, third trimester: Secondary | ICD-10-CM | POA: Diagnosis not present

## 2024-12-05 DIAGNOSIS — Z8669 Personal history of other diseases of the nervous system and sense organs: Secondary | ICD-10-CM | POA: Diagnosis not present

## 2024-12-05 DIAGNOSIS — O2343 Unspecified infection of urinary tract in pregnancy, third trimester: Secondary | ICD-10-CM

## 2024-12-05 DIAGNOSIS — O24410 Gestational diabetes mellitus in pregnancy, diet controlled: Secondary | ICD-10-CM | POA: Diagnosis not present

## 2024-12-05 DIAGNOSIS — O09299 Supervision of pregnancy with other poor reproductive or obstetric history, unspecified trimester: Secondary | ICD-10-CM

## 2024-12-05 DIAGNOSIS — O0990 Supervision of high risk pregnancy, unspecified, unspecified trimester: Secondary | ICD-10-CM

## 2024-12-05 DIAGNOSIS — Z8759 Personal history of other complications of pregnancy, childbirth and the puerperium: Secondary | ICD-10-CM

## 2024-12-05 DIAGNOSIS — Z87440 Personal history of urinary (tract) infections: Secondary | ICD-10-CM

## 2024-12-05 LAB — POCT URINALYSIS DIPSTICK OB
Glucose, UA: NEGATIVE
Ketones, UA: NEGATIVE
Nitrite, UA: NEGATIVE
POC,PROTEIN,UA: NEGATIVE

## 2024-12-05 NOTE — Progress Notes (Signed)
 "  HIGH-RISK PREGNANCY VISIT Patient name: Erin Krueger MRN [id number]  Date of birth: 1996/01/14 Chief Complaint:   Routine Prenatal Visit  History of Present Illness:   [name] is a 29 y.o. [id number] female at [redacted]w[redacted]d with an Estimated Date of Delivery: 01/29/25 being seen today for ongoing management of a high-risk pregnancy complicated by:  -GDMA1 -sugars reviewed all postprandial elevated  -PRior C-section -Chronic HTN -Obesity -h/o seizure d/o -Recurrent UTIs Today she reports no complaints.   Contractions: Not present. Vag. Bleeding: None.  Movement: Present. denies leaking of fluid.      07/20/2024    3:43 PM 06/17/2022   10:05 AM 03/26/2022    2:13 PM 03/06/2022    9:48 AM 01/22/2022    4:50 PM  Depression screen PHQ 2/9  Decreased Interest 0 0   0  Down, Depressed, Hopeless 0 0   0  PHQ - 2 Score 0 0   0  Altered sleeping 0 3   0  Tired, decreased energy 0 3   0  Change in appetite 0 0   0  Feeling bad or failure about yourself  0 0   0  Trouble concentrating 0 3   0  Moving slowly or fidgety/restless 0 0   0  Suicidal thoughts 0 0   0  PHQ-9 Score 0  9    0   Difficult doing work/chores          Information is confidential and restricted. Go to Review Flowsheets to unlock data.   Data saved with a previous flowsheet row definition     Current Outpatient Medications  Medication Instructions   Accu-Chek Softclix Lancets lancets Check blood sugar four times daily.   acetaminophen  (TYLENOL ) 1,000 mg, Oral, Every 6 hours PRN   ALBUTEROL  SULFATE IN Inhale into the lungs.   aspirin  EC 81 mg, Oral, Daily   Blood Glucose Monitoring Suppl (ACCU-CHEK GUIDE ME) w/Device KIT 1 kit, Does not apply, As directed   Blood Pressure Monitor MISC For regular home bp monitoring during pregnancy   cyclobenzaprine  (FLEXERIL ) 10 mg, Oral, 3 times daily PRN   EPINEPHrine  (EPI-PEN) 0.3 mg, Intramuscular, As needed   glucose blood (ACCU-CHEK GUIDE TEST) test strip Check Blood  sugar four times a day.   lamoTRIgine  (LAMICTAL ) 100 mg, Oral, 2 times daily   levETIRAcetam  (KEPPRA ) 1,500 mg, Oral, 2 times daily   nitrofurantoin  (macrocrystal-monohydrate) (MACROBID ) 100 mg, Oral, Daily at bedtime, After you are finished w/ bactrim  rx   ondansetron  (ZOFRAN -ODT) 4 mg, Oral, Every 6 hours PRN   Prenatal Vit-Fe Fumarate-FA (PRENATAL VITAMIN PO) 1 capsule, Daily   sulfamethoxazole -trimethoprim  (BACTRIM  DS) 800-160 MG tablet 1 tablet, Oral, 2 times daily   venlafaxine XR (EFFEXOR-XR) 75 mg, Daily     Review of Systems:   Pertinent items are noted in HPI Denies abnormal vaginal discharge w/ itching/odor/irritation, headaches, visual changes, shortness of breath, chest pain, abdominal pain, severe nausea/vomiting, or problems with urination or bowel movements unless otherwise stated above. Pertinent History Reviewed:  Reviewed past medical,surgical, social, obstetrical and family history.  Reviewed problem list, medications and allergies. Physical Assessment:   Vitals:   12/05/24 1507  BP: 112/85  Pulse: 98  Weight: 252 lb (114.3 kg)  Body mass index is 40.67 kg/m.           Physical Examination:   General appearance: alert, well appearing, and in no distress  Mental status: normal mood, behavior, speech, dress, motor activity,  and thought processes  Skin: warm & dry   Extremities:   1+ edema   Cardiovascular: normal heart rate noted  Respiratory: normal respiratory effort, no distress  Abdomen: gravid, soft, non-tender  Pelvic: Cervical exam deferred         Fetal Status:     Movement: Present    Fetal Surveillance Testing today:  cephalic, FHR=133bpm, anterior pl, gr1, AFI = 15.9cm, MVP = 4.6cm, BPP = 8/8, RI: 0.57, 0.61, 0.62   46%,  EFW 2122g, 70%,  cx appears closed           Results for orders placed or performed in visit on 12/05/24 (from the past 24 hours)  POC Urinalysis Dipstick OB     Status: Abnormal   Collection Time: 12/05/24  3:34 PM   Result Value Ref Range   Color, UA     Clarity, UA     Glucose, UA Negative Negative   Bilirubin, UA     Ketones, UA neg    Spec Grav, UA     Blood, UA trace    pH, UA     POC,PROTEIN,UA Negative Negative, Trace, Small (1+), Moderate (2+), Large (3+), 4+   Urobilinogen, UA     Nitrite, UA neg    Leukocytes, UA Moderate (2+) (A) Negative   Appearance     Odor       Chaperone: N/A    Results for orders placed or performed in visit on 12/05/24 (from the past 24 hours)  POC Urinalysis Dipstick OB   Collection Time: 12/05/24  3:34 PM  Result Value Ref Range   Color, UA     Clarity, UA     Glucose, UA Negative Negative   Bilirubin, UA     Ketones, UA neg    Spec Grav, UA     Blood, UA trace    pH, UA     POC,PROTEIN,UA Negative Negative, Trace, Small (1+), Moderate (2+), Large (3+), 4+   Urobilinogen, UA     Nitrite, UA neg    Leukocytes, UA Moderate (2+) (A) Negative   Appearance     Odor       Assessment & Plan:  High-risk pregnancy: [id number] at [redacted]w[redacted]d with an Estimated Date of Delivery: 01/29/25   1. Gestational diabetes mellitus, class A2 -plan to start metformin twice daily -continue monitoring -serial growth screen- AGA based on today's US   2. H/o seizure d/o -stable with current medication  3. History of cesarean delivery Plan for repeat with BTS  4. Chronic hypertension affecting pregnancy No meds currently UA negative for protein  6. Recurrent UTI -currently taking macrobid  daily -plan to recheck culture today   Meds: No orders of the defined types were placed in this encounter.   Labs/procedures today: UA and culture  Treatment Plan:  routine OB care and as outlined above  Reviewed: Preterm labor symptoms and general obstetric precautions including but not limited to vaginal bleeding, contractions, leaking of fluid and fetal movement were reviewed in detail with the patient.  All questions were answered. Pt has home bp cuff. Check bp weekly,  let us  know if >140/90.   Follow-up: Return for continue weekly BPP/HROB.   Future Appointments  Date Time Provider Department Center  12/12/2024  2:15 PM [facility] - [facility] US  [facility] None  12/12/2024  3:10 PM Kizzie Suzen SAUNDERS, CNM [facility]  12/19/2024  2:15 PM [facility] - [facility] US  [facility] None  12/19/2024  3:10 PM Vaughan Garfinkle, DO [facility]  12/26/2024  2:15 PM [facility] IMG 2 [facility] None  12/26/2024  3:10 PM Kizzie Suzen SAUNDERS, CNM [facility]  01/02/2025  3:00 PM [facility] US  [facility] None  01/02/2025  3:50 PM Jayne Vonn DEL, MD [facility]  01/09/2025  3:00 PM [facility] US  [facility] None  01/09/2025  3:50 PM Thena Devora, DO [facility]  01/16/2025  3:00 PM [facility] US  [facility] None  01/16/2025  3:50 PM Kizzie Suzen SAUNDERS, CNM [facility]  01/23/2025  3:00 PM [facility] IMG 2 [facility] None  01/23/2025  3:50 PM Booker, Suzen SAUNDERS, CNM [facility]    Orders Placed This Encounter  Procedures   Urine Culture   POC Urinalysis Dipstick OB    Delon Prude, DO Attending Obstetrician & Gynecologist, [facility] [employer], [facility]    "

## 2024-12-05 NOTE — Progress Notes (Signed)
 US : GA = 32+1 weeks Single active female fetus, cephalic, FHR=133bpm, anterior pl, gr1, AFI = 15.9cm, MVP = 4.6cm, BPP = 8/8, RI: 0.57, 0.61, 0.62   46%,  EFW 2122g, 70%,  cx appears closed

## 2024-12-07 ENCOUNTER — Other Ambulatory Visit: Payer: Self-pay

## 2024-12-09 LAB — URINE CULTURE

## 2024-12-11 ENCOUNTER — Ambulatory Visit: Payer: Self-pay | Admitting: Women's Health

## 2024-12-11 ENCOUNTER — Other Ambulatory Visit: Payer: Self-pay | Admitting: Obstetrics & Gynecology

## 2024-12-11 DIAGNOSIS — O24419 Gestational diabetes mellitus in pregnancy, unspecified control: Secondary | ICD-10-CM

## 2024-12-11 DIAGNOSIS — O10919 Unspecified pre-existing hypertension complicating pregnancy, unspecified trimester: Secondary | ICD-10-CM

## 2024-12-11 DIAGNOSIS — R8271 Bacteriuria: Secondary | ICD-10-CM

## 2024-12-11 DIAGNOSIS — Z6841 Body Mass Index (BMI) 40.0 and over, adult: Secondary | ICD-10-CM

## 2024-12-11 MED ORDER — SULFAMETHOXAZOLE-TRIMETHOPRIM 800-160 MG PO TABS: 1.0000 | ORAL_TABLET | Freq: Two times a day (BID) | ORAL | 0 refills | Status: DC

## 2024-12-12 ENCOUNTER — Encounter: Payer: Self-pay | Admitting: Women's Health

## 2024-12-12 ENCOUNTER — Other Ambulatory Visit

## 2024-12-12 ENCOUNTER — Other Ambulatory Visit (HOSPITAL_COMMUNITY)
Admission: RE | Admit: 2024-12-12 | Discharge: 2024-12-12 | Disposition: A | Discharge status: Home / Self Care | Source: Ambulatory Visit | Attending: Women's Health | Admitting: Women's Health

## 2024-12-12 ENCOUNTER — Ambulatory Visit (INDEPENDENT_AMBULATORY_CARE_PROVIDER_SITE_OTHER): Admitting: Women's Health

## 2024-12-12 VITALS — BP 118/77 | HR 101 | Wt 254.6 lb

## 2024-12-12 DIAGNOSIS — R569 Unspecified convulsions: Secondary | ICD-10-CM

## 2024-12-12 DIAGNOSIS — Z8759 Personal history of other complications of pregnancy, childbirth and the puerperium: Secondary | ICD-10-CM

## 2024-12-12 DIAGNOSIS — O2333 Infections of other parts of urinary tract in pregnancy, third trimester: Secondary | ICD-10-CM

## 2024-12-12 DIAGNOSIS — O10919 Unspecified pre-existing hypertension complicating pregnancy, unspecified trimester: Secondary | ICD-10-CM

## 2024-12-12 DIAGNOSIS — Z3A33 33 weeks gestation of pregnancy: Secondary | ICD-10-CM | POA: Diagnosis not present

## 2024-12-12 DIAGNOSIS — O24419 Gestational diabetes mellitus in pregnancy, unspecified control: Secondary | ICD-10-CM

## 2024-12-12 DIAGNOSIS — O0993 Supervision of high risk pregnancy, unspecified, third trimester: Secondary | ICD-10-CM | POA: Diagnosis present

## 2024-12-12 DIAGNOSIS — O10913 Unspecified pre-existing hypertension complicating pregnancy, third trimester: Secondary | ICD-10-CM

## 2024-12-12 DIAGNOSIS — O0943 Supervision of pregnancy with grand multiparity, third trimester: Secondary | ICD-10-CM

## 2024-12-12 DIAGNOSIS — O34219 Maternal care for unspecified type scar from previous cesarean delivery: Secondary | ICD-10-CM

## 2024-12-12 DIAGNOSIS — N898 Other specified noninflammatory disorders of vagina: Secondary | ICD-10-CM | POA: Diagnosis not present

## 2024-12-12 DIAGNOSIS — O24415 Gestational diabetes mellitus in pregnancy, controlled by oral hypoglycemic drugs: Secondary | ICD-10-CM | POA: Diagnosis not present

## 2024-12-12 DIAGNOSIS — Z6841 Body Mass Index (BMI) 40.0 and over, adult: Secondary | ICD-10-CM

## 2024-12-12 DIAGNOSIS — O09893 Supervision of other high risk pregnancies, third trimester: Secondary | ICD-10-CM | POA: Diagnosis not present

## 2024-12-12 DIAGNOSIS — O99891 Other specified diseases and conditions complicating pregnancy: Secondary | ICD-10-CM | POA: Diagnosis not present

## 2024-12-12 DIAGNOSIS — O0990 Supervision of high risk pregnancy, unspecified, unspecified trimester: Secondary | ICD-10-CM

## 2024-12-12 MED ORDER — METFORMIN HCL 500 MG PO TABS: ORAL_TABLET | ORAL | 3 refills | Status: DC

## 2024-12-12 MED ORDER — NITROFURANTOIN MONOHYD MACRO 100 MG PO CAPS: 100.0000 mg | ORAL_CAPSULE | Freq: Every day | ORAL | 3 refills | Status: DC

## 2024-12-12 NOTE — Patient Instructions (Signed)
 Erin Krueger, thank you for choosing our office today! We appreciate the opportunity to meet your healthcare needs. You may receive a short survey by mail, e-mail, or through [employer]. If you are happy with your care we would appreciate if you could take just a few minutes to complete the survey questions. We read all of your comments and take your feedback very seriously. Thank you again for choosing our office.  Center for [employer] Team at [facility]  [facility] at [facility] ([address] [place], [place] [address]) Entrance C, located off of [address] Free 24/7 valet parking   CLASSES: Go to [email or web address] to register for classes (childbirth, breastfeeding, waterbirth, infant CPR, daddy bootcamp, etc.)  Call the office [phone]) or go to [facility] if: You begin to have strong, frequent contractions Your water  breaks.  Sometimes it is a big gush of fluid, sometimes it is just a trickle that keeps getting your panties wet or running down your legs You have vaginal bleeding.  It is normal to have a small amount of spotting if your cervix was checked.  You don't feel your baby moving like normal.  If you don't, get you something to eat and drink and lay down and focus on feeling your baby move.   If your baby is still not moving like normal, you should call the office or go to [facility].  Call the office [phone]) or go to [facility] hospital for these signs of pre-eclampsia: Severe headache that does not go away with Tylenol  Visual changes- seeing spots, double, blurred vision Pain under your right breast or upper abdomen that does not go away with Tums or heartburn medicine Nausea and/or vomiting Severe swelling in your hands, feet, and face   Tdap Vaccine It is recommended that you get the Tdap vaccine during the third trimester of EACH pregnancy to help protect your baby from getting pertussis (whooping cough) 27-36 weeks is the BEST time to do  this so that you can pass the protection on to your baby. During pregnancy is better than after pregnancy, but if you are unable to get it during pregnancy it will be offered at the hospital.  You can get this vaccine with us , at the health department, your family doctor, or some local pharmacies Everyone who will be around your baby should also be up-to-date on their vaccines before the baby comes. Adults (who are not pregnant) only need 1 dose of Tdap during adulthood.   [facility] Pediatricians/Family Doctors [facility]): [address] [place], [phone]           [facility]: [address], [phone]                [facility]): [address], [phone] (call to ask if accepting patients) [facility] Department: [address], [place], [phone]    [facility] Pediatricians/Family Doctors [facility]): [phone] [address], [phone] [facility]: [address] [place], [phone] [facility]: [address], [phone]  [facility] Doctors  [facility]): [phone] [facility]: [address], [phone]   [facility] Doctors [facility]: [address] [phone]  [facility] Family Doctors  [facility]: [phone], [phone]  Home Blood Pressure Monitoring for Patients   Your provider has recommended that you check your  blood pressure (BP) at least once a week at home. If you do not have a blood pressure cuff at home, one will be provided for you. Contact your provider if you have not received your monitor within 1 week.   Helpful Tips for Accurate Home Blood Pressure Checks  Don't smoke, exercise, or drink caffeine  30 minutes before checking your BP Use the restroom before checking your BP (a full bladder can raise your  pressure) Relax in a comfortable upright chair Feet on the ground Left arm resting comfortably on a flat surface at the level of your heart Legs uncrossed Back supported Sit quietly and don't talk Place the cuff on your bare arm Adjust snuggly, so that only two fingertips can fit between your skin and the top of the cuff Check 2 readings separated by at least one minute Keep a log of your BP readings For a visual, please reference this diagram: http://ccnc.care/bpdiagram  Provider Name: [facility]     Phone: [phone]  Zone 1: ALL CLEAR  Continue to monitor your symptoms:  BP reading is less than 140 (top number) or less than 90 (bottom number)  No right upper stomach pain No headaches or seeing spots No feeling nauseated or throwing up No swelling in face and hands  Zone 2: CAUTION Call your doctor's office for any of the following:  BP reading is greater than 140 (top number) or greater than 90 (bottom number)  Stomach pain under your ribs in the middle or right side Headaches or seeing spots Feeling nauseated or throwing up Swelling in face and hands  Zone 3: EMERGENCY  Seek immediate medical care if you have any of the following:  BP reading is greater than160 (top number) or greater than 110 (bottom number) Severe headaches not improving with Tylenol  Serious difficulty catching your breath Any worsening symptoms from Zone 2  Preterm Labor and Birth Information  The normal length of a pregnancy is 39-41 weeks. Preterm labor is when labor starts before 37 completed weeks of pregnancy. What are the risk factors for preterm labor? Preterm labor is more likely to occur in women who: Have certain infections during pregnancy such as a bladder infection, sexually transmitted infection, or infection inside the uterus (chorioamnionitis). Have a shorter-than-normal cervix. Have gone into preterm labor before. Have had surgery on their cervix. Are younger than age 69  or older than age 16. Are African American. Are pregnant with twins or multiple babies (multiple gestation). Take street drugs or smoke while pregnant. Do not gain enough weight while pregnant. Became pregnant shortly after having been pregnant. What are the symptoms of preterm labor? Symptoms of preterm labor include: Cramps similar to those that can happen during a menstrual period. The cramps may happen with diarrhea. Pain in the abdomen or lower back. Regular uterine contractions that may feel like tightening of the abdomen. A feeling of increased pressure in the pelvis. Increased watery or bloody mucus discharge from the vagina. Water  breaking (ruptured amniotic sac). Why is it important to recognize signs of preterm labor? It is important to recognize signs of preterm labor because babies who are born prematurely may not be fully developed. This can put them at an increased risk for: Long-term (chronic) heart and lung problems. Difficulty immediately after birth with regulating body systems, including blood sugar, body temperature, heart rate, and breathing rate. Bleeding in the brain. Cerebral palsy. Learning difficulties. Death. These risks are highest for babies who are born before 34 weeks  of pregnancy. How is preterm labor treated? Treatment depends on the length of your pregnancy, your condition, and the health of your baby. It may involve: Having a stitch (suture) placed in your cervix to prevent your cervix from opening too early (cerclage). Taking or being given medicines, such as: Hormone medicines. These may be given early in pregnancy to help support the pregnancy. Medicine to stop contractions. Medicines to help mature the babys lungs. These may be prescribed if the risk of delivery is high. Medicines to prevent your baby from developing cerebral palsy. If the labor happens before 34 weeks of pregnancy, you may need to stay in the hospital. What should I do if I  think I am in preterm labor? If you think that you are going into preterm labor, call your health care provider right away. How can I prevent preterm labor in future pregnancies? To increase your chance of having a full-term pregnancy: Do not use any tobacco products, such as cigarettes, chewing tobacco, and e-cigarettes. If you need help quitting, ask your health care provider. Do not use street drugs or medicines that have not been prescribed to you during your pregnancy. Talk with your health care provider before taking any herbal supplements, even if you have been taking them regularly. Make sure you gain a healthy amount of weight during your pregnancy. Watch for infection. If you think that you might have an infection, get it checked right away. Make sure to tell your health care provider if you have gone into preterm labor before. This information is not intended to replace advice given to you by your health care provider. Make sure you discuss any questions you have with your health care provider. Document Revised: 03/03/2019 Document Reviewed: 04/01/2016 Elsevier Patient Education  2020 [employer].

## 2024-12-12 NOTE — Progress Notes (Signed)
 "  HIGH-RISK PREGNANCY VISIT Patient name: Erin Krueger MRN [id number]  Date of birth: Apr 11, 1996 Chief Complaint:   Routine Prenatal Visit  History of Present Illness:   Erin Krueger is a 29 y.o. [id number] female at [redacted]w[redacted]d with an Estimated Date of Delivery: 01/29/25 being seen today for ongoing management of a high-risk pregnancy complicated by chronic hypertension currently on no meds and diabetes mellitus A2DM currently on metformin  500mg  q am.    Today she reports fastings 85-109 (4 of 7 ^), 2hr pp 110-190 (15 of 18 ^). Hasn't seen dietician yet, hasn't changed diet. Vaginal d/c, no itching/odor/irritation. Some spotting. Contractions: Not present. Vag. Bleeding: None.  Movement: Present. denies leaking of fluid.      07/20/2024    3:43 PM 06/17/2022   10:05 AM 03/26/2022    2:13 PM 03/06/2022    9:48 AM 01/22/2022    4:50 PM  Depression screen PHQ 2/9  Decreased Interest 0 0   0  Down, Depressed, Hopeless 0 0   0  PHQ - 2 Score 0 0   0  Altered sleeping 0 3   0  Tired, decreased energy 0 3   0  Change in appetite 0 0   0  Feeling bad or failure about yourself  0 0   0  Trouble concentrating 0 3   0  Moving slowly or fidgety/restless 0 0   0  Suicidal thoughts 0 0   0  PHQ-9 Score 0  9    0   Difficult doing work/chores          Information is confidential and restricted. Go to Review Flowsheets to unlock data.   Data saved with a previous flowsheet row definition        07/20/2024    3:43 PM 06/17/2022   10:07 AM 03/26/2022    2:15 PM 03/06/2022    9:24 AM  GAD 7 : Generalized Anxiety Score  Nervous, Anxious, on Edge 0  0     Control/stop worrying 0  0     Worry too much - different things 0  0     Trouble relaxing 0  1     Restless 0  3     Easily annoyed or irritable 0  0     Afraid - awful might happen 0  0     Total GAD 7 Score 0 4    Anxiety Difficulty         Information is confidential and restricted. Go to Review Flowsheets to unlock data.   Data saved with a  previous flowsheet row definition     Review of Systems:   Pertinent items are noted in HPI Denies abnormal vaginal discharge w/ itching/odor/irritation, headaches, visual changes, shortness of breath, chest pain, abdominal pain, severe nausea/vomiting, or problems with urination or bowel movements unless otherwise stated above. Pertinent History Reviewed:  Reviewed past medical,surgical, social, obstetrical and family history.  Reviewed problem list, medications and allergies. Physical Assessment:   Vitals:   12/12/24 1503 12/12/24 1510  BP: (!) 75/38 118/77  Pulse: (!) 101 (!) 101  Weight: 254 lb 9.6 oz (115.5 kg)   Body mass index is 41.09 kg/m.           Physical Examination:   General appearance: alert, well appearing, and in no distress  Mental status: alert, oriented to person, place, and time  Skin: warm & dry   Extremities: Edema: None    Cardiovascular:  normal heart rate noted  Respiratory: normal respiratory effort, no distress  Abdomen: gravid, soft, non-tender  Pelvic: spec exam- cx visually closed, some mucous        Fetal Status:     Movement: Present    Fetal Surveillance Testing today: US  33+1 wks,cephalic,BPP 8/8,anterior placenta gr 1,AFI 17 cm,FHR 146 bpm,RI .60,.71,.56=66%   Chaperone: Aleck Blase  Urine: did not void  No results found for this or any previous visit (from the past 24 hours).  Assessment & Plan:  High-risk pregnancy: [id number] at [redacted]w[redacted]d with an Estimated Date of Delivery: 01/29/25   1) A2DM, uncontrolled, increase am metformin  to 1000mg  and start 500mg  at bedtime, new rx sent. Reschedule dietician appt asap. Dicussed decreasing carbs/increasing protein, activity after meals, high protein snack at bedtime. Discussed importance of strict glycemic control and adherence to low carb diet during pregnancy as well as potential complications from uncontrolled diabetes during pregnancy.   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 Start bactrim , restart nightly macrobid  suppression (was going to switch to keflex , but anaphylactic allergy to pcn)  4) Prev c/s> for RCS w/ BTS (12/31)  5) Seizures- on lamictal /keppra   6) Vaginal d/c> CV swab  Meds:  Meds ordered this encounter  Medications   metFORMIN  (GLUCOPHAGE ) 500 MG tablet    Sig: Take 1,000mg  (2 tabs) in the morning and 500mg  (1 tab) at bedtime    Dispense:  90 tablet    Refill:  3   nitrofurantoin , macrocrystal-monohydrate, (MACROBID ) 100 MG capsule    Sig: Take 1 capsule (100 mg total) by mouth at bedtime. After you are finished w/ bactrim  rx    Dispense:  30 capsule    Refill:  3    Labs/procedures today: CV swab and U/S  Treatment Plan:  EFW q 4wks       2x/wk NST or weekly bpp   Deliver 37-38.0wks (if sugars remain uncontrolled) or as per MFM:_____   Reviewed: Preterm labor symptoms and general obstetric precautions including but not limited to vaginal bleeding, contractions, leaking of fluid and fetal movement were reviewed in detail with the patient.  All questions were answered. Does have home bp cuff. Office bp cuff given: not applicable. Check bp weekly, let us  know if consistently >140 and/or >90.  Follow-up: Return for As scheduled-MD Only rest of pregnancy.   Future Appointments  Date Time Provider Department Center  12/19/2024  2:15 PM [facility] US  [facility] None  12/19/2024  3:10 PM Marilynn Nest, DO [facility]  12/26/2024  2:15 PM [facility] IMG 2 [facility] None  12/26/2024  3:10 PM Kizzie Suzen SAUNDERS, CNM [facility]  01/02/2025  3:00 PM [facility] - [facility] US  [facility] None  01/02/2025  3:50 PM Jayne Vonn DEL, MD [facility]  01/09/2025  3:00 PM [facility] - [facility] US  [facility] None  01/09/2025  3:50 PM Marilynn Nest, DO [facility]  01/16/2025  3:00 PM [facility] - [facility] US  [facility] None  01/16/2025  3:50 PM Kizzie Suzen SAUNDERS, CNM [facility]  01/23/2025  3:00 PM [facility] IMG 2 [facility] None  01/23/2025   3:50 PM Roseanne Juenger, Suzen SAUNDERS, CNM [facility]    No orders of the defined types were placed in this encounter.  Suzen SAUNDERS Kizzie CNM, [facility] 12/12/2024 3:39 PM  "

## 2024-12-12 NOTE — Progress Notes (Signed)
 US  33+1 wks,cephalic,BPP 8/8,anterior placenta gr 1,AFI 17 cm,FHR 146 bpm,RI .60,.71,.56=66%

## 2024-12-14 ENCOUNTER — Ambulatory Visit: Payer: Self-pay | Admitting: Women's Health

## 2024-12-14 LAB — CERVICOVAGINAL ANCILLARY ONLY
Bacterial Vaginitis (gardnerella): NEGATIVE
Candida Glabrata: NEGATIVE
Candida Vaginitis: NEGATIVE
Chlamydia: NEGATIVE
Comment: NEGATIVE
Comment: NEGATIVE
Comment: NEGATIVE
Comment: NEGATIVE
Comment: NEGATIVE
Comment: NORMAL
Neisseria Gonorrhea: NEGATIVE
Trichomonas: NEGATIVE

## 2024-12-15 ENCOUNTER — Other Ambulatory Visit: Payer: Self-pay | Admitting: Obstetrics & Gynecology

## 2024-12-15 DIAGNOSIS — O10919 Unspecified pre-existing hypertension complicating pregnancy, unspecified trimester: Secondary | ICD-10-CM

## 2024-12-19 ENCOUNTER — Other Ambulatory Visit

## 2024-12-19 ENCOUNTER — Encounter: Admitting: Obstetrics & Gynecology

## 2024-12-20 ENCOUNTER — Other Ambulatory Visit: Payer: Self-pay | Admitting: Obstetrics & Gynecology

## 2024-12-20 DIAGNOSIS — O24419 Gestational diabetes mellitus in pregnancy, unspecified control: Secondary | ICD-10-CM

## 2024-12-20 DIAGNOSIS — O10919 Unspecified pre-existing hypertension complicating pregnancy, unspecified trimester: Secondary | ICD-10-CM

## 2024-12-20 DIAGNOSIS — O09299 Supervision of pregnancy with other poor reproductive or obstetric history, unspecified trimester: Secondary | ICD-10-CM

## 2024-12-20 DIAGNOSIS — Z8759 Personal history of other complications of pregnancy, childbirth and the puerperium: Secondary | ICD-10-CM

## 2024-12-20 DIAGNOSIS — O34219 Maternal care for unspecified type scar from previous cesarean delivery: Secondary | ICD-10-CM

## 2024-12-20 DIAGNOSIS — Z6838 Body mass index (BMI) 38.0-38.9, adult: Secondary | ICD-10-CM

## 2024-12-20 DIAGNOSIS — Z3A34 34 weeks gestation of pregnancy: Secondary | ICD-10-CM

## 2024-12-21 ENCOUNTER — Encounter: Admitting: Women's Health

## 2024-12-21 ENCOUNTER — Other Ambulatory Visit: Payer: Self-pay | Admitting: Obstetrics & Gynecology

## 2024-12-21 ENCOUNTER — Other Ambulatory Visit: Admitting: Radiology

## 2024-12-21 DIAGNOSIS — O34219 Maternal care for unspecified type scar from previous cesarean delivery: Secondary | ICD-10-CM

## 2024-12-21 DIAGNOSIS — O09299 Supervision of pregnancy with other poor reproductive or obstetric history, unspecified trimester: Secondary | ICD-10-CM

## 2024-12-21 DIAGNOSIS — Z8759 Personal history of other complications of pregnancy, childbirth and the puerperium: Secondary | ICD-10-CM

## 2024-12-21 DIAGNOSIS — Z3A35 35 weeks gestation of pregnancy: Secondary | ICD-10-CM

## 2024-12-21 DIAGNOSIS — O24419 Gestational diabetes mellitus in pregnancy, unspecified control: Secondary | ICD-10-CM

## 2024-12-21 DIAGNOSIS — O10919 Unspecified pre-existing hypertension complicating pregnancy, unspecified trimester: Secondary | ICD-10-CM

## 2024-12-21 DIAGNOSIS — Z6838 Body mass index (BMI) 38.0-38.9, adult: Secondary | ICD-10-CM

## 2024-12-24 ENCOUNTER — Inpatient Hospital Stay (HOSPITAL_COMMUNITY)
Admission: AD | Admit: 2024-12-24 | Discharge: 2024-12-26 | DRG: 784 | Disposition: A | Attending: Family Medicine | Admitting: Family Medicine

## 2024-12-24 ENCOUNTER — Encounter (HOSPITAL_COMMUNITY): Payer: Self-pay

## 2024-12-24 ENCOUNTER — Encounter (HOSPITAL_COMMUNITY): Admission: AD | Disposition: A | Payer: Self-pay | Source: Home / Self Care | Attending: Family Medicine

## 2024-12-24 ENCOUNTER — Other Ambulatory Visit: Payer: Self-pay

## 2024-12-24 ENCOUNTER — Inpatient Hospital Stay (HOSPITAL_COMMUNITY): Admitting: Certified Registered Nurse Anesthetist

## 2024-12-24 ENCOUNTER — Telehealth: Payer: Self-pay | Admitting: Obstetrics & Gynecology

## 2024-12-24 ENCOUNTER — Encounter (HOSPITAL_COMMUNITY): Payer: Self-pay | Admitting: Family Medicine

## 2024-12-24 DIAGNOSIS — G40909 Epilepsy, unspecified, not intractable, without status epilepticus: Secondary | ICD-10-CM

## 2024-12-24 DIAGNOSIS — O99354 Diseases of the nervous system complicating childbirth: Secondary | ICD-10-CM | POA: Diagnosis present

## 2024-12-24 DIAGNOSIS — O34211 Maternal care for low transverse scar from previous cesarean delivery: Secondary | ICD-10-CM | POA: Diagnosis present

## 2024-12-24 DIAGNOSIS — Z7982 Long term (current) use of aspirin: Secondary | ICD-10-CM

## 2024-12-24 DIAGNOSIS — Z3A34 34 weeks gestation of pregnancy: Secondary | ICD-10-CM | POA: Diagnosis not present

## 2024-12-24 DIAGNOSIS — Z98891 History of uterine scar from previous surgery: Principal | ICD-10-CM

## 2024-12-24 DIAGNOSIS — E66813 Obesity, class 3: Secondary | ICD-10-CM | POA: Diagnosis present

## 2024-12-24 DIAGNOSIS — O9962 Diseases of the digestive system complicating childbirth: Secondary | ICD-10-CM | POA: Diagnosis present

## 2024-12-24 DIAGNOSIS — K219 Gastro-esophageal reflux disease without esophagitis: Secondary | ICD-10-CM | POA: Diagnosis present

## 2024-12-24 DIAGNOSIS — F445 Conversion disorder with seizures or convulsions: Secondary | ICD-10-CM | POA: Diagnosis present

## 2024-12-24 DIAGNOSIS — Z8759 Personal history of other complications of pregnancy, childbirth and the puerperium: Secondary | ICD-10-CM

## 2024-12-24 DIAGNOSIS — O24429 Gestational diabetes mellitus in childbirth, unspecified control: Secondary | ICD-10-CM

## 2024-12-24 DIAGNOSIS — O10919 Unspecified pre-existing hypertension complicating pregnancy, unspecified trimester: Secondary | ICD-10-CM | POA: Diagnosis present

## 2024-12-24 DIAGNOSIS — Z79899 Other long term (current) drug therapy: Secondary | ICD-10-CM

## 2024-12-24 DIAGNOSIS — O99210 Obesity complicating pregnancy, unspecified trimester: Secondary | ICD-10-CM | POA: Diagnosis present

## 2024-12-24 DIAGNOSIS — O114 Pre-existing hypertension with pre-eclampsia, complicating childbirth: Principal | ICD-10-CM | POA: Diagnosis present

## 2024-12-24 DIAGNOSIS — O24419 Gestational diabetes mellitus in pregnancy, unspecified control: Secondary | ICD-10-CM | POA: Diagnosis present

## 2024-12-24 DIAGNOSIS — Z302 Encounter for sterilization: Secondary | ICD-10-CM | POA: Diagnosis not present

## 2024-12-24 DIAGNOSIS — O09299 Supervision of pregnancy with other poor reproductive or obstetric history, unspecified trimester: Secondary | ICD-10-CM

## 2024-12-24 DIAGNOSIS — R8271 Bacteriuria: Secondary | ICD-10-CM | POA: Diagnosis present

## 2024-12-24 DIAGNOSIS — O1092 Unspecified pre-existing hypertension complicating childbirth: Secondary | ICD-10-CM | POA: Diagnosis present

## 2024-12-24 DIAGNOSIS — Z833 Family history of diabetes mellitus: Secondary | ICD-10-CM

## 2024-12-24 DIAGNOSIS — Z87891 Personal history of nicotine dependence: Secondary | ICD-10-CM

## 2024-12-24 DIAGNOSIS — O99891 Other specified diseases and conditions complicating pregnancy: Secondary | ICD-10-CM | POA: Diagnosis present

## 2024-12-24 DIAGNOSIS — O24425 Gestational diabetes mellitus in childbirth, controlled by oral hypoglycemic drugs: Secondary | ICD-10-CM | POA: Diagnosis present

## 2024-12-24 DIAGNOSIS — Z8279 Family history of other congenital malformations, deformations and chromosomal abnormalities: Secondary | ICD-10-CM

## 2024-12-24 DIAGNOSIS — O0990 Supervision of high risk pregnancy, unspecified, unspecified trimester: Principal | ICD-10-CM

## 2024-12-24 DIAGNOSIS — O99214 Obesity complicating childbirth: Secondary | ICD-10-CM | POA: Diagnosis present

## 2024-12-24 LAB — COMPREHENSIVE METABOLIC PANEL WITH GFR
ALT: 9 U/L (ref 0–44)
AST: 17 U/L (ref 15–41)
Albumin: 3.4 g/dL — ABNORMAL LOW (ref 3.5–5.0)
Alkaline Phosphatase: 134 U/L — ABNORMAL HIGH (ref 38–126)
Anion gap: 15 (ref 5–15)
BUN: 7 mg/dL (ref 6–20)
CO2: 16 mmol/L — ABNORMAL LOW (ref 22–32)
Calcium: 7.9 mg/dL — ABNORMAL LOW (ref 8.9–10.3)
Chloride: 104 mmol/L (ref 98–111)
Creatinine, Ser: 0.56 mg/dL (ref 0.44–1.00)
GFR, Estimated: >60 mL/min
Glucose, Bld: 98 mg/dL (ref 70–99)
Potassium: 3.9 mmol/L (ref 3.5–5.1)
Sodium: 135 mmol/L (ref 135–145)
Total Bilirubin: 0.4 mg/dL (ref 0.0–1.2)
Total Protein: 6.8 g/dL (ref 6.5–8.1)

## 2024-12-24 LAB — CBC
HCT: 35.4 % — ABNORMAL LOW (ref 36.0–46.0)
Hemoglobin: 11.7 g/dL — ABNORMAL LOW (ref 12.0–15.0)
MCH: 28.2 pg (ref 26.0–34.0)
MCHC: 33.1 g/dL (ref 30.0–36.0)
MCV: 85.3 fL (ref 80.0–100.0)
Platelets: 239 10*3/uL (ref 150–400)
RBC: 4.15 MIL/uL (ref 3.87–5.11)
RDW: 13.8 % (ref 11.5–15.5)
WBC: 15.4 10*3/uL — ABNORMAL HIGH (ref 4.0–10.5)
nRBC: 0 % (ref 0.0–0.2)

## 2024-12-24 LAB — GLUCOSE, CAPILLARY: Glucose-Capillary: 93 mg/dL (ref 70–99)

## 2024-12-24 LAB — URINALYSIS, ROUTINE W REFLEX MICROSCOPIC
Bilirubin Urine: NEGATIVE
Glucose, UA: NEGATIVE mg/dL
Ketones, ur: 5 mg/dL — AB
Nitrite: NEGATIVE
Protein, ur: 30 mg/dL — AB
RBC / HPF: >50 RBC/hpf (ref 0–5)
Specific Gravity, Urine: 1.024 (ref 1.005–1.030)
pH: 6 (ref 5.0–8.0)

## 2024-12-24 LAB — PROTEIN / CREATININE RATIO, URINE
Creatinine, Urine: 204 mg/dL
Protein Creatinine Ratio: 0.2 mg/mg — ABNORMAL HIGH
Total Protein, Urine: 46 mg/dL

## 2024-12-24 LAB — TYPE AND SCREEN
ABO/RH(D): O POS
Antibody Screen: NEGATIVE

## 2024-12-24 MED ORDER — STERILE WATER FOR IRRIGATION IR SOLN
Status: DC | PRN
  Administered 2024-12-24: 1000 mL

## 2024-12-24 MED ORDER — LAMOTRIGINE 100 MG PO TABS
100.0000 mg | ORAL_TABLET | Freq: Two times a day (BID) | ORAL | Status: DC
  Administered 2024-12-25 – 2024-12-26 (×3): 100 mg via ORAL
  Filled 2024-12-24 (×4): qty 1

## 2024-12-24 MED ORDER — MORPHINE SULFATE (PF) 0.5 MG/ML IJ SOLN
INTRAMUSCULAR | Status: DC | PRN
  Administered 2024-12-24: 150 ug via INTRATHECAL

## 2024-12-24 MED ORDER — PHENYLEPHRINE 80 MCG/ML (10ML) SYRINGE FOR IV PUSH (FOR BLOOD PRESSURE SUPPORT)
PREFILLED_SYRINGE | INTRAVENOUS | Status: DC | PRN
  Administered 2024-12-24: 80 ug via INTRAVENOUS
  Administered 2024-12-24: 160 ug via INTRAVENOUS

## 2024-12-24 MED ORDER — NALOXONE HCL 0.4 MG/ML IJ SOLN: 0.4000 mg | INTRAMUSCULAR | Status: DC | PRN

## 2024-12-24 MED ORDER — TRANEXAMIC ACID-NACL 1000-0.7 MG/100ML-% IV SOLN
INTRAVENOUS | Status: DC | PRN
  Administered 2024-12-24: 1000 mg via INTRAVENOUS

## 2024-12-24 MED ORDER — LACTATED RINGERS IV SOLN
INTRAVENOUS | Status: AC
  Administered 2024-12-24: 500 mL via INTRAVENOUS

## 2024-12-24 MED ORDER — SOD CITRATE-CITRIC ACID 500-334 MG/5ML PO SOLN
30.0000 mL | Freq: Once | ORAL | Status: AC
  Administered 2024-12-24: 30 mL via ORAL
  Filled 2024-12-24: qty 30

## 2024-12-24 MED ORDER — SODIUM CHLORIDE 0.9 % IR SOLN
Status: DC | PRN
  Administered 2024-12-24: 1

## 2024-12-24 MED ORDER — KETOROLAC TROMETHAMINE 30 MG/ML IJ SOLN
INTRAMUSCULAR | Status: AC
  Filled 2024-12-24: qty 1

## 2024-12-24 MED ORDER — LEVETIRACETAM 750 MG PO TABS
1500.0000 mg | ORAL_TABLET | Freq: Once | ORAL | Status: AC
  Administered 2024-12-24: 1500 mg via ORAL
  Filled 2024-12-24: qty 2

## 2024-12-24 MED ORDER — MAGNESIUM SULFATE 40 GM/1000ML IV SOLN
INTRAVENOUS | Status: AC
  Filled 2024-12-24: qty 1000

## 2024-12-24 MED ORDER — OXYTOCIN-SODIUM CHLORIDE 30-0.9 UT/500ML-% IV SOLN
INTRAVENOUS | Status: AC
  Filled 2024-12-24: qty 500

## 2024-12-24 MED ORDER — PHENYLEPHRINE HCL-NACL 20-0.9 MG/250ML-% IV SOLN
INTRAVENOUS | Status: DC | PRN
  Administered 2024-12-24: 60 ug/min via INTRAVENOUS

## 2024-12-24 MED ORDER — CYCLOBENZAPRINE HCL 10 MG PO TABS
10.0000 mg | ORAL_TABLET | Freq: Once | ORAL | Status: AC
  Administered 2024-12-24: 10 mg via ORAL
  Filled 2024-12-24: qty 1

## 2024-12-24 MED ORDER — DEXAMETHASONE SOD PHOSPHATE PF 10 MG/ML IJ SOLN
INTRAMUSCULAR | Status: AC
  Filled 2024-12-24: qty 1

## 2024-12-24 MED ORDER — CLINDAMYCIN PHOSPHATE 900 MG/50ML IV SOLN: 900.0000 mg | INTRAVENOUS | Status: DC

## 2024-12-24 MED ORDER — ONDANSETRON HCL 4 MG/2ML IJ SOLN
INTRAMUSCULAR | Status: AC
  Filled 2024-12-24: qty 2

## 2024-12-24 MED ORDER — FENTANYL CITRATE (PF) 100 MCG/2ML IJ SOLN: 25.0000 ug | INTRAMUSCULAR | Status: DC | PRN

## 2024-12-24 MED ORDER — CLINDAMYCIN PHOSPHATE 900 MG/50ML IV SOLN
INTRAVENOUS | Status: DC | PRN
  Administered 2024-12-24: 900 mg via INTRAVENOUS

## 2024-12-24 MED ORDER — LAMOTRIGINE 100 MG PO TABS
100.0000 mg | ORAL_TABLET | Freq: Once | ORAL | Status: AC
  Administered 2024-12-24: 100 mg via ORAL
  Filled 2024-12-24: qty 1

## 2024-12-24 MED ORDER — FAMOTIDINE IN NACL 20-0.9 MG/50ML-% IV SOLN: 20.0000 mg | Freq: Once | INTRAVENOUS | Status: DC

## 2024-12-24 MED ORDER — DIPHENHYDRAMINE HCL 25 MG PO CAPS: 25.0000 mg | ORAL_CAPSULE | Freq: Four times a day (QID) | ORAL | Status: DC | PRN

## 2024-12-24 MED ORDER — ONDANSETRON HCL 4 MG/2ML IJ SOLN
4.0000 mg | Freq: Three times a day (TID) | INTRAMUSCULAR | Status: DC | PRN
  Administered 2024-12-25: 4 mg via INTRAVENOUS
  Filled 2024-12-24: qty 2

## 2024-12-24 MED ORDER — BUPIVACAINE HCL (PF) 0.25 % IJ SOLN
INTRAMUSCULAR | Status: DC | PRN
  Administered 2024-12-24: 12 mL

## 2024-12-24 MED ORDER — SODIUM CHLORIDE 0.9% FLUSH: 3.0000 mL | INTRAVENOUS | Status: DC | PRN

## 2024-12-24 MED ORDER — KETOROLAC TROMETHAMINE 30 MG/ML IJ SOLN: 30.0000 mg | Freq: Four times a day (QID) | INTRAMUSCULAR | Status: DC | PRN

## 2024-12-24 MED ORDER — OXYTOCIN-SODIUM CHLORIDE 30-0.9 UT/500ML-% IV SOLN
INTRAVENOUS | Status: DC | PRN
  Administered 2024-12-24: 300 mL via INTRAVENOUS

## 2024-12-24 MED ORDER — LACTATED RINGERS IV BOLUS
1000.0000 mL | Freq: Once | INTRAVENOUS | Status: AC
  Administered 2024-12-24: 1000 mL via INTRAVENOUS

## 2024-12-24 MED ORDER — TRANEXAMIC ACID-NACL 1000-0.7 MG/100ML-% IV SOLN: 1000.0000 mg | Freq: Once | INTRAVENOUS | Status: DC

## 2024-12-24 MED ORDER — ACETAMINOPHEN 500 MG PO TABS: 1000.0000 mg | ORAL_TABLET | Freq: Four times a day (QID) | ORAL | Status: DC

## 2024-12-24 MED ORDER — KETOROLAC TROMETHAMINE 30 MG/ML IJ SOLN
30.0000 mg | Freq: Once | INTRAMUSCULAR | Status: AC | PRN
  Administered 2024-12-24: 30 mg via INTRAVENOUS

## 2024-12-24 MED ORDER — GENTAMICIN SULFATE 40 MG/ML IJ SOLN
5.0000 mg/kg | INTRAVENOUS | Status: AC
  Administered 2024-12-24: 409.6 mg via INTRAVENOUS
  Filled 2024-12-24: qty 10.25

## 2024-12-24 MED ORDER — DIPHENHYDRAMINE HCL 50 MG/ML IJ SOLN
12.5000 mg | Freq: Four times a day (QID) | INTRAMUSCULAR | Status: DC | PRN
  Administered 2024-12-25: 12.5 mg via INTRAVENOUS
  Filled 2024-12-24: qty 1

## 2024-12-24 MED ORDER — FENTANYL CITRATE (PF) 100 MCG/2ML IJ SOLN
INTRAMUSCULAR | Status: AC
  Filled 2024-12-24: qty 2

## 2024-12-24 MED ORDER — LABETALOL HCL 5 MG/ML IV SOLN
20.0000 mg | Freq: Once | INTRAVENOUS | Status: AC
  Administered 2024-12-24: 20 mg via INTRAVENOUS
  Filled 2024-12-24: qty 4

## 2024-12-24 MED ORDER — PHENYLEPHRINE HCL-NACL 20-0.9 MG/250ML-% IV SOLN
INTRAVENOUS | Status: AC
  Filled 2024-12-24: qty 250

## 2024-12-24 MED ORDER — ONDANSETRON HCL 4 MG/2ML IJ SOLN
INTRAMUSCULAR | Status: DC | PRN
  Administered 2024-12-24: 4 mg via INTRAVENOUS

## 2024-12-24 MED ORDER — FENTANYL CITRATE (PF) 100 MCG/2ML IJ SOLN
INTRAMUSCULAR | Status: DC | PRN
  Administered 2024-12-24: 15 ug via INTRATHECAL

## 2024-12-24 MED ORDER — MORPHINE SULFATE (PF) 0.5 MG/ML IJ SOLN
INTRAMUSCULAR | Status: AC
  Filled 2024-12-24: qty 10

## 2024-12-24 MED ORDER — SOD CITRATE-CITRIC ACID 500-334 MG/5ML PO SOLN: 30.0000 mL | ORAL | Status: DC

## 2024-12-24 MED ORDER — BUPIVACAINE IN DEXTROSE 0.75-8.25 % IT SOLN
INTRATHECAL | Status: DC | PRN
  Administered 2024-12-24: 1.6 mL via INTRATHECAL

## 2024-12-24 MED ORDER — PROMETHAZINE (PHENERGAN) 6.25MG IN NS 50ML IVPB: 6.2500 mg | INTRAVENOUS | Status: DC | PRN

## 2024-12-24 MED ORDER — LEVETIRACETAM 750 MG PO TABS
1500.0000 mg | ORAL_TABLET | Freq: Two times a day (BID) | ORAL | Status: DC
  Administered 2024-12-25 – 2024-12-26 (×3): 1500 mg via ORAL
  Filled 2024-12-24 (×4): qty 2

## 2024-12-24 MED ORDER — BUPIVACAINE HCL (PF) 0.25 % IJ SOLN
INTRAMUSCULAR | Status: AC
  Filled 2024-12-24: qty 30

## 2024-12-24 MED ORDER — VENLAFAXINE HCL ER 75 MG PO CP24
75.0000 mg | ORAL_CAPSULE | Freq: Every day | ORAL | Status: DC
  Administered 2024-12-25 – 2024-12-26 (×2): 75 mg via ORAL
  Filled 2024-12-24 (×2): qty 1

## 2024-12-24 MED ORDER — ACETAMINOPHEN-CAFFEINE 500-65 MG PO TABS
2.0000 | ORAL_TABLET | Freq: Once | ORAL | Status: AC
  Administered 2024-12-24: 2 via ORAL
  Filled 2024-12-24: qty 2

## 2024-12-24 NOTE — MAU Provider Note (Signed)
 " History     CSN: [id number]  Arrival date and time: 12/24/24 8171   Event Date/Time   First Provider Initiated Contact with Patient 12/24/24 1928      Chief Complaint  Patient presents with   Headache   Back Pain   Contractions   HPI Erin Krueger is a 29 y.o. [id number] at [redacted]w[redacted]d who presents with abdominal pain & headache.  Reports intermittent pelvic pain & abdominal tightening all day. Can't tell how frequent they are but thinks it happened more than 10 times per hour. Has been nauseated since this morning & unable to eat solids without gagging but has not vomited. Took zofran  this afternoon. Denies fever/chills, dysuria, vaginal bleeding, or LOF. +FM Hx of CHTN. No meds. Reports headache all day. Rates 5/10 after taking 400 mg of tylenol  earlier today. Consistent with hx of migraines. Denies visual disturbance or epigastric pain.  Hx of seizures. Initially thought last seizure was in November, but partner reports she had a grand mal seizure this morning around 230 am that lasted for several minutes. Patient states she didn't know about the seizure but also states she doesn't remember much from this morning.   OB History     Gravida  12   Para  6   Term  4   Preterm  2   AB  5   Living  5      SAB  5   IAB      Ectopic      Multiple  0   Live Births  5           Past Medical History:  Diagnosis Date   ADHD (attention deficit hyperactivity disorder)    Adopted person 10/04/2019   Does not know personal family history other than Diabetes, Breast Cancer. Raised in foster care   Allergy    Anxiety    Asthma    Epilepsy (HCC)    Headache(784.0)    History of eclampsia    History of pre-eclampsia    Kidney stone    kidney stones   Migraines    ODD (oppositional defiant disorder)    PID (acute pelvic inflammatory disease) 01/29/2016   PTSD (post-traumatic stress disorder)     Past Surgical History:  Procedure Laterality Date   CESAREAN SECTION   01/09/2023   DILATION AND CURETTAGE OF UTERUS     kidney stone removed  03/06/2024   right knee surgery     stent in left kidney  02/20/2024   TONSILLECTOMY AND ADENOIDECTOMY      Family History  Adopted: Yes  Problem Relation Age of Onset   Diabetes Sister    Other Daughter        born with hole in heart   Other Son        born with hole in heart   Breast cancer Maternal Grandmother    Diabetes Maternal Grandmother    Glaucoma Maternal Grandmother    Epilepsy Maternal Grandmother     Social History[1]  Allergies: Allergies[2]  Medications Prior to Admission  Medication Sig Dispense Refill Last Dose/Taking   acetaminophen  (TYLENOL ) 500 MG tablet Take 2 tablets (1,000 mg total) by mouth every 6 (six) hours as needed for mild pain (pain score 1-3) or moderate pain (pain score 4-6). 60 tablet 1 12/24/2024   ALBUTEROL  SULFATE IN Inhale into the lungs.   Past Week   aspirin  EC 81 MG tablet Take 1 tablet (81 mg total) by mouth  daily. 30 tablet 6 12/24/2024   cyclobenzaprine  (FLEXERIL ) 10 MG tablet Take 1 tablet (10 mg total) by mouth 3 (three) times daily as needed for muscle spasms. 60 tablet 2 12/23/2024   lamoTRIgine  (LAMICTAL ) 100 MG tablet TAKE 1 TABLET BY MOUTH TWICE A DAY 60 tablet 6 12/24/2024   levETIRAcetam  (KEPPRA ) 750 MG tablet TAKE 2 TABLETS (1,500 MG TOTAL) BY MOUTH 2 (TWO) TIMES DAILY. 360 tablet 1 12/24/2024   metFORMIN  (GLUCOPHAGE ) 500 MG tablet Take 1,000mg  (2 tabs) in the morning and 500mg  (1 tab) at bedtime 90 tablet 3 12/24/2024   nitrofurantoin , macrocrystal-monohydrate, (MACROBID ) 100 MG capsule Take 1 capsule (100 mg total) by mouth at bedtime. After you are finished w/ bactrim  rx 30 capsule 3 12/23/2024   ondansetron  (ZOFRAN -ODT) 4 MG disintegrating tablet Take 1 tablet (4 mg total) by mouth every 6 (six) hours as needed for nausea. 30 tablet 2 12/23/2024   Prenatal Vit-Fe Fumarate-FA (PRENATAL VITAMIN PO) Take 1 capsule by mouth daily.   12/24/2024   venlafaxine  XR  (EFFEXOR -XR) 75 MG 24 hr capsule Take 75 mg by mouth daily.   12/24/2024   Accu-Chek Softclix Lancets lancets Check blood sugar four times daily. 100 each 12    Blood Glucose Monitoring Suppl (ACCU-CHEK GUIDE ME) w/Device KIT 1 kit by Does not apply route as directed. 1 kit 0    Blood Pressure Monitor MISC For regular home bp monitoring during pregnancy 1 each 0    EPINEPHrine  0.3 mg/0.3 mL IJ SOAJ injection Inject 0.3 mg into the muscle as needed for anaphylaxis. 1 each 2    glucose blood (ACCU-CHEK GUIDE TEST) test strip Check Blood sugar four times a day. 100 each 12     Review of Systems  All other systems reviewed and are negative.  Physical Exam   Blood pressure (!) 127/96, pulse (!) 110, temperature 97.9 F (36.6 C), temperature source Oral, resp. rate 16, height 5' 6 (1.676 m), weight 115.7 kg, last menstrual period 04/24/2024, SpO2 98%, unknown if currently breastfeeding.  Patient Vitals for the past 24 hrs:  BP Temp Temp src Pulse Resp SpO2 Height Weight  12/24/24 2041 -- -- -- -- -- 99 % -- --  12/24/24 2035 (!) 141/84 -- -- (!) 113 -- 99 % -- --  12/24/24 2022 (!) 171/96 -- -- (!) 112 -- 95 % -- --  12/24/24 2018 (!) 127/96 -- -- (!) 110 -- -- -- --  12/24/24 2000 -- -- -- -- -- 98 % -- --  12/24/24 1954 130/87 -- -- -- -- 96 % -- --  12/24/24 1924 (!) 141/110 -- -- -- -- -- -- --  12/24/24 1920 104/76 -- -- (!) 118 -- 99 % -- --  12/24/24 1918 114/87 -- -- (!) 104 -- -- -- --  12/24/24 1841 (!) 158/118 97.9 F (36.6 C) Oral (!) 115 16 97 % 5' 6 (1.676 m) 115.7 kg     Physical Exam Vitals and nursing note reviewed.  Constitutional:      General: She is not in acute distress.    Appearance: She is well-developed. She is not ill-appearing.  HENT:     Head: Normocephalic and atraumatic.  Eyes:     General: No scleral icterus.       Right eye: No discharge.        Left eye: No discharge.     Conjunctiva/sclera: Conjunctivae normal.  Pulmonary:     Effort:  Pulmonary effort is normal. No respiratory distress.  Neurological:     General: No focal deficit present.     Mental Status: She is alert.  Psychiatric:        Mood and Affect: Mood normal.        Behavior: Behavior normal.    Dilation: Fingertip Effacement (%): Thick Station: -4 Surveyor, Minerals) Exam by:: Rocky Satterfield, NP   NST:  Baseline: 135 bpm, Variability: Good {> 6 bpm), Accelerations: Reactive, and Decelerations: Absent  MAU Course  Procedures Results for orders placed or performed during the hospital encounter of 12/24/24 (from the past 24 hours)  Protein / creatinine ratio, urine     Status: Abnormal   Collection Time: 12/24/24  6:54 PM  Result Value Ref Range   Creatinine, Urine 204 mg/dL   Total Protein, Urine 46 mg/dL   Protein Creatinine Ratio 0.2 (H) <0.2 mg/mg  Urinalysis, Routine w reflex microscopic -Urine, Clean Catch     Status: Abnormal   Collection Time: 12/24/24  6:54 PM  Result Value Ref Range   Color, Urine YELLOW YELLOW   APPearance HAZY (A) CLEAR   Specific Gravity, Urine 1.024 1.005 - 1.030   pH 6.0 5.0 - 8.0   Glucose, UA NEGATIVE NEGATIVE mg/dL   Hgb urine dipstick SMALL (A) NEGATIVE   Bilirubin Urine NEGATIVE NEGATIVE   Ketones, ur 5 (A) NEGATIVE mg/dL   Protein, ur 30 (A) NEGATIVE mg/dL   Nitrite NEGATIVE NEGATIVE   Leukocytes,Ua TRACE (A) NEGATIVE   RBC / HPF >50 0 - 5 RBC/hpf   WBC, UA 11-20 0 - 5 WBC/hpf   Bacteria, UA RARE (A) NONE SEEN   Squamous Epithelial / HPF 0-5 0 - 5 /HPF   Mucus PRESENT   CBC     Status: Abnormal   Collection Time: 12/24/24  7:15 PM  Result Value Ref Range   WBC 15.4 (H) 4.0 - 10.5 K/uL   RBC 4.15 3.87 - 5.11 MIL/uL   Hemoglobin 11.7 (L) 12.0 - 15.0 g/dL   HCT 64.5 (L) 63.9 - 53.9 %   MCV 85.3 80.0 - 100.0 fL   MCH 28.2 26.0 - 34.0 pg   MCHC 33.1 30.0 - 36.0 g/dL   RDW 86.1 88.4 - 84.4 %   Platelets 239 150 - 400 K/uL   nRBC 0.0 0.0 - 0.2 %  Comprehensive metabolic panel with GFR     Status: Abnormal    Collection Time: 12/24/24  7:15 PM  Result Value Ref Range   Sodium 135 135 - 145 mmol/L   Potassium 3.9 3.5 - 5.1 mmol/L   Chloride 104 98 - 111 mmol/L   CO2 16 (L) 22 - 32 mmol/L   Glucose, Bld 98 70 - 99 mg/dL   BUN 7 6 - 20 mg/dL   Creatinine, Ser 9.43 0.44 - 1.00 mg/dL   Calcium  7.9 (L) 8.9 - 10.3 mg/dL   Total Protein 6.8 6.5 - 8.1 g/dL   Albumin 3.4 (L) 3.5 - 5.0 g/dL   AST 17 15 - 41 U/L   ALT 9 0 - 44 U/L   Alkaline Phosphatase 134 (H) 38 - 126 U/L   Total Bilirubin 0.4 0.0 - 1.2 mg/dL   GFR, Estimated >39 >39 mL/min   Anion gap 15 5 - 15   No results found.  MDM   Assessment and Plan   CHTN si preeclampsia w/severe feature -CHTN no meds. Hx preeclampsia & eclampsia in previous pregnancies.  -Preeclampsia labs reassuring but does have labile BPs with >2 in the  severe range & a headache. Dr. Fredirick on unit to discuss delivery  Epilepsy -Hx of seizure disorder. Takes lamictal  & keppra  BID. Due to evening dose.  -Last seizure per her partner was this morning at 230 am -Evening dose of medications given in MAU   Rocky Satterfield 12/24/2024, 8:19 PM      [1]  Social History Tobacco Use   Smoking status: Former    Current packs/day: 0.00    Average packs/day: 0.5 packs/day    Types: Cigarettes, E-cigarettes    Quit date: 04/30/2021    Years since quitting: 3.6   Smokeless tobacco: Never   Tobacco comments:    nicotine  patch  Vaping Use   Vaping status: Former   Substances: CBD  Substance Use Topics   Alcohol use: Not Currently    Comment: drinks beer on weekends when not pregnant   Drug use: Not Currently    Types: Marijuana, Cocaine    Comment: Not since 2018  [2]  Allergies Allergen Reactions   Bee Venom Anaphylaxis   Ceftriaxone  Other (See Comments), Hives and Itching   Penicillins Anaphylaxis, Dermatitis, Hives and Itching    * Has tolerated several cephalosporins*  Has patient had a PCN reaction causing immediate rash, facial/tongue/throat  swelling, SOB or lightheadedness with hypotension: Unknown  Has patient had a PCN reaction causing severe rash involving mucus membranes or skin necrosis: Unknown  Has patient had a PCN reaction that required hospitalization: Unknown  Has patient had a PCN reaction occurring within the last 10 years: Unknown  If all of the above answers are NO, then may proceed with Cephalosporin use.  Has patient had a PCN reaction causing immediate rash, facial/tongue/throat swelling, SOB or lightheadedness with hypotension: Unknown    Has patient had a PCN reaction causing severe rash involving mucus membranes or skin necrosis: Unknown    Has patient had a PCN reaction that required hospitalization: Unknown    Has patient had a PCN reaction occurring within the last 10 years: Unknown    If all of the above answers are NO, then may proceed with Cephalosporin use.    * Has tolerated several cephalosporins* Has patient had a PCN reaction causing immediate rash, facial/tongue/throat swelling, SOB or lightheadedness with hypotension: Unknown Has patient had a PCN reaction causing severe rash involving mucus membranes or skin necrosis: Unknown Has patient had a PCN reaction that required hospitalization: Unknown Has patient had a PCN reaction occurring within the last 10 years: Unknown If all of the above answers are NO, then may proceed with Cephalosporin use.    Has tolerated cephalosporins   Lorazepam  Other (See Comments)    Aggressive-agitation   Prednisone  Other (See Comments)    Bleeding nose bleeding and internal bleeding  Bleeding nose bleeding and internal bleeding    Nose bleed    Other reaction(s): Other (See Comments) weakness    Nose bleed , weakness    Unknown to pt, reconciled from outside source   Aztreonam Itching   Latex Rash   "

## 2024-12-24 NOTE — MAU Note (Signed)
 Audrielle D Chunn is a 29 y.o. at [redacted]w[redacted]d here in MAU reporting: lost her mucus plug on Friday. States her husband checked her last night and said she felt dilated from what he could tell. Reporting lower back pain that comes and goes that comes around to sides. Also reporting a headache since Friday evening, has tried tylenol , excedrin, and advil  with no relief. Has chronic headaches/migraines. Denies any vision changes. Reports DFM, less than usual. Denies any LOF or VB.  Onset of complaint: ongoing worse today  Pain score: back 6 headache 4 Vitals:   12/24/24 1841  BP: (!) (P) 158/118  Pulse: (!) 115  Resp: 16  Temp: 97.9 F (36.6 C)  SpO2: 97%     FHT:142 Lab orders placed from triage:

## 2024-12-24 NOTE — Discharge Summary (Signed)
 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   Chronic hypertension affecting pregnancy   Seizure disorder during pregnancy, antepartum (HCC)   History of pre-eclampsia in prior pregnancy, currently pregnant   History of eclampsia   Obesity in pregnancy   Asymptomatic bacteriuria during pregnancy   History of cesarean delivery   Gestational diabetes  Additional problems: ***    Discharge diagnosis: Preterm Pregnancy Delivered, Preeclampsia (severe), CHTN with superimposed preeclampsia, and GDM A2                                              Post partum procedures:{Postpartum procedures:23558} Augmentation: N/A Complications: None  Hospital course: Sceduled C/S   29 y.o. yo [id number] at [redacted]w[redacted]d was admitted to the hospital 12/24/2024 for [facility] with SIPE with severe features by headache, and severe range BP, with prior and had a repeat cesarean section with Bilateral salpingectomy with the following indication:Elective Repeat.Delivery details are as follows:  Membrane Rupture Time/Date:  ,   Delivery Method:C-Section, Low Transverse Operative Delivery:N/A Details of operation can be found in separate operative note.  Patient had a postpartum course complicated by PP Magnesium  x 24 hours. Started on Procardia , lasix , K+ ***.  She is ambulating, tolerating a regular diet, passing flatus, and urinating well. Patient is discharged home in stable condition on  12/24/24        Newborn Data: Birth date:12/24/2024 Birth time:9:40  PM Gender:Female Living status:Living Apgars: ,  Weight:2820 g    Magnesium  Sulfate received: Yes: Seizure prophylaxis BMZ received: No Rhophylac:N/A MMR:N/A T-DaP:Given prenatally Flu: Yes RSV Vaccine received: No Transfusion:No  Immunizations received: Immunization History  Administered Date(s) Administered   Influenza, Seasonal, Injecte, Preservative Fre 11/07/2024   Influenza,inj,Quad PF,6+ Mos 08/15/2018   Influenza-Unspecified 06/27/2019, 09/17/2021, 09/30/2022, 07/26/2023   Tdap 08/15/2018, 11/07/2019, 12/09/2021, 12/01/2022, 11/07/2024    Physical exam  Vitals:   12/24/24 2018 12/24/24 2022 12/24/24 2035 12/24/24 2041  BP: (!) 127/96 (!) 171/96 (!) 141/84   Pulse: (!) 110 (!) 112 (!) 113   Resp:      Temp:      TempSrc:      SpO2:  95% 99% 99%  Weight:      Height:       General: alert, cooperative, and no distress Lochia: appropriate Uterine Fundus: firm Incision: Healing well with no significant drainage DVT Evaluation: No evidence of DVT seen on physical exam. Labs: Lab Results  Component Value Date   WBC 15.4 (H) 12/24/2024   HGB 11.7 (L) 12/24/2024   HCT 35.4 (L) 12/24/2024   MCV 85.3 12/24/2024   PLT 239 12/24/2024      Latest Ref Rng & Units 12/24/2024    7:15 PM  CMP  Glucose 70 - 99 mg/dL 98   BUN 6 - 20 mg/dL 7   Creatinine 9.55 - 8.99 mg/dL 9.43   Sodium 864 - 854 mmol/L 135   Potassium 3.5 - 5.1 mmol/L  3.9   Chloride 98 - 111 mmol/L 104   CO2 22 - 32 mmol/L 16   Calcium  8.9 - 10.3 mg/dL 7.9   Total Protein 6.5 - 8.1 g/dL 6.8   Total Bilirubin 0.0 - 1.2 mg/dL 0.4   Alkaline Phos 38 - 126 U/L 134   AST 15 - 41 U/L 17   ALT 0 - 44 U/L 9    Edinburgh Score:    02/11/2023    1:33 PM  Edinburgh Postnatal Depression Scale Screening Tool  I have been able to laugh and see the funny side of things. 0   I have looked forward with enjoyment to things. 0   I have blamed myself unnecessarily when things went wrong. 0   I have been anxious  or worried for no good reason. 0   I have felt scared or panicky for no good reason. 0   Things have been getting on top of me. 1   I have been so unhappy that I have had difficulty sleeping. 0   I have felt sad or miserable. 0   I have been so unhappy that I have been crying. 0   The thought of harming myself has occurred to me. 0   Edinburgh Postnatal Depression Scale Total 1      Data saved with a previous flowsheet row definition   No data recorded  After visit meds:  Allergies as of 12/24/2024       Reactions   Bee Venom Anaphylaxis   Ceftriaxone  Other (See Comments), Hives, Itching   Penicillins Anaphylaxis, Dermatitis, Hives, Itching   * Has tolerated several cephalosporins* Has patient had a PCN reaction causing immediate rash, facial/tongue/throat swelling, SOB or lightheadedness with hypotension: Unknown Has patient had a PCN reaction causing severe rash involving mucus membranes or skin necrosis: Unknown Has patient had a PCN reaction that required hospitalization: Unknown Has patient had a PCN reaction occurring within the last 10 years: Unknown If all of the above answers are NO, then may proceed with Cephalosporin use. Has patient had a PCN reaction causing immediate rash, facial/tongue/throat swelling, SOB or lightheadedness with hypotension: Unknown    Has patient had a PCN reaction causing severe rash involving mucus membranes or skin necrosis: Unknown    Has patient had a PCN reaction that required hospitalization: Unknown    Has patient had a PCN reaction occurring within the last 10 years: Unknown    If all of the above answers are NO, then may proceed with Cephalosporin use.    * Has tolerated several cephalosporins* Has patient had a PCN reaction causing immediate rash, facial/tongue/throat swelling, SOB or lightheadedness with hypotension: Unknown Has patient had a PCN reaction causing severe rash involving mucus membranes or skin necrosis: Unknown Has  patient had a PCN reaction that required hospitalization: Unknown Has patient had a PCN reaction occurring within the last 10 years: Unknown If all of the above answers are NO, then may proceed with Cephalosporin use.    Has tolerated cephalosporins   Lorazepam  Other (See Comments)   Aggressive-agitation   Prednisone  Other (See Comments)   Bleeding nose bleeding and internal bleeding Bleeding nose bleeding and internal bleeding    Nose bleed    Other reaction(s): Other (See Comments) weakness    Nose bleed , weakness    Unknown to pt, reconciled from outside source   Aztreonam Itching   Latex Rash     Med Rec must be completed prior to using this  SMARTLINK***        Discharge home in stable condition Infant Feeding: Breast Infant Disposition:NICU Discharge instruction: per After Visit Summary and Postpartum booklet. Activity: Advance as tolerated. Pelvic rest for 6 weeks.  Diet: routine diet Future Appointments: Future Appointments  Date Time Provider Department Center  12/26/2024  2:15 PM [facility] - [facility] IMG 2 [facility] None  12/26/2024  3:10 PM Kizzie Suzen SAUNDERS, CNM [facility]  01/02/2025  3:00 PM [facility] US  [facility] None  01/02/2025  3:50 PM Jayne Vonn DEL, MD [facility]  01/09/2025  3:00 PM [facility] US  [facility] None  01/09/2025  3:50 PM Marilynn Nest, DO [facility]  01/16/2025  3:00 PM [facility] US  [facility] None  01/16/2025  3:50 PM Kizzie Suzen SAUNDERS, CNM [facility]  01/23/2025  3:00 PM [facility] FT IMG 2 [facility] None  01/23/2025  3:50 PM Kizzie Suzen SAUNDERS, CNM [facility]   Follow up Visit:  Follow-up Information     [facility] Follow up in 1 week(s).   Specialty: Obstetrics and Gynecology Why: blood pressure check, postop check, they will call you with an appointment Contact information: [address] [place]  [address] [phone]                Msg  sent by T. Fredirick 12/24/2024 Please schedule this patient for a In person postpartum visit in 4 weeks with the following provider: Any provider. Additional Postpartum F/U:Incision check 1 week and BP check 1 week  High risk pregnancy complicated by: GDM, HTN, and seizure disorder Delivery mode:  C-Section, Low Transverse Anticipated Birth Control:  BTL done [facility]   12/24/2024 Glenys GORMAN Fredirick, MD    "

## 2024-12-24 NOTE — Op Note (Addendum)
 Preoperative Diagnosis:  IUP @ [redacted]w[redacted]d, previous high transverse C-section, CHTN with SIPE with severe features, seizure disorder, GDM A2, Undesired Fertility  Postoperative Diagnosis:  Same  Procedure: Repeat low transverse cesarean section, Bilateral Salpingectomy  Surgeon: Glenys Birk, M.D.  Assistant: Daved Maier, DO An experienced assistant was required given the standard of surgical care given the complexity of the case.  This assistant was needed for exposure, dissection, suctioning, retraction, instrument exchange, assisting with delivery with administration of fundal pressure, and for overall help during the procedure.  Anesthesia: spinal with Peggye Delon Brunswick, MD  Findings: Viable female infant, APGAR and weight pending, Normal tubes and ovaries  Estimated blood loss: 367 cc  Complications: None known  Specimens: Placenta to labor and delivery  Reason for procedure: Briefly, the patient is a 29 y.o. [id number] at [redacted]w[redacted]d with h/o previous C-section who desires permanent sterility.  Patient counseled, r.e. Risks benefits of BTL, including permanency of procedure.  Patient verbalized understanding and desires to proceed.   Procedure: Patient is taken to the OR where spinal analgesia was administered. She was then placed in a supine position with left lateral tilt. She received Gent and Clinda and TXA and SCDs were in place. A Foley catheter was placed in the bladder. She was prepped and draped in the usual sterile fashion. A timeout was performed. A knife was then used to make a Pfannenstiel incision. This incision was carried out to underlying fascia which was divided in the midline with the knife. The incision was extended laterally, bluntly. The fascia was dissected of the underlying rectus superiorly.  The rectus was divided in the midline.  The peritoneal cavity was entered bluntly.  Alexis retractor was placed inside the incision. The bladder was noted to be high on the uterus  and was taken down.  A knife was used to make a low transverse incision on the uterus. This incision was carried down to the amniotic cavity was entered. Fetus was in LOP position and was brought up out of the incision without difficulty. Delayed cord clamping x 1 minute. Cord was clamped x 2 and cut. Infant taken to waiting pediatrician.  Cord blood was obtained. Placenta was delivered from the uterus.  Uterus was cleaned with dry lap pads. Uterine incision closed with 0 Monocryl suture in a running fashion. Bleeding from the bladder take down incorporated in the uterine closure. Attention was turned to the pt's left tube which was grasped with a Babcock clamp and followed to its fimbriated end.  Two Kelly clamps used to clamp the mesosalpinx and tube with fimbriated end removed with Metzenbaum scissors. A 2-0 Monocryl on an SH was used in a Heaney type stitch beneath each Kelly clamp for hemostasis. A similar process was carried out on the right to allow for bilateral salpingectomy. Uterine incision with some oozing treated with cautery.  Alexis retractor was removed from the abdomen. Peritoneal closure was done with 0 Monocryl suture via pursestring.   Fascia is closed with 0 Vicryl suture in a running fashion. Subcutaneous tissue infused with 30cc 0.25% Marcaine .  Subcutaneous closure was performed with 0 Plain suture.  Skin closed using 3-0 Vicryl on a Keith needle.  Honeycomb dressing applied, followed by pressure dressing.  All instrument, needle and lap counts were correct x 2.  Patient was awake and taken to PACU stable.  Infant to Newborn Nursery, stable.

## 2024-12-24 NOTE — Anesthesia Preprocedure Evaluation (Addendum)
 "                                  Anesthesia Evaluation  Patient identified by MRN, date of birth, ID band Patient awake    Reviewed: Allergy & Precautions, NPO status , Patient's Chart, lab work & pertinent test results  History of Anesthesia Complications Negative for: history of anesthetic complications  Airway Mallampati: II  TM Distance: >3 FB Neck ROM: Full    Dental   Pulmonary asthma (exercise-induced, last used inhaler yesterday) , sleep apnea (non-compliant with CPAP) , former smoker   Pulmonary exam normal breath sounds clear to auscultation       Cardiovascular hypertension,  Rhythm:Regular Rate:Normal     Neuro/Psych  Headaches, Seizures - (grand mal seizure this morning per her partner's report; took Keppra  this morning and during interview), Poorly Controlled,  PSYCHIATRIC DISORDERS (ADHD, PTSD, ODD) Anxiety Depression       GI/Hepatic Neg liver ROS,GERD  ,,  Endo/Other  diabetes, Gestational, Oral Hypoglycemic Agents  Class 3 obesity  Renal/GU Renal disease (h/o stones)     Musculoskeletal   Abdominal  (+) + obese  Peds  Hematology  (+) Blood dyscrasia, anemia Lab Results      Component                Value               Date                      WBC                      15.4 (H)            12/24/2024                HGB                      11.7 (L)            12/24/2024                HCT                      35.4 (L)            12/24/2024                MCV                      85.3                12/24/2024                PLT                      239                 12/24/2024              Anesthesia Other Findings   Reproductive/Obstetrics (+) Pregnancy H/o c-section x1                              Anesthesia Physical Anesthesia Plan  ASA: 3  Anesthesia Plan: Spinal   Post-op Pain Management: Ofirmev  IV (intra-op)*   Induction:  PONV Risk Score and Plan: 2 and Ondansetron  and Treatment may  vary due to age or medical condition  Airway Management Planned: Natural Airway  Additional Equipment:   Intra-op Plan:   Post-operative Plan:   Informed Consent: I have reviewed the patients History and Physical, chart, labs and discussed the procedure including the risks, benefits and alternatives for the proposed anesthesia with the patient or authorized representative who has indicated his/her understanding and acceptance.     Dental advisory given  Plan Discussed with: CRNA and Anesthesiologist  Anesthesia Plan Comments: (Discussed spinal with patient. Patient nervous about spinal as she reports they had difficulty placing her last epidural and were unable to get it. She ended up getting IV pain medications and then had a stat c-section under GA for cord prolapse. I discussed with her that spinal anesthesia is safest for her and her baby if she was willing to let us  try that. She has had 3 other epidurals before. She agreed to spinal placement.  I have discussed risks of neuraxial anesthesia including but not limited to infection, bleeding, nerve injury, back pain, headache, seizures, and failure of block. Patient denies bleeding disorders and is not currently anticoagulated. Labs have been reviewed. Risks and benefits discussed. All patient's questions answered.  )         Anesthesia Quick Evaluation  "

## 2024-12-24 NOTE — H&P (Addendum)
 OBSTETRIC ADMISSION HISTORY AND PHYSICAL  Erin Krueger is 29 y.o. [id number] with IUP at [redacted]w[redacted]d 01/29/2025, by Last Menstrual Period presenting to MAU for ctx. She was found to have severe range bp and her partner disclosed that she had a seizure this morning. She had a HA that improved with Excedrin and Flexeril . Given these findings, her medical and obstetric histories, she was taken for repeat CS. She received her prenatal care at [facility]  ROS (+) FM, ctx, HA (-) VB, LOF. Visual changes, CP, SOB, RUQ pain, peripheral edema.  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  Rhogam  O/Positive/-- (08/28 1632) A1C/GTT Early HgbA1C: 5.5 Third trimester 2 hr GTT: abnormal  Flu Vaccine 11/07/2024       TDaP Vaccine  11/07/2024  Blood Type O/Positive/-- (08/28 1632)  RSV Vaccine   Antibody Negative (08/28 1632)  COVID Vaccine   Rubella 7.45 (08/28 1632)  Feeding Plan breast RPR Non Reactive (08/28 1632)  Contraception BTS (12/31) HBsAg Negative (08/28 1632)  Circumcision yes HIV Non Reactive (08/28 1632)  Pediatrician  [place] HCVAb Non Reactive (08/28 1632)  Prenatal Classes discussed      BTL Consent   Pap 09/12/24: neg  BTL Pre-payment   GC/CT Initial:  -/+  POC: neg 36wks:    VBAC Consent   GBS For PCN allergy, check sensitivities   BRx Optimized? [ ]  yes   [ ]  no      DME Rx [ ]  BP cuff [ ]  Weight Scale Waterbirth  [ ]  Class [ ]  Consent [ ]  CNM visit  PHQ9 & GAD7 [  ] new OB [  ] 28 weeks  [  ] 36 weeks Induction  [ ]  Orders Entered [ ] Foley Y/N   OB History  Gravida Para Term Preterm AB Living  12 6 4 2 5 5   SAB IAB Ectopic Multiple Live Births  5   0 5    #  Outcome Date GA Lbr Len/2nd Weight Sex Type Anes PTL Lv  12 Current           11 SAB 08/2024          10 SAB 03/2024          9 Term 01/09/23 [redacted]w[redacted]d  3033 g F CS-LTranv  N LIV     Complications: Umbilical cord prolapse  8 Preterm 01/24/22 [redacted]w[redacted]d 14:40 / 00:12 2930 g F Vag-Spont EPI  LIV  7 SAB 2022             Birth Comments: states didn't go to doctor, had positive home  pregnancy test , then bled and passed tissue- didn't go to appt because of history of 2 sab  6 Preterm 12/28/19 [redacted]w[redacted]d 08:08 / 00:05 2790 g M Vag-Spont EPI  LIV     Complications: Eclampsia  5 Term 11/08/18 [redacted]w[redacted]d 00:15 / 00:18 3275 g M Vag-Spont EPI  LIV     Birth  Comments: WNL  4 Term 06/23/17 [redacted]w[redacted]d  2948 g F Vag-Spont EPI N LIV     Birth Comments: baby had cardiac defect, otherwise wnl  3 SAB 07/2014 [redacted]w[redacted]d            Birth Comments: had D&C  2 Term 2014 [redacted]w[redacted]d       FD     Birth Comments: stillbirth  1 SAB 2014 [redacted]w[redacted]d          Patient Active Problem List   Diagnosis Date Noted   Gestational diabetes 11/27/2024   Depression with anxiety, PTSD, ADHD 09/12/2024   Chlamydia 09/12/2024   History of cesarean delivery 07/21/2024   Kidney stone    Supervision of high risk pregnancy, antepartum 07/19/2024   Asymptomatic bacteriuria during pregnancy 06/05/2024   Miscarriage 10/19/2023   Adopted 08/10/2022   History of pre-eclampsia in prior pregnancy, currently pregnant 06/17/2022   History of eclampsia 06/17/2022   Short interval between pregnancies affecting pregnancy, antepartum 06/17/2022   History of stillbirth 06/17/2022   Obesity in pregnancy 06/17/2022   Unspecified mood (affective) disorder 03/26/2022   Chronic hypertension affecting pregnancy 12/01/2021   Seizure disorder during pregnancy, antepartum (HCC) 12/01/2021   Urinary tract infection in mother during second trimester of pregnancy 10/08/2021   H/o child with congenital heart defect 04/05/2018   Chronic asthma without complication 09/12/2017    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 60 tablet 1 12/24/2024   ALBUTEROL  SULFATE IN Inhale into the lungs.   Past Week   aspirin  EC 81 MG tablet Take 1 tablet (81 mg total) by mouth daily. 30 tablet 6 12/24/2024   cyclobenzaprine  (FLEXERIL ) 10 MG tablet Take 1 tablet (10 mg total) by mouth 3 (three) times daily as needed for muscle spasms. 60 tablet 2 12/23/2024   lamoTRIgine  (LAMICTAL ) 100 MG tablet TAKE 1 TABLET BY MOUTH TWICE A DAY 60 tablet 6 12/24/2024   levETIRAcetam  (KEPPRA ) 750 MG tablet TAKE 2 TABLETS (1,500 MG TOTAL) BY MOUTH 2 (TWO) TIMES DAILY. 360 tablet 1 12/24/2024   metFORMIN  (GLUCOPHAGE ) 500 MG tablet Take 1,000mg  (2 tabs) in the morning and 500mg  (1 tab) at bedtime 90 tablet 3 12/24/2024   nitrofurantoin , macrocrystal-monohydrate, (MACROBID ) 100 MG capsule Take 1 capsule (100 mg total) by mouth at bedtime. After you are finished w/ bactrim  rx 30 capsule 3 12/23/2024   ondansetron  (ZOFRAN -ODT) 4 MG disintegrating tablet Take 1 tablet (4 mg total) by mouth every 6 (six) hours as needed for nausea. 30 tablet 2 12/23/2024   Prenatal Vit-Fe Fumarate-FA (PRENATAL VITAMIN PO) Take 1 capsule by mouth daily.   12/24/2024   venlafaxine  XR (EFFEXOR -XR) 75 MG 24 hr capsule Take 75 mg by mouth daily.   12/24/2024   Accu-Chek Softclix Lancets lancets Check blood sugar four times daily. 100 each 12    Blood Glucose Monitoring Suppl (ACCU-CHEK GUIDE ME) w/Device KIT 1 kit by Does not apply route as directed. 1 kit 0    Blood Pressure Monitor MISC For regular home bp monitoring during pregnancy 1 each 0    EPINEPHrine  0.3  mg/0.3 mL IJ SOAJ injection Inject 0.3 mg into the muscle  as needed for anaphylaxis. 1 each 2    glucose blood (ACCU-CHEK GUIDE TEST) test strip Check Blood sugar four times a day. 100 each 12     Past Medical History: Past Medical History:  Diagnosis Date   ADHD (attention deficit hyperactivity disorder)    Adopted person 10/04/2019   Does not know personal family history other than Diabetes, Breast Cancer. Raised in foster care   Allergy    Anxiety    Asthma    Epilepsy (HCC)    Headache(784.0)    History of eclampsia    History of pre-eclampsia    Kidney stone    kidney stones   Migraines    ODD (oppositional defiant disorder)    PID (acute pelvic inflammatory disease) 01/29/2016   PTSD (post-traumatic stress disorder)    Past Surgical History: Past Surgical History:  Procedure Laterality Date   CESAREAN SECTION  01/09/2023   DILATION AND CURETTAGE OF UTERUS     kidney stone removed  03/06/2024   right knee surgery     stent in left kidney  02/20/2024   TONSILLECTOMY AND ADENOIDECTOMY     Social History Social History   Socioeconomic History   Marital status: Married    Spouse name: Not on file   Number of children: 2   Years of education: Not on file   Highest education level: 11th grade  Occupational History   Occupation: disabled has epilepsy   Tobacco Use   Smoking status: Former    Current packs/day: 0.00    Average packs/day: 0.5 packs/day    Types: Cigarettes, E-cigarettes    Quit date: 04/30/2021    Years since quitting: 3.6   Smokeless tobacco: Never   Tobacco comments:    nicotine  patch  Vaping Use   Vaping status: Former   Substances: CBD  Substance and Sexual Activity   Alcohol use: Not Currently    Comment: drinks beer on weekends when not pregnant   Drug use: Not Currently    Types: Marijuana, Cocaine    Comment: Not since 2018   Sexual activity: Yes    Birth control/protection: None  Other Topics Concern   Not on file  Social History  Narrative   Not on file   Social Drivers of Health   Tobacco Use: Medium Risk (12/24/2024)   Patient History    Smoking Tobacco Use: Former    Smokeless Tobacco Use: Never    Passive Exposure: Not on file  Financial Resource Strain: Low Risk (10/05/2024)   Received from [facility]   Overall Financial Resource Strain (CARDIA)    How hard is it for you to pay for the very basics like food, housing, medical care, and heating?: Not very hard  Food Insecurity: Patient Declined (12/24/2024)   Epic    Worried About Programme Researcher, Broadcasting/film/video in the Last Year: Patient declined    Barista in the Last Year: Patient declined  Transportation Needs: Patient Declined (12/24/2024)   Epic    Lack of Transportation (Medical): Patient declined    Lack of Transportation (Non-Medical): Patient declined  Physical Activity: Inactive (07/20/2024)   Exercise Vital Sign    Days of Exercise per Week: 0 days    Minutes of Exercise per Session: 0 min  Stress: Stress Concern Present (07/20/2024)   [employer] of Occupational Health - Occupational Stress Questionnaire    Feeling of Stress: Rather much  Social Connections: Patient Declined (12/24/2024)   Social Connection and  Isolation Panel    Frequency of Communication with Friends and Family: Patient declined    Frequency of Social Gatherings with Friends and Family: Patient declined    Attends Religious Services: Patient declined    Active Member of Clubs or Organizations: Patient declined    Attends Banker Meetings: Patient declined    Marital Status: Patient declined  Depression (PHQ2-9): Low Risk (07/20/2024)   Depression (PHQ2-9)    PHQ-2 Score: 0  Alcohol Screen: Low Risk (07/20/2024)   Alcohol Screen    Last Alcohol Screening Score (AUDIT): 0  Housing: Patient Declined (12/24/2024)   Epic    Unable to Pay for Housing in the Last Year: Patient declined    Number of Times Moved in the Last Year: Not on file    Homeless in the Last  Year: Patient declined  Utilities: Patient Declined (12/24/2024)   Epic    Threatened with loss of utilities: Patient declined  Health Literacy: Not on file   Family History: Family History  Adopted: Yes  Problem Relation Age of Onset   Diabetes Sister    Other Daughter        born with hole in heart   Other Son        born with hole in heart   Breast cancer Maternal Grandmother    Diabetes Maternal Grandmother    Glaucoma Maternal Grandmother    Epilepsy Maternal Grandmother     Review of Systems  All systems reviewed and negative except as stated in HPI   PHYSICAL EXAM Blood pressure (!) 171/96, pulse (!) 112, temperature 97.9 F (36.6 C), temperature source Oral, resp. rate 16, height 5' 6 (1.676 m), weight 115.7 kg, last menstrual period 04/24/2024, SpO2 95%, unknown if currently breastfeeding. General appearance: alert and cooperative Lungs: respirations nonlabored Heart: mild tachycardia Abdomen: gravid  Fetal monitoringBaseline: 140 bpm, Variability: Good {> 6 bpm), Accelerations: 15x15 only once in last , and Decelerations: Absent Uterine activityNone  Dilation: Fingertip Effacement (%): Thick Station: -4 Surveyor, Minerals) Exam by:: Rocky Satterfield, NP   Prenatal labs: ABO, Rh: O/Positive/-- (08/28 1632) Antibody: Negative (01/02 0925) Rubella: 7.45 (08/28 1632) RPR: Non Reactive (01/02 0925)  HBsAg: Negative (08/28 1632)  HIV: Non Reactive (01/02 0925)   Anatomy US : normal at 20+1  Immunization History  Administered Date(s) Administered   Influenza, Seasonal, Injecte, Preservative Fre 11/07/2024   Influenza,inj,Quad PF,6+ Mos 08/15/2018   Influenza-Unspecified 06/27/2019, 09/17/2021, 09/30/2022, 07/26/2023   Tdap 08/15/2018, 11/07/2019, 12/09/2021, 12/01/2022, 11/07/2024    Results for orders placed or performed during the hospital encounter of 12/24/24 (from the past 24 hours)  Protein / creatinine ratio, urine   Collection Time: 12/24/24  6:54 PM   Result Value Ref Range   Creatinine, Urine 204 mg/dL   Total Protein, Urine 46 mg/dL   Protein Creatinine Ratio 0.2 (H) <0.2 mg/mg  Urinalysis, Routine w reflex microscopic -Urine, Clean Catch   Collection Time: 12/24/24  6:54 PM  Result Value Ref Range   Color, Urine YELLOW YELLOW   APPearance HAZY (A) CLEAR   Specific Gravity, Urine 1.024 1.005 - 1.030   pH 6.0 5.0 - 8.0   Glucose, UA NEGATIVE NEGATIVE mg/dL   Hgb urine dipstick SMALL (A) NEGATIVE   Bilirubin Urine NEGATIVE NEGATIVE   Ketones, ur 5 (A) NEGATIVE mg/dL   Protein, ur 30 (A) NEGATIVE mg/dL   Nitrite NEGATIVE NEGATIVE   Leukocytes,Ua TRACE (A) NEGATIVE   RBC / HPF >50 0 - 5 RBC/hpf  WBC, UA 11-20 0 - 5 WBC/hpf   Bacteria, UA RARE (A) NONE SEEN   Squamous Epithelial / HPF 0-5 0 - 5 /HPF   Mucus PRESENT   CBC   Collection Time: 12/24/24  7:15 PM  Result Value Ref Range   WBC 15.4 (H) 4.0 - 10.5 K/uL   RBC 4.15 3.87 - 5.11 MIL/uL   Hemoglobin 11.7 (L) 12.0 - 15.0 g/dL   HCT 64.5 (L) 63.9 - 53.9 %   MCV 85.3 80.0 - 100.0 fL   MCH 28.2 26.0 - 34.0 pg   MCHC 33.1 30.0 - 36.0 g/dL   RDW 86.1 88.4 - 84.4 %   Platelets 239 150 - 400 K/uL   nRBC 0.0 0.0 - 0.2 %  Comprehensive metabolic panel with GFR   Collection Time: 12/24/24  7:15 PM  Result Value Ref Range   Sodium 135 135 - 145 mmol/L   Potassium 3.9 3.5 - 5.1 mmol/L   Chloride 104 98 - 111 mmol/L   CO2 16 (L) 22 - 32 mmol/L   Glucose, Bld 98 70 - 99 mg/dL   BUN 7 6 - 20 mg/dL   Creatinine, Ser 9.43 0.44 - 1.00 mg/dL   Calcium  7.9 (L) 8.9 - 10.3 mg/dL   Total Protein 6.8 6.5 - 8.1 g/dL   Albumin 3.4 (L) 3.5 - 5.0 g/dL   AST 17 15 - 41 U/L   ALT 9 0 - 44 U/L   Alkaline Phosphatase 134 (H) 38 - 126 U/L   Total Bilirubin 0.4 0.0 - 1.2 mg/dL   GFR, Estimated >39 >39 mL/min   Anion gap 15 5 - 15    Patient Active Problem List   Diagnosis Date Noted   Gestational diabetes 11/27/2024   Depression with anxiety, PTSD, ADHD 09/12/2024   Chlamydia  09/12/2024   History of cesarean delivery 07/21/2024   Kidney stone    Supervision of high risk pregnancy, antepartum 07/19/2024   Asymptomatic bacteriuria during pregnancy 06/05/2024   Miscarriage 10/19/2023   Adopted 08/10/2022   History of pre-eclampsia in prior pregnancy, currently pregnant 06/17/2022   History of eclampsia 06/17/2022   Short interval between pregnancies affecting pregnancy, antepartum 06/17/2022   History of stillbirth 06/17/2022   Obesity in pregnancy 06/17/2022   Unspecified mood (affective) disorder 03/26/2022   Chronic hypertension affecting pregnancy 12/01/2021   Seizure disorder during pregnancy, antepartum (HCC) 12/01/2021   Urinary tract infection in mother during second trimester of pregnancy 10/08/2021   H/o child with congenital heart defect 04/05/2018   Chronic asthma without complication 09/12/2017   Psychogenic nonepileptic seizure 11/18/2016   PTSD (post-traumatic stress disorder) 05/12/2013   ADHD (attention deficit hyperactivity disorder), combined type 05/12/2013    Prenatal Transfer Tool  Maternal Diabetes: Yes:  Diabetes Type:  Insulin/Medication controlled Genetic Screening: Normal Maternal Ultrasounds/Referrals: Normal Fetal Ultrasounds or other Referrals:  None Maternal Substance Abuse:  No Significant Maternal Medications:  Meds include: Other: Keppra , Lamictal , Venlafaxine  Significant Maternal Lab Results: Other: GBS unknown this pregnancy Number of Prenatal Visits:greater than 3 verified prenatal visits Maternal Vaccinations:TDap and Flu Other Comments:  significant maternal PMH and obstetric hx   ASSESSMENT & PLAN MELIYAH SIMON is 29 y.o. [id number] with IUP at [redacted]w[redacted]d 01/29/2025, by Last Menstrual Period admitted for repeat CS and BTL.  Sono at 32+1: normal anatomy, cephalic presentation, anterior placenta, EFW 2122g (70%)  #FWB: Cat II  #cHTN  #h/o preE #h/o eclampsia No meds for bp; WNL at P[facility], pt reports bp  140s/80s at  home.  - Questionable severe preE today based on bp. Severe range today in MAU however intermittently normal. P/C 0.2.  #undesired fertility - BTL today intraoperatively  - papers signed 11/22/2024  #psychogenic nonepileptic seizure - continue home Keppra  and Lamictal   #gDMa2 - stop metformin  after delivery  #asx bacteruria - stop macrobid  at delivery  #chlmaydia in first trimester - TOC negative  #GBS status:  [place] #Feeding: Breastmilk  #Reproductive Life planning: Tubal Ligation #Circ:  yes   Barabara Maier, DO FM-OB Fellow [facility] for [employer]

## 2024-12-24 NOTE — Transfer of Care (Signed)
 Immediate Anesthesia Transfer of Care Note  Patient: Erin Krueger  Procedure(s) Performed: CESAREAN SECTION, WITH BILATERAL TUBAL LIGATION  Patient Location: PACU  Anesthesia Type:Spinal  Level of Consciousness: awake, alert , and oriented  Airway & Oxygen  Therapy: Patient Spontanous Breathing  Post-op Assessment: Report given to RN and Post -op Vital signs reviewed and stable  Post vital signs: Reviewed and stable  Last Vitals:  Vitals Value Taken Time  BP 93/62 12/24/24 22:50  Temp    Pulse 89 12/24/24 22:56  Resp 15 12/24/24 22:56  SpO2 99 % 12/24/24 22:56  Vitals shown include unfiled device data.  Last Pain:  Vitals:   12/24/24 2004  TempSrc:   PainSc: 6          Complications: No notable events documented.
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
## 2024-12-25 ENCOUNTER — Encounter (HOSPITAL_COMMUNITY): Payer: Self-pay | Admitting: Family Medicine

## 2024-12-25 LAB — GLUCOSE, CAPILLARY: Glucose-Capillary: 98 mg/dL (ref 70–99)

## 2024-12-25 LAB — CBC
HCT: 29.5 % — ABNORMAL LOW (ref 36.0–46.0)
Hemoglobin: 9.7 g/dL — ABNORMAL LOW (ref 12.0–15.0)
MCH: 28.1 pg (ref 26.0–34.0)
MCHC: 32.9 g/dL (ref 30.0–36.0)
MCV: 85.5 fL (ref 80.0–100.0)
Platelets: 191 10*3/uL (ref 150–400)
RBC: 3.45 MIL/uL — ABNORMAL LOW (ref 3.87–5.11)
RDW: 13.8 % (ref 11.5–15.5)
WBC: 14.3 10*3/uL — ABNORMAL HIGH (ref 4.0–10.5)
nRBC: 0 % (ref 0.0–0.2)

## 2024-12-25 LAB — SYPHILIS: RPR W/REFLEX TO RPR TITER AND TREPONEMAL ANTIBODIES, TRADITIONAL SCREENING AND DIAGNOSIS ALGORITHM: RPR Ser Ql: NONREACTIVE

## 2024-12-25 MED ORDER — ENOXAPARIN SODIUM 60 MG/0.6ML IJ SOSY: 60.0000 mg | PREFILLED_SYRINGE | INTRAMUSCULAR | Status: DC

## 2024-12-25 MED ORDER — HYDROMORPHONE HCL 1 MG/ML IJ SOLN: 0.2000 mg | INTRAMUSCULAR | Status: DC | PRN

## 2024-12-25 MED ORDER — GABAPENTIN 100 MG PO CAPS
100.0000 mg | ORAL_CAPSULE | Freq: Two times a day (BID) | ORAL | Status: DC
  Administered 2024-12-25 – 2024-12-26 (×4): 100 mg via ORAL
  Filled 2024-12-25 (×4): qty 1

## 2024-12-25 MED ORDER — MAGNESIUM SULFATE BOLUS VIA INFUSION
4.0000 g | Freq: Once | INTRAVENOUS | Status: AC
  Administered 2024-12-25: 4 g via INTRAVENOUS
  Filled 2024-12-25: qty 1000

## 2024-12-25 MED ORDER — LABETALOL HCL 5 MG/ML IV SOLN: 20.0000 mg | INTRAVENOUS | Status: DC | PRN

## 2024-12-25 MED ORDER — ACETAMINOPHEN 500 MG PO TABS
1000.0000 mg | ORAL_TABLET | Freq: Four times a day (QID) | ORAL | Status: DC
  Administered 2024-12-25 – 2024-12-26 (×5): 1000 mg via ORAL
  Filled 2024-12-25 (×7): qty 2

## 2024-12-25 MED ORDER — ZOLPIDEM TARTRATE 5 MG PO TABS: 5.0000 mg | ORAL_TABLET | Freq: Every evening | ORAL | Status: DC | PRN

## 2024-12-25 MED ORDER — NIFEDIPINE ER OSMOTIC RELEASE 30 MG PO TB24
30.0000 mg | ORAL_TABLET | Freq: Every day | ORAL | Status: DC
  Administered 2024-12-25 – 2024-12-26 (×2): 30 mg via ORAL
  Filled 2024-12-25 (×2): qty 1

## 2024-12-25 MED ORDER — WITCH HAZEL-GLYCERIN EX PADS: 1.0000 | MEDICATED_PAD | CUTANEOUS | Status: DC | PRN

## 2024-12-25 MED ORDER — MAGNESIUM SULFATE 40 GM/1000ML IV SOLN
2.0000 g/h | INTRAVENOUS | Status: AC
  Filled 2024-12-25: qty 1000

## 2024-12-25 MED ORDER — KETOROLAC TROMETHAMINE 30 MG/ML IJ SOLN
30.0000 mg | Freq: Four times a day (QID) | INTRAMUSCULAR | Status: AC
  Administered 2024-12-25 (×4): 30 mg via INTRAVENOUS
  Filled 2024-12-25 (×4): qty 1

## 2024-12-25 MED ORDER — LACTATED RINGERS IV SOLN: INTRAVENOUS | Status: AC

## 2024-12-25 MED ORDER — OXYCODONE HCL 5 MG PO TABS
5.0000 mg | ORAL_TABLET | ORAL | Status: DC | PRN
  Administered 2024-12-25 – 2024-12-26 (×5): 10 mg via ORAL
  Filled 2024-12-25 (×5): qty 2

## 2024-12-25 MED ORDER — MEASLES, MUMPS & RUBELLA VAC ~~LOC~~ SUSR: 0.5000 mL | Freq: Once | SUBCUTANEOUS | Status: DC

## 2024-12-25 MED ORDER — HYDRALAZINE HCL 20 MG/ML IJ SOLN: 10.0000 mg | INTRAMUSCULAR | Status: DC | PRN

## 2024-12-25 MED ORDER — SIMETHICONE 80 MG PO CHEW: 80.0000 mg | CHEWABLE_TABLET | ORAL | Status: DC | PRN

## 2024-12-25 MED ORDER — SENNOSIDES-DOCUSATE SODIUM 8.6-50 MG PO TABS
2.0000 | ORAL_TABLET | Freq: Every day | ORAL | Status: DC
  Administered 2024-12-25 – 2024-12-26 (×2): 2 via ORAL
  Filled 2024-12-25 (×2): qty 2

## 2024-12-25 MED ORDER — PRENATAL MULTIVITAMIN CH
1.0000 | ORAL_TABLET | Freq: Every day | ORAL | Status: DC
  Administered 2024-12-25 – 2024-12-26 (×2): 1 via ORAL
  Filled 2024-12-25 (×2): qty 1

## 2024-12-25 MED ORDER — IBUPROFEN 600 MG PO TABS
600.0000 mg | ORAL_TABLET | Freq: Four times a day (QID) | ORAL | Status: DC
  Administered 2024-12-26 (×2): 600 mg via ORAL
  Filled 2024-12-25 (×2): qty 1

## 2024-12-25 MED ORDER — SIMETHICONE 80 MG PO CHEW
80.0000 mg | CHEWABLE_TABLET | Freq: Three times a day (TID) | ORAL | Status: DC
  Administered 2024-12-25 – 2024-12-26 (×6): 80 mg via ORAL
  Filled 2024-12-25 (×7): qty 1

## 2024-12-25 MED ORDER — LABETALOL HCL 5 MG/ML IV SOLN: 80.0000 mg | INTRAVENOUS | Status: DC | PRN

## 2024-12-25 MED ORDER — OXYTOCIN-SODIUM CHLORIDE 30-0.9 UT/500ML-% IV SOLN: 2.5000 [IU]/h | INTRAVENOUS | Status: AC

## 2024-12-25 MED ORDER — MENTHOL 3 MG MT LOZG: 1.0000 | LOZENGE | OROMUCOSAL | Status: DC | PRN

## 2024-12-25 MED ORDER — COCONUT OIL OIL: 1.0000 | TOPICAL_OIL | Status: DC | PRN

## 2024-12-25 MED ORDER — ENOXAPARIN SODIUM 40 MG/0.4ML IJ SOSY: 40.0000 mg | PREFILLED_SYRINGE | INTRAMUSCULAR | Status: DC

## 2024-12-25 MED ORDER — DIBUCAINE (PERIANAL) 1 % EX OINT: 1.0000 | TOPICAL_OINTMENT | CUTANEOUS | Status: DC | PRN

## 2024-12-25 MED ORDER — LABETALOL HCL 5 MG/ML IV SOLN: 40.0000 mg | INTRAVENOUS | Status: DC | PRN

## 2024-12-25 NOTE — Anesthesia Postprocedure Evaluation (Signed)
"   Anesthesia Post Note  Patient: Erin Krueger  Procedure(s) Performed: CESAREAN SECTION, WITH BILATERAL TUBAL LIGATION     Patient location during evaluation: PACU Anesthesia Type: Spinal Level of consciousness: awake Pain management: pain level controlled Vital Signs Assessment: post-procedure vital signs reviewed and stable Respiratory status: spontaneous breathing, respiratory function stable and nonlabored ventilation Cardiovascular status: blood pressure returned to baseline and stable Postop Assessment: no headache, no backache and no apparent nausea or vomiting Anesthetic complications: no   No notable events documented.  Last Vitals:  Vitals:   12/25/24 0029 12/25/24 0150  BP: (!) 100/58 (!) 93/55  Pulse: 81 91  Resp: 19 19  Temp: 36.5 C   SpO2: 99%     Last Pain:  Vitals:   12/25/24 0158  TempSrc:   PainSc: 7                  [name]      "

## 2024-12-26 ENCOUNTER — Other Ambulatory Visit (HOSPITAL_COMMUNITY): Payer: Self-pay

## 2024-12-26 ENCOUNTER — Other Ambulatory Visit: Admitting: Radiology

## 2024-12-26 ENCOUNTER — Encounter: Admitting: Women's Health

## 2024-12-26 LAB — GLUCOSE, CAPILLARY
Glucose-Capillary: 78 mg/dL (ref 70–99)
Glucose-Capillary: 88 mg/dL (ref 70–99)

## 2024-12-26 LAB — SURGICAL PATHOLOGY

## 2024-12-26 MED ORDER — OXYCODONE HCL 5 MG PO TABS
5.0000 mg | ORAL_TABLET | ORAL | 0 refills | Status: AC | PRN
  Filled 2024-12-26: qty 20, 2d supply, fill #0

## 2024-12-26 MED ORDER — NIFEDIPINE ER 30 MG PO TB24
30.0000 mg | ORAL_TABLET | Freq: Every day | ORAL | 0 refills | Status: DC
  Filled 2024-12-26: qty 90, 90d supply, fill #0

## 2024-12-26 MED ORDER — IBUPROFEN 600 MG PO TABS
600.0000 mg | ORAL_TABLET | Freq: Four times a day (QID) | ORAL | 0 refills | Status: DC
  Filled 2024-12-26: qty 30, 8d supply, fill #0

## 2024-12-26 NOTE — Progress Notes (Signed)
" °   12/26/24 1443  Departure Condition  Departure Condition Good  Mobility at [facility]  Patient/Caregiver Teaching Teach Back Method Used;Discharge instructions reviewed;Prescriptions reviewed;Follow-up care reviewed;Admission discussed;Pain management discussed;Medications discussed;Patient/caregiver verbalized understanding;Educated about hypertension in pregnancy  Departure Mode With significant other   Pt alert and oriented x4, vital signs and pain stable at time of discharge. Discharge instructions and medications discussed, pt verbalized understanding. "

## 2024-12-27 ENCOUNTER — Inpatient Hospital Stay (HOSPITAL_COMMUNITY)
Admission: AD | Admit: 2024-12-27 | Discharge: 2024-12-28 | Disposition: A | Source: Home / Self Care | Attending: Obstetrics and Gynecology | Admitting: Obstetrics and Gynecology

## 2024-12-27 DIAGNOSIS — R7401 Elevation of levels of liver transaminase levels: Secondary | ICD-10-CM | POA: Diagnosis present

## 2024-12-27 DIAGNOSIS — O165 Unspecified maternal hypertension, complicating the puerperium: Secondary | ICD-10-CM

## 2024-12-27 LAB — CBC
HCT: 30.6 % — ABNORMAL LOW (ref 36.0–46.0)
Hemoglobin: 9.9 g/dL — ABNORMAL LOW (ref 12.0–15.0)
MCH: 28.2 pg (ref 26.0–34.0)
MCHC: 32.4 g/dL (ref 30.0–36.0)
MCV: 87.2 fL (ref 80.0–100.0)
Platelets: 244 10*3/uL (ref 150–400)
RBC: 3.51 MIL/uL — ABNORMAL LOW (ref 3.87–5.11)
RDW: 14.1 % (ref 11.5–15.5)
WBC: 10.4 10*3/uL (ref 4.0–10.5)
nRBC: 0.2 % (ref 0.0–0.2)

## 2024-12-27 MED ORDER — FUROSEMIDE 40 MG PO TABS
40.0000 mg | ORAL_TABLET | Freq: Once | ORAL | Status: AC
  Administered 2024-12-28: 40 mg via ORAL
  Filled 2024-12-27: qty 1

## 2024-12-27 MED ORDER — NIFEDIPINE ER OSMOTIC RELEASE 30 MG PO TB24
30.0000 mg | ORAL_TABLET | Freq: Once | ORAL | Status: AC
  Administered 2024-12-27: 30 mg via ORAL
  Filled 2024-12-27: qty 1

## 2024-12-27 MED ORDER — BUTALBITAL-APAP-CAFFEINE 50-325-40 MG PO TABS
1.0000 | ORAL_TABLET | Freq: Once | ORAL | Status: AC
  Administered 2024-12-27: 1 via ORAL
  Filled 2024-12-27: qty 1

## 2024-12-27 MED ORDER — POTASSIUM CHLORIDE CRYS ER 20 MEQ PO TBCR: 20.0000 meq | EXTENDED_RELEASE_TABLET | Freq: Every day | ORAL | Status: DC

## 2024-12-27 MED ORDER — POTASSIUM CHLORIDE CRYS ER 20 MEQ PO TBCR: 20.0000 meq | EXTENDED_RELEASE_TABLET | Freq: Once | ORAL | Status: DC

## 2024-12-27 NOTE — Clinical Social Work Maternal (Signed)
 " CLINICAL SOCIAL WORK MATERNAL/CHILD NOTE  Patient Details  Name: Erin Krueger MRN: [id number] Date of Birth: 1996/11/19  Date:  12/27/2024  Clinical Social Worker Initiating Note:  Nat Quiet, [place] Date/Time: Initiated:  12/27/24/1026     Child's Name:  Erin Krueger   Biological Parents:  Mother, Father (Mother: Erin Krueger 08/03/97, Father: Erin Krueger 11/15/1998)   Need for Interpreter:  None   Reason for Referral:  Parental Support of Premature Babies < 32 weeks/or Critically Ill babies   Address:  [address] [place] [address]    Phone number:  [phone] (home)     Additional phone number:   Household Members/Support Persons (HM/SP):   Household Member/Support Person 1, Household Member/Support Person 2   HM/SP Name Relationship DOB or Age  HM/SP -1 Erin Krueger Daughter 01/09/2023  HM/SP -2 Erin Krueger Spouse 11/15/1998  HM/SP -3        HM/SP -4        HM/SP -5        HM/SP -6        HM/SP -7        HM/SP -8          Natural Supports (not living in the home):  Immediate Family, Extended Family, Parent, Teacher, Music Supports: Case Research Officer, Political Party, Paramedic, Other (Comment) Tax Inspector)   Employment: Unemployed   Type of Work:     Education:  9 to 11 years   Homebound arranged: No  Financial Resources:  Medicaid   Other Resources:      Cultural/Religious Considerations Which May Impact Care:    Strengths:  Ability to meet basic needs  , Home prepared for child  , Psychotropic Medications, Pediatrician chosen   Psychotropic Medications:  Trazodone , Effexor , Lamictal , Other meds      Pediatrician:     [facility])  Pediatrician List:   [facility] [place]    [place]    [place] [facility]      Pediatrician Fax Number:    Risk Factors/Current Problems:  Mental Health Concerns     Cognitive State:  Able to Concentrate   , Alert     Mood/Affect:  Calm  , Comfortable  , Interested     CSW Assessment: CSW received consult for NICU admission.  CSW met with MOB to offer support and complete assessment.    CSW met with MOB at bedside and introduced CSW role. MOB was resting in bed, and FOB was asleep on the couch. MOB was easily awakened and agreed to complete an assessment with CSW. MOB appeared calm, pleasant and engaged with CSW during the visit. CSW congratulated MOB on the birth of her baby boy, Erin. CSW offered privacy and MOB gave CSW permission to share all information with FOB present.  MOB confirmed that the demographic information on hospital file was correct and stated that she resides with her two-year-old daughter, Erin Krueger and spouse, Erin Krueger. CSW inquired about the whereabouts of MOB's other children. MOB reported that her oldest child (06/23/2017) was placed up for adoption. Her son Erin Krueger (11/08/2018) and daughter Erin Krueger (02/24/2022) were removed from her custody in 2023 by [facility] CPS and placed in the custody of their maternal uncle Erin Krueger) due to her mental health issues and housing instability at the time. MOB reported that her son Erin Krueger (12/28/2019) was placed in the custody  of his paternal grandmother at the time. MOB reported that she continues visit with her children on a regular basis. CSW inquired if MOB had any current involvement with CPS. Mob denied current CPS involvement and denied having any open cases. 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 MOB shared pictures of her current living space, and the essential baby items that she purchased for her son including a crib and a bassinet. CSW thanked MOB for sharing. MOB reported that she is currently unemployed, and FOB is employed at [employer]. MOB reported that she receives both [facility] and SNAP benefits. CSW inquired about MOB's supports system. MOB  identified her spouse, sister Erin Krueger, her father, and brother Erin Krueger and FOB's family as her primary sources of support. CSW encouraged MOB to continue to rely on her support system.   CSW inquired how MOB had been feeling since giving birth. MOB reported that the birth went great and stated that the medical team made her feel very comfortable. She shared that she has been struggling mentally related to her infant's NICU admission and reached out to her therapist for additional support. CSW validated MOB's feelings and ability to utilize her professional supports. MOB reported that she will continue weekly session with her therapist Erin Krueger, at [place], whom she has been seeing for the past three years. CSW inquired about MOB's noted mental health history. MOB reported that she has anxiety, depression, PTSD and seizure disorder. MOB reported that she sees psychiatrist Erin Krueger at [facility] who prescribes Keppra , Lamictal  to treat seizures and depression. She also reported taking Effexor  for her anxiety and plans to resume trazadone for sleep. MOB reported the combination of medication and therapy work well for her. CSW inquired if MOB previously experienced depression and anxiety. MOB denied previously experiencing postpartum depression and anxiety. CSW provided education regarding the baby blues period vs. perinatal mood disorders, and affirmed her current treatment plan. CSW recommended MOB complete a self-evaluation during the postpartum time period using the New Mom Checklist from Postpartum Progress and encouraged MOB to contact a medical professional if symptoms are noted at any time. CSW assessed MOB for safety. MOB denied SI/HI.   CSW inquired if the NICU staff had been keeping her and FOB well informed about her infant's care. MOB reported that she felt well informed and had been oriented to the NICU. MOB inquired about support groups for NICU parents. CSW provided education about Family  Support Network (FSN) and offered to have a member of FSN follow up with her. MOB expressed appreciation for the support. CSW offered to check in with MOB while the infant remains in NICU. MOB was receptive to visits from CSW.  MOB shared that she also has additional community support through [facility] pregnancy care team and [facility] Department pregnancy coordinator Tinnie who will continue to provide support during the postpartum period.   CSW inquired if MOB had chosen a pediatrician for her infant. MOB reported [facility] [place] and confirmed transportation.  CSW assessed MOB for additional needs. MOB reported none.   CSW Plan/Description:  CSW will continue to offer support to family while the infant remains in NICU. CSW reviewed Perinatal Mood and Anxiety Disorder (PMADs) Education,  Other Information/Referral to [facility] No Further Intervention Required/No Barriers to Discharge,   Nat DELENA Quiet, LCSW 12/27/2024, 10:33 AM  "

## 2024-12-27 NOTE — MAU Note (Signed)
..  Erin Krueger is a 29 y.o. at [redacted]w[redacted]d here in MAU reporting: swelling in hands and feet, blurred vision, pain under left breast, headache, and dizziness that began an hour ago.  Has CHTN, took nifedipine  this morning.  Pain score: headache 6/10 Vitals:   12/27/24 2215  BP: (!) 138/93  Pulse: (!) 104  Resp: 19  Temp: 98.6 F (37 C)  SpO2: 99%     FHT:n/a Lab orders placed from triage:  none

## 2024-12-27 NOTE — MAU Provider Note (Signed)
 Obstetrics & Gynecology MAU Note  Date of Visit: 12/27/2024   Primary OBGYN: [facility] Primary Care Provider: [facility] [place], [place].  Chief Complaint: b/l hand and feet swelling, discomfort under left breast and headache  History of Present Illness: Erin Krueger is a 29 y.o. [id number] POD#3 s/p RLTCS and BS at 34/6 after being diagnosed with severe pre-e based on BPs and neuro s/s. Hx significant for CHTN, seizure d/o, GDMA2, BMI 40s  Patient up in the NICU with her child and noticed the swelling s/s and started having a mild HA not helped with motrin  as well as the left under breast discomfort so came downstairs for evaluation. She states she took her procardia  this morning as well as her other meds.   Continues to meet post op goals. No chest pain, sob  ROS: A 12-point review of systems was performed and negative, except as stated in the above HPI.  OBGYN History: As per HPI. OB History  Gravida Para Term Preterm AB Living  12 7 4 3 5 6   SAB IAB Ectopic Multiple Live Births  5   0 6    # Outcome Date GA Lbr Len/2nd Weight Sex Type Anes PTL Lv  12 Preterm 12/24/24 [redacted]w[redacted]d  2820 g M CS-LTranv Spinal  LIV  11 SAB 08/2024          10 SAB 03/2024          9 Term 01/09/23 [redacted]w[redacted]d  3033 g F CS-LTranv  N LIV     Complications: Umbilical cord prolapse  8 Preterm 01/24/22 [redacted]w[redacted]d 14:40 / 00:12 2930 g F Vag-Spont EPI  LIV  7 SAB 2022             Birth Comments: states didn't go to doctor, had positive home  pregnancy test , then bled and passed tissue- didn't go to appt because of history of 2 sab  6 Preterm 12/28/19 [redacted]w[redacted]d 08:08 / 00:05 2790 g M Vag-Spont EPI  LIV     Complications: Eclampsia  5 Term 11/08/18 [redacted]w[redacted]d 00:15 / 00:18 3275 g M Vag-Spont EPI  LIV     Birth Comments: WNL  4 Term 06/23/17 [redacted]w[redacted]d  2948 g F Vag-Spont EPI N LIV     Birth Comments: baby had cardiac defect, otherwise wnl  3 SAB 07/2014 [redacted]w[redacted]d            Birth Comments: had D&C  2 Term 2014 [redacted]w[redacted]d       FD      Birth Comments: stillbirth  1 SAB 2014 [redacted]w[redacted]d          Past Medical History: Past Medical History:  Diagnosis Date   ADHD (attention deficit hyperactivity disorder)    Adopted person 10/04/2019   Does not know personal family history other than Diabetes, Breast Cancer. Raised in foster care   Allergy    Anxiety    Asthma    Epilepsy (HCC)    Headache(784.0)    History of eclampsia    History of pre-eclampsia    Kidney stone    kidney stones   Migraines    ODD (oppositional defiant disorder)    PID (acute pelvic inflammatory disease) 01/29/2016   PTSD (post-traumatic stress disorder)    Past Surgical History: Past Surgical History:  Procedure Laterality Date   CESAREAN SECTION  01/09/2023   CESAREAN SECTION WITH BILATERAL TUBAL LIGATION N/A 12/24/2024   Procedure: CESAREAN SECTION, WITH BILATERAL TUBAL LIGATION;  Surgeon: Fredirick Glenys RAMAN, MD;  Location: [facility] LD ORS;  Service: Obstetrics;  Laterality: N/A;   DILATION AND CURETTAGE OF UTERUS     kidney stone removed  03/06/2024   right knee surgery     stent in left kidney  02/20/2024   TONSILLECTOMY AND ADENOIDECTOMY     Family History:  Family History  Adopted: Yes  Problem Relation Age of Onset   Diabetes Sister    Other Daughter        born with hole in heart   Other Son        born with hole in heart   Breast cancer Maternal Grandmother    Diabetes Maternal Grandmother    Glaucoma Maternal Grandmother    Epilepsy Maternal Grandmother    Social History:  Social History   Socioeconomic History   Marital status: Married    Spouse name: Not on file   Number of children: 2   Years of education: Not on file   Highest education level: 11th grade  Occupational History   Occupation: disabled has epilepsy   Tobacco Use   Smoking status: Former    Current packs/day: 0.00    Average packs/day: 0.5 packs/day    Types: Cigarettes, E-cigarettes    Quit date: 04/30/2021    Years since quitting: 3.6   Smokeless tobacco:  Never   Tobacco comments:    nicotine  patch  Vaping Use   Vaping status: Former   Substances: CBD  Substance and Sexual Activity   Alcohol use: Not Currently    Comment: drinks beer on weekends when not pregnant   Drug use: Not Currently    Types: Marijuana, Cocaine    Comment: Not since 2018   Sexual activity: Yes    Birth control/protection: None  Other Topics Concern   Not on file  Social History Narrative   Not on file   Social Drivers of Health   Tobacco Use: Medium Risk (12/24/2024)   Patient History    Smoking Tobacco Use: Former    Smokeless Tobacco Use: Never    Passive Exposure: Not on file  Financial Resource Strain: Low Risk (10/05/2024)   Received from [facility]   Overall Financial Resource Strain (CARDIA)    How hard is it for you to pay for the very basics like food, housing, medical care, and heating?: Not very hard  Food Insecurity: Patient Declined (12/24/2024)   Epic    Worried About Programme Researcher, Broadcasting/film/video in the Last Year: Patient declined    Barista in the Last Year: Patient declined  Transportation Needs: Patient Declined (12/24/2024)   Epic    Lack of Transportation (Medical): Patient declined    Lack of Transportation (Non-Medical): Patient declined  Physical Activity: Inactive (07/20/2024)   Exercise Vital Sign    Days of Exercise per Week: 0 days    Minutes of Exercise per Session: 0 min  Stress: Stress Concern Present (07/20/2024)   [employer] of Occupational Health - Occupational Stress Questionnaire    Feeling of Stress: Rather much  Social Connections: Patient Declined (12/24/2024)   Social Connection and Isolation Panel    Frequency of Communication with Friends and Family: Patient declined    Frequency of Social Gatherings with Friends and Family: Patient declined    Attends Religious Services: Patient declined    Database Administrator or Organizations: Patient declined    Attends Banker Meetings: Patient  declined    Marital Status: Patient declined  Catering Manager  Violence: Patient Declined (12/24/2024)   Epic    Fear of Current or Ex-Partner: Patient declined    Emotionally Abused: Patient declined    Physically Abused: Patient declined    Sexually Abused: Patient declined  Depression (PHQ2-9): Low Risk (07/20/2024)   Depression (PHQ2-9)    PHQ-2 Score: 0  Alcohol Screen: Low Risk (07/20/2024)   Alcohol Screen    Last Alcohol Screening Score (AUDIT): 0  Housing: Patient Declined (12/24/2024)   Epic    Unable to Pay for Housing in the Last Year: Patient declined    Number of Times Moved in the Last Year: Not on file    Homeless in the Last Year: Patient declined  Utilities: Patient Declined (12/24/2024)   Epic    Threatened with loss of utilities: Patient declined  Health Literacy: Not on file    Allergy: Allergies[1] Current Outpatient Medications: Medications Prior to Admission  Medication Sig Dispense Refill Last Dose/Taking   ALBUTEROL  SULFATE IN Inhale into the lungs.      EPINEPHrine  0.3 mg/0.3 mL IJ SOAJ injection Inject 0.3 mg into the muscle as needed for anaphylaxis. 1 each 2    ibuprofen  (ADVIL ) 600 MG tablet Take 1 tablet (600 mg total) by mouth every 6 (six) hours. 30 tablet 0    lamoTRIgine  (LAMICTAL ) 100 MG tablet TAKE 1 TABLET BY MOUTH TWICE A DAY 60 tablet 6    levETIRAcetam  (KEPPRA ) 750 MG tablet TAKE 2 TABLETS (1,500 MG TOTAL) BY MOUTH 2 (TWO) TIMES DAILY. 360 tablet 1    NIFEdipine  (ADALAT  CC) 30 MG 24 hr tablet Take 1 tablet (30 mg total) by mouth daily. 90 tablet 0    oxyCODONE  (OXY IR/ROXICODONE ) 5 MG immediate release tablet Take 1-2 tablets (5-10 mg total) by mouth every 4 (four) hours as needed for moderate pain (pain score 4-6). 20 tablet 0    Prenatal Vit-Fe Fumarate-FA (PRENATAL VITAMIN PO) Take 1 capsule by mouth daily.      venlafaxine  XR (EFFEXOR -XR) 75 MG 24 hr capsule Take 75 mg by mouth daily.      Physical Exam:   Patient Vitals for the past 24  hrs:  BP Temp Temp src Pulse Resp SpO2 Height Weight  12/27/24 2215 (!) 138/93 98.6 F (37 C) Oral (!) 104 19 99 % 5' 6 (1.676 m) 112.7 kg    Body mass index is 40.09 kg/m. General appearance: Well nourished, well developed female in no acute distress.  Cardiovascular: S1, S2 normal, no murmur, rub or gallop, regular rate and rhythm Respiratory:  Clear to auscultation bilateral. Normal respiratory effort. Chest nttp Abdomen: +BS, obese, soft, nttp. C/d/I dressing Neuro/Psych:  Normal mood and affect.  Skin:  Warm and dry.  Extremities: 2+ hand and b/l edema  Laboratory: None  Imaging:  none  Assessment: patient stable  Plan: Cbc, cmp, ordered, as well as fioricet  and another dose of procardia  30 xl. Pt not on lasix  or kdur during hospitalization or at discharge. Lasix  40 po x 1 and kdur ordered. Low suspicion for PE, MI.   Bebe Izell Raddle MD Attending Center for [employer] (Faculty Practice) 12/27/2024 Time: 2245   Total time taking care of the patient was *** minutes, with greater than 50% of the time spent in face to face interaction with the patient.***     [1]  Allergies Allergen Reactions   Bee Venom Anaphylaxis   Ceftriaxone  Other (See Comments), Hives and Itching   Penicillins Anaphylaxis, Dermatitis, Hives and Itching    *  Has tolerated several cephalosporins*  Has patient had a PCN reaction causing immediate rash, facial/tongue/throat swelling, SOB or lightheadedness with hypotension: Unknown  Has patient had a PCN reaction causing severe rash involving mucus membranes or skin necrosis: Unknown  Has patient had a PCN reaction that required hospitalization: Unknown  Has patient had a PCN reaction occurring within the last 10 years: Unknown  If all of the above answers are NO, then may proceed with Cephalosporin use.  Has patient had a PCN reaction causing immediate rash, facial/tongue/throat swelling, SOB or lightheadedness with  hypotension: Unknown    Has patient had a PCN reaction causing severe rash involving mucus membranes or skin necrosis: Unknown    Has patient had a PCN reaction that required hospitalization: Unknown    Has patient had a PCN reaction occurring within the last 10 years: Unknown    If all of the above answers are NO, then may proceed with Cephalosporin use.    * Has tolerated several cephalosporins* Has patient had a PCN reaction causing immediate rash, facial/tongue/throat swelling, SOB or lightheadedness with hypotension: Unknown Has patient had a PCN reaction causing severe rash involving mucus membranes or skin necrosis: Unknown Has patient had a PCN reaction that required hospitalization: Unknown Has patient had a PCN reaction occurring within the last 10 years: Unknown If all of the above answers are NO, then may proceed with Cephalosporin use.    Has tolerated cephalosporins   Lorazepam  Other (See Comments)    Aggressive-agitation   Prednisone  Other (See Comments)    Bleeding nose bleeding and internal bleeding  Bleeding nose bleeding and internal bleeding    Nose bleed    Other reaction(s): Other (See Comments) weakness    Nose bleed , weakness    Unknown to pt, reconciled from outside source   Aztreonam Itching   Latex Rash

## 2024-12-28 DIAGNOSIS — R7401 Elevation of levels of liver transaminase levels: Secondary | ICD-10-CM | POA: Diagnosis present

## 2024-12-28 LAB — COMPREHENSIVE METABOLIC PANEL WITH GFR
ALT: 34 U/L (ref 0–44)
AST: 55 U/L — ABNORMAL HIGH (ref 15–41)
Albumin: 3.3 g/dL — ABNORMAL LOW (ref 3.5–5.0)
Alkaline Phosphatase: 112 U/L (ref 38–126)
Anion gap: 13 (ref 5–15)
BUN: 9 mg/dL (ref 6–20)
CO2: 24 mmol/L (ref 22–32)
Calcium: 8.7 mg/dL — ABNORMAL LOW (ref 8.9–10.3)
Chloride: 103 mmol/L (ref 98–111)
Creatinine, Ser: 0.53 mg/dL (ref 0.44–1.00)
GFR, Estimated: >60 mL/min
Glucose, Bld: 90 mg/dL (ref 70–99)
Potassium: 3.5 mmol/L (ref 3.5–5.1)
Sodium: 140 mmol/L (ref 135–145)
Total Bilirubin: <0.2 mg/dL (ref 0.0–1.2)
Total Protein: 6.3 g/dL — ABNORMAL LOW (ref 6.5–8.1)

## 2024-12-28 MED ORDER — POTASSIUM CHLORIDE CRYS ER 20 MEQ PO TBCR: 20.0000 meq | EXTENDED_RELEASE_TABLET | Freq: Every day | ORAL | 0 refills | Status: AC

## 2024-12-28 MED ORDER — IBUPROFEN 600 MG PO TABS: 600.0000 mg | ORAL_TABLET | Freq: Four times a day (QID) | ORAL | Status: AC | PRN

## 2024-12-28 MED ORDER — FUROSEMIDE 20 MG PO TABS: 20.0000 mg | ORAL_TABLET | Freq: Every day | ORAL | 0 refills | Status: AC

## 2024-12-28 MED ORDER — NIFEDIPINE ER 30 MG PO TB24: 30.0000 mg | ORAL_TABLET | Freq: Two times a day (BID) | ORAL | Status: AC

## 2024-12-29 NOTE — Lactation Note (Signed)
 This note was copied from a baby's chart.  NICU Lactation Consultation Note  Patient Name: Erin Krueger Date: 12/29/2024 Age:28 days  Reason for consult: Follow-up assessment; Other (Comment); NICU baby; 1st time breastfeeding; Late-preterm 34-36.6wks (GHTN) Type of Endocrine Disorder?: Diabetes (GDM)  SUBJECTIVE  P7 Mom of baby Erin Krueger phoned in for lactation assistance.  LC returned her phone call.  This is Mom's first time breastfeeding and she had concerns.  She has been using a hands free pump at home and expressing 20 ml.  Mom concerned about her use of Oxycodone  while pumping.  Mom states she usually just takes one at night, but has been using ibuprofen  more now.  Reassured Mom that if she is only taking the medication once a day, she should save her milk and label it and bring it to the NICU.  Mom is hoping to come tomorrow and she was encouraged to ask for lactation at that time.  Mom also states she has WIC [facility]).  LC sent a [facility] referral and shared that she would be able to get a [facility] loaner from us  for $30 refundable deposit, for 2 weeks.  Mom was very happy to hear that.    Mom encouraged to pump every 3 hrs, she sets an alarm she says.  No engorgement C/O currently.  OBJECTIVE Infant data: No data recorded O2 Device: HHFNC O2 Flow Rate (L/min): 2 L/min FiO2 (%): 21 %  Infant feeding assessment IDFS - Readiness: 2   Maternal data: [id number] C-Section, Low Transverse Pumping frequency: 4-6 times per 24 hrs Pumped volume: 20 mL Flange Size: 18  WIC Program: Yes WIC Referral Sent?: Yes What county?: [place] Pump: Personal, Hands Free (Mom Cozy through insurance)  ASSESSMENT Infant:  Feeding Status: Scheduled 8-11-2-5 Feeding method: Tube/Gavage (Bolus)  Maternal: Milk volume: Low  INTERVENTIONS/PLAN Interventions: Tools: Pump; Flanges  Plan: Consult Status: NICU follow-up NICU Follow-up type: Verify DEBP issuance; Verify absence of  engorgement; Verify onset of copious milk   Erin Krueger 12/29/2024, 6:01 PM

## 2025-01-02 ENCOUNTER — Other Ambulatory Visit

## 2025-01-02 ENCOUNTER — Encounter: Admitting: Obstetrics & Gynecology

## 2025-01-03 ENCOUNTER — Ambulatory Visit: Admitting: Women's Health

## 2025-01-09 ENCOUNTER — Other Ambulatory Visit

## 2025-01-09 ENCOUNTER — Encounter: Admitting: Obstetrics & Gynecology

## 2025-01-16 ENCOUNTER — Encounter: Admitting: Women's Health

## 2025-01-16 ENCOUNTER — Other Ambulatory Visit

## 2025-01-23 ENCOUNTER — Other Ambulatory Visit: Admitting: Radiology

## 2025-01-23 ENCOUNTER — Encounter: Admitting: Women's Health

## 2025-01-29 ENCOUNTER — Inpatient Hospital Stay (HOSPITAL_COMMUNITY): Admit: 2025-01-29

## 2025-02-07 ENCOUNTER — Ambulatory Visit: Admitting: Women's Health
